# Patient Record
Sex: Female | Born: 1950
Health system: Southern US, Community
[De-identification: ages and names within clinical notes are randomized; demographics above are authoritative.]

## PROBLEM LIST (undated history)

## (undated) DIAGNOSIS — I2510 Atherosclerotic heart disease of native coronary artery without angina pectoris: Secondary | ICD-10-CM

## (undated) DIAGNOSIS — I700 Atherosclerosis of aorta: Secondary | ICD-10-CM

## (undated) DIAGNOSIS — K449 Diaphragmatic hernia without obstruction or gangrene: Secondary | ICD-10-CM

## (undated) DIAGNOSIS — Z923 Personal history of irradiation: Secondary | ICD-10-CM

## (undated) DIAGNOSIS — C801 Malignant (primary) neoplasm, unspecified: Secondary | ICD-10-CM

## (undated) DIAGNOSIS — I340 Nonrheumatic mitral (valve) insufficiency: Secondary | ICD-10-CM

## (undated) DIAGNOSIS — K219 Gastro-esophageal reflux disease without esophagitis: Secondary | ICD-10-CM

## (undated) DIAGNOSIS — R0609 Other forms of dyspnea: Secondary | ICD-10-CM

## (undated) DIAGNOSIS — C50919 Malignant neoplasm of unspecified site of unspecified female breast: Secondary | ICD-10-CM

## (undated) DIAGNOSIS — I959 Hypotension, unspecified: Secondary | ICD-10-CM

## (undated) DIAGNOSIS — Z853 Personal history of malignant neoplasm of breast: Secondary | ICD-10-CM

## (undated) DIAGNOSIS — Z8719 Personal history of other diseases of the digestive system: Secondary | ICD-10-CM

## (undated) DIAGNOSIS — E785 Hyperlipidemia, unspecified: Secondary | ICD-10-CM

## (undated) DIAGNOSIS — F410 Panic disorder [episodic paroxysmal anxiety] without agoraphobia: Secondary | ICD-10-CM

## (undated) DIAGNOSIS — E7800 Pure hypercholesterolemia, unspecified: Secondary | ICD-10-CM

## (undated) DIAGNOSIS — M8580 Other specified disorders of bone density and structure, unspecified site: Secondary | ICD-10-CM

## (undated) DIAGNOSIS — J40 Bronchitis, not specified as acute or chronic: Secondary | ICD-10-CM

## (undated) HISTORY — DX: Gastro-esophageal reflux disease without esophagitis: K21.9

## (undated) HISTORY — DX: Nonrheumatic mitral (valve) insufficiency: I34.0

## (undated) HISTORY — DX: Personal history of malignant neoplasm of breast: Z85.3

## (undated) HISTORY — DX: Atherosclerotic heart disease of native coronary artery without angina pectoris: I25.10

## (undated) HISTORY — DX: Other forms of dyspnea: R06.09

## (undated) HISTORY — DX: Malignant (primary) neoplasm, unspecified: C80.1

## (undated) HISTORY — DX: Hyperlipidemia, unspecified: E78.5

---

## 1995-01-26 DIAGNOSIS — C50919 Malignant neoplasm of unspecified site of unspecified female breast: Secondary | ICD-10-CM

## 1995-01-26 DIAGNOSIS — C801 Malignant (primary) neoplasm, unspecified: Secondary | ICD-10-CM

## 1995-01-26 HISTORY — PX: BREAST EXCISIONAL BIOPSY: SUR124

## 1995-01-26 HISTORY — DX: Malignant neoplasm of unspecified site of unspecified female breast: C50.919

## 1995-01-26 HISTORY — PX: BREAST SURGERY: SHX581

## 1995-01-26 HISTORY — DX: Malignant (primary) neoplasm, unspecified: C80.1

## 2003-11-04 ENCOUNTER — Ambulatory Visit: Payer: Self-pay | Admitting: Oncology

## 2003-11-26 ENCOUNTER — Ambulatory Visit: Payer: Self-pay | Admitting: Oncology

## 2004-02-12 ENCOUNTER — Ambulatory Visit: Payer: Self-pay | Admitting: General Surgery

## 2004-03-09 ENCOUNTER — Ambulatory Visit: Payer: Self-pay | Admitting: General Surgery

## 2004-03-09 LAB — HM COLONOSCOPY

## 2004-10-29 ENCOUNTER — Ambulatory Visit: Payer: Self-pay | Admitting: Oncology

## 2004-11-25 ENCOUNTER — Ambulatory Visit: Payer: Self-pay | Admitting: Oncology

## 2005-01-27 ENCOUNTER — Ambulatory Visit: Payer: Self-pay | Admitting: General Surgery

## 2005-02-18 ENCOUNTER — Other Ambulatory Visit: Payer: Self-pay

## 2005-02-18 ENCOUNTER — Emergency Department: Payer: Self-pay | Admitting: General Practice

## 2005-10-22 ENCOUNTER — Ambulatory Visit: Payer: Self-pay | Admitting: Oncology

## 2005-10-25 ENCOUNTER — Ambulatory Visit: Payer: Self-pay | Admitting: Oncology

## 2006-01-25 HISTORY — PX: COLONOSCOPY: SHX174

## 2006-01-26 ENCOUNTER — Ambulatory Visit: Payer: Self-pay | Admitting: General Surgery

## 2007-02-03 ENCOUNTER — Ambulatory Visit: Payer: Self-pay | Admitting: General Surgery

## 2007-06-09 ENCOUNTER — Inpatient Hospital Stay: Payer: Self-pay | Admitting: Unknown Physician Specialty

## 2007-06-11 ENCOUNTER — Other Ambulatory Visit: Payer: Self-pay

## 2008-02-05 ENCOUNTER — Ambulatory Visit: Payer: Self-pay | Admitting: General Surgery

## 2008-02-08 ENCOUNTER — Ambulatory Visit: Payer: Self-pay | Admitting: General Surgery

## 2009-02-05 ENCOUNTER — Ambulatory Visit: Payer: Self-pay | Admitting: General Surgery

## 2010-02-09 ENCOUNTER — Ambulatory Visit: Payer: Self-pay | Admitting: General Surgery

## 2010-03-06 ENCOUNTER — Ambulatory Visit: Payer: Self-pay

## 2010-08-05 ENCOUNTER — Ambulatory Visit: Payer: Self-pay

## 2011-02-11 ENCOUNTER — Ambulatory Visit: Payer: Self-pay | Admitting: General Surgery

## 2012-02-14 ENCOUNTER — Ambulatory Visit: Payer: Self-pay | Admitting: General Surgery

## 2012-02-28 ENCOUNTER — Emergency Department: Payer: Self-pay | Admitting: Emergency Medicine

## 2012-02-28 LAB — COMPREHENSIVE METABOLIC PANEL
Alkaline Phosphatase: 95 U/L (ref 50–136)
Anion Gap: 11 (ref 7–16)
Creatinine: 0.75 mg/dL (ref 0.60–1.30)
EGFR (African American): >60
Osmolality: 272 (ref 275–301)
Potassium: 4.1 mmol/L (ref 3.5–5.1)
Sodium: 136 mmol/L (ref 136–145)

## 2012-02-28 LAB — TSH: Thyroid Stimulating Horm: 1.63 u[IU]/mL

## 2012-02-28 LAB — URINALYSIS, COMPLETE
Bilirubin,UR: NEGATIVE
Blood: NEGATIVE
Nitrite: NEGATIVE
Ph: 6 (ref 4.5–8.0)
RBC,UR: 1 /HPF (ref 0–5)
Specific Gravity: 1.005 (ref 1.003–1.030)
Squamous Epithelial: 2
WBC UR: 1 /HPF (ref 0–5)

## 2012-02-28 LAB — CBC
HCT: 43.4 % (ref 35.0–47.0)
MCHC: 33.5 g/dL (ref 32.0–36.0)
RDW: 13.1 % (ref 11.5–14.5)

## 2012-02-28 LAB — DRUG SCREEN, URINE
Amphetamines, Ur Screen: NEGATIVE (ref ?–1000)
Cannabinoid 50 Ng, Ur ~~LOC~~: NEGATIVE (ref ?–50)
Cocaine Metabolite,Ur ~~LOC~~: NEGATIVE (ref ?–300)
MDMA (Ecstasy)Ur Screen: NEGATIVE (ref ?–500)
Opiate, Ur Screen: NEGATIVE (ref ?–300)
Tricyclic, Ur Screen: NEGATIVE (ref ?–1000)

## 2012-02-28 LAB — ETHANOL
Ethanol %: <0.003 % (ref 0.000–0.080)
Ethanol: <3 mg/dL

## 2012-03-02 ENCOUNTER — Inpatient Hospital Stay: Payer: Self-pay | Admitting: Psychiatry

## 2012-03-02 LAB — COMPREHENSIVE METABOLIC PANEL
Alkaline Phosphatase: 104 U/L (ref 50–136)
Anion Gap: 7 (ref 7–16)
BUN: 6 mg/dL — ABNORMAL LOW (ref 7–18)
Bilirubin,Total: 0.8 mg/dL (ref 0.2–1.0)
Calcium, Total: 9.7 mg/dL (ref 8.5–10.1)
Chloride: 103 mmol/L (ref 98–107)
Co2: 27 mmol/L (ref 21–32)
EGFR (African American): >60
EGFR (Non-African Amer.): >60
Osmolality: 272 (ref 275–301)
SGOT(AST): 27 U/L (ref 15–37)
Sodium: 137 mmol/L (ref 136–145)
Total Protein: 9 g/dL — ABNORMAL HIGH (ref 6.4–8.2)

## 2012-03-02 LAB — DRUG SCREEN, URINE
Amphetamines, Ur Screen: NEGATIVE
Barbiturates, Ur Screen: NEGATIVE
Benzodiazepine, Ur Scrn: NEGATIVE
Cannabinoid 50 Ng, Ur ~~LOC~~: NEGATIVE
Cocaine Metabolite,Ur ~~LOC~~: NEGATIVE
MDMA (Ecstasy)Ur Screen: NEGATIVE
Methadone, Ur Screen: NEGATIVE
Opiate, Ur Screen: NEGATIVE
Phencyclidine (PCP) Ur S: NEGATIVE
Tricyclic, Ur Screen: NEGATIVE

## 2012-03-02 LAB — CBC
HCT: 46.7 %
HGB: 15.9 g/dL
MCH: 30.5 pg
MCHC: 34 g/dL
MCV: 90 fL
Platelet: 414 x10 3/mm 3
RBC: 5.21 X10 6/mm 3 — ABNORMAL HIGH
RDW: 13.2 %
WBC: 9.1 x10 3/mm 3

## 2012-03-02 LAB — URINALYSIS, COMPLETE
Bilirubin,UR: NEGATIVE
Blood: NEGATIVE
Glucose,UR: NEGATIVE mg/dL
Ketone: NEGATIVE
Leukocyte Esterase: NEGATIVE
Nitrite: NEGATIVE
Ph: 6
Protein: NEGATIVE
RBC,UR: NONE SEEN /HPF
Specific Gravity: 1.001
Squamous Epithelial: NONE SEEN
WBC UR: NONE SEEN /HPF

## 2012-03-02 LAB — ACETAMINOPHEN LEVEL: Acetaminophen: <2 ug/mL

## 2012-03-02 LAB — ETHANOL
Ethanol %: <0.003 % (ref 0.000–0.080)
Ethanol: <3 mg/dL

## 2012-03-02 LAB — TSH: Thyroid Stimulating Horm: 1.9 u[IU]/mL

## 2012-05-23 LAB — HM PAP SMEAR

## 2012-07-26 ENCOUNTER — Encounter: Payer: Self-pay | Admitting: *Deleted

## 2013-02-27 ENCOUNTER — Ambulatory Visit: Payer: Self-pay | Admitting: General Surgery

## 2013-02-28 ENCOUNTER — Encounter: Payer: Self-pay | Admitting: General Surgery

## 2013-03-12 ENCOUNTER — Encounter: Payer: Self-pay | Admitting: General Surgery

## 2013-03-12 ENCOUNTER — Ambulatory Visit (INDEPENDENT_AMBULATORY_CARE_PROVIDER_SITE_OTHER): Payer: Federal, State, Local not specified - PPO | Admitting: General Surgery

## 2013-03-12 VITALS — BP 118/76 | HR 76 | Resp 12 | Ht 66.0 in | Wt 194.0 lb

## 2013-03-12 DIAGNOSIS — Z853 Personal history of malignant neoplasm of breast: Secondary | ICD-10-CM | POA: Insufficient documentation

## 2013-03-12 NOTE — Patient Instructions (Signed)
Patient to return in 1 year with a Bilateral screening mammogram. Patient to continue self breast checks. Patient to call with any new questions or concerns.

## 2013-03-12 NOTE — Progress Notes (Signed)
Patient ID: Chelsey [name], female   DOB: 05-09-1950, 63 y.o.   MRN: [id number]  Chief Complaint  Patient presents with  . Follow-up    1 year follow up bilateral diagnostic mammogram. History of breast cancer    HPI Chelsey [name] is a 63 y.o. female who presents for a breast cancer follow up. The most recent mammogram was done on 02/27/13. She does do regular breast checks and gets regular mammograms. No new complaints at this time.   HPI  Past Medical History  Diagnosis Date  . GERD (gastroesophageal reflux disease)   . Personal history of malignant neoplasm of breast   . Hyperlipidemia   . Cancer 1997    right lumpectomy,L/Ad/R     Past Surgical History  Procedure Laterality Date  . Colonoscopy  2008  . Breast surgery Right 1997    lumpectomy    History reviewed. No pertinent family history.  Social History History  Substance Use Topics  . Smoking status: Former Smoker -- 5 years  . Smokeless tobacco: Never Used  . Alcohol Use: No    No Known Allergies  Current Outpatient Prescriptions  Medication Sig Dispense Refill  . alendronate (FOSAMAX) 70 MG tablet Take 1 tablet by mouth once a week.      Marland Kitchen aspirin 81 MG chewable tablet Chew 81 mg by mouth daily.      . Calcium Carbonate-Vitamin D (CALCIUM + D PO) Take 1 tablet by mouth daily.      . mirtazapine (REMERON) 30 MG tablet Take 1 tablet by mouth daily.      . Multiple Vitamin (MULTIVITAMIN) tablet Take 1 tablet by mouth daily.      Marland Kitchen omeprazole (PRILOSEC) 20 MG capsule Take 1 capsule by mouth daily.      . simvastatin (ZOCOR) 20 MG tablet Take 1 tablet by mouth daily.       No current facility-administered medications for this visit.    Review of Systems Review of Systems  Constitutional: Negative.   Respiratory: Negative.   Cardiovascular: Negative.     Blood pressure 118/76, pulse 76, resp. rate 12, height 5\' 6"  (1.676 m), weight 194 lb (87.998 kg).  Physical Exam Physical Exam  Constitutional:  She is oriented to person, place, and time. She appears well-developed and well-nourished.  Eyes: Conjunctivae are normal. No scleral icterus.  Neck: Neck supple. No thyromegaly present.  Cardiovascular: Normal rate, regular rhythm, normal heart sounds and normal pulses.   No murmur heard. Pulses:      Dorsalis pedis pulses are 2+ on the right side, and 2+ on the left side.       Posterior tibial pulses are 2+ on the left side.  No edema present. No stasis changes  Pulmonary/Chest: Effort normal and breath sounds normal. Right breast exhibits no inverted nipple, no mass, no nipple discharge, no skin change and no tenderness. Left breast exhibits no inverted nipple, no mass, no nipple discharge, no skin change and no tenderness.  Well healed right breast lumpectomy site.   Abdominal: Soft. Normal appearance and bowel sounds are normal. There is no hepatosplenomegaly. There is no tenderness. No hernia.  Lymphadenopathy:    She has no cervical adenopathy.    She has no axillary adenopathy.  Neurological: She is alert and oriented to person, place, and time. She has normal strength. No sensory deficit.  Skin: Skin is warm and dry.    Data Reviewed Mammogram-stable.  Assessment    History of right breast  cancer-1997, s/p lumpectomy, ax dissection and radiation. Stable exam.     Plan    1 yr follow up.        Chelsey [name] 03/12/2013, 9:52 AM

## 2013-03-15 ENCOUNTER — Encounter: Payer: Self-pay | Admitting: General Surgery

## 2013-05-31 ENCOUNTER — Emergency Department: Payer: Self-pay | Admitting: Emergency Medicine

## 2013-05-31 LAB — DRUG SCREEN, URINE

## 2013-05-31 LAB — COMPREHENSIVE METABOLIC PANEL
ALBUMIN: 3.5 g/dL (ref 3.4–5.0)
Alkaline Phosphatase: 89 U/L
Anion Gap: 6 — ABNORMAL LOW (ref 7–16)
BILIRUBIN TOTAL: 0.6 mg/dL (ref 0.2–1.0)
BUN: 15 mg/dL (ref 7–18)
CALCIUM: 8.9 mg/dL (ref 8.5–10.1)
CREATININE: 0.77 mg/dL (ref 0.60–1.30)
Chloride: 108 mmol/L — ABNORMAL HIGH (ref 98–107)
Co2: 24 mmol/L (ref 21–32)
GLUCOSE: 115 mg/dL — AB (ref 65–99)
OSMOLALITY: 277 (ref 275–301)
Potassium: 3.6 mmol/L (ref 3.5–5.1)
SGOT(AST): 23 U/L (ref 15–37)
SGPT (ALT): 18 U/L (ref 12–78)
Sodium: 138 mmol/L (ref 136–145)
TOTAL PROTEIN: 7.4 g/dL (ref 6.4–8.2)

## 2013-05-31 LAB — ACETAMINOPHEN LEVEL: Acetaminophen: <2 ug/mL

## 2013-05-31 LAB — SALICYLATE LEVEL: Salicylates, Serum: 4 mg/dL — ABNORMAL HIGH

## 2013-05-31 LAB — TSH: Thyroid Stimulating Horm: 1.43 u[IU]/mL

## 2013-05-31 LAB — CBC
HCT: 38.7 % (ref 35.0–47.0)
HGB: 12.4 g/dL (ref 12.0–16.0)
MCH: 27.6 pg (ref 26.0–34.0)
MCHC: 32.1 g/dL (ref 32.0–36.0)
MCV: 86 fL (ref 80–100)
Platelet: 288 10*3/uL (ref 150–440)
RBC: 4.51 10*6/uL (ref 3.80–5.20)
RDW: 15.8 % — ABNORMAL HIGH (ref 11.5–14.5)
WBC: 10.4 10*3/uL (ref 3.6–11.0)

## 2013-05-31 LAB — ETHANOL
Ethanol %: <0.003 % (ref 0.000–0.080)
Ethanol: <3 mg/dL

## 2013-11-26 ENCOUNTER — Encounter: Payer: Self-pay | Admitting: General Surgery

## 2014-02-28 ENCOUNTER — Encounter: Payer: Self-pay | Admitting: General Surgery

## 2014-02-28 ENCOUNTER — Ambulatory Visit: Payer: Self-pay | Admitting: General Surgery

## 2014-03-12 ENCOUNTER — Ambulatory Visit (INDEPENDENT_AMBULATORY_CARE_PROVIDER_SITE_OTHER): Payer: Federal, State, Local not specified - PPO | Admitting: General Surgery

## 2014-03-12 ENCOUNTER — Encounter: Payer: Self-pay | Admitting: General Surgery

## 2014-03-12 VITALS — BP 124/70 | HR 74 | Resp 12 | Ht 66.0 in | Wt 196.0 lb

## 2014-03-12 DIAGNOSIS — Z853 Personal history of malignant neoplasm of breast: Secondary | ICD-10-CM

## 2014-03-12 NOTE — Patient Instructions (Signed)
Patient will be asked to return to the office in one year with a bilateral screening mammogram. 

## 2014-03-12 NOTE — Progress Notes (Signed)
Patient ID: Chelsey [name], female   DOB: 09-19-1950, 64 y.o.   MRN: [id number]  Chief Complaint  Patient presents with  . Other    Mammmogram    HPI Chelsey [name] is a 63 y.o. female who presents for a breast cancer follow up. The most recent mammogram was done on 03/05/14. Marland Kitchen  Patient does perform regular self breast checks and gets regular mammograms done.  Few days ago she cut her left thumb at work. Wanted to heve me check it. No other problems in last yr  HPI  Past Medical History  Diagnosis Date  . GERD (gastroesophageal reflux disease)   . Personal history of malignant neoplasm of breast   . Hyperlipidemia   . Cancer 1997    right lumpectomy,L/Ad/R     Past Surgical History  Procedure Laterality Date  . Colonoscopy  2008  . Breast surgery Right 1997    lumpectomy    History reviewed. No pertinent family history.  Social History History  Substance Use Topics  . Smoking status: Former Smoker -- 5 years  . Smokeless tobacco: Never Used  . Alcohol Use: No    No Known Allergies  Current Outpatient Prescriptions  Medication Sig Dispense Refill  . alendronate (FOSAMAX) 70 MG tablet Take 1 tablet by mouth once a week.    Marland Kitchen aspirin 81 MG chewable tablet Chew 81 mg by mouth daily.    . Calcium Carbonate-Vitamin D (CALCIUM + D PO) Take 1 tablet by mouth daily.    . mirtazapine (REMERON) 30 MG tablet Take 1 tablet by mouth daily.    . Multiple Vitamin (MULTIVITAMIN) tablet Take 1 tablet by mouth daily.    Marland Kitchen omeprazole (PRILOSEC) 20 MG capsule Take 1 capsule by mouth daily.    . simvastatin (ZOCOR) 20 MG tablet Take 1 tablet by mouth daily.    . traZODone (DESYREL) 100 MG tablet Take 100 mg by mouth at bedtime.     No current facility-administered medications for this visit.    Review of Systems Review of Systems  Constitutional: Negative.   Respiratory: Negative.   Cardiovascular: Negative.     Blood pressure 124/70, pulse 74, resp. rate 12, height 5\' 6"   (1.676 m), weight 196 lb (88.905 kg).  Physical Exam Physical Exam  Constitutional: She is oriented to person, place, and time. She appears well-developed and well-nourished.  Eyes: Conjunctivae are normal. No scleral icterus.  Neck: Neck supple.  Cardiovascular: Normal rate, regular rhythm and normal heart sounds.   Pulmonary/Chest: Effort normal and breath sounds normal. Right breast exhibits no inverted nipple, no mass, no nipple discharge, no skin change and no tenderness. Left breast exhibits no inverted nipple, no mass, no nipple discharge, no skin change and no tenderness.  Right lumpectomy site well healed incision   Abdominal: Soft. Bowel sounds are normal. There is no tenderness.  Lymphadenopathy:    She has no cervical adenopathy.    She has no axillary adenopathy.  Neurological: She is alert and oriented to person, place, and time.  Skin: Skin is warm and dry.  Left thumb with <1cm curved clean cut- no sign of infection.   Data Reviewed Mammogram reviewed-stable  Assessment    Stable exam, History of right breast cancer-1997, s/p lumpectomy, ax dissection and radiation. Left thumb minor laceration-should heal ok. Pt advised.    Plan    Patient will be asked to return to the office in one year with a bilateral screening mammogram.  SANKAR,SEEPLAPUTHUR G 03/12/2014, 10:09 AM

## 2014-05-17 NOTE — Consult Note (Signed)
PATIENT NAME:  Chelsey [name], Chelsey [name] MR#:  [id number] DATE OF BIRTH:  [birth year]  DATE OF CONSULTATION:  03/02/2012  REQUESTING PHYSICIAN:  Lenise Arena CONSULTING PHYSICIAN:  Cordelia Pen. Gretel Acre, MD  REASON FOR CONSULTATION: Depression with suicidal ideation.  HISTORY OF PRESENT ILLNESS: The patient is a 64 year old widowed white female who presented to the Emergency Room as she was feeling depressed and reported that she was having suicidal thoughts and stated that she lined up her pills  to take but then she came here instead.  She stated that she has a strong family history of depression and her brother killed himself on her sister's birthday years ago. The patient reported that she has been having severe depressive symptoms for the past few days. She reported that she was unable to sleep for the past 1-1/2 weeks. Reported that her depressive symptoms are getting worse. She was feeling hopeless, helpless, with worsening of the anxiety. She was unable to eat. She also started having physical symptoms and was unable to concentrate. She went to see her primary care physician and she was started on the combination of Paxil 10 mg and was given lorazepam to help her sleep. However, there was no improvement and she went back to her primary care physician who started her on the Ambien. The Ambien caused worsening of the symptoms. The patient came to the ER on Monday and was advised to go back and continue taking her medications. The patient reported that she feels like crying but was unable to cry. Her depressive symptoms got worse, with worsening of depression with increase in the suicidal ideations. She reported that she started having panic attacks and she came to the hospital as she felt that there was no way out. She wanted to end it all. She wanted to cry. The patient reported that she was seeking voluntary administration for mood stabilization and for safety.   PAST PSYCHIATRIC HISTORY: The patient stated  that she has history of previous hospitalization in 2009. At that time she was also was very depressed with feelings of hopelessness and helplessness. She was started on the combination of Remeron and Seroquel. She reported that she felt better on the combination of the medications, but after her discharge, she started seeing a female psychiatrist in [place] who gradually tapered her out of the medication. She reported that she has never tried to hurt herself.   MEDICAL HISTORY: The patient has history of hypercholesterolemia, as well as GERD.  PAST FAMILY HISTORY: The patient has history of suicide in her brother who killed himself on her sister's birthday.  Her aunt also killed herself. Her other brother has also attempted suicide.   SOCIAL HISTORY: The patient has a college degree and is currently widowed. She reported that her husband died due to carcinoma. She has 2 sons. Reported that her son is currently unemployed and she has to support him which is causing additional stress. The patient reported that her mother passed away recently. The patient stopped smoking but recently picked up a week ago. She denied using alcohol or other drugs.  CURRENT MEDICATIONS: Prilosec 20 mg daily, Boniva on a weekly basis.  PHYSICAL EXAMINATION: VITAL SIGNS:  Temperature 97.9, pulse 115, respirations 18, blood pressure 141/80.   Glucose 106, BUN 6, creatinine 0.77, sodium 137, potassium 4.2, chloride 103, bicarbonate 227, anion gap 7, osmolality 272, blood alcohol level less than 3. Protein 9.0, albumin 4.5, bilirubin is 0.8, alkaline phosphatase 104, AST 27, ALT 31. Thyroid stimulating hormone  1.9. Urine drug screen was negative. WBC 9.1, RBC 5.21, hemoglobin 15.9, hematocrit 46.7, platelet count 414, MCV 90, MCHC 34, RDW 13.2.  REVIEW OF SYSTEMS:  CONSTITUTIONAL: No fever or chills. No weight changes.  EYES:  No double or blurred vision.  RESPIRATORY:  No shortness of breath or cough.  CARDIOVASCULAR: No  chest pain, orthopnea. GASTROINTESTINAL  No abdominal pain, nausea, vomiting or diarrhea.  GENITOURINARY:  No incontinence or frequency.  ENDOCRINE: No heat or cold intolerance.  LYMPHATIC:  No anemia or easy bruising.  INTEGUMENTARY:  No acne or rash.  MUSCULOSKELETAL:  No pain or muscle pain.  NEUROLOGIC:  No tingling or weakness.   MSE: A moderately built female who appeared her stated age. She appeared somewhat anxious. Her speech was low in tone and volume. Mood was depressed. Affect was congruent. Thought process was logical and goal-directed. Thought content was nondelusional. She demonstrated fair insight and judgment. She has suicidal ideations with no specific plans  DIAGNOSTIC IMPRESSION: AXIS I:  Major depressive disorder, recurrent severe without psychotic features.   AXIS II:  None.   AXIS III:  Gastroesophageal reflux disease and hypercholesterolemia.   TREATMENT PLAN: 1.  The patient currently came to the hospital on voluntary admission to the behavioral health unit.  2.  She will be started on Remeron 15 mg p.o. at bedtime.  She agreed with the treatment plan. She will be continued on her other medications. Treatment team to follow.   Thank you for allowing me to participate in the care of this patient.      ____________________________ Cordelia Pen. Gretel Acre, MD usf:ce D: 03/02/2012 14:43:36 ET T: 03/02/2012 17:14:33 ET JOB#: [id number]  cc: Cordelia Pen. Gretel Acre, MD, <Dictator> Jeronimo Norma MD ELECTRONICALLY SIGNED 03/07/2012 9:39

## 2014-05-17 NOTE — H&P (Signed)
PATIENT NAME:  Chelsey [name], Chelsey [name] MR#:  [id number] DATE OF BIRTH:  August 06, 1950  DATE OF ADMISSION:  03/02/2012  DATE OF ASSESSMENT: 03/03/2012   REFERRING PHYSICIAN: Algis Liming. Jimmye Norman, MD  ACCEPTING PHYSICIAN: Cordelia Pen. Gretel Acre, MD  ATTENDING PHYSICIAN: Jolanta B. Pucilowska, MD  IDENTIFYING DATA: The patient is a 64 year old female with a history of depression.   CHIEF COMPLAINT: "I've been very depressed."   HISTORY OF PRESENT ILLNESS: The patient has a long history of depression. She was hospitalized at [facility] in 2009 for depression. For a long while, she continued on medications prescribed by her primary provider. Lately it was Paxil. Very gradually, the Paxil was discontinued. The patient did not have symptoms of Paxil withdrawal. However, with time she became increasingly depressed, and in the past few days she has been insomniac, depressed with passing thoughts of suicide without any intention or a plan. She has lost her appetite. She has not been able to concentrate and do her regular job of delivering mail. Last week, she went back to her primary provider who restarted the patient on Paxil 10 mg. The patient did start taking it. She denies psychotic symptoms. She denies symptoms suggestive of bipolar mania. There is no alcohol, illicit drug or prescription pill abuse. She endorsed many symptoms of anxiety along with symptoms of depression. She was prescribed at some point Ativan 0.5 mg at night for sleep.   PAST PSYCHIATRIC HISTORY: As above, she was hospitalized at [facility] in 2009. She was treated with a combination of Remeron and Celexa. She remembers good response to Paxil. Her chart discloses that she at some point was treated with Seroquel, and Seroquel in combination with the Remeron was working well. The patient is unable to identify stressors except for the fact that her son lost his job and now she is trying to help him pay his bills.    FAMILY PSYCHIATRIC HISTORY: There is a long history of depression in the family with several suicides. The patient's brother committed suicide on her sister's birthday. There is also an aunt who killed herself. Her other brother attempted suicide.   PAST MEDICAL HISTORY: Dyslipidemia, GERD.   ALLERGIES: No known drug allergies.   MEDICATIONS ON ADMISSION: Prilosec 20 mg daily, simvastatin 20 mg at night, Paxil 10 mg daily, multivitamin daily, aspirin 81 mg daily, Boniva on a weekly basis.   SOCIAL HISTORY: The patient is widowed. 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 She likes her job. She lives alone. She is trying to help her son. She has 2 children. The other son is doing okay. She used to smoke and last week started smoking again to her surprise.   REVIEW OF SYSTEMS:  CONSTITUTIONAL: No fevers or chills. No weight changes.  EYES: No double or blurred vision.  ENT: No hearing loss.  RESPIRATORY: No shortness of breath or cough.  CARDIOVASCULAR: No chest pain or orthopnea.  GASTROINTESTINAL: No abdominal pain, nausea, vomiting or diarrhea.  GENITOURINARY: No incontinence or frequency.  ENDOCRINE: No heat or cold intolerance.  LYMPHATIC: No anemia or easy bruising.  INTEGUMENTARY: No acne or rash.  MUSCULOSKELETAL: No muscle or joint pain.  NEUROLOGIC: No tingling or weakness.  PSYCHIATRIC: See history of present illness for details.   PHYSICAL EXAMINATION:  VITAL SIGNS: Blood pressure 119/83, pulse 95, respirations 20, temperature 98.1.  GENERAL: This is a well developed elderly female in no acute distress.  HEENT: The pupils are equal, round and reactive to  light. Sclerae anicteric.  NECK: Supple. No thyromegaly.  LUNGS: Clear to auscultation. No dullness to percussion.  HEART: Regular rhythm and rate. No murmurs, rubs or gallops.  ABDOMEN: Soft, nontender, nondistended. Positive bowel sounds.  MUSCULOSKELETAL: Normal muscle strength in all extremities.  SKIN: No rashes or  bruises.  LYMPHATIC: No cervical adenopathy.  NEUROLOGIC: Cranial nerves II through XII are intact.   LABORATORY DATA: Chemistries are within normal limits except for blood glucose of 114. Blood alcohol level is zero. LFTs within normal limits. TSH 1.9. Urine tox screen negative for substances. CBC within normal limits. Urinalysis is not suggestive of urinary tract infection. Serum acetaminophen and salicylates are low.   MENTAL STATUS EXAMINATION ON ADMISSION: The patient is alert and oriented to person, place, time and situation. She is pleasant, polite and cooperative. She is well groomed and casually dressed. She maintains good eye contact. Her speech is soft. Mood is depressed with anxious affect. Thought processing is logical. Thought content: She denies suicidal or homicidal ideation but admits to passing thoughts of suicide in the past few days. She has never attempted suicide and does not want to do it but is in the hospital for help. There are no delusions or paranoia. There are no auditory or visual hallucinations. Her cognition is grossly intact. She registers 3 out of 3 and recalls 3 out of 3 objects after 5 minutes. She can have spell "world" forward and backward. She knows the current president. Her insight and judgment are fair.   SUICIDE RISK ASSESSMENT ON ADMISSION: This is a patient with a history of depression who became depressed and suicidal in the context of antidepressant discontinuation and social stressors.   DIAGNOSES:  AXIS I: Major depressive disorder, recurrent, severe. Anxiety disorder, not otherwise specified.  AXIS II: Deferred.  AXIS III: Dyslipidemia, gastroesophageal reflux disease, osteopenia.  AXIS IV: Mental illness, recent medication changes, financial problems.  AXIS V: Global Assessment of Functioning on admission: 25.   PLAN: The patient was admitted to [facility] for safety, stabilization and medication  management. She was initially placed on suicide precautions and was closely monitored for any unsafe behaviors. She underwent full psychiatric and risk assessment. She received pharmacotherapy, individual and group psychotherapy, substance abuse counseling, and support from therapeutic milieu.  1.  Suicidal ideation: The patient is able to contract for safety.  2.  Mood: The patient believes that she did the best on the Remeron at one point and Paxil another. Last time when she was hospitalized here, she was discharged on a combination of Remeron and SSRI. Dr. Gretel Acre restarted Remeron at 15 mg already. We will increase her Paxil from 10 to 30 mg daily.  2.  Severe insomnia: The patient receive a dose of temazepam night and she slept some. In the past few days, she was prescribed Ambien for sleep and she believes that it made things worse. 3.  Medical: We will continue medications for GERD and her dyslipidemia along with her vitamins.  4.  Disposition: She will be discharged to home. She sees a Teacher, music in [place] and would like to see someone locally. We will try to help her with this.   ____________________________ Herma Ard B. Bary Leriche, MD jbp:jm D: 03/03/2012 14:53:24 ET T: 03/03/2012 15:36:46 ET JOB#: [id number]  cc: Jolanta B. Bary Leriche, MD, <Dictator> Clovis Fredrickson MD ELECTRONICALLY SIGNED 03/23/2012 6:41

## 2014-05-17 NOTE — Consult Note (Signed)
Brief Consult Note: Diagnosis: MDD.   Patient was seen by consultant.   Consult note dictated.   Recommend further assessment or treatment.   Orders entered.   Comments: Pt is going to be admitted to [facility].  she is Voluntary and seeking help for worsening depression, SI with no plans.  Will start on Remeron 15mg  po qhs.  Treatment team to follow.  Electronic Signatures: Jeronimo Norma (MD)  (Signed 06-Feb-14 14:01)  Authored: Brief Consult Note   Last Updated: 06-Feb-14 14:01 by Jeronimo Norma (MD)

## 2014-05-18 NOTE — Consult Note (Signed)
PATIENT NAME:  Chelsey [name], Chelsey [name] MR#:  [id number] DATE OF BIRTH:  1950/12/10  DATE OF CONSULTATION:  05/31/2013  REFERRING PHYSICIAN:  Graciella Freer, MD CONSULTING PHYSICIAN:  Cordelia Pen. Gretel Acre, MD  REASON FOR CONSULTATION:  Depression, suicidal ideations.   HISTORY OF PRESENT ILLNESS: The patient is a 64 year old female who presented to the ER and was placed on involuntary commitment, as she was having depressive feelings. She reported that she was unable to sleep for the past 3 days. She reported that she was having panic, anxiety, and she was hospitalized last year. Reported that she has been following with [facility], after she was discharged from the hospital and was taking her medications as prescribed. However, [facility] stopped taking her insurance since December. She was stable on the combination of Remeron, trazodone and hydroxyzine on a p.r.n. basis. Initially, she was taking  hydroxyzine twice daily, but gradually she has decreased it only to a p.r.n. basis. However, since [facility] stopped taking her insurance, she went to her primary care physician at Dr. Rance Muir office, and they switched her medication to Celexa. She reported that it was a horrible experience and she was unable to sleep. Since February, she has never taken any of her psychotropic medications and is unable to sleep. She came in voluntarily to the ER, as she was feeling very depressed, hopeless and was feeling like she might start having thoughts to hurt herself. However, she is not having any of these symptoms at this time. She wanted help for her depression. She reported that if she will be started back on her medications, she will feel better. She currently denied having any suicidal or homicidal ideations or plans.   PAST PSYCHIATRIC HISTORY: The patient has long history of depression, and she was last hospitalized in 2014 and 2009. At that time, she was very depressed with feelings of hopelessness and helplessness. She did well on  the combination of Remeron, trazodone and hydroxyzine. She has also tried Seroquel in the past. She followed with a female psychiatrist in [place] for many years. She does not have any history of suicide in the past.   PAST MEDICAL HISTORY:  Hypercholesterolemia and GERD.   FAMILY HISTORY:  The patient has history of suicide in her brother who killed himself. Her aunt has also kill herself. Her older brother attempted suicide.   SOCIAL HISTORY:  The patient has a college degree and is currently widowed. She currently works as a Dispensing optician. She reported that she works around 9 to 9-1/2 hours. She does not use any drugs or alcohol at this time.   CURRENT MEDICATIONS:  Remeron 30 mg at bedtime, trazodone 100 mg at bedtime and hydroxyzine 25 mg b.i.d. p.r.n.; however, she has not taken these medications for at least 3 months.   PHYSICAL EXAMINATION: VITAL SIGNS: Temperature 98.6, pulse 86, respirations 20, blood pressure 145/79.   LABORATORY DATA: Glucose 115, BUN 15, creatinine 0.77, sodium 138, potassium 3.6, chloride 108, bicarbonate 24, anion gap 6, osmolality 277. Blood alcohol level less than 3. Protein 7.4, albumin 3.5, bilirubin 0.6, alkaline phosphatase 89, AST 23, ALT 18. TSH 1.43. UDS is negative. WBC 10.4, RBC 4.51, hemoglobin 12.4, MCV 86, RDW 15.8.   REVIEW OF SYSTEMS:  Negative, except as noted above.   MENTAL STATUS EXAMINATION: The patient is a moderately-built female who appeared her stated age. She was calm and cooperative during the interview. She maintained good eye contact. Her speech was normal in tone and volume. Thought process was  logical, goal directed. Thought content was nondelusional. No loose associations are noted. Her insight and judgment were fair. Recent and remote memory were intact. Attention span and concentration were normal. Language and fund of knowledge appears appropriate. Mood was depressed and affect was congruent. She denied having any suicidal or homicidal  ideation or plans.   DIAGNOSTIC IMPRESSION: AXIS I: 1.  Major depressive disorder, recurrent, moderate.  2.  Anxiety disorder, not otherwise specified.  AXIS II:  None.  AXIS III:  As noted above.   TREATMENT PLAN: 1.  The patient will be given a prescription of her current medication as follows: Remeron 30 mg p.o. at bedtime, trazodone 100 mg p.o. at bedtime and hydroxyzine 25 mg p.o. b.i.d. p.r.n. #60.  2.  She will be released from the involuntary commitment, as she does not have any thoughts to hurt herself and does not meet the criteria.  3.  She will follow up with the [facility] and will call to make a followup appointment. I discussed with the patient at length and advised her to come back for a followup appointment if she notices worsening of her symptoms, and she demonstrated understanding.   Thank you for allowing me to participate in the care of this patient.   ____________________________ Cordelia Pen. Gretel Acre, MD usf:dmm D: 05/31/2013 10:33:06 ET T: 05/31/2013 11:10:19 ET JOB#: [id number]  cc: Cordelia Pen. Gretel Acre, MD, <Dictator> Jeronimo Norma MD ELECTRONICALLY SIGNED 06/07/2013 10:57

## 2014-06-13 DIAGNOSIS — Z8639 Personal history of other endocrine, nutritional and metabolic disease: Secondary | ICD-10-CM | POA: Insufficient documentation

## 2014-06-13 DIAGNOSIS — K219 Gastro-esophageal reflux disease without esophagitis: Secondary | ICD-10-CM | POA: Insufficient documentation

## 2014-06-13 DIAGNOSIS — F331 Major depressive disorder, recurrent, moderate: Secondary | ICD-10-CM | POA: Insufficient documentation

## 2014-08-13 ENCOUNTER — Ambulatory Visit: Payer: Self-pay | Admitting: Psychiatry

## 2014-08-26 ENCOUNTER — Ambulatory Visit (INDEPENDENT_AMBULATORY_CARE_PROVIDER_SITE_OTHER): Payer: Self-pay | Admitting: Psychiatry

## 2014-08-26 ENCOUNTER — Encounter: Payer: Self-pay | Admitting: Psychiatry

## 2014-08-26 VITALS — BP 122/86 | HR 86 | Temp 98.2°F | Ht 66.0 in | Wt 186.2 lb

## 2014-08-26 DIAGNOSIS — F330 Major depressive disorder, recurrent, mild: Secondary | ICD-10-CM

## 2014-08-26 MED ORDER — MIRTAZAPINE 30 MG PO TABS: 30.0000 mg | ORAL_TABLET | Freq: Every day | ORAL | Status: DC

## 2014-08-26 MED ORDER — TRAZODONE HCL 100 MG PO TABS: 100.0000 mg | ORAL_TABLET | Freq: Every day | ORAL | Status: DC

## 2014-08-26 MED ORDER — HYDROXYZINE HCL 25 MG PO TABS: 25.0000 mg | ORAL_TABLET | Freq: Three times a day (TID) | ORAL | Status: DC

## 2014-08-26 NOTE — Progress Notes (Signed)
BH MD/PA/NP OP Progress Note  08/26/2014 1:13 PM Chelsey [name]  MRN:  [id number]  Subjective:  Patient is a 64 year old female who presented for the follow-up appointment. She reported that she is becoming more anxious as her 28 years old son has moved in to her home and she is applying for bankruptcy due to his issues. She reported that he has history of alcohol use in the past and has some legal charges pending due to the same. She was trying to pay off his balances and it led to financial difficulties. 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 She has been taking more of the Atarax to control her anxiety. She is also sleeping well with the help of trazodone. She reported that she has lost weight as she does not eat well when she becomes anxious. Patient currently denied having any suicidal homicidal ideations or plans. She reported that her other son is very stable and is working. She is trying to improve her condition by applying for bankruptcy and to help her son.   Chief Complaint:  Chief Complaint    Follow-up; Depression     Visit Diagnosis:     ICD-9-CM ICD-10-CM   1. MDD (major depressive disorder), recurrent episode, mild 296.31 F33.0     Past Medical History:  Past Medical History  Diagnosis Date  . GERD (gastroesophageal reflux disease)   . Personal history of malignant neoplasm of breast   . Hyperlipidemia   . Cancer 1997    right lumpectomy,L/Ad/R     Past Surgical History  Procedure Laterality Date  . Colonoscopy  2008  . Breast surgery Right 1997    lumpectomy   Family History: No family history on file. Social History:  History   Social History  . Marital Status: Widowed    Spouse Name: N/A  . Number of Children: N/A  . Years of Education: N/A   Social History Main Topics  . Smoking status: Former Smoker -- 5 years  . Smokeless tobacco: Never Used  . Alcohol Use: No  . Drug Use: No  .  Sexual Activity: Not on file   Other Topics Concern  . Not on file   Social History Narrative   Additional History:  Currently her son is living with her. 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  Assessment:   Musculoskeletal: Strength & Muscle Tone: within normal limits Gait & Station: normal Patient leans: N/A  Psychiatric Specialty Exam: HPI  ROS  There were no vitals taken for this visit.There is no weight on file to calculate BMI.  General Appearance: Casual  Eye Contact:  Good  Speech:  Clear and Coherent  Volume:  Normal  Mood:  Anxious and Depressed  Affect:  Congruent  Thought Process:  Coherent  Orientation:  Full (Time, Place, and Person)  Thought Content:  WDL  Suicidal Thoughts:  No  Homicidal Thoughts:  No  Memory:  Immediate;   Fair  Judgement:  Fair  Insight:  Fair  Psychomotor Activity:  Decreased  Concentration:  Fair  Recall:  [employer] of Knowledge: Fair  Language: Fair  Akathisia:  No  Handed:  Right  AIMS (if indicated):    Assets:  Communication Skills Desire for Improvement Social Support  ADL's:  Intact  Cognition: WNL  Sleep: 6- 7 with trazodone    Is the patient at risk to self?  No. Has the patient been a risk to self in the past 6 months?  No. Has the patient been a risk to self within the distant past?  No. Is the patient a risk to others?  No. Has the patient been a risk to others in the past 6 months?  No. Has the patient been a risk to others within the distant past?  No.  Current Medications: Current Outpatient Prescriptions  Medication Sig Dispense Refill  . alendronate (FOSAMAX) 70 MG tablet Take 1 tablet by mouth once a week.    Marland Kitchen aspirin 81 MG chewable tablet Chew 81 mg by mouth daily.    . Calcium Carbonate-Vitamin D (CALCIUM + D PO) Take 1 tablet by mouth daily.    . hydrOXYzine (ATARAX/VISTARIL) 25 MG tablet Take 1 tablet by mouth at bedtime.    . mirtazapine (REMERON) 30 MG tablet Take 1  tablet by mouth daily.    . Multiple Vitamin (MULTIVITAMIN) tablet Take 1 tablet by mouth daily.    Marland Kitchen omeprazole (PRILOSEC) 20 MG capsule Take 1 capsule by mouth daily.    . simvastatin (ZOCOR) 20 MG tablet Take 1 tablet by mouth daily.    . traZODone (DESYREL) 100 MG tablet Take 100 mg by mouth at bedtime.     No current facility-administered medications for this visit.    Medical Decision Making:  Review of Psycho-Social Stressors (1) and Review and summation of old records (2)  Treatment Plan Summary:Medication management  She will continue on Atarax 25 mg by mouth 3 times a day when necessary. She will also continue on Remeron 30 mg by mouth daily at bedtime and trazodone 100 mg by mouth daily at bedtime for insomnia.   More than 50% of the time spent in psychoeducation, counseling and coordination of care.    This note was generated in part or whole with voice recognition software. Voice regonition is usually quite accurate but there are transcription errors that can and very often do occur. I apologize for any typographical errors that were not detected and corrected.    Rainey Pines 08/26/2014, 1:13 PM

## 2014-11-26 ENCOUNTER — Encounter: Payer: Self-pay | Admitting: Psychiatry

## 2014-11-26 ENCOUNTER — Ambulatory Visit (INDEPENDENT_AMBULATORY_CARE_PROVIDER_SITE_OTHER): Payer: Federal, State, Local not specified - PPO | Admitting: Psychiatry

## 2014-11-26 VITALS — BP 122/86 | HR 91 | Temp 97.4°F | Ht 66.0 in | Wt 194.0 lb

## 2014-11-26 DIAGNOSIS — F330 Major depressive disorder, recurrent, mild: Secondary | ICD-10-CM | POA: Diagnosis not present

## 2014-11-26 MED ORDER — TRAZODONE HCL 100 MG PO TABS: 100.0000 mg | ORAL_TABLET | Freq: Every day | ORAL | Status: DC

## 2014-11-26 MED ORDER — MIRTAZAPINE 30 MG PO TABS: 30.0000 mg | ORAL_TABLET | Freq: Every day | ORAL | Status: DC

## 2014-11-26 MED ORDER — HYDROXYZINE HCL 25 MG PO TABS: 25.0000 mg | ORAL_TABLET | Freq: Every day | ORAL | Status: DC

## 2014-11-26 NOTE — Progress Notes (Signed)
BH MD/PA/NP OP Progress Note  11/26/2014 10:33 AM Chelsey [name]  MRN:  [id number]  Subjective:  Patient is a 64 year old female who presented for the follow-up appointment. She reported that she ran out of her mirtazapine couple of days ago and was unable to get the refill. She reported that she started taking trazodone as she was not able to sleep. Patient stated that she is able to get her bankruptcy under control and is now doing the payments on a regular basis. Patient reported that her medications have been helping her. Patient reported that she is feeling calm and helping her son moved out of the house. She currently denied having any depressive symptoms and stated that her medications are helping with depression and anxiety and paranoia. She currently denied having any suicidal homicidal ideations or plans. She appeared calm during the interview.  Chief Complaint:  Chief Complaint    Follow-up; Medication Refill     Visit Diagnosis:     ICD-9-CM ICD-10-CM   1. Mild episode of recurrent major depressive disorder ([place]) 296.31 F33.0     Past Medical History:  Past Medical History  Diagnosis Date  . GERD (gastroesophageal reflux disease)   . Personal history of malignant neoplasm of breast   . Hyperlipidemia   . Cancer [facility]) 1997    right lumpectomy,L/Ad/R     Past Surgical History  Procedure Laterality Date  . Colonoscopy  2008  . Breast surgery Right 1997    lumpectomy   Family History:  Family History  Problem Relation Age of Onset  . Anxiety disorder Brother   . Obesity Brother   . COPD Brother   . Alcohol abuse Son   . Kidney disease Mother   . Heart attack Father   . Hypertension Father   . Alcohol abuse Father   . Depression Brother   . Anxiety disorder Brother    Social History:  Social History   Social History  . Marital Status: Widowed    Spouse Name: N/A  . Number of Children: N/A  . Years of Education: N/A   Social History Main Topics  .  Smoking status: Current Some Day Smoker -- 1.00 packs/day for 5 years    Types: Cigarettes    Start date: 11/25/2009  . Smokeless tobacco: Never Used  . Alcohol Use: 0.6 oz/week    1 Cans of beer, 0 Glasses of wine, 0 Shots of liquor per week     Comment: beer once a month  . Drug Use: No  . Sexual Activity: No   Other Topics Concern  . None   Social History Narrative   Additional History:  Currently her son is living with her and patient is trying to help him move out into an apartment. 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  Assessment:   Musculoskeletal: Strength & Muscle Tone: within normal limits Gait & Station: normal Patient leans: N/A  Psychiatric Specialty Exam: HPI   ROS   Blood pressure 122/86, pulse 91, temperature 97.4 F (36.3 C), temperature source Tympanic, height 5\' 6"  (1.676 m), weight 194 lb (87.998 kg), SpO2 97 %.Body mass index is 31.33 kg/(m^2).  General Appearance: Casual  Eye Contact:  Good  Speech:  Clear and Coherent  Volume:  Normal  Mood:  Euthymic  Affect:  Congruent  Thought Process:  Coherent  Orientation:  Full (Time, Place, and Person)  Thought Content:  WDL  Suicidal Thoughts:  No  Homicidal  Thoughts:  No  Memory:  Immediate;   Fair  Judgement:  Fair  Insight:  Fair  Psychomotor Activity:  Decreased  Concentration:  Fair  Recall:  [employer] of Knowledge: Fair  Language: Fair  Akathisia:  No  Handed:  Right  AIMS (if indicated):    Assets:  Communication Skills Desire for Improvement Social Support  ADL's:  Intact  Cognition: WNL  Sleep: 6- 7 with trazodone    Is the patient at risk to self?  No. Has the patient been a risk to self in the past 6 months?  No. Has the patient been a risk to self within the distant past?  No. Is the patient a risk to others?  No. Has the patient been a risk to others in the past 6 months?  No. Has the patient been a risk to others within the distant past?   No.  Current Medications: Current Outpatient Prescriptions  Medication Sig Dispense Refill  . alendronate (FOSAMAX) 70 MG tablet Take 1 tablet by mouth once a week.    Marland Kitchen aspirin 81 MG chewable tablet Chew 81 mg by mouth daily.    . Calcium Carbonate-Vitamin D (CALCIUM + D PO) Take 1 tablet by mouth daily.    . hydrOXYzine (ATARAX/VISTARIL) 25 MG tablet Take 1 tablet (25 mg total) by mouth 3 (three) times daily. 90 tablet 2  . mirtazapine (REMERON) 30 MG tablet Take 1 tablet (30 mg total) by mouth daily. 30 tablet 2  . Multiple Vitamin (MULTIVITAMIN) tablet Take 1 tablet by mouth daily.    Marland Kitchen omeprazole (PRILOSEC) 20 MG capsule Take 1 capsule by mouth daily.    . simvastatin (ZOCOR) 20 MG tablet Take 1 tablet by mouth daily.    . traZODone (DESYREL) 100 MG tablet Take 1 tablet (100 mg total) by mouth at bedtime. 30 tablet 2   No current facility-administered medications for this visit.    Medical Decision Making:  Review of Psycho-Social Stressors (1) and Review and summation of old records (2)  Treatment Plan Summary:Medication management   Depression  She will also continue on Remeron 30 mg by mouth daily at bedtime and trazodone 100 mg by mouth daily at bedtime for insomnia. She also takes Atarax 25 mg daily at bedtime when necessary   More than 50% of the time spent in psychoeducation, counseling and coordination of care.    Time spent with the patient 25 minutes   This note was generated in part or whole with voice recognition software. Voice regonition is usually quite accurate but there are transcription errors that can and very often do occur. I apologize for any typographical errors that were not detected and corrected.    Rainey Pines 11/26/2014, 10:33 AM

## 2015-01-09 ENCOUNTER — Other Ambulatory Visit: Payer: Self-pay | Admitting: *Deleted

## 2015-01-09 DIAGNOSIS — Z1231 Encounter for screening mammogram for malignant neoplasm of breast: Secondary | ICD-10-CM

## 2015-02-26 ENCOUNTER — Other Ambulatory Visit: Payer: Self-pay

## 2015-02-26 MED ORDER — MIRTAZAPINE 30 MG PO TABS: 30.0000 mg | ORAL_TABLET | Freq: Every day | ORAL | Status: DC

## 2015-02-26 NOTE — Telephone Encounter (Signed)
pt will not have enought to do until appt pt last seen on  11-26-14 next appt  03-10-15.  dr. Gretel Acre patient she is on vacaction this week can you approved for 13 tablets

## 2015-02-26 NOTE — Telephone Encounter (Signed)
Covering psychiatrist reviewed most recent note and patient has been prescribe Remeron. It does appear she would be due for this medication. I will order a enough, 17 tablets to bridge patient until her next follow-up appointment. AW

## 2015-02-27 ENCOUNTER — Ambulatory Visit: Payer: Federal, State, Local not specified - PPO | Admitting: Psychiatry

## 2015-03-03 ENCOUNTER — Ambulatory Visit
Admission: RE | Admit: 2015-03-03 | Discharge: 2015-03-03 | Disposition: A | Discharge status: Home / Self Care | Payer: Federal, State, Local not specified - PPO | Source: Ambulatory Visit | Attending: General Surgery | Admitting: General Surgery

## 2015-03-03 DIAGNOSIS — Z853 Personal history of malignant neoplasm of breast: Secondary | ICD-10-CM | POA: Insufficient documentation

## 2015-03-03 DIAGNOSIS — Z1231 Encounter for screening mammogram for malignant neoplasm of breast: Secondary | ICD-10-CM | POA: Insufficient documentation

## 2015-03-03 HISTORY — DX: Malignant neoplasm of unspecified site of unspecified female breast: C50.919

## 2015-03-10 ENCOUNTER — Ambulatory Visit (INDEPENDENT_AMBULATORY_CARE_PROVIDER_SITE_OTHER): Payer: Federal, State, Local not specified - PPO | Admitting: General Surgery

## 2015-03-10 ENCOUNTER — Ambulatory Visit: Payer: Federal, State, Local not specified - PPO | Admitting: Psychiatry

## 2015-03-10 ENCOUNTER — Encounter: Payer: Self-pay | Admitting: General Surgery

## 2015-03-10 VITALS — BP 130/70 | HR 72 | Resp 12 | Ht 66.0 in | Wt 184.0 lb

## 2015-03-10 DIAGNOSIS — Z853 Personal history of malignant neoplasm of breast: Secondary | ICD-10-CM | POA: Diagnosis not present

## 2015-03-10 NOTE — Patient Instructions (Signed)
Patient will be asked to return to the office in one year with a bilateral screening mammogram. 

## 2015-03-10 NOTE — Progress Notes (Signed)
Patient ID: Chelsey [name], female   DOB: 1950/12/07, 65 y.o.   MRN: [id number]  Chief Complaint  Patient presents with  . Follow-up    mammogram    HPI Chelsey [name] is a 65 y.o. female who presents for a breast cancer follow up. The most recent mammogram was done on .  Patient does perform regular self breast checks and gets regular mammograms done.   I have reviewed the history of present illness with the patient.  HPI  Past Medical History  Diagnosis Date  . GERD (gastroesophageal reflux disease)   . Personal history of malignant neoplasm of breast   . Hyperlipidemia   . Cancer [facility]) 1997    right lumpectomy,L/Ad/R   . Breast cancer [facility]) 1997    right breast, radiation    Past Surgical History  Procedure Laterality Date  . Colonoscopy  2008  . Breast surgery Right 1997    lumpectomy  . Breast excisional biopsy Right 1997    pos    Family History  Problem Relation Age of Onset  . Anxiety disorder Brother   . Obesity Brother   . COPD Brother   . Alcohol abuse Son   . Kidney disease Mother   . Heart attack Father   . Hypertension Father   . Alcohol abuse Father   . Depression Brother   . Anxiety disorder Brother   . Breast cancer Maternal Grandmother     Social History Social History  Substance Use Topics  . Smoking status: Current Some Day Smoker -- 1.00 packs/day for 5 years    Types: Cigarettes    Start date: 11/25/2009  . Smokeless tobacco: Never Used  . Alcohol Use: 0.6 oz/week    1 Cans of beer, 0 Glasses of wine, 0 Shots of liquor per week     Comment: beer once a month    No Known Allergies  Current Outpatient Prescriptions  Medication Sig Dispense Refill  . alendronate (FOSAMAX) 70 MG tablet Take 1 tablet by mouth once a week.    Marland Kitchen aspirin 81 MG chewable tablet Chew 81 mg by mouth daily.    . Calcium Carbonate-Vitamin D (CALCIUM + D PO) Take 1 tablet by mouth daily.    . hydrOXYzine (ATARAX/VISTARIL) 25 MG tablet Take 1 tablet (25 mg  total) by mouth at bedtime. 30 tablet 2  . mirtazapine (REMERON) 30 MG tablet Take 1 tablet (30 mg total) by mouth daily. 17 tablet 0  . Multiple Vitamin (MULTIVITAMIN) tablet Take 1 tablet by mouth daily.    Marland Kitchen omeprazole (PRILOSEC) 20 MG capsule Take 1 capsule by mouth daily.    . simvastatin (ZOCOR) 20 MG tablet Take 1 tablet by mouth daily.     No current facility-administered medications for this visit.    Review of Systems Review of Systems  Constitutional: Negative.   Respiratory: Negative.   Cardiovascular: Negative.     Blood pressure 130/70, pulse 72, resp. rate 12, height 5\' 6"  (1.676 m), weight 184 lb (83.462 kg).  Physical Exam Physical Exam  Constitutional: She is oriented to person, place, and time. She appears well-developed and well-nourished.  Eyes: Conjunctivae are normal. No scleral icterus.  Neck: Neck supple.  Cardiovascular: Normal rate and normal heart sounds.   Pulmonary/Chest: Effort normal and breath sounds normal. Right breast exhibits no inverted nipple, no mass, no nipple discharge, no skin change and no tenderness. Left breast exhibits no inverted nipple, no mass, no nipple discharge, no skin change  and no tenderness.  Abdominal: Bowel sounds are normal. There is no hepatomegaly. There is no tenderness.  Lymphadenopathy:    She has no cervical adenopathy.    She has no axillary adenopathy.  Neurological: She is alert and oriented to person, place, and time.  Skin: Skin is warm and dry.    Data Reviewed Mammogram reviewed  Assessment    Stable exam, History of right breast cancer-1997, s/p lumpectomy, ax dissection and radiation    Plan    Patient will be asked to return to the office in one year with a bilateral screening mammogram.     PCP:  [facility] A This information has been scribed by Gaspar Cola CMA.   Yuto Cajuste G 03/11/2015, 1:36 PM

## 2015-03-11 ENCOUNTER — Ambulatory Visit (INDEPENDENT_AMBULATORY_CARE_PROVIDER_SITE_OTHER): Payer: Federal, State, Local not specified - PPO | Admitting: Psychiatry

## 2015-03-11 ENCOUNTER — Encounter: Payer: Self-pay | Admitting: Psychiatry

## 2015-03-11 ENCOUNTER — Encounter: Payer: Self-pay | Admitting: General Surgery

## 2015-03-11 VITALS — BP 118/68 | HR 99 | Temp 98.2°F | Ht 66.0 in | Wt 184.8 lb

## 2015-03-11 DIAGNOSIS — F330 Major depressive disorder, recurrent, mild: Secondary | ICD-10-CM | POA: Diagnosis not present

## 2015-03-11 MED ORDER — HYDROXYZINE HCL 25 MG PO TABS: 25.0000 mg | ORAL_TABLET | Freq: Every day | ORAL | Status: DC

## 2015-03-11 MED ORDER — MIRTAZAPINE 30 MG PO TABS: 30.0000 mg | ORAL_TABLET | Freq: Every day | ORAL | Status: DC

## 2015-03-11 NOTE — Progress Notes (Signed)
BH MD/PA/NP OP Progress Note  03/11/2015 1:47 PM Chelsey [name]  MRN:  [id number]  Subjective:  Patient is a 65 year old female who presented for the follow-up appointment. She she reported that she has been doing well on the medication. Patient currently takes the mirtazapine  on a regular basis. Patient stated that she has decrease the use of trazodone and has not taken it since Thanksgiving. She also takes hydroxyzine on a when necessary basis. She stated that her work is still very busy and she has been delivering mail on a regular basis. She reported that she has been compliant with her medication and has not noticed any worsening of her symptoms.  Patient reported that she is feeling calm and has applied for bankruptcy  and is paying  on a monthly basis.She currently denied having any depressive symptoms and stated that her medications are helping with depression and anxiety and paranoia. She currently denied having any suicidal homicidal ideations or plans. She appeared calm during the interview.  Chief Complaint:  Chief Complaint    Follow-up; Medication Refill     Visit Diagnosis:     ICD-9-CM ICD-10-CM   1. Mild episode of recurrent major depressive disorder ([place]) 296.31 F33.0     Past Medical History:  Past Medical History  Diagnosis Date  . GERD (gastroesophageal reflux disease)   . Personal history of malignant neoplasm of breast   . Hyperlipidemia   . Cancer [facility]) 1997    right lumpectomy,L/Ad/R   . Breast cancer [facility]) 1997    right breast, radiation    Past Surgical History  Procedure Laterality Date  . Colonoscopy  2008  . Breast surgery Right 1997    lumpectomy  . Breast excisional biopsy Right 1997    pos   Family History:  Family History  Problem Relation Age of Onset  . Anxiety disorder Brother   . Obesity Brother   . COPD Brother   . Alcohol abuse Son   . Kidney disease Mother   . Heart attack Father   . Hypertension Father   . Alcohol abuse Father    . Depression Brother   . Anxiety disorder Brother   . Breast cancer Maternal Grandmother    Social History:  Social History   Social History  . Marital Status: Widowed    Spouse Name: N/A  . Number of Children: N/A  . Years of Education: N/A   Social History Main Topics  . Smoking status: Current Some Day Smoker -- 1.00 packs/day for 5 years    Types: Cigarettes    Start date: 11/25/2009  . Smokeless tobacco: Never Used  . Alcohol Use: 0.6 oz/week    1 Cans of beer, 0 Glasses of wine, 0 Shots of liquor per week     Comment: beer once a month  . Drug Use: No  . Sexual Activity: No   Other Topics Concern  . None   Social History Narrative   Additional History:  Currently her son is living with her and patient is trying to help him move out into an apartment. 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  Assessment:   Musculoskeletal: Strength & Muscle Tone: within normal limits Gait & Station: normal Patient leans: N/A  Psychiatric Specialty Exam: HPI   ROS   Blood pressure 118/68, pulse 99, temperature 98.2 F (36.8 C), temperature source Tympanic, height 5\' 6"  (1.676 m), weight 184 lb 12.8 oz (83.825 kg), SpO2 95 %.  Body mass index is 29.84 kg/(m^2).  General Appearance: Casual  Eye Contact:  Good  Speech:  Clear and Coherent  Volume:  Normal  Mood:  Euthymic  Affect:  Congruent  Thought Process:  Coherent  Orientation:  Full (Time, Place, and Person)  Thought Content:  WDL  Suicidal Thoughts:  No  Homicidal Thoughts:  No  Memory:  Immediate;   Fair  Judgement:  Fair  Insight:  Fair  Psychomotor Activity:  Decreased  Concentration:  Fair  Recall:  [employer] of Knowledge: Fair  Language: Fair  Akathisia:  No  Handed:  Right  AIMS (if indicated):    Assets:  Communication Skills Desire for Improvement Social Support  ADL's:  Intact  Cognition: WNL  Sleep: 6- 7 with trazodone    Is the patient at risk to self?  No. Has the  patient been a risk to self in the past 6 months?  No. Has the patient been a risk to self within the distant past?  No. Is the patient a risk to others?  No. Has the patient been a risk to others in the past 6 months?  No. Has the patient been a risk to others within the distant past?  No.  Current Medications: Current Outpatient Prescriptions  Medication Sig Dispense Refill  . alendronate (FOSAMAX) 70 MG tablet Take 1 tablet by mouth once a week.    Marland Kitchen aspirin 81 MG chewable tablet Chew 81 mg by mouth daily.    . Calcium Carbonate-Vitamin D (CALCIUM + D PO) Take 1 tablet by mouth daily.    . hydrOXYzine (ATARAX/VISTARIL) 25 MG tablet Take 1 tablet (25 mg total) by mouth at bedtime. 30 tablet 2  . mirtazapine (REMERON) 30 MG tablet Take 1 tablet (30 mg total) by mouth daily. 17 tablet 0  . Multiple Vitamin (MULTIVITAMIN) tablet Take 1 tablet by mouth daily.    Marland Kitchen omeprazole (PRILOSEC) 20 MG capsule Take 1 capsule by mouth daily.    . simvastatin (ZOCOR) 20 MG tablet Take 1 tablet by mouth daily.     No current facility-administered medications for this visit.    Medical Decision Making:  Review of Psycho-Social Stressors (1) and Review and summation of old records (2)  Treatment Plan Summary:Medication management   Depression  She will also continue on Remeron 30 mg by mouth daily at bedtime . Medication refill for the 90 day supply  She also takes Atarax 25 mg daily at bedtime when necessary Medication refill for the 90 day supply   More than 50% of the time spent in psychoeducation, counseling and coordination of care.    Time spent with the patient 25 minutes   This note was generated in part or whole with voice recognition software. Voice regonition is usually quite accurate but there are transcription errors that can and very often do occur. I apologize for any typographical errors that were not detected and corrected.    Rainey Pines, MD    03/11/2015, 1:47 PM

## 2015-04-07 ENCOUNTER — Encounter: Payer: Self-pay | Admitting: Family Medicine

## 2015-04-07 ENCOUNTER — Ambulatory Visit (INDEPENDENT_AMBULATORY_CARE_PROVIDER_SITE_OTHER): Payer: Federal, State, Local not specified - PPO | Admitting: Family Medicine

## 2015-04-07 ENCOUNTER — Other Ambulatory Visit: Payer: Self-pay | Admitting: Family Medicine

## 2015-04-07 VITALS — BP 103/70 | HR 88 | Temp 98.3°F | Ht 63.6 in | Wt 181.0 lb

## 2015-04-07 DIAGNOSIS — R52 Pain, unspecified: Secondary | ICD-10-CM

## 2015-04-07 DIAGNOSIS — J101 Influenza due to other identified influenza virus with other respiratory manifestations: Secondary | ICD-10-CM | POA: Diagnosis not present

## 2015-04-07 LAB — INFLUENZA A AND B
INFLUENZA A AG, EIA: POSITIVE — AB
Influenza B Ag, EIA: NEGATIVE

## 2015-04-07 MED ORDER — OSELTAMIVIR PHOSPHATE 75 MG PO CAPS: 75.0000 mg | ORAL_CAPSULE | Freq: Two times a day (BID) | ORAL | Status: DC

## 2015-04-07 NOTE — Progress Notes (Signed)
BP 103/70 mmHg  Pulse 88  Temp(Src) 98.3 F (36.8 C)  Ht 5' 3.6" (1.615 m)  Wt 181 lb (82.101 kg)  BMI 31.48 kg/m2  SpO2 97%   Subjective:    Patient ID: Chelsey [name], female    DOB: December 11, 1950, 65 y.o.   MRN: [id number]  HPI: Chelsey [name] is a 66 y.o. female  Chief Complaint  Patient presents with  . URI    X 2 days   UPPER RESPIRATORY TRACT INFECTION Duration: 2 days Worst symptom: cough and congestion Fever: no Cough: yes Shortness of breath: no Wheezing: no Chest pain: no Chest tightness: no Chest congestion: yes Nasal congestion: yes Runny nose: yes Post nasal drip: yes Sneezing: yes Sore throat: no Swollen glands: no Sinus pressure: no Headache: yes Face pain: no Toothache: no Ear pain: no  Ear pressure: no  Eyes red/itching:no Eye drainage/crusting: no  Vomiting: no Rash: no Fatigue: yes Sick contacts: yes Strep contacts: no  Context: stable Recurrent sinusitis: no Relief with OTC cold/cough medications: no  Treatments attempted: dayquil   Relevant past medical, surgical, family and social history reviewed and updated as indicated. Interim medical history since our last visit reviewed. Allergies and medications reviewed and updated.  Review of Systems  Constitutional: Negative.   HENT: Positive for congestion, ear pain, postnasal drip, rhinorrhea, sinus pressure, sneezing and sore throat. Negative for dental problem, drooling, ear discharge, facial swelling, hearing loss, mouth sores, nosebleeds, tinnitus, trouble swallowing and voice change.   Respiratory: Positive for cough, chest tightness, shortness of breath and wheezing. Negative for apnea, choking and stridor.   Cardiovascular: Negative.  Negative for chest pain, palpitations and leg swelling.  Psychiatric/Behavioral: Negative.     Per HPI unless specifically indicated above     Objective:    BP 103/70 mmHg  Pulse 88  Temp(Src) 98.3 F (36.8 C)  Ht 5' 3.6" (1.615 m)  Wt  181 lb (82.101 kg)  BMI 31.48 kg/m2  SpO2 97%  Wt Readings from Last 3 Encounters:  04/07/15 181 lb (82.101 kg)  03/11/15 184 lb 12.8 oz (83.825 kg)  03/10/15 184 lb (83.462 kg)    Physical Exam  Constitutional: She is oriented to person, place, and time. She appears well-developed and well-nourished. No distress.  HENT:  Head: Normocephalic and atraumatic.  Right Ear: Hearing and external ear normal.  Left Ear: Hearing and external ear normal.  Nose: Nose normal.  Mouth/Throat: Oropharynx is clear and moist. No oropharyngeal exudate.  Eyes: Conjunctivae, EOM and lids are normal. Pupils are equal, round, and reactive to light. Right eye exhibits no discharge. Left eye exhibits no discharge. No scleral icterus.  Neck: Normal range of motion. Neck supple. No JVD present. No tracheal deviation present. No thyromegaly present.  Cardiovascular: Normal rate, regular rhythm, normal heart sounds and intact distal pulses.  Exam reveals no gallop and no friction rub.   No murmur heard. Pulmonary/Chest: Effort normal and breath sounds normal. No stridor. No respiratory distress. She has no wheezes. She has no rales. She exhibits no tenderness.  Musculoskeletal: Normal range of motion.  Lymphadenopathy:    She has cervical adenopathy.  Neurological: She is alert and oriented to person, place, and time.  Skin: Skin is warm, dry and intact. No rash noted. She is not diaphoretic. No erythema. No pallor.  Psychiatric: She has a normal mood and affect. Her speech is normal and behavior is normal. Judgment and thought content normal. Cognition and memory are normal.  Nursing  note and vitals reviewed.   Results for orders placed or performed in visit on 05/31/13  Acetaminophen level  Result Value Ref Range   Acetaminophen < 2 mcg/mL  Comprehensive metabolic panel  Result Value Ref Range   Glucose 115 (H) 65-99 mg/dL   BUN 15 7-18 mg/dL   Creatinine 0.77 0.60-1.30 mg/dL   Sodium 138 136-145 mmol/L    Potassium 3.6 3.5-5.1 mmol/L   Chloride 108 (H) 98-107 mmol/L   Co2 24 21-32 mmol/L   Calcium, Total 8.9 8.5-10.1 mg/dL   SGOT(AST) 23 15-37 Unit/L   SGPT (ALT) 18 12-78 U/L   Alkaline Phosphatase 89 Unit/L   Albumin 3.5 3.4-5.0 g/dL   Total Protein 7.4 6.4-8.2 g/dL   Bilirubin,Total 0.6 0.2-1.0 mg/dL   Osmolality 277 275-301   Anion Gap 6 (L) 7-16   EGFR (African American) >60    EGFR (Non-African Amer.) >60   Ethanol  Result Value Ref Range   Ethanol < 3 mg/dL   Ethanol % < 0.003 0.000-0.080 %  CBC  Result Value Ref Range   WBC 10.4 3.6-11.0 x10 3/mm 3   RBC 4.51 3.80-5.20 X10 6/mm 3   HGB 12.4 12.0-16.0 g/dL   HCT 38.7 35.0-47.0 %   MCV 86 80-100 fL   MCH 27.6 26.0-34.0 pg   MCHC 32.1 32.0-36.0 g/dL   RDW 15.8 (H) 11.5-14.5 %   Platelet 288 875-797 K82 3/mm 3  Salicylate level  Result Value Ref Range   Salicylates, Serum 4.0 (H) mg/dL  TSH  Result Value Ref Range   Thyroid Stimulating Horm 1.43 uIU/mL  Drug Screen, Urine  Result Value Ref Range   Tricyclic, Ur Screen NEGATIVE Cutoff-1000 ng/mL   Amphetamines, Ur Screen NEGATIVE Cutoff-1000 ng/mL   MDMA (Ecstasy)Ur Screen NEGATIVE Cutoff-500 ng/mL   Cocaine Metabolite,Ur [place] NEGATIVE Cutoff-300 ng/mL   Opiate, Ur Screen NEGATIVE Cutoff-300 ng/mL   Phencyclidine (PCP) Ur S NEGATIVE Cutoff-25 ng/mL   Cannabinoid 50 Ng, Ur [place] NEGATIVE Cutoff-50 ng/mL   Barbiturates, Ur Screen NEGATIVE Cutoff-200 ng/mL   Methadone, Ur Screen NEGATIVE Cutoff-300 ng/mL   Benzodiazepine, Ur Scrn NEGATIVE Cutoff-200 ng/mL      Assessment & Plan:   Problem List Items Addressed This Visit    None    Visit Diagnoses    Influenza A    -  Primary    Will treat with tamiflu. Out of work for a week. Call if not getting better or getting worse.     Relevant Medications    oseltamivir (TAMIFLU) 75 MG capsule    Body aches        + for flu        Follow up plan: Return if symptoms worsen or fail to improve.

## 2015-04-30 ENCOUNTER — Other Ambulatory Visit: Payer: Self-pay

## 2015-04-30 MED ORDER — SIMVASTATIN 20 MG PO TABS: 20.0000 mg | ORAL_TABLET | Freq: Every day | ORAL | Status: DC

## 2015-04-30 NOTE — Telephone Encounter (Signed)
Patient has physical scheduled 06/05/15.

## 2015-06-05 ENCOUNTER — Ambulatory Visit: Payer: Federal, State, Local not specified - PPO | Admitting: Psychiatry

## 2015-06-05 ENCOUNTER — Encounter: Payer: Federal, State, Local not specified - PPO | Admitting: Family Medicine

## 2015-06-11 ENCOUNTER — Encounter: Payer: Self-pay | Admitting: Psychiatry

## 2015-06-11 ENCOUNTER — Ambulatory Visit (INDEPENDENT_AMBULATORY_CARE_PROVIDER_SITE_OTHER): Payer: Federal, State, Local not specified - PPO | Admitting: Psychiatry

## 2015-06-11 VITALS — BP 120/78 | HR 93 | Temp 98.1°F | Ht 63.0 in | Wt 182.6 lb

## 2015-06-11 DIAGNOSIS — F330 Major depressive disorder, recurrent, mild: Secondary | ICD-10-CM

## 2015-06-11 MED ORDER — MIRTAZAPINE 30 MG PO TABS: 30.0000 mg | ORAL_TABLET | Freq: Every day | ORAL | Status: DC

## 2015-06-11 MED ORDER — HYDROXYZINE HCL 25 MG PO TABS: 25.0000 mg | ORAL_TABLET | Freq: Every day | ORAL | Status: DC

## 2015-06-11 NOTE — Progress Notes (Signed)
BH MD/PA/NP OP Progress Note  06/11/2015 10:13 AM Chelsey [name]  MRN:  [id number]  Subjective:  Patient is a 65 year old female who presented for the follow-up appointment. She she reported that she has been doing well on the medication. Patient dated that she has been feeling sick due to upper respiratory infection for the past couple of days. She has taken a day off from work. She reported that she also is waiting for the summer at her house. Patient reported that she is compliant with mirtazapine and it has been helping her. She is taking Vistaril on a when necessary basis. Patient appeared calm and collective during the interview. She reported that she does not have any acute issues at this time. She is still working as a Development worker, community [place] 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 She denied having any worsening of her symptoms.  Patient denied having any suicidal homicidal ideations or plans.   Chief Complaint:  Chief Complaint    Follow-up; Medication Refill     Visit Diagnosis:     ICD-9-CM ICD-10-CM   1. Mild episode of recurrent major depressive disorder ([place]) 296.31 F33.0     Past Medical History:  Past Medical History  Diagnosis Date  . GERD (gastroesophageal reflux disease)   . Personal history of malignant neoplasm of breast   . Hyperlipidemia   . Cancer [facility]) 1997    right lumpectomy,L/Ad/R   . Breast cancer [facility]) 1997    right breast, radiation    Past Surgical History  Procedure Laterality Date  . Colonoscopy  2008  . Breast surgery Right 1997    lumpectomy  . Breast excisional biopsy Right 1997    pos   Family History:  Family History  Problem Relation Age of Onset  . Anxiety disorder Brother   . Obesity Brother   . COPD Brother   . Alcohol abuse Son   . Kidney disease Mother   . Heart attack Father   . Hypertension Father   . Alcohol abuse Father   . Depression Brother   . Anxiety disorder Brother   . Breast cancer Maternal Grandmother    Social  History:  Social History   Social History  . Marital Status: Widowed    Spouse Name: N/A  . Number of Children: N/A  . Years of Education: N/A   Social History Main Topics  . Smoking status: Current Some Day Smoker -- 1.00 packs/day for 5 years    Types: Cigarettes    Start date: 11/25/2009  . Smokeless tobacco: Never Used  . Alcohol Use: 0.6 oz/week    1 Cans of beer, 0 Glasses of wine, 0 Shots of liquor per week     Comment: beer once a month  . Drug Use: No  . Sexual Activity: No   Other Topics Concern  . None   Social History Narrative   Additional History:  Currently her son is living with her and patient is trying to help him move out into an apartment. 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  Assessment:   Musculoskeletal: Strength & Muscle Tone: within normal limits Gait & Station: normal Patient leans: N/A  Psychiatric Specialty Exam: HPI  ROS  Blood pressure 120/78, pulse 93, temperature 98.1 F (36.7 C), temperature source Tympanic, height 5\' 3"  (1.6 m), weight 182 lb 9.6 oz (82.827 kg), SpO2 96 %.Body mass index is 32.35 kg/(m^2).  General Appearance: Casual  Eye Contact:  Good  Speech:  Clear and Coherent  Volume:  Normal  Mood:  Euthymic  Affect:  Congruent  Thought Process:  Coherent  Orientation:  Full (Time, Place, and Person)  Thought Content:  WDL  Suicidal Thoughts:  No  Homicidal Thoughts:  No  Memory:  Immediate;   Fair  Judgement:  Fair  Insight:  Fair  Psychomotor Activity:  Decreased  Concentration:  Fair  Recall:  [employer] of Knowledge: Fair  Language: Fair  Akathisia:  No  Handed:  Right  AIMS (if indicated):    Assets:  Communication Skills Desire for Improvement Social Support  ADL's:  Intact  Cognition: WNL  Sleep: 6- 7 with trazodone    Is the patient at risk to self?  No. Has the patient been a risk to self in the past 6 months?  No. Has the patient been a risk to self within the distant past?   No. Is the patient a risk to others?  No. Has the patient been a risk to others in the past 6 months?  No. Has the patient been a risk to others within the distant past?  No.  Current Medications: Current Outpatient Prescriptions  Medication Sig Dispense Refill  . alendronate (FOSAMAX) 70 MG tablet Take 1 tablet by mouth once a week.    Marland Kitchen aspirin 81 MG chewable tablet Chew 81 mg by mouth daily.    . Calcium Carbonate-Vitamin D (CALCIUM + D PO) Take 1 tablet by mouth daily.    . hydrOXYzine (ATARAX/VISTARIL) 25 MG tablet Take 1 tablet (25 mg total) by mouth at bedtime. 90 tablet 2  . mirtazapine (REMERON) 30 MG tablet Take 1 tablet (30 mg total) by mouth at bedtime. 90 tablet 3  . Multiple Vitamin (MULTIVITAMIN) tablet Take 1 tablet by mouth daily.    Marland Kitchen omeprazole (PRILOSEC) 20 MG capsule Take 1 capsule by mouth daily.    . simvastatin (ZOCOR) 20 MG tablet Take 1 tablet (20 mg total) by mouth daily. 30 tablet 1  . oseltamivir (TAMIFLU) 75 MG capsule Take 1 capsule (75 mg total) by mouth 2 (two) times daily. (Patient not taking: Reported on 06/11/2015) 10 capsule 0   No current facility-administered medications for this visit.    Medical Decision Making:  Review of Psycho-Social Stressors (1) and Review and summation of old records (2)  Treatment Plan Summary:Medication management   Depression  She will also continue on Remeron 30 mg by mouth daily at bedtime . Medication refill for the 90 day supply  She also takes Atarax 25 mg daily at bedtime when necessary Medication refill for the 90 day supply   More than 50% of the time spent in psychoeducation, counseling and coordination of care.    Time spent with the patient 25 minutes   This note was generated in part or whole with voice recognition software. Voice regonition is usually quite accurate but there are transcription errors that can and very often do occur. I apologize for any typographical errors that were not detected and  corrected.    Rainey Pines, MD    06/11/2015, 10:13 AM

## 2015-07-04 ENCOUNTER — Encounter: Payer: Self-pay | Admitting: Unknown Physician Specialty

## 2015-07-04 ENCOUNTER — Ambulatory Visit (INDEPENDENT_AMBULATORY_CARE_PROVIDER_SITE_OTHER): Payer: Federal, State, Local not specified - PPO | Admitting: Unknown Physician Specialty

## 2015-07-04 VITALS — BP 87/59 | HR 89 | Temp 98.0°F | Ht 64.8 in | Wt 184.2 lb

## 2015-07-04 DIAGNOSIS — Z0000 Encounter for general adult medical examination without abnormal findings: Secondary | ICD-10-CM | POA: Diagnosis not present

## 2015-07-04 MED ORDER — ALENDRONATE SODIUM 70 MG PO TABS: 70.0000 mg | ORAL_TABLET | ORAL | Status: DC

## 2015-07-04 MED ORDER — OMEPRAZOLE 20 MG PO CPDR: 20.0000 mg | DELAYED_RELEASE_CAPSULE | Freq: Every day | ORAL | Status: DC

## 2015-07-04 MED ORDER — SIMVASTATIN 20 MG PO TABS: 20.0000 mg | ORAL_TABLET | Freq: Every day | ORAL | Status: DC

## 2015-07-04 NOTE — Progress Notes (Signed)
BP 87/59 mmHg  Pulse 89  Temp(Src) 98 F (36.7 C)  Ht 5' 4.8" (1.646 m)  Wt 184 lb 3.2 oz (83.553 kg)  BMI 30.84 kg/m2  SpO2 98%  LMP  (LMP Unknown)   Subjective:    Patient ID: Chelsey [name], female    DOB: 1950/03/11, 65 y.o.   MRN: [id number]  HPI: Chelsey [name] is a 65 y.o. female  Chief Complaint  Patient presents with  . Annual Exam  . Labs Only    Hep C and HIV ordered entered   Social History   Social History  . Marital Status: Widowed    Spouse Name: N/A  . Number of Children: N/A  . Years of Education: N/A   Occupational History  . Not on file.   Social History Main Topics  . Smoking status: Former Smoker -- 1.00 packs/day for 5 years    Types: Cigarettes    Start date: 11/25/2009    Quit date: 06/09/2015  . Smokeless tobacco: Never Used  . Alcohol Use: 0.6 oz/week    0 Glasses of wine, 1 Cans of beer, 0 Shots of liquor per week     Comment: beer once a month  . Drug Use: No  . Sexual Activity: No   Other Topics Concern  . Not on file   Social History Narrative   Past Medical History  Diagnosis Date  . GERD (gastroesophageal reflux disease)   . Personal history of malignant neoplasm of breast   . Hyperlipidemia   . Cancer [facility]) 1997    right lumpectomy,L/Ad/R   . Breast cancer [facility]) 1997    right breast, radiation   Past Surgical History  Procedure Laterality Date  . Colonoscopy  2008  . Breast surgery Right 1997    lumpectomy  . Breast excisional biopsy Right 1997    pos     Relevant past medical, surgical, family and social history reviewed and updated as indicated. Interim medical history since our last visit reviewed. Allergies and medications reviewed and updated.  Review of Systems  Constitutional: Negative.   HENT: Negative.   Eyes: Negative.   Respiratory: Negative.   Cardiovascular: Negative.   Gastrointestinal: Negative.   Endocrine: Negative.   Genitourinary: Negative.   Musculoskeletal: Negative.   Skin:  Negative.   Allergic/Immunologic: Negative.   Neurological: Negative.   Hematological: Negative.   Psychiatric/Behavioral: Negative.     Per HPI unless specifically indicated above     Objective:    BP 87/59 mmHg  Pulse 89  Temp(Src) 98 F (36.7 C)  Ht 5' 4.8" (1.646 m)  Wt 184 lb 3.2 oz (83.553 kg)  BMI 30.84 kg/m2  SpO2 98%  LMP  (LMP Unknown)  Wt Readings from Last 3 Encounters:  07/04/15 184 lb 3.2 oz (83.553 kg)  06/11/15 182 lb 9.6 oz (82.827 kg)  04/07/15 181 lb (82.101 kg)    Physical Exam  Constitutional: She is oriented to person, place, and time. She appears well-developed and well-nourished.  HENT:  Head: Normocephalic and atraumatic.  Eyes: Pupils are equal, round, and reactive to light. Right eye exhibits no discharge. Left eye exhibits no discharge. No scleral icterus.  Neck: Normal range of motion. Neck supple. Carotid bruit is not present. No thyromegaly present.  Cardiovascular: Normal rate, regular rhythm and normal heart sounds.  Exam reveals no gallop and no friction rub.   No murmur heard. Pulmonary/Chest: Effort normal and breath sounds normal. No respiratory distress. She has no  wheezes. She has no rales.  Abdominal: Soft. Bowel sounds are normal. There is no tenderness. There is no rebound.  Genitourinary: Vagina normal and uterus normal. No breast swelling, tenderness or discharge. Cervix exhibits no motion tenderness, no discharge and no friability. Right adnexum displays no mass, no tenderness and no fullness. Left adnexum displays no mass, no tenderness and no fullness.  Musculoskeletal: Normal range of motion.  Lymphadenopathy:    She has no cervical adenopathy.  Neurological: She is alert and oriented to person, place, and time.  Skin: Skin is warm, dry and intact. No rash noted.  Psychiatric: She has a normal mood and affect. Her speech is normal and behavior is normal. Judgment and thought content normal. Cognition and memory are normal.     Results for orders placed or performed in visit on 07/04/15  HM COLONOSCOPY  Result Value Ref Range   HM Colonoscopy See Report See Report, Patient Reported Normal      Assessment & Plan:   Problem List Items Addressed This Visit    None    Visit Diagnoses    Health care maintenance    -  Primary    Relevant Orders    Hepatitis C antibody    HIV antibody    CBC with Differential/Platelet    Comprehensive metabolic panel    IGP, Aptima HPV, rfx 16/18,45    Lipid Panel w/o Chol/HDL Ratio    TSH    Ambulatory referral to Gastroenterology        Follow up plan: Return in about 6 months (around 01/03/2016).

## 2015-07-04 NOTE — Addendum Note (Signed)
Addended by: Kathrine Haddock on: 07/04/2015 02:35 PM   Modules accepted: Orders, SmartSet

## 2015-07-05 LAB — CBC WITH DIFFERENTIAL/PLATELET
BASOS ABS: 0 10*3/uL (ref 0.0–0.2)
Basos: 1 %
EOS (ABSOLUTE): 0.2 10*3/uL (ref 0.0–0.4)
Eos: 4 %
Hematocrit: 43 % (ref 34.0–46.6)
Hemoglobin: 14.2 g/dL (ref 11.1–15.9)
IMMATURE GRANS (ABS): 0 10*3/uL (ref 0.0–0.1)
IMMATURE GRANULOCYTES: 0 %
LYMPHS: 37 %
Lymphocytes Absolute: 1.8 10*3/uL (ref 0.7–3.1)
MCH: 30.7 pg (ref 26.6–33.0)
MCHC: 33 g/dL (ref 31.5–35.7)
MCV: 93 fL (ref 79–97)
MONOS ABS: 0.5 10*3/uL (ref 0.1–0.9)
Monocytes: 10 %
Neutrophils Absolute: 2.3 10*3/uL (ref 1.4–7.0)
Neutrophils: 48 %
PLATELETS: 286 10*3/uL (ref 150–379)
RBC: 4.62 x10E6/uL (ref 3.77–5.28)
RDW: 13.9 % (ref 12.3–15.4)
WBC: 4.9 10*3/uL (ref 3.4–10.8)

## 2015-07-05 LAB — HEPATITIS C ANTIBODY

## 2015-07-05 LAB — COMPREHENSIVE METABOLIC PANEL
A/G RATIO: 1.5 (ref 1.2–2.2)
ALT: 18 IU/L (ref 0–32)
AST: 20 IU/L (ref 0–40)
Albumin: 4 g/dL (ref 3.6–4.8)
Alkaline Phosphatase: 91 IU/L (ref 39–117)
BILIRUBIN TOTAL: 0.4 mg/dL (ref 0.0–1.2)
BUN/Creatinine Ratio: 25 (ref 12–28)
BUN: 19 mg/dL (ref 8–27)
CALCIUM: 9.5 mg/dL (ref 8.7–10.3)
CO2: 23 mmol/L (ref 18–29)
Chloride: 103 mmol/L (ref 96–106)
Creatinine, Ser: 0.76 mg/dL (ref 0.57–1.00)
GFR, EST AFRICAN AMERICAN: 97 mL/min/{1.73_m2} (ref >59)
GFR, EST NON AFRICAN AMERICAN: 84 mL/min/{1.73_m2} (ref >59)
GLUCOSE: 100 mg/dL — AB (ref 65–99)
Globulin, Total: 2.6 g/dL (ref 1.5–4.5)
POTASSIUM: 4.8 mmol/L (ref 3.5–5.2)
SODIUM: 143 mmol/L (ref 134–144)
TOTAL PROTEIN: 6.6 g/dL (ref 6.0–8.5)

## 2015-07-05 LAB — HIV ANTIBODY (ROUTINE TESTING W REFLEX): HIV SCREEN 4TH GENERATION: NONREACTIVE

## 2015-07-05 LAB — TSH: TSH: 4.2 u[IU]/mL (ref 0.450–4.500)

## 2015-07-05 LAB — LIPID PANEL W/O CHOL/HDL RATIO
Cholesterol, Total: 247 mg/dL — ABNORMAL HIGH (ref 100–199)
HDL: 60 mg/dL (ref >39)
LDL CALC: 149 mg/dL — AB (ref 0–99)
Triglycerides: 191 mg/dL — ABNORMAL HIGH (ref 0–149)
VLDL Cholesterol Cal: 38 mg/dL (ref 5–40)

## 2015-07-08 LAB — IGP, APTIMA HPV, RFX 16/18,45
HPV APTIMA: NEGATIVE
PAP SMEAR COMMENT: 0

## 2015-07-10 ENCOUNTER — Other Ambulatory Visit: Payer: Self-pay

## 2015-07-10 ENCOUNTER — Telehealth: Payer: Self-pay

## 2015-07-10 NOTE — Telephone Encounter (Signed)
Gastroenterology Pre-Procedure Review  Request Date: 07/21/15 Requesting Physician: Kathrine Haddock, NP  PATIENT REVIEW QUESTIONS: The patient responded to the following health history questions as indicated:    1. Are you having any GI issues? no 2. Do you have a personal history of Polyps? no 3. Do you have a family history of Colon Cancer or Polyps? no 4. Diabetes Mellitus? no 5. Joint replacements in the past 12 months?no 6. Major health problems in the past 3 months?no 7. Any artificial heart valves, MVP, or defibrillator?no    MEDICATIONS & ALLERGIES:    Patient reports the following regarding taking any anticoagulation/antiplatelet therapy:   Plavix, Coumadin, Eliquis, Xarelto, Lovenox, Pradaxa, Brilinta, or Effient? no Aspirin? yes (ASA 81mg )  Patient confirms/reports the following medications:  Current Outpatient Prescriptions  Medication Sig Dispense Refill  . alendronate (FOSAMAX) 70 MG tablet Take 1 tablet (70 mg total) by mouth once a week. 12 tablet 3  . aspirin 81 MG chewable tablet Chew 81 mg by mouth daily.    . Calcium Carbonate-Vitamin D (CALCIUM + D PO) Take 1 tablet by mouth daily.    . hydrOXYzine (ATARAX/VISTARIL) 25 MG tablet Take 1 tablet (25 mg total) by mouth at bedtime. 90 tablet 2  . mirtazapine (REMERON) 30 MG tablet Take 1 tablet (30 mg total) by mouth at bedtime. 90 tablet 3  . Multiple Vitamin (MULTIVITAMIN) tablet Take 1 tablet by mouth daily.    Marland Kitchen omeprazole (PRILOSEC) 20 MG capsule Take 1 capsule (20 mg total) by mouth daily. 90 capsule 1  . simvastatin (ZOCOR) 20 MG tablet Take 1 tablet (20 mg total) by mouth daily. 90 tablet 1   No current facility-administered medications for this visit.    Patient confirms/reports the following allergies:  No Known Allergies  No orders of the defined types were placed in this encounter.    AUTHORIZATION INFORMATION Primary Insurance: 1D#: Group #:  Secondary Insurance: 1D#: Group #:  SCHEDULE  INFORMATION: Date: 07/21/15 Time: Location: [place]

## 2015-07-17 NOTE — Discharge Instructions (Signed)

## 2015-07-21 ENCOUNTER — Ambulatory Visit: Payer: Federal, State, Local not specified - PPO | Admitting: Anesthesiology

## 2015-07-21 ENCOUNTER — Encounter
Admission: RE | Disposition: A | Discharge status: Home / Self Care | Payer: Self-pay | Source: Ambulatory Visit | Attending: Gastroenterology

## 2015-07-21 ENCOUNTER — Ambulatory Visit
Admission: RE | Admit: 2015-07-21 | Discharge: 2015-07-21 | Disposition: A | Discharge status: Home / Self Care | Payer: Federal, State, Local not specified - PPO | Source: Ambulatory Visit | Attending: Gastroenterology | Admitting: Gastroenterology

## 2015-07-21 DIAGNOSIS — Z1211 Encounter for screening for malignant neoplasm of colon: Secondary | ICD-10-CM | POA: Diagnosis not present

## 2015-07-21 DIAGNOSIS — Z87891 Personal history of nicotine dependence: Secondary | ICD-10-CM | POA: Insufficient documentation

## 2015-07-21 DIAGNOSIS — K219 Gastro-esophageal reflux disease without esophagitis: Secondary | ICD-10-CM | POA: Diagnosis not present

## 2015-07-21 DIAGNOSIS — K641 Second degree hemorrhoids: Secondary | ICD-10-CM | POA: Diagnosis not present

## 2015-07-21 DIAGNOSIS — D124 Benign neoplasm of descending colon: Secondary | ICD-10-CM | POA: Diagnosis not present

## 2015-07-21 DIAGNOSIS — D125 Benign neoplasm of sigmoid colon: Secondary | ICD-10-CM | POA: Insufficient documentation

## 2015-07-21 DIAGNOSIS — E785 Hyperlipidemia, unspecified: Secondary | ICD-10-CM | POA: Diagnosis not present

## 2015-07-21 DIAGNOSIS — E7800 Pure hypercholesterolemia, unspecified: Secondary | ICD-10-CM | POA: Diagnosis not present

## 2015-07-21 DIAGNOSIS — Z923 Personal history of irradiation: Secondary | ICD-10-CM | POA: Insufficient documentation

## 2015-07-21 DIAGNOSIS — Z853 Personal history of malignant neoplasm of breast: Secondary | ICD-10-CM | POA: Insufficient documentation

## 2015-07-21 DIAGNOSIS — Z836 Family history of other diseases of the respiratory system: Secondary | ICD-10-CM | POA: Diagnosis not present

## 2015-07-21 DIAGNOSIS — Z9889 Other specified postprocedural states: Secondary | ICD-10-CM | POA: Diagnosis not present

## 2015-07-21 DIAGNOSIS — Z841 Family history of disorders of kidney and ureter: Secondary | ICD-10-CM | POA: Insufficient documentation

## 2015-07-21 DIAGNOSIS — Z818 Family history of other mental and behavioral disorders: Secondary | ICD-10-CM | POA: Insufficient documentation

## 2015-07-21 DIAGNOSIS — Z8249 Family history of ischemic heart disease and other diseases of the circulatory system: Secondary | ICD-10-CM | POA: Insufficient documentation

## 2015-07-21 DIAGNOSIS — Z811 Family history of alcohol abuse and dependence: Secondary | ICD-10-CM | POA: Diagnosis not present

## 2015-07-21 DIAGNOSIS — Z79899 Other long term (current) drug therapy: Secondary | ICD-10-CM | POA: Insufficient documentation

## 2015-07-21 DIAGNOSIS — K635 Polyp of colon: Secondary | ICD-10-CM | POA: Diagnosis not present

## 2015-07-21 DIAGNOSIS — Z803 Family history of malignant neoplasm of breast: Secondary | ICD-10-CM | POA: Insufficient documentation

## 2015-07-21 DIAGNOSIS — Z7982 Long term (current) use of aspirin: Secondary | ICD-10-CM | POA: Diagnosis not present

## 2015-07-21 HISTORY — DX: Bronchitis, not specified as acute or chronic: J40

## 2015-07-21 HISTORY — PX: COLONOSCOPY WITH PROPOFOL: SHX5780

## 2015-07-21 HISTORY — DX: Pure hypercholesterolemia, unspecified: E78.00

## 2015-07-21 SURGERY — COLONOSCOPY WITH PROPOFOL
Anesthesia: Monitor Anesthesia Care

## 2015-07-21 MED ORDER — STERILE WATER FOR IRRIGATION IR SOLN
Status: DC | PRN
  Administered 2015-07-21: 08:00:00

## 2015-07-21 MED ORDER — PROPOFOL 10 MG/ML IV BOLUS
INTRAVENOUS | Status: DC | PRN
  Administered 2015-07-21: 50 mg via INTRAVENOUS
  Administered 2015-07-21: 20 mg via INTRAVENOUS
  Administered 2015-07-21: 100 mg via INTRAVENOUS
  Administered 2015-07-21: 30 mg via INTRAVENOUS
  Administered 2015-07-21: 50 mg via INTRAVENOUS

## 2015-07-21 MED ORDER — OXYCODONE HCL 5 MG/5ML PO SOLN: 5.0000 mg | Freq: Once | ORAL | Status: DC | PRN

## 2015-07-21 MED ORDER — LACTATED RINGERS IV SOLN
INTRAVENOUS | Status: DC
  Administered 2015-07-21: 07:00:00 via INTRAVENOUS

## 2015-07-21 MED ORDER — LIDOCAINE HCL (CARDIAC) 20 MG/ML IV SOLN
INTRAVENOUS | Status: DC | PRN
  Administered 2015-07-21: 50 mg via INTRAVENOUS

## 2015-07-21 MED ORDER — OXYCODONE HCL 5 MG PO TABS: 5.0000 mg | ORAL_TABLET | Freq: Once | ORAL | Status: DC | PRN

## 2015-07-21 SURGICAL SUPPLY — 23 items
CANISTER SUCT 1200ML W/VALVE (MISCELLANEOUS) ×2 IMPLANT
CLIP HMST 235XBRD CATH ROT (MISCELLANEOUS) IMPLANT
CLIP RESOLUTION 360 11X235 (MISCELLANEOUS)
FCP ESCP3.2XJMB 240X2.8X (MISCELLANEOUS)
FORCEPS BIOP RAD 4 LRG CAP 4 (CUTTING FORCEPS) IMPLANT
FORCEPS BIOP RJ4 240 W/NDL (MISCELLANEOUS)
FORCEPS ESCP3.2XJMB 240X2.8X (MISCELLANEOUS) IMPLANT
GOWN CVR UNV OPN BCK APRN NK (MISCELLANEOUS) ×2 IMPLANT
GOWN ISOL THUMB LOOP REG UNIV (MISCELLANEOUS) ×2
INJECTOR VARIJECT VIN23 (MISCELLANEOUS) IMPLANT
KIT DEFENDO VALVE AND CONN (KITS) IMPLANT
KIT ENDO PROCEDURE OLY (KITS) ×2 IMPLANT
MARKER SPOT ENDO TATTOO 5ML (MISCELLANEOUS) IMPLANT
PAD GROUND ADULT SPLIT (MISCELLANEOUS) IMPLANT
PROBE APC STR FIRE (PROBE) IMPLANT
RETRIEVER NET ROTH 2.5X230 LF (MISCELLANEOUS) ×2 IMPLANT
SNARE SHORT THROW 13M SML OVAL (MISCELLANEOUS) IMPLANT
SNARE SHORT THROW 30M LRG OVAL (MISCELLANEOUS) ×2 IMPLANT
SNARE SNG USE RND 15MM (INSTRUMENTS) IMPLANT
SPOT EX ENDOSCOPIC TATTOO (MISCELLANEOUS)
TRAP ETRAP POLY (MISCELLANEOUS) ×2 IMPLANT
VARIJECT INJECTOR VIN23 (MISCELLANEOUS)
WATER STERILE IRR 250ML POUR (IV SOLUTION) ×2 IMPLANT

## 2015-07-21 NOTE — Anesthesia Postprocedure Evaluation (Signed)
Anesthesia Post Note  Patient: Chelsey [name]  Procedure(s) Performed: Procedure(s) (LRB): COLONOSCOPY WITH PROPOFOL (N/A)  Patient location during evaluation: PACU Anesthesia Type: MAC Level of consciousness: awake and alert Pain management: pain level controlled Vital Signs Assessment: post-procedure vital signs reviewed and stable Respiratory status: spontaneous breathing, nonlabored ventilation, respiratory function stable and patient connected to nasal cannula oxygen Cardiovascular status: stable and blood pressure returned to baseline Anesthetic complications: no    Kadance Mccuistion

## 2015-07-21 NOTE — Transfer of Care (Signed)
Immediate Anesthesia Transfer of Care Note  Patient: Chelsey [name]  Procedure(s) Performed: Procedure(s): COLONOSCOPY WITH PROPOFOL (N/A)  Patient Location: PACU  Anesthesia Type: MAC  Level of Consciousness: awake, alert  and patient cooperative  Airway and Oxygen Therapy: Patient Spontanous Breathing and Patient connected to supplemental oxygen  Post-op Assessment: Post-op Vital signs reviewed, Patient's Cardiovascular Status Stable, Respiratory Function Stable, Patent Airway and No signs of Nausea or vomiting  Post-op Vital Signs: Reviewed and stable  Complications: No apparent anesthesia complications

## 2015-07-21 NOTE — Anesthesia Preprocedure Evaluation (Addendum)
Anesthesia Evaluation  Patient identified by MRN, date of birth, ID band  Reviewed: NPO status   History of Anesthesia Complications Negative for: history of anesthetic complications  Airway Mallampati: II  TM Distance: >3 FB Neck ROM: full    Dental  (+) Chipped,    Pulmonary neg pulmonary ROS, former smoker,    Pulmonary exam normal        Cardiovascular Exercise Tolerance: Good negative cardio ROS Normal cardiovascular exam     Neuro/Psych PSYCHIATRIC DISORDERS Anxiety Depression negative neurological ROS     GI/Hepatic Neg liver ROS, GERD  Controlled,  Endo/Other  negative endocrine ROS  Renal/GU negative Renal ROS  negative genitourinary   Musculoskeletal   Abdominal   Peds  Hematology R breast cancer;    Anesthesia Other Findings   Reproductive/Obstetrics                            Anesthesia Physical Anesthesia Plan  ASA: II  Anesthesia Plan: MAC   Post-op Pain Management:    Induction:   Airway Management Planned:   Additional Equipment:   Intra-op Plan:   Post-operative Plan:   Informed Consent: I have reviewed the patients History and Physical, chart, labs and discussed the procedure including the risks, benefits and alternatives for the proposed anesthesia with the patient or authorized representative who has indicated his/her understanding and acceptance.     Plan Discussed with: CRNA  Anesthesia Plan Comments:         Anesthesia Quick Evaluation

## 2015-07-21 NOTE — Op Note (Signed)
[facility] Gastroenterology Patient Name: Chelsey [name] Procedure Date: 07/21/2015 7:32 AM MRN: [id number] Account #: [id number] Date of Birth: 06-Dec-1950 Admit Type: Outpatient Age: 65 Room: [facility] OR ROOM 01 Gender: Female Note Status: Finalized Procedure:            Colonoscopy Indications:          Screening for colorectal malignant neoplasm Providers:            Lucilla Lame, MD Referring MD:         Kathrine Haddock, PA (Referring MD) Medicines:            Propofol per Anesthesia Complications:        No immediate complications. Procedure:            Pre-Anesthesia Assessment:                       - Prior to the procedure, a History and Physical was                        performed, and patient medications and allergies were                        reviewed. The patient's tolerance of previous                        anesthesia was also reviewed. The risks and benefits of                        the procedure and the sedation options and risks were                        discussed with the patient. All questions were                        answered, and informed consent was obtained. Prior                        Anticoagulants: The patient has taken no previous                        anticoagulant or antiplatelet agents. ASA Grade                        Assessment: II - A patient with mild systemic disease.                        After reviewing the risks and benefits, the patient was                        deemed in satisfactory condition to undergo the                        procedure.                       After obtaining informed consent, the colonoscope was                        passed under direct vision. Throughout the procedure,  the patient's blood pressure, pulse, and oxygen                        saturations were monitored continuously. The Olympus                        CF-HQ190L Colonoscope (S#. [id number]) was introduced                 through the anus and advanced to the the cecum,                        identified by appendiceal orifice and ileocecal valve.                        The colonoscopy was performed without difficulty. The                        patient tolerated the procedure well. The quality of                        the bowel preparation was excellent. Findings:      The perianal and digital rectal examinations were normal.      A 7 mm polyp was found in the descending colon. The polyp was sessile.       The polyp was removed with a cold snare. Resection and retrieval were       complete.      A 5 mm polyp was found in the sigmoid colon. The polyp was sessile. The       polyp was removed with a cold snare. Resection and retrieval were       complete.      Non-bleeding internal hemorrhoids were found during retroflexion. The       hemorrhoids were Grade II (internal hemorrhoids that prolapse but reduce       spontaneously). Impression:           - One 7 mm polyp in the descending colon, removed with                        a cold snare. Resected and retrieved.                       - One 5 mm polyp in the sigmoid colon, removed with a                        cold snare. Resected and retrieved.                       - Non-bleeding internal hemorrhoids. Recommendation:       - Await pathology results.                       - Repeat colonoscopy in 5 years if polyp adenoma and 10                        years if hyperplastic Procedure Code(s):    --- Professional ---                       [id number], Colonoscopy, flexible; with removal of tumor(s),  polyp(s), or other lesion(s) by snare technique Diagnosis Code(s):    --- Professional ---                       Z12.11, Encounter for screening for malignant neoplasm                        of colon                       D12.4, Benign neoplasm of descending colon                       D12.5, Benign neoplasm of sigmoid colon CPT  copyright 2016 American Medical Association. All rights reserved. The codes documented in this report are preliminary and upon coder review may  be revised to meet current compliance requirements. Lucilla Lame, MD 07/21/2015 8:15:10 AM This report has been signed electronically. Number of Addenda: 0 Note Initiated On: 07/21/2015 7:32 AM Scope Withdrawal Time: 0 hours 8 minutes 11 seconds  Total Procedure Duration: 0 hours 11 minutes 16 seconds       [facility]

## 2015-07-21 NOTE — H&P (Signed)
Lucilla Lame, MD [facility] [address], [place], [place] [address] Phone: [phone] Fax : [phone]  Primary Care Physician:  Kathrine Haddock, NP Primary Gastroenterologist:  Dr. Allen Norris  Pre-Procedure History & Physical: HPI:  Chelsey [name] is a 65 y.o. female is here for a screening colonoscopy.   Past Medical History  Diagnosis Date  . GERD (gastroesophageal reflux disease)   . Personal history of malignant neoplasm of breast   . Hyperlipidemia   . Cancer [facility]) 1997    right lumpectomy,L/Ad/R   . Breast cancer ([place]) 1997    right breast, radiation  . Hypercholesteremia   . Bronchitis     recent/ 06/09/15 [facility] x[facility]/resolved    Past Surgical History  Procedure Laterality Date  . Colonoscopy  2008  . Breast surgery Right 1997    lumpectomy  . Breast excisional biopsy Right 1997    pos    Prior to Admission medications   Medication Sig Start Date End Date Taking? Authorizing Provider  alendronate (FOSAMAX) 70 MG tablet Take 1 tablet (70 mg total) by mouth once a week. 07/04/15  Yes Kathrine Haddock, NP  aspirin 81 MG chewable tablet Chew 81 mg by mouth daily. am   Yes Historical Provider, MD  Calcium Carbonate-Vitamin D (CALCIUM + D PO) Take 1 tablet by mouth daily. am   Yes Historical Provider, MD  hydrOXYzine (ATARAX/VISTARIL) 25 MG tablet Take 1 tablet (25 mg total) by mouth at bedtime. Patient taking differently: Take 25 mg by mouth as needed.  06/11/15  Yes Rainey Pines, MD  mirtazapine (REMERON) 30 MG tablet Take 1 tablet (30 mg total) by mouth at bedtime. 06/11/15  Yes Rainey Pines, MD  Multiple Vitamin (MULTIVITAMIN) tablet Take 1 tablet by mouth daily. am   Yes Historical Provider, MD  omeprazole (PRILOSEC) 20 MG capsule Take 1 capsule (20 mg total) by mouth daily. Patient taking differently: Take 20 mg by mouth daily. am 07/04/15  Yes Kathrine Haddock, NP  simvastatin (ZOCOR) 20 MG tablet Take 1 tablet (20 mg total) by mouth daily. Patient  taking differently: Take 20 mg by mouth daily. bedtime 07/04/15  Yes Kathrine Haddock, NP    Allergies as of 07/10/2015  . (No Known Allergies)    Family History  Problem Relation Age of Onset  . Anxiety disorder Brother   . Obesity Brother   . COPD Brother   . Kidney disease Mother   . Heart attack Father   . Hypertension Father   . Alcohol abuse Father   . Depression Brother   . Anxiety disorder Brother   . Breast cancer Maternal Grandmother     Social History   Social History  . Marital Status: Widowed    Spouse Name: N/A  . Number of Children: N/A  . Years of Education: N/A   Occupational History  . Not on file.   Social History Main Topics  . Smoking status: Former Smoker -- 1.00 packs/day for 5 years    Types: Cigarettes    Start date: 11/25/2009    Quit date: 06/09/2015  . Smokeless tobacco: Never Used  . Alcohol Use: 0.0 oz/week    0 Glasses of wine, 0 Shots of liquor per week     Comment: rarely  . Drug Use: No  . Sexual Activity: No   Other Topics Concern  . Not on file   Social History Narrative    Review of Systems: See HPI, otherwise negative ROS  Physical  Exam: BP 110/83 mmHg  Pulse 88  Temp(Src) 97.7 F (36.5 C) (Temporal)  Resp 16  Ht 5\' 4"  (1.626 m)  Wt 177 lb (80.287 kg)  BMI 30.37 kg/m2  SpO2 98%  LMP  (LMP Unknown) General:   Alert,  pleasant and cooperative in NAD Head:  Normocephalic and atraumatic. Neck:  Supple; no masses or thyromegaly. Lungs:  Clear throughout to auscultation.    Heart:  Regular rate and rhythm. Abdomen:  Soft, nontender and nondistended. Normal bowel sounds, without guarding, and without rebound.   Neurologic:  Alert and  oriented x4;  grossly normal neurologically.  Impression/Plan: Chelsey [name] is now here to undergo a screening colonoscopy.  Risks, benefits, and alternatives regarding colonoscopy have been reviewed with the patient.  Questions have been answered.  All parties agreeable.

## 2015-07-21 NOTE — Anesthesia Procedure Notes (Signed)
Procedure Name: MAC Date/Time: 07/21/2015 7:53 AM Performed by: Cameron Ali Pre-anesthesia Checklist: Patient identified, Emergency Drugs available, Suction available, Timeout performed and Patient being monitored Patient Re-evaluated:Patient Re-evaluated prior to inductionOxygen Delivery Method: Nasal cannula Placement Confirmation: positive ETCO2

## 2015-07-22 ENCOUNTER — Encounter: Payer: Self-pay | Admitting: Gastroenterology

## 2015-07-29 ENCOUNTER — Encounter: Payer: Self-pay | Admitting: Gastroenterology

## 2015-09-11 ENCOUNTER — Ambulatory Visit (INDEPENDENT_AMBULATORY_CARE_PROVIDER_SITE_OTHER): Payer: Federal, State, Local not specified - PPO | Admitting: Psychiatry

## 2015-09-11 ENCOUNTER — Encounter: Payer: Self-pay | Admitting: Psychiatry

## 2015-09-11 VITALS — BP 116/80 | HR 85 | Temp 98.0°F | Ht 64.0 in | Wt 184.2 lb

## 2015-09-11 DIAGNOSIS — F330 Major depressive disorder, recurrent, mild: Secondary | ICD-10-CM | POA: Diagnosis not present

## 2015-09-11 MED ORDER — HYDROXYZINE HCL 25 MG PO TABS: 25.0000 mg | ORAL_TABLET | Freq: Every day | ORAL | 2 refills | Status: DC

## 2015-09-11 MED ORDER — MIRTAZAPINE 30 MG PO TABS: 30.0000 mg | ORAL_TABLET | Freq: Every day | ORAL | 3 refills | Status: DC

## 2015-09-11 NOTE — Progress Notes (Signed)
BH MD/PA/NP OP Progress Note  09/11/2015 10:26 AM Chelsey [name]  MRN:  [id number]  Subjective:  Patient is a 65 year old female who presented for the follow-up appointment. She  reported that both her car broke down last week and she has to take out money from her 401K to get them fixed as she has currently applied for bankruptcy. She reported that one of her car has been fixed  and she is able to go back to work. She works as a Dispensing optician. Patient reported that she has been compliant with her medications. She has also lost some weight since her last appointment. Patient reported that she enjoyed time with her grandchildren yesterday. Her son went to the  show with her wife and she was taking care of them. She reported that she has been doing well and is sleeping good at night. She has already stopped taking the trazodone at night.   She appeared calm and collected during the interview. She does not have any acute issues at this time. She currently denied having any more swings anger anxiety or paranoia.    Patient denied having any suicidal homicidal ideations or plans.   Chief Complaint:  Chief Complaint    Follow-up; Medication Refill     Visit Diagnosis:     ICD-9-CM ICD-10-CM   1. Mild episode of recurrent major depressive disorder ([place]) 296.31 F33.0     Past Medical History:  Past Medical History:  Diagnosis Date  . Breast cancer ([place]) 1997   right breast, radiation  . Bronchitis    recent/ 06/09/15 had [facility]/resolved  . Cancer [facility]) 1997   right lumpectomy,L/Ad/R   . GERD (gastroesophageal reflux disease)   . Hypercholesteremia   . Hyperlipidemia   . Personal history of malignant neoplasm of breast     Past Surgical History:  Procedure Laterality Date  . BREAST EXCISIONAL BIOPSY Right 1997   pos  . BREAST SURGERY Right 1997   lumpectomy  . COLONOSCOPY  2008  . COLONOSCOPY WITH PROPOFOL N/A 07/21/2015   Procedure: COLONOSCOPY WITH  PROPOFOL;  Surgeon: Lucilla Lame, MD;  Location: [facility];  Service: Endoscopy;  Laterality: N/A;   Family History:  Family History  Problem Relation Age of Onset  . Anxiety disorder Brother   . Obesity Brother   . COPD Brother   . Kidney disease Mother   . Heart attack Father   . Hypertension Father   . Alcohol abuse Father   . Depression Brother   . Anxiety disorder Brother   . Breast cancer Maternal Grandmother    Social History:  Social History   Social History  . Marital status: Widowed    Spouse name: N/A  . Number of children: N/A  . Years of education: N/A   Social History Main Topics  . Smoking status: Current Some Day Smoker    Packs/day: 1.00    Years: 5.00    Types: Cigarettes    Start date: 11/25/2009  . Smokeless tobacco: Never Used  . Alcohol use 0.0 oz/week     Comment: rarely  . Drug use: No  . Sexual activity: No   Other Topics Concern  . None   Social History Narrative  . None   Additional History:  Currently her son is living with her and patient is trying to help him move out into an apartment. 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  Assessment:  Musculoskeletal: Strength & Muscle Tone: within normal limits Gait & Station: normal Patient leans: N/A  Psychiatric Specialty Exam: HPI  ROS  Blood pressure 116/80, pulse 85, temperature 98 F (36.7 C), temperature source Oral, height 5\' 4"  (1.626 m), weight 184 lb 3.2 oz (83.6 kg).Body mass index is 31.62 kg/m.  General Appearance: Casual  Eye Contact:  Good  Speech:  Clear and Coherent  Volume:  Normal  Mood:  Euthymic  Affect:  Congruent  Thought Process:  Coherent  Orientation:  Full (Time, Place, and Person)  Thought Content:  WDL  Suicidal Thoughts:  No  Homicidal Thoughts:  No  Memory:  Immediate;   Fair  Judgement:  Fair  Insight:  Fair  Psychomotor Activity:  Decreased  Concentration:  Fair  Recall:  [employer] of Knowledge: Fair   Language: Fair  Akathisia:  No  Handed:  Right  AIMS (if indicated):    Assets:  Communication Skills Desire for Improvement Social Support  ADL's:  Intact  Cognition: WNL  Sleep: 6- 7 with trazodone    Is the patient at risk to self?  No. Has the patient been a risk to self in the past 6 months?  No. Has the patient been a risk to self within the distant past?  No. Is the patient a risk to others?  No. Has the patient been a risk to others in the past 6 months?  No. Has the patient been a risk to others within the distant past?  No.  Current Medications: Current Outpatient Prescriptions  Medication Sig Dispense Refill  . alendronate (FOSAMAX) 70 MG tablet Take 1 tablet (70 mg total) by mouth once a week. 12 tablet 3  . aspirin 81 MG chewable tablet Chew 81 mg by mouth daily. am    . Calcium Carbonate-Vitamin D (CALCIUM + D PO) Take 1 tablet by mouth daily. am    . hydrOXYzine (ATARAX/VISTARIL) 25 MG tablet Take 1 tablet (25 mg total) by mouth at bedtime. (Patient taking differently: Take 25 mg by mouth as needed. ) 90 tablet 2  . mirtazapine (REMERON) 30 MG tablet Take 1 tablet (30 mg total) by mouth at bedtime. 90 tablet 3  . Multiple Vitamin (MULTIVITAMIN) tablet Take 1 tablet by mouth daily. am    . omeprazole (PRILOSEC) 20 MG capsule Take 1 capsule (20 mg total) by mouth daily. (Patient taking differently: Take 20 mg by mouth daily. am) 90 capsule 1  . simvastatin (ZOCOR) 20 MG tablet Take 1 tablet (20 mg total) by mouth daily. (Patient taking differently: Take 20 mg by mouth daily. bedtime) 90 tablet 1   No current facility-administered medications for this visit.     Medical Decision Making:  Review of Psycho-Social Stressors (1) and Review and summation of old records (2)  Treatment Plan Summary:Medication management   Depression  She will also continue on Remeron 30 mg by mouth daily at bedtime . Medication refill for the 90 day supply  She also takes Atarax 25 mg  daily at bedtime when necessary Medication refill for the 90 day supply   More than 50% of the time spent in psychoeducation, counseling and coordination of care.       This note was generated in part or whole with voice recognition software. Voice regonition is usually quite accurate but there are transcription errors that can and very often do occur. I apologize for any typographical errors that were not detected and corrected.    Rainey Pines,  MD    09/11/2015, 10:26 AM

## 2015-12-08 ENCOUNTER — Ambulatory Visit (INDEPENDENT_AMBULATORY_CARE_PROVIDER_SITE_OTHER): Payer: Federal, State, Local not specified - PPO | Admitting: Psychiatry

## 2015-12-08 ENCOUNTER — Encounter: Payer: Self-pay | Admitting: Psychiatry

## 2015-12-08 VITALS — BP 112/72 | HR 80 | Ht 65.0 in | Wt 179.4 lb

## 2015-12-08 DIAGNOSIS — F330 Major depressive disorder, recurrent, mild: Secondary | ICD-10-CM | POA: Diagnosis not present

## 2015-12-08 MED ORDER — HYDROXYZINE HCL 25 MG PO TABS: 25.0000 mg | ORAL_TABLET | Freq: Every day | ORAL | 2 refills | Status: DC

## 2015-12-08 MED ORDER — MIRTAZAPINE 30 MG PO TABS: 30.0000 mg | ORAL_TABLET | Freq: Every day | ORAL | 3 refills | Status: DC

## 2015-12-08 NOTE — Progress Notes (Signed)
BH MD/PA/NP OP Progress Note  12/08/2015 9:01 AM Chelsey [name]  MRN:  [id number]  Subjective:  Patient is a 65 year old female who presented for the follow-up appointment. She  Reported he has been feeling anxious. She was talking about her son in detail who has history of alcohol use. He took out her car and was involved in a car accident. Patient reported that she was also charged with abating the incident although she was not involved. Patient reported that she has to go for the court date. Patient reported that she thinks that her son might have to go for the jail time as this is his third DWI. Patient feels calm and alert. She reported that she has started using the hydroxyzine. However she is compliant with her medications no acute issues noted. She appeared calm and alert. She denied having any perceptual disturbances. She is looking forward for the holidays. She still works in the [employer]   She reported that she has been doing well and is sleeping good at night.    Patient denied having any suicidal homicidal ideations or plans.   Chief Complaint:  Chief Complaint    Follow-up     Visit Diagnosis:     ICD-9-CM ICD-10-CM   1. Mild episode of recurrent major depressive disorder ([place]) 296.31 F33.0     Past Medical History:  Past Medical History:  Diagnosis Date  . Breast cancer ([place]) 1997   right breast, radiation  . Bronchitis    recent/ 06/09/15 [facility] x[facility]/resolved  . Cancer [facility]) 1997   right lumpectomy,L/Ad/R   . GERD (gastroesophageal reflux disease)   . Hypercholesteremia   . Hyperlipidemia   . Personal history of malignant neoplasm of breast     Past Surgical History:  Procedure Laterality Date  . BREAST EXCISIONAL BIOPSY Right 1997   pos  . BREAST SURGERY Right 1997   lumpectomy  . COLONOSCOPY  2008  . COLONOSCOPY WITH PROPOFOL N/A 07/21/2015   Procedure: COLONOSCOPY WITH PROPOFOL;  Surgeon: Lucilla Lame, MD;  Location: [place];  Service: Endoscopy;  Laterality: N/A;   Family History:  Family History  Problem Relation Age of Onset  . Anxiety disorder Brother   . Obesity Brother   . COPD Brother   . Kidney disease Mother   . Heart attack Father   . Hypertension Father   . Alcohol abuse Father   . Depression Brother   . Anxiety disorder Brother   . Breast cancer Maternal Grandmother    Social History:  Social History   Social History  . Marital status: Widowed    Spouse name: N/A  . Number of children: N/A  . Years of education: N/A   Social History Main Topics  . Smoking status: Current Some Day Smoker    Packs/day: 0.25    Years: 5.00    Types: Cigarettes    Start date: 11/25/2009  . Smokeless tobacco: Never Used     Comment: Has tried patches and may go back on these again.  Says they work.   . Alcohol use No     Comment: rarely  . Drug use: No  . Sexual activity: No   Other Topics Concern  . None   Social History Narrative  . None   Additional History:  Currently her son is living with her and patient is trying to help him move out into an apartment. Patient  works in [employer] and is doing  well on her job.  Assessment:   Musculoskeletal: Strength & Muscle Tone: within normal limits Gait & Station: normal Patient leans: N/A  Psychiatric Specialty Exam: Medication Refill     ROS  Blood pressure 112/72, pulse 80, height 5\' 5"  (1.651 m), weight 179 lb 6.4 oz (81.4 kg).Body mass index is 29.85 kg/m.  General Appearance: Casual  Eye Contact:  Good  Speech:  Clear and Coherent  Volume:  Normal  Mood:  Euthymic  Affect:  Congruent  Thought Process:  Coherent  Orientation:  Full (Time, Place, and Person)  Thought Content:  WDL  Suicidal Thoughts:  No  Homicidal Thoughts:  No  Memory:  Immediate;   Fair  Judgement:  Fair  Insight:  Fair  Psychomotor Activity:  Decreased  Concentration:  Fair  Recall:  [employer] of Knowledge: Fair  Language: Fair   Akathisia:  No  Handed:  Right  AIMS (if indicated):    Assets:  Communication Skills Desire for Improvement Social Support  ADL's:  Intact  Cognition: WNL  Sleep: 6- 7 with trazodone    Is the patient at risk to self?  No. Has the patient been a risk to self in the past 6 months?  No. Has the patient been a risk to self within the distant past?  No. Is the patient a risk to others?  No. Has the patient been a risk to others in the past 6 months?  No. Has the patient been a risk to others within the distant past?  No.  Current Medications: Current Outpatient Prescriptions  Medication Sig Dispense Refill  . alendronate (FOSAMAX) 70 MG tablet Take 1 tablet (70 mg total) by mouth once a week. 12 tablet 3  . aspirin 81 MG chewable tablet Chew 81 mg by mouth daily. am    . Calcium Carbonate-Vitamin D (CALCIUM + D PO) Take 1 tablet by mouth daily. am    . hydrOXYzine (ATARAX/VISTARIL) 25 MG tablet Take 1 tablet (25 mg total) by mouth at bedtime. 90 tablet 2  . mirtazapine (REMERON) 30 MG tablet Take 1 tablet (30 mg total) by mouth at bedtime. 90 tablet 3  . Multiple Vitamin (MULTIVITAMIN) tablet Take 1 tablet by mouth daily. am    . omeprazole (PRILOSEC) 20 MG capsule Take 1 capsule (20 mg total) by mouth daily. (Patient taking differently: Take 20 mg by mouth daily. am) 90 capsule 1  . simvastatin (ZOCOR) 20 MG tablet Take 1 tablet (20 mg total) by mouth daily. (Patient taking differently: Take 20 mg by mouth daily. bedtime) 90 tablet 1   No current facility-administered medications for this visit.     Medical Decision Making:  Review of Psycho-Social Stressors (1) and Review and summation of old records (2)  Treatment Plan Summary:Medication management   Depression  She will also continue on Remeron 30 mg by mouth daily at bedtime . Medication refill for the 90 day supply  She also takes Atarax 25 mg daily at bedtime when necessary Medication refill for the 90 day supply    More than 50% of the time spent in psychoeducation, counseling and coordination of care.   Follow-up in 6 months or earlier depending on her symptoms  This note was generated in part or whole with voice recognition software. Voice regonition is usually quite accurate but there are transcription errors that can and very often do occur. I apologize for any typographical errors that were not detected and corrected.    Rainey Pines,  MD    12/08/2015, 9:01 AM

## 2016-01-05 ENCOUNTER — Other Ambulatory Visit: Payer: Self-pay

## 2016-01-05 ENCOUNTER — Ambulatory Visit (INDEPENDENT_AMBULATORY_CARE_PROVIDER_SITE_OTHER): Payer: Federal, State, Local not specified - PPO | Admitting: Unknown Physician Specialty

## 2016-01-05 ENCOUNTER — Encounter: Payer: Self-pay | Admitting: Unknown Physician Specialty

## 2016-01-05 VITALS — BP 130/83 | HR 81 | Temp 97.8°F | Ht 65.0 in | Wt 179.4 lb

## 2016-01-05 DIAGNOSIS — Z8639 Personal history of other endocrine, nutritional and metabolic disease: Secondary | ICD-10-CM | POA: Diagnosis not present

## 2016-01-05 DIAGNOSIS — Z1231 Encounter for screening mammogram for malignant neoplasm of breast: Secondary | ICD-10-CM

## 2016-01-05 DIAGNOSIS — Z23 Encounter for immunization: Secondary | ICD-10-CM

## 2016-01-05 LAB — LIPID PANEL PICCOLO, WAIVED
CHOL/HDL RATIO PICCOLO,WAIVE: 3 mg/dL
CHOLESTEROL PICCOLO, WAIVED: 177 mg/dL (ref <200)
HDL Chol Piccolo, Waived: 60 mg/dL (ref >59)
LDL Chol Calc Piccolo Waived: 102 mg/dL — ABNORMAL HIGH (ref <100)
TRIGLYCERIDES PICCOLO,WAIVED: 73 mg/dL (ref <150)
VLDL Chol Calc Piccolo,Waive: 15 mg/dL (ref <30)

## 2016-01-05 MED ORDER — SIMVASTATIN 20 MG PO TABS
20.0000 mg | ORAL_TABLET | Freq: Every day | ORAL | 1 refills | Status: DC
Start: 2016-01-05 — End: 2016-07-05

## 2016-01-05 NOTE — Progress Notes (Signed)
BP 130/83 (BP Location: Left Arm, Patient Position: Sitting, Cuff Size: Large)   Pulse 81   Temp 97.8 F (36.6 C)   Ht 5\' 5"  (1.651 m)   Wt 179 lb 6.4 oz (81.4 kg)   LMP  (LMP Unknown)   SpO2 99%   BMI 29.85 kg/m    Subjective:    Patient ID: Chelsey [name], female    DOB: [birth year], 65 y.o.   MRN: [id number]  HPI: Chelsey [name] is a 65 y.o. female  Chief Complaint  Patient presents with  . Gastroesophageal Reflux  . Hyperlipidemia    Elevated Cholesterol Using medications without problems No Muscle aches  Diet: Losing weight Exercise: No other than work   GERD Acid reflux controlled    Relevant past medical, surgical, family and social history reviewed and updated as indicated. Interim medical history since our last visit reviewed. Allergies and medications reviewed and updated.  Review of Systems  Per HPI unless specifically indicated above     Objective:    BP 130/83 (BP Location: Left Arm, Patient Position: Sitting, Cuff Size: Large)   Pulse 81   Temp 97.8 F (36.6 C)   Ht 5\' 5"  (1.651 m)   Wt 179 lb 6.4 oz (81.4 kg)   LMP  (LMP Unknown)   SpO2 99%   BMI 29.85 kg/m   Wt Readings from Last 3 Encounters:  01/05/16 179 lb 6.4 oz (81.4 kg)  07/21/15 177 lb (80.3 kg)  07/04/15 184 lb 3.2 oz (83.6 kg)    Physical Exam  Constitutional: She is oriented to person, place, and time. She appears well-developed and well-nourished. No distress.  HENT:  Head: Normocephalic and atraumatic.  Eyes: Conjunctivae and lids are normal. Right eye exhibits no discharge. Left eye exhibits no discharge. No scleral icterus.  Neck: Normal range of motion. Neck supple. No JVD present. Carotid bruit is not present.  Cardiovascular: Normal rate, regular rhythm and normal heart sounds.   Pulmonary/Chest: Effort normal and breath sounds normal.  Abdominal: Normal appearance. There is no splenomegaly or hepatomegaly.  Musculoskeletal: Normal range of motion.    Neurological: She is alert and oriented to person, place, and time.  Skin: Skin is warm, dry and intact. No rash noted. No pallor.  Psychiatric: She has a normal mood and affect. Her behavior is normal. Judgment and thought content normal.    Results for orders placed or performed in visit on 07/04/15  Hepatitis C antibody  Result Value Ref Range   Hep C Virus Ab <0.1 0.0 - 0.9 s/co ratio  HIV antibody  Result Value Ref Range   HIV Screen 4th Generation wRfx Non Reactive Non Reactive  CBC with Differential/Platelet  Result Value Ref Range   WBC 4.9 3.4 - 10.8 x10E3/uL   RBC 4.62 3.77 - 5.28 x10E6/uL   Hemoglobin 14.2 11.1 - 15.9 g/dL   Hematocrit 43.0 34.0 - 46.6 %   MCV 93 79 - 97 fL   MCH 30.7 26.6 - 33.0 pg   MCHC 33.0 31.5 - 35.7 g/dL   RDW 13.9 12.3 - 15.4 %   Platelets 286 150 - 379 x10E3/uL   Neutrophils 48 %   Lymphs 37 %   Monocytes 10 %   Eos 4 %   Basos 1 %   Neutrophils Absolute 2.3 1.4 - 7.0 x10E3/uL   Lymphocytes Absolute 1.8 0.7 - 3.1 x10E3/uL   Monocytes Absolute 0.5 0.1 - 0.9 x10E3/uL   EOS (ABSOLUTE) 0.2 0.0 -  0.4 x10E3/uL   Basophils Absolute 0.0 0.0 - 0.2 x10E3/uL   Immature Granulocytes 0 %   Immature Grans (Abs) 0.0 0.0 - 0.1 x10E3/uL  Comprehensive metabolic panel  Result Value Ref Range   Glucose 100 (H) 65 - 99 mg/dL   BUN 19 8 - 27 mg/dL   Creatinine, Ser 0.76 0.57 - 1.00 mg/dL   GFR calc non Af Amer 84 >59 mL/min/1.73   GFR calc Af Amer 97 >59 mL/min/1.73   BUN/Creatinine Ratio 25 12 - 28   Sodium 143 134 - 144 mmol/L   Potassium 4.8 3.5 - 5.2 mmol/L   Chloride 103 96 - 106 mmol/L   CO2 23 18 - 29 mmol/L   Calcium 9.5 8.7 - 10.3 mg/dL   Total Protein 6.6 6.0 - 8.5 g/dL   Albumin 4.0 3.6 - 4.8 g/dL   Globulin, Total 2.6 1.5 - 4.5 g/dL   Albumin/Globulin Ratio 1.5 1.2 - 2.2   Bilirubin Total 0.4 0.0 - 1.2 mg/dL   Alkaline Phosphatase 91 39 - 117 IU/L   AST 20 0 - 40 IU/L   ALT 18 0 - 32 IU/L  Lipid Panel w/o Chol/HDL Ratio  Result  Value Ref Range   Cholesterol, Total 247 (H) 100 - 199 mg/dL   Triglycerides 191 (H) 0 - 149 mg/dL   HDL 60 >39 mg/dL   VLDL Cholesterol Cal 38 5 - 40 mg/dL   LDL Calculated 149 (H) 0 - 99 mg/dL  TSH  Result Value Ref Range   TSH 4.200 0.450 - 4.500 uIU/mL  IGP, Aptima HPV, rfx 16/18,45  Result Value Ref Range   DIAGNOSIS: Comment    Specimen adequacy: Comment    CLINICIAN PROVIDED ICD10: Comment    Performed by: Comment    PAP SMEAR COMMENT .    Note: Comment    Test Methodology CANCELED    HPV Aptima Negative Negative      Assessment & Plan:   Problem List Items Addressed This Visit      Unprioritized   H/O hypercholesterolemia   Relevant Orders   Lipid Panel Piccolo, Waived   Comprehensive metabolic panel    Other Visit Diagnoses    Need for influenza vaccination    -  Primary   Relevant Orders   Flu Vaccine QUAD 36+ mos IM (Completed)       Follow up plan: Return in about 6 months (around 07/05/2016).

## 2016-01-05 NOTE — Patient Instructions (Signed)

## 2016-01-06 ENCOUNTER — Encounter: Payer: Self-pay | Admitting: Unknown Physician Specialty

## 2016-01-06 LAB — COMPREHENSIVE METABOLIC PANEL
ALK PHOS: 76 IU/L (ref 39–117)
ALT: 13 IU/L (ref 0–32)
AST: 17 IU/L (ref 0–40)
Albumin/Globulin Ratio: 1.7 (ref 1.2–2.2)
Albumin: 3.8 g/dL (ref 3.6–4.8)
BILIRUBIN TOTAL: 0.4 mg/dL (ref 0.0–1.2)
BUN/Creatinine Ratio: 17 (ref 12–28)
BUN: 11 mg/dL (ref 8–27)
CHLORIDE: 106 mmol/L (ref 96–106)
CO2: 24 mmol/L (ref 18–29)
Calcium: 9.1 mg/dL (ref 8.7–10.3)
Creatinine, Ser: 0.66 mg/dL (ref 0.57–1.00)
GFR calc non Af Amer: 94 mL/min/{1.73_m2} (ref >59)
GFR, EST AFRICAN AMERICAN: 108 mL/min/{1.73_m2} (ref >59)
GLUCOSE: 102 mg/dL — AB (ref 65–99)
Globulin, Total: 2.3 g/dL (ref 1.5–4.5)
Potassium: 5.1 mmol/L (ref 3.5–5.2)
Sodium: 143 mmol/L (ref 134–144)
TOTAL PROTEIN: 6.1 g/dL (ref 6.0–8.5)

## 2016-01-14 ENCOUNTER — Encounter: Payer: Self-pay | Admitting: Unknown Physician Specialty

## 2016-01-14 ENCOUNTER — Ambulatory Visit (INDEPENDENT_AMBULATORY_CARE_PROVIDER_SITE_OTHER): Payer: Federal, State, Local not specified - PPO | Admitting: Unknown Physician Specialty

## 2016-01-14 VITALS — BP 121/84 | HR 84 | Temp 98.6°F | Wt 176.0 lb

## 2016-01-14 DIAGNOSIS — R05 Cough: Secondary | ICD-10-CM | POA: Diagnosis not present

## 2016-01-14 DIAGNOSIS — J40 Bronchitis, not specified as acute or chronic: Secondary | ICD-10-CM

## 2016-01-14 DIAGNOSIS — R059 Cough, unspecified: Secondary | ICD-10-CM

## 2016-01-14 LAB — VERITOR FLU A/B WAIVED
Influenza A: NEGATIVE
Influenza B: NEGATIVE

## 2016-01-14 MED ORDER — DOXYCYCLINE HYCLATE 100 MG PO TABS: 100.0000 mg | ORAL_TABLET | Freq: Two times a day (BID) | ORAL | 0 refills | Status: DC

## 2016-01-14 MED ORDER — PREDNISONE 20 MG PO TABS: 40.0000 mg | ORAL_TABLET | Freq: Every day | ORAL | 0 refills | Status: DC

## 2016-01-14 NOTE — Progress Notes (Signed)
BP 121/84 (BP Location: Left Arm, Patient Position: Sitting, Cuff Size: Large)   Pulse 84   Temp 98.6 F (37 C)   Wt 176 lb (79.8 kg)   LMP  (LMP Unknown)   SpO2 97%   BMI 29.29 kg/m    Subjective:    Patient ID: Chelsey [name], female    DOB: 06-01-1950, 65 y.o.   MRN: [id number]  HPI: Chelsey [name] is a 65 y.o. female  Chief Complaint  Patient presents with  . URI    pt states she has been having body aches, headache, chills, cough and congestion. States symptoms started Monday.    URI   This is a new problem. Episode onset: 2 days. The problem has been gradually worsening. Associated symptoms include congestion, coughing, headaches, joint pain and sneezing. She has tried decongestant for the symptoms. The treatment provided no relief.     Relevant past medical, surgical, family and social history reviewed and updated as indicated. Interim medical history since our last visit reviewed. Allergies and medications reviewed and updated.  Review of Systems  HENT: Positive for congestion and sneezing.   Respiratory: Positive for cough.   Musculoskeletal: Positive for joint pain.  Neurological: Positive for headaches.    Per HPI unless specifically indicated above     Objective:    BP 121/84 (BP Location: Left Arm, Patient Position: Sitting, Cuff Size: Large)   Pulse 84   Temp 98.6 F (37 C)   Wt 176 lb (79.8 kg)   LMP  (LMP Unknown)   SpO2 97%   BMI 29.29 kg/m   Wt Readings from Last 3 Encounters:  01/14/16 176 lb (79.8 kg)  01/05/16 179 lb 6.4 oz (81.4 kg)  07/21/15 177 lb (80.3 kg)    Physical Exam  Constitutional: She is oriented to person, place, and time. She appears well-developed and well-nourished. No distress.  HENT:  Head: Normocephalic and atraumatic.  Right Ear: Tympanic membrane and ear canal normal.  Left Ear: Tympanic membrane and ear canal normal.  Nose: Rhinorrhea present. Right sinus exhibits no maxillary sinus tenderness and no  frontal sinus tenderness. Left sinus exhibits no maxillary sinus tenderness and no frontal sinus tenderness.  Mouth/Throat: Mucous membranes are normal. Posterior oropharyngeal erythema present.  Eyes: Conjunctivae and lids are normal. Right eye exhibits no discharge. Left eye exhibits no discharge. No scleral icterus.  Cardiovascular: Normal rate and regular rhythm.   Pulmonary/Chest: Effort normal. No respiratory distress. She has decreased breath sounds. She has wheezes.  Abdominal: Normal appearance. There is no splenomegaly or hepatomegaly.  Musculoskeletal: Normal range of motion.  Neurological: She is alert and oriented to person, place, and time.  Skin: Skin is intact. No rash noted. No pallor.  Psychiatric: She has a normal mood and affect. Her behavior is normal. Judgment and thought content normal.    Results for orders placed or performed in visit on 01/05/16  Lipid Panel Piccolo, [employer]  Result Value Ref Range   Cholesterol Piccolo, Waived 177 <200 mg/dL   HDL Chol Piccolo, Waived 60 >59 mg/dL   Triglycerides Piccolo,Waived 73 <150 mg/dL   Chol/HDL Ratio Piccolo,Waive 3.0 mg/dL   LDL Chol Calc Piccolo Waived 102 (H) <100 mg/dL   VLDL Chol Calc Piccolo,Waive 15 <30 mg/dL  Comprehensive metabolic panel  Result Value Ref Range   Glucose 102 (H) 65 - 99 mg/dL   BUN 11 8 - 27 mg/dL   Creatinine, Ser 0.66 0.57 - 1.00 mg/dL   GFR  calc non Af Amer 94 >59 mL/min/1.73   GFR calc Af Amer 108 >59 mL/min/1.73   BUN/Creatinine Ratio 17 12 - 28   Sodium 143 134 - 144 mmol/L   Potassium 5.1 3.5 - 5.2 mmol/L   Chloride 106 96 - 106 mmol/L   CO2 24 18 - 29 mmol/L   Calcium 9.1 8.7 - 10.3 mg/dL   Total Protein 6.1 6.0 - 8.5 g/dL   Albumin 3.8 3.6 - 4.8 g/dL   Globulin, Total 2.3 1.5 - 4.5 g/dL   Albumin/Globulin Ratio 1.7 1.2 - 2.2   Bilirubin Total 0.4 0.0 - 1.2 mg/dL   Alkaline Phosphatase 76 39 - 117 IU/L   AST 17 0 - 40 IU/L   ALT 13 0 - 32 IU/L      Assessment & Plan:    Problem List Items Addressed This Visit    None    Visit Diagnoses    Cough    -  Primary   Relevant Orders   Veritor Flu A/B Waived   Bronchitis           Follow up plan: Return if symptoms worsen or fail to improve.

## 2016-03-05 ENCOUNTER — Ambulatory Visit
Admission: RE | Admit: 2016-03-05 | Discharge: 2016-03-05 | Disposition: A | Discharge status: Home / Self Care | Payer: Federal, State, Local not specified - PPO | Source: Ambulatory Visit | Attending: General Surgery | Admitting: General Surgery

## 2016-03-05 DIAGNOSIS — Z1231 Encounter for screening mammogram for malignant neoplasm of breast: Secondary | ICD-10-CM

## 2016-03-09 ENCOUNTER — Encounter: Payer: Self-pay | Admitting: *Deleted

## 2016-03-11 ENCOUNTER — Ambulatory Visit (INDEPENDENT_AMBULATORY_CARE_PROVIDER_SITE_OTHER): Payer: Federal, State, Local not specified - PPO | Admitting: General Surgery

## 2016-03-11 ENCOUNTER — Encounter: Payer: Self-pay | Admitting: General Surgery

## 2016-03-11 VITALS — BP 110/74 | HR 78 | Resp 14 | Ht 66.0 in | Wt 176.0 lb

## 2016-03-11 DIAGNOSIS — Z853 Personal history of malignant neoplasm of breast: Secondary | ICD-10-CM | POA: Diagnosis not present

## 2016-03-11 NOTE — Patient Instructions (Addendum)
The patient is aware to call back for any questions or concerns. Patient will continue her annual bilateral screening mammograms with her PCP

## 2016-03-11 NOTE — Progress Notes (Signed)
Patient ID: Chelsey [name], female   DOB: 04-15-1950, 66 y.o.   MRN: [id number]  Chief Complaint  Patient presents with  . Follow-up    HPI Chelsey [name] is a 66 y.o. female who presents for her breast cancer follow up and a breast evaluation. The most recent mammogram was done on 03/05/2016 . No new breast issues. Patient does perform regular self breast checks and gets regular mammograms done.   I have reviewed the history of present illness with the patient.  HPI  Past Medical History:  Diagnosis Date  . Breast cancer ([place]) 1997   right breast, radiation  . Bronchitis    recent/ 06/09/15 had chest x[facility]/ [facility]/resolved  . Cancer [facility]) 1997   right lumpectomy,L/Ad/R   . GERD (gastroesophageal reflux disease)   . Hypercholesteremia   . Hyperlipidemia   . Personal history of malignant neoplasm of breast     Past Surgical History:  Procedure Laterality Date  . BREAST EXCISIONAL BIOPSY Right 1997   pos  . BREAST SURGERY Right 1997   lumpectomy  . COLONOSCOPY  2008  . COLONOSCOPY WITH PROPOFOL N/A 07/21/2015   Procedure: COLONOSCOPY WITH PROPOFOL;  Surgeon: Lucilla Lame, MD;  Location: [place];  Service: Endoscopy;  Laterality: N/A;    Family History  Problem Relation Age of Onset  . Anxiety disorder Brother   . Obesity Brother   . COPD Brother   . Kidney disease Mother   . Heart attack Father   . Hypertension Father   . Alcohol abuse Father   . Depression Brother   . Anxiety disorder Brother   . Breast cancer Maternal Grandmother     Social History Social History  Substance Use Topics  . Smoking status: Former Smoker    Packs/day: 0.25    Years: 5.00    Types: Cigarettes    Start date: 11/25/2009    Quit date: 03/09/2016  . Smokeless tobacco: Never Used     Comment: Has tried patches and may go back on these again.  Says they work.   . Alcohol use No     Comment: rarely    No Known Allergies  Current Outpatient Prescriptions   Medication Sig Dispense Refill  . alendronate (FOSAMAX) 70 MG tablet Take 1 tablet (70 mg total) by mouth once a week. 12 tablet 3  . aspirin 81 MG chewable tablet Chew 81 mg by mouth daily. am    . Calcium Carbonate-Vitamin D (CALCIUM + D PO) Take 1 tablet by mouth daily. am    . hydrOXYzine (ATARAX/VISTARIL) 25 MG tablet Take 1 tablet (25 mg total) by mouth at bedtime. (Patient taking differently: Take 25 mg by mouth every 6 (six) hours as needed. ) 90 tablet 2  . mirtazapine (REMERON) 30 MG tablet Take 1 tablet (30 mg total) by mouth at bedtime. 90 tablet 3  . Multiple Vitamin (MULTIVITAMIN) tablet Take 1 tablet by mouth daily. am    . omeprazole (PRILOSEC) 20 MG capsule Take 1 capsule (20 mg total) by mouth daily. (Patient taking differently: Take 20 mg by mouth daily. am) 90 capsule 1  . simvastatin (ZOCOR) 20 MG tablet Take 1 tablet (20 mg total) by mouth daily. bedtime 90 tablet 1   No current facility-administered medications for this visit.     Review of Systems Review of Systems  Constitutional: Negative.   Respiratory: Negative.   Cardiovascular: Negative.     Blood pressure 110/74, pulse 78, resp. rate  14, height 5\' 6"  (1.676 m), weight 176 lb (79.8 kg).  Physical Exam Physical Exam  Constitutional: She is oriented to person, place, and time. She appears well-developed and well-nourished.  HENT:  Mouth/Throat: Oropharynx is clear and moist.  Eyes: Conjunctivae are normal. No scleral icterus.  Neck: Neck supple.  Cardiovascular: Normal rate, regular rhythm and normal heart sounds.   Pulmonary/Chest: Effort normal and breath sounds normal. Right breast exhibits no inverted nipple, no mass, no nipple discharge, no skin change and no tenderness. Left breast exhibits no inverted nipple, no mass, no nipple discharge, no skin change and no tenderness.  Abdominal: Soft. Bowel sounds are normal. There is no tenderness.  Lymphadenopathy:    She has no cervical adenopathy.    She  has no axillary adenopathy.  Neurological: She is alert and oriented to person, place, and time.  Skin: Skin is warm and dry.  Psychiatric: Her behavior is normal.    Data Reviewed Mammogram reviewed and stable.  Assessment    Stable exam, History of right breast cancer-1997, s/p right lumpectomy, axillary dissection and radiation.    Plan    Patient will continue her annual bilateral screening mammograms with her PCP.        This information has been scribed by Karie Fetch RN, BSN,BC.    SANKAR,SEEPLAPUTHUR G 03/15/2016, 8:50 AM

## 2016-03-30 ENCOUNTER — Other Ambulatory Visit: Payer: Self-pay

## 2016-03-30 MED ORDER — OMEPRAZOLE 20 MG PO CPDR: 20.0000 mg | DELAYED_RELEASE_CAPSULE | Freq: Every day | ORAL | 1 refills | Status: DC

## 2016-05-07 ENCOUNTER — Encounter: Payer: Self-pay | Admitting: Unknown Physician Specialty

## 2016-05-07 ENCOUNTER — Ambulatory Visit (INDEPENDENT_AMBULATORY_CARE_PROVIDER_SITE_OTHER): Payer: Federal, State, Local not specified - PPO | Admitting: Unknown Physician Specialty

## 2016-05-07 VITALS — BP 124/84 | HR 79 | Temp 98.1°F | Wt 174.4 lb

## 2016-05-07 DIAGNOSIS — R109 Unspecified abdominal pain: Secondary | ICD-10-CM

## 2016-05-07 LAB — UA/M W/RFLX CULTURE, ROUTINE
Bilirubin, UA: NEGATIVE
Glucose, UA: NEGATIVE
Ketones, UA: NEGATIVE
LEUKOCYTES UA: NEGATIVE
Nitrite, UA: NEGATIVE
PH UA: 6 (ref 5.0–7.5)
Specific Gravity, UA: >1.03 — ABNORMAL HIGH (ref 1.005–1.030)
Urobilinogen, Ur: 1 mg/dL (ref 0.2–1.0)

## 2016-05-07 LAB — MICROSCOPIC EXAMINATION

## 2016-05-07 MED ORDER — OMEPRAZOLE 20 MG PO CPDR: 20.0000 mg | DELAYED_RELEASE_CAPSULE | Freq: Every day | ORAL | 1 refills | Status: DC

## 2016-05-07 NOTE — Progress Notes (Signed)
BP 124/84 (BP Location: Left Arm, Patient Position: Sitting, Cuff Size: Normal)   Pulse 79   Temp 98.1 F (36.7 C)   Wt 174 lb 6.4 oz (79.1 kg)   LMP  (LMP Unknown)   SpO2 98%   BMI 28.15 kg/m    Subjective:    Patient ID: Chelsey [name], female    DOB: 18-May-1950, 66 y.o.   MRN: [id number]  HPI: Chelsey [name] is a 66 y.o. female  Chief Complaint  Patient presents with  . Abdominal Pain    pt states she has been feeling full and bloated for the past 2 to 3 weeks. States she can drink a can drink and feel full.    Abdominal pain States 6 month history of occasional pain but mostly uncomfortable.  States she hurts every night for about 2 hours across her abdoman. States last night it was worse.  In general, during the day she is bloated with early satiety.  No fever.  Harder but daily stools.  No nausea or vomiting.  Recent colonooscopy in the past year.  Already taking Omeprazole.  No problems urinating.    Past Medical History:  Diagnosis Date  . Breast cancer ([place]) 1997   right breast, radiation  . Bronchitis    recent/ 06/09/15 [facility] x[facility]/resolved  . Cancer [facility]) 1997   right lumpectomy,L/Ad/R   . GERD (gastroesophageal reflux disease)   . Hypercholesteremia   . Hyperlipidemia   . Personal history of malignant neoplasm of breast    Past Surgical History:  Procedure Laterality Date  . BREAST EXCISIONAL BIOPSY Right 1997   pos  . BREAST SURGERY Right 1997   lumpectomy  . COLONOSCOPY  2008  . COLONOSCOPY WITH PROPOFOL N/A 07/21/2015   Procedure: COLONOSCOPY WITH PROPOFOL;  Surgeon: Lucilla Lame, MD;  Location: [facility];  Service: Endoscopy;  Laterality: N/A;    Relevant past medical, surgical, family and social history reviewed and updated as indicated. Interim medical history since our last visit reviewed. Allergies and medications reviewed and updated.  Review of Systems  Constitutional: Negative.   HENT: Negative.   Eyes:  Negative.   Respiratory: Negative.   Cardiovascular: Negative.   Genitourinary: Negative.   Musculoskeletal: Negative.   Allergic/Immunologic: Negative.   Neurological: Negative.   Hematological: Negative.   Psychiatric/Behavioral: Negative.     Per HPI unless specifically indicated above     Objective:    BP 124/84 (BP Location: Left Arm, Patient Position: Sitting, Cuff Size: Normal)   Pulse 79   Temp 98.1 F (36.7 C)   Wt 174 lb 6.4 oz (79.1 kg)   LMP  (LMP Unknown)   SpO2 98%   BMI 28.15 kg/m   Wt Readings from Last 3 Encounters:  05/07/16 174 lb 6.4 oz (79.1 kg)  03/11/16 176 lb (79.8 kg)  01/14/16 176 lb (79.8 kg)    Physical Exam  Constitutional: She is oriented to person, place, and time. She appears well-developed and well-nourished. No distress.  HENT:  Head: Normocephalic and atraumatic.  Eyes: Conjunctivae and lids are normal. Right eye exhibits no discharge. Left eye exhibits no discharge. No scleral icterus.  Neck: Normal range of motion. Neck supple. No JVD present. Carotid bruit is not present.  Cardiovascular: Normal rate, regular rhythm and normal heart sounds.   Pulmonary/Chest: Effort normal and breath sounds normal.  Abdominal: Soft. Normal appearance and bowel sounds are normal. There is no splenomegaly or hepatomegaly. There is  no tenderness. There is no rigidity, no rebound, no guarding, no tenderness at McBurney's point and negative Murphy's sign.  Musculoskeletal: Normal range of motion.  Neurological: She is alert and oriented to person, place, and time.  Skin: Skin is warm, dry and intact. No rash noted. No pallor.  Psychiatric: She has a normal mood and affect. Her behavior is normal. Judgment and thought content normal.    Results for orders placed or performed in visit on 01/14/16  Veritor Flu A/B Waived  Result Value Ref Range   Influenza A Negative Negative   Influenza B Negative Negative      Assessment & Plan:   Problem List  Items Addressed This Visit    None    Visit Diagnoses    Abdominal pain, unspecified abdominal location    -  Primary   New and worsenting problem.  Check Amylase, Lipase, CBC.  Order abdominal US.  Increase Omeprazole to BID.  discussed when to go to the ER for evaluation   Relevant Medications   omeprazole (PRILOSEC) 20 MG capsule   Other Relevant Orders   UA/M w/rflx Culture, Routine   CBC with Differential/Platelet   Amylase   Lipase   US Abdomen Complete       Follow up plan: Return for witll call with results.

## 2016-05-08 LAB — CBC WITH DIFFERENTIAL/PLATELET
BASOS: 1 %
Basophils Absolute: 0 10*3/uL (ref 0.0–0.2)
EOS (ABSOLUTE): 0.2 10*3/uL (ref 0.0–0.4)
Eos: 3 %
Hematocrit: 39.6 % (ref 34.0–46.6)
Hemoglobin: 13.4 g/dL (ref 11.1–15.9)
IMMATURE GRANS (ABS): 0 10*3/uL (ref 0.0–0.1)
Immature Granulocytes: 0 %
LYMPHS: 34 %
Lymphocytes Absolute: 2.1 10*3/uL (ref 0.7–3.1)
MCH: 30.3 pg (ref 26.6–33.0)
MCHC: 33.8 g/dL (ref 31.5–35.7)
MCV: 90 fL (ref 79–97)
Monocytes Absolute: 0.5 10*3/uL (ref 0.1–0.9)
Monocytes: 8 %
NEUTROS ABS: 3.4 10*3/uL (ref 1.4–7.0)
Neutrophils: 54 %
PLATELETS: 326 10*3/uL (ref 150–379)
RBC: 4.42 x10E6/uL (ref 3.77–5.28)
RDW: 13.2 % (ref 12.3–15.4)
WBC: 6.2 10*3/uL (ref 3.4–10.8)

## 2016-05-08 LAB — LIPASE: Lipase: 34 U/L (ref 14–72)

## 2016-05-08 LAB — AMYLASE: Amylase: 38 U/L (ref 31–124)

## 2016-05-10 ENCOUNTER — Encounter: Payer: Self-pay | Admitting: Unknown Physician Specialty

## 2016-05-17 ENCOUNTER — Ambulatory Visit
Admission: RE | Admit: 2016-05-17 | Discharge: 2016-05-17 | Disposition: A | Discharge status: Home / Self Care | Payer: Federal, State, Local not specified - PPO | Source: Ambulatory Visit | Attending: Unknown Physician Specialty | Admitting: Unknown Physician Specialty

## 2016-05-17 ENCOUNTER — Telehealth: Payer: Self-pay | Admitting: Unknown Physician Specialty

## 2016-05-17 DIAGNOSIS — R109 Unspecified abdominal pain: Secondary | ICD-10-CM | POA: Diagnosis not present

## 2016-05-17 DIAGNOSIS — K829 Disease of gallbladder, unspecified: Secondary | ICD-10-CM | POA: Insufficient documentation

## 2016-05-17 NOTE — Telephone Encounter (Signed)
Called to discuss [place].  Left message

## 2016-05-17 NOTE — Telephone Encounter (Signed)
Patient came by the office to see who had called her. I explained Malachy Mood had and I would have her call back if possible this afternoon.   Thanks

## 2016-05-18 ENCOUNTER — Telehealth: Payer: Self-pay | Admitting: Unknown Physician Specialty

## 2016-05-18 NOTE — Telephone Encounter (Signed)
Stomach not bothering since taking extra meds.  Discussed [place] with an MRI recommended. Will not schedule at this time and consider at physical or if bothering her again.

## 2016-05-20 ENCOUNTER — Telehealth: Payer: Self-pay | Admitting: Unknown Physician Specialty

## 2016-05-20 DIAGNOSIS — R1011 Right upper quadrant pain: Secondary | ICD-10-CM

## 2016-05-20 DIAGNOSIS — R935 Abnormal findings on diagnostic imaging of other abdominal regions, including retroperitoneum: Secondary | ICD-10-CM

## 2016-05-20 NOTE — Telephone Encounter (Signed)
Patient came in to see if Chelsey [name] would order the MRI for her.  She is continuing to have the stomach issues.   [phone] she can be reached afternoon tomorrow.  Thanks

## 2016-05-21 DIAGNOSIS — R935 Abnormal findings on diagnostic imaging of other abdominal regions, including retroperitoneum: Secondary | ICD-10-CM | POA: Insufficient documentation

## 2016-05-21 DIAGNOSIS — R109 Unspecified abdominal pain: Secondary | ICD-10-CM | POA: Insufficient documentation

## 2016-05-21 NOTE — Telephone Encounter (Signed)
Routing to provider  

## 2016-05-21 NOTE — Telephone Encounter (Signed)
Called and let patient know that order for MRI has been placed.

## 2016-05-21 NOTE — Telephone Encounter (Signed)
done

## 2016-05-31 ENCOUNTER — Ambulatory Visit (INDEPENDENT_AMBULATORY_CARE_PROVIDER_SITE_OTHER): Payer: Federal, State, Local not specified - PPO | Admitting: Psychiatry

## 2016-05-31 ENCOUNTER — Encounter: Payer: Self-pay | Admitting: Psychiatry

## 2016-05-31 VITALS — BP 104/71 | HR 90 | Temp 97.8°F | Wt 177.0 lb

## 2016-05-31 DIAGNOSIS — F330 Major depressive disorder, recurrent, mild: Secondary | ICD-10-CM | POA: Diagnosis not present

## 2016-05-31 MED ORDER — MIRTAZAPINE 30 MG PO TABS: 30.0000 mg | ORAL_TABLET | Freq: Every day | ORAL | 3 refills | Status: DC

## 2016-05-31 MED ORDER — HYDROXYZINE HCL 25 MG PO TABS: 25.0000 mg | ORAL_TABLET | Freq: Every day | ORAL | 2 refills | Status: DC

## 2016-05-31 NOTE — Progress Notes (Signed)
BH MD/PA/NP OP Progress Note  05/31/2016 9:10 AM ELLOISE [name]  MRN:  [id number]  Subjective:  Patient is a 66 year old female who presented for the follow-up appointment. She  did that she has been doing well on her medications. She is currently working 50 hours. She reported that she has to take her son to appointment this morning. She appeared calm and alert during the interview. She currently denied having any suicidal homicidal ideations or plans. She reported that she has been sleeping well. She has been taking hydroxyzine on a when necessary basis. Does not have any side effects of the medications. We discussed about her medications in detail and she agreed with the plan. She follows with her appointment on a regular basis.     She reported that she has been doing well and is sleeping good at night.    Patient denied having any suicidal homicidal ideations or plans.   Chief Complaint:  Chief Complaint    Follow-up; Medication Refill     Visit Diagnosis:     ICD-9-CM ICD-10-CM   1. Mild episode of recurrent major depressive disorder ([place]) 296.31 F33.0     Past Medical History:  Past Medical History:  Diagnosis Date  . Breast cancer ([place]) 1997   right breast, radiation  . Bronchitis    recent/ 06/09/15 [facility]/resolved  . Cancer [facility]) 1997   right lumpectomy,L/Ad/R   . GERD (gastroesophageal reflux disease)   . Hypercholesteremia   . Hyperlipidemia   . Personal history of malignant neoplasm of breast     Past Surgical History:  Procedure Laterality Date  . BREAST EXCISIONAL BIOPSY Right 1997   pos  . BREAST SURGERY Right 1997   lumpectomy  . COLONOSCOPY  2008  . COLONOSCOPY WITH PROPOFOL N/A 07/21/2015   Procedure: COLONOSCOPY WITH PROPOFOL;  Surgeon: Lucilla Lame, MD;  Location: [facility];  Service: Endoscopy;  Laterality: N/A;   Family History:  Family History  Problem Relation Age of Onset  . Anxiety disorder Brother   .  Obesity Brother   . COPD Brother   . Kidney disease Mother   . Heart attack Father   . Hypertension Father   . Alcohol abuse Father   . Depression Brother   . Anxiety disorder Brother   . Breast cancer Maternal Grandmother    Social History:  Social History   Social History  . Marital status: Widowed    Spouse name: N/A  . Number of children: N/A  . Years of education: N/A   Social History Main Topics  . Smoking status: Former Smoker    Packs/day: 0.25    Years: 5.00    Types: Cigarettes    Start date: 11/25/2009    Quit date: 03/09/2016  . Smokeless tobacco: Never Used     Comment: Has tried patches and may go back on these again.  Says they work.   . Alcohol use No     Comment: rarely  . Drug use: No  . Sexual activity: No   Other Topics Concern  . None   Social History Narrative  . None   Additional History:  Currently her son is living with her and patient is trying to help him move out into an apartment. 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  Assessment:   Musculoskeletal: Strength & Muscle Tone: within normal limits Gait & Station: normal Patient leans: N/A  Psychiatric Specialty Exam:  Medication Refill     ROS  Blood pressure 104/71, pulse 90, temperature 97.8 F (36.6 C), temperature source Oral, weight 177 lb (80.3 kg).Body mass index is 28.57 kg/m.  General Appearance: Casual  Eye Contact:  Good  Speech:  Clear and Coherent  Volume:  Normal  Mood:  Euthymic  Affect:  Congruent  Thought Process:  Coherent  Orientation:  Full (Time, Place, and Person)  Thought Content:  WDL  Suicidal Thoughts:  No  Homicidal Thoughts:  No  Memory:  Immediate;   Fair  Judgement:  Fair  Insight:  Fair  Psychomotor Activity:  Decreased  Concentration:  Fair  Recall:  [employer] of Knowledge: Fair  Language: Fair  Akathisia:  No  Handed:  Right  AIMS (if indicated):    Assets:  Communication Skills Desire for Improvement Social  Support  ADL's:  Intact  Cognition: WNL  Sleep: 6- 7 with trazodone    Is the patient at risk to self?  No. Has the patient been a risk to self in the past 6 months?  No. Has the patient been a risk to self within the distant past?  No. Is the patient a risk to others?  No. Has the patient been a risk to others in the past 6 months?  No. Has the patient been a risk to others within the distant past?  No.  Current Medications: Current Outpatient Prescriptions  Medication Sig Dispense Refill  . alendronate (FOSAMAX) 70 MG tablet Take 1 tablet (70 mg total) by mouth once a week. 12 tablet 3  . aspirin 81 MG chewable tablet Chew 81 mg by mouth daily. am    . Calcium Carbonate-Vitamin D (CALCIUM + D PO) Take 1 tablet by mouth daily. am    . hydrOXYzine (ATARAX/VISTARIL) 25 MG tablet Take 1 tablet (25 mg total) by mouth at bedtime. 90 tablet 2  . mirtazapine (REMERON) 30 MG tablet Take 1 tablet (30 mg total) by mouth at bedtime. 90 tablet 3  . Multiple Vitamin (MULTIVITAMIN) tablet Take 1 tablet by mouth daily. am    . omeprazole (PRILOSEC) 20 MG capsule Take 1 capsule (20 mg total) by mouth daily. 180 capsule 1  . simvastatin (ZOCOR) 20 MG tablet Take 1 tablet (20 mg total) by mouth daily. bedtime 90 tablet 1   No current facility-administered medications for this visit.     Medical Decision Making:  Review of Psycho-Social Stressors (1) and Review and summation of old records (2)  Treatment Plan Summary:Medication management   Depression  She will also continue on Remeron 30 mg by mouth daily at bedtime . Medication refill for the 90 day supply  She also takes Atarax 25 mg daily at bedtime when necessary Medication refill for the 90 day supply   More than 50% of the time spent in psychoeducation, counseling and coordination of care.   Follow-up in 3 months or earlier depending on her symptoms  This note was generated in part or whole with voice recognition software. Voice  regonition is usually quite accurate but there are transcription errors that can and very often do occur. I apologize for any typographical errors that were not detected and corrected.    Rainey Pines, MD    05/31/2016, 9:10 AM

## 2016-06-02 ENCOUNTER — Other Ambulatory Visit: Payer: Self-pay | Admitting: Unknown Physician Specialty

## 2016-06-02 DIAGNOSIS — R1011 Right upper quadrant pain: Secondary | ICD-10-CM

## 2016-06-04 ENCOUNTER — Ambulatory Visit
Admission: RE | Admit: 2016-06-04 | Discharge: 2016-06-04 | Disposition: A | Discharge status: Home / Self Care | Payer: Federal, State, Local not specified - PPO | Source: Ambulatory Visit | Attending: Unknown Physician Specialty | Admitting: Unknown Physician Specialty

## 2016-06-04 ENCOUNTER — Other Ambulatory Visit: Payer: Self-pay | Admitting: Unknown Physician Specialty

## 2016-06-04 DIAGNOSIS — R1011 Right upper quadrant pain: Secondary | ICD-10-CM

## 2016-06-04 DIAGNOSIS — R935 Abnormal findings on diagnostic imaging of other abdominal regions, including retroperitoneum: Secondary | ICD-10-CM | POA: Diagnosis present

## 2016-06-04 LAB — POCT I-STAT CREATININE: CREATININE: 0.7 mg/dL (ref 0.44–1.00)

## 2016-06-04 MED ORDER — GADOBENATE DIMEGLUMINE 529 MG/ML IV SOLN
16.0000 mL | Freq: Once | INTRAVENOUS | Status: AC | PRN
  Administered 2016-06-04: 20 mL via INTRAVENOUS

## 2016-06-04 NOTE — Progress Notes (Signed)
Patient notified

## 2016-06-09 ENCOUNTER — Encounter: Payer: Self-pay | Admitting: General Surgery

## 2016-06-09 ENCOUNTER — Ambulatory Visit (INDEPENDENT_AMBULATORY_CARE_PROVIDER_SITE_OTHER): Payer: Federal, State, Local not specified - PPO | Admitting: General Surgery

## 2016-06-09 VITALS — BP 132/66 | HR 74 | Resp 12 | Ht 66.0 in | Wt 176.0 lb

## 2016-06-09 DIAGNOSIS — R1013 Epigastric pain: Secondary | ICD-10-CM | POA: Diagnosis not present

## 2016-06-09 NOTE — Progress Notes (Signed)
Patient ID: Chelsey [name], female   DOB: Apr 04, 1950, 66 y.o.   MRN: [id number]  Chief Complaint  Patient presents with  . Abdominal Pain    HPI Chelsey [name] is a 66 y.o. female is here today for an evaluation for abdominal pain. Patient states the pain started several months ago. The pain is more noticeable at night. The pain is located more middle portion of stomach. She states she feels bloated after she eats. Moving bowels regularly. She is more nauseous during the day. She describes the pain at times a 10/10. No foods trigger the pain.  She had an Ultrasound and MRI completed on 06/04/16.  HPI  Past Medical History:  Diagnosis Date  . Breast cancer ([place]) 1997   right breast, radiation  . Bronchitis    recent/ 06/09/15 [facility]/resolved  . Cancer [facility]) 1997   right lumpectomy,L/Ad/R   . GERD (gastroesophageal reflux disease)   . Hypercholesteremia   . Hyperlipidemia   . Personal history of malignant neoplasm of breast     Past Surgical History:  Procedure Laterality Date  . BREAST EXCISIONAL BIOPSY Right 1997   pos  . BREAST SURGERY Right 1997   lumpectomy  . COLONOSCOPY  2008  . COLONOSCOPY WITH PROPOFOL N/A 07/21/2015   Procedure: COLONOSCOPY WITH PROPOFOL;  Surgeon: Lucilla Lame, MD;  Location: [facility];  Service: Endoscopy;  Laterality: N/A;    Family History  Problem Relation Age of Onset  . Anxiety disorder Brother   . Obesity Brother   . COPD Brother   . Kidney disease Mother   . Heart attack Father   . Hypertension Father   . Alcohol abuse Father   . Depression Brother   . Anxiety disorder Brother   . Breast cancer Maternal Grandmother     Social History Social History  Substance Use Topics  . Smoking status: Former Smoker    Packs/day: 0.25    Years: 5.00    Types: Cigarettes    Start date: 11/25/2009    Quit date: 03/09/2016  . Smokeless tobacco: Never Used     Comment: Has tried patches and may go back on  these again.  Says they work.   . Alcohol use No     Comment: rarely    No Known Allergies  Current Outpatient Prescriptions  Medication Sig Dispense Refill  . alendronate (FOSAMAX) 70 MG tablet Take 1 tablet (70 mg total) by mouth once a week. 12 tablet 3  . aspirin 81 MG chewable tablet Chew 81 mg by mouth daily. am    . Calcium Carbonate-Vitamin D (CALCIUM + D PO) Take 1 tablet by mouth daily. am    . hydrOXYzine (ATARAX/VISTARIL) 25 MG tablet Take 1 tablet (25 mg total) by mouth at bedtime. 90 tablet 2  . mirtazapine (REMERON) 30 MG tablet Take 1 tablet (30 mg total) by mouth at bedtime. 90 tablet 3  . Multiple Vitamin (MULTIVITAMIN) tablet Take 1 tablet by mouth daily. am    . omeprazole (PRILOSEC) 20 MG capsule Take 1 capsule (20 mg total) by mouth daily. 180 capsule 1  . simvastatin (ZOCOR) 20 MG tablet Take 1 tablet (20 mg total) by mouth daily. bedtime 90 tablet 1   No current facility-administered medications for this visit.     Review of Systems Review of Systems  Constitutional: Negative.   Respiratory: Negative.   Cardiovascular: Negative.   Gastrointestinal: Positive for abdominal pain and nausea.  Blood pressure 132/66, pulse 74, resp. rate 12, height 5\' 6"  (1.676 m), weight 176 lb (79.8 kg).  Physical Exam Physical Exam  Constitutional: She is oriented to person, place, and time. She appears well-developed and well-nourished.  HENT:  Mouth/Throat: Oropharynx is clear and moist.  Eyes: Conjunctivae are normal. No scleral icterus.  Neck: Neck supple.  Cardiovascular: Normal rate, regular rhythm and normal heart sounds.   Pulmonary/Chest: Effort normal and breath sounds normal.  Abdominal: Soft. Bowel sounds are normal. There is no tenderness.  Lymphadenopathy:    She has no cervical adenopathy.  Neurological: She is alert and oriented to person, place, and time.  Skin: Skin is warm and dry.  Psychiatric: Her behavior is normal.    Data  Reviewed Progress notes, labs and imaging studies  Assessment    Epigastric pain Dyspepsia    Mild gallbladder wall thickening noted,no stones. Although her pain could be from chronic cholecystitis, need to rule out a gastric source Plan    Discussed upper endoscopy, risk and benefits in detail with the patient. Possible biopsy prn: Information regarding the procedure, including its potential risks and complications was verbally given to the patient. Educational information regarding upper endoscopy was given to the patient. Written instructions for how to complete the prep was provided.      HPI, Physical Exam, Assessment and Plan have been scribed under the direction and in the presence of Mckinley Jewel, MD  Karie Fetch, RN  I have completed the exam and reviewed the above documentation for accuracy and completeness.  I agree with the above.  Haematologist has been used and any errors in dictation or transcription are unintentional.  Chelsey [name] G. Jamal Collin, M.D., F.A.C.S.   Junie Panning G 06/09/2016, 7:30 PM  Patient has been scheduled for an upper endoscopy on 06-16-16 at [facility]. Upper endoscopy instructions have been reviewed with the patient. This patient is aware to call the office if they have further questions.   Dominga Ferry, CMA

## 2016-06-09 NOTE — Patient Instructions (Signed)
Upper Endoscopy Upper endoscopy is a procedure to look inside the upper GI (gastrointestinal) tract. The upper GI tract is made up of:  The tube that carries food and liquid from your throat to your stomach (esophagus).  The stomach.  The first part of your small intestine (duodenum).  This procedure is also called esophagogastroduodenoscopy (EGD) or gastroscopy. In this procedure, your health care provider passes a thin, flexible tube (endoscope) through your mouth and down your esophagus into your stomach. A small camera is attached to the end of the tube. Images from the camera appear on a monitor in the exam room. During this procedure, your health care provider may also remove a small piece of tissue to be sent to a lab and examined under a microscope (biopsy). Your health care provider may do an upper endoscopy to diagnose cancers of the upper GI tract. You may also have this procedure to find the cause of other conditions, such as:  Stomach pain.  Heartburn.  Pain or problems when swallowing.  Nausea and vomiting.  Stomach bleeding.  Stomach ulcers.  Tell a health care provider about:  Any allergies you have.  All medicines you are taking, including vitamins, herbs, eye drops, creams, and over-the-counter medicines.  Any problems you or family members have had with anesthetic medicines.  Any blood disorders you have.  Any surgeries you have had.  Any medical conditions you have.  Whether you are pregnant or may be pregnant. What are the risks? Generally, this is a safe procedure. However, problems may occur, including:  Infection.  Bleeding.  Allergic reactions to medicines.  A tear or hole (perforation) in the esophagus, stomach, or duodenum.  What happens before the procedure?  Follow instructions from your health care provider about eating or drinking restrictions.  Ask your health care provider about changing or stopping your regular medicines. This is  especially important if you are taking diabetes medicines or blood thinners.  Plan to have someone take you home after the procedure.  If you go home right after the procedure, plan to have someone with you for 24 hours. What happens during the procedure?  An IV tube will be inserted into one of your veins.  Your throat may be sprayed with medicine that numbs the area (local anesthetic).  You may be given a medicine to help you relax (sedative).  You will lie on your left side.  Your health care provider will pass the endoscope through your mouth and down your esophagus.  Your provider will use the scope to check the inside of your esophagus, stomach, and duodenum. Biopsies may be taken. The procedure may vary among health care providers and hospitals. What happens after the procedure?  Do not drive for 24 hours if you received a sedative.  Your blood pressure, heart rate, breathing rate, and blood oxygen level will be monitored often until the medicines you were given have worn off.  When your throat is no longer numb, you may be given some fluids to drink.  It is your responsibility to get the results of your procedure. Ask your health care provider or the department performing the procedure when your results will be ready. This information is not intended to replace advice given to you by your health care provider. Make sure you discuss any questions you have with your health care provider. Document Released: 01/09/2000 Document Revised: 06/24/2015 Document Reviewed: 10/24/2014 Elsevier Interactive Patient Education  2017 [employer] Inc[employer]  

## 2016-06-16 ENCOUNTER — Encounter
Admission: RE | Disposition: A | Discharge status: Home / Self Care | Payer: Self-pay | Source: Ambulatory Visit | Attending: General Surgery

## 2016-06-16 ENCOUNTER — Ambulatory Visit
Admission: RE | Admit: 2016-06-16 | Discharge: 2016-06-16 | Disposition: A | Discharge status: Home / Self Care | Payer: Federal, State, Local not specified - PPO | Source: Ambulatory Visit | Attending: General Surgery | Admitting: General Surgery

## 2016-06-16 ENCOUNTER — Ambulatory Visit: Payer: Federal, State, Local not specified - PPO | Admitting: Certified Registered"

## 2016-06-16 ENCOUNTER — Encounter: Payer: Self-pay | Admitting: *Deleted

## 2016-06-16 DIAGNOSIS — Z853 Personal history of malignant neoplasm of breast: Secondary | ICD-10-CM | POA: Insufficient documentation

## 2016-06-16 DIAGNOSIS — E7800 Pure hypercholesterolemia, unspecified: Secondary | ICD-10-CM | POA: Diagnosis not present

## 2016-06-16 DIAGNOSIS — R1011 Right upper quadrant pain: Secondary | ICD-10-CM | POA: Diagnosis not present

## 2016-06-16 DIAGNOSIS — Z7982 Long term (current) use of aspirin: Secondary | ICD-10-CM | POA: Insufficient documentation

## 2016-06-16 DIAGNOSIS — R1013 Epigastric pain: Secondary | ICD-10-CM | POA: Diagnosis not present

## 2016-06-16 DIAGNOSIS — K219 Gastro-esophageal reflux disease without esophagitis: Secondary | ICD-10-CM | POA: Diagnosis not present

## 2016-06-16 DIAGNOSIS — K317 Polyp of stomach and duodenum: Secondary | ICD-10-CM | POA: Insufficient documentation

## 2016-06-16 DIAGNOSIS — E785 Hyperlipidemia, unspecified: Secondary | ICD-10-CM | POA: Insufficient documentation

## 2016-06-16 DIAGNOSIS — K2970 Gastritis, unspecified, without bleeding: Secondary | ICD-10-CM | POA: Diagnosis not present

## 2016-06-16 DIAGNOSIS — I10 Essential (primary) hypertension: Secondary | ICD-10-CM | POA: Diagnosis not present

## 2016-06-16 DIAGNOSIS — Z79899 Other long term (current) drug therapy: Secondary | ICD-10-CM | POA: Diagnosis not present

## 2016-06-16 DIAGNOSIS — Z87891 Personal history of nicotine dependence: Secondary | ICD-10-CM | POA: Diagnosis not present

## 2016-06-16 HISTORY — PX: ESOPHAGOGASTRODUODENOSCOPY (EGD) WITH PROPOFOL: SHX5813

## 2016-06-16 SURGERY — ESOPHAGOGASTRODUODENOSCOPY (EGD) WITH PROPOFOL
Anesthesia: General

## 2016-06-16 MED ORDER — LIDOCAINE HCL 2 % IJ SOLN
INTRAMUSCULAR | Status: AC
  Filled 2016-06-16: qty 10

## 2016-06-16 MED ORDER — PROPOFOL 500 MG/50ML IV EMUL
INTRAVENOUS | Status: DC | PRN
  Administered 2016-06-16: 150 ug/kg/min via INTRAVENOUS

## 2016-06-16 MED ORDER — GLYCOPYRROLATE 0.2 MG/ML IJ SOLN
INTRAMUSCULAR | Status: DC | PRN
  Administered 2016-06-16: 0.2 mg via INTRAVENOUS

## 2016-06-16 MED ORDER — PROPOFOL 10 MG/ML IV BOLUS
INTRAVENOUS | Status: DC | PRN
  Administered 2016-06-16: 70 mg via INTRAVENOUS

## 2016-06-16 MED ORDER — LIDOCAINE HCL (CARDIAC) 20 MG/ML IV SOLN
INTRAVENOUS | Status: DC | PRN
  Administered 2016-06-16: 80 mg via INTRAVENOUS

## 2016-06-16 MED ORDER — SODIUM CHLORIDE 0.9 % IV SOLN
INTRAVENOUS | Status: DC
  Administered 2016-06-16: 1000 mL via INTRAVENOUS

## 2016-06-16 MED ORDER — GLYCOPYRROLATE 0.2 MG/ML IJ SOLN
INTRAMUSCULAR | Status: AC
  Filled 2016-06-16: qty 1

## 2016-06-16 NOTE — Interval H&P Note (Signed)
History and Physical Interval Note:  06/16/2016 10:01 AM  Chelsey [name]  has presented today for surgery, with the diagnosis of EPIGASTRIC PAIN DYSPEPSIA  The various methods of treatment have been discussed with the patient and family. After consideration of risks, benefits and other options for treatment, the patient has consented to  Procedure(s): ESOPHAGOGASTRODUODENOSCOPY (EGD) WITH PROPOFOL (N/A) as a surgical intervention .  The patient's history has been reviewed, patient examined, no change in status, stable for surgery.  I have reviewed the patient's chart and labs.  Questions were answered to the patient's satisfaction.     SANKAR,SEEPLAPUTHUR G

## 2016-06-16 NOTE — Anesthesia Procedure Notes (Signed)
Performed by: Ambri Miltner Pre-anesthesia Checklist: Patient identified, Emergency Drugs available, Suction available, Patient being monitored and Timeout performed Patient Re-evaluated:Patient Re-evaluated prior to inductionOxygen Delivery Method: Nasal cannula Preoxygenation: Pre-oxygenation with 100% oxygen Intubation Type: IV induction       

## 2016-06-16 NOTE — Anesthesia Post-op Follow-up Note (Cosign Needed)
Anesthesia QCDR form completed.        

## 2016-06-16 NOTE — Op Note (Signed)
[facility] Gastroenterology Patient Name: Chelsey [name] Procedure Date: 06/16/2016 10:00 AM MRN: [id number] Account #: [id number] Date of Birth: 04/11/1950 Admit Type: Outpatient Age: 66 Room: [facility] ENDO ROOM 1 Gender: Female Note Status: Finalized Procedure:            Upper GI endoscopy Indications:          Epigastric abdominal pain, Abdominal pain in the right                        upper quadrant, Dyspepsia Providers:            Kathrina Crosley G. Jamal Collin, MD Referring MD:         Kathrine Haddock (Referring MD) Medicines:            Total IV Anesthesia (TIVA) Complications:        No immediate complications. Procedure:            Pre-Anesthesia Assessment:                       - General anesthesia under the supervision of an                        anesthesiologist was determined to be medically                        necessary for this procedure based on review of the                        patient's medical history, medications, and prior                        anesthesia history.                       After obtaining informed consent, the endoscope was                        passed under direct vision. Throughout the procedure,                        the patient's blood pressure, pulse, and oxygen                        saturations were monitored continuously. The Endoscope                        was introduced through the mouth, and advanced to the                        duodenal bulb. The upper GI endoscopy was accomplished                        without difficulty. The patient tolerated the procedure                        well. Findings:      The esophagus was normal.      The examined duodenum was normal.      A few 2 mm sessile polyps were found in the cardia. The polyp was       removed with a cold biopsy forceps. Resection  and retrieval were       complete.      Segmental mild inflammation characterized by erythema was found in the       prepyloric  region of the stomach. Biopsies were taken with a cold       forceps for histology.      The exam was otherwise without abnormality. Impression:           - Normal esophagus.                       - Normal examined duodenum.                       - A few gastric polyps. Resected and retrieved.                       - Gastritis. Biopsied.                       - The examination was otherwise normal. Recommendation:       - Return to my office. Procedure Code(s):    --- Professional ---                       [phone], Esophagogastroduodenoscopy, flexible, transoral;                        with biopsy, single or multiple Diagnosis Code(s):    --- Professional ---                       K31.7, Polyp of stomach and duodenum                       K29.70, Gastritis, unspecified, without bleeding                       R10.13, Epigastric pain                       R10.11, Right upper quadrant pain CPT copyright 2016 American Medical Association. All rights reserved. The codes documented in this report are preliminary and upon coder review may  be revised to meet current compliance requirements. Christene Lye, MD 06/16/2016 10:34:24 AM This report has been signed electronically. Number of Addenda: 0 Note Initiated On: 06/16/2016 10:00 AM      [facility]

## 2016-06-16 NOTE — H&P (View-Only) (Signed)
Patient ID: Chelsey [name], female   DOB: 04/26/1950, 66 y.o.   MRN: [id number]  Chief Complaint  Patient presents with  . Abdominal Pain    HPI Chelsey [name] is a 66 y.o. female is here today for an evaluation for abdominal pain. Patient states the pain started several months ago. The pain is more noticeable at night. The pain is located more middle portion of stomach. She states she feels bloated after she eats. Moving bowels regularly. She is more nauseous during the day. She describes the pain at times a 10/10. No foods trigger the pain.  She had an Ultrasound and MRI completed on 06/04/16.  HPI  Past Medical History:  Diagnosis Date  . Breast cancer ([place]) 1997   right breast, radiation  . Bronchitis    recent/ 06/09/15 [facility]/resolved  . Cancer [facility]) 1997   right lumpectomy,L/Ad/R   . GERD (gastroesophageal reflux disease)   . Hypercholesteremia   . Hyperlipidemia   . Personal history of malignant neoplasm of breast     Past Surgical History:  Procedure Laterality Date  . BREAST EXCISIONAL BIOPSY Right 1997   pos  . BREAST SURGERY Right 1997   lumpectomy  . COLONOSCOPY  2008  . COLONOSCOPY WITH PROPOFOL N/A 07/21/2015   Procedure: COLONOSCOPY WITH PROPOFOL;  Surgeon: Lucilla Lame, MD;  Location: [place];  Service: Endoscopy;  Laterality: N/A;    Family History  Problem Relation Age of Onset  . Anxiety disorder Brother   . Obesity Brother   . COPD Brother   . Kidney disease Mother   . Heart attack Father   . Hypertension Father   . Alcohol abuse Father   . Depression Brother   . Anxiety disorder Brother   . Breast cancer Maternal Grandmother     Social History Social History  Substance Use Topics  . Smoking status: Former Smoker    Packs/day: 0.25    Years: 5.00    Types: Cigarettes    Start date: 11/25/2009    Quit date: 03/09/2016  . Smokeless tobacco: Never Used     Comment: Has tried patches and may go back on  these again.  Says they work.   . Alcohol use No     Comment: rarely    No Known Allergies  Current Outpatient Prescriptions  Medication Sig Dispense Refill  . alendronate (FOSAMAX) 70 MG tablet Take 1 tablet (70 mg total) by mouth once a week. 12 tablet 3  . aspirin 81 MG chewable tablet Chew 81 mg by mouth daily. am    . Calcium Carbonate-Vitamin D (CALCIUM + D PO) Take 1 tablet by mouth daily. am    . hydrOXYzine (ATARAX/VISTARIL) 25 MG tablet Take 1 tablet (25 mg total) by mouth at bedtime. 90 tablet 2  . mirtazapine (REMERON) 30 MG tablet Take 1 tablet (30 mg total) by mouth at bedtime. 90 tablet 3  . Multiple Vitamin (MULTIVITAMIN) tablet Take 1 tablet by mouth daily. am    . omeprazole (PRILOSEC) 20 MG capsule Take 1 capsule (20 mg total) by mouth daily. 180 capsule 1  . simvastatin (ZOCOR) 20 MG tablet Take 1 tablet (20 mg total) by mouth daily. bedtime 90 tablet 1   No current facility-administered medications for this visit.     Review of Systems Review of Systems  Constitutional: Negative.   Respiratory: Negative.   Cardiovascular: Negative.   Gastrointestinal: Positive for abdominal pain and nausea.  Blood pressure 132/66, pulse 74, resp. rate 12, height 5\' 6"  (1.676 m), weight 176 lb (79.8 kg).  Physical Exam Physical Exam  Constitutional: She is oriented to person, place, and time. She appears well-developed and well-nourished.  HENT:  Mouth/Throat: Oropharynx is clear and moist.  Eyes: Conjunctivae are normal. No scleral icterus.  Neck: Neck supple.  Cardiovascular: Normal rate, regular rhythm and normal heart sounds.   Pulmonary/Chest: Effort normal and breath sounds normal.  Abdominal: Soft. Bowel sounds are normal. There is no tenderness.  Lymphadenopathy:    She has no cervical adenopathy.  Neurological: She is alert and oriented to person, place, and time.  Skin: Skin is warm and dry.  Psychiatric: Her behavior is normal.    Data  Reviewed Progress notes, labs and imaging studies  Assessment    Epigastric pain Dyspepsia    Mild gallbladder wall thickening noted,no stones. Although her pain could be from chronic cholecystitis, need to rule out a gastric source Plan    Discussed upper endoscopy, risk and benefits in detail with the patient. Possible biopsy prn: Information regarding the procedure, including its potential risks and complications was verbally given to the patient. Educational information regarding upper endoscopy was given to the patient. Written instructions for how to complete the prep was provided.      HPI, Physical Exam, Assessment and Plan have been scribed under the direction and in the presence of Mckinley Jewel, MD  Karie Fetch, RN  I have completed the exam and reviewed the above documentation for accuracy and completeness.  I agree with the above.  Haematologist has been used and any errors in dictation or transcription are unintentional.  Seeplaputhur G. Jamal Collin, M.D., F.A.C.S.   Junie Panning G 06/09/2016, 7:30 PM  Patient has been scheduled for an upper endoscopy on 06-16-16 at [facility]. Upper endoscopy instructions have been reviewed with the patient. This patient is aware to call the office if they have further questions.   Dominga Ferry, CMA

## 2016-06-16 NOTE — Anesthesia Preprocedure Evaluation (Signed)
Anesthesia Evaluation  Patient identified by MRN, date of birth, ID band  Reviewed: NPO status   History of Anesthesia Complications Negative for: history of anesthetic complications  Airway Mallampati: III  TM Distance: >3 FB Neck ROM: full    Dental  (+) Chipped, Caps,    Pulmonary neg shortness of breath, neg sleep apnea, neg COPD, neg recent URI, Current Smoker,           Cardiovascular Exercise Tolerance: Good negative cardio ROS  (-) dysrhythmias      Neuro/Psych PSYCHIATRIC DISORDERS negative neurological ROS     GI/Hepatic Neg liver ROS, GERD  Controlled,  Endo/Other  negative endocrine ROS  Renal/GU negative Renal ROS  negative genitourinary   Musculoskeletal   Abdominal   Peds  Hematology R breast cancer;    Anesthesia Other Findings Past Medical History: 1997: Breast cancer ([place])     Comment: right breast, radiation No date: Bronchitis     Comment: recent/ 06/09/15 had [facility] x[facility]/ [facility]               care/resolved 1997: Cancer [facility])     Comment: right lumpectomy,L/Ad/R  No date: GERD (gastroesophageal reflux disease) No date: Hypercholesteremia No date: Hyperlipidemia No date: Hypertension No date: Personal history of malignant neoplasm of brea*   Reproductive/Obstetrics                             Anesthesia Physical  Anesthesia Plan  ASA: II  Anesthesia Plan: General   Post-op Pain Management:    Induction:   Airway Management Planned:   Additional Equipment:   Intra-op Plan:   Post-operative Plan:   Informed Consent: I have reviewed the patients History and Physical, chart, labs and discussed the procedure including the risks, benefits and alternatives for the proposed anesthesia with the patient or authorized representative who has indicated his/her understanding and acceptance.     Plan Discussed with: CRNA  Anesthesia Plan Comments:          Anesthesia Quick Evaluation

## 2016-06-16 NOTE — Transfer of Care (Signed)
Immediate Anesthesia Transfer of Care Note  Patient: Chelsey [name]  Procedure(s) Performed: Procedure(s): ESOPHAGOGASTRODUODENOSCOPY (EGD) WITH PROPOFOL (N/A)  Patient Location: PACU  Anesthesia Type:General  Level of Consciousness: sedated and responds to stimulation  Airway & Oxygen Therapy: Patient Spontanous Breathing and Patient connected to nasal cannula oxygen  Post-op Assessment: Report given to RN and Post -op Vital signs reviewed and stable  Post vital signs: Reviewed and stable  Last Vitals:  Vitals:   06/16/16 1034 06/16/16 1037  BP: 113/83 113/83  Pulse: 86 83  Resp: 12 16  Temp: (!) 36 C     Last Pain:  Vitals:   06/16/16 1034  TempSrc: Tympanic         Complications: No apparent anesthesia complications

## 2016-06-17 ENCOUNTER — Encounter: Payer: Self-pay | Admitting: General Surgery

## 2016-06-17 LAB — SURGICAL PATHOLOGY

## 2016-06-17 NOTE — Anesthesia Postprocedure Evaluation (Signed)
Anesthesia Post Note  Patient: Chelsey [name]  Procedure(s) Performed: Procedure(s) (LRB): ESOPHAGOGASTRODUODENOSCOPY (EGD) WITH PROPOFOL (N/A)  Patient location during evaluation: Endoscopy Anesthesia Type: General Level of consciousness: awake and alert Pain management: pain level controlled Vital Signs Assessment: post-procedure vital signs reviewed and stable Respiratory status: spontaneous breathing, nonlabored ventilation, respiratory function stable and patient connected to nasal cannula oxygen Cardiovascular status: blood pressure returned to baseline and stable Postop Assessment: no signs of nausea or vomiting Anesthetic complications: no     Last Vitals:  Vitals:   06/16/16 1054 06/16/16 1104  BP: 110/77 105/82  Pulse: 78 66  Resp: 16 16  Temp:      Last Pain:  Vitals:   06/16/16 1034  TempSrc: Tympanic                 Martha Clan

## 2016-06-22 ENCOUNTER — Ambulatory Visit (INDEPENDENT_AMBULATORY_CARE_PROVIDER_SITE_OTHER): Payer: Federal, State, Local not specified - PPO | Admitting: General Surgery

## 2016-06-22 ENCOUNTER — Encounter: Payer: Self-pay | Admitting: General Surgery

## 2016-06-22 VITALS — BP 126/72 | HR 76 | Resp 12 | Ht 66.0 in | Wt 175.0 lb

## 2016-06-22 DIAGNOSIS — R1011 Right upper quadrant pain: Secondary | ICD-10-CM

## 2016-06-22 DIAGNOSIS — R1013 Epigastric pain: Secondary | ICD-10-CM | POA: Diagnosis not present

## 2016-06-22 NOTE — Patient Instructions (Addendum)
  Patient to be scheduled for HIDA scan.  The patient is scheduled for a HIDA scan with EF at [facility] on 07/09/16 at 10:00 am. She will arrive by 9:45 am and have nothing to eat or drink after midnight. The patient is aware of date, time, and instructions.

## 2016-06-22 NOTE — Progress Notes (Signed)
Patient ID: Chelsey [name], female   DOB: 03/10/1950, 66 y.o.   MRN: [id number]  Chief Complaint  Patient presents with  . Follow-up    HPI Chelsey [name] is a 66 y.o. female here today for her follow up endoscopy done on 06/16/2016.  HPI  Past Medical History:  Diagnosis Date  . Breast cancer ([place]) 1997   right breast, radiation  . Bronchitis    recent/ 06/09/15 had [facility]/ [facility]/resolved  . Cancer [facility]) 1997   right lumpectomy,L/Ad/R   . GERD (gastroesophageal reflux disease)   . Hypercholesteremia   . Hyperlipidemia   . Hypertension   . Personal history of malignant neoplasm of breast     Past Surgical History:  Procedure Laterality Date  . BREAST EXCISIONAL BIOPSY Right 1997   pos  . BREAST SURGERY Right 1997   lumpectomy  . COLONOSCOPY  2008  . COLONOSCOPY WITH PROPOFOL N/A 07/21/2015   Procedure: COLONOSCOPY WITH PROPOFOL;  Surgeon: Lucilla Lame, MD;  Location: [place];  Service: Endoscopy;  Laterality: N/A;  . ESOPHAGOGASTRODUODENOSCOPY (EGD) WITH PROPOFOL N/A 06/16/2016   Procedure: ESOPHAGOGASTRODUODENOSCOPY (EGD) WITH PROPOFOL;  Surgeon: Christene Lye, MD;  Location: [facility] ENDOSCOPY;  Service: Endoscopy;  Laterality: N/A;    Family History  Problem Relation Age of Onset  . Anxiety disorder Brother   . Obesity Brother   . COPD Brother   . Kidney disease Mother   . Heart attack Father   . Hypertension Father   . Alcohol abuse Father   . Depression Brother   . Anxiety disorder Brother   . Breast cancer Maternal Grandmother     Social History Social History  Substance Use Topics  . Smoking status: Light Tobacco Smoker    Packs/day: 0.25    Years: 5.00    Types: Cigarettes    Start date: 11/25/2009    Last attempt to quit: 03/09/2016  . Smokeless tobacco: Never Used     Comment: Has tried patches and may go back on these again.  Says they work.   . Alcohol use No     Comment: rarely    No Known  Allergies  Current Outpatient Prescriptions  Medication Sig Dispense Refill  . alendronate (FOSAMAX) 70 MG tablet Take 1 tablet (70 mg total) by mouth once a week. 12 tablet 3  . aspirin 81 MG chewable tablet Chew 81 mg by mouth daily. am    . Calcium Carbonate-Vitamin D (CALCIUM + D PO) Take 1 tablet by mouth daily. am    . hydrOXYzine (ATARAX/VISTARIL) 25 MG tablet Take 1 tablet (25 mg total) by mouth at bedtime. 90 tablet 2  . mirtazapine (REMERON) 30 MG tablet Take 1 tablet (30 mg total) by mouth at bedtime. 90 tablet 3  . Multiple Vitamin (MULTIVITAMIN) tablet Take 1 tablet by mouth daily. am    . omeprazole (PRILOSEC) 20 MG capsule Take 1 capsule (20 mg total) by mouth daily. 180 capsule 1  . simvastatin (ZOCOR) 20 MG tablet Take 1 tablet (20 mg total) by mouth daily. bedtime 90 tablet 1   No current facility-administered medications for this visit.     Review of Systems Review of Systems  Constitutional: Negative.   Respiratory: Negative.   Cardiovascular: Negative.     Blood pressure 126/72, pulse 76, resp. rate 12, height 5\' 6"  (1.676 m), weight 175 lb (79.4 kg).  Physical Exam Physical Exam  Constitutional: She is oriented to person, place, and time. She  appears well-developed and well-nourished.  Neurological: She is alert and oriented to person, place, and time.  Skin: Skin is warm and dry.    Data Reviewed Upper endoscopy revealed mild gastritis-no H pylori  Assessment    Epigastric pain, post prandial fullness and flatulence. Upper endoscopy findings were minimal and felt to be responsible for her symptoms.  GB US showed thickening of wall but no stones.     Plan    Patient to be scheduled for HIDA scan. If abnormal will consider cholecystectomy HPI, Physical Exam, Assessment and Plan have been scribed under the direction and in the presence of Mckinley Jewel, MD  Chelsey [name], CMA   I have completed the exam and reviewed the above documentation for  accuracy and completeness.  I agree with the above.  Haematologist has been used and any errors in dictation or transcription are unintentional.  Gevon Markus G. Jamal Collin, M.D., F.A.C.S.  The patient is scheduled for a HIDA scan with EF at [facility] on 07/09/16 at 10:00 am. She will arrive by 9:45 am and have nothing to eat or drink after midnight. The patient is aware of date, time, and instructions.  Documented by Lesly Rubenstein LPN    Chelsey [name] 06/22/2016, 1:38 PM

## 2016-07-05 ENCOUNTER — Encounter: Payer: Self-pay | Admitting: Unknown Physician Specialty

## 2016-07-05 ENCOUNTER — Ambulatory Visit (INDEPENDENT_AMBULATORY_CARE_PROVIDER_SITE_OTHER): Payer: Federal, State, Local not specified - PPO | Admitting: Unknown Physician Specialty

## 2016-07-05 VITALS — BP 120/81 | HR 71 | Temp 98.3°F | Ht 64.3 in | Wt 178.3 lb

## 2016-07-05 DIAGNOSIS — M8580 Other specified disorders of bone density and structure, unspecified site: Secondary | ICD-10-CM

## 2016-07-05 DIAGNOSIS — K219 Gastro-esophageal reflux disease without esophagitis: Secondary | ICD-10-CM | POA: Diagnosis not present

## 2016-07-05 DIAGNOSIS — Z23 Encounter for immunization: Secondary | ICD-10-CM

## 2016-07-05 DIAGNOSIS — Z0000 Encounter for general adult medical examination without abnormal findings: Secondary | ICD-10-CM | POA: Diagnosis not present

## 2016-07-05 DIAGNOSIS — R109 Unspecified abdominal pain: Secondary | ICD-10-CM | POA: Diagnosis not present

## 2016-07-05 MED ORDER — SIMVASTATIN 20 MG PO TABS: 20.0000 mg | ORAL_TABLET | Freq: Every day | ORAL | 1 refills | Status: DC

## 2016-07-05 MED ORDER — ALENDRONATE SODIUM 70 MG PO TABS
70.0000 mg | ORAL_TABLET | ORAL | 3 refills | Status: DC
Start: 2016-07-05 — End: 2017-06-07

## 2016-07-05 MED ORDER — OMEPRAZOLE 20 MG PO CPDR: 20.0000 mg | DELAYED_RELEASE_CAPSULE | Freq: Two times a day (BID) | ORAL | 3 refills | Status: DC

## 2016-07-05 NOTE — Assessment & Plan Note (Signed)
W/u with Dr. Leanor Kail

## 2016-07-05 NOTE — Patient Instructions (Addendum)
Pneumococcal Conjugate Vaccine (PCV13) What You Need to Know 1. Why get vaccinated? Vaccination can protect both children and adults from pneumococcal disease. Pneumococcal disease is caused by bacteria that can spread from person to person through close contact. It can cause ear infections, and it can also lead to more serious infections of the:  Lungs (pneumonia),  Blood (bacteremia), and  Covering of the brain and spinal cord (meningitis).  Pneumococcal pneumonia is most common among adults. Pneumococcal meningitis can cause deafness and brain damage, and it kills about 1 child in 10 who get it. Anyone can get pneumococcal disease, but children under 2 years of age and adults 65 years and older, people with certain medical conditions, and cigarette smokers are at the highest risk. Before there was a vaccine, the [place] saw:  more than 700 cases of meningitis,  about 13,000 blood infections,  about 5 million ear infections, and  about 200 deaths  in children under 5 each year from pneumococcal disease. Since vaccine became available, severe pneumococcal disease in these children has fallen by 88%. About 18,000 older adults die of pneumococcal disease each year in the [place]. Treatment of pneumococcal infections with penicillin and other drugs is not as effective as it used to be, because some strains of the disease have become resistant to these drugs. This makes prevention of the disease, through vaccination, even more important. 2. PCV13 vaccine Pneumococcal conjugate vaccine (called PCV13) protects against 13 types of pneumococcal bacteria. PCV13 is routinely given to children at 2, 4, 6, and 12-15 months of age. It is also recommended for children and adults 2 to 64 years of age with certain health conditions, and for all adults 65 years of age and older. Your doctor can give you details. 3. Some people should not get this vaccine Anyone who has ever had a  life-threatening allergic reaction to a dose of this vaccine, to an earlier pneumococcal vaccine called PCV7, or to any vaccine containing diphtheria toxoid (for example, DTaP), should not get PCV13. Anyone with a severe allergy to any component of PCV13 should not get the vaccine. Tell your doctor if the person being vaccinated has any severe allergies. If the person scheduled for vaccination is not feeling well, your healthcare provider might decide to reschedule the shot on another day. 4. Risks of a vaccine reaction With any medicine, including vaccines, there is a chance of reactions. These are usually mild and go away on their own, but serious reactions are also possible. Problems reported following PCV13 varied by age and dose in the series. The most common problems reported among children were:  About half became drowsy after the shot, had a temporary loss of appetite, or had redness or tenderness where the shot was given.  About 1 out of 3 had swelling where the shot was given.  About 1 out of 3 had a mild fever, and about 1 in 20 had a fever over 102.2F.  Up to about 8 out of 10 became fussy or irritable.  Adults have reported pain, redness, and swelling where the shot was given; also mild fever, fatigue, headache, chills, or muscle pain. Young children who get PCV13 along with inactivated flu vaccine at the same time may be at increased risk for seizures caused by fever. Ask your doctor for more information. Problems that could happen after any vaccine:  People sometimes faint after a medical procedure, including vaccination. Sitting or lying down for about 15 minutes can help prevent   fainting, and injuries caused by a fall. Tell your doctor if you feel dizzy, or have vision changes or ringing in the ears.  Some older children and adults get severe pain in the shoulder and have difficulty moving the arm where a shot was given. This happens very rarely.  Any medication can cause a  severe allergic reaction. Such reactions from a vaccine are very rare, estimated at about 1 in a million doses, and would happen within a few minutes to a few hours after the vaccination. As with any medicine, there is a very small chance of a vaccine causing a serious injury or death. The safety of vaccines is always being monitored. For more information, visit: http://www.aguilar.org/ 5. What if there is a serious reaction? What should I look for? Look for anything that concerns you, such as signs of a severe allergic reaction, very high fever, or unusual behavior. Signs of a severe allergic reaction can include hives, swelling of the face and throat, difficulty breathing, a fast heartbeat, dizziness, and weakness-usually within a few minutes to a few hours after the vaccination. What should I do?  If you think it is a severe allergic reaction or other emergency that can't wait, call 9-1-1 or get the person to the nearest hospital. Otherwise, call your doctor.  Reactions should be reported to the Vaccine Adverse Event Reporting System (VAERS). Your doctor should file this report, or you can do it yourself through the VAERS web site at www.vaers.SamedayNews.es, or by calling 930-524-7509. ? VAERS does not give medical advice. 6. The National Vaccine Injury Compensation Program The [employer] Vaccine Injury Compensation Program (VICP) is a federal program that was created to compensate people who may have been injured by certain vaccines. Persons who believe they may have been injured by a vaccine can learn about the program and about filing a claim by calling (709)295-8101 or visiting the [place] website at GoldCloset.com.ee. There is a time limit to file a claim for compensation. 7. How can I learn more?  Ask your healthcare provider. He or she can give you the vaccine package insert or suggest other sources of information.  Call your local or state health department.  Contact the  Centers for Disease Control and Prevention (CDC): ? Call (660)713-4347 (1-800-CDC-INFO) or ? Visit CDC's website at http://hunter.com/ Vaccine Information Statement, PCV13 Vaccine (11/29/2013) This information is not intended to replace advice given to you by your health care provider. Make sure you discuss any questions you have with your health care provider. --------------------------------------------------------------------- Please do call to schedule your bone density; the number to schedule one at either [facility] or [facility] is [phone]

## 2016-07-05 NOTE — Assessment & Plan Note (Signed)
Continue with BID Omeprazole until cause of gastric symptoms are determined

## 2016-07-05 NOTE — Progress Notes (Signed)
BP 120/81   Pulse 71   Temp 98.3 F (36.8 C)   Ht 5' 4.3" (1.633 m)   Wt 178 lb 4.8 oz (80.9 kg)   LMP  (LMP Unknown)   SpO2 98%   BMI 30.32 kg/m    Subjective:    Patient ID: Chelsey [name], female    DOB: Dec 09, 1950, 66 y.o.   MRN: [id number]  HPI: Chelsey [name] is a 66 y.o. female  Chief Complaint  Patient presents with  . Annual Exam   Hyperlipidemia Using medications without problems: No Muscle aches  Diet compliance: eating small amounts due to gastric symptoms.  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  Having a w/u with Dr. Jamal Collin.    Depression Seeing psychiatry and doing well   Relevant past medical, surgical, family and social history reviewed and updated as indicated. Interim medical history since our last visit reviewed. Allergies and medications reviewed and updated.  Review of Systems  Per HPI unless specifically indicated above     Objective:    BP 120/81   Pulse 71   Temp 98.3 F (36.8 C)   Ht 5' 4.3" (1.633 m)   Wt 178 lb 4.8 oz (80.9 kg)   LMP  (LMP Unknown)   SpO2 98%   BMI 30.32 kg/m   Wt Readings from Last 3 Encounters:  07/05/16 178 lb 4.8 oz (80.9 kg)  06/22/16 175 lb (79.4 kg)  06/09/16 176 lb (79.8 kg)    Physical Exam  Constitutional: She is oriented to person, place, and time. She appears well-developed and well-nourished. No distress.  HENT:  Head: Normocephalic and atraumatic.  Eyes: Conjunctivae and lids are normal. Right eye exhibits no discharge. Left eye exhibits no discharge. No scleral icterus.  Neck: Normal range of motion. Neck supple. No JVD present. Carotid bruit is not present.  Cardiovascular: Normal rate, regular rhythm and normal heart sounds.   Pulmonary/Chest: Effort normal and breath sounds normal.  Abdominal: Normal appearance. There is no splenomegaly or hepatomegaly.  Musculoskeletal: Normal range of motion.  Neurological: She is  alert and oriented to person, place, and time.  Skin: Skin is warm, dry and intact. No rash noted. No pallor.  Psychiatric: She has a normal mood and affect. Her behavior is normal. Judgment and thought content normal.      Assessment & Plan:   Problem List Items Addressed This Visit      Unprioritized   Abdominal pain    W/u with Dr. Jamal Collin ongoin      Relevant Medications   omeprazole (PRILOSEC) 20 MG capsule   Gastroesophageal reflux disease without esophagitis    Continue with BID Omeprazole until cause of gastric symptoms are determined      Relevant Medications   omeprazole (PRILOSEC) 20 MG capsule    Other Visit Diagnoses    Need for pneumococcal vaccination    -  Primary   Relevant Orders   Pneumococcal conjugate vaccine 13-valent IM (Completed)   Annual physical exam       Relevant Orders   Lipid Panel w/o Chol/HDL Ratio   Comprehensive metabolic panel   CBC with Differential/Platelet   TSH   VITAMIN D 25 Hydroxy (Vit-D Deficiency, Fractures)   Osteopenia, unspecified location       Relevant Orders   DG Bone Density       Follow up plan: Return in about 6 months (around 01/04/2017).

## 2016-07-06 ENCOUNTER — Encounter: Payer: Self-pay | Admitting: Unknown Physician Specialty

## 2016-07-06 LAB — CBC WITH DIFFERENTIAL/PLATELET
BASOS ABS: 0 10*3/uL (ref 0.0–0.2)
BASOS: 1 %
EOS (ABSOLUTE): 0.1 10*3/uL (ref 0.0–0.4)
Eos: 2 %
HEMOGLOBIN: 13.5 g/dL (ref 11.1–15.9)
Hematocrit: 40.6 % (ref 34.0–46.6)
IMMATURE GRANS (ABS): 0 10*3/uL (ref 0.0–0.1)
IMMATURE GRANULOCYTES: 0 %
LYMPHS: 27 %
Lymphocytes Absolute: 1.3 10*3/uL (ref 0.7–3.1)
MCH: 30.4 pg (ref 26.6–33.0)
MCHC: 33.3 g/dL (ref 31.5–35.7)
MCV: 91 fL (ref 79–97)
MONOCYTES: 8 %
Monocytes Absolute: 0.4 10*3/uL (ref 0.1–0.9)
NEUTROS ABS: 3.1 10*3/uL (ref 1.4–7.0)
Neutrophils: 62 %
Platelets: 246 10*3/uL (ref 150–379)
RBC: 4.44 x10E6/uL (ref 3.77–5.28)
RDW: 14.2 % (ref 12.3–15.4)
WBC: 5 10*3/uL (ref 3.4–10.8)

## 2016-07-06 LAB — TSH: TSH: 2.45 u[IU]/mL (ref 0.450–4.500)

## 2016-07-06 LAB — COMPREHENSIVE METABOLIC PANEL
ALT: 14 IU/L (ref 0–32)
AST: 17 IU/L (ref 0–40)
Albumin/Globulin Ratio: 1.7 (ref 1.2–2.2)
Albumin: 4 g/dL (ref 3.6–4.8)
Alkaline Phosphatase: 81 IU/L (ref 39–117)
BUN/Creatinine Ratio: 15 (ref 12–28)
BUN: 10 mg/dL (ref 8–27)
Bilirubin Total: 0.6 mg/dL (ref 0.0–1.2)
CALCIUM: 9 mg/dL (ref 8.7–10.3)
CHLORIDE: 104 mmol/L (ref 96–106)
CO2: 26 mmol/L (ref 20–29)
Creatinine, Ser: 0.65 mg/dL (ref 0.57–1.00)
GFR, EST AFRICAN AMERICAN: 108 mL/min/{1.73_m2} (ref >59)
GFR, EST NON AFRICAN AMERICAN: 93 mL/min/{1.73_m2} (ref >59)
GLUCOSE: 95 mg/dL (ref 65–99)
Globulin, Total: 2.3 g/dL (ref 1.5–4.5)
Potassium: 4.4 mmol/L (ref 3.5–5.2)
Sodium: 140 mmol/L (ref 134–144)
TOTAL PROTEIN: 6.3 g/dL (ref 6.0–8.5)

## 2016-07-06 LAB — LIPID PANEL W/O CHOL/HDL RATIO
Cholesterol, Total: 165 mg/dL (ref 100–199)
HDL: 58 mg/dL (ref >39)
LDL CALC: 89 mg/dL (ref 0–99)
Triglycerides: 88 mg/dL (ref 0–149)
VLDL Cholesterol Cal: 18 mg/dL (ref 5–40)

## 2016-07-06 LAB — VITAMIN D 25 HYDROXY (VIT D DEFICIENCY, FRACTURES): Vit D, 25-Hydroxy: 39.9 ng/mL (ref 30.0–100.0)

## 2016-07-08 ENCOUNTER — Other Ambulatory Visit: Payer: Federal, State, Local not specified - PPO

## 2016-07-09 ENCOUNTER — Ambulatory Visit
Admission: RE | Admit: 2016-07-09 | Discharge: 2016-07-09 | Disposition: A | Discharge status: Home / Self Care | Payer: Federal, State, Local not specified - PPO | Source: Ambulatory Visit | Attending: General Surgery | Admitting: General Surgery

## 2016-07-09 DIAGNOSIS — R1013 Epigastric pain: Secondary | ICD-10-CM

## 2016-07-09 DIAGNOSIS — R1011 Right upper quadrant pain: Secondary | ICD-10-CM | POA: Diagnosis present

## 2016-07-09 MED ORDER — TECHNETIUM TC 99M MEBROFENIN IV KIT
5.0000 | PACK | Freq: Once | INTRAVENOUS | Status: AC | PRN
  Administered 2016-07-09: 5.1 via INTRAVENOUS

## 2016-07-12 ENCOUNTER — Telehealth: Payer: Self-pay | Admitting: *Deleted

## 2016-07-12 NOTE — Telephone Encounter (Signed)
Patient notified.   She wishes to call the office back to schedule an appointment. Patient needs to see if she can get off work.

## 2016-07-12 NOTE — Telephone Encounter (Signed)
Patient called back and appointment scheduled for 07-14-16 at 3:45 pm with Dr. Jamal Collin.

## 2016-07-12 NOTE — Telephone Encounter (Signed)
-----   Message from Christene Lye, MD sent at 07/12/2016  8:47 AM EDT ----- Report reviewed. Need to see pt

## 2016-07-13 ENCOUNTER — Ambulatory Visit
Admission: RE | Admit: 2016-07-13 | Discharge: 2016-07-13 | Disposition: A | Discharge status: Home / Self Care | Payer: Federal, State, Local not specified - PPO | Source: Ambulatory Visit | Attending: Unknown Physician Specialty | Admitting: Unknown Physician Specialty

## 2016-07-13 ENCOUNTER — Encounter: Payer: Self-pay | Admitting: Unknown Physician Specialty

## 2016-07-13 DIAGNOSIS — M8580 Other specified disorders of bone density and structure, unspecified site: Secondary | ICD-10-CM | POA: Diagnosis present

## 2016-07-13 DIAGNOSIS — Z13820 Encounter for screening for osteoporosis: Secondary | ICD-10-CM | POA: Diagnosis not present

## 2016-07-14 ENCOUNTER — Encounter: Payer: Self-pay | Admitting: *Deleted

## 2016-07-14 ENCOUNTER — Ambulatory Visit (INDEPENDENT_AMBULATORY_CARE_PROVIDER_SITE_OTHER): Payer: Federal, State, Local not specified - PPO | Admitting: General Surgery

## 2016-07-14 ENCOUNTER — Encounter: Payer: Self-pay | Admitting: General Surgery

## 2016-07-14 VITALS — BP 122/76 | HR 83 | Resp 12 | Ht 65.0 in | Wt 177.0 lb

## 2016-07-14 DIAGNOSIS — K811 Chronic cholecystitis: Secondary | ICD-10-CM | POA: Diagnosis not present

## 2016-07-14 DIAGNOSIS — R1013 Epigastric pain: Secondary | ICD-10-CM

## 2016-07-14 NOTE — Progress Notes (Signed)
Patient ID: Chelsey [name], female   DOB: 02-13-1950, 66 y.o.   MRN: [id number]  Chief Complaint  Patient presents with  . Follow-up    CT scan    HPI Chelsey [name] is a 66 y.o. female.  Here today to discuss HIDA scan results. HIDA scan done 07-09-16. She denied having any pain or discomfort during the scan. She still feels fulness from upper abdomen into her chest shortly after eating.     HPI  Past Medical History:  Diagnosis Date  . Breast cancer ([place]) 1997   right breast, radiation  . Bronchitis    recent/ 06/09/15 [facility]/resolved  . Cancer [facility]) 1997   right lumpectomy,L/Ad/R   . GERD (gastroesophageal reflux disease)   . Hypercholesteremia   . Hyperlipidemia   . Hypertension   . Personal history of malignant neoplasm of breast     Past Surgical History:  Procedure Laterality Date  . BREAST EXCISIONAL BIOPSY Right 1997   pos  . BREAST SURGERY Right 1997   lumpectomy  . COLONOSCOPY  2008  . COLONOSCOPY WITH PROPOFOL N/A 07/21/2015   Procedure: COLONOSCOPY WITH PROPOFOL;  Surgeon: Lucilla Lame, MD;  Location: [place];  Service: Endoscopy;  Laterality: N/A;  . ESOPHAGOGASTRODUODENOSCOPY (EGD) WITH PROPOFOL N/A 06/16/2016   Procedure: ESOPHAGOGASTRODUODENOSCOPY (EGD) WITH PROPOFOL;  Surgeon: Christene Lye, MD;  Location: [facility];  Service: Endoscopy;  Laterality: N/A;    Family History  Problem Relation Age of Onset  . Anxiety disorder Brother   . Obesity Brother   . COPD Brother   . Kidney disease Mother   . Heart attack Father   . Hypertension Father   . Alcohol abuse Father   . Depression Brother   . Anxiety disorder Brother   . Breast cancer Maternal Grandmother     Social History Social History  Substance Use Topics  . Smoking status: Light Tobacco Smoker    Packs/day: 0.25    Years: 5.00    Types: Cigarettes    Start date: 11/25/2009    Last attempt to quit: 03/09/2016  . Smokeless tobacco:  Never Used     Comment: Has tried patches and may go back on these again.  Says they work.   . Alcohol use No     Comment: rarely    No Known Allergies  Current Outpatient Prescriptions  Medication Sig Dispense Refill  . alendronate (FOSAMAX) 70 MG tablet Take 1 tablet (70 mg total) by mouth once a week. 12 tablet 3  . aspirin 81 MG chewable tablet Chew 81 mg by mouth daily. am    . Calcium Carbonate-Vitamin D (CALCIUM + D PO) Take 1 tablet by mouth daily. am    . hydrOXYzine (ATARAX/VISTARIL) 25 MG tablet Take 1 tablet (25 mg total) by mouth at bedtime. 90 tablet 2  . mirtazapine (REMERON) 30 MG tablet Take 1 tablet (30 mg total) by mouth at bedtime. 90 tablet 3  . Multiple Vitamin (MULTIVITAMIN) tablet Take 1 tablet by mouth daily. am    . omeprazole (PRILOSEC) 20 MG capsule Take 1 capsule (20 mg total) by mouth 2 (two) times daily before a meal. 90 capsule 3  . simvastatin (ZOCOR) 20 MG tablet Take 1 tablet (20 mg total) by mouth daily. bedtime 90 tablet 1   No current facility-administered medications for this visit.     Review of Systems Review of Systems  Constitutional: Negative.   Respiratory: Negative.   Cardiovascular:  Negative.     Blood pressure 122/76, pulse 83, resp. rate 12, height 5\' 5"  (1.651 m), weight 177 lb (80.3 kg).  Physical Exam Physical Exam  Constitutional: She is oriented to person, place, and time. She appears well-developed and well-nourished.  Eyes: Conjunctivae are normal. No scleral icterus.  Neck: Neck supple.  Cardiovascular: Normal rate, regular rhythm and normal heart sounds.   Pulmonary/Chest: Effort normal and breath sounds normal.  Abdominal: Soft. Bowel sounds are normal. There is no hepatomegaly.  Lymphadenopathy:    She has no cervical adenopathy.  Neurological: She is alert and oriented to person, place, and time.  Skin: Skin is warm and dry.  Psychiatric: She has a normal mood and affect. Her behavior is normal.    Data  Reviewed Prior notes and HIDA scan reviewed. HIDA scan results shows EF of 8%-extremely low. [place] had shown gallbaldder wall thickening Assessment    Epigastric pain, post prandial fullness- pt is following up, HIDA scan shows very low ejection fraction. This coupled with gallbaldder wall thickening suggests an abnormal gallbladder and is likely the cause of her abdominal symptom. Upper endoscopy was essentially normal.    Plan   Reasonable to consider cholecystectomy. Reasons, risks and benefits explained  And pt is agreeable. Laparoscopic Cholecystectomy with Intraoperative Cholangiogram. The procedure, including it's potential risks and complications (including but not limited to infection, bleeding, injury to intra-abdominal organs or bile ducts, bile leak, poor cosmetic result, sepsis and death) were discussed with the patient in detail. Non-operative options, including their inherent risks (acute calculous cholecystitis with possible choledocholithiasis or gallstone pancreatitis, with the risk of ascending cholangitis, sepsis, and death) were discussed as well. The patient expressed and understanding of what we discussed and wishes to proceed with laparoscopic cholecystectomy. The patient further understands that if it is technically not possible, or it is unsafe to proceed laparoscopically, that I will convert to an open cholecystectomy.     HPI, Physical Exam, Assessment and Plan have been scribed under the direction and in the presence of Mckinley Jewel, MD  Karie Fetch, RN I have completed the exam and reviewed the above documentation for accuracy and completeness.  I agree with the above.  Haematologist has been used and any errors in dictation or transcription are unintentional.  Yuvin Bussiere G. Jamal Collin, M.D., F.A.C.S.   Junie Panning G 07/14/2016, 4:55 PM  Patient's surgery has been scheduled for 08-06-16 at [facility]. It is okay for patient to continue an 81 mg aspirin once  daily.   Dominga Ferry, CMA

## 2016-07-14 NOTE — Patient Instructions (Signed)

## 2016-07-19 ENCOUNTER — Other Ambulatory Visit: Payer: Self-pay | Admitting: General Surgery

## 2016-07-19 DIAGNOSIS — K811 Chronic cholecystitis: Secondary | ICD-10-CM

## 2016-07-27 ENCOUNTER — Encounter
Admission: RE | Admit: 2016-07-27 | Discharge: 2016-07-27 | Disposition: A | Discharge status: Home / Self Care | Payer: Federal, State, Local not specified - PPO | Source: Ambulatory Visit | Attending: General Surgery | Admitting: General Surgery

## 2016-07-27 ENCOUNTER — Encounter: Payer: Self-pay | Admitting: *Deleted

## 2016-07-27 NOTE — Patient Instructions (Signed)
  Your procedure is scheduled on: 08/06/16 Report to Day Surgery. [facility]OND FLOOR To find out your arrival time please call [phone] between 1PM - 3PM on 08/05/16  Remember: Instructions that are not followed completely may result in serious medical risk, up to and including death, or upon the discretion of your surgeon and anesthesiologist your surgery may need to be rescheduled.    __X__ 1. Do not eat food or drink liquids after midnight. No gum chewing or hard candies.     ____ 2. No Alcohol for 24 hours before or after surgery.   __X__ 3. Do Not Smoke For 24 Hours Prior to Your Surgery.   ____ 4. Bring all medications with you on the day of surgery if instructed.    __X__ 5. Notify your doctor if there is any change in your medical condition     (cold, fever, infections).       Do not wear jewelry, make-up, hairpins, clips or nail polish.  Do not wear lotions, powders, or perfumes. You may wear deodorant.  Do not shave 48 hours prior to surgery. Men may shave face and neck.  Do not bring valuables to the hospital.    [facility] is not responsible for any belongings or valuables.               Contacts, dentures or bridgework may not be worn into surgery.  Leave your suitcase in the car. After surgery it may be brought to your room.  For patients admitted to the hospital, discharge time is determined by your                treatment team.   Patients discharged the day of surgery will not be allowed to drive home.     __X__ Take these medicines the morning of surgery with A SIP OF WATER:    1. OMEPRAZOLE  2.   3.   4.  5.  6.  ____ Fleet Enema (as directed)   ____ Use CHG Soap as directed  ____ Use inhalers on the day of surgery  ____ Stop metformin 2 days prior to surgery    ____ Take 1/2 of usual insulin dose the night before surgery and none on the morning of surgery.   ____ Stop Coumadin/Plavix/aspirin on ____ Stop Anti-inflammatories on     ____ Stop supplements until after surgery.    ____ Bring C-Pap to the hospital.   DO NOT WEAR NICOTINE PATCH AM OF SURGERY

## 2016-08-05 MED ORDER — CEFAZOLIN SODIUM-DEXTROSE 2-4 GM/100ML-% IV SOLN
2.0000 g | INTRAVENOUS | Status: AC
  Administered 2016-08-06: 2 g via INTRAVENOUS

## 2016-08-06 ENCOUNTER — Ambulatory Visit: Payer: Federal, State, Local not specified - PPO | Admitting: Anesthesiology

## 2016-08-06 ENCOUNTER — Encounter
Admission: RE | Disposition: A | Discharge status: Home / Self Care | Payer: Self-pay | Source: Ambulatory Visit | Attending: General Surgery

## 2016-08-06 ENCOUNTER — Ambulatory Visit: Payer: Federal, State, Local not specified - PPO

## 2016-08-06 ENCOUNTER — Ambulatory Visit
Admission: RE | Admit: 2016-08-06 | Discharge: 2016-08-06 | Disposition: A | Discharge status: Home / Self Care | Payer: Federal, State, Local not specified - PPO | Source: Ambulatory Visit | Attending: General Surgery | Admitting: General Surgery

## 2016-08-06 ENCOUNTER — Encounter: Payer: Self-pay | Admitting: *Deleted

## 2016-08-06 DIAGNOSIS — Z419 Encounter for procedure for purposes other than remedying health state, unspecified: Secondary | ICD-10-CM

## 2016-08-06 DIAGNOSIS — I10 Essential (primary) hypertension: Secondary | ICD-10-CM | POA: Diagnosis not present

## 2016-08-06 DIAGNOSIS — E782 Mixed hyperlipidemia: Secondary | ICD-10-CM | POA: Diagnosis not present

## 2016-08-06 DIAGNOSIS — Z803 Family history of malignant neoplasm of breast: Secondary | ICD-10-CM | POA: Insufficient documentation

## 2016-08-06 DIAGNOSIS — Z811 Family history of alcohol abuse and dependence: Secondary | ICD-10-CM | POA: Insufficient documentation

## 2016-08-06 DIAGNOSIS — K811 Chronic cholecystitis: Secondary | ICD-10-CM

## 2016-08-06 DIAGNOSIS — F17210 Nicotine dependence, cigarettes, uncomplicated: Secondary | ICD-10-CM | POA: Diagnosis not present

## 2016-08-06 DIAGNOSIS — Z818 Family history of other mental and behavioral disorders: Secondary | ICD-10-CM | POA: Diagnosis not present

## 2016-08-06 DIAGNOSIS — Z825 Family history of asthma and other chronic lower respiratory diseases: Secondary | ICD-10-CM | POA: Diagnosis not present

## 2016-08-06 DIAGNOSIS — Z79899 Other long term (current) drug therapy: Secondary | ICD-10-CM | POA: Insufficient documentation

## 2016-08-06 DIAGNOSIS — K219 Gastro-esophageal reflux disease without esophagitis: Secondary | ICD-10-CM | POA: Insufficient documentation

## 2016-08-06 DIAGNOSIS — Z7982 Long term (current) use of aspirin: Secondary | ICD-10-CM | POA: Insufficient documentation

## 2016-08-06 DIAGNOSIS — Z853 Personal history of malignant neoplasm of breast: Secondary | ICD-10-CM | POA: Diagnosis not present

## 2016-08-06 HISTORY — PX: CHOLECYSTECTOMY: SHX55

## 2016-08-06 SURGERY — LAPAROSCOPIC CHOLECYSTECTOMY WITH INTRAOPERATIVE CHOLANGIOGRAM
Anesthesia: General | Wound class: Clean Contaminated

## 2016-08-06 MED ORDER — SODIUM CHLORIDE 0.9 % IJ SOLN
INTRAMUSCULAR | Status: AC
  Filled 2016-08-06: qty 50

## 2016-08-06 MED ORDER — PHENYLEPHRINE HCL 10 MG/ML IJ SOLN
INTRAMUSCULAR | Status: DC | PRN
  Administered 2016-08-06: 100 ug via INTRAVENOUS

## 2016-08-06 MED ORDER — DEXAMETHASONE SODIUM PHOSPHATE 10 MG/ML IJ SOLN
INTRAMUSCULAR | Status: DC | PRN
  Administered 2016-08-06: 10 mg via INTRAVENOUS

## 2016-08-06 MED ORDER — ONDANSETRON HCL 4 MG/2ML IJ SOLN
INTRAMUSCULAR | Status: DC | PRN
  Administered 2016-08-06: 4 mg via INTRAVENOUS

## 2016-08-06 MED ORDER — FENTANYL CITRATE (PF) 100 MCG/2ML IJ SOLN
INTRAMUSCULAR | Status: DC | PRN
  Administered 2016-08-06: 100 ug via INTRAVENOUS
  Administered 2016-08-06: 50 ug via INTRAVENOUS

## 2016-08-06 MED ORDER — KETOROLAC TROMETHAMINE 30 MG/ML IJ SOLN
INTRAMUSCULAR | Status: DC | PRN
  Administered 2016-08-06: 30 mg via INTRAVENOUS

## 2016-08-06 MED ORDER — ONDANSETRON HCL 4 MG/2ML IJ SOLN
INTRAMUSCULAR | Status: AC
  Filled 2016-08-06: qty 2

## 2016-08-06 MED ORDER — SUGAMMADEX SODIUM 200 MG/2ML IV SOLN
INTRAVENOUS | Status: AC
  Filled 2016-08-06: qty 2

## 2016-08-06 MED ORDER — ROCURONIUM BROMIDE 100 MG/10ML IV SOLN
INTRAVENOUS | Status: DC | PRN
  Administered 2016-08-06: 30 mg via INTRAVENOUS
  Administered 2016-08-06: 20 mg via INTRAVENOUS

## 2016-08-06 MED ORDER — FENTANYL CITRATE (PF) 100 MCG/2ML IJ SOLN: 25.0000 ug | INTRAMUSCULAR | Status: DC | PRN

## 2016-08-06 MED ORDER — ACETAMINOPHEN 10 MG/ML IV SOLN
INTRAVENOUS | Status: AC
  Filled 2016-08-06: qty 100

## 2016-08-06 MED ORDER — DEXAMETHASONE SODIUM PHOSPHATE 10 MG/ML IJ SOLN
INTRAMUSCULAR | Status: AC
  Filled 2016-08-06: qty 1

## 2016-08-06 MED ORDER — CEFAZOLIN SODIUM-DEXTROSE 2-4 GM/100ML-% IV SOLN
INTRAVENOUS | Status: AC
  Filled 2016-08-06: qty 100

## 2016-08-06 MED ORDER — MIDAZOLAM HCL 2 MG/2ML IJ SOLN
INTRAMUSCULAR | Status: DC | PRN
  Administered 2016-08-06: 2 mg via INTRAVENOUS

## 2016-08-06 MED ORDER — LACTATED RINGERS IV SOLN
INTRAVENOUS | Status: DC
  Administered 2016-08-06: 11:00:00 via INTRAVENOUS

## 2016-08-06 MED ORDER — MIDAZOLAM HCL 2 MG/2ML IJ SOLN
INTRAMUSCULAR | Status: AC
  Filled 2016-08-06: qty 2

## 2016-08-06 MED ORDER — ONDANSETRON HCL 4 MG/2ML IJ SOLN: 4.0000 mg | Freq: Once | INTRAMUSCULAR | Status: DC | PRN

## 2016-08-06 MED ORDER — LACTATED RINGERS IR SOLN
Status: DC | PRN
  Administered 2016-08-06: 200 mL

## 2016-08-06 MED ORDER — CHLORHEXIDINE GLUCONATE CLOTH 2 % EX PADS
6.0000 | MEDICATED_PAD | Freq: Once | CUTANEOUS | Status: AC
  Administered 2016-08-06: 6 via TOPICAL

## 2016-08-06 MED ORDER — OXYCODONE-ACETAMINOPHEN 5-325 MG PO TABS: 1.0000 | ORAL_TABLET | ORAL | 0 refills | Status: DC | PRN

## 2016-08-06 MED ORDER — PROPOFOL 10 MG/ML IV BOLUS
INTRAVENOUS | Status: DC | PRN
  Administered 2016-08-06: 130 mg via INTRAVENOUS

## 2016-08-06 MED ORDER — ACETAMINOPHEN 10 MG/ML IV SOLN
INTRAVENOUS | Status: DC | PRN
  Administered 2016-08-06: 1000 mg via INTRAVENOUS

## 2016-08-06 MED ORDER — KETOROLAC TROMETHAMINE 30 MG/ML IJ SOLN
INTRAMUSCULAR | Status: AC
  Filled 2016-08-06: qty 1

## 2016-08-06 MED ORDER — SUGAMMADEX SODIUM 200 MG/2ML IV SOLN
INTRAVENOUS | Status: DC | PRN
  Administered 2016-08-06: 160 mg via INTRAVENOUS

## 2016-08-06 MED ORDER — FENTANYL CITRATE (PF) 100 MCG/2ML IJ SOLN
INTRAMUSCULAR | Status: AC
  Filled 2016-08-06: qty 2

## 2016-08-06 MED ORDER — PROPOFOL 10 MG/ML IV BOLUS
INTRAVENOUS | Status: AC
  Filled 2016-08-06: qty 20

## 2016-08-06 MED ORDER — ROCURONIUM BROMIDE 50 MG/5ML IV SOLN
INTRAVENOUS | Status: AC
  Filled 2016-08-06: qty 1

## 2016-08-06 SURGICAL SUPPLY — 39 items
ANCHOR TIS RET SYS 235ML (MISCELLANEOUS) ×2 IMPLANT
APPLICATOR SURGIFLO ENDO (HEMOSTASIS) IMPLANT
APPLIER CLIP LOGIC TI 5 (MISCELLANEOUS) ×2 IMPLANT
BLADE SURG 11 STRL SS SAFETY (MISCELLANEOUS) ×2 IMPLANT
CANISTER SUCT 1200ML W/VALVE (MISCELLANEOUS) ×2 IMPLANT
CANNULA DILATOR 10 W/SLV (CANNULA) ×2 IMPLANT
CATH CHOLANG 76X19 KUMAR (CATHETERS) ×2 IMPLANT
CHLORAPREP W/TINT 26ML (MISCELLANEOUS) ×2 IMPLANT
DEFOGGER SCOPE WARMER CLEARIFY (MISCELLANEOUS) ×2 IMPLANT
DRAPE C-ARM XRAY 36X54 (DRAPES) ×2 IMPLANT
DRAPE INCISE IOBAN 66X45 STRL (DRAPES) ×2 IMPLANT
DRSG TEGADERM 2-3/8X2-3/4 SM (GAUZE/BANDAGES/DRESSINGS) ×8 IMPLANT
DRSG TELFA 4X3 1S NADH ST (GAUZE/BANDAGES/DRESSINGS) ×2 IMPLANT
ELECT REM PT RETURN 9FT ADLT (ELECTROSURGICAL) ×2
ELECTRODE REM PT RTRN 9FT ADLT (ELECTROSURGICAL) ×1 IMPLANT
GLOVE BIO SURGEON STRL SZ7 (GLOVE) ×14 IMPLANT
GOWN STRL REUS W/ TWL LRG LVL3 (GOWN DISPOSABLE) ×3 IMPLANT
GOWN STRL REUS W/TWL LRG LVL3 (GOWN DISPOSABLE) ×3
GRASPER SUT TROCAR 14GX15 (MISCELLANEOUS) ×2 IMPLANT
HEMOSTAT SURGICEL 2X3 (HEMOSTASIS) IMPLANT
IRRIGATION STRYKERFLOW (MISCELLANEOUS) ×1 IMPLANT
IRRIGATOR STRYKERFLOW (MISCELLANEOUS) ×2
IV LACTATED RINGERS 1000ML (IV SOLUTION) ×2 IMPLANT
KIT RM TURNOVER STRD PROC AR (KITS) ×2 IMPLANT
LABEL OR SOLS (LABEL) ×2 IMPLANT
NDL INSUFF ACCESS 14 VERSASTEP (NEEDLE) ×2 IMPLANT
PACK LAP CHOLECYSTECTOMY (MISCELLANEOUS) ×2 IMPLANT
SCISSORS METZENBAUM CVD 33 (INSTRUMENTS) ×2 IMPLANT
SLEEVE ENDOPATH XCEL 5M (ENDOMECHANICALS) ×4 IMPLANT
SPOGE SURGIFLO 8M (HEMOSTASIS)
SPONGE SURGIFLO 8M (HEMOSTASIS) IMPLANT
STRIP CLOSURE SKIN 1/2X4 (GAUZE/BANDAGES/DRESSINGS) ×2 IMPLANT
SUT VIC AB 0 CT2 27 (SUTURE) ×2 IMPLANT
SUT VIC AB 0 SH 27 (SUTURE) ×2 IMPLANT
SUT VIC AB 4-0 FS2 27 (SUTURE) ×4 IMPLANT
SUT VICRYL 0 AB UR-6 (SUTURE) ×2 IMPLANT
SWABSTK COMLB BENZOIN TINCTURE (MISCELLANEOUS) ×2 IMPLANT
TROCAR XCEL NON-BLD 5MMX100MML (ENDOMECHANICALS) ×2 IMPLANT
TUBING INSUFFLATOR HI FLOW (MISCELLANEOUS) ×2 IMPLANT

## 2016-08-06 NOTE — H&P (View-Only) (Signed)
Patient ID: Chelsey [name], female   DOB: December 26, 1950, 66 y.o.   MRN: [id number]  Chief Complaint  Patient presents with  . Follow-up    CT scan    HPI Chelsey [name] is a 66 y.o. female.  Here today to discuss HIDA scan results. HIDA scan done 07-09-16. She denied having any pain or discomfort during the scan. She still feels fulness from upper abdomen into her chest shortly after eating.     HPI  Past Medical History:  Diagnosis Date  . Breast cancer ([place]) 1997   right breast, radiation  . Bronchitis    recent/ 06/09/15 [facility]/resolved  . Cancer [facility]) 1997   right lumpectomy,L/Ad/R   . GERD (gastroesophageal reflux disease)   . Hypercholesteremia   . Hyperlipidemia   . Hypertension   . Personal history of malignant neoplasm of breast     Past Surgical History:  Procedure Laterality Date  . BREAST EXCISIONAL BIOPSY Right 1997   pos  . BREAST SURGERY Right 1997   lumpectomy  . COLONOSCOPY  2008  . COLONOSCOPY WITH PROPOFOL N/A 07/21/2015   Procedure: COLONOSCOPY WITH PROPOFOL;  Surgeon: Lucilla Lame, MD;  Location: [place];  Service: Endoscopy;  Laterality: N/A;  . ESOPHAGOGASTRODUODENOSCOPY (EGD) WITH PROPOFOL N/A 06/16/2016   Procedure: ESOPHAGOGASTRODUODENOSCOPY (EGD) WITH PROPOFOL;  Surgeon: Christene Lye, MD;  Location: [facility];  Service: Endoscopy;  Laterality: N/A;    Family History  Problem Relation Age of Onset  . Anxiety disorder Brother   . Obesity Brother   . COPD Brother   . Kidney disease Mother   . Heart attack Father   . Hypertension Father   . Alcohol abuse Father   . Depression Brother   . Anxiety disorder Brother   . Breast cancer Maternal Grandmother     Social History Social History  Substance Use Topics  . Smoking status: Light Tobacco Smoker    Packs/day: 0.25    Years: 5.00    Types: Cigarettes    Start date: 11/25/2009    Last attempt to quit: 03/09/2016  . Smokeless tobacco:  Never Used     Comment: Has tried patches and may go back on these again.  Says they work.   . Alcohol use No     Comment: rarely    No Known Allergies  Current Outpatient Prescriptions  Medication Sig Dispense Refill  . alendronate (FOSAMAX) 70 MG tablet Take 1 tablet (70 mg total) by mouth once a week. 12 tablet 3  . aspirin 81 MG chewable tablet Chew 81 mg by mouth daily. am    . Calcium Carbonate-Vitamin D (CALCIUM + D PO) Take 1 tablet by mouth daily. am    . hydrOXYzine (ATARAX/VISTARIL) 25 MG tablet Take 1 tablet (25 mg total) by mouth at bedtime. 90 tablet 2  . mirtazapine (REMERON) 30 MG tablet Take 1 tablet (30 mg total) by mouth at bedtime. 90 tablet 3  . Multiple Vitamin (MULTIVITAMIN) tablet Take 1 tablet by mouth daily. am    . omeprazole (PRILOSEC) 20 MG capsule Take 1 capsule (20 mg total) by mouth 2 (two) times daily before a meal. 90 capsule 3  . simvastatin (ZOCOR) 20 MG tablet Take 1 tablet (20 mg total) by mouth daily. bedtime 90 tablet 1   No current facility-administered medications for this visit.     Review of Systems Review of Systems  Constitutional: Negative.   Respiratory: Negative.   Cardiovascular:  Negative.     Blood pressure 122/76, pulse 83, resp. rate 12, height 5\' 5"  (1.651 m), weight 177 lb (80.3 kg).  Physical Exam Physical Exam  Constitutional: She is oriented to person, place, and time. She appears well-developed and well-nourished.  Eyes: Conjunctivae are normal. No scleral icterus.  Neck: Neck supple.  Cardiovascular: Normal rate, regular rhythm and normal heart sounds.   Pulmonary/Chest: Effort normal and breath sounds normal.  Abdominal: Soft. Bowel sounds are normal. There is no hepatomegaly.  Lymphadenopathy:    She has no cervical adenopathy.  Neurological: She is alert and oriented to person, place, and time.  Skin: Skin is warm and dry.  Psychiatric: She has a normal mood and affect. Her behavior is normal.    Data  Reviewed Prior notes and HIDA scan reviewed. HIDA scan results shows EF of 8%-extremely low. [place] had shown gallbaldder wall thickening Assessment    Epigastric pain, post prandial fullness- pt is following up, HIDA scan shows very low ejection fraction. This coupled with gallbaldder wall thickening suggests an abnormal gallbladder and is likely the cause of her abdominal symptom. Upper endoscopy was essentially normal.    Plan   Reasonable to consider cholecystectomy. Reasons, risks and benefits explained  And pt is agreeable. Laparoscopic Cholecystectomy with Intraoperative Cholangiogram. The procedure, including it's potential risks and complications (including but not limited to infection, bleeding, injury to intra-abdominal organs or bile ducts, bile leak, poor cosmetic result, sepsis and death) were discussed with the patient in detail. Non-operative options, including their inherent risks (acute calculous cholecystitis with possible choledocholithiasis or gallstone pancreatitis, with the risk of ascending cholangitis, sepsis, and death) were discussed as well. The patient expressed and understanding of what we discussed and wishes to proceed with laparoscopic cholecystectomy. The patient further understands that if it is technically not possible, or it is unsafe to proceed laparoscopically, that I will convert to an open cholecystectomy.     HPI, Physical Exam, Assessment and Plan have been scribed under the direction and in the presence of Mckinley Jewel, MD  Karie Fetch, RN I have completed the exam and reviewed the above documentation for accuracy and completeness.  I agree with the above.  Haematologist has been used and any errors in dictation or transcription are unintentional.  Nima Bamburg G. Jamal Collin, M.D., F.A.C.S.   Junie Panning G 07/14/2016, 4:55 PM  Patient's surgery has been scheduled for 08-06-16 at [facility]. It is okay for patient to continue an 81 mg aspirin once  daily.   Dominga Ferry, CMA

## 2016-08-06 NOTE — Op Note (Signed)
Preop diagnosis: Chronic cholecystitis  Post op diagnosis: Same  Operation: Laparoscopy cholecystectomy with intraoperative cholangiogram  Surgeon: Mckinley Jewel   Assistant:   Anesthesia: Gen.  Complications: None  EBL: Less than 20 mL  Drains: None  Description: Patient was put to sleep the supine position the operating table. Abdomen was prepped and draped as sterile field and timeout performed. Initial port incision was made at the umbilicus on the inferior lip and a Veress needle position in the peritoneal cavity verified of the hanging drop method. Pneumoperitoneum was obtained followed by placement of a 10 mm port. The camera was introduced with good visualization the peritoneal cavity.. Gallbladder was inspected again a subsequent epigastric and 2 lateral 5 mm ports were placed. It was noted the duodenum was tented up by somewhat the amount of dense adhesion to the midportion of the gallbladder and this was fairly a significant the looping adhesion. With careful exposure the adhesion was taken down and the duodenum freed away from the middle of the gallbladder on all the way down to area of the cystic duct. Following this the Hartman's pouch was lifted up and dissection carried out around the cystic duct and artery both of which were freed completely. The cystic artery was hemoclipped and cut. A cholangiogram was performed with the Kumar clamp and catheter. Cholangiogram was showing no evidence of obstruction to flow or any filling defects. The catheter was removed and the cystic duct was hemoclipped and cut. Gallbladder was dissected free from its bed using cautery for control of bleeding. The area was irrigated with some saline which was then suctioned out. Gallbladder was then placed in a retrieval bag and brought out through the umbilical port site. The fundus of the gallbladder was already markedly thickened. The bile was somewhat thick and darker color. No definite stones were  identified. Following this the right upper quadrant was inspected in the again irrigated and all fluid suctioned out. Pneumoperitoneum was released and the remaining ports removed. The umbilical port site fascia was closed with the figure-of-eight stitch of 0 Vicryl. All the skin incisions closed with subcuticular 4-0 Vicryl reinforced with Steri-Strips and tincture benzoin. Telfa and Tegaderm dressings were placed. Patient was subsequently returned recovery room stable condition

## 2016-08-06 NOTE — Interval H&P Note (Signed)
History and Physical Interval Note:  08/06/2016 12:02 PM  Chelsey [name]  has presented today for surgery, with the diagnosis of CHRONIC CHOLECYSTITIS  The various methods of treatment have been discussed with the patient and family. After consideration of risks, benefits and other options for treatment, the patient has consented to  Procedure(s): LAPAROSCOPIC CHOLECYSTECTOMY WITH INTRAOPERATIVE CHOLANGIOGRAM (N/A) as a surgical intervention .  The patient's history has been reviewed, patient examined, no change in status, stable for surgery.  I have reviewed the patient's chart and labs.  Questions were answered to the patient's satisfaction.     Evett Kassa G

## 2016-08-06 NOTE — Progress Notes (Signed)
Gauze dressing instead of dermabond x 4

## 2016-08-06 NOTE — Anesthesia Post-op Follow-up Note (Cosign Needed)
Anesthesia QCDR form completed.        

## 2016-08-06 NOTE — Anesthesia Preprocedure Evaluation (Signed)
Anesthesia Evaluation  Patient identified by MRN, date of birth, ID band Patient awake    Reviewed: Allergy & Precautions, NPO status , Patient's Chart, lab work & pertinent test results  History of Anesthesia Complications Negative for: history of anesthetic complications  Airway Mallampati: III       Dental   Pulmonary neg COPD, Current Smoker,           Cardiovascular (-) hypertension(-) CAD and (-) CHF (-) Valvular Problems/Murmurs     Neuro/Psych Anxiety Depression    GI/Hepatic Neg liver ROS, GERD  Medicated and Controlled,  Endo/Other  negative endocrine ROS  Renal/GU negative Renal ROS     Musculoskeletal   Abdominal   Peds  Hematology   Anesthesia Other Findings   Reproductive/Obstetrics                             Anesthesia Physical Anesthesia Plan  ASA: II  Anesthesia Plan: General   Post-op Pain Management:    Induction: Intravenous  PONV Risk Score and Plan: 3 and Ondansetron, Dexamethasone, Propofol and Midazolam  Airway Management Planned: Oral ETT  Additional Equipment:   Intra-op Plan:   Post-operative Plan:   Informed Consent: I have reviewed the patients History and Physical, chart, labs and discussed the procedure including the risks, benefits and alternatives for the proposed anesthesia with the patient or authorized representative who has indicated his/her understanding and acceptance.     Plan Discussed with:   Anesthesia Plan Comments:         Anesthesia Quick Evaluation

## 2016-08-06 NOTE — Transfer of Care (Signed)
Immediate Anesthesia Transfer of Care Note  Patient: Chelsey [name]  Procedure(s) Performed: Procedure(s): LAPAROSCOPIC CHOLECYSTECTOMY WITH INTRAOPERATIVE CHOLANGIOGRAM (N/A)  Patient Location: PACU  Anesthesia Type:General  Level of Consciousness: sedated and responds to stimulation  Airway & Oxygen Therapy: Patient Spontanous Breathing and Patient connected to face mask oxygen  Post-op Assessment: Report given to RN and Post -op Vital signs reviewed and stable  Post vital signs: Reviewed and stable  Last Vitals:  Vitals:   08/06/16 1404 08/06/16 1405  BP: 138/75 138/75  Pulse: 72 72  Resp: 16 17  Temp: (!) 36 C 36.8 C    Last Pain:  Vitals:   08/06/16 1405  TempSrc: Temporal  PainSc:          Complications: No apparent anesthesia complications

## 2016-08-06 NOTE — Anesthesia Postprocedure Evaluation (Signed)
Anesthesia Post Note  Patient: Chelsey [name]  Procedure(s) Performed: Procedure(s) (LRB): LAPAROSCOPIC CHOLECYSTECTOMY WITH INTRAOPERATIVE CHOLANGIOGRAM (N/A)  Patient location during evaluation: PACU Anesthesia Type: General Level of consciousness: awake and alert Pain management: pain level controlled Vital Signs Assessment: post-procedure vital signs reviewed and stable Respiratory status: spontaneous breathing and respiratory function stable Cardiovascular status: stable Anesthetic complications: no     Last Vitals:  Vitals:   08/06/16 1440 08/06/16 1444  BP: 118/88 128/75  Pulse: 71 71  Resp: 15 16  Temp: (!) 36.1 C (!) 35.7 C    Last Pain:  Vitals:   08/06/16 1444  TempSrc: Temporal  PainSc: 0-No pain                 KEPHART,WILLIAM K

## 2016-08-06 NOTE — Anesthesia Procedure Notes (Signed)
Procedure Name: Intubation Date/Time: 08/06/2016 12:32 PM Performed by: Jonna Clark Pre-anesthesia Checklist: Patient identified, Patient being monitored, Timeout performed, Emergency Drugs available and Suction available Patient Re-evaluated:Patient Re-evaluated prior to induction Oxygen Delivery Method: Circle system utilized Preoxygenation: Pre-oxygenation with 100% oxygen Induction Type: IV induction Ventilation: Mask ventilation without difficulty Laryngoscope Size: Miller and 2 Grade View: Grade I Tube type: Oral Tube size: 7.0 mm Number of attempts: 1 Placement Confirmation: ETT inserted through vocal cords under direct vision,  positive ETCO2 and breath sounds checked- equal and bilateral Secured at: 21 cm Tube secured with: Tape Dental Injury: Teeth and Oropharynx as per pre-operative assessment

## 2016-08-07 ENCOUNTER — Encounter: Payer: Self-pay | Admitting: General Surgery

## 2016-08-10 LAB — SURGICAL PATHOLOGY

## 2016-08-12 ENCOUNTER — Telehealth: Payer: Self-pay | Admitting: *Deleted

## 2016-08-12 NOTE — Telephone Encounter (Signed)
Would like work note, she has f/u appt on Monday return to work Tuesday. OK done.

## 2016-08-16 ENCOUNTER — Encounter: Payer: Self-pay | Admitting: General Surgery

## 2016-08-16 ENCOUNTER — Ambulatory Visit (INDEPENDENT_AMBULATORY_CARE_PROVIDER_SITE_OTHER): Payer: Federal, State, Local not specified - PPO | Admitting: General Surgery

## 2016-08-16 VITALS — BP 136/74 | HR 79 | Resp 14 | Ht 67.0 in | Wt 178.0 lb

## 2016-08-16 DIAGNOSIS — K811 Chronic cholecystitis: Secondary | ICD-10-CM

## 2016-08-16 NOTE — Progress Notes (Signed)
Patient ID: Chelsey [name], female   DOB: 1950-05-15, 66 y.o.   MRN: [id number]  Chief Complaint  Patient presents with  . Routine Post Op    gallbladder    HPI Chelsey [name] is a 66 y.o. female here today for her post op gallbladder removal done on 08/06/2016. Patient states she is doing well. Reports that her postprandial fullness has nearly resolved. Does report frequently passing gas. Has no other complaints.  HPI  Past Medical History:  Diagnosis Date  . Breast cancer ([place]) 1997   right breast, radiation  . Bronchitis    recent/ 06/09/15 [facility]/resolved  . Cancer [facility]) 1997   right lumpectomy,L/Ad/R   . GERD (gastroesophageal reflux disease)   . Hypercholesteremia   . Hyperlipidemia   . Personal history of malignant neoplasm of breast     Past Surgical History:  Procedure Laterality Date  . BREAST EXCISIONAL BIOPSY Right 1997   pos  . BREAST SURGERY Right 1997   lumpectomy  . CHOLECYSTECTOMY N/A 08/06/2016   Procedure: LAPAROSCOPIC CHOLECYSTECTOMY WITH INTRAOPERATIVE CHOLANGIOGRAM;  Surgeon: Christene Lye, MD;  Location: [facility];  Service: General;  Laterality: N/A;  . COLONOSCOPY  2008  . COLONOSCOPY WITH PROPOFOL N/A 07/21/2015   Procedure: COLONOSCOPY WITH PROPOFOL;  Surgeon: Lucilla Lame, MD;  Location: [place];  Service: Endoscopy;  Laterality: N/A;  . ESOPHAGOGASTRODUODENOSCOPY (EGD) WITH PROPOFOL N/A 06/16/2016   Procedure: ESOPHAGOGASTRODUODENOSCOPY (EGD) WITH PROPOFOL;  Surgeon: Christene Lye, MD;  Location: [facility];  Service: Endoscopy;  Laterality: N/A;    Family History  Problem Relation Age of Onset  . Anxiety disorder Brother   . Obesity Brother   . COPD Brother   . Kidney disease Mother   . Heart attack Father   . Hypertension Father   . Alcohol abuse Father   . Depression Brother   . Anxiety disorder Brother   . Breast cancer Maternal Grandmother     Social History Social  History  Substance Use Topics  . Smoking status: Light Tobacco Smoker    Packs/day: 0.25    Years: 5.00    Types: Cigarettes    Start date: 11/25/2009    Last attempt to quit: 03/09/2016  . Smokeless tobacco: Never Used     Comment: Has tried patches and may go back on these again.  Says they work.   . Alcohol use No     Comment: rarely    No Known Allergies  Current Outpatient Prescriptions  Medication Sig Dispense Refill  . alendronate (FOSAMAX) 70 MG tablet Take 1 tablet (70 mg total) by mouth once a week. 12 tablet 3  . aspirin 81 MG chewable tablet Chew 81 mg by mouth daily. am    . Calcium Carbonate-Vitamin D (CALCIUM + D PO) Take 1 tablet by mouth daily. am    . Cholecalciferol (VITAMIN D) 2000 units tablet Take 2,000 Units by mouth daily.    . hydrOXYzine (ATARAX/VISTARIL) 25 MG tablet Take 1 tablet (25 mg total) by mouth at bedtime. (Patient taking differently: Take 25 mg by mouth daily as needed for anxiety. ) 90 tablet 2  . mirtazapine (REMERON) 30 MG tablet Take 1 tablet (30 mg total) by mouth at bedtime. 90 tablet 3  . Multiple Vitamin (MULTIVITAMIN) tablet Take 1 tablet by mouth daily. am    . Nicotine (NICODERM CQ TD) Place onto the skin.    Marland Kitchen omeprazole (PRILOSEC) 20 MG capsule Take 1 capsule (  20 mg total) by mouth 2 (two) times daily before a meal. 90 capsule 3  . simvastatin (ZOCOR) 20 MG tablet Take 1 tablet (20 mg total) by mouth daily. bedtime 90 tablet 1   No current facility-administered medications for this visit.     Review of Systems Review of Systems  Constitutional: Negative.   Respiratory: Negative.   Cardiovascular: Negative.     Blood pressure 136/74, pulse 79, resp. rate 14, height 5\' 7"  (1.702 m), weight 178 lb (80.7 kg).  Physical Exam Physical Exam  Constitutional: She is oriented to person, place, and time. She appears well-developed and well-nourished.  Abdominal: Soft. Normal appearance and bowel sounds are normal. There is no  tenderness.  Port sites are clean and well-healed   Neurological: She is alert and oriented to person, place, and time.  Skin: Skin is warm and dry.    Data Reviewed Pathology report   Assessment  Chronic cholecystitis with cholecystectomy - confirmed by pathology report, no other pathological processes; stable exam    Plan   Pt may return to work tomorrow. Return as needed. The patient is aware to call back for any questions or concerns.  HPI, Physical Exam, Assessment and Plan have been scribed under the direction and in the presence of Mckinley Jewel, MD  Gaspar Cola, CMA     I have completed the exam and reviewed the above documentation for accuracy and completeness.  I agree with the above.  Haematologist has been used and any errors in dictation or transcription are unintentional.  Seeplaputhur G. Jamal Collin, M.D., F.A.C.S.  Seeplaputhur G. Jamal Collin, M.D., F.A.C.S.   Junie Panning G 08/16/2016, 10:22 AM

## 2016-08-16 NOTE — Patient Instructions (Signed)
Pt to return as needed. May return to work tomorrow.

## 2016-08-17 ENCOUNTER — Encounter: Payer: Self-pay | Admitting: Unknown Physician Specialty

## 2016-08-17 ENCOUNTER — Ambulatory Visit (INDEPENDENT_AMBULATORY_CARE_PROVIDER_SITE_OTHER): Payer: Federal, State, Local not specified - PPO | Admitting: Unknown Physician Specialty

## 2016-08-17 DIAGNOSIS — F410 Panic disorder [episodic paroxysmal anxiety] without agoraphobia: Secondary | ICD-10-CM

## 2016-08-17 MED ORDER — BUSPIRONE HCL 10 MG PO TABS: 10.0000 mg | ORAL_TABLET | Freq: Three times a day (TID) | ORAL | 0 refills | Status: DC

## 2016-08-17 MED ORDER — LORAZEPAM 0.5 MG PO TABS: 0.5000 mg | ORAL_TABLET | Freq: Two times a day (BID) | ORAL | 0 refills | Status: DC | PRN

## 2016-08-17 NOTE — Progress Notes (Signed)
BP 120/84   Pulse 87   Temp 98.3 F (36.8 C)   Wt 176 lb 12.8 oz (80.2 kg)   LMP  (LMP Unknown)   SpO2 98%   BMI 27.69 kg/m    Subjective:    Patient ID: Chelsey [name], female    DOB: 1950-08-22, 66 y.o.   MRN: [id number]  HPI: Chelsey [name] is a 66 y.o. female  Chief Complaint  Patient presents with  . Panic Attack    pt states she had a panic attack last night and today as well     Pt states she had an episode this morning including hyperventilation, hot and cold sweats, SOB, nausea, pounding heard, dizzyness, noises bothering her and tingling skin. She was out of work for gall bladder surgery and this was her first day back but she had to go home.  States Hydroxyzine didn't work well.  Pt does see a psychiatrist and has an appointment in August 6th.    States she didn't sleep that night and symptoms started previously  Depression screen [facility] 2/9 08/17/2016 07/05/2016  Decreased Interest 0 0  Down, Depressed, Hopeless 1 0  PHQ - 2 Score 1 0  Altered sleeping 1 0  Tired, decreased energy 1 0  Change in appetite 0 0  Feeling bad or failure about yourself  0 0  Trouble concentrating 1 0  Moving slowly or fidgety/restless 0 0  Suicidal thoughts 0 0  PHQ-9 Score 4 0      Relevant past medical, surgical, family and social history reviewed and updated as indicated. Interim medical history since our last visit reviewed. Allergies and medications reviewed and updated.  Review of Systems  Per HPI unless specifically indicated above     Objective:    BP 120/84   Pulse 87   Temp 98.3 F (36.8 C)   Wt 176 lb 12.8 oz (80.2 kg)   LMP  (LMP Unknown)   SpO2 98%   BMI 27.69 kg/m   Wt Readings from Last 3 Encounters:  08/17/16 176 lb 12.8 oz (80.2 kg)  08/16/16 178 lb (80.7 kg)  07/14/16 177 lb (80.3 kg)    Physical Exam  Constitutional: She is oriented to person, place, and time. She appears well-developed and well-nourished. No distress.  HENT:  Head:  Normocephalic and atraumatic.  Eyes: Conjunctivae and lids are normal. Right eye exhibits no discharge. Left eye exhibits no discharge. No scleral icterus.  Cardiovascular: Normal rate.   Pulmonary/Chest: Effort normal.  Abdominal: Normal appearance. There is no splenomegaly or hepatomegaly.  Musculoskeletal: Normal range of motion.  Neurological: She is alert and oriented to person, place, and time.  Skin: Skin is intact. No rash noted. No pallor.  Psychiatric: She has a normal mood and affect. Her behavior is normal. Judgment and thought content normal.     Assessment & Plan:   Problem List Items Addressed This Visit      Unprioritized   Panic attack    Rx for Buspar 10 mg TID prn.  It sounds like she was starting to panic the night before.  Will rx a short-term rx for Ativan QHS prn.  It will be up to the psychiast if that should be continued.  She will see her psychiatrist within 10 days for further advice on management.        Relevant Medications   busPIRone (BUSPAR) 10 MG tablet   LORazepam (ATIVAN) 0.5 MG tablet  Follow up plan: Return for f/u with psychiatrist.

## 2016-08-17 NOTE — Assessment & Plan Note (Addendum)
Rx for Buspar 10 mg TID prn.  It sounds like she was starting to panic the night before.  Will rx a short-term rx for Ativan QHS prn.  It will be up to the psychiast if that should be continued.  She will see her psychiatrist within 10 days for further advice on management.

## 2016-08-18 ENCOUNTER — Telehealth: Payer: Self-pay | Admitting: Unknown Physician Specialty

## 2016-08-18 NOTE — Telephone Encounter (Signed)
Called and let patient know what Malachy Mood said about medications.

## 2016-08-18 NOTE — Telephone Encounter (Signed)
Ask her to let me know how she is doing the beginning of next week and I will refill if working.

## 2016-08-18 NOTE — Telephone Encounter (Signed)
Patient has a question regarding the 2 medications she was given yesterday by Malachy Mood. She said she will not have enough of the meds to last until her visit with her psychiatrist and is not sure what she is supposed to do.  Please advise.   Thanks  [phone]  [name]

## 2016-08-18 NOTE — Telephone Encounter (Signed)
Routing to provider. Patient was given 10 days worth of buspirone and lorazepam but does not see psychiatry until 08/30/16. Please advise.

## 2016-08-19 ENCOUNTER — Emergency Department
Admission: EM | Admit: 2016-08-19 | Discharge: 2016-08-19 | Disposition: A | Discharge status: Home / Self Care | Payer: Federal, State, Local not specified - PPO | Attending: Student in an Organized Health Care Education/Training Program | Admitting: Student in an Organized Health Care Education/Training Program

## 2016-08-19 ENCOUNTER — Encounter: Payer: Self-pay | Admitting: Emergency Medicine

## 2016-08-19 ENCOUNTER — Emergency Department: Payer: Federal, State, Local not specified - PPO

## 2016-08-19 DIAGNOSIS — Z79899 Other long term (current) drug therapy: Secondary | ICD-10-CM | POA: Diagnosis not present

## 2016-08-19 DIAGNOSIS — F17210 Nicotine dependence, cigarettes, uncomplicated: Secondary | ICD-10-CM | POA: Insufficient documentation

## 2016-08-19 DIAGNOSIS — F419 Anxiety disorder, unspecified: Secondary | ICD-10-CM

## 2016-08-19 DIAGNOSIS — Z853 Personal history of malignant neoplasm of breast: Secondary | ICD-10-CM | POA: Diagnosis not present

## 2016-08-19 DIAGNOSIS — Z7982 Long term (current) use of aspirin: Secondary | ICD-10-CM | POA: Diagnosis not present

## 2016-08-19 DIAGNOSIS — R0789 Other chest pain: Secondary | ICD-10-CM | POA: Diagnosis not present

## 2016-08-19 DIAGNOSIS — F410 Panic disorder [episodic paroxysmal anxiety] without agoraphobia: Secondary | ICD-10-CM | POA: Insufficient documentation

## 2016-08-19 HISTORY — DX: Panic disorder (episodic paroxysmal anxiety): F41.0

## 2016-08-19 LAB — CBC WITH DIFFERENTIAL/PLATELET
BASOS PCT: 1 %
Basophils Absolute: 0.1 10*3/uL (ref 0–0.1)
EOS ABS: 0.4 10*3/uL (ref 0–0.7)
EOS PCT: 4 %
HEMATOCRIT: 42.5 % (ref 35.0–47.0)
HEMOGLOBIN: 14.6 g/dL (ref 12.0–16.0)
Lymphocytes Relative: 21 %
Lymphs Abs: 2.5 10*3/uL (ref 1.0–3.6)
MCH: 31.1 pg (ref 26.0–34.0)
MCHC: 34.3 g/dL (ref 32.0–36.0)
MCV: 90.9 fL (ref 80.0–100.0)
MONO ABS: 0.7 10*3/uL (ref 0.2–0.9)
MONOS PCT: 6 %
NEUTROS ABS: 8.4 10*3/uL — AB (ref 1.4–6.5)
Neutrophils Relative %: 68 %
Platelets: 317 10*3/uL (ref 150–440)
RBC: 4.67 MIL/uL (ref 3.80–5.20)
RDW: 13.6 % (ref 11.5–14.5)
WBC: 12.2 10*3/uL — ABNORMAL HIGH (ref 3.6–11.0)

## 2016-08-19 LAB — BASIC METABOLIC PANEL
Anion gap: 11 (ref 5–15)
BUN: 13 mg/dL (ref 6–20)
CHLORIDE: 105 mmol/L (ref 101–111)
CO2: 24 mmol/L (ref 22–32)
CREATININE: 0.59 mg/dL (ref 0.44–1.00)
Calcium: 9.8 mg/dL (ref 8.9–10.3)
GFR calc non Af Amer: >60 mL/min (ref >60)
Glucose, Bld: 102 mg/dL — ABNORMAL HIGH (ref 65–99)
Potassium: 3.9 mmol/L (ref 3.5–5.1)
Sodium: 140 mmol/L (ref 135–145)

## 2016-08-19 LAB — TROPONIN I: Troponin I: <0.03 ng/mL (ref <0.03)

## 2016-08-19 MED ORDER — SODIUM CHLORIDE 0.9 % IV BOLUS (SEPSIS)
500.0000 mL | Freq: Once | INTRAVENOUS | Status: AC
  Administered 2016-08-19: 500 mL via INTRAVENOUS

## 2016-08-19 MED ORDER — IOPAMIDOL (ISOVUE-370) INJECTION 76%
75.0000 mL | Freq: Once | INTRAVENOUS | Status: AC | PRN
  Administered 2016-08-19: 75 mL via INTRAVENOUS
  Filled 2016-08-19: qty 75

## 2016-08-19 NOTE — ED Triage Notes (Signed)
Pt reports new history of panic attacks that began Tuesday. Pt reports today was the first day back to work after gallbladder surgery on 08/06/16 and she had a panic attack at work. Pt saw PCP Tuesday and was prescribed lorazepam 2 times a day and buspar 3 times a day. Pt had a panic attack at 0200 today. Pt also takes mirtazapine and hydroxyzine at night that her psychiatrist prescribed. Pt reports the attacks as feeling an overwhelming sense of dread, hyperventilating, shortness of breath, and tingling sensations. Pt calm in triage. No obvious distress noted. Pt reports she just wants to make sure she is taking the medications correctly so that she can get some relief.

## 2016-08-19 NOTE — ED Provider Notes (Signed)
[facility] Emergency Department Provider Note    First MD Initiated Contact with Patient 08/19/16 1939     (approximate)  I have reviewed the triage vital signs and the nursing notes.   HISTORY  Chief Complaint Panic Attack    HPI Chelsey [name] is a 66 y.o. female history of breast cancer as well as panic attack presents with intermittent chest discomfort associated with shortness of breath diaphoresis flushed feeling an overwhelming sense of anxiety. Patient states is been ongoing off and on for the past several days. She is recently postop from cholecystectomy. Denies any lower extremity swelling. Is not on any sort of blood thinners. Does have some discomfort when she takes a deep breath.  Denies any previous history of heart attack.  The patient's primary concern today was wanting to have her medications evaluated. Patient was recently started on BuSpar as well as lorazepam but is not having any improvement in her symptoms and wanted her medications evaluated.  Denies any SI or HI.   Past Medical History:  Diagnosis Date  . Breast cancer ([place]) 1997   right breast, radiation  . Bronchitis    recent/ 06/09/15 had chest x[facility]/resolved  . Cancer [facility]) 1997   right lumpectomy,L/Ad/R   . GERD (gastroesophageal reflux disease)   . Hypercholesteremia   . Hyperlipidemia   . Panic attack   . Personal history of malignant neoplasm of breast    Family History  Problem Relation Age of Onset  . Anxiety disorder Brother   . Obesity Brother   . COPD Brother   . Kidney disease Mother   . Heart attack Father   . Hypertension Father   . Alcohol abuse Father   . Depression Brother   . Anxiety disorder Brother   . Breast cancer Maternal Grandmother    Past Surgical History:  Procedure Laterality Date  . BREAST EXCISIONAL BIOPSY Right 1997   pos  . BREAST SURGERY Right 1997   lumpectomy  . CHOLECYSTECTOMY N/A 08/06/2016   Procedure:  LAPAROSCOPIC CHOLECYSTECTOMY WITH INTRAOPERATIVE CHOLANGIOGRAM;  Surgeon: Christene Lye, MD;  Location: [facility];  Service: General;  Laterality: N/A;  . COLONOSCOPY  2008  . COLONOSCOPY WITH PROPOFOL N/A 07/21/2015   Procedure: COLONOSCOPY WITH PROPOFOL;  Surgeon: Lucilla Lame, MD;  Location: [place];  Service: Endoscopy;  Laterality: N/A;  . ESOPHAGOGASTRODUODENOSCOPY (EGD) WITH PROPOFOL N/A 06/16/2016   Procedure: ESOPHAGOGASTRODUODENOSCOPY (EGD) WITH PROPOFOL;  Surgeon: Christene Lye, MD;  Location: [facility];  Service: Endoscopy;  Laterality: N/A;   Patient Active Problem List   Diagnosis Date Noted  . Panic attack 08/17/2016  . Abdominal pain 05/21/2016  . Special screening for malignant neoplasms, colon   . Benign neoplasm of descending colon   . Benign neoplasm of sigmoid colon   . Gastroesophageal reflux disease without esophagitis 06/13/2014  . H/O hypercholesterolemia 06/13/2014  . Depression, major, recurrent, moderate ([place]) 06/13/2014  . History of breast cancer 03/12/2013      Prior to Admission medications   Medication Sig Start Date End Date Taking? Authorizing Provider  alendronate (FOSAMAX) 70 MG tablet Take 1 tablet (70 mg total) by mouth once a week. 07/05/16   Kathrine Haddock, NP  aspirin 81 MG chewable tablet Chew 81 mg by mouth daily. am    [provider]  busPIRone (BUSPAR) 10 MG tablet Take 1 tablet (10 mg total) by mouth 3 (three) times daily. 08/17/16   Kathrine Haddock, NP  Calcium Carbonate-Vitamin D (CALCIUM + D PO) Take 1 tablet by mouth daily. am    [provider]  Cholecalciferol (VITAMIN D) 2000 units tablet Take 2,000 Units by mouth daily.    [provider]  hydrOXYzine (ATARAX/VISTARIL) 25 MG tablet Take 1 tablet (25 mg total) by mouth at bedtime. Patient taking differently: Take 25 mg by mouth daily as needed for anxiety.  05/31/16   Rainey Pines, MD  LORazepam (ATIVAN) 0.5 MG tablet Take 1  tablet (0.5 mg total) by mouth 2 (two) times daily as needed for anxiety. 08/17/16   Kathrine Haddock, NP  mirtazapine (REMERON) 30 MG tablet Take 1 tablet (30 mg total) by mouth at bedtime. 05/31/16   Rainey Pines, MD  Multiple Vitamin (MULTIVITAMIN) tablet Take 1 tablet by mouth daily. am    [provider]  Nicotine (NICODERM CQ TD) Place onto the skin.    [provider]  omeprazole (PRILOSEC) 20 MG capsule Take 1 capsule (20 mg total) by mouth 2 (two) times daily before a meal. 07/05/16   Kathrine Haddock, NP  simvastatin (ZOCOR) 20 MG tablet Take 1 tablet (20 mg total) by mouth daily. bedtime 07/05/16   Kathrine Haddock, NP    Allergies Patient has no known allergies.    Social History Social History  Substance Use Topics  . Smoking status: Light Tobacco Smoker    Packs/day: 0.25    Years: 5.00    Types: Cigarettes    Start date: 11/25/2009    Last attempt to quit: 03/09/2016  . Smokeless tobacco: Never Used     Comment: Has tried patches and may go back on these again.  Says they work.   . Alcohol use No     Comment: rarely    Review of Systems Patient denies headaches, rhinorrhea, blurry vision, numbness, shortness of breath, chest pain, edema, cough, abdominal pain, nausea, vomiting, diarrhea, dysuria, fevers, rashes or hallucinations unless otherwise stated above in HPI. ____________________________________________   PHYSICAL EXAM:  VITAL SIGNS: Vitals:   08/19/16 1820  BP: 124/89  Pulse: 97  Resp: 18  Temp: 98.1 F (36.7 C)    Constitutional: Alert and oriented. Well appearing and in no acute distress. Eyes: Conjunctivae are normal.  Head: Atraumatic. Nose: No congestion/rhinnorhea. Mouth/Throat: Mucous membranes are moist.   Neck: Painless ROM.  Cardiovascular:   Good peripheral circulation. Respiratory: Normal respiratory effort.  No retractions.  Gastrointestinal: Soft and nontender.  Musculoskeletal: No lower extremity tenderness .  No joint  effusions. Neurologic:  Normal speech and language. No gross focal neurologic deficits are appreciated.  Skin:  Skin is warm, dry and intact. No rash noted. Psychiatric: Mood is anxious and affect is withdrawn. Speech and behavior are normal.  ____________________________________________   LABS (all labs ordered are listed, but only abnormal results are displayed)  Results for orders placed or performed during the hospital encounter of 08/19/16 (from the past 24 hour(s))  CBC with Differential/Platelet     Status: Abnormal   Collection Time: 08/19/16  8:12 PM  Result Value Ref Range   WBC 12.2 (H) 3.6 - 11.0 K/uL   RBC 4.67 3.80 - 5.20 MIL/uL   Hemoglobin 14.6 12.0 - 16.0 g/dL   HCT 42.5 35.0 - 47.0 %   MCV 90.9 80.0 - 100.0 fL   MCH 31.1 26.0 - 34.0 pg   MCHC 34.3 32.0 - 36.0 g/dL   RDW 13.6 11.5 - 14.5 %   Platelets 317 150 - 440 K/uL  Neutrophils Relative % 68 %   Neutro Abs 8.4 (H) 1.4 - 6.5 K/uL   Lymphocytes Relative 21 %   Lymphs Abs 2.5 1.0 - 3.6 K/uL   Monocytes Relative 6 %   Monocytes Absolute 0.7 0.2 - 0.9 K/uL   Eosinophils Relative 4 %   Eosinophils Absolute 0.4 0 - 0.7 K/uL   Basophils Relative 1 %   Basophils Absolute 0.1 0 - 0.1 K/uL  Basic metabolic panel     Status: Abnormal   Collection Time: 08/19/16  8:12 PM  Result Value Ref Range   Sodium 140 135 - 145 mmol/L   Potassium 3.9 3.5 - 5.1 mmol/L   Chloride 105 101 - 111 mmol/L   CO2 24 22 - 32 mmol/L   Glucose, Bld 102 (H) 65 - 99 mg/dL   BUN 13 6 - 20 mg/dL   Creatinine, Ser 0.59 0.44 - 1.00 mg/dL   Calcium 9.8 8.9 - 10.3 mg/dL   GFR calc non Af Amer >60 >60 mL/min   GFR calc Af Amer >60 >60 mL/min   Anion gap 11 5 - 15  Troponin I     Status: None   Collection Time: 08/19/16  8:12 PM  Result Value Ref Range   Troponin I <0.03 <0.03 ng/mL   ____________________________________________  EKG My review and personal interpretation at Time: 20:14   Indication: chest discomfort  Rate: 80   Rhythm: sinus Axis: normal sinus Other: normal intervals, no stemi or st depressions ____________________________________________  RADIOLOGY  I personally reviewed all radiographic images ordered to evaluate for the above acute complaints and reviewed radiology reports and findings.  These findings were personally discussed with the patient.  Please see medical record for radiology report.  ____________________________________________   PROCEDURES  Procedure(s) performed:  Procedures    Critical Care performed: no ____________________________________________   INITIAL IMPRESSION / ASSESSMENT AND PLAN / ED COURSE  Pertinent labs & imaging results that were available during my care of the patient were reviewed by me and considered in my medical decision making (see chart for details).  DDX: anxiety, pe, acs, dysrhythmia, electrolye abn  LACRESHIA BONDARENKO is a 66 y.o. who presents to the ED with above symptoms. Patient arrives afebrile hemodynamic stable. Patient's description of symptoms does seem clinically consistent with intermittent panic attacks over the patient is high risk and recently postop from surgery. Given her pain with shortness of breath to feel patient will require a metabolic workup as well as EKG and CT angiogram to evaluate for pulmonary embolism.    ----------------------------------------- 9:53 PM on 08/19/2016 -----------------------------------------  CT imaging without any evidence of pulmonary embolism. Mild leukocytosis likely appropriate in the postoperative setting. Her surgical sites do appear clean dry and intact. Her abdominal exam is soft and benign. Is not clinically consistent with ACS and she has a negative troponin is been having these symptoms for several days now. Do suspect that this is worsening anxiety. Have encouraged patient to make changes to her lorazepam and instead of taking it once in the morning and once at night, she should take it  every 6-8 hours only as needed. Patient has follow-up with psychiatry. She denies any SI or HI I do feel that she is stable for discharge home with the patient's son at bedside.  Have discussed with the patient and available family all diagnostics and treatments performed thus far and all questions were answered to the best of my ability. The patient demonstrates understanding and agreement with  plan.   ____________________________________________   FINAL CLINICAL IMPRESSION(S) / ED DIAGNOSES  Final diagnoses:  Anxiety  Chest discomfort      NEW MEDICATIONS STARTED DURING THIS VISIT:  New Prescriptions   No medications on file     Note:  This document was prepared using Dragon voice recognition software and may include unintentional dictation errors.     Merlyn Lot, MD 08/19/16 2155

## 2016-08-23 ENCOUNTER — Other Ambulatory Visit: Payer: Self-pay | Admitting: Unknown Physician Specialty

## 2016-08-23 MED ORDER — LORAZEPAM 0.5 MG PO TABS: 0.5000 mg | ORAL_TABLET | Freq: Two times a day (BID) | ORAL | 0 refills | Status: DC | PRN

## 2016-08-23 MED ORDER — BUSPIRONE HCL 10 MG PO TABS: 10.0000 mg | ORAL_TABLET | Freq: Three times a day (TID) | ORAL | 0 refills | Status: DC

## 2016-08-23 NOTE — Telephone Encounter (Signed)
Called and let patient know that medication were sent in to the pharmacy, lorazepam faxed in.

## 2016-08-23 NOTE — Telephone Encounter (Signed)
Routing to provider  

## 2016-08-23 NOTE — Telephone Encounter (Signed)
Pt would like a refill for buspar and lorazepam sent to [facility].

## 2016-08-30 ENCOUNTER — Ambulatory Visit (INDEPENDENT_AMBULATORY_CARE_PROVIDER_SITE_OTHER): Payer: Federal, State, Local not specified - PPO | Admitting: Psychiatry

## 2016-08-30 ENCOUNTER — Encounter: Payer: Self-pay | Admitting: Psychiatry

## 2016-08-30 VITALS — BP 95/63 | HR 91 | Temp 97.9°F | Wt 178.0 lb

## 2016-08-30 DIAGNOSIS — F331 Major depressive disorder, recurrent, moderate: Secondary | ICD-10-CM

## 2016-08-30 MED ORDER — MIRTAZAPINE 30 MG PO TABS: 30.0000 mg | ORAL_TABLET | Freq: Every day | ORAL | 3 refills | Status: DC

## 2016-08-30 MED ORDER — BUSPIRONE HCL 10 MG PO TABS: 10.0000 mg | ORAL_TABLET | Freq: Three times a day (TID) | ORAL | 1 refills | Status: DC

## 2016-08-30 MED ORDER — HYDROXYZINE HCL 25 MG PO TABS: 25.0000 mg | ORAL_TABLET | Freq: Every day | ORAL | 1 refills | Status: DC | PRN

## 2016-08-30 NOTE — Progress Notes (Signed)
BH MD/PA/NP OP Progress Note  08/30/2016 9:20 AM Chelsey [name]  MRN:  [id number]  Subjective:  Patient is a 66 year old female who presented for the follow-up appointment. She reportedthat she had her cholecystectomy done in the middle of July. She reported that she was having pain and now she is feeling better. Patient reported that she was started on lorazepam and BuSpar by her primary care physician after her surgery. She reported that she did not feel well on the lorazepam. She brought the bottles of the medication. Patient reported that she is still taking the BuSpar and discussed her medications. We discussed about stopping her lorazepam. Patient stated that she will continue on the BuSpar  is taking the hydroxyzine on a when necessary basis. She reported that she is doing well on her current medications. She denied having any suicidal ideations or plans. She has lost some weight since her surgery. She is compliant with her medications.She follows with her appointment on a regular basis.     She reported that she has been doing well and is sleeping good at night.    Patient denied having any suicidal homicidal ideations or plans.   Chief Complaint:  Chief Complaint    Follow-up; Medication Refill     Visit Diagnosis:     ICD-10-CM   1. MDD (major depressive disorder), recurrent episode, moderate ([place]) F33.1     Past Medical History:  Past Medical History:  Diagnosis Date  . Breast cancer ([place]) 1997   right breast, radiation  . Bronchitis    recent/ 06/09/15 [facility] x[facility]/resolved  . Cancer [facility]) 1997   right lumpectomy,L/Ad/R   . GERD (gastroesophageal reflux disease)   . Hypercholesteremia   . Hyperlipidemia   . Panic attack   . Personal history of malignant neoplasm of breast     Past Surgical History:  Procedure Laterality Date  . BREAST EXCISIONAL BIOPSY Right 1997   pos  . BREAST SURGERY Right 1997   lumpectomy  . CHOLECYSTECTOMY N/A  08/06/2016   Procedure: LAPAROSCOPIC CHOLECYSTECTOMY WITH INTRAOPERATIVE CHOLANGIOGRAM;  Surgeon: Christene Lye, MD;  Location: [facility];  Service: General;  Laterality: N/A;  . COLONOSCOPY  2008  . COLONOSCOPY WITH PROPOFOL N/A 07/21/2015   Procedure: COLONOSCOPY WITH PROPOFOL;  Surgeon: Lucilla Lame, MD;  Location: [place];  Service: Endoscopy;  Laterality: N/A;  . ESOPHAGOGASTRODUODENOSCOPY (EGD) WITH PROPOFOL N/A 06/16/2016   Procedure: ESOPHAGOGASTRODUODENOSCOPY (EGD) WITH PROPOFOL;  Surgeon: Christene Lye, MD;  Location: [facility];  Service: Endoscopy;  Laterality: N/A;   Family History:  Family History  Problem Relation Age of Onset  . Anxiety disorder Brother   . Obesity Brother   . COPD Brother   . Kidney disease Mother   . Heart attack Father   . Hypertension Father   . Alcohol abuse Father   . Depression Brother   . Anxiety disorder Brother   . Breast cancer Maternal Grandmother    Social History:  Social History   Social History  . Marital status: Widowed    Spouse name: N/A  . Number of children: N/A  . Years of education: N/A   Social History Main Topics  . Smoking status: Light Tobacco Smoker    Packs/day: 0.25    Years: 5.00    Types: Cigarettes    Start date: 11/25/2009    Last attempt to quit: 03/09/2016  . Smokeless tobacco: Never Used     Comment: Has tried patches and  may go back on these again.  Says they work.   . Alcohol use No     Comment: rarely  . Drug use: No  . Sexual activity: No   Other Topics Concern  . None   Social History Narrative  . None   Additional History:  Currently her son is living with her and patient is trying to help him move out into an apartment. 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  Assessment:   Musculoskeletal: Strength & Muscle Tone: within normal limits Gait & Station: normal Patient leans: N/A  Psychiatric Specialty Exam: Medication Refill     ROS   Blood pressure 95/63, pulse 91, temperature 97.9 F (36.6 C), temperature source Oral, weight 178 lb (80.7 kg).Body mass index is 27.88 kg/m.  General Appearance: Casual  Eye Contact:  Good  Speech:  Clear and Coherent  Volume:  Normal  Mood:  Euthymic  Affect:  Congruent  Thought Process:  Coherent  Orientation:  Full (Time, Place, and Person)  Thought Content:  WDL  Suicidal Thoughts:  No  Homicidal Thoughts:  No  Memory:  Immediate;   Fair  Judgement:  Fair  Insight:  Fair  Psychomotor Activity:  Decreased  Concentration:  Fair  Recall:  [employer] of Knowledge: Fair  Language: Fair  Akathisia:  No  Handed:  Right  AIMS (if indicated):    Assets:  Communication Skills Desire for Improvement Social Support  ADL's:  Intact  Cognition: WNL  Sleep: 6- 7 with trazodone    Is the patient at risk to self?  No. Has the patient been a risk to self in the past 6 months?  No. Has the patient been a risk to self within the distant past?  No. Is the patient a risk to others?  No. Has the patient been a risk to others in the past 6 months?  No. Has the patient been a risk to others within the distant past?  No.  Current Medications: Current Outpatient Prescriptions  Medication Sig Dispense Refill  . alendronate (FOSAMAX) 70 MG tablet Take 1 tablet (70 mg total) by mouth once a week. 12 tablet 3  . aspirin 81 MG chewable tablet Chew 81 mg by mouth daily. am    . busPIRone (BUSPAR) 10 MG tablet Take 1 tablet (10 mg total) by mouth 3 (three) times daily. 270 tablet 1  . Calcium Carbonate-Vitamin D (CALCIUM + D PO) Take 1 tablet by mouth daily. am    . Cholecalciferol (VITAMIN D) 2000 units tablet Take 2,000 Units by mouth daily.    . hydrOXYzine (ATARAX/VISTARIL) 25 MG tablet Take 1 tablet (25 mg total) by mouth daily as needed for anxiety. 90 tablet 1  . mirtazapine (REMERON) 30 MG tablet Take 1 tablet (30 mg total) by mouth at bedtime. 90 tablet 3  . Multiple Vitamin  (MULTIVITAMIN) tablet Take 1 tablet by mouth daily. am    . Nicotine (NICODERM CQ TD) Place onto the skin.    Marland Kitchen omeprazole (PRILOSEC) 20 MG capsule Take 1 capsule (20 mg total) by mouth 2 (two) times daily before a meal. 90 capsule 3  . simvastatin (ZOCOR) 20 MG tablet Take 1 tablet (20 mg total) by mouth daily. bedtime 90 tablet 1   No current facility-administered medications for this visit.     Medical Decision Making:  Review of Psycho-Social Stressors (1) and Review and summation of old records (2)  Treatment Plan  Summary:Medication management   Depression  She will also continue on Remeron 30 mg by mouth daily at bedtime . BuSpar 10 mg by mouth 3 times a day Hydroxyzine 25 mg by mouth daily at bedtime when necessary Medication refill for the 90 day supply     More than 50% of the time spent in psychoeducation, counseling and coordination of care.   Follow-up in 3 months or earlier depending on her symptoms  This note was generated in part or whole with voice recognition software. Voice regonition is usually quite accurate but there are transcription errors that can and very often do occur. I apologize for any typographical errors that were not detected and corrected.    Rainey Pines, MD    08/30/2016, 9:20 AM

## 2016-11-13 ENCOUNTER — Emergency Department
Admission: EM | Admit: 2016-11-13 | Discharge: 2016-11-13 | Disposition: A | Discharge status: Home / Self Care | Attending: Emergency Medicine | Admitting: Emergency Medicine

## 2016-11-13 ENCOUNTER — Emergency Department

## 2016-11-13 DIAGNOSIS — Y929 Unspecified place or not applicable: Secondary | ICD-10-CM | POA: Diagnosis not present

## 2016-11-13 DIAGNOSIS — S4992XA Unspecified injury of left shoulder and upper arm, initial encounter: Secondary | ICD-10-CM | POA: Diagnosis present

## 2016-11-13 DIAGNOSIS — Z9049 Acquired absence of other specified parts of digestive tract: Secondary | ICD-10-CM | POA: Insufficient documentation

## 2016-11-13 DIAGNOSIS — F329 Major depressive disorder, single episode, unspecified: Secondary | ICD-10-CM | POA: Insufficient documentation

## 2016-11-13 DIAGNOSIS — F17210 Nicotine dependence, cigarettes, uncomplicated: Secondary | ICD-10-CM | POA: Insufficient documentation

## 2016-11-13 DIAGNOSIS — Y9389 Activity, other specified: Secondary | ICD-10-CM | POA: Diagnosis not present

## 2016-11-13 DIAGNOSIS — W01198A Fall on same level from slipping, tripping and stumbling with subsequent striking against other object, initial encounter: Secondary | ICD-10-CM | POA: Insufficient documentation

## 2016-11-13 DIAGNOSIS — Z853 Personal history of malignant neoplasm of breast: Secondary | ICD-10-CM | POA: Insufficient documentation

## 2016-11-13 DIAGNOSIS — Z79899 Other long term (current) drug therapy: Secondary | ICD-10-CM | POA: Insufficient documentation

## 2016-11-13 DIAGNOSIS — S42355A Nondisplaced comminuted fracture of shaft of humerus, left arm, initial encounter for closed fracture: Secondary | ICD-10-CM | POA: Insufficient documentation

## 2016-11-13 DIAGNOSIS — Y990 Civilian activity done for income or pay: Secondary | ICD-10-CM | POA: Diagnosis not present

## 2016-11-13 DIAGNOSIS — Z7982 Long term (current) use of aspirin: Secondary | ICD-10-CM | POA: Insufficient documentation

## 2016-11-13 MED ORDER — OXYCODONE-ACETAMINOPHEN 5-325 MG PO TABS: 1.0000 | ORAL_TABLET | Freq: Four times a day (QID) | ORAL | 0 refills | Status: DC | PRN

## 2016-11-13 MED ORDER — OXYCODONE-ACETAMINOPHEN 5-325 MG PO TABS
1.0000 | ORAL_TABLET | Freq: Once | ORAL | Status: AC
  Administered 2016-11-13: 1 via ORAL
  Filled 2016-11-13: qty 1

## 2016-11-13 NOTE — ED Notes (Signed)
Chelsey [name] was contacted by this Probation officer for [employer] claim. Ms. [name], who is a Librarian, academic, states patient would need a blood alcohol level drawn and a urine drug screen.

## 2016-11-13 NOTE — Discharge Instructions (Signed)
Percocet 1 tablet every 8 hours as needed for pain.  Ice to shoulder to reduce swelling and help with pain control. Wear shoulder immobilizer for support. Call Dr. Rudene Christians office on Monday morning for an appointment time to be seen in the office. No working until seen by orthopedics and disposition made.

## 2016-11-13 NOTE — ED Notes (Signed)
See triage note.  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  Pt reports that a cat ran in between her legs and caused her to lose her balance and fall on her L shoulder.  Pt reports L shoulder pain at this time.

## 2016-11-13 NOTE — ED Notes (Signed)
Sister in room with patient

## 2016-11-13 NOTE — ED Triage Notes (Signed)
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 C/o left shoulder pain.

## 2016-11-13 NOTE — ED Notes (Signed)
This RN spoke with Llana Aliment, pt's supervisor, [phone] to clarify regarding blood alcohol test. This RN explained that per Theadora Rama, RN, blood alcohol levels were not performed by this hospital for workers comp. Deidre stated that if patient was filing workers comp then "do what ever you usually do", this RN explained to [employer] that since she was the patient's supervisor she was required to authorize UDS or breathalyzer or both. Per Llana Aliment, pt's supervisor, only perform UDS at this time.

## 2016-11-13 NOTE — ED Provider Notes (Signed)
[facility] Emergency Department Provider Note  ___________________________________________   First MD Initiated Contact with Patient 11/13/16 1424     (approximate)  I have reviewed the triage vital signs and the nursing notes.   HISTORY  Chief Complaint Shoulder Pain   HPI Chelsey [name] is a 66 y.o. female is here complaining of left shoulder pain. 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 She states that she fell sideways with most of the impact on her left side. She denies any head injury or loss of consciousness. She has been unable to support her use her arm since the fall. She denies any other injuries.  Workmen's Comp. Information was filled out.  She rates her pain as 7 out of 10.   Past Medical History:  Diagnosis Date  . Breast cancer ([place]) 1997   right breast, radiation  . Bronchitis    recent/ 06/09/15 [facility]/resolved  . Cancer [facility]) 1997   right lumpectomy,L/Ad/R   . GERD (gastroesophageal reflux disease)   . Hypercholesteremia   . Hyperlipidemia   . Panic attack   . Personal history of malignant neoplasm of breast     Patient Active Problem List   Diagnosis Date Noted  . Panic attack 08/17/2016  . Abdominal pain 05/21/2016  . Special screening for malignant neoplasms, colon   . Benign neoplasm of descending colon   . Benign neoplasm of sigmoid colon   . Gastroesophageal reflux disease without esophagitis 06/13/2014  . H/O hypercholesterolemia 06/13/2014  . Depression, major, recurrent, moderate ([place]) 06/13/2014  . History of breast cancer 03/12/2013    Past Surgical History:  Procedure Laterality Date  . BREAST EXCISIONAL BIOPSY Right 1997   pos  . BREAST SURGERY Right 1997   lumpectomy  . CHOLECYSTECTOMY N/A 08/06/2016   Procedure: LAPAROSCOPIC CHOLECYSTECTOMY WITH INTRAOPERATIVE CHOLANGIOGRAM;  Surgeon: Christene Lye, MD;  Location: [facility];  Service: General;  Laterality: N/A;  . COLONOSCOPY  2008  . COLONOSCOPY WITH PROPOFOL N/A 07/21/2015   Procedure: COLONOSCOPY WITH PROPOFOL;  Surgeon: Lucilla Lame, MD;  Location: [place];  Service: Endoscopy;  Laterality: N/A;  . ESOPHAGOGASTRODUODENOSCOPY (EGD) WITH PROPOFOL N/A 06/16/2016   Procedure: ESOPHAGOGASTRODUODENOSCOPY (EGD) WITH PROPOFOL;  Surgeon: Christene Lye, MD;  Location: [facility];  Service: Endoscopy;  Laterality: N/A;    Prior to Admission medications   Medication Sig Start Date End Date Taking? Authorizing Provider  alendronate (FOSAMAX) 70 MG tablet Take 1 tablet (70 mg total) by mouth once a week. 07/05/16   Kathrine Haddock, NP  aspirin 81 MG chewable tablet Chew 81 mg by mouth daily. am    [provider]  busPIRone (BUSPAR) 10 MG tablet Take 1 tablet (10 mg total) by mouth 3 (three) times daily. 08/30/16   Rainey Pines, MD  Calcium Carbonate-Vitamin D (CALCIUM + D PO) Take 1 tablet by mouth daily. am    [provider]  Cholecalciferol (VITAMIN D) 2000 units tablet Take 2,000 Units by mouth daily.    [provider]  hydrOXYzine (ATARAX/VISTARIL) 25 MG tablet Take 1 tablet (25 mg total) by mouth daily as needed for anxiety. 08/30/16   Rainey Pines, MD  mirtazapine (REMERON) 30 MG tablet Take 1 tablet (30 mg total) by mouth at bedtime. 08/30/16   Rainey Pines, MD  Multiple Vitamin (MULTIVITAMIN) tablet Take 1 tablet by mouth daily. am  [provider]  Nicotine (NICODERM CQ TD) Place onto the skin.    [provider]  omeprazole (PRILOSEC) 20 MG capsule Take 1 capsule (20 mg total) by mouth 2 (two) times daily before a meal. 07/05/16   Kathrine Haddock, NP  oxyCODONE-acetaminophen (PERCOCET) 5-325 MG tablet Take 1 tablet by mouth every 6 (six) hours as needed for severe pain. 11/13/16   Johnn Hai, PA-C  simvastatin (ZOCOR) 20 MG tablet Take 1 tablet (20 mg total)  by mouth daily. bedtime 07/05/16   Kathrine Haddock, NP    Allergies Patient has no known allergies.  Family History  Problem Relation Age of Onset  . Anxiety disorder Brother   . Obesity Brother   . COPD Brother   . Kidney disease Mother   . Heart attack Father   . Hypertension Father   . Alcohol abuse Father   . Depression Brother   . Anxiety disorder Brother   . Breast cancer Maternal Grandmother     Social History Social History  Substance Use Topics  . Smoking status: Light Tobacco Smoker    Packs/day: 0.25    Years: 5.00    Types: Cigarettes    Start date: 11/25/2009    Last attempt to quit: 03/09/2016  . Smokeless tobacco: Never Used     Comment: Has tried patches and may go back on these again.  Says they work.   . Alcohol use No     Comment: rarely    Review of Systems Constitutional: No fever/chills Eyes: No visual changes. ENT: no trauma Cardiovascular: Denies chest pain. Respiratory: Denies shortness of breath. Gastrointestinal: No abdominal pain.  No nausea, no vomiting.   Musculoskeletal: positive left shoulder pain. Skin: Negative for rash. Neurological: Negative for headaches, focal weakness or numbness. ____________________________________________   PHYSICAL EXAM:  VITAL SIGNS: ED Triage Vitals  Enc Vitals Group     BP 11/13/16 1406 (!) 132/91     Pulse Rate 11/13/16 1406 87     Resp 11/13/16 1406 16     Temp 11/13/16 1406 97.6 F (36.4 C)     Temp Source 11/13/16 1406 Oral     SpO2 11/13/16 1406 98 %     Weight 11/13/16 1405 180 lb (81.6 kg)     Height 11/13/16 1405 5\' 6"  (1.676 m)     Head Circumference --      Peak Flow --      Pain Score --      Pain Loc --      Pain Edu? --      Excl. in [place]? --    Constitutional: Alert and oriented. Well appearing and in no acute distress. Eyes: Conjunctivae are normal.  Head: Atraumatic. Neck: No stridor.  No cervical tenderness on palpation posteriorly. Cardiovascular: Normal rate, regular  rhythm. Grossly normal heart sounds.  Good peripheral circulation. Respiratory: Normal respiratory effort.  No retractions. Lungs CTAB. Gastrointestinal: Soft and nontender. No distention. Musculoskeletal: is moderate tenderness on palpation of the left shoulder. Soft  tissue swelling present. Range of motion was deferred secondary to her pain with any motion. No ecchymosis abrasions or erythema is present.  nontender forearm to palpation. Pulses positive. Motor sensory function distal to the injury is within normal limits. Neurologic:  Normal speech and language. No gross focal neurologic deficits are appreciated. Normal gait. Skin:  Skin is warm, dry and intact.  Psychiatric: Mood and affect are normal. Speech and behavior are normal.  ____________________________________________   LABS (all  labs ordered are listed, but only abnormal results are displayed)  Labs Reviewed - No data to display  RADIOLOGY  Ct Shoulder Left Wo Contrast  Result Date: 11/13/2016 CLINICAL DATA:  Status post fall.  Left shoulder pain. EXAM: CT OF THE UPPER LEFT EXTREMITY WITHOUT CONTRAST TECHNIQUE: Multidetector CT imaging of the upper left extremity was performed according to the standard protocol. COMPARISON:  None. FINDINGS: Bones/Joint/Cartilage Comminuted fracture of the surgical neck of the left proximal humerus with 2 cm of antro lateral displacement. 18 mm fracture fragment is medially displaced by 15 mm. Fracture extends to the greater tuberosity without significant displacement. 3 mm loose body along the superior glenoid. No aggressive lytic or sclerotic osseous lesion. No other fracture or dislocation. Mild arthropathy of the acromioclavicular joint. Type II acromion. Ligaments Ligaments are suboptimally evaluated by CT. Muscles and Tendons Muscles are normal.  No muscle atrophy. Soft tissue No fluid collection or hematoma. No soft tissue mass. Visualized left lung is clear. Large hiatal hernia. IMPRESSION:  1. Comminuted fracture of the surgical neck of the left proximal humerus with 2 cm of antro lateral displacement. 18 mm fracture fragment is medially displaced by 15 mm. Fracture extends to the greater tuberosity without significant displacement. Electronically Signed   By: Kathreen Devoid   On: 11/13/2016 16:15   Dg Shoulder Left  Result Date: 11/13/2016 CLINICAL DATA:  Fall today.  Left shoulder pain.  Initial encounter. EXAM: LEFT SHOULDER - 2+ VIEW COMPARISON:  None. FINDINGS: Highly comminuted fracture of the left humeral neck is seen. No evidence of dislocation. No other fractures or bone lesions identified. IMPRESSION: Highly comminuted left humeral neck fracture. Electronically Signed   By: Earle Gell M.D.   On: 11/13/2016 15:28    ____________________________________________   PROCEDURES  Procedure(s) performed: None  Procedures  Critical Care performed: No  ____________________________________________   INITIAL IMPRESSION / ASSESSMENT AND PLAN / ED COURSE Patient is given Percocet prior to her x-rays and was tolerated well.  X-ray results was discussed with patient. Dr. Daine Gip who is taking call for Dr. Rudene Christians was called and case was discussed. He suggested getting a CT scan prior to patient discharge. Patient is agreeable with this. Patient was placed in a shoulder immobilizer with a prescription for Percocet. She was given contact information for Dr. Rudene Christians who is the orthopedist on call. Workmen's Comp. Papers were filled out with patient not returning to work until seen and released by the orthopedist.   ___________________________________________   FINAL CLINICAL IMPRESSION(S) / ED DIAGNOSES  Final diagnoses:  Closed nondisplaced comminuted fracture of shaft of left humerus, initial encounter      NEW MEDICATIONS STARTED DURING THIS VISIT:  Discharge Medication List as of 11/13/2016  4:30 PM    START taking these medications   Details  oxyCODONE-acetaminophen  (PERCOCET) 5-325 MG tablet Take 1 tablet by mouth every 6 (six) hours as needed for severe pain., Starting Sat 11/13/2016, Print         Note:  This document was prepared using Dragon voice recognition software and may include unintentional dictation errors.    Johnn Hai, PA-C 11/13/16 Yevette Edwards    Lisa Roca, MD 11/14/16 [phone]

## 2016-11-22 ENCOUNTER — Encounter
Admission: RE | Admit: 2016-11-22 | Discharge: 2016-11-22 | Disposition: A | Discharge status: Home / Self Care | Payer: Federal, State, Local not specified - PPO | Source: Ambulatory Visit | Attending: Orthopedic Surgery | Admitting: Orthopedic Surgery

## 2016-11-22 LAB — TYPE AND SCREEN
ABO/RH(D): B POS
Antibody Screen: NEGATIVE

## 2016-11-22 LAB — URINALYSIS, ROUTINE W REFLEX MICROSCOPIC
BILIRUBIN URINE: NEGATIVE
GLUCOSE, UA: NEGATIVE mg/dL
Hgb urine dipstick: NEGATIVE
KETONES UR: NEGATIVE mg/dL
Leukocytes, UA: NEGATIVE
Nitrite: NEGATIVE
PH: 5 (ref 5.0–8.0)
Protein, ur: NEGATIVE mg/dL
Specific Gravity, Urine: 1.021 (ref 1.005–1.030)

## 2016-11-22 LAB — CBC
HCT: 37.5 % (ref 35.0–47.0)
Hemoglobin: 12.7 g/dL (ref 12.0–16.0)
MCH: 31.8 pg (ref 26.0–34.0)
MCHC: 33.8 g/dL (ref 32.0–36.0)
MCV: 94 fL (ref 80.0–100.0)
PLATELETS: 350 10*3/uL (ref 150–440)
RBC: 3.99 MIL/uL (ref 3.80–5.20)
RDW: 13.2 % (ref 11.5–14.5)
WBC: 8 10*3/uL (ref 3.6–11.0)

## 2016-11-22 LAB — PROTIME-INR
INR: 0.94
PROTHROMBIN TIME: 12.5 s (ref 11.4–15.2)

## 2016-11-22 LAB — BASIC METABOLIC PANEL
Anion gap: 10 (ref 5–15)
BUN: 15 mg/dL (ref 6–20)
CO2: 27 mmol/L (ref 22–32)
CREATININE: 0.73 mg/dL (ref 0.44–1.00)
Calcium: 9.3 mg/dL (ref 8.9–10.3)
Chloride: 100 mmol/L — ABNORMAL LOW (ref 101–111)
Glucose, Bld: 93 mg/dL (ref 65–99)
Potassium: 3.7 mmol/L (ref 3.5–5.1)
SODIUM: 137 mmol/L (ref 135–145)

## 2016-11-22 LAB — SURGICAL PCR SCREEN
MRSA, PCR: NEGATIVE
STAPHYLOCOCCUS AUREUS: NEGATIVE

## 2016-11-22 NOTE — Patient Instructions (Signed)
Your procedure is scheduled YB:[id number] 30, 2018 Report to Same Day Surgery on the 2nd floor in the [facility]. To find out your arrival time, please call [phone] between 1PM - 3PM on: November 22, 2016  REMEMBER: Instructions that are not followed completely may result in serious medical risk up to and including death; or upon the discretion of your surgeon and anesthesiologist your surgery may need to be rescheduled.  Do not eat food or drink liquids after midnight. No gum chewing or hard candies.  You may however, drink CLEAR liquids up to 2 hours before you are scheduled to arrive at the hospital for your procedure.  Do not drink clear liquids within 2 hours of your scheduled arrival to the hospital as this may lead to your procedure being delayed or rescheduled.  Clear liquids include: - water  - apple juice without pulp - clear gatorade - black coffee or tea (NO milk, creamers, sugars) DO NOT drink anything not on this list.   No Alcohol for 24 hours before or after surgery.  No Smoking for 24 hours prior to surgery.  Notify your doctor if there is any change in your medical condition (cold, fever, infection).  Do not wear jewelry, make-up, hairpins, clips or nail polish.  Do not wear lotions, powders, or perfumes.   Do not shave 48 hours prior to surgery. Men may shave face and neck.  Contacts and dentures may not be worn into surgery.  Do not bring valuables to the hospital. [facility] is not responsible for any belongings or valuables.   TAKE THESE MEDICATIONS THE MORNING OF SURGERY WITH A SIP OF WATER:  OMEPRAZOLE - TAKE A DOSE THE NIGHT BEFORE SURGERY AND THE MORNING OF SURGERY BUSPIRONE     Use CHG Soap or wipes as directed on instruction sheet.  Follow recommendations from Cardiologist, Pulmonologist or PCP regarding stopping Aspirin, Coumadin, Plavix, Eliquis, Pradaxa, or Pletal.  Stop Anti-inflammatories such as Advil, Aleve, Ibuprofen, Motrin,  Naproxen, Naprosyn, Goodie powder, or aspirin products. (May take Tylenol or Acetaminophen and Celebrex if needed.)  Stop supplements until after surgery. (May continue Vitamin D, Vitamin B, and multivitamin.)  If you are being admitted to the hospital overnight, leave your suitcase in the car. After surgery it may be brought to your room.  If you are being discharged the day of surgery, you will not be allowed to drive home. You will need someone to drive you home and stay with you that night.   If you are taking public transportation, you will need to have a responsible adult to with you.  Please call the number above if you have any questions about these instructions.

## 2016-11-22 NOTE — Pre-Procedure Instructions (Addendum)
EKG My review and personal interpretation at Time: 20:14   Indication: chest discomfort  Rate: 80  Rhythm: sinus Axis: normal sinus Other: normal intervals, no stemi or st depressions ____________________________________________ EKG FROM ED VISIT 08-20-2016

## 2016-11-23 ENCOUNTER — Inpatient Hospital Stay
Admission: RE | Admit: 2016-11-23 | Discharge: 2016-11-25 | DRG: 493 | Disposition: A | Discharge status: Home / Self Care | Payer: Federal, State, Local not specified - PPO | Source: Ambulatory Visit | Attending: Orthopedic Surgery | Admitting: Orthopedic Surgery

## 2016-11-23 ENCOUNTER — Inpatient Hospital Stay: Payer: Federal, State, Local not specified - PPO | Admitting: Anesthesiology

## 2016-11-23 ENCOUNTER — Encounter
Admission: RE | Disposition: A | Discharge status: Home / Self Care | Payer: Self-pay | Source: Ambulatory Visit | Attending: Orthopedic Surgery

## 2016-11-23 ENCOUNTER — Inpatient Hospital Stay: Payer: Federal, State, Local not specified - PPO

## 2016-11-23 ENCOUNTER — Encounter: Payer: Self-pay | Admitting: Anesthesiology

## 2016-11-23 DIAGNOSIS — S42209A Unspecified fracture of upper end of unspecified humerus, initial encounter for closed fracture: Secondary | ICD-10-CM | POA: Diagnosis present

## 2016-11-23 DIAGNOSIS — Y9301 Activity, walking, marching and hiking: Secondary | ICD-10-CM | POA: Diagnosis present

## 2016-11-23 DIAGNOSIS — Y92018 Other place in single-family (private) house as the place of occurrence of the external cause: Secondary | ICD-10-CM

## 2016-11-23 DIAGNOSIS — S42232A 3-part fracture of surgical neck of left humerus, initial encounter for closed fracture: Principal | ICD-10-CM | POA: Diagnosis present

## 2016-11-23 DIAGNOSIS — Z811 Family history of alcohol abuse and dependence: Secondary | ICD-10-CM

## 2016-11-23 DIAGNOSIS — Z9889 Other specified postprocedural states: Secondary | ICD-10-CM

## 2016-11-23 DIAGNOSIS — Z841 Family history of disorders of kidney and ureter: Secondary | ICD-10-CM

## 2016-11-23 DIAGNOSIS — Z9049 Acquired absence of other specified parts of digestive tract: Secondary | ICD-10-CM

## 2016-11-23 DIAGNOSIS — D62 Acute posthemorrhagic anemia: Secondary | ICD-10-CM | POA: Diagnosis not present

## 2016-11-23 DIAGNOSIS — Z419 Encounter for procedure for purposes other than remedying health state, unspecified: Secondary | ICD-10-CM

## 2016-11-23 DIAGNOSIS — Z7982 Long term (current) use of aspirin: Secondary | ICD-10-CM

## 2016-11-23 DIAGNOSIS — Z853 Personal history of malignant neoplasm of breast: Secondary | ICD-10-CM

## 2016-11-23 DIAGNOSIS — Z7983 Long term (current) use of bisphosphonates: Secondary | ICD-10-CM

## 2016-11-23 DIAGNOSIS — Y990 Civilian activity done for income or pay: Secondary | ICD-10-CM

## 2016-11-23 DIAGNOSIS — Z803 Family history of malignant neoplasm of breast: Secondary | ICD-10-CM

## 2016-11-23 DIAGNOSIS — M810 Age-related osteoporosis without current pathological fracture: Secondary | ICD-10-CM | POA: Diagnosis present

## 2016-11-23 DIAGNOSIS — Z79891 Long term (current) use of opiate analgesic: Secondary | ICD-10-CM

## 2016-11-23 DIAGNOSIS — E785 Hyperlipidemia, unspecified: Secondary | ICD-10-CM | POA: Diagnosis present

## 2016-11-23 DIAGNOSIS — K219 Gastro-esophageal reflux disease without esophagitis: Secondary | ICD-10-CM | POA: Diagnosis present

## 2016-11-23 DIAGNOSIS — W010XXA Fall on same level from slipping, tripping and stumbling without subsequent striking against object, initial encounter: Secondary | ICD-10-CM | POA: Diagnosis present

## 2016-11-23 DIAGNOSIS — E7800 Pure hypercholesterolemia, unspecified: Secondary | ICD-10-CM | POA: Diagnosis present

## 2016-11-23 DIAGNOSIS — Z8781 Personal history of (healed) traumatic fracture: Secondary | ICD-10-CM

## 2016-11-23 HISTORY — PX: ORIF HUMERUS FRACTURE: SHX2126

## 2016-11-23 HISTORY — PX: BICEPT TENODESIS: SHX5116

## 2016-11-23 LAB — URINE CULTURE: CULTURE: NO GROWTH

## 2016-11-23 LAB — ABO/RH: ABO/RH(D): B POS

## 2016-11-23 SURGERY — OPEN REDUCTION INTERNAL FIXATION (ORIF) PROXIMAL HUMERUS FRACTURE
Anesthesia: Regional | Site: Arm Upper | Laterality: Left | Wound class: Clean

## 2016-11-23 MED ORDER — DEXAMETHASONE SODIUM PHOSPHATE 10 MG/ML IJ SOLN
INTRAMUSCULAR | Status: DC | PRN
  Administered 2016-11-23: 10 mg via INTRAVENOUS

## 2016-11-23 MED ORDER — PROPOFOL 10 MG/ML IV BOLUS
INTRAVENOUS | Status: DC | PRN
  Administered 2016-11-23: 30 mg via INTRAVENOUS
  Administered 2016-11-23: 170 mg via INTRAVENOUS

## 2016-11-23 MED ORDER — DEXAMETHASONE SODIUM PHOSPHATE 10 MG/ML IJ SOLN
INTRAMUSCULAR | Status: AC
  Filled 2016-11-23: qty 1

## 2016-11-23 MED ORDER — METOCLOPRAMIDE HCL 10 MG PO TABS: 5.0000 mg | ORAL_TABLET | Freq: Three times a day (TID) | ORAL | Status: DC | PRN

## 2016-11-23 MED ORDER — HYDROXYZINE HCL 25 MG PO TABS
25.0000 mg | ORAL_TABLET | Freq: Every day | ORAL | Status: DC | PRN
  Filled 2016-11-23: qty 1

## 2016-11-23 MED ORDER — ONDANSETRON HCL 4 MG/2ML IJ SOLN: 4.0000 mg | Freq: Four times a day (QID) | INTRAMUSCULAR | Status: DC | PRN

## 2016-11-23 MED ORDER — ROCURONIUM BROMIDE 100 MG/10ML IV SOLN
INTRAVENOUS | Status: DC | PRN
  Administered 2016-11-23 (×2): 20 mg via INTRAVENOUS
  Administered 2016-11-23 (×2): 10 mg via INTRAVENOUS
  Administered 2016-11-23 (×3): 20 mg via INTRAVENOUS
  Administered 2016-11-23: 10 mg via INTRAVENOUS

## 2016-11-23 MED ORDER — MIDAZOLAM HCL 2 MG/2ML IJ SOLN
1.0000 mg | Freq: Once | INTRAMUSCULAR | Status: AC
  Administered 2016-11-23: 1 mg via INTRAVENOUS

## 2016-11-23 MED ORDER — CEFAZOLIN SODIUM-DEXTROSE 2-4 GM/100ML-% IV SOLN: 2.0000 g | Freq: Four times a day (QID) | INTRAVENOUS | Status: DC

## 2016-11-23 MED ORDER — ACETAMINOPHEN 325 MG PO TABS: 650.0000 mg | ORAL_TABLET | ORAL | Status: DC | PRN

## 2016-11-23 MED ORDER — ONDANSETRON HCL 4 MG/2ML IJ SOLN
INTRAMUSCULAR | Status: AC
  Filled 2016-11-23: qty 2

## 2016-11-23 MED ORDER — ASPIRIN EC 325 MG PO TBEC
325.0000 mg | DELAYED_RELEASE_TABLET | Freq: Every day | ORAL | Status: DC
  Administered 2016-11-24: 325 mg via ORAL
  Filled 2016-11-23: qty 1

## 2016-11-23 MED ORDER — SODIUM CHLORIDE 0.9 % IV SOLN
INTRAVENOUS | Status: DC | PRN
  Administered 2016-11-23: 25 ug/min via INTRAVENOUS

## 2016-11-23 MED ORDER — LIDOCAINE HCL (CARDIAC) 20 MG/ML IV SOLN
INTRAVENOUS | Status: DC | PRN
  Administered 2016-11-23: 60 mg via INTRAVENOUS

## 2016-11-23 MED ORDER — ONDANSETRON HCL 4 MG PO TABS: 4.0000 mg | ORAL_TABLET | Freq: Four times a day (QID) | ORAL | Status: DC | PRN

## 2016-11-23 MED ORDER — FENTANYL CITRATE (PF) 100 MCG/2ML IJ SOLN
INTRAMUSCULAR | Status: AC
  Filled 2016-11-23: qty 2

## 2016-11-23 MED ORDER — METOCLOPRAMIDE HCL 5 MG/ML IJ SOLN: 5.0000 mg | Freq: Three times a day (TID) | INTRAMUSCULAR | Status: DC | PRN

## 2016-11-23 MED ORDER — ACETAMINOPHEN 500 MG PO TABS
1000.0000 mg | ORAL_TABLET | Freq: Three times a day (TID) | ORAL | Status: AC
  Administered 2016-11-23 – 2016-11-24 (×3): 1000 mg via ORAL
  Filled 2016-11-23 (×3): qty 2

## 2016-11-23 MED ORDER — ADULT MULTIVITAMIN W/MINERALS CH
1.0000 | ORAL_TABLET | Freq: Every day | ORAL | Status: DC
  Administered 2016-11-24: 1 via ORAL
  Filled 2016-11-23: qty 1

## 2016-11-23 MED ORDER — MIDAZOLAM HCL 2 MG/2ML IJ SOLN
INTRAMUSCULAR | Status: AC
  Filled 2016-11-23: qty 2

## 2016-11-23 MED ORDER — LIDOCAINE HCL (PF) 1 % IJ SOLN
INTRAMUSCULAR | Status: AC
  Filled 2016-11-23: qty 5

## 2016-11-23 MED ORDER — PROPOFOL 10 MG/ML IV BOLUS
INTRAVENOUS | Status: AC
  Filled 2016-11-23: qty 20

## 2016-11-23 MED ORDER — MIDAZOLAM HCL 2 MG/2ML IJ SOLN
INTRAMUSCULAR | Status: AC
  Administered 2016-11-23: 1 mg via INTRAVENOUS
  Filled 2016-11-23: qty 2

## 2016-11-23 MED ORDER — PANTOPRAZOLE SODIUM 40 MG PO TBEC
40.0000 mg | DELAYED_RELEASE_TABLET | Freq: Every day | ORAL | Status: DC
  Administered 2016-11-23 – 2016-11-24 (×2): 40 mg via ORAL
  Filled 2016-11-23 (×2): qty 1

## 2016-11-23 MED ORDER — MENTHOL 3 MG MT LOZG
1.0000 | LOZENGE | OROMUCOSAL | Status: DC | PRN
  Filled 2016-11-23: qty 9

## 2016-11-23 MED ORDER — LIDOCAINE-EPINEPHRINE (PF) 1 %-1:200000 IJ SOLN
INTRAMUSCULAR | Status: AC
  Filled 2016-11-23: qty 30

## 2016-11-23 MED ORDER — ALUM & MAG HYDROXIDE-SIMETH 200-200-20 MG/5ML PO SUSP: 30.0000 mL | ORAL | Status: DC | PRN

## 2016-11-23 MED ORDER — FENTANYL CITRATE (PF) 100 MCG/2ML IJ SOLN
50.0000 ug | Freq: Once | INTRAMUSCULAR | Status: AC
  Administered 2016-11-23: 50 ug via INTRAVENOUS

## 2016-11-23 MED ORDER — NEOMYCIN-POLYMYXIN B GU 40-200000 IR SOLN
Status: DC | PRN
  Administered 2016-11-23: 16 mL

## 2016-11-23 MED ORDER — MORPHINE SULFATE (PF) 2 MG/ML IV SOLN
2.0000 mg | INTRAVENOUS | Status: DC | PRN
  Administered 2016-11-24: 2 mg via INTRAVENOUS
  Filled 2016-11-23: qty 1

## 2016-11-23 MED ORDER — CALCIUM CITRATE-VITAMIN D 315-200 MG-UNIT PO TABS: 1.0000 | ORAL_TABLET | Freq: Every day | ORAL | Status: DC

## 2016-11-23 MED ORDER — PHENOL 1.4 % MT LIQD
1.0000 | OROMUCOSAL | Status: DC | PRN
  Filled 2016-11-23: qty 177

## 2016-11-23 MED ORDER — SODIUM CHLORIDE 0.9 % IV SOLN
INTRAVENOUS | Status: DC
  Administered 2016-11-23: 22:00:00 via INTRAVENOUS

## 2016-11-23 MED ORDER — ROPIVACAINE HCL 5 MG/ML IJ SOLN
INTRAMUSCULAR | Status: AC
  Filled 2016-11-23: qty 30

## 2016-11-23 MED ORDER — SUGAMMADEX SODIUM 200 MG/2ML IV SOLN
INTRAVENOUS | Status: DC | PRN
Start: 2016-11-23 — End: 2016-11-23
  Administered 2016-11-23: 180 mg via INTRAVENOUS

## 2016-11-23 MED ORDER — SENNOSIDES-DOCUSATE SODIUM 8.6-50 MG PO TABS: 1.0000 | ORAL_TABLET | Freq: Every evening | ORAL | Status: DC | PRN

## 2016-11-23 MED ORDER — FENTANYL CITRATE (PF) 100 MCG/2ML IJ SOLN
25.0000 ug | INTRAMUSCULAR | Status: DC | PRN
  Administered 2016-11-23 (×2): 50 ug via INTRAVENOUS

## 2016-11-23 MED ORDER — ONDANSETRON HCL 4 MG/2ML IJ SOLN
INTRAMUSCULAR | Status: DC | PRN
  Administered 2016-11-23: 4 mg via INTRAVENOUS

## 2016-11-23 MED ORDER — CEFAZOLIN SODIUM-DEXTROSE 2-4 GM/100ML-% IV SOLN
INTRAVENOUS | Status: AC
  Filled 2016-11-23: qty 100

## 2016-11-23 MED ORDER — CEFAZOLIN SODIUM-DEXTROSE 2-3 GM-%(50ML) IV SOLR
INTRAVENOUS | Status: DC | PRN
  Administered 2016-11-23 (×2): 2 g via INTRAVENOUS

## 2016-11-23 MED ORDER — ROPIVACAINE HCL 5 MG/ML IJ SOLN
INTRAMUSCULAR | Status: DC | PRN
  Administered 2016-11-23: 10 mL via PERINEURAL
  Administered 2016-11-23: 20 mL via PERINEURAL

## 2016-11-23 MED ORDER — MIRTAZAPINE 15 MG PO TABS
30.0000 mg | ORAL_TABLET | Freq: Every day | ORAL | Status: DC
Start: 2016-11-23 — End: 2016-11-25
  Administered 2016-11-23: 30 mg via ORAL
  Filled 2016-11-23: qty 2

## 2016-11-23 MED ORDER — LACTATED RINGERS IV SOLN
INTRAVENOUS | Status: DC
  Administered 2016-11-23 (×4): via INTRAVENOUS

## 2016-11-23 MED ORDER — OXYCODONE HCL 5 MG PO TABS
5.0000 mg | ORAL_TABLET | ORAL | Status: DC | PRN
  Administered 2016-11-23: 5 mg via ORAL
  Administered 2016-11-24 (×2): 10 mg via ORAL
  Filled 2016-11-23: qty 2
  Filled 2016-11-23: qty 1
  Filled 2016-11-23: qty 2

## 2016-11-23 MED ORDER — FENTANYL CITRATE (PF) 100 MCG/2ML IJ SOLN
INTRAMUSCULAR | Status: AC
  Administered 2016-11-23: 50 ug via INTRAVENOUS
  Filled 2016-11-23: qty 2

## 2016-11-23 MED ORDER — LIDOCAINE HCL (PF) 1 % IJ SOLN
INTRAMUSCULAR | Status: DC | PRN
  Administered 2016-11-23: 1 mL via INTRADERMAL

## 2016-11-23 MED ORDER — ACETAMINOPHEN 650 MG RE SUPP: 650.0000 mg | RECTAL | Status: DC | PRN

## 2016-11-23 MED ORDER — CALCIUM CARBONATE-VITAMIN D 500-200 MG-UNIT PO TABS
1.0000 | ORAL_TABLET | Freq: Every day | ORAL | Status: DC
  Administered 2016-11-24: 09:00:00 1 via ORAL
  Filled 2016-11-23: qty 1

## 2016-11-23 MED ORDER — PHENYLEPHRINE HCL 10 MG/ML IJ SOLN
INTRAMUSCULAR | Status: DC | PRN
  Administered 2016-11-23 (×6): 100 ug via INTRAVENOUS
  Administered 2016-11-23: 200 ug via INTRAVENOUS
  Administered 2016-11-23: 100 ug via INTRAVENOUS
  Administered 2016-11-23: 200 ug via INTRAVENOUS
  Administered 2016-11-23 (×3): 100 ug via INTRAVENOUS
  Administered 2016-11-23 (×2): 200 ug via INTRAVENOUS

## 2016-11-23 MED ORDER — NEOMYCIN-POLYMYXIN B GU 40-200000 IR SOLN
Status: AC
  Filled 2016-11-23: qty 20

## 2016-11-23 MED ORDER — DEXTROSE 5 % IV SOLN
2.0000 g | Freq: Four times a day (QID) | INTRAVENOUS | Status: AC
  Administered 2016-11-23 – 2016-11-24 (×3): 2 g via INTRAVENOUS
  Filled 2016-11-23 (×3): qty 2000

## 2016-11-23 MED ORDER — BUPIVACAINE-EPINEPHRINE (PF) 0.5% -1:200000 IJ SOLN
INTRAMUSCULAR | Status: AC
  Filled 2016-11-23: qty 30

## 2016-11-23 MED ORDER — FENTANYL CITRATE (PF) 100 MCG/2ML IJ SOLN
INTRAMUSCULAR | Status: DC | PRN
  Administered 2016-11-23: 50 ug via INTRAVENOUS
  Administered 2016-11-23 (×2): 25 ug via INTRAVENOUS
  Administered 2016-11-23 (×2): 50 ug via INTRAVENOUS

## 2016-11-23 MED ORDER — OXYCODONE HCL 5 MG/5ML PO SOLN: 5.0000 mg | Freq: Once | ORAL | Status: DC | PRN

## 2016-11-23 MED ORDER — BUPIVACAINE-EPINEPHRINE (PF) 0.25% -1:200000 IJ SOLN
INTRAMUSCULAR | Status: AC
  Filled 2016-11-23: qty 30

## 2016-11-23 MED ORDER — DOCUSATE SODIUM 100 MG PO CAPS
100.0000 mg | ORAL_CAPSULE | Freq: Two times a day (BID) | ORAL | Status: DC
  Administered 2016-11-23 – 2016-11-24 (×2): 100 mg via ORAL
  Filled 2016-11-23 (×2): qty 1

## 2016-11-23 MED ORDER — BUSPIRONE HCL 10 MG PO TABS
10.0000 mg | ORAL_TABLET | Freq: Two times a day (BID) | ORAL | Status: DC
  Administered 2016-11-23 – 2016-11-24 (×2): 10 mg via ORAL
  Filled 2016-11-23 (×2): qty 1

## 2016-11-23 MED ORDER — BISACODYL 10 MG RE SUPP: 10.0000 mg | Freq: Every day | RECTAL | Status: DC | PRN

## 2016-11-23 MED ORDER — MIDAZOLAM HCL 5 MG/5ML IJ SOLN
INTRAMUSCULAR | Status: DC | PRN
  Administered 2016-11-23 (×2): 1 mg via INTRAVENOUS

## 2016-11-23 MED ORDER — SUCCINYLCHOLINE CHLORIDE 20 MG/ML IJ SOLN
INTRAMUSCULAR | Status: AC
  Filled 2016-11-23: qty 1

## 2016-11-23 MED ORDER — OXYCODONE HCL 5 MG PO TABS: 5.0000 mg | ORAL_TABLET | Freq: Once | ORAL | Status: DC | PRN

## 2016-11-23 MED ORDER — SEVOFLURANE IN SOLN
RESPIRATORY_TRACT | Status: AC
  Filled 2016-11-23: qty 250

## 2016-11-23 MED ORDER — SIMVASTATIN 20 MG PO TABS
20.0000 mg | ORAL_TABLET | Freq: Every day | ORAL | Status: DC
  Administered 2016-11-23: 20 mg via ORAL
  Filled 2016-11-23: qty 1

## 2016-11-23 MED ORDER — METHOCARBAMOL 500 MG PO TABS
500.0000 mg | ORAL_TABLET | Freq: Four times a day (QID) | ORAL | Status: DC | PRN
  Administered 2016-11-23 – 2016-11-24 (×2): 500 mg via ORAL
  Filled 2016-11-23 (×2): qty 1

## 2016-11-23 MED ORDER — DIPHENHYDRAMINE HCL 12.5 MG/5ML PO ELIX: 12.5000 mg | ORAL_SOLUTION | ORAL | Status: DC | PRN

## 2016-11-23 MED ORDER — METHOCARBAMOL 1000 MG/10ML IJ SOLN: 500.0000 mg | Freq: Four times a day (QID) | INTRAMUSCULAR | Status: DC | PRN

## 2016-11-23 MED ORDER — ROCURONIUM BROMIDE 50 MG/5ML IV SOLN
INTRAVENOUS | Status: AC
  Filled 2016-11-23: qty 1

## 2016-11-23 MED ORDER — SUCCINYLCHOLINE CHLORIDE 20 MG/ML IJ SOLN
INTRAMUSCULAR | Status: DC | PRN
  Administered 2016-11-23: 100 mg via INTRAVENOUS

## 2016-11-23 SURGICAL SUPPLY — 106 items
BANDAGE ACE 4X5 VEL STRL LF (GAUZE/BANDAGES/DRESSINGS) ×6 IMPLANT
BIT DRILL 2.5 (BIT) ×1
BIT DRILL 3.1 (BIT) ×3 IMPLANT
BIT DRILL SHRT 216X2.5XNS LF (BIT) ×2 IMPLANT
BIT DRL SHRT 216X2.5XNS LF (BIT) ×2
BLADE SAGITTAL WIDE XTHICK NO (BLADE) ×3 IMPLANT
BNDG COHESIVE 4X5 TAN STRL (GAUZE/BANDAGES/DRESSINGS) ×3 IMPLANT
BOWL CEMENT MIX W/ADAPTER (MISCELLANEOUS) IMPLANT
CANISTER SUCT 1200ML W/VALVE (MISCELLANEOUS) ×3 IMPLANT
CANISTER SUCT 3000ML PPV (MISCELLANEOUS) IMPLANT
CHLORAPREP W/TINT 26ML (MISCELLANEOUS) ×6 IMPLANT
COOLER POLAR GLACIER W/PUMP (MISCELLANEOUS) ×3 IMPLANT
COUNTER NEEDLE 20/40 LG (NEEDLE) ×3 IMPLANT
DRAPE C-ARM XRAY 36X54 (DRAPES) ×3 IMPLANT
DRAPE IMP U-DRAPE 54X76 (DRAPES) ×6 IMPLANT
DRAPE INCISE IOBAN 66X45 STRL (DRAPES) ×6 IMPLANT
DRAPE SHEET LG 3/4 BI-LAMINATE (DRAPES) ×6 IMPLANT
DRAPE TABLE BACK 80X90 (DRAPES) ×3 IMPLANT
DRAPE U-SHAPE 47X51 STRL (DRAPES) ×6 IMPLANT
DRSG OPSITE POSTOP 4X10 (GAUZE/BANDAGES/DRESSINGS) IMPLANT
DRSG OPSITE POSTOP 4X8 (GAUZE/BANDAGES/DRESSINGS) IMPLANT
ELECT CAUTERY BLADE 6.4 (BLADE) ×3 IMPLANT
ELECT REM PT RETURN 9FT ADLT (ELECTROSURGICAL) ×3
ELECTRODE REM PT RTRN 9FT ADLT (ELECTROSURGICAL) ×2 IMPLANT
EVACUATOR 1/8 PVC DRAIN (DRAIN) ×3 IMPLANT
GAUZE PACK 2X3YD (MISCELLANEOUS) ×3 IMPLANT
GAUZE PETRO XEROFOAM 1X8 (MISCELLANEOUS) ×3 IMPLANT
GAUZE SPONGE 4X4 12PLY STRL (GAUZE/BANDAGES/DRESSINGS) ×3 IMPLANT
GLOVE BIO SURGEON STRL SZ7.5 (GLOVE) ×18 IMPLANT
GLOVE BIO SURGEON STRL SZ8 (GLOVE) ×6 IMPLANT
GLOVE BIOGEL PI IND STRL 7.5 (GLOVE) ×8 IMPLANT
GLOVE BIOGEL PI IND STRL 8 (GLOVE) ×2 IMPLANT
GLOVE BIOGEL PI INDICATOR 7.5 (GLOVE) ×4
GLOVE BIOGEL PI INDICATOR 8 (GLOVE) ×1
GLOVE INDICATOR 8.0 STRL GRN (GLOVE) ×3 IMPLANT
GLOVE SURG ORTHO 8.0 STRL STRW (GLOVE) ×9 IMPLANT
GOWN STRL REUS W/ TWL LRG LVL3 (GOWN DISPOSABLE) ×8 IMPLANT
GOWN STRL REUS W/ TWL XL LVL3 (GOWN DISPOSABLE) ×2 IMPLANT
GOWN STRL REUS W/TWL LRG LVL3 (GOWN DISPOSABLE) ×4
GOWN STRL REUS W/TWL XL LVL3 (GOWN DISPOSABLE) ×1
HANDLE YANKAUER SUCT BULB TIP (MISCELLANEOUS) ×3 IMPLANT
HOOD PEEL AWAY FLYTE STAYCOOL (MISCELLANEOUS) IMPLANT
HYDROSET GAMMA ×3 IMPLANT
IV NS 100ML SINGLE PACK (IV SOLUTION) ×3 IMPLANT
K-WIRE SMOOTH 2.0X150 (WIRE) ×12
KIT RM TURNOVER STRD PROC AR (KITS) ×3 IMPLANT
KIT STABILIZATION SHOULDER (MISCELLANEOUS) ×3 IMPLANT
KWIRE SMOOTH 2.0X150 (WIRE) ×8 IMPLANT
MARKER SKIN DUAL TIP RULER LAB (MISCELLANEOUS) ×3 IMPLANT
MASK FACE SPIDER DISP (MASK) ×3 IMPLANT
NDL FRENCH SPRG EYE 1/2 CRC (NEEDLE) ×3 IMPLANT
NEEDLE 18GX1X1/2 (RX/OR ONLY) (NEEDLE) ×3 IMPLANT
NEEDLE HYPO 25X1 1.5 SAFETY (NEEDLE) ×3 IMPLANT
NEEDLE MAYO 6 CRC TAPER PT (NEEDLE) ×12 IMPLANT
NEEDLE REVERSE CUT 1/2 CRC (NEEDLE) ×6 IMPLANT
NEEDLE SPNL 20GX3.5 QUINCKE YW (NEEDLE) ×3 IMPLANT
NS IRRIG 1000ML POUR BTL (IV SOLUTION) ×3 IMPLANT
NS IRRIG 500ML POUR BTL (IV SOLUTION) ×3 IMPLANT
PACK ARTHROSCOPY SHOULDER (MISCELLANEOUS) ×3 IMPLANT
PAD CAST CTTN 4X4 STRL (SOFTGOODS) ×4 IMPLANT
PAD WRAPON POLAR SHDR UNIV (MISCELLANEOUS) ×2 IMPLANT
PADDING CAST COTTON 4X4 STRL (SOFTGOODS) ×2
PLATE PROX HUMERUS 4H (Plate) ×3 IMPLANT
PULSAVAC PLUS IRRIG FAN TIP (DISPOSABLE)
PUTTY DBX 5CC (Putty) ×3 IMPLANT
SCREW BONE CORT 3.5X24MM (Screw) ×9 IMPLANT
SCREW BONE LOCK 4.0X20MM (Screw) ×3 IMPLANT
SCREW BONE LOCK 4.0X22MM (Screw) ×3 IMPLANT
SCREW BONE LOCK 4.0X24MM (Screw) ×3 IMPLANT
SCREW BONE LOCK 4.0X26MM (Screw) ×3 IMPLANT
SCREW BONE LOCKING 4.0X42MM (Screw) ×3 IMPLANT
SCREW CANC 4.0X30 (Screw) ×6 IMPLANT
SCREW CANCELLOUS 4.0MMX48MM (Screw) ×3 IMPLANT
SCREW CORT 28X3.5XST LCK NS (Screw) ×2 IMPLANT
SCREW CORTICAL 3.5MMX26MM (Screw) ×3 IMPLANT
SCREW CORTICAL 3.5MMX42MM (Screw) ×3 IMPLANT
SCREW CORTICAL 3.5X28MM (Screw) ×1 IMPLANT
SCREW LOCK 34X4XSTNS TI (Screw) ×2 IMPLANT
SCREW LOCKING 4.0MMX40MM (Screw) ×3 IMPLANT
SCREW LOCKING 4.0X34MM (Screw) ×1 IMPLANT
SCREW LOCKING 4.0X44MM (Screw) ×3 IMPLANT
SCREW LOCKING 4.0X46MM (Screw) ×3 IMPLANT
SLING ULTRA II LG (MISCELLANEOUS) ×3 IMPLANT
SLING ULTRA II M (MISCELLANEOUS) IMPLANT
SOL .9 NS 3000ML IRR  AL (IV SOLUTION) ×1
SOL .9 NS 3000ML IRR UROMATIC (IV SOLUTION) ×2 IMPLANT
SPONGE LAP 18X18 5 PK (GAUZE/BANDAGES/DRESSINGS) ×9 IMPLANT
STAPLER SKIN PROX 35W (STAPLE) ×3 IMPLANT
STOCKINETTE IMPERVIOUS 9X36 MD (GAUZE/BANDAGES/DRESSINGS) ×3 IMPLANT
STRAP SAFETY BODY (MISCELLANEOUS) ×3 IMPLANT
SUT BONE WAX W31G (SUTURE) ×3 IMPLANT
SUT ETHIBOND #5 BRAIDED 30INL (SUTURE) ×6 IMPLANT
SUT ETHIBOND 0 MO6 C/R (SUTURE) IMPLANT
SUT FIBERWIRE #2 38 BLUE 1/2 (SUTURE) ×30
SUT PROLENE 4 0 PS 2 18 (SUTURE) IMPLANT
SUT TICRON 2-0 30IN 311381 (SUTURE) ×12 IMPLANT
SUT VIC AB 0 CT1 36 (SUTURE) ×3 IMPLANT
SUT VIC AB 2-0 CT1 27 (SUTURE) ×3
SUT VIC AB 2-0 CT1 TAPERPNT 27 (SUTURE) ×6 IMPLANT
SUT VIC AB 2-0 CT2 27 (SUTURE) ×3 IMPLANT
SUTURE FIBERWR #2 38 BLUE 1/2 (SUTURE) ×20 IMPLANT
SYR 30ML LL (SYRINGE) ×3 IMPLANT
SYRINGE 10CC LL (SYRINGE) ×3 IMPLANT
SYRINGE IRR TOOMEY STRL 70CC (SYRINGE) ×3 IMPLANT
TIP FAN IRRIG PULSAVAC PLUS (DISPOSABLE) IMPLANT
WRAPON POLAR PAD SHDR UNIV (MISCELLANEOUS) ×3

## 2016-11-23 NOTE — Anesthesia Preprocedure Evaluation (Signed)
Anesthesia Evaluation  Patient identified by MRN, date of birth, ID band Patient awake    Reviewed: Allergy & Precautions, H&P , NPO status , Patient's Chart, lab work & pertinent test results  History of Anesthesia Complications Negative for: history of anesthetic complications  Airway Mallampati: III  TM Distance: <3 FB Neck ROM: limited    Dental  (+) Chipped, Poor Dentition, Missing, Caps   Pulmonary neg shortness of breath, Current Smoker,           Cardiovascular Exercise Tolerance: Good (-) angina(-) Past MI and (-) DOE negative cardio ROS       Neuro/Psych PSYCHIATRIC DISORDERS negative neurological ROS     GI/Hepatic Neg liver ROS, GERD  Medicated and Controlled,  Endo/Other  negative endocrine ROS  Renal/GU      Musculoskeletal   Abdominal   Peds  Hematology negative hematology ROS (+)   Anesthesia Other Findings Past Medical History: 1997: Breast cancer ([place])     Comment:  right breast, radiation No date: Bronchitis     Comment:  recent/ 06/09/15 [facility] x[facility]/ [facility]               care/resolved 1997: Cancer [facility])     Comment:  right lumpectomy,L/Ad/R  No date: GERD (gastroesophageal reflux disease) No date: Hypercholesteremia No date: Hyperlipidemia No date: Panic attack No date: Personal history of malignant neoplasm of breast  Past Surgical History: 1997: BREAST EXCISIONAL BIOPSY; Right     Comment:  pos 1997: BREAST SURGERY; Right     Comment:  lumpectomy 08/06/2016: CHOLECYSTECTOMY; N/A     Comment:  Procedure: LAPAROSCOPIC CHOLECYSTECTOMY WITH               INTRAOPERATIVE CHOLANGIOGRAM;  Surgeon: Christene Lye, MD;  Location: [facility] ORS;  Service:               General;  Laterality: N/A; 2008: COLONOSCOPY 07/21/2015: COLONOSCOPY WITH PROPOFOL; N/A     Comment:  Procedure: COLONOSCOPY WITH PROPOFOL;  Surgeon: Lucilla Lame, MD;  Location:  [place];  Service:               Endoscopy;  Laterality: N/A; 06/16/2016: ESOPHAGOGASTRODUODENOSCOPY (EGD) WITH PROPOFOL; N/A     Comment:  Procedure: ESOPHAGOGASTRODUODENOSCOPY (EGD) WITH               PROPOFOL;  Surgeon: Christene Lye, MD;                Location: [facility] END[facility];  Service: Endoscopy;                Laterality: N/A;  BMI    Body Mass Index:  29.95 kg/m      Reproductive/Obstetrics negative OB ROS                             Anesthesia Physical Anesthesia Plan  ASA: III  Anesthesia Plan: General ETT   Post-op Pain Management: GA combined w/ Regional for post-op pain   Induction: Intravenous  PONV Risk Score and Plan: 3 and Ondansetron, Dexamethasone, Midazolam and Treatment may vary due to age or medical condition  Airway Management Planned: Oral ETT  Additional Equipment:   Intra-op Plan:   Post-operative Plan: Extubation in OR  Informed Consent: I have  reviewed the patients History and Physical, chart, labs and discussed the procedure including the risks, benefits and alternatives for the proposed anesthesia with the patient or authorized representative who has indicated his/her understanding and acceptance.   Dental Advisory Given  Plan Discussed with: Anesthesiologist, CRNA and Surgeon  Anesthesia Plan Comments: (Patient consented for risks of anesthesia including but not limited to:  - adverse reactions to medications - damage to teeth, lips or other oral mucosa - sore throat or hoarseness - Damage to heart, brain, lungs or loss of life  Patient voiced understanding.)        Anesthesia Quick Evaluation

## 2016-11-23 NOTE — Transfer of Care (Signed)
Immediate Anesthesia Transfer of Care Note  Patient: Chelsey [name]  Procedure(s) Performed: OPEN REDUCTION INTERNAL FIXATION (ORIF) PROXIMAL HUMERUS FRACTURE (Left Arm Upper) BICEPS TENODESIS (Left Arm Upper)  Patient Location: PACU  Anesthesia Type:GA combined with regional for post-op pain  Level of Consciousness: awake  Airway & Oxygen Therapy: Patient Spontanous Breathing and Patient connected to face mask oxygen  Post-op Assessment: Report given to RN and Post -op Vital signs reviewed and stable  Post vital signs: Reviewed  Last Vitals:  Vitals:   11/23/16 1225 11/23/16 2021  BP: 109/72 137/69  Pulse: 67 83  Resp: 12 12  Temp:  (!) 36.3 C  SpO2: 99% 100%    Last Pain:  Vitals:   11/23/16 1215  TempSrc:   PainSc: 4          Complications: No apparent anesthesia complications

## 2016-11-23 NOTE — Anesthesia Procedure Notes (Signed)
Anesthesia Regional Block: Interscalene brachial plexus block   Pre-Anesthetic Checklist: ,, timeout performed, Correct Patient, Correct Site, Correct Laterality, Correct Procedure, Correct Position, site marked, Risks and benefits discussed,  Surgical consent,  Pre-op evaluation,  At surgeon's request and post-op pain management  Laterality: Upper and Left  Prep: chloraprep       Needles:  Injection technique: Single-shot  Needle Type: Stimiplex     Needle Length: 5cm  Needle Gauge: 22     Additional Needles:   Narrative:  Start time: 11/23/2016 12:00 PM End time: 11/23/2016 12:04 PM Injection made incrementally with aspirations every 5 mL.  Performed by: Personally  Anesthesiologist: Katy Fitch K  Additional Notes: Functioning IV was confirmed and monitors were applied.  A 75mm 22ga Stimuplex needle was used. Sterile prep,hand hygiene and sterile gloves were used.  Minimal sedation used for procedure.  No paresthesia endorsed by patient during the procedure.  Negative aspiration and negative test dose prior to incremental administration of local anesthetic. The patient tolerated the procedure well with no immediate complications.

## 2016-11-23 NOTE — Anesthesia Post-op Follow-up Note (Signed)
Anesthesia QCDR form completed.        

## 2016-11-23 NOTE — Anesthesia Procedure Notes (Signed)
Procedure Name: Intubation Date/Time: 11/23/2016 12:42 PM Performed by: Dionne Bucy Pre-anesthesia Checklist: Patient identified, Patient being monitored, Timeout performed, Emergency Drugs available and Suction available Patient Re-evaluated:Patient Re-evaluated prior to induction Oxygen Delivery Method: Circle system utilized Preoxygenation: Pre-oxygenation with 100% oxygen Induction Type: IV induction Ventilation: Mask ventilation without difficulty Laryngoscope Size: Mac and 3 Grade View: Grade II Tube type: Oral Tube size: 7.0 mm Number of attempts: 1 Airway Equipment and Method: Stylet Placement Confirmation: ETT inserted through vocal cords under direct vision,  positive ETCO2 and breath sounds checked- equal and bilateral Secured at: 21 cm Tube secured with: Tape Dental Injury: Teeth and Oropharynx as per pre-operative assessment

## 2016-11-23 NOTE — OR Nursing (Signed)
Patient is in a immobilizer for [place]

## 2016-11-24 ENCOUNTER — Encounter: Payer: Self-pay | Admitting: Orthopedic Surgery

## 2016-11-24 DIAGNOSIS — W010XXA Fall on same level from slipping, tripping and stumbling without subsequent striking against object, initial encounter: Secondary | ICD-10-CM | POA: Diagnosis present

## 2016-11-24 DIAGNOSIS — S42232A 3-part fracture of surgical neck of left humerus, initial encounter for closed fracture: Secondary | ICD-10-CM | POA: Diagnosis present

## 2016-11-24 DIAGNOSIS — Z9049 Acquired absence of other specified parts of digestive tract: Secondary | ICD-10-CM | POA: Diagnosis not present

## 2016-11-24 DIAGNOSIS — M810 Age-related osteoporosis without current pathological fracture: Secondary | ICD-10-CM | POA: Diagnosis present

## 2016-11-24 DIAGNOSIS — Z811 Family history of alcohol abuse and dependence: Secondary | ICD-10-CM | POA: Diagnosis not present

## 2016-11-24 DIAGNOSIS — E7800 Pure hypercholesterolemia, unspecified: Secondary | ICD-10-CM | POA: Diagnosis present

## 2016-11-24 DIAGNOSIS — Z7982 Long term (current) use of aspirin: Secondary | ICD-10-CM | POA: Diagnosis not present

## 2016-11-24 DIAGNOSIS — Z79891 Long term (current) use of opiate analgesic: Secondary | ICD-10-CM | POA: Diagnosis not present

## 2016-11-24 DIAGNOSIS — Z853 Personal history of malignant neoplasm of breast: Secondary | ICD-10-CM | POA: Diagnosis not present

## 2016-11-24 DIAGNOSIS — S42212A Unspecified displaced fracture of surgical neck of left humerus, initial encounter for closed fracture: Secondary | ICD-10-CM | POA: Diagnosis present

## 2016-11-24 DIAGNOSIS — Y92018 Other place in single-family (private) house as the place of occurrence of the external cause: Secondary | ICD-10-CM | POA: Diagnosis not present

## 2016-11-24 DIAGNOSIS — K219 Gastro-esophageal reflux disease without esophagitis: Secondary | ICD-10-CM | POA: Diagnosis present

## 2016-11-24 DIAGNOSIS — Z841 Family history of disorders of kidney and ureter: Secondary | ICD-10-CM | POA: Diagnosis not present

## 2016-11-24 DIAGNOSIS — Y9301 Activity, walking, marching and hiking: Secondary | ICD-10-CM | POA: Diagnosis present

## 2016-11-24 DIAGNOSIS — Z7983 Long term (current) use of bisphosphonates: Secondary | ICD-10-CM | POA: Diagnosis not present

## 2016-11-24 DIAGNOSIS — D62 Acute posthemorrhagic anemia: Secondary | ICD-10-CM | POA: Diagnosis not present

## 2016-11-24 DIAGNOSIS — Y990 Civilian activity done for income or pay: Secondary | ICD-10-CM | POA: Diagnosis not present

## 2016-11-24 DIAGNOSIS — Z803 Family history of malignant neoplasm of breast: Secondary | ICD-10-CM | POA: Diagnosis not present

## 2016-11-24 DIAGNOSIS — E785 Hyperlipidemia, unspecified: Secondary | ICD-10-CM | POA: Diagnosis present

## 2016-11-24 LAB — CBC
HEMATOCRIT: 29.9 % — AB (ref 35.0–47.0)
Hemoglobin: 10.2 g/dL — ABNORMAL LOW (ref 12.0–16.0)
MCH: 31.9 pg (ref 26.0–34.0)
MCHC: 34.1 g/dL (ref 32.0–36.0)
MCV: 93.4 fL (ref 80.0–100.0)
Platelets: 350 10*3/uL (ref 150–440)
RBC: 3.2 MIL/uL — ABNORMAL LOW (ref 3.80–5.20)
RDW: 13 % (ref 11.5–14.5)
WBC: 8.9 10*3/uL (ref 3.6–11.0)

## 2016-11-24 LAB — BASIC METABOLIC PANEL
Anion gap: 5 (ref 5–15)
BUN: 12 mg/dL (ref 6–20)
CHLORIDE: 102 mmol/L (ref 101–111)
CO2: 27 mmol/L (ref 22–32)
CREATININE: 0.75 mg/dL (ref 0.44–1.00)
Calcium: 8.1 mg/dL — ABNORMAL LOW (ref 8.9–10.3)
GFR calc non Af Amer: >60 mL/min (ref >60)
Glucose, Bld: 124 mg/dL — ABNORMAL HIGH (ref 65–99)
POTASSIUM: 4.2 mmol/L (ref 3.5–5.1)
Sodium: 134 mmol/L — ABNORMAL LOW (ref 135–145)

## 2016-11-24 MED ORDER — ASPIRIN 325 MG PO TBEC: 325.0000 mg | DELAYED_RELEASE_TABLET | Freq: Every day | ORAL | 0 refills | Status: DC

## 2016-11-24 MED ORDER — OXYCODONE HCL 5 MG PO TABS: 5.0000 mg | ORAL_TABLET | ORAL | 0 refills | Status: DC | PRN

## 2016-11-24 NOTE — Discharge Summary (Signed)
Physician Discharge Summary  Patient ID: Chelsey [name] MRN: [id number] DOB/AGE: 07-28-1950 67 y.o.  Admit date: 11/23/2016 Discharge date: 11/24/2016  Admission Diagnoses:  Proximal humerus fracture [S42.209A]   Discharge Diagnoses: Patient Active Problem List   Diagnosis Date Noted  . S/P ORIF (open reduction internal fixation) fracture 11/23/2016  . Proximal humerus fracture 11/23/2016  . Panic attack 08/17/2016  . Abdominal pain 05/21/2016  . Special screening for malignant neoplasms, colon   . Benign neoplasm of descending colon   . Benign neoplasm of sigmoid colon   . Gastroesophageal reflux disease without esophagitis 06/13/2014  . H/O hypercholesterolemia 06/13/2014  . Depression, major, recurrent, moderate ([place]) 06/13/2014  . History of breast cancer 03/12/2013    Past Medical History:  Diagnosis Date  . Breast cancer ([place]) 1997   right breast, radiation  . Bronchitis    recent/ 06/09/15 had chest xray/ [facility]/resolved  . Cancer [facility]) 1997   right lumpectomy,L/Ad/R   . GERD (gastroesophageal reflux disease)   . Hypercholesteremia   . Hyperlipidemia   . Panic attack   . Personal history of malignant neoplasm of breast      Transfusion: none   Consultants (if any):   Discharged Condition: Improved  Hospital Course: Chelsey [name] is an 66 y.o. female who was admitted 11/23/2016 with a diagnosis of left proximal humerus fracture and went to the operating room on 11/23/2016 and underwent the above named procedures.    Surgeries: Procedure(s): OPEN REDUCTION INTERNAL FIXATION (ORIF) PROXIMAL HUMERUS FRACTURE BICEPS TENODESIS on 11/23/2016 Patient tolerated the surgery well. Taken to PACU where she was stabilized and then transferred to the orthopedic floor.  Started on aspirin 325 mg daily.  On postop day 1 physical therapy was started.  Patient tolerated PT well.  Pain was well controlled.  Vital signs and labs are stable.  Patient was stable  and ready for discharge to home on postop day 1.  The patient will follow up with Dr. Posey Pronto 2 weeks postop and start outpatient physical therapy around 2 weeks postop.    She was given perioperative antibiotics:  Anti-infectives    Start     Dose/Rate Route Frequency Ordered Stop   11/23/16 2200  ceFAZolin (ANCEF) IVPB 2g/100 mL premix  Status:  Discontinued     2 g 200 mL/hr over 30 Minutes Intravenous Every 6 hours 11/23/16 2150 11/23/16 2158   11/23/16 2200  ceFAZolin (ANCEF) 2 g in dextrose 5 % 100 mL IVPB     2 g 200 mL/hr over 30 Minutes Intravenous Every 6 hours 11/23/16 2159 11/24/16 0907   11/23/16 1040  ceFAZolin (ANCEF) 2-4 GM/100ML-% IVPB    Comments:  Otho Darner   : cabinet override      11/23/16 1040 11/23/16 2259    .  She was given sequential compression devices, early ambulation, and aspirin for DVT prophylaxis.  She benefited maximally from the hospital stay and there were no complications.    Recent vital signs:  Vitals:   11/24/16 0009 11/24/16 0820  BP: 128/72 (!) 104/57  Pulse: 94 85  Resp: 18 18  Temp: 97.9 F (36.6 C) 98.6 F (37 C)  SpO2: 97% 100%    Recent laboratory studies:  Lab Results  Component Value Date   HGB 10.2 (L) 11/24/2016   HGB 12.7 11/22/2016   HGB 14.6 08/19/2016   Lab Results  Component Value Date   WBC 8.9 11/24/2016   PLT 350 11/24/2016   Lab Results  Component Value Date   INR 0.94 11/22/2016   Lab Results  Component Value Date   NA 134 (L) 11/24/2016   K 4.2 11/24/2016   CL 102 11/24/2016   CO2 27 11/24/2016   BUN 12 11/24/2016   CREATININE 0.75 11/24/2016   GLUCOSE 124 (H) 11/24/2016    Discharge Medications:   Allergies as of 11/24/2016   No Known Allergies     Medication List    STOP taking these medications   aspirin 81 MG chewable tablet Replaced by:  aspirin 325 MG EC tablet   oxyCODONE-acetaminophen 5-325 MG tablet Commonly known as:  PERCOCET     TAKE these medications    alendronate 70 MG tablet Commonly known as:  FOSAMAX Take 1 tablet (70 mg total) by mouth once a week.   aspirin 325 MG EC tablet Take 1 tablet (325 mg total) by mouth daily. Replaces:  aspirin 81 MG chewable tablet   busPIRone 10 MG tablet Commonly known as:  BUSPAR Take 1 tablet (10 mg total) by mouth 3 (three) times daily. What changed:  when to take this   CALCIUM + D PO Take 1 tablet by mouth daily. am   hydrOXYzine 25 MG tablet Commonly known as:  ATARAX/VISTARIL Take 1 tablet (25 mg total) by mouth daily as needed for anxiety.   mirtazapine 30 MG tablet Commonly known as:  REMERON Take 1 tablet (30 mg total) by mouth at bedtime.   multivitamin tablet Take 1 tablet by mouth daily. am   omeprazole 20 MG capsule Commonly known as:  PRILOSEC Take 1 capsule (20 mg total) by mouth 2 (two) times daily before a meal. What changed:  when to take this   oxyCODONE 5 MG immediate release tablet Commonly known as:  Oxy IR/ROXICODONE Take 1-2 tablets (5-10 mg total) by mouth every 4 (four) hours as needed for moderate pain.   simvastatin 20 MG tablet Commonly known as:  ZOCOR Take 1 tablet (20 mg total) by mouth daily. bedtime What changed:  when to take this  additional instructions   Vitamin D 2000 units tablet Take 2,000 Units by mouth daily.       Diagnostic Studies: Ct Shoulder Left Wo Contrast  Result Date: 11/13/2016 CLINICAL DATA:  Status post fall.  Left shoulder pain. EXAM: CT OF THE UPPER LEFT EXTREMITY WITHOUT CONTRAST TECHNIQUE: Multidetector CT imaging of the upper left extremity was performed according to the standard protocol. COMPARISON:  None. FINDINGS: Bones/Joint/Cartilage Comminuted fracture of the surgical neck of the left proximal humerus with 2 cm of antro lateral displacement. 18 mm fracture fragment is medially displaced by 15 mm. Fracture extends to the greater tuberosity without significant displacement. 3 mm loose body along the superior  glenoid. No aggressive lytic or sclerotic osseous lesion. No other fracture or dislocation. Mild arthropathy of the acromioclavicular joint. Type II acromion. Ligaments Ligaments are suboptimally evaluated by CT. Muscles and Tendons Muscles are normal.  No muscle atrophy. Soft tissue No fluid collection or hematoma. No soft tissue mass. Visualized left lung is clear. Large hiatal hernia. IMPRESSION: 1. Comminuted fracture of the surgical neck of the left proximal humerus with 2 cm of antro lateral displacement. 18 mm fracture fragment is medially displaced by 15 mm. Fracture extends to the greater tuberosity without significant displacement. Electronically Signed   By: Kathreen Devoid   On: 11/13/2016 16:15   Dg Shoulder Left  Result Date: 11/13/2016 CLINICAL DATA:  Fall today.  Left shoulder pain.  Initial  encounter. EXAM: LEFT SHOULDER - 2+ VIEW COMPARISON:  None. FINDINGS: Highly comminuted fracture of the left humeral neck is seen. No evidence of dislocation. No other fractures or bone lesions identified. IMPRESSION: Highly comminuted left humeral neck fracture. Electronically Signed   By: Earle Gell M.D.   On: 11/13/2016 15:28   Dg Humerus Left  Result Date: 11/23/2016 CLINICAL DATA:  ORIF of left humerus fracture. EXAM: LEFT HUMERUS - 2+ VIEW; DG C-ARM 61-120 MIN; DG C-ARM GT 120 MIN COMPARISON:  CT 11/13/2016 FINDINGS: 5 minutes 30 seconds of fluoroscopic time was utilized during ORIF of comminuted left surgical neck fracture. Fixation plate and screws are noted along the lateral aspect of the surgical neck of the humerus and proximal humeral diaphysis. No immediate complications. IMPRESSION: ORIF of proximal left humerus fracture. Electronically Signed   By: Ashley Royalty M.D.   On: 11/23/2016 20:25   Dg C-arm 61-120 Min  Result Date: 11/23/2016 CLINICAL DATA:  ORIF of left humerus fracture. EXAM: LEFT HUMERUS - 2+ VIEW; DG C-ARM 61-120 MIN; DG C-ARM GT 120 MIN COMPARISON:  CT 11/13/2016 FINDINGS:  5 minutes 30 seconds of fluoroscopic time was utilized during ORIF of comminuted left surgical neck fracture. Fixation plate and screws are noted along the lateral aspect of the surgical neck of the humerus and proximal humeral diaphysis. No immediate complications. IMPRESSION: ORIF of proximal left humerus fracture. Electronically Signed   By: Ashley Royalty M.D.   On: 11/23/2016 20:25   Dg C-arm 61-120 Min  Result Date: 11/23/2016 CLINICAL DATA:  ORIF of left humerus fracture. EXAM: LEFT HUMERUS - 2+ VIEW; DG C-ARM 61-120 MIN; DG C-ARM GT 120 MIN COMPARISON:  CT 11/13/2016 FINDINGS: 5 minutes 30 seconds of fluoroscopic time was utilized during ORIF of comminuted left surgical neck fracture. Fixation plate and screws are noted along the lateral aspect of the surgical neck of the humerus and proximal humeral diaphysis. No immediate complications. IMPRESSION: ORIF of proximal left humerus fracture. Electronically Signed   By: Ashley Royalty M.D.   On: 11/23/2016 20:25   Dg C-arm Gt 78 Min  Result Date: 11/23/2016 CLINICAL DATA:  ORIF of left humerus fracture. EXAM: LEFT HUMERUS - 2+ VIEW; DG C-ARM 61-120 MIN; DG C-ARM GT 120 MIN COMPARISON:  CT 11/13/2016 FINDINGS: 5 minutes 30 seconds of fluoroscopic time was utilized during ORIF of comminuted left surgical neck fracture. Fixation plate and screws are noted along the lateral aspect of the surgical neck of the humerus and proximal humeral diaphysis. No immediate complications. IMPRESSION: ORIF of proximal left humerus fracture. Electronically Signed   By: Ashley Royalty M.D.   On: 11/23/2016 20:25    Disposition: 01-Home or Self Care    Follow-up Information    Leim Fabry, MD Follow up in 2 week(s).   Specialty:  Orthopedic Surgery Why:  for wound check Contact information: [address] [place] [address] [phone]            Signed: Dorise Hiss [facility] 11/24/2016, 11:54 AM

## 2016-11-24 NOTE — Evaluation (Signed)
Physical Therapy Evaluation Patient Details Name: Chelsey [name] MRN: [id number] DOB: [birth year] Today's Date: 11/24/2016   History of Present Illness  Pt admitted for proximal humerus fracture secondary to fall and is now s/p ORIF on L shoulder.  Clinical Impression  Pt is a pleasant 66 year old female who was admitted for L shoulder ORIF. Pt performs bed mobility with mod I, transfers with supervision, and ambulation with cga and no AD. Pt able to safely navigate stairs correctly and will be able to enter/exit home. Pt demonstrates deficits with strength/pain/mobility. Would benefit from skilled PT to address above deficits and promote optimal return to PLOF. Recommend transition to [place] upon discharge from acute hospitalization.       Follow Up Recommendations Home health PT    Equipment Recommendations  None recommended by PT    Recommendations for Other Services       Precautions / Restrictions Precautions Precautions: Shoulder Type of Shoulder Precautions: needs sling on at all times Shoulder Interventions: Shoulder abduction pillow;At all times;Shoulder sling/immobilizer Precaution Booklet Issued: Yes (comment) Restrictions Weight Bearing Restrictions: Yes LUE Weight Bearing: Non weight bearing      Mobility  Bed Mobility Overal bed mobility: Modified Independent             General bed mobility comments: needs bed rails for safe technique and transfers out on R side of bed. Once seated, able to sit with upright posture.  Transfers Overall transfer level: Needs assistance Equipment used: None Transfers: Sit to/from Stand Sit to Stand: Supervision         General transfer comment: safe technique performed with upright posture. Sling adjusted prior to standing  Ambulation/Gait Ambulation/Gait assistance: Min guard Ambulation Distance (Feet): 300 Feet Assistive device: None Gait Pattern/deviations: Step-through pattern     General Gait Details:  safe technique performed with no AD. Pt fatigues with increased distance. REciprocal gait pattern noted  Stairs Stairs: Yes Stairs assistance: Min guard Stair Management: No rails Number of Stairs: 2 General stair comments: safe technique performed with cues to do one step at a time  Wheelchair Mobility    Modified Rankin (Stroke Patients Only)       Balance Overall balance assessment: Modified Independent                                           Pertinent Vitals/Pain Pain Assessment: Faces Faces Pain Scale: Hurts even more Pain Location: L shoulder Pain Descriptors / Indicators: Discomfort;Dull;Operative site guarding Pain Intervention(s): Limited activity within patient's tolerance;Patient requesting pain meds-RN notified;RN gave pain meds during session;Ice applied    Home Living Family/patient expects to be discharged to:: Private residence Living Arrangements: Children (son) Available Help at Discharge: Family;Available PRN/intermittently Type of Home: House Home Access: Stairs to enter Entrance Stairs-Rails: None Entrance Stairs-Number of Steps: 2 Home Layout: One level Home Equipment: None      Prior Function Level of Independence: Independent         Comments: 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 L UE  ther-ex performed including grip squeezes, wrist flexion/extension, and elbow flexion/extension. Pt wished to hold off on pendulums at this time secondary to pain. 5-10 reps performed with safe technique   Assessment/Plan    PT Assessment Patient needs continued PT services  PT Problem List Decreased strength;Pain       PT Treatment Interventions DME instruction;Gait training;Therapeutic exercise    PT Goals (Current goals can be found in the Care Plan section)  Acute Rehab PT Goals Patient Stated Goal: to go home PT Goal Formulation: With patient Time For Goal Achievement: 12/08/16 Potential to Achieve Goals: Good    Frequency BID   Barriers to discharge        Co-evaluation               AM-PAC PT "6 Clicks" Daily Activity  Outcome Measure Difficulty turning over in bed (including adjusting bedclothes, sheets and blankets)?: None Difficulty moving from lying on back to sitting on the side of the bed? : None Difficulty sitting down on and standing up from a chair with arms (e.g., wheelchair, bedside commode, etc,.)?: None Help needed moving to and from a bed to chair (including a wheelchair)?: None Help needed walking in hospital room?: None Help needed climbing 3-5 steps with a railing? : A Little 6 Click Score: 23    End of Session Equipment Utilized During Treatment: Gait belt Activity Tolerance: Patient tolerated treatment well Patient left: in chair;with family/visitor present Nurse Communication: Mobility status PT Visit Diagnosis: Muscle weakness (generalized) (M62.81);Pain Pain - Right/Left: Left Pain - part of body: Shoulder    Time: 1017-1050 PT Time Calculation (min) (ACUTE ONLY): 33 min   Charges:   PT Evaluation $PT Eval Low Complexity: 1 Low PT Treatments $Therapeutic Exercise: 8-22 mins   PT G Codes:   PT G-Codes **NOT FOR INPATIENT CLASS** Functional Assessment Tool Used: AM-PAC 6 Clicks Basic Mobility Functional Limitation: Mobility:  Walking and moving around Mobility: Walking and Moving Around Current Status ([id number]): At least 1 percent but less than 20 percent impaired, limited or restricted Mobility: Walking and Moving Around Goal Status [phone]): 0 percent impaired, limited or restricted    Greggory Stallion, PT, DPT [phone]   Marshay Slates 11/24/2016, 12:57 PM

## 2016-11-24 NOTE — Anesthesia Postprocedure Evaluation (Signed)
Anesthesia Post Note  Patient: Chelsey [name]  Procedure(s) Performed: OPEN REDUCTION INTERNAL FIXATION (ORIF) PROXIMAL HUMERUS FRACTURE (Left Arm Upper) BICEPS TENODESIS (Left Arm Upper)  Patient location during evaluation: PACU Anesthesia Type: Regional Level of consciousness: awake and alert and oriented Pain management: pain level controlled Vital Signs Assessment: post-procedure vital signs reviewed and stable Respiratory status: spontaneous breathing, nonlabored ventilation and respiratory function stable Cardiovascular status: blood pressure returned to baseline and stable Postop Assessment: no signs of nausea or vomiting Anesthetic complications: no     Last Vitals:  Vitals:   11/23/16 2309 11/24/16 0009  BP: 127/66 128/72  Pulse: 91 94  Resp: 17 18  Temp: 36.8 C 36.6 C  SpO2: 97% 97%    Last Pain:  Vitals:   11/24/16 0009  TempSrc: Oral  PainSc:                  Willian Donson

## 2016-11-24 NOTE — Care Management (Addendum)
PT recommending home with home health. Spoke with Rachelle Hora, PA regarding recommendation. He states Dr. Posey Pronto does not want patient to have PT/OT at this time. Their office will schedule as outpatient when appropriate.

## 2016-11-24 NOTE — Discharge Planning (Signed)
Patient IV removed.  RN assessment and VS revealed stability for DC to home.  Discharge papers given, explained and educated.  Informed of suggested FU appts and appts made.  Scripts signed and given. Pain meds given just prior to DC.  Once ready, will be wheeled to front and family transporting home via car.

## 2016-11-24 NOTE — Discharge Instructions (Signed)
Chelsey Mulligan, MD  [facility]  Phone: [phone]  Fax: [phone]   Discharge Instructions after proximal humerus open reduction and internal fixation   1. Activity/Sling: You are to be non-weight bearing on operative extremity. A sling/shoulder immobilizer has been provided for you. Only remove the sling to perform elbow, wrist, and hand RoM exercises and hygiene/dressing. Active reaching and lifting are not permitted. You will be given further instructions on sling use at your first physical therapy visit and postoperative visit with Dr. Posey Pronto.   2. Dressings: Keep dressing clean and dry. Change with dry gauze and tape/bandaid as needed.  3. Driving:  Plan on not driving for six weeks. Please note that you are advised NOT to drive while taking narcotic pain medications as you may be impaired and unsafe to drive.  4. Medications:  - You have been provided a prescription for narcotic pain medicine (usually oxycodone). After surgery, take 1-2 narcotic tablets every 4 hours if needed for severe pain. Please start this as soon as you begin to start having pain (if you received a nerve block, start taking as soon as this wears off).  - A prescription for anti-nausea medication will be provided in case the narcotic medicine causes nausea - take 1 tablet every 6 hours only if nauseated.  - Take enteric coated aspirin 325 mg once daily for 6 weeks to prevent blood clots. Do not take aspirin if you have an aspirin sensitivity/allergy or asthma or are on an anticoagulant (blood thinner) already. If so, then your home anticoagulant will be resume and managed - do not take aspirin. -Take tylenol 1000mg  (2 Extra strength or 3 regular strength tablets) every 8 hours for pain. This will reduce the amount of narcotic medication needed. May stop tylenol when you are having minimal pain. - Take a stool softener (Colace, Dulcolax or Senakot) if you are using narcotic pain medications to  help with constipation that is associated with narcotic use. - DO NOT take ANY nonsteroidal anti-inflammatory pain medications: Advil, Motrin, Ibuprofen, Aleve, Naproxen, or Naprosyn.  If you are taking prescription medication for anxiety, depression, insomnia, muscle spasm, chronic pain, or for attention deficit disorder you are advised that you are at a higher risk of adverse effects with use of narcotics post-op, including narcotic addiction/dependence, depressed breathing, death. If you use non-prescribed substances: alcohol, marijuana, cocaine, heroin, methamphetamines, etc., you are at a higher risk of adverse effects with use of narcotics post-op, including narcotic addiction/dependence, depressed breathing, death. You are advised that taking > 50 morphine milligram equivalents (MME) of narcotic pain medication per day results in twice the risk of overdose or death. For your prescription provided: oxycodone 5 mg - taking more than 6 tablets per day after the first few days of surgery.  5. Physical Therapy: 1-2 times per week for ~12 weeks.  Physical therapy will start after 2-week postop visit.  You have been provided an order for physical therapy. The therapist will provide home exercises. Please contact our offices if this appointment has not been scheduled.   6. Work: May do light duty/desk job in approximately 2 weeks when off of narcotics, pain is well-controlled, and swelling has decreased if able to function with one arm in sling. Full work may take 6 weeks if light motions and function of both arms is required. Lifting jobs may require 12 weeks.  7. Post-Op Appointments: Your first post-op appointment will be with Dr. Posey Pronto in approximately 2 weeks time.   If you  find that they have not been scheduled please call the Orthopaedic Appointment front desk at [phone].                               Chelsey Mulligan, MD [facility] Phone:  [phone] Fax: [phone]   REVERSE SHOULDER ARTHROPLASTY REHAB GUIDELINES   These guidelines should be tailored to individual patients based on their rehab goals, age, precautions, quality of repair, etc.  Progression should be based on patient progress and approval by the referring physician.  PHASE 1 - Day 1 through Week 2  GENERAL GUIDELINES AND PRECAUTIONS  Sling wear 24/7 except during grooming and home exercises (3 to 5 times daily)  Avoid shoulder extension such that the arm is posterior the frontal plane.  When patients recline, a pillow should be placed behind the upper arm and sling should be on.  They should be advised to always be able to see the elbow  Avoid combined IR/ADD/EXT, such as hand behind back to prevent dislocation  Avoid combined IR and ADD such as reaching across the chest to prevent dislocation  No AROM  No submersion in pool/water for 4 weeks  No weight bearing through operative arm (as in transfers, walker use, etc)  GOALS  Maintain integrity of joint replacement; protect soft tissue healing  Increase PROM for elevation to 120 and ER to 30 (will remain the goal for first 6 weeks)  Optimize distal UE circulation and muscle activity (elbow, wrist and hand)  Instruct in use of sling for proper fit, polar care device for ice application after HEP, signs/symptoms of infection  EXERCISES  Active elbow, wrist and hand  Passive forward elevation in scapular plane to 90-120 max motion; ER in scapular plane to 30  Active scapular retraction with arms resting in neutral position  CRITERIA TO PROGRESS TO PHASE 2  Low pain (less than 3/10) with shoulder PROM  Healing of incision without signs of infection  Clearance by MD to advance after 2 week MD check up  PHASE 2 - 2 weeks - 6 weeks  GENERAL GUIDELINES AND PRECAUTIONS  Sling may be removed while at home; worn in community without abduction pillow  May use arm for light activities of  daily living (such as feeding, brushing teeth, dressing) with elbow near  the side of the body  and arm in front of the body- no active lifting of the arm  May submerge in water (tub, pool, [place], etc) after 4 weeks  Continue to avoid WBing through the operative arm  Continue to avoid combined IR/EXT/ADD (hand behind the back) and IR/ADD  (reaching across chest) for dislocation precautions  GOALS   Achieve passive elevation to 120 and ER to 30   Low (less than 3/10) to no pain   Ability to fire all heads of the deltoid  EXERCISES  May discontinue grip, and active elbow and wrist exercises since using the arm in ADLs  with sling removed around the home  Continue passive elevation to 120 and ER to 30, both in scapular plane with arm supported on table top  Add submaximal isometrics, pain free effort, for all functional heads of deltoid (anterior, posterior, middle)  Ensure that with posterior deltoid isometric the shoulder does not move into extension and the arm remains anterior the frontal plane  At 4 weeks:  begin to place arm in balanced position of 90 deg elevation in supine; when patient  able to hold this position with ease, may begin reverse pendulums clockwise and counterclockwise  CRITERIA TO PROGRESS TO PHASE 3  Passive forward elevation in scapular plane to 120; passive ER in scapular plane to 30  Ability to fire isometrically all heads of the deltoid muscle without pain  Ability to place and hold the arm in balanced position (90 deg elevation in supine)  PHASE 3 - 6 weeks to 3 months  GENERAL GUIDELINES AND PRECAUTIONS  Discontinue use of sling  Avoid forcing end range motion in any direction to prevent dislocation   May advance use of the arm actively in ADLs without being restricted to arm by the side of the body, however, avoid heavy lifting and sports (forever!)  May initiate functional IR behind the back gently  NO UPPER BODY ERGOMETER    GOALS  Optimize PROM for elevation and ER in scapular plane with realistic expectation that max  mobility for elevation is usually around 145-160 passively; ER 40 to 50 passively; functional IR to L1  Recover AROM to approach as close to PROM available as possible; may expect 135-150 deg active elevation; 30 deg active ER; active functional IR to L1  Establish dynamic stability of the shoulder with deltoid and periscapular muscle gradual strengthening  EXERCISES  Forward elevation in scapular plane active progression: supine to incline, to vertical; short to long lever arm  Balanced position long lever arm AROM  Active ER/IR with arm at side  Scapular retraction with light band resistance  Functional IR with hand slide up back - very gentle and gradual  NO UPPER BODY ERGOMETER     CRITERIA TO PROGRESS TO PHASE 4   AROM equals/approaches PROM with good mechanics for elevation    No pain   Higher level demand on shoulder than ADL functions   PHASE 4 12 months and beyond  GENERAL GUIDELINES AND PRECAUTIONS  No heavy lifting and no overhead sports  No heavy pushing activity  Gradually increase strength of deltoid and scapular stabilizers; also the rotator cuff if present with weights not to exceed 5 lbs  NO UPPER BODY ERGOMETER   GOALS   Optimize functional use of the operative UE to meet the desired demands   Gradual increase in deltoid, scapular muscle, and rotator cuff strength   Pain free functional activities   EXERCISES  Add light hand weights for deltoid up to and not to exceed 3 lbs for anterior and posterior with long arm lift against gravity; elbow bent to 90 deg for abduction in scapular plane  Theraband progression for extension to hip with scapular depression/retraction  Theraband progression for serratus anterior punches in supine; avoid wall, incline or prone pressups for serratus anterior  End range stretching gently without forceful  overpressure in all planes (elevation in scapular plane, ER in scapular plane, functional IR) with stretching done for life as part of a daily routine  NO UPPER BODY ERGOMETER     CRITERIA FOR DISCHARGE FROM SKILLED PHYSICAL THERAPY   Pain free AROM for shoulder elevation (expect around 135-150)   Functional strength for all ADLs, work tasks, and hobbies approved by Psychologist, sport and exercise   Independence with home maintenance program   NOTES: 1. With proper exercise, motion, strength, and function continue to improve even after one year. 2. The complication rate after surgery is 5 - 8%. Complications include infection, fracture, heterotopic bone formation, nerve injury, instability, rotator cuff tear, and tuberosity nonunion. Please look for clinical signs, unusual symptoms, or lack  of progress with therapy and report those to Dr. Posey Pronto. Prefer more communication than less.  3. The therapy plan above only serves as a guide. Please be aware of specific individualized patient instructions as written on the prescription or through discussions with the surgeon. 4. Please call Dr. Posey Pronto if you have any specific questions or concerns [phone]

## 2016-11-24 NOTE — Evaluation (Signed)
Occupational Therapy Evaluation Patient Details Name: Chelsey [name] MRN: [id number] DOB: 03-03-1950 Today's Date: 11/24/2016    History of Present Illness Pt admitted for proximal humerus fracture secondary to fall and is now s/p ORIF on L shoulder.   Clinical Impression   Patient was evaluated this date by OT, she lives at home with her adult son (works during the day), driving, indep with ADL and mobility, working full time as a Health and safety inspector. Patient has family to assist at home PRN (son and sister able to help PRN; both work during the day). Patient has orders for LUE (dominant) immobilized and will be NWBing per MD. Patient presents with pain, decreased strength/ROM, decreased ability to perform self care tasks. Pt/sister educated in elbow/wrist/hand ROM exercises, shoulder sling wear schedule/positioning, polar care mgt, compression stocking mgt, AE/DME for ADL tasks, and compensatory strategies to perform ADL tasks while maintaining LUE NWBing via verbal instruction and visual demonstration. Pt/sister verbalized understanding of all education/training provided. Do not anticipate any skilled OT needs upon return home from hospital. Recommend pt follow up with therapy as arranged by the surgeon. Will sign off.    Follow Up Recommendations  DC plan and follow up therapy as arranged by surgeon    Equipment Recommendations  3 in 1 bedside commode    Recommendations for Other Services       Precautions / Restrictions Precautions Precautions: Shoulder Type of Shoulder Precautions: needs sling on at all times Shoulder Interventions: Shoulder abduction pillow;At all times;Shoulder sling/immobilizer Precaution Booklet Issued: No Restrictions Weight Bearing Restrictions: Yes LUE Weight Bearing: Non weight bearing      Mobility Bed Mobility             General bed mobility comments: deferred, pt up in recliner for session  Transfers Overall transfer level: Needs  assistance Equipment used: None Transfers: Sit to/from Stand Sit to Stand: Supervision         General transfer comment: safe technique performed with upright posture. Sling adjusted prior to standing    Balance Overall balance assessment: Modified Independent                                         ADL either performed or assessed with clinical judgement   ADL Overall ADL's : Needs assistance/impaired                                       General ADL Comments: Pt requires supervision to min assist for basic ADL tasks; pt/sister educated in AE/DME for bathing/dressing/toileting in order to maximize safety and functional independence. Pt/sister educated in compression stocking mgt, polar care mgt, and shoulder sling/immobilizer wear/fit/positioning. Pt/sister verbalized understanding.      Vision Baseline Vision/History: Wears glasses Wears Glasses: Reading only Patient Visual Report: No change from baseline Vision Assessment?: No apparent visual deficits     Perception     Praxis      Pertinent Vitals/Pain Pain Assessment: 0-10 Pain Score: 7  Faces Pain Scale: Hurts even more Pain Location: L shoulder Pain Descriptors / Indicators: Discomfort;Dull;Operative site guarding Pain Intervention(s): Limited activity within patient's tolerance;Monitored during session;Ice applied;Repositioned     Hand Dominance Left   Extremity/Trunk Assessment Upper Extremity Assessment Upper Extremity Assessment: LUE deficits/detail;RUE deficits/detail RUE Deficits / Details: WFL LUE Deficits / Details:  hand/wrist/elbow 5/5  LUE: Unable to fully assess due to immobilization   Lower Extremity Assessment Lower Extremity Assessment: Defer to PT evaluation;Overall [facility] for tasks assessed   Cervical / Trunk Assessment Cervical / Trunk Assessment: Normal   Communication Communication Communication: No difficulties   Cognition Arousal/Alertness:  Awake/alert Behavior During Therapy: WFL for tasks assessed/performed Overall Cognitive Status: Within Functional Limits for tasks assessed                                     General Comments       Exercises Exercises: Other exercises Other Exercises Other Exercises: Pt/sister educated in elbow/wrist/hand ROM exercises and compensatory strategies to perform ADL tasks while maintaining LUE NWBing via verbal instruction and visual demonstration. Pt/sister verbalized understanding.   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, newspaper Problem List:        OT Treatment/Interventions:      OT Goals(Current goals can be found in the care plan section) Acute Rehab OT Goals Patient Stated Goal: to go home OT Goal Formulation: All assessment and education complete, DC therapy  OT Frequency:     Barriers to D/C:            Co-evaluation              AM-PAC PT "6 Clicks" Daily Activity     Outcome Measure Help from another person eating meals?: None Help from another person taking care of personal grooming?: None Help from another person toileting, which includes using toliet, bedpan, or urinal?: None Help from another person bathing (including washing, rinsing, drying)?: A Little Help from another person to put on and taking off regular upper body  clothing?: A Little Help from another person to put on and taking off regular lower body clothing?: A Little 6 Click Score: 21   End of Session Nurse Communication: Other (comment) (notified RN of completion of OT evaluation)  Activity Tolerance: Patient tolerated treatment well Patient left: in chair;with call bell/phone within reach;with chair alarm set;with family/visitor present;with SCD's reapplied;Other (comment) (polar care, sling in place)  OT Visit Diagnosis: Other abnormalities of gait and mobility (R26.89)                Time: 6962-9528 OT Time Calculation (min): 22 min Charges:  OT General Charges $OT Visit: 1 Visit OT Evaluation $OT Eval Low Complexity: 1 Low OT Treatments $Self Care/Home Management : 8-22 mins G-Codes: OT G-codes **NOT FOR INPATIENT CLASS** Functional Assessment Tool Used: AM-PAC 6 Clicks Daily Activity;Clinical judgement Functional Limitation: Self care Self Care Current Status (U1324): At least 1 percent but less than 20 percent impaired, limited or restricted Self Care Goal Status (M0102): At least 1 percent but less than 20 percent impaired, limited or restricted Self  Care Discharge Status [phone]): At least 1 percent but less than 20 percent impaired, limited or restricted   Jeni Salles, MPH, MS, OTR/L ascom [phone] 11/24/16, 1:34 PM

## 2016-11-24 NOTE — Progress Notes (Addendum)
   Subjective: 1 Day Post-Op Procedure(s) (LRB): OPEN REDUCTION INTERNAL FIXATION (ORIF) PROXIMAL HUMERUS FRACTURE (Left) BICEPS TENODESIS (Left) Patient reports pain as mild.   Patient is well, and has had no acute complaints or problems Denies any CP, SOB, ABD pain. We will continue therapy today.    Objective: Vital signs in last 24 hours: Temp:  [97.4 F (36.3 C)-98.6 F (37 C)] 98.6 F (37 C) (10/31 0820) Pulse Rate:  [67-116] 85 (10/31 0820) Resp:  [11-22] 18 (10/31 0820) BP: (100-137)/(57-74) 104/57 (10/31 0820) SpO2:  [94 %-100 %] 100 % (10/31 0820) Weight:  [81.6 kg (180 lb)] 81.6 kg (180 lb) (10/30 1049)  Intake/Output from previous day: 10/30 0701 - 10/31 0700 In: 4230 [I.V.:4030; IV Piggyback:200] Out: 2725 [Urine:2135; Drains:40; Blood:550] Intake/Output this shift: No intake/output data recorded.   Recent Labs  11/22/16 0830 11/24/16 0537  HGB 12.7 10.2*    Recent Labs  11/22/16 0830 11/24/16 0537  WBC 8.0 8.9  RBC 3.99 3.20*  HCT 37.5 29.9*  PLT 350 350    Recent Labs  11/22/16 0830 11/24/16 0537  NA 137 134*  K 3.7 4.2  CL 100* 102  CO2 27 27  BUN 15 12  CREATININE 0.73 0.75  GLUCOSE 93 124*  CALCIUM 9.3 8.1*    Recent Labs  11/22/16 0830  INR 0.94    EXAM General - Patient is Alert, Appropriate and Oriented Extremity - Neurovascular intact Sensation intact distally Intact pulses distally Dorsiflexion/Plantar flexion intact No cellulitis present Compartment soft  Neurovascular intact left upper extremity Dressing - dressing C/D/I Hemovac removed Motor Function - intact, moving foot and toes well on exam.   Past Medical History:  Diagnosis Date  . Breast cancer ([place]) 1997   right breast, radiation  . Bronchitis    recent/ 06/09/15 had chest xray/ [facility]/resolved  . Cancer [facility]) 1997   right lumpectomy,L/Ad/R   . GERD (gastroesophageal reflux disease)   . Hypercholesteremia   . Hyperlipidemia   . Panic  attack   . Personal history of malignant neoplasm of breast     Assessment/Plan:   1 Day Post-Op Procedure(s) (LRB): OPEN REDUCTION INTERNAL FIXATION (ORIF) PROXIMAL HUMERUS FRACTURE (Left) BICEPS TENODESIS (Left) Active Problems:   S/P ORIF (open reduction internal fixation) fracture   Proximal humerus fracture  Estimated body mass index is 29.95 kg/m as calculated from the following:   Height as of this encounter: 5\' 5"  (1.651 m).   Weight as of this encounter: 81.6 kg (180 lb). Advance diet Up with therapy, nonweightbearing left upper extremity.  Pendulum exercises only.  No passive range of motion. Pain well controlled Vital signs within normal limits. Acute post op blood loss anemia -hemoglobin 10.2.  Patient asymptomatic. Discharge home pending good progress of PT today.  DVT Prophylaxis - Aspirin Nonweightbearing left upper extremity.   Ronney Asters, PA-C [place] 11/24/2016, 8:28 AM

## 2016-11-25 ENCOUNTER — Encounter: Payer: Self-pay | Admitting: Orthopedic Surgery

## 2016-11-25 ENCOUNTER — Telehealth: Payer: Self-pay | Admitting: Psychiatry

## 2016-11-25 NOTE — Op Note (Signed)
Operative Note    SURGERY DATE:11/23/16   PRE-OP DIAGNOSIS:  1. Left proximal humerus fracture   POST-OP DIAGNOSIS:  1. Left proximal humerus fracture 2. Left Biceps tendinopathy   PROCEDURES:  1. ORIF L proximal humerus 2. 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 This is a worker's compensation injury. After discussion of risks, benefits, and alternatives to surgery, the patient elected to proceed with ORIF L proximal humerus and possible biceps tenodesis. It was medically necessary to perform this surgery within 2 weeks of injury to prevent bony healing in a malreduced position.    OPERATIVE FINDINGS: 3-part varus proximal humerus fracture with significant medial bone loss as well as severely osteoporotic bone; biceps tendinopathy   OPERATIVE REPORT:   I identified @NAME @ in the pre-operative holding area. Informed consent was obtained and the surgical site was marked. I reviewed the risks and benefits of the proposed surgical intervention and the patient (and/or patient's guardian) wished to proceed. An interscalene block was administered by the Anesthesia team. The patient was transferred to the operative suite and general anesthesia was administered. The patient was placed in the beach chair position with the head of the bed elevated approximately 45 degrees. All down side pressure points were appropriately padded. Appropriate IV antibiotics were administered within 30 minutes before incision. The extremity was then prepped and draped in standard fashion. A time out was performed confirming the correct extremity,  correct patient, and correct procedure.   We used the standard deltopectoral incision from the coracoid to ~12cm distal. We found the cephalic vein and took it laterally. We opened the deltopectoral interval widely and placed retractors under the CA ligament in the subacromial space and under the deltoid tendon at its insertion.We released the underlying hemorrhagic bursa between these retractors, taking care not to damage the circumflex branch of the axillary nerve.   We opened the clavipectoral fascia lateral to the conjoint tendon. We gently palpated the axillary nerve and verified its position and continuity on both sides of the humerus with a Tug test. Note, this test was repeated multiple times during the procedure for nerve localization and confirmed to be intact at the end of the case. We cut the falciform ligament at approximately 1 cm of the upper portion of the pectoralis major insertion. Next we unroofed the bicipital groove. The biceps tendon was inflamed and tendinopathic. We proceeded with a soft tissue biceps tenodesis given the pathology of the tendon.  After opening the biceps tendon sheath all the way to the supraglenoid tubercle, we performed a biceps tenodesis with two #2 Fiberwire sutures to the upper border of the pectoralis major. The proximal portion of the tendon was excised. The floor of the biciptal groove was cleaned and this was used as a landmark for appropriate plate position and reduction.    Sutures with #5 Ethibond and #2 Fiberwire were placed in the subscapularis, supraspinatus, and infraspinatus tendons. Two K-wires were placed into the humeral head fragment to use as joysticks. A cobb elevator was placed on the medial aspect of the inferior humeral head. A combination of elevating with the Cobb, using  the rotator cuff sutures, and using the joysticks reduced the head out of varus. The humeral shaft was also pushed posteriorly to reduce it into an appropriate position.  There was also a large medial spike of bone that could not be reduced so it was removed. There was a defect in the region of the medial calcar. Fluoroscopy was used to confirm appropriate reduction in orthogonal planes. K-wires were used to hold the reduction.   A Stryker proximal humeral locking plate was selected and position was confirmed radiographically. Proximal locking screws were placed. The position of the calcar locking screws did not achieve any purchase due to bone loss. Therefore, cancelleous screws were placed in a different direction through the calcar locking holes. Next, the plate was fixed the the humeral shaft using a locking screw first so as to not lateralize the humeral shaft. Two additional cortical screws were placed in the humeral shaft.   We used 5cc of Hydroset (calcium phosphate) and then an additional 5cc of MTF DBX putty in the area of the medial defect for additional bony support. At this point, we noted that there was still significant movement of the humeral head independently and the screws did not have good purchase into the proximal humerus fragment. We then tied the subscapularis sutures into the plate as well as the supraspinatus sutures into the plate. I could not tie the infraspinatus sutures into the plate so another subscapularis suture was placed and these were tied to each other. With this reinforcement, the humeral head had much better stability. Final fluoroscopic images showed acceptable reduction and hardware position without penetration of screws into the glenohumeral joint. On range of motion, the humeral head and shaft moved as a unit without excess impingement. The wound was irrigated. The rotator interval was closed with #2 Fiberwire.   A Hemovac drain was placed.  We closed the deltopectoral interval deep to the cephalic vein with a running, #2 Ticron suture. The skin was closed with 2-0 Vicryl and staples. Xeroform and Honeycomb dressing was applied. A  PolarCare unit and sling were placed. Patient was extubated, transferred to a stretcher bed and to the post anesthesia care unit in stable condition.   POSTOPERATIVE PLAN: The patient will be admitted with plan for discharge home on POD#1. Operative arm to remain in sling at all times except RoM exercises and hygiene. No RoM exercises for 2 weeks. Outpatient PT to start at that time. ASA 325mg  x 6 weeks for DVT ppx. Patient to return to clinic in ~2 weeks for post-operative appointment.

## 2016-11-27 NOTE — H&P (Signed)
Paper H&P to be scanned into permanent record. H&P reviewed. No significant changes noted.  

## 2016-11-29 ENCOUNTER — Encounter: Payer: Self-pay | Admitting: Psychiatry

## 2016-11-29 ENCOUNTER — Other Ambulatory Visit: Payer: Self-pay

## 2016-11-29 ENCOUNTER — Ambulatory Visit (INDEPENDENT_AMBULATORY_CARE_PROVIDER_SITE_OTHER): Payer: Federal, State, Local not specified - PPO | Admitting: Psychiatry

## 2016-11-29 VITALS — BP 101/66 | HR 99 | Temp 98.7°F

## 2016-11-29 DIAGNOSIS — F331 Major depressive disorder, recurrent, moderate: Secondary | ICD-10-CM | POA: Diagnosis not present

## 2016-11-29 MED ORDER — BUSPIRONE HCL 10 MG PO TABS: 10.0000 mg | ORAL_TABLET | Freq: Two times a day (BID) | ORAL | 1 refills | Status: DC

## 2016-11-29 MED ORDER — MIRTAZAPINE 30 MG PO TABS: 30.0000 mg | ORAL_TABLET | Freq: Every day | ORAL | 3 refills | Status: DC

## 2016-11-29 MED ORDER — HYDROXYZINE HCL 25 MG PO TABS: 25.0000 mg | ORAL_TABLET | Freq: Every day | ORAL | 1 refills | Status: DC | PRN

## 2016-11-29 NOTE — Progress Notes (Signed)
BH MD/PA/NP OP Progress Note  11/29/2016 10:01 AM Chelsey [name]  MRN:  [id number]  Subjective:  Patient is a 66 year old female who presented for the follow-up appointment. She noted that she fell on October 15 while working. She fractured her shoulder and came in after she had the surgery. She is currently living with her son. Her sister took her to the appointment. Patient reported that she is in pain and has been living with her family who are helping her. Patient reported that she is currently off for the next 4 weeks. She reported that she has been compliant with her medications. She is taking Vistaril to help with her anxiety. She currently denied having any suicidal ideations or plans. She reported that she is planning to stay at home. She denied having any use of pain medications at this time. She appeared calm and alert during the interview. She was in the shoulder splint at this time.  Chief Complaint:  Chief Complaint    Follow-up; Medication Refill     Visit Diagnosis:     ICD-10-CM   1. MDD (major depressive disorder), recurrent episode, moderate ([place]) F33.1     Past Medical History:  Past Medical History:  Diagnosis Date  . Breast cancer ([place]) 1997   right breast, radiation  . Bronchitis    recent/ 06/09/15 had chest xray/ [facility]/resolved  . Cancer [facility]) 1997   right lumpectomy,L/Ad/R   . GERD (gastroesophageal reflux disease)   . Hypercholesteremia   . Hyperlipidemia   . Panic attack   . Personal history of malignant neoplasm of breast     Past Surgical History:  Procedure Laterality Date  . BREAST EXCISIONAL BIOPSY Right 1997   pos  . BREAST SURGERY Right 1997   lumpectomy  . COLONOSCOPY  2008   Family History:  Family History  Problem Relation Age of Onset  . Anxiety disorder Brother   . Obesity Brother   . COPD Brother   . Kidney disease Mother   . Heart attack Father   . Hypertension Father   . Alcohol abuse Father   . Depression  Brother   . Anxiety disorder Brother   . Breast cancer Maternal Grandmother    Social History:  Social History   Socioeconomic History  . Marital status: Widowed    Spouse name: None  . Number of children: None  . Years of education: None  . Highest education level: None  Social Needs  . Financial resource strain: None  . Food insecurity - worry: None  . Food insecurity - inability: None  . Transportation needs - medical: None  . Transportation needs - non-medical: None  Occupational History  . None  Tobacco Use  . Smoking status: Current Every Day Smoker    Packs/day: 0.25    Years: 5.00    Pack years: 1.25    Types: Cigarettes    Start date: 11/25/2009    Last attempt to quit: 03/09/2016    Years since quitting: 0.7  . Smokeless tobacco: Never Used  . Tobacco comment: Has tried patches and may go back on these again.  Says they work.   Substance and Sexual Activity  . Alcohol use: No    Alcohol/week: 0.0 oz    Comment: rarely  . Drug use: No  . Sexual activity: No  Other Topics Concern  . None  Social History Narrative  . None   Additional History:  Currently her son is living with her and  patient is trying to help him move out into an apartment. 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  Assessment:   Musculoskeletal: Strength & Muscle Tone: within normal limits Gait & Station: normal Patient leans: N/A  Psychiatric Specialty Exam: Medication Refill     Review of Systems  Psychiatric/Behavioral: Positive for depression. The patient is nervous/anxious.     Blood pressure 101/66, pulse 99, temperature 98.7 F (37.1 C), temperature source Oral.There is no height or weight on file to calculate BMI.  General Appearance: Casual  Eye Contact:  Good  Speech:  Clear and Coherent  Volume:  Normal  Mood:  Euthymic  Affect:  Congruent  Thought Process:  Coherent  Orientation:  Full (Time, Place, and Person)  Thought Content:  WDL  Suicidal  Thoughts:  No  Homicidal Thoughts:  No  Memory:  Immediate;   Fair  Judgement:  Fair  Insight:  Fair  Psychomotor Activity:  Decreased  Concentration:  Fair  Recall:  [employer] of Knowledge: Fair  Language: Fair  Akathisia:  No  Handed:  Right  AIMS (if indicated):    Assets:  Communication Skills Desire for Improvement Social Support  ADL's:  Intact  Cognition: WNL  Sleep: 6- 7 with trazodone    Is the patient at risk to self?  No. Has the patient been a risk to self in the past 6 months?  No. Has the patient been a risk to self within the distant past?  No. Is the patient a risk to others?  No. Has the patient been a risk to others in the past 6 months?  No. Has the patient been a risk to others within the distant past?  No.  Current Medications: Current Outpatient Medications  Medication Sig Dispense Refill  . alendronate (FOSAMAX) 70 MG tablet Take 1 tablet (70 mg total) by mouth once a week. 12 tablet 3  . aspirin EC 325 MG EC tablet Take 1 tablet (325 mg total) by mouth daily. 30 tablet 0  . busPIRone (BUSPAR) 10 MG tablet Take 1 tablet (10 mg total) 2 (two) times daily by mouth. 180 tablet 1  . Calcium Carbonate-Vitamin D (CALCIUM + D PO) Take 1 tablet by mouth daily. am    . Cholecalciferol (VITAMIN D) 2000 units tablet Take 2,000 Units by mouth daily.    . hydrOXYzine (ATARAX/VISTARIL) 25 MG tablet Take 1 tablet (25 mg total) daily as needed by mouth for anxiety. 90 tablet 1  . mirtazapine (REMERON) 30 MG tablet Take 1 tablet (30 mg total) at bedtime by mouth. 90 tablet 3  . Multiple Vitamin (MULTIVITAMIN) tablet Take 1 tablet by mouth daily. am    . omeprazole (PRILOSEC) 20 MG capsule Take 1 capsule (20 mg total) by mouth 2 (two) times daily before a meal. (Patient taking differently: Take 20 mg by mouth daily. ) 90 capsule 3  . oxyCODONE (OXY IR/ROXICODONE) 5 MG immediate release tablet Take 1-2 tablets (5-10 mg total) by mouth every 4 (four) hours as needed for  moderate pain. 30 tablet 0  . simvastatin (ZOCOR) 20 MG tablet Take 1 tablet (20 mg total) by mouth daily. bedtime (Patient taking differently: Take 20 mg by mouth at bedtime. bedtime) 90 tablet 1   No current facility-administered medications for this visit.     Medical Decision Making:  Review of Psycho-Social Stressors (1) and Review and summation of old records (2)  Treatment Plan Summary:Medication management  Continue medications as follows  Depression  She will also continue on Remeron 30 mg by mouth daily at bedtime . BuSpar 10 mg by mouth 3 times a day Hydroxyzine 25 mg by mouth daily at bedtime when necessary Medication refill for the 90 day supply     More than 50% of the time spent in psychoeducation, counseling and coordination of care.   Follow-up in 3 months or earlier depending on her symptoms  This note was generated in part or whole with voice recognition software. Voice regonition is usually quite accurate but there are transcription errors that can and very often do occur. I apologize for any typographical errors that were not detected and corrected.    Rainey Pines, MD    11/29/2016, 10:01 AM

## 2016-12-08 ENCOUNTER — Other Ambulatory Visit: Payer: Self-pay | Admitting: Orthopedic Surgery

## 2016-12-08 ENCOUNTER — Ambulatory Visit
Admission: RE | Admit: 2016-12-08 | Discharge: 2016-12-08 | Disposition: A | Discharge status: Home / Self Care | Payer: Federal, State, Local not specified - PPO | Source: Ambulatory Visit | Attending: Orthopedic Surgery | Admitting: Orthopedic Surgery

## 2016-12-08 DIAGNOSIS — M25512 Pain in left shoulder: Secondary | ICD-10-CM | POA: Diagnosis present

## 2016-12-08 DIAGNOSIS — G8929 Other chronic pain: Secondary | ICD-10-CM

## 2016-12-08 DIAGNOSIS — X58XXXD Exposure to other specified factors, subsequent encounter: Secondary | ICD-10-CM | POA: Insufficient documentation

## 2016-12-08 DIAGNOSIS — S42202K Unspecified fracture of upper end of left humerus, subsequent encounter for fracture with nonunion: Secondary | ICD-10-CM | POA: Diagnosis not present

## 2016-12-08 DIAGNOSIS — R937 Abnormal findings on diagnostic imaging of other parts of musculoskeletal system: Secondary | ICD-10-CM | POA: Insufficient documentation

## 2016-12-08 DIAGNOSIS — M25412 Effusion, left shoulder: Secondary | ICD-10-CM | POA: Insufficient documentation

## 2016-12-09 ENCOUNTER — Other Ambulatory Visit: Payer: Self-pay | Admitting: Orthopedic Surgery

## 2016-12-09 DIAGNOSIS — M25512 Pain in left shoulder: Secondary | ICD-10-CM

## 2016-12-20 ENCOUNTER — Encounter
Admission: RE | Admit: 2016-12-20 | Discharge: 2016-12-20 | Disposition: A | Discharge status: Home / Self Care | Payer: Federal, State, Local not specified - PPO | Source: Ambulatory Visit | Attending: Orthopedic Surgery | Admitting: Orthopedic Surgery

## 2016-12-20 ENCOUNTER — Other Ambulatory Visit: Payer: Self-pay

## 2016-12-20 DIAGNOSIS — S42202A Unspecified fracture of upper end of left humerus, initial encounter for closed fracture: Secondary | ICD-10-CM | POA: Diagnosis not present

## 2016-12-20 DIAGNOSIS — Z01818 Encounter for other preprocedural examination: Secondary | ICD-10-CM | POA: Insufficient documentation

## 2016-12-20 DIAGNOSIS — X58XXXA Exposure to other specified factors, initial encounter: Secondary | ICD-10-CM | POA: Diagnosis not present

## 2016-12-20 LAB — BASIC METABOLIC PANEL
Anion gap: 10 (ref 5–15)
BUN: 21 mg/dL — AB (ref 6–20)
CHLORIDE: 104 mmol/L (ref 101–111)
CO2: 24 mmol/L (ref 22–32)
CREATININE: 0.7 mg/dL (ref 0.44–1.00)
Calcium: 9.4 mg/dL (ref 8.9–10.3)
GFR calc non Af Amer: >60 mL/min (ref >60)
Glucose, Bld: 113 mg/dL — ABNORMAL HIGH (ref 65–99)
POTASSIUM: 4.1 mmol/L (ref 3.5–5.1)
SODIUM: 138 mmol/L (ref 135–145)

## 2016-12-20 LAB — CBC
HEMATOCRIT: 37.6 % (ref 35.0–47.0)
HEMOGLOBIN: 12.5 g/dL (ref 12.0–16.0)
MCH: 30.2 pg (ref 26.0–34.0)
MCHC: 33.2 g/dL (ref 32.0–36.0)
MCV: 90.9 fL (ref 80.0–100.0)
Platelets: 303 10*3/uL (ref 150–440)
RBC: 4.14 MIL/uL (ref 3.80–5.20)
RDW: 13.6 % (ref 11.5–14.5)
WBC: 7.7 10*3/uL (ref 3.6–11.0)

## 2016-12-20 LAB — URINALYSIS, ROUTINE W REFLEX MICROSCOPIC
Bilirubin Urine: NEGATIVE
Glucose, UA: NEGATIVE mg/dL
Hgb urine dipstick: NEGATIVE
KETONES UR: NEGATIVE mg/dL
LEUKOCYTES UA: NEGATIVE
NITRITE: NEGATIVE
PROTEIN: NEGATIVE mg/dL
Specific Gravity, Urine: 1.019 (ref 1.005–1.030)
pH: 5 (ref 5.0–8.0)

## 2016-12-20 LAB — PROTIME-INR
INR: 0.91
PROTHROMBIN TIME: 12.2 s (ref 11.4–15.2)

## 2016-12-20 LAB — TYPE AND SCREEN
ABO/RH(D): B POS
ANTIBODY SCREEN: NEGATIVE

## 2016-12-20 LAB — SURGICAL PCR SCREEN

## 2016-12-20 NOTE — Patient Instructions (Signed)
Your procedure is scheduled on: Monday 12/27/16 Report to [facility]. 2ND FLOOR MEDICAL MALL ENTRANCE. To find out your arrival time please call [phone] between 1PM - 3PM on Friday 12/24/16.  Remember: Instructions that are not followed completely may result in serious medical risk, up to and including death, or upon the discretion of your surgeon and anesthesiologist your surgery may need to be rescheduled.    __X__ 1. Do not eat anything after midnight the night before your    procedure.  No gum chewing or hard candies.  You may drink clear   liquids up to 2 hours before you are scheduled to arrive at the   hospital for your procedure. Do not drink clear liquids within 2   hours of scheduled arrival to the hospital as this may lead to your   procedure being delayed or rescheduled.       Clear liquids include:   Water or Apple juice without pulp   Clear carbohydrate beverage such as Clearfast or Gatorade   Black coffee or Clear Tea (no milk, no creamer, do not add anything   to the coffee or tea)    Diabetics should only drink water   __X__ 2. No Alcohol for 24 hours before or after surgery.   ____ 3. Bring all medications with you on the day of surgery if instructed.    __X__ 4. Notify your doctor if there is any change in your medical condition     (cold, fever, infections).             __X___5. No smoking within 24 hours of your surgery.     Do not wear jewelry, make-up, hairpins, clips or nail polish.  Do not wear lotions, powders, or perfumes.   Do not shave 48 hours prior to surgery. Men may shave face and neck.  Do not bring valuables to the hospital.    [facility] is not responsible for any belongings or valuables.               Contacts, dentures or bridgework may not be worn into surgery.  Leave your suitcase in the car. After surgery it may be brought to your room.  For patients admitted to the hospital, discharge time is determined by your                treatment  team.   Patients discharged the day of surgery will not be allowed to drive home.   Please read over the following fact sheets that you were given:   MRSA Information   __X__ Take these medicines the morning of surgery with A SIP OF WATER:    1. BUSPIRONE  2. HYDROXYZINE IF NEEDED  3. OMEPRAZOLE AT BEDTIME AND MORNING OF SURGERY  4. OXYCODONE IF NEEDED  5.  6.  ____ Fleet Enema (as directed)   __X__ Use CHG Soap/SAGE wipes as directed  ____ Use inhalers on the day of surgery  ____ Stop metformin 2 days prior to surgery    ____ Take 1/2 of usual insulin dose the night before surgery and none on the morning of surgery.   __X__ Stop Coumadin/Plavix/aspirin on CALL DR PATEL REGARDING ASPIRIN  __X__ Stop Anti-inflammatories such as Advil, Aleve, Ibuprofen, Motrin, Naproxen, Naprosyn, Goodies,powder, or aspirin products.  OK to take Tylenol.   __X__ Stop supplements, Vitamin E, Fish Oil until after surgery.    ____ Bring C-Pap to the hospital.

## 2016-12-21 LAB — URINE CULTURE: CULTURE: NO GROWTH

## 2016-12-25 ENCOUNTER — Encounter: Payer: Self-pay | Admitting: Anesthesiology

## 2016-12-26 MED ORDER — TRANEXAMIC ACID 1000 MG/10ML IV SOLN
1000.0000 mg | INTRAVENOUS | Status: AC
  Administered 2016-12-27: 1000 mg via INTRAVENOUS
  Filled 2016-12-26: qty 10

## 2016-12-26 MED ORDER — CEFAZOLIN SODIUM-DEXTROSE 2-4 GM/100ML-% IV SOLN
2.0000 g | Freq: Once | INTRAVENOUS | Status: AC
  Administered 2016-12-27 (×2): 2 g via INTRAVENOUS

## 2016-12-27 ENCOUNTER — Encounter
Admission: RE | Disposition: A | Discharge status: Home / Self Care | Payer: Self-pay | Source: Ambulatory Visit | Attending: Orthopedic Surgery

## 2016-12-27 ENCOUNTER — Inpatient Hospital Stay
Admission: RE | Admit: 2016-12-27 | Discharge: 2016-12-29 | DRG: 483 | Disposition: A | Discharge status: Home / Self Care | Source: Ambulatory Visit | Attending: Orthopedic Surgery | Admitting: Orthopedic Surgery

## 2016-12-27 ENCOUNTER — Inpatient Hospital Stay: Admitting: Anesthesiology

## 2016-12-27 ENCOUNTER — Inpatient Hospital Stay

## 2016-12-27 ENCOUNTER — Encounter: Payer: Self-pay | Admitting: *Deleted

## 2016-12-27 ENCOUNTER — Other Ambulatory Visit: Payer: Self-pay

## 2016-12-27 DIAGNOSIS — M25512 Pain in left shoulder: Secondary | ICD-10-CM | POA: Diagnosis present

## 2016-12-27 DIAGNOSIS — T84191A Other mechanical complication of internal fixation device of left humerus, initial encounter: Principal | ICD-10-CM | POA: Diagnosis present

## 2016-12-27 DIAGNOSIS — S42292G Other displaced fracture of upper end of left humerus, subsequent encounter for fracture with delayed healing: Secondary | ICD-10-CM | POA: Diagnosis not present

## 2016-12-27 DIAGNOSIS — K219 Gastro-esophageal reflux disease without esophagitis: Secondary | ICD-10-CM | POA: Diagnosis present

## 2016-12-27 DIAGNOSIS — F410 Panic disorder [episodic paroxysmal anxiety] without agoraphobia: Secondary | ICD-10-CM | POA: Diagnosis present

## 2016-12-27 DIAGNOSIS — Z853 Personal history of malignant neoplasm of breast: Secondary | ICD-10-CM

## 2016-12-27 DIAGNOSIS — G8929 Other chronic pain: Secondary | ICD-10-CM | POA: Diagnosis present

## 2016-12-27 DIAGNOSIS — E7800 Pure hypercholesterolemia, unspecified: Secondary | ICD-10-CM | POA: Diagnosis present

## 2016-12-27 DIAGNOSIS — Z419 Encounter for procedure for purposes other than remedying health state, unspecified: Secondary | ICD-10-CM

## 2016-12-27 DIAGNOSIS — Y831 Surgical operation with implant of artificial internal device as the cause of abnormal reaction of the patient, or of later complication, without mention of misadventure at the time of the procedure: Secondary | ICD-10-CM | POA: Diagnosis present

## 2016-12-27 DIAGNOSIS — E785 Hyperlipidemia, unspecified: Secondary | ICD-10-CM | POA: Diagnosis present

## 2016-12-27 DIAGNOSIS — Z96619 Presence of unspecified artificial shoulder joint: Secondary | ICD-10-CM

## 2016-12-27 DIAGNOSIS — L8990 Pressure ulcer of unspecified site, unspecified stage: Secondary | ICD-10-CM

## 2016-12-27 HISTORY — PX: REVERSE SHOULDER ARTHROPLASTY: SHX5054

## 2016-12-27 HISTORY — PX: HARDWARE REMOVAL: SHX979

## 2016-12-27 LAB — SURGICAL PCR SCREEN
MRSA, PCR: NEGATIVE
STAPHYLOCOCCUS AUREUS: NEGATIVE

## 2016-12-27 SURGERY — ARTHROPLASTY, SHOULDER, TOTAL, REVERSE
Anesthesia: Regional | Laterality: Left

## 2016-12-27 MED ORDER — DOCUSATE SODIUM 100 MG PO CAPS
100.0000 mg | ORAL_CAPSULE | Freq: Two times a day (BID) | ORAL | Status: DC
  Administered 2016-12-27 – 2016-12-28 (×3): 100 mg via ORAL
  Filled 2016-12-27 (×3): qty 1

## 2016-12-27 MED ORDER — TRANEXAMIC ACID 1000 MG/10ML IV SOLN
INTRAVENOUS | Status: AC
  Filled 2016-12-27: qty 10

## 2016-12-27 MED ORDER — DIPHENHYDRAMINE-APAP (SLEEP) 25-500 MG PO TABS: 1.0000 | ORAL_TABLET | Freq: Every evening | ORAL | Status: DC | PRN

## 2016-12-27 MED ORDER — METHOCARBAMOL 500 MG PO TABS
500.0000 mg | ORAL_TABLET | Freq: Four times a day (QID) | ORAL | Status: DC | PRN
  Administered 2016-12-28: 500 mg via ORAL
  Filled 2016-12-27: qty 1

## 2016-12-27 MED ORDER — FENTANYL CITRATE (PF) 100 MCG/2ML IJ SOLN
50.0000 ug | Freq: Once | INTRAMUSCULAR | Status: AC
  Administered 2016-12-27: 50 ug via INTRAVENOUS

## 2016-12-27 MED ORDER — LACTATED RINGERS IV SOLN
INTRAVENOUS | Status: DC
  Administered 2016-12-27 (×2): via INTRAVENOUS

## 2016-12-27 MED ORDER — METOCLOPRAMIDE HCL 5 MG/ML IJ SOLN: 5.0000 mg | Freq: Three times a day (TID) | INTRAMUSCULAR | Status: DC | PRN

## 2016-12-27 MED ORDER — DEXTROSE 5 % IV SOLN
2.0000 g | Freq: Four times a day (QID) | INTRAVENOUS | Status: AC
  Administered 2016-12-27 – 2016-12-28 (×3): 2 g via INTRAVENOUS
  Filled 2016-12-27 (×4): qty 2000

## 2016-12-27 MED ORDER — ACETAMINOPHEN 325 MG PO TABS
650.0000 mg | ORAL_TABLET | ORAL | Status: DC | PRN
  Administered 2016-12-29: 650 mg via ORAL
  Filled 2016-12-27: qty 2

## 2016-12-27 MED ORDER — DEXTROSE 5 % IV SOLN
500.0000 mg | Freq: Four times a day (QID) | INTRAVENOUS | Status: DC | PRN
  Filled 2016-12-27: qty 5

## 2016-12-27 MED ORDER — SIMVASTATIN 20 MG PO TABS
20.0000 mg | ORAL_TABLET | Freq: Every day | ORAL | Status: DC
  Administered 2016-12-27 – 2016-12-28 (×2): 20 mg via ORAL
  Filled 2016-12-27 (×2): qty 1

## 2016-12-27 MED ORDER — GLYCOPYRROLATE 0.2 MG/ML IJ SOLN
INTRAMUSCULAR | Status: AC
  Filled 2016-12-27: qty 1

## 2016-12-27 MED ORDER — HYDROMORPHONE HCL 1 MG/ML IJ SOLN
0.5000 mg | INTRAMUSCULAR | Status: DC | PRN
  Administered 2016-12-28: 0.5 mg via INTRAVENOUS
  Filled 2016-12-27: qty 1

## 2016-12-27 MED ORDER — HYDROXYZINE HCL 25 MG PO TABS
25.0000 mg | ORAL_TABLET | Freq: Every day | ORAL | Status: DC | PRN
  Filled 2016-12-27: qty 1

## 2016-12-27 MED ORDER — ACETAMINOPHEN 10 MG/ML IV SOLN
INTRAVENOUS | Status: AC
  Filled 2016-12-27: qty 100

## 2016-12-27 MED ORDER — DIPHENHYDRAMINE HCL 25 MG PO CAPS: 25.0000 mg | ORAL_CAPSULE | Freq: Every evening | ORAL | Status: DC | PRN

## 2016-12-27 MED ORDER — ACETAMINOPHEN 500 MG PO TABS
1000.0000 mg | ORAL_TABLET | Freq: Three times a day (TID) | ORAL | Status: AC
  Administered 2016-12-27 – 2016-12-28 (×3): 1000 mg via ORAL
  Filled 2016-12-27 (×3): qty 2

## 2016-12-27 MED ORDER — BUPIVACAINE-EPINEPHRINE (PF) 0.25% -1:200000 IJ SOLN
INTRAMUSCULAR | Status: DC | PRN
  Administered 2016-12-27: 30 mL

## 2016-12-27 MED ORDER — SEVOFLURANE IN SOLN
RESPIRATORY_TRACT | Status: AC
  Filled 2016-12-27: qty 250

## 2016-12-27 MED ORDER — ACETAMINOPHEN 650 MG RE SUPP: 650.0000 mg | RECTAL | Status: DC | PRN

## 2016-12-27 MED ORDER — CEFAZOLIN SODIUM 1 G IJ SOLR
INTRAMUSCULAR | Status: AC
  Filled 2016-12-27: qty 20

## 2016-12-27 MED ORDER — EPHEDRINE SULFATE 50 MG/ML IJ SOLN
INTRAMUSCULAR | Status: AC
  Filled 2016-12-27: qty 1

## 2016-12-27 MED ORDER — BUPIVACAINE LIPOSOME 1.3 % IJ SUSP
INTRAMUSCULAR | Status: DC | PRN
  Administered 2016-12-27: 20 mL

## 2016-12-27 MED ORDER — CALCIUM CITRATE-VITAMIN D 315-200 MG-UNIT PO TABS: 2.0000 | ORAL_TABLET | Freq: Every day | ORAL | Status: DC

## 2016-12-27 MED ORDER — ALUM & MAG HYDROXIDE-SIMETH 200-200-20 MG/5ML PO SUSP: 30.0000 mL | ORAL | Status: DC | PRN

## 2016-12-27 MED ORDER — CEFAZOLIN SODIUM-DEXTROSE 2-4 GM/100ML-% IV SOLN
INTRAVENOUS | Status: AC
  Filled 2016-12-27: qty 100

## 2016-12-27 MED ORDER — NEOMYCIN-POLYMYXIN B GU 40-200000 IR SOLN
Status: DC | PRN
  Administered 2016-12-27: 16 mL

## 2016-12-27 MED ORDER — SODIUM CHLORIDE 0.9 % IV SOLN
INTRAVENOUS | Status: DC
  Administered 2016-12-27 – 2016-12-28 (×2): via INTRAVENOUS

## 2016-12-27 MED ORDER — ROPIVACAINE HCL 5 MG/ML IJ SOLN
INTRAMUSCULAR | Status: DC | PRN
  Administered 2016-12-27: 20 mL via EPIDURAL

## 2016-12-27 MED ORDER — PROPOFOL 10 MG/ML IV BOLUS
INTRAVENOUS | Status: AC
  Filled 2016-12-27: qty 40

## 2016-12-27 MED ORDER — OXYCODONE HCL 5 MG PO TABS
10.0000 mg | ORAL_TABLET | ORAL | Status: DC | PRN
  Administered 2016-12-27 – 2016-12-29 (×8): 10 mg via ORAL
  Filled 2016-12-27 (×8): qty 2

## 2016-12-27 MED ORDER — PANTOPRAZOLE SODIUM 40 MG PO TBEC
40.0000 mg | DELAYED_RELEASE_TABLET | Freq: Every day | ORAL | Status: DC
  Administered 2016-12-28: 40 mg via ORAL
  Filled 2016-12-27: qty 1

## 2016-12-27 MED ORDER — DEXAMETHASONE SODIUM PHOSPHATE 10 MG/ML IJ SOLN
INTRAMUSCULAR | Status: AC
  Filled 2016-12-27: qty 1

## 2016-12-27 MED ORDER — ASPIRIN EC 325 MG PO TBEC
325.0000 mg | DELAYED_RELEASE_TABLET | Freq: Every day | ORAL | Status: DC
  Administered 2016-12-28: 325 mg via ORAL
  Filled 2016-12-27: qty 1

## 2016-12-27 MED ORDER — MENTHOL 3 MG MT LOZG
1.0000 | LOZENGE | OROMUCOSAL | Status: DC | PRN
Start: 2016-12-27 — End: 2016-12-29
  Filled 2016-12-27: qty 9

## 2016-12-27 MED ORDER — OXYCODONE HCL 5 MG PO TABS: 5.0000 mg | ORAL_TABLET | ORAL | Status: DC | PRN

## 2016-12-27 MED ORDER — FENTANYL CITRATE (PF) 100 MCG/2ML IJ SOLN
INTRAMUSCULAR | Status: AC
  Administered 2016-12-27: 50 ug via INTRAVENOUS
  Filled 2016-12-27: qty 2

## 2016-12-27 MED ORDER — MIDAZOLAM HCL 2 MG/2ML IJ SOLN
INTRAMUSCULAR | Status: AC
  Administered 2016-12-27: 1 mg via INTRAVENOUS
  Filled 2016-12-27: qty 2

## 2016-12-27 MED ORDER — BISACODYL 10 MG RE SUPP: 10.0000 mg | Freq: Every day | RECTAL | Status: DC | PRN

## 2016-12-27 MED ORDER — ROPIVACAINE HCL 5 MG/ML IJ SOLN
INTRAMUSCULAR | Status: AC
  Filled 2016-12-27: qty 30

## 2016-12-27 MED ORDER — FENTANYL CITRATE (PF) 100 MCG/2ML IJ SOLN: 25.0000 ug | INTRAMUSCULAR | Status: DC | PRN

## 2016-12-27 MED ORDER — PHENYLEPHRINE HCL 10 MG/ML IJ SOLN
INTRAMUSCULAR | Status: AC
  Filled 2016-12-27: qty 1

## 2016-12-27 MED ORDER — DIPHENHYDRAMINE HCL 12.5 MG/5ML PO ELIX: 12.5000 mg | ORAL_SOLUTION | ORAL | Status: DC | PRN

## 2016-12-27 MED ORDER — ONDANSETRON HCL 4 MG/2ML IJ SOLN: 4.0000 mg | Freq: Four times a day (QID) | INTRAMUSCULAR | Status: DC | PRN

## 2016-12-27 MED ORDER — ONDANSETRON HCL 4 MG PO TABS: 4.0000 mg | ORAL_TABLET | Freq: Four times a day (QID) | ORAL | Status: DC | PRN

## 2016-12-27 MED ORDER — ACETAMINOPHEN 500 MG PO TABS
500.0000 mg | ORAL_TABLET | Freq: Every evening | ORAL | Status: DC | PRN
  Filled 2016-12-27: qty 1

## 2016-12-27 MED ORDER — MIDAZOLAM HCL 2 MG/2ML IJ SOLN
1.0000 mg | Freq: Once | INTRAMUSCULAR | Status: AC
  Administered 2016-12-27: 1 mg via INTRAVENOUS

## 2016-12-27 MED ORDER — PROPOFOL 10 MG/ML IV BOLUS
INTRAVENOUS | Status: DC | PRN
  Administered 2016-12-27: 25 mg via INTRAVENOUS
  Administered 2016-12-27: 140 mg via INTRAVENOUS

## 2016-12-27 MED ORDER — FENTANYL CITRATE (PF) 250 MCG/5ML IJ SOLN
INTRAMUSCULAR | Status: AC
  Filled 2016-12-27: qty 5

## 2016-12-27 MED ORDER — LIDOCAINE HCL (PF) 2 % IJ SOLN
INTRAMUSCULAR | Status: AC
  Filled 2016-12-27: qty 10

## 2016-12-27 MED ORDER — VANCOMYCIN HCL 1000 MG IV SOLR
INTRAVENOUS | Status: AC
  Filled 2016-12-27: qty 1000

## 2016-12-27 MED ORDER — BUPIVACAINE-EPINEPHRINE (PF) 0.25% -1:200000 IJ SOLN
INTRAMUSCULAR | Status: AC
  Filled 2016-12-27: qty 30

## 2016-12-27 MED ORDER — CEFAZOLIN SODIUM-DEXTROSE 2-4 GM/100ML-% IV SOLN
2.0000 g | Freq: Four times a day (QID) | INTRAVENOUS | Status: DC
  Filled 2016-12-27 (×3): qty 100

## 2016-12-27 MED ORDER — VANCOMYCIN HCL 1000 MG IV SOLR
INTRAVENOUS | Status: DC | PRN
  Administered 2016-12-27: 1000 mg via TOPICAL

## 2016-12-27 MED ORDER — TRANEXAMIC ACID 1000 MG/10ML IV SOLN
1000.0000 mg | Freq: Once | INTRAVENOUS | Status: AC
  Administered 2016-12-27: 1000 mg via INTRAVENOUS
  Filled 2016-12-27: qty 10

## 2016-12-27 MED ORDER — PHENYLEPHRINE HCL 10 MG/ML IJ SOLN
INTRAMUSCULAR | Status: DC | PRN
  Administered 2016-12-27: 100 ug via INTRAVENOUS
  Administered 2016-12-27: 200 ug via INTRAVENOUS
  Administered 2016-12-27 (×4): 100 ug via INTRAVENOUS

## 2016-12-27 MED ORDER — ALBUMIN HUMAN 5 % IV SOLN
INTRAVENOUS | Status: DC | PRN
  Administered 2016-12-27: 13:00:00 via INTRAVENOUS

## 2016-12-27 MED ORDER — SUCCINYLCHOLINE CHLORIDE 20 MG/ML IJ SOLN
INTRAMUSCULAR | Status: AC
  Filled 2016-12-27: qty 1

## 2016-12-27 MED ORDER — VITAMIN D 1000 UNITS PO TABS
2000.0000 [IU] | ORAL_TABLET | Freq: Every day | ORAL | Status: DC
Start: 2016-12-27 — End: 2016-12-29
  Administered 2016-12-28: 2000 [IU] via ORAL
  Filled 2016-12-27: qty 2

## 2016-12-27 MED ORDER — BUSPIRONE HCL 10 MG PO TABS
10.0000 mg | ORAL_TABLET | Freq: Two times a day (BID) | ORAL | Status: DC
  Administered 2016-12-27 – 2016-12-28 (×3): 10 mg via ORAL
  Filled 2016-12-27 (×3): qty 1

## 2016-12-27 MED ORDER — SUCCINYLCHOLINE CHLORIDE 20 MG/ML IJ SOLN
INTRAMUSCULAR | Status: DC | PRN
  Administered 2016-12-27: 80 mg via INTRAVENOUS

## 2016-12-27 MED ORDER — BUPIVACAINE LIPOSOME 1.3 % IJ SUSP
INTRAMUSCULAR | Status: AC
  Filled 2016-12-27: qty 20

## 2016-12-27 MED ORDER — CALCIUM CITRATE-VITAMIN D 500-500 MG-UNIT PO CHEW
1.0000 | CHEWABLE_TABLET | Freq: Every day | ORAL | Status: DC
  Administered 2016-12-28: 11:00:00 1 via ORAL
  Filled 2016-12-27 (×2): qty 1

## 2016-12-27 MED ORDER — LIDOCAINE HCL (CARDIAC) 20 MG/ML IV SOLN
INTRAVENOUS | Status: DC | PRN
  Administered 2016-12-27: 100 mg via INTRAVENOUS

## 2016-12-27 MED ORDER — NEOMYCIN-POLYMYXIN B GU 40-200000 IR SOLN
Status: AC
  Filled 2016-12-27: qty 20

## 2016-12-27 MED ORDER — ACETAMINOPHEN 10 MG/ML IV SOLN
INTRAVENOUS | Status: DC | PRN
Start: 2016-12-27 — End: 2016-12-27
  Administered 2016-12-27: 1000 mg via INTRAVENOUS

## 2016-12-27 MED ORDER — ADULT MULTIVITAMIN W/MINERALS CH
1.0000 | ORAL_TABLET | Freq: Every day | ORAL | Status: DC
Start: 2016-12-27 — End: 2016-12-29
  Administered 2016-12-28: 1 via ORAL
  Filled 2016-12-27: qty 1

## 2016-12-27 MED ORDER — FENTANYL CITRATE (PF) 100 MCG/2ML IJ SOLN
INTRAMUSCULAR | Status: DC | PRN
  Administered 2016-12-27: 50 ug via INTRAVENOUS
  Administered 2016-12-27: 25 ug via INTRAVENOUS
  Administered 2016-12-27: 50 ug via INTRAVENOUS
  Administered 2016-12-27 (×3): 25 ug via INTRAVENOUS
  Administered 2016-12-27: 50 ug via INTRAVENOUS

## 2016-12-27 MED ORDER — METOCLOPRAMIDE HCL 10 MG PO TABS: 5.0000 mg | ORAL_TABLET | Freq: Three times a day (TID) | ORAL | Status: DC | PRN

## 2016-12-27 MED ORDER — PHENOL 1.4 % MT LIQD
1.0000 | OROMUCOSAL | Status: DC | PRN
  Filled 2016-12-27: qty 177

## 2016-12-27 MED ORDER — SODIUM CHLORIDE 0.9 % IV SOLN
INTRAVENOUS | Status: DC | PRN
  Administered 2016-12-27: 30 ug/min via INTRAVENOUS

## 2016-12-27 MED ORDER — GLYCOPYRROLATE 0.2 MG/ML IJ SOLN
INTRAMUSCULAR | Status: DC | PRN
  Administered 2016-12-27 (×2): 0.1 mg via INTRAVENOUS

## 2016-12-27 MED ORDER — SENNOSIDES-DOCUSATE SODIUM 8.6-50 MG PO TABS: 1.0000 | ORAL_TABLET | Freq: Every evening | ORAL | Status: DC | PRN

## 2016-12-27 MED ORDER — EPHEDRINE SULFATE 50 MG/ML IJ SOLN
INTRAMUSCULAR | Status: DC | PRN
  Administered 2016-12-27 (×4): 5 mg via INTRAVENOUS

## 2016-12-27 MED ORDER — ONDANSETRON HCL 4 MG/2ML IJ SOLN
INTRAMUSCULAR | Status: AC
  Filled 2016-12-27: qty 2

## 2016-12-27 MED ORDER — ONDANSETRON HCL 4 MG/2ML IJ SOLN
4.0000 mg | Freq: Once | INTRAMUSCULAR | Status: AC | PRN
  Administered 2016-12-27: 4 mg via INTRAVENOUS

## 2016-12-27 MED ORDER — DEXAMETHASONE SODIUM PHOSPHATE 10 MG/ML IJ SOLN
INTRAMUSCULAR | Status: DC | PRN
  Administered 2016-12-27: 5 mg via INTRAVENOUS

## 2016-12-27 MED ORDER — CEFAZOLIN SODIUM-DEXTROSE 2-4 GM/100ML-% IV SOLN
2.0000 g | Freq: Four times a day (QID) | INTRAVENOUS | Status: DC
  Filled 2016-12-27: qty 100

## 2016-12-27 MED ORDER — MIRTAZAPINE 15 MG PO TABS
30.0000 mg | ORAL_TABLET | Freq: Every day | ORAL | Status: DC
  Administered 2016-12-27 – 2016-12-28 (×2): 30 mg via ORAL
  Filled 2016-12-27 (×2): qty 2

## 2016-12-27 MED ORDER — ALBUMIN HUMAN 5 % IV SOLN
INTRAVENOUS | Status: AC
  Filled 2016-12-27: qty 250

## 2016-12-27 SURGICAL SUPPLY — 89 items
BASEPLATE P2 COATD GLND 6.5X30 (Shoulder) ×1 IMPLANT
BIT DRILL GUIDE PATIENT MATCH (MISCELLANEOUS) ×1 IMPLANT
BLADE SAGITTAL WIDE XTHICK NO (BLADE) ×2 IMPLANT
CANISTER SUCT 1200ML W/VALVE (MISCELLANEOUS) ×2 IMPLANT
CANISTER SUCT 3000ML PPV (MISCELLANEOUS) ×4 IMPLANT
CEMENT BONE GENTAMICIN (Cement) ×4 IMPLANT
CEMENT BONE GENTAMICIN PWDR (Cement) ×2 IMPLANT
CHLORAPREP W/TINT 26ML (MISCELLANEOUS) ×2 IMPLANT
CNTNR SPEC 2.5X3XGRAD LEK (MISCELLANEOUS) ×1
CONT SPEC 4OZ STER OR WHT (MISCELLANEOUS) ×1
CONTAINER SPEC 2.5X3XGRAD LEK (MISCELLANEOUS) ×1 IMPLANT
COOLER POLAR GLACIER W/PUMP (MISCELLANEOUS) ×2 IMPLANT
COUNTER NEEDLE 20/40 LG (NEEDLE) ×2 IMPLANT
DRAPE C-ARM XRAY 36X54 (DRAPES) ×2 IMPLANT
DRAPE IMP U-DRAPE 54X76 (DRAPES) ×4 IMPLANT
DRAPE INCISE IOBAN 66X45 STRL (DRAPES) ×4 IMPLANT
DRAPE SHEET LG 3/4 BI-LAMINATE (DRAPES) ×6 IMPLANT
DRAPE TABLE BACK 80X90 (DRAPES) ×2 IMPLANT
DRAPE U-SHAPE 47X51 STRL (DRAPES) ×2 IMPLANT
DRILL GUIDE PATIENT MATCH (MISCELLANEOUS) ×2
DRSG OPSITE POSTOP 4X10 (GAUZE/BANDAGES/DRESSINGS) ×2 IMPLANT
DRSG OPSITE POSTOP 4X8 (GAUZE/BANDAGES/DRESSINGS) ×2 IMPLANT
DRSG TEGADERM 2-3/8X2-3/4 SM (GAUZE/BANDAGES/DRESSINGS) ×2 IMPLANT
ELECT BLADE 6.5 EXT (BLADE) ×2 IMPLANT
ELECT CAUTERY BLADE 6.4 (BLADE) ×2 IMPLANT
ELECT REM PT RETURN 9FT ADLT (ELECTROSURGICAL) ×2
ELECTRODE REM PT RTRN 9FT ADLT (ELECTROSURGICAL) ×1 IMPLANT
EVACUATOR 1/8 PVC DRAIN (DRAIN) ×2 IMPLANT
GAUZE PETRO XEROFOAM 1X8 (MISCELLANEOUS) ×2 IMPLANT
GAUZE SPONGE NON-WVN 2X2 STRL (MISCELLANEOUS) ×1 IMPLANT
GAUZE XEROFORM 4X4 STRL (GAUZE/BANDAGES/DRESSINGS) ×2 IMPLANT
GLOVE BIO SURGEON STRL SZ7.5 (GLOVE) ×2 IMPLANT
GLOVE BIOGEL PI IND STRL 8 (GLOVE) ×1 IMPLANT
GLOVE BIOGEL PI INDICATOR 8 (GLOVE) ×1
GLOVE SURG ORTHO 8.0 STRL STRW (GLOVE) ×4 IMPLANT
GOWN STRL REUS W/ TWL LRG LVL3 (GOWN DISPOSABLE) ×2 IMPLANT
GOWN STRL REUS W/ TWL XL LVL3 (GOWN DISPOSABLE) ×1 IMPLANT
GOWN STRL REUS W/TWL LRG LVL3 (GOWN DISPOSABLE) ×2
GOWN STRL REUS W/TWL XL LVL3 (GOWN DISPOSABLE) ×1
HEAD GLENOID W/SCREW 32MM (Shoulder) ×2 IMPLANT
HOOD PEEL AWAY FLYTE STAYCOOL (MISCELLANEOUS) ×10 IMPLANT
INSERT SMALL SOCKET 36MM NEU (Insert) ×2 IMPLANT
KIT STABILIZATION SHOULDER (MISCELLANEOUS) ×2 IMPLANT
MASK FACE SPIDER DISP (MASK) ×2 IMPLANT
MAT BLUE FLOOR 46X72 FLO (MISCELLANEOUS) ×2 IMPLANT
NDL SAFETY ECLIPSE 18X1.5 (NEEDLE) ×3 IMPLANT
NEEDLE HYPO 18GX1.5 SHARP (NEEDLE) ×3
NEEDLE REVERSE CUT 1/2 CRC (NEEDLE) ×2 IMPLANT
NEEDLE SPNL 20GX3.5 QUINCKE YW (NEEDLE) ×2 IMPLANT
NS IRRIG 1000ML POUR BTL (IV SOLUTION) ×2 IMPLANT
P2 COATDE GLNOID BSEPLT 6.5X30 (Shoulder) ×2 IMPLANT
PACK ARTHROSCOPY SHOULDER (MISCELLANEOUS) ×2 IMPLANT
PAD WRAPON POLAR SHDR UNIV (MISCELLANEOUS) ×1 IMPLANT
PLUG CEMENT CLEARCUT 8MM (Cement) ×2 IMPLANT
PULSAVAC PLUS IRRIG FAN TIP (DISPOSABLE) ×2
SCREW BONE LOCKING RSP 5.0X14 (Screw) ×2 IMPLANT
SCREW BONE LOCKING RSP 5.0X30 (Screw) ×2 IMPLANT
SCREW BONE RSP LOCK 5X14 (Screw) ×1 IMPLANT
SCREW BONE RSP LOCK 5X26 (Screw) ×1 IMPLANT
SCREW BONE RSP LOCK 5X30 (Screw) ×1 IMPLANT
SCREW BONE RSP LOCKING 5.0X26 (Screw) ×2 IMPLANT
SLING ULTRA II LG (MISCELLANEOUS) ×2 IMPLANT
SLING ULTRA II M (MISCELLANEOUS) ×2 IMPLANT
SPACER SMALL 8MM W 8MM SCREW (Spacer) ×2 IMPLANT
SPONGE LAP 18X18 5 PK (GAUZE/BANDAGES/DRESSINGS) ×8 IMPLANT
SPONGE VERSALON 2X2 STRL (MISCELLANEOUS) ×1
STAPLER SKIN PROX 35W (STAPLE) ×2 IMPLANT
STEM HUMERAL REV SHL 6X108 SM (Stem) ×2 IMPLANT
STRAP SAFETY BODY (MISCELLANEOUS) ×4 IMPLANT
STRIP CLOSURE SKIN 1/2X4 (GAUZE/BANDAGES/DRESSINGS) ×4 IMPLANT
SUT ETHIBOND 5-0 MS/4 CCS GRN (SUTURE) ×4
SUT ETHIBOND NAB CT1 #1 30IN (SUTURE) ×6 IMPLANT
SUT FIBERWIRE #2 38 BLUE 1/2 (SUTURE) ×14
SUT MNCRL AB 4-0 PS2 18 (SUTURE) IMPLANT
SUT TICRON 2-0 30IN 311381 (SUTURE) ×6 IMPLANT
SUT VIC AB 0 CT1 36 (SUTURE) ×2 IMPLANT
SUT VIC AB 2-0 CT2 27 (SUTURE) ×4 IMPLANT
SUTURE ETHBND 5-0 MS/4 CCS GRN (SUTURE) ×2 IMPLANT
SUTURE FIBERWR #2 38 BLUE 1/2 (SUTURE) ×7 IMPLANT
SUTURE TAPE 1.3 40 TPR END (SUTURE) ×4 IMPLANT
SUTURETAPE 1.3 40 TPR END (SUTURE) ×8
SYR 20CC LL (SYRINGE) ×2 IMPLANT
SYR 30ML LL (SYRINGE) ×4 IMPLANT
SYR 5ML LL (SYRINGE) ×2 IMPLANT
TIP FAN IRRIG PULSAVAC PLUS (DISPOSABLE) ×1 IMPLANT
TRAY FOLEY CATH SILVER 16FR LF (SET/KITS/TRAYS/PACK) ×2 IMPLANT
WIRE CERLCAGE 1.25 280 (WIRE) ×4 IMPLANT
WRAPON POLAR PAD SHDR UNIV (MISCELLANEOUS) ×2
cerclage wire (Wire) ×4 IMPLANT

## 2016-12-27 NOTE — Anesthesia Procedure Notes (Signed)
Anesthesia Regional Block: Interscalene brachial plexus block   Pre-Anesthetic Checklist: ,, timeout performed, Correct Patient, Correct Site, Correct Laterality, Correct Procedure, Correct Position, site marked, Risks and benefits discussed,  Surgical consent,  Pre-op evaluation,  At surgeon's request and post-op pain management   Prep: Betadine       Needles:  Injection technique: Single-shot  Needle Type: Echogenic Stimulator Needle     Needle Length: 5cm  Needle Gauge: 21     Additional Needles:   Procedures:, nerve stimulator,,,,,,,   Nerve Stimulator or Paresthesia:  Response: biceps flexion, 0.8 mA,   Additional Responses:   Narrative:  Injection made incrementally with aspirations every 5 mL.  Performed by: Personally  Anesthesiologist: Gunnar Bulla, MD  Additional Notes: Functioning IV was confirmed and monitors were applied.  A 5mm 22ga Arrow echogenic stimulator needle was used. Sterile prep and drape,hand hygiene and sterile gloves were used.  Negative aspiration and negative test dose prior to incremental administration of local anesthetic. The patient tolerated the procedure well.  Ultrasound guidance: relevent anatomy identified, needle position confirmed, local anesthetic spread visualized around nerve(s), vascular puncture avoided.  Image printed for medical record. At [place].

## 2016-12-27 NOTE — Anesthesia Post-op Follow-up Note (Signed)
Anesthesia QCDR form completed.        

## 2016-12-27 NOTE — Anesthesia Procedure Notes (Signed)
Procedure Name: Intubation Date/Time: 12/27/2016 8:05 AM Performed by: Zetta Bills, CRNA Pre-anesthesia Checklist: Patient identified, Patient being monitored, Timeout performed, Emergency Drugs available and Suction available Patient Re-evaluated:Patient Re-evaluated prior to induction Oxygen Delivery Method: Circle system utilized Preoxygenation: Pre-oxygenation with 100% oxygen Induction Type: IV induction Ventilation: Mask ventilation without difficulty Laryngoscope Size: Mac and 3 Grade View: Grade I Tube type: Oral Tube size: 7.0 mm Number of attempts: 1 Airway Equipment and Method: Stylet Placement Confirmation: ETT inserted through vocal cords under direct vision,  positive ETCO2 and breath sounds checked- equal and bilateral Secured at: 21 cm Tube secured with: Tape Dental Injury: Teeth and Oropharynx as per pre-operative assessment

## 2016-12-27 NOTE — Op Note (Signed)
Operative Note   SURGERY DATE: 12/27/2016  PRE-OP DIAGNOSIS:  1. Left proximal humerus hardware failure 2. Left proximal humerus fracture  POST-OP DIAGNOSIS: 1. Left proximal humerus hardware failure 2. Left proximal humerus fracture  PROCEDURES:  1. Left reverse total shoulder arthroplasty 2. Removal of hardware from Left proximal humerus  SURGEON: Cato Mulligan, MD  ASSISTANTS: Reche Dixon, PA  ANESTHESIA: Gen + interscalene block  ESTIMATED BLOOD LOSS: 250cc  TOTAL IV FLUIDS: see anesthesia record  IMPLANTS: [facility] Surgical: RSP Glenoid Head w/Retaining screw 36 Neutral; Monoblock Reverse Shoulder Baseplate with 3.2RJ central screw; 3 locking screws into baseplate (18AC posterior, 31m superior, 238minferior); Small Shell Humeral Stem 6 x10884mNeutral Small Socket Insert; 8mm28macer with screw Bio-absorbable cement restrictor (8mm)55mINDICATION(S):  Chelsey [name] 66 y.64 female with chronic shoulder pain.  She underwent ORIF L proximal humerus on 11/23/16. At her first post-operative visit, she was found to have collapse of her humeral head back into varus on radiographs. After discussion of risks, benefits, and alternatives to surgery, the patient elected to proceed with surgery in the form of L reverse shoulder arthroplasty and removal of proximal humerus hardware.   OPERATIVE FINDINGS: Varus collapse of humeral head with failure of hardware  OPERATIVE REPORT:  I identified Chelsey [name] the pre-operative holding area. Informed consent was obtained and the surgical site was marked. I reviewed the risks and benefits of the proposed surgical intervention and the patient (and/or patient's guardian) wished to proceed. An interscalene block was administered by the Anesthesia team. The patient was transferred to the operative suite and general anesthesia was administered. The patient was placed in the beach chair position with the head of the bed elevated  approximately 45 degrees. All down side pressure points were appropriately padded. Pre-op exam under anesthesia confirmed some stiffness and crepitus. Appropriate IV antibioticswere administered within 30 minutes before incision. Tranexamic acid was also administered after verifying that the patient had no contraindications. The extremity was then prepped and draped in standard fashion. A time out was performed confirming the correct extremity, correct patient, and correct procedure.   We used the previous deltopectoral incision from the coracoid to ~12cm distal. . We opened the deltopectoral interval and significant clear blood-tinged fluid was released. Culture samples of this were taken as a precaution. The proximal humerus was exposed bluntly and the humeral head was found to be collapsed into varus with loose locking screws in the humeral head. All hardware was removed from the shaft and humeral head including the screws and plate. Early callus was excised and screw holes were curetted. Vancomycin powder was placed.  The prior soft tissue biceps tenodesis was intact. The lesser tuberosity was attached the humeral head and an osteotomy of the lesser tuberosity was performed. An additional osteotomy of the superior greater tuberosity was performed given that there was still rotator cuff attached to this part of humeral head. The humeral head was then excised with a Cobb and corkscrew.   Two #1 Ethibond sutures were placed around the lesser tuberosity. Two #5 Ethibond sutures were placed around the osteotomized greater tuberosity fragment. An additional SutureTape and #2 Fiberwire were placed around the greater tuberosity to serve as cerclage and tuberosity to implant fin sutures. There was a larger piece of inferior and posterior greater tuberosity present as well. Sutures were placed around this fragment in a similar fashion.   The anterior capsule was dissected free from the subscapularis. The anterior  capsule  was then excised, exposing the anterior glenoid. We then grasped the labrum and removed it circumferentially. During the glenoid exposure, the axillary nerve was protected the entire time.  We circumferentially released the capsule off of the glenoid and placed appropriate retractors to gain adequate visualization.  A patient-specific guide was used to drill the central guidepin. A tap was placed, matching the trajectory of the guidepin. An appropriately sized reamer was used to ream the glenoid. The monoblock baseplate was inserted and excellent fixation was achieved such that the entire scapula rotated with further attempted seating of the baseplate. The 3 peripheral screws were drilled, measured, and placed. The anterior screw-hole had very little bony purchase so no screw was placed. A 36N glenosphere trial was then placed.   We then turned our attention to the humerus. We sized for a small-shell prosthesis. We sequentially used larger diameter canal finders until we met some resistance at a size 8. We broached to a size 8 at ~25 degrees of retroversion. We placed +4 poly trial. The humerus was trialed and noted to have satisfactory stability, motion, and deltoid tension with adequate tuberosity reduction. We drilled two holes into the shaft on either side of the bicipital groove ~2cm inferior to the humeral fracture line and passed  two SutureTapes through for the vertical tuberosity repair sutures. An 75m cement restrictor was placed at the appropriate depth.   Next, both cerclage SutureTapes from the greater tuberosity fragments were passed medially through the humeral stem. The superior greater tuberosity fragment was passed through the superior fin. The inferior greater tuberosity fragment was passed through the inferior fin. The wound and humeral canal were thoroughly irrigated with pulse lavage. The canal was dried. Pressurized cement was inserted into the humeral canal. The proximal 2cm of  cement were removed. Morselized bone graft was then placed where the cement was removed. Fingers were placed around the shaft to prevent excessive cement extravasation from the cortical screws from the removed plate. The stem was then inserted into the appropriate position based on the trial.  Excess cement was removed. The stem was held in place until the cement hardened. During seating of the implant, a small vertical fracture line was noted on the lateral aspect of the shaft from the proximal fracture site extending distally through the 1st cortical screw hole and into the 2nd cortical screw hole. There was no further fracture line distally or in any other plane. This was also confirmed fluoroscopically. Therefore, two metal cerclage wires were placed around the proximal shaft to prevent any further propogation.  We trialed both with a +4 poly and an 896mspacer. There was improved stability, appropriate tension and RoM with an 23m723mpacer and 0 poly. The trial glenosphere was replaced with the actual 36N implant and screw was placed to secure it in place. Two additional FiberWire sutures were placed through the implant shell. The 23mm59macer and 0 poly were placed. Shoulder was reduced.   Horizontal #5 Ethibond sutures were passed through the subscapularis at the bone-tendon junction. Both cerclage SutureTapes were also passed just medially to the #5 Ethibond sutures. One of each of the vertical SutureTapse from the diaphysis was passed through each greater tuberosity fragment. The reduced position of the tuberosities was confirmed fluoroscopically. The two FiberWires in the shell were passed through the anterior supraspinatus and tied after holding the tuberosity in a reduced position. Next the tuberosity to fin sutures were tied, followed by horizontal and cerclage sutures. Lastly, the vertical sutures  were tied.   Final confirmation of motion, tension, and stability were satisfactory. The shoulder and  tuberosity fragments moved as one unit. Fluoroscopy confirmed appropriate hardware and bony position. The wound was thoroughly irrigated. Vancomycin powder was placed. A combination of Sensorcaine and Exparil was injected into the deltopectoral interval and subcutaneous tissue. A Hemovac drain was placed.   We closed the deltopectoral interval with a running, 0-Vicryl suture. The skin was closed with 2-0 Vicryl and staples. Xeroform and Honeycomb dressing were applied. A PolarCare unit and sling were placed. Patient was extubated, transferred to a stretcher bed and to the post anesthesia care unit in stable condition.   Of note, services of a PA were essential to performing the surgery. PA was able to assist in patient positioning, exposure, retraction, and suturing the wound.    POSTOPERATIVE PLAN: The patient will be admitted with plan for discharge home on POD#1. Operative arm to remain in sling and abduction pillow with arm at neutral at all times except RoM exercises and hygiene. Can perform pendulums, elbow/wrist/hand RoM exercises. Passive RoM allowed to 90 FF and 30 ER. ASA 368m x 6 weeks for DVT ppx. Plan for outpatient PT starting on POD #3-4. Patient to return to clinic in ~2 weeks for post-operative appointment.

## 2016-12-27 NOTE — Transfer of Care (Signed)
Immediate Anesthesia Transfer of Care Note  Patient: Chelsey [name]  Procedure(s) Performed: REVERSE SHOULDER ARTHROPLASTY (Left ) HARDWARE REMOVAL LEFT  PROXIMAL HUMEROUS (Left )  Patient Location: PACU  Anesthesia Type:GA combined with regional for post-op pain  Level of Consciousness: awake  Airway & Oxygen Therapy: Patient Spontanous Breathing and Patient connected to face mask oxygen  Post-op Assessment: Report given to RN and Post -op Vital signs reviewed and stable  Post vital signs: Reviewed  Last Vitals:  Vitals:   12/27/16 0738 12/27/16 1544  BP: 101/72 130/66  Pulse: 69   Resp: (!) 7 12  Temp:    SpO2: 100% 99%    Last Pain:  Vitals:   12/27/16 0718  TempSrc:   PainSc: Asleep         Complications: No apparent anesthesia complications

## 2016-12-27 NOTE — Anesthesia Preprocedure Evaluation (Signed)
Anesthesia Evaluation  Patient identified by MRN, date of birth, ID band Patient awake    Reviewed: Allergy & Precautions, NPO status , Patient's Chart, lab work & pertinent test results, reviewed documented beta blocker date and time   Airway Mallampati: III  TM Distance: >3 FB     Dental  (+) Chipped   Pulmonary Current Smoker,           Cardiovascular      Neuro/Psych PSYCHIATRIC DISORDERS Anxiety Depression    GI/Hepatic GERD  Controlled,  Endo/Other    Renal/GU      Musculoskeletal   Abdominal   Peds  Hematology   Anesthesia Other Findings   Reproductive/Obstetrics                             Anesthesia Physical Anesthesia Plan  ASA: III  Anesthesia Plan: General   Post-op Pain Management:  Regional for Post-op pain   Induction: Intravenous  PONV Risk Score and Plan:   Airway Management Planned: Oral ETT  Additional Equipment:   Intra-op Plan:   Post-operative Plan:   Informed Consent: I have reviewed the patients History and Physical, chart, labs and discussed the procedure including the risks, benefits and alternatives for the proposed anesthesia with the patient or authorized representative who has indicated his/her understanding and acceptance.     Plan Discussed with: CRNA  Anesthesia Plan Comments:         Anesthesia Quick Evaluation

## 2016-12-27 NOTE — H&P (Signed)
Paper H&P to be scanned into permanent record. H&P reviewed. No significant changes noted.  

## 2016-12-27 NOTE — Anesthesia Postprocedure Evaluation (Signed)
Anesthesia Post Note  Patient: Chelsey [name]  Procedure(s) Performed: REVERSE SHOULDER ARTHROPLASTY (Left ) HARDWARE REMOVAL LEFT  PROXIMAL HUMEROUS (Left )  Patient location during evaluation: PACU Anesthesia Type: Regional Level of consciousness: awake and alert Pain management: pain level controlled Vital Signs Assessment: post-procedure vital signs reviewed and stable Respiratory status: spontaneous breathing, nonlabored ventilation, respiratory function stable and patient connected to nasal cannula oxygen Cardiovascular status: blood pressure returned to baseline and stable Postop Assessment: no apparent nausea or vomiting Anesthetic complications: no     Last Vitals:  Vitals:   12/27/16 1833 12/27/16 1936  BP: (!) 92/46 (!) 97/50  Pulse: 98 94  Resp:  16  Temp: 36.7 C 36.7 C  SpO2: 92% 97%    Last Pain:  Vitals:   12/27/16 1936  TempSrc: Oral  PainSc:                  [place] Osterhout S

## 2016-12-28 LAB — BASIC METABOLIC PANEL
ANION GAP: 6 (ref 5–15)
BUN: 11 mg/dL (ref 6–20)
CALCIUM: 8.3 mg/dL — AB (ref 8.9–10.3)
CO2: 24 mmol/L (ref 22–32)
Chloride: 105 mmol/L (ref 101–111)
Creatinine, Ser: 0.58 mg/dL (ref 0.44–1.00)
Glucose, Bld: 108 mg/dL — ABNORMAL HIGH (ref 65–99)
POTASSIUM: 3.6 mmol/L (ref 3.5–5.1)
SODIUM: 135 mmol/L (ref 135–145)

## 2016-12-28 LAB — CBC
HCT: 29.1 % — ABNORMAL LOW (ref 35.0–47.0)
Hemoglobin: 9.8 g/dL — ABNORMAL LOW (ref 12.0–16.0)
MCH: 29.9 pg (ref 26.0–34.0)
MCHC: 33.7 g/dL (ref 32.0–36.0)
MCV: 88.7 fL (ref 80.0–100.0)
PLATELETS: 273 10*3/uL (ref 150–440)
RBC: 3.28 MIL/uL — ABNORMAL LOW (ref 3.80–5.20)
RDW: 13.6 % (ref 11.5–14.5)
WBC: 8.5 10*3/uL (ref 3.6–11.0)

## 2016-12-28 MED ORDER — OXYCODONE HCL 5 MG PO TABS: 5.0000 mg | ORAL_TABLET | ORAL | 0 refills | Status: DC | PRN

## 2016-12-28 MED ORDER — OXYCODONE HCL 5 MG PO TABS
5.0000 mg | ORAL_TABLET | ORAL | Status: DC | PRN
Start: 2016-12-28 — End: 2016-12-29

## 2016-12-28 MED ORDER — ONDANSETRON HCL 4 MG PO TABS: 4.0000 mg | ORAL_TABLET | Freq: Four times a day (QID) | ORAL | 0 refills | Status: DC | PRN

## 2016-12-28 NOTE — Progress Notes (Signed)
Pt is alert and oriented. Pt able to move fingers without difficulty. Medicated for pain through the night. Surgical dressing dry and intact. Minimal drainage from hemovac. Pt foley patent and draining urine.

## 2016-12-28 NOTE — Evaluation (Signed)
Occupational Therapy Evaluation Patient Details Name: Chelsey [name] MRN: [id number] DOB: January 20, 1951 Today's Date: 12/28/2016    History of Present Illness 66 y.o.female with chronic shoulder pain. She underwent ORIF L proximal humerus on 11/23/16 after a fall. At her first post-operative visit, she was found to have collapse of her humeral head back into varus on radiographs. Pt is now POD#1 for reverse L shoulder arthroplasty with L proximal humerous hardware removal.    Clinical Impression   Patient was evaluated this date by OT. Patient was independent prior to recent L shoulder ORIF including driving and was working as a Development worker, community carrier. Patient has family to assist at home (son works during the day) and sister who lives nearby who has been assisting pt PRN for past 4 weeks since previous shoulder surgery in October. Patient has orders for LUE immobilized and will be NWBing per MD. Patient presents with significant pain, decreased strength/ROM, and decreased ability to perform self care tasks. Pt/sister educated in 1 arm techniques for ADL tasks, LUE precautions and active/passive ROM exercises, polar care mgt, sling mgt, and falls prevention strategies. Handout provided and reviewed. Pt/sister verbalized understanding of all education/training provided. Pt was pain limited for mobility tasks this morning and RN provided pain medications during session. Pending mobility assessment with PT, patient safe to discharge home with follow up therapy as arranged by the surgeon. Will sign off. Please re-consult if additional needs arise.     Follow Up Recommendations  DC plan and follow up therapy as arranged by surgeon    Equipment Recommendations  None recommended by OT    Recommendations for Other Services       Precautions / Restrictions Precautions Precautions: Fall;Shoulder Shoulder Interventions: Shoulder sling/immobilizer;At all times;Off for dressing/bathing/exercises Precaution  Booklet Issued: Yes (comment) Restrictions Weight Bearing Restrictions: Yes LUE Weight Bearing: Non weight bearing      Mobility Bed Mobility Overal bed mobility: Modified Independent             General bed mobility comments: increased time/effort due to pain, no LOB  Transfers Overall transfer level: Needs assistance Equipment used: None Transfers: Sit to/from Stand Sit to Stand: Min guard         General transfer comment: min guard due to increased L shoulder pain and pt noting slightly lightheaded upon initial attempt. Pt/sister educated in minimizing risk of OH, since pt verbalized that she sometimes gets lightheaded upon standing at home.    Balance Overall balance assessment: Needs assistance Sitting-balance support: Single extremity supported;Feet supported Sitting balance-Leahy Scale: Good     Standing balance support: Single extremity supported Standing balance-Leahy Scale: Fair Standing balance comment: fair-                           ADL either performed or assessed with clinical judgement   ADL Overall ADL's : Needs assistance/impaired         Upper Body Bathing: Sitting;Minimal assistance   Lower Body Bathing: Sit to/from stand;Minimal assistance   Upper Body Dressing : Sitting;Minimal assistance;Moderate assistance   Lower Body Dressing: Sit to/from stand;Minimal assistance   Toilet Transfer: Min guard;Ambulation;Regular Toilet             General ADL Comments: pt's sister able to provide needed level of assist, pt/sister educated in shoulder precautions and 1 handed techniques for bathing, grooming, dressing tasks     Vision Baseline Vision/History: Wears glasses Wears Glasses: Reading only Patient Visual Report: No  change from baseline Vision Assessment?: No apparent visual deficits     Perception     Praxis      Pertinent Vitals/Pain Pain Assessment: 0-10 Pain Score: 9  Pain Location: L shoulder Pain  Descriptors / Indicators: Aching;Grimacing;Guarding Pain Intervention(s): Limited activity within patient's tolerance;Monitored during session;RN gave pain meds during session;Ice applied     Hand Dominance Left   Extremity/Trunk Assessment Upper Extremity Assessment Upper Extremity Assessment: LUE deficits/detail(RUE WFL) LUE Deficits / Details: hand/wrist ROM WFL, grip WFL, full PROM of elbow LUE: Unable to fully assess due to immobilization;Unable to fully assess due to pain   Lower Extremity Assessment Lower Extremity Assessment: Defer to PT evaluation;Overall [facility] for tasks assessed   Cervical / Trunk Assessment Cervical / Trunk Assessment: Normal   Communication Communication Communication: No difficulties   Cognition Arousal/Alertness: Awake/alert Behavior During Therapy: WFL for tasks assessed/performed Overall Cognitive Status: Within Functional Limits for tasks assessed                                     General Comments       Exercises Other Exercises Other Exercises: pt/sister educated in PROM of elbow (NO AROM of elbow due to bicep tenodesis procedure, per Dr. Posey Pronto), AROM wrist/hand Other Exercises: pt/sister educated in polar care mgt, compression stocking mgt, and sling mgt   Shoulder Instructions Shoulder Instructions Donning/doffing shirt without moving shoulder: Caregiver independent with task Method for sponge bathing under operated UE: Independent Donning/doffing sling/immobilizer: Caregiver independent with task Correct positioning of sling/immobilizer: Caregiver independent with task ROM for elbow, wrist and digits of operated UE: Modified independent;Caregiver independent with task Sling wearing schedule (on at all times/off for ADL's): Independent Proper positioning of operated UE when showering: Independent Positioning of UE while sleeping: [place] expects to be discharged to:: Private  residence Living Arrangements: Children(son lives with her) Available Help at Discharge: Family;Available PRN/intermittently(son works, sister lives nearby and able to assist prn) Type of Home: House Home Access: Stairs to enter Technical brewer of Steps: 2 Entrance Stairs-Rails: None Home Layout: One level     Bathroom Shower/Tub: Walk-in Hydrologist: Standard     Home Equipment: Bedside commode;Shower seat          Prior Functioning/Environment Level of Independence: Independent        Comments: Prior to shoulder surgery in October, 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         OT Problem List:        OT Treatment/Interventions:      OT Goals(Current goals can be found in the care plan section) Acute Rehab OT Goals Patient Stated Goal: have less pain OT Goal Formulation: All assessment and education complete, DC therapy  OT Frequency:     Barriers to D/C:            Co-evaluation              AM-PAC PT "6 Clicks" Daily Activity     Outcome Measure Help from another person eating meals?: None Help from another person taking care of personal grooming?: None Help from another person toileting, which includes using toliet, bedpan, or urinal?: A Little Help from another person bathing (including washing, rinsing, drying)?: A Little Help from another person to put on and taking off regular upper body clothing?: A Little Help from another person  to put on and taking off regular lower body clothing?: A Little 6 Click Score: 20   End of Session Equipment Utilized During Treatment: Other (comment)(L shoulder sling/immobilizer, polar care)  Activity Tolerance: Patient limited by pain Patient left: in bed;with call bell/phone within reach;with bed alarm set;with family/visitor present;Other (comment)(polar care and sling in place)  OT Visit Diagnosis: Other abnormalities of gait and mobility (R26.89);Pain Pain -  Right/Left: Left Pain - part of body: Shoulder                Time: 7505-1833 OT Time Calculation (min): 33 min Charges:  OT General Charges $OT Visit: 1 Visit OT Evaluation $OT Eval Low Complexity: 1 Low OT Treatments $Self Care/Home Management : 8-22 mins $Therapeutic Exercise: 8-22 mins G-Codes: OT G-codes **NOT FOR INPATIENT CLASS** Functional Assessment Tool Used: AM-PAC 6 Clicks Daily Activity;Clinical judgement Functional Limitation: Self care Self Care Current Status (P8251): At least 1 percent but less than 20 percent impaired, limited or restricted Self Care Goal Status (G9842): At least 1 percent but less than 20 percent impaired, limited or restricted Self Care Discharge Status [phone]): At least 1 percent but less than 20 percent impaired, limited or restricted   Jeni Salles, MPH, MS, OTR/L ascom [phone] 12/28/16, 9:50 AM

## 2016-12-28 NOTE — Discharge Instructions (Signed)
Chelsey H. Patel, MD  [facility]  Phone: [phone]  Fax: [phone]   Discharge Instructions after Reverse Shoulder Replacement    1. Activity/Sling: You are to be non-weight bearing on operative extremity. A sling/shoulder immobilizer has been provided for you. Only remove the sling to perform elbow, wrist, and hand RoM exercises and hygiene/dressing. Active reaching and lifting are not permitted. You will be given further instructions on sling use at your first physical therapy visit and postoperative visit with Dr. Patel.   2. Dressings: Dressing may be removed at 1st physical therapy visit (~3-4 days after surgery). Afterwards, you may either leave open to air (if no drainage) or cover with dry, sterile dressing. If you have steri-strips on your wound, please do not remove them. They will fall off on their own. You may shower 5 days after surgery. Please pat incision dry. Do not rub or place any shear forces across incision. If there is drainage or any opening of incision after 5 days, please notify our offices immediately.    3. Driving:  Plan on not driving for six weeks. Please note that you are advised NOT to drive while taking narcotic pain medications as you may be impaired and unsafe to drive.   4. Medications:  - You have been provided a prescription for narcotic pain medicine (usually oxycodone). After surgery, take 1-2 narcotic tablets every 4 hours if needed for severe pain. Please start this as soon as you begin to start having pain (if you received a nerve block, start taking as soon as this wears off).  - A prescription for anti-nausea medication will be provided in case the narcotic medicine causes nausea - take 1 tablet every 6 hours only if nauseated.  - Take enteric coated aspirin 325 mg once daily for 6 weeks to prevent blood clots. Do not take aspirin if you have an aspirin sensitivity/allergy or asthma or are on an anticoagulant (blood thinner) already. If so, then  your home anticoagulant will be resume and managed - do not take aspirin. -Take tylenol 1000mg (2 Extra strength or 3 regular strength tablets) every 8 hours for pain. This will reduce the amount of narcotic medication needed. May stop tylenol when you are having minimal pain. - Take a stool softener (Colace, Dulcolax or Senakot) if you are using narcotic pain medications to help with constipation that is associated with narcotic use. - DO NOT take ANY nonsteroidal anti-inflammatory pain medications: Advil, Motrin, Ibuprofen, Aleve, Naproxen, or Naprosyn.   If you are taking prescription medication for anxiety, depression, insomnia, muscle spasm, chronic pain, or for attention deficit disorder you are advised that you are at a higher risk of adverse effects with use of narcotics post-op, including narcotic addiction/dependence, depressed breathing, death. If you use non-prescribed substances: alcohol, marijuana, cocaine, heroin, methamphetamines, etc., you are at a higher risk of adverse effects with use of narcotics post-op, including narcotic addiction/dependence, depressed breathing, death. You are advised that taking > 50 morphine milligram equivalents (MME) of narcotic pain medication per day results in twice the risk of overdose or death. For your prescription provided: oxycodone 5 mg - taking more than 6 tablets per day after the first few days of surgery.   5. Physical Therapy: 1-2 times per week for ~12 weeks. Therapy typically starts on post operative Day 3 or 4. You have been provided an order for physical therapy. The therapist will provide home exercises. Please contact our offices if this appointment has not been scheduled.      6. Work: May do light duty/desk job in approximately 2 weeks when off of narcotics, pain is well-controlled, and swelling has decreased if able to function with one arm in sling. Full work may take 6 weeks if light motions and function of both arms is required.  Lifting jobs may require 12 weeks.   7. Post-Op Appointments: Your first post-op appointment will be with Dr. Patel in approximately 2 weeks time.    If you find that they have not been scheduled please call the Orthopaedic Appointment front desk at [phone].                               Chelsey H. Patel, MD [facility] Phone: [phone] Fax: [phone]   REVERSE SHOULDER ARTHROPLASTY REHAB GUIDELINES   These guidelines should be tailored to individual patients based on their rehab goals, age, precautions, quality of repair, etc.  Progression should be based on patient progress and approval by the referring physician.  PHASE 1 - Day 1 through Week 2  GENERAL GUIDELINES AND PRECAUTIONS Sling wear 24/7 except during grooming and home exercises (3 to 5 times daily) Avoid shoulder extension such that the arm is posterior the frontal plane.  When patients recline, a pillow should be placed behind the upper arm and sling should be on.  They should be advised to always be able to see the elbow Avoid combined IR/ADD/EXT, such as hand behind back to prevent dislocation Avoid combined IR and ADD such as reaching across the chest to prevent dislocation No AROM No submersion in pool/water for 4 weeks No weight bearing through operative arm (as in transfers, walker use, etc.)  GOALS Maintain integrity of joint replacement; protect soft tissue healing Increase PROM for elevation to 120 and ER to 30 (will remain the goal for first 6 weeks) Optimize distal UE circulation and muscle activity (elbow, wrist and hand) Instruct in use of sling for proper fit, polar care device for ice application after HEP, signs/symptoms of infection  EXERCISES Active elbow, wrist and hand Passive forward elevation in scapular plane to 90-120 max motion; ER in scapular plane to 30 Active scapular retraction with arms resting in neutral position  CRITERIA TO PROGRESS TO  PHASE 2 Low pain (less than 3/10) with shoulder PROM Healing of incision without signs of infection Clearance by MD to advance after 2 week MD check up  PHASE 2 - 2 weeks - 6 weeks  GENERAL GUIDELINES AND PRECAUTIONS Sling may be removed while at home; worn in community without abduction pillow May use arm for light activities of daily living (such as feeding, brushing teeth, dressing.) with elbow near  the side of the body  and arm in front of the body- no active lifting of the arm May submerge in water (tub, pool, Jacuzzi, etc.) after 4 weeks Continue to avoid WBing through the operative arm Continue to avoid combined IR/EXT/ADD (hand behind the back) and IR/ADD  (reaching across chest) for dislocation precautions  GOALS  Achieve passive elevation to 120 and ER to 30  Low (less than 3/10) to no pain  Ability to fire all heads of the deltoid  EXERCISES May discontinue grip, and active elbow and wrist exercises since using the arm in ADL's  with sling removed around the home Continue passive elevation to 120 and ER to 30, both in scapular plane with arm supported on table top Add submaximal isometrics, pain free effort, for all   functional heads of deltoid (anterior, posterior, middle)  Ensure that with posterior deltoid isometric the shoulder does not move into extension and the arm remains anterior the frontal plane At 4 weeks:  begin to place arm in balanced position of 90 deg elevation in supine; when patient able to hold this position with ease, may begin reverse pendulums clockwise and counterclockwise  CRITERIA TO PROGRESS TO PHASE 3 Passive forward elevation in scapular plane to 120; passive ER in scapular plane to 30 Ability to fire isometrically all heads of the deltoid muscle without pain Ability to place and hold the arm in balanced position (90 deg elevation in supine)  PHASE 3 - 6 weeks to 3 months  GENERAL GUIDELINES AND PRECAUTIONS Discontinue use of sling Avoid  forcing end range motion in any direction to prevent dislocation  May advance use of the arm actively in ADL's without being restricted to arm by the side of the body, however, avoid heavy lifting and sports (forever!) May initiate functional IR behind the back gently NO UPPER BODY ERGOMETER   GOALS Optimize PROM for elevation and ER in scapular plane with realistic expectation that max  mobility for elevation is usually around 145-160 passively; ER 40 to 50 passively; functional IR to L1 Recover AROM to approach as close to PROM available as possible; may expect 135-150 deg active elevation; 30 deg active ER; active functional IR to L1 Establish dynamic stability of the shoulder with deltoid and periscapular muscle gradual strengthening  EXERCISES Forward elevation in scapular plane active progression: supine to incline, to vertical; short to long lever arm Balanced position long lever arm AROM Active ER/IR with arm at side Scapular retraction with light band resistance Functional IR with hand slide up back - very gentle and gradual NO UPPER BODY ERGOMETER     CRITERIA TO PROGRESS TO PHASE 4  AROM equals/approaches PROM with good mechanics for elevation   No pain  Higher level demand on shoulder than ADL functions   PHASE 4 12 months and beyond  GENERAL GUIDELINES AND PRECAUTIONS No heavy lifting and no overhead sports No heavy pushing activity Gradually increase strength of deltoid and scapular stabilizers; also the rotator cuff if present with weights not to exceed 5 lbs NO UPPER BODY ERGOMETER   GOALS  Optimize functional use of the operative UE to meet the desired demands  Gradual increase in deltoid, scapular muscle, and rotator cuff strength  Pain free functional activities   EXERCISES Add light hand weights for deltoid up to and not to exceed 3 lbs for anterior and posterior with long arm lift against gravity; elbow bent to 90 deg for abduction in scapular  plane Theraband progression for extension to hip with scapular depression/retraction Theraband progression for serratus anterior punches in supine; avoid wall, incline or prone pressups for serratus anterior End range stretching gently without forceful overpressure in all planes (elevation in scapular plane, ER in scapular plane, functional IR) with stretching done for life as part of a daily routine NO UPPER BODY ERGOMETER     CRITERIA FOR DISCHARGE FROM SKILLED PHYSICAL THERAPY  Pain free AROM for shoulder elevation (expect around 135-150)  Functional strength for all ADL's, work tasks, and hobbies approved by surgeon  Independence with home maintenance program   NOTES: 1. With proper exercise, motion, strength, and function continue to improve even after one year. 2. The complication rate after surgery is 5 - 8%. Complications include infection, fracture, heterotopic bone formation, nerve injury, instability, rotator cuff   tear, and tuberosity nonunion. Please look for clinical signs, unusual symptoms, or lack of progress with therapy and report those to Dr. Patel. Prefer more communication than less.  3. The therapy plan above only serves as a guide. Please be aware of specific individualized patient instructions as written on the prescription or through discussions with the surgeon. 4. Please call Dr. Patel if you have any specific questions or concerns [phone]    

## 2016-12-28 NOTE — Progress Notes (Signed)
Physical Therapy Treatment Patient Details Name: Chelsey [name] MRN: [id number] DOB: [birth year] Today's Date: 12/28/2016    History of Present Illness 66 y.o.female with chronic shoulder pain. She underwent ORIF L proximal humerus on 11/23/16 after a fall. At her first post-operative visit, she was found to have collapse of her humeral head back into varus on radiographs. Pt is now POD#1 for reverse L shoulder arthroplasty with L proximal humerous hardware removal.     PT Comments    Pt is making fair progress towards goals with increased ambulation distance this date, however severely limited by pain. Not appropriate for stair training this date. No dizziness noted this session, however BP taken and assessed. Will continue to progress.   Follow Up Recommendations  Home health PT     Equipment Recommendations  None recommended by PT    Recommendations for Other Services       Precautions / Restrictions Precautions Precautions: Fall;Shoulder Shoulder Interventions: Shoulder sling/immobilizer;At all times;Off for dressing/bathing/exercises Precaution Booklet Issued: Yes (comment) Restrictions Weight Bearing Restrictions: Yes LUE Weight Bearing: Non weight bearing    Mobility  Bed Mobility Overal bed mobility: Modified Independent             General bed mobility comments: able to use bed rail for safe technique. Once seated at EOB, no dizziness noted. Upright posture noted. At end of session, pt refused to sit in recliner. Able to perform sit->supine technique well  Transfers   Equipment used: None Transfers: Sit to/from Stand Sit to Stand: Supervision         General transfer comment: safe technique, with no dizziness noted. BP reassessed at 144/111 and then 106/46 respectively. RN notified.  Ambulation/Gait Ambulation/Gait assistance: Supervision Ambulation Distance (Feet): 200 Feet Assistive device: None Gait Pattern/deviations: Step-through pattern      General Gait Details: ambulated around RN station with safe technique, however reports ambulation severely increases pain. Guarding of L shoulder noted with R arm. Reciprocal gait pattern performed with no LOB.   Stairs            Wheelchair Mobility    Modified Rankin (Stroke Patients Only)       Balance                                            Cognition Arousal/Alertness: Awake/alert Behavior During Therapy: WFL for tasks assessed/performed Overall Cognitive Status: Within Functional Limits for tasks assessed                                        Exercises Other Exercises Other Exercises: ambulated to bathroom with cga. Able to perform self hygiene approprately. Needs cues to wait for therapist prior to standing up.    General Comments        Pertinent Vitals/Pain Pain Assessment: 0-10 Pain Score: 7  Pain Location: L shoulder Pain Descriptors / Indicators: Aching;Grimacing;Guarding Pain Intervention(s): Limited activity within patient's tolerance;Ice applied    Home Living                      Prior Function            PT Goals (current goals can now be found in the care plan section) Acute Rehab PT Goals Patient Stated Goal: have less  pain PT Goal Formulation: With patient Time For Goal Achievement: 01/11/17 Potential to Achieve Goals: Good Progress towards PT goals: Progressing toward goals    Frequency    BID      PT Plan Current plan remains appropriate    Co-evaluation              AM-PAC PT "6 Clicks" Daily Activity  Outcome Measure  Difficulty turning over in bed (including adjusting bedclothes, sheets and blankets)?: None Difficulty moving from lying on back to sitting on the side of the bed? : None Difficulty sitting down on and standing up from a chair with arms (e.g., wheelchair, bedside commode, etc,.)?: None Help needed moving to and from a bed to chair (including a  wheelchair)?: A Little Help needed walking in hospital room?: A Little Help needed climbing 3-5 steps with a railing? : A Little 6 Click Score: 21    End of Session Equipment Utilized During Treatment: Gait belt Activity Tolerance: Patient limited by pain Patient left: in bed;with bed alarm set Nurse Communication: Mobility status PT Visit Diagnosis: Unsteadiness on feet (R26.81);Muscle weakness (generalized) (M62.81);Difficulty in walking, not elsewhere classified (R26.2);Dizziness and giddiness (R42);Pain Pain - Right/Left: Left Pain - part of body: Shoulder     Time: 4481-8563 PT Time Calculation (min) (ACUTE ONLY): 29 min  Charges:  $Gait Training: 8-22 mins $Therapeutic Activity: 8-22 mins                    G Codes:  Functional Assessment Tool Used: AM-PAC 6 Clicks Basic Mobility Functional Limitation: Mobility: Walking and moving around Mobility: Walking and Moving Around Current Status (J4970): At least 20 percent but less than 40 percent impaired, limited or restricted Mobility: Walking and Moving Around Goal Status [id number]): At least 1 percent but less than 20 percent impaired, limited or restricted    Chelsey [name], PT, DPT [phone]    Chelsey [name] 12/28/2016, 4:42 PM

## 2016-12-28 NOTE — Discharge Summary (Signed)
Physician Discharge Summary  Subjective: 2 Days Post-Op Procedure(s) (LRB): REVERSE SHOULDER ARTHROPLASTY (Left) HARDWARE REMOVAL LEFT  PROXIMAL HUMEROUS (Left) Patient reports pain as moderate.   Patient seen in rounds with Dr. Posey Pronto. Patient is well, and has had no acute complaints or problems Patient is ready to go home.  Physician Discharge Summary  Patient ID: Chelsey [name] MRN: [id number] DOB/AGE: 31-Aug-1950 66 y.o.  Admit date: 12/27/2016 Discharge date: 12/29/2016  Admission Diagnoses:  Discharge Diagnoses:  Active Problems:   Status post shoulder replacement   Pressure injury of skin   Discharged Condition: fair  Hospital Course: The patient is postop day 2 from a left hardware removal with reverse shoulder replacement. The patient did well last night. She ambulated 200 feet with physical therapy on day 1. Her pain is becoming more controlled. Her drain was removed yesterday. The patient will work with physical therapy before discharge. Her shoulder is still in the immobilizer.  Treatments: surgery:  1. ORIF L proximal humerus 2. Left biceps tenodesis  SURGEON: Cato Mulligan, MD  ANESTHESIA: Gen + interscalene block  ESTIMATED BLOOD LOSS:500cc  TOTAL IV FLUIDS: see anesthesia record  IMPLANTS: Stryker proximal humerus locking plate with associated cortical, locking, and cancellous screws    Discharge Exam: Blood pressure (!) 118/58, pulse 92, temperature 99 F (37.2 C), temperature source Oral, resp. rate 16, height 5\' 5"  (1.651 m), weight 79.4 kg (175 lb), SpO2 98 %.   Disposition: 01-Home or Self Care   Allergies as of 12/29/2016   No Known Allergies     Medication List    TAKE these medications   acetaminophen 500 MG tablet Commonly known as:  TYLENOL Take 500 mg every 6 (six) hours as needed by mouth for moderate pain or headache.   alendronate 70 MG tablet Commonly known as:  FOSAMAX Take 1 tablet (70 mg total) by mouth once a  week. What changed:  when to take this   aspirin 325 MG EC tablet Take 1 tablet (325 mg total) by mouth daily.   busPIRone 10 MG tablet Commonly known as:  BUSPAR Take 1 tablet (10 mg total) 2 (two) times daily by mouth.   CALCIUM + D PO Take 1 tablet daily by mouth.   diphenhydramine-acetaminophen 25-500 MG Tabs tablet Commonly known as:  TYLENOL PM Take 1 tablet at bedtime as needed by mouth (for sleep).   hydrOXYzine 25 MG tablet Commonly known as:  ATARAX/VISTARIL Take 1 tablet (25 mg total) daily as needed by mouth for anxiety.   mirtazapine 30 MG tablet Commonly known as:  REMERON Take 1 tablet (30 mg total) at bedtime by mouth.   multivitamin tablet Take 1 tablet daily by mouth.   omeprazole 20 MG capsule Commonly known as:  PRILOSEC Take 1 capsule (20 mg total) by mouth 2 (two) times daily before a meal.   ondansetron 4 MG tablet Commonly known as:  ZOFRAN Take 1 tablet (4 mg total) by mouth every 6 (six) hours as needed for nausea.   oxyCODONE 5 MG immediate release tablet Commonly known as:  Oxy IR/ROXICODONE Take 1-2 tablets (5-10 mg total) by mouth every 4 (four) hours as needed for moderate pain.   simvastatin 20 MG tablet Commonly known as:  ZOCOR Take 1 tablet (20 mg total) by mouth daily. bedtime What changed:    when to take this  additional instructions   Vitamin D 2000 units tablet Take 2,000 Units by mouth daily.      Follow-up  Information    Leim Fabry, MD Follow up on 01/12/2017.   Specialty:  Orthopedic Surgery Why:  For staple removal at  [place] information: [address] [place] [id number] [phone]           Signed: Prescott Parma, Vanassa Penniman 12/29/2016, 7:17 AM   Objective: Vital signs in last 24 hours: Temp:  [98.2 F (36.8 C)-99 F (37.2 C)] 99 F (37.2 C) (12/05 0551) Pulse Rate:  [86-93] 92 (12/05 0551) Resp:  [16-20] 16 (12/04 2008) BP: (89-118)/(48-62) 118/58 (12/05 0551) SpO2:  [97  %-98 %] 98 % (12/05 0551)  Intake/Output from previous day:  Intake/Output Summary (Last 24 hours) at 12/29/2016 0717 Last data filed at 12/28/2016 1800 Gross per 24 hour  Intake 600 ml  Output -  Net 600 ml    Intake/Output this shift: No intake/output data recorded.  Labs: Recent Labs    12/28/16 0410  HGB 9.8*   Recent Labs    12/28/16 0410  WBC 8.5  RBC 3.28*  HCT 29.1*  PLT 273   Recent Labs    12/28/16 0410  NA 135  K 3.6  CL 105  CO2 24  BUN 11  CREATININE 0.58  GLUCOSE 108*  CALCIUM 8.3*   No results for input(s): LABPT, INR in the last 72 hours.  EXAM: General - Patient is Alert and Oriented Extremity - Sensation intact distally Compartment soft Incision - clean, dry, no drainage, with the honeycomb dressing intact. Motor Function -  moving fingers and wrist well. Axillary nerve intact with motor function and sensation.  Assessment/Plan: 2 Days Post-Op Procedure(s) (LRB): REVERSE SHOULDER ARTHROPLASTY (Left) HARDWARE REMOVAL LEFT  PROXIMAL HUMEROUS (Left) Procedure(s) (LRB): REVERSE SHOULDER ARTHROPLASTY (Left) HARDWARE REMOVAL LEFT  PROXIMAL HUMEROUS (Left) Past Medical History:  Diagnosis Date  . Breast cancer ([place]) 1997   right breast, radiation  . Bronchitis    recent/ 06/09/15 had chest xray/ [facility]/resolved  . Cancer [facility]) 1997   right lumpectomy,L/Ad/R   . GERD (gastroesophageal reflux disease)   . Hypercholesteremia   . Hyperlipidemia   . Panic attack   . Personal history of malignant neoplasm of breast    Active Problems:   Status post shoulder replacement   Pressure injury of skin  Estimated body mass index is 29.12 kg/m as calculated from the following:   Height as of this encounter: 5\' 5"  (1.651 m).   Weight as of this encounter: 79.4 kg (175 lb). Advance diet Up with therapy D/C IV fluids  Plan to discharge home today. Physical therapy first. Diet - Regular diet Follow up - in 2 weeks Activity - WBAT on  lower extremities and nonweightbearing on the left arm with her shoulder immobilizer. Disposition - Home Condition Upon Discharge - Stable DVT Prophylaxis - Aspirin  Reche Dixon, PA-C Orthopaedic Surgery 12/29/2016, 7:17 AM

## 2016-12-28 NOTE — Care Management (Signed)
Spoke with Dr. Posey Pronto. Discussed home health vs. Outpatient PT. He states he has spoken with the family and they are able to take patient to outpatient PT. He is good with patient going home with outpatient PT with family support.

## 2016-12-28 NOTE — Care Management (Signed)
Met with patient who states she has an outpatient therapy appointment this Friday. She will discharge on ASA. Patient denies any discharge concerns. No issues with transportation, paying for medications or assistance at home.

## 2016-12-28 NOTE — Progress Notes (Addendum)
  Subjective: 1 Day Post-Op Procedure(s) (LRB): REVERSE SHOULDER ARTHROPLASTY (Left) HARDWARE REMOVAL LEFT  PROXIMAL HUMEROUS (Left) Patient reports pain as moderate.   Patient seen in rounds with Dr. Posey Pronto. Patient is well, and has had no acute complaints or problems Plan is to go Home after hospital stay. Negative for chest pain and shortness of breath Fever: no Gastrointestinal: Negative for nausea and vomiting  Objective: Vital signs in last 24 hours: Temp:  [98 F (36.7 C)-98.9 F (37.2 C)] 98.6 F (37 C) (12/04 0503) Pulse Rate:  [67-106] 91 (12/04 0503) Resp:  [7-20] 16 (12/04 0503) BP: (92-130)/(46-79) 116/63 (12/04 0503) SpO2:  [92 %-100 %] 100 % (12/04 0503)  Intake/Output from previous day:  Intake/Output Summary (Last 24 hours) at 12/28/2016 0624 Last data filed at 12/28/2016 0007 Gross per 24 hour  Intake 2550 ml  Output 1800 ml  Net 750 ml    Intake/Output this shift: Total I/O In: -  Out: 1330 [Urine:1300; Drains:30]  Labs: Recent Labs    12/28/16 0410  HGB 9.8*   Recent Labs    12/28/16 0410  WBC 8.5  RBC 3.28*  HCT 29.1*  PLT 273   Recent Labs    12/28/16 0410  NA 135  K 3.6  CL 105  CO2 24  BUN 11  CREATININE 0.58  GLUCOSE 108*  CALCIUM 8.3*   No results for input(s): LABPT, INR in the last 72 hours.   EXAM General - Patient is Alert and Oriented Extremity - Neurovascular intact Compartment soft Dressing/Incision - clean, dry, no drainage, with the Hemovac removed Motor Function - intact, moving fingers and hand well on exam.   Past Medical History:  Diagnosis Date  . Breast cancer ([place]) 1997   right breast, radiation  . Bronchitis    recent/ 06/09/15 [facility] chest x[facility]/resolved  . Cancer [facility]) 1997   right lumpectomy,L/Ad/R   . GERD (gastroesophageal reflux disease)   . Hypercholesteremia   . Hyperlipidemia   . Panic attack   . Personal history of malignant neoplasm of breast      Assessment/Plan: 1 Day Post-Op Procedure(s) (LRB): REVERSE SHOULDER ARTHROPLASTY (Left) HARDWARE REMOVAL LEFT  PROXIMAL HUMEROUS (Left) Active Problems:   Status post shoulder replacement   Pressure injury of skin  Estimated body mass index is 29.12 kg/m as calculated from the following:   Height as of this encounter: 5\' 5"  (1.651 m).   Weight as of this encounter: 79.4 kg (175 lb). Advance diet Up with therapy D/C IV fluids  Plan to discharge home today  DVT Prophylaxis - Aspirin Shoulder immobilizer in place.  Reche Dixon, PA-C Orthopaedic Surgery 12/28/2016, 6:24 AM   ADDENDUM: Patient evaluated ~230pm today. Having some pain control issues, but responsive to oxycodone. Neurovascularly intact distally on operative extremity. Incision appears to be healing well under honeycomb dressing.   Plan: - Keep one additional night and plan to discharge tomorrow due to pain control - Keep sling and abduction pillow in place such that the arm is in neutral rotation. NWB LUE.  - PT/OT while inpatient. Will get out outpatient PT on discharge.  - ASA for DVT ppx - Oxycodone + scheduled tylenol for pain - Bowel regimen

## 2016-12-28 NOTE — Evaluation (Signed)
Physical Therapy Evaluation Patient Details Name: Chelsey [name] MRN: [id number] DOB: April 11, 1950 Today's Date: 12/28/2016   History of Present Illness  66 y.o.female with chronic shoulder pain. She underwent ORIF L proximal humerus on 11/23/16 after a fall. At her first post-operative visit, she was found to have collapse of her humeral head back into varus on radiographs. Pt is now POD#1 for reverse L shoulder arthroplasty with L proximal humerous hardware removal.   Clinical Impression  Pt is a pleasant 66 year old female who was admitted for reverse L shoulder arthroplasty with hardware removal. Pt performs bed mobility with mod I, transfers with supervision, and ambulation with supervision and no AD. Ambulation limited this date secondary to orthostatic hypotension with BP decreasing to 88/50 with standing attempts. RN aware.  Pt demonstrates deficits with pain/mobility. Would benefit from skilled PT to address above deficits and promote optimal return to PLOF. Recommend transition to [place] upon discharge from acute hospitalization.       Follow Up Recommendations Home health PT    Equipment Recommendations  None recommended by PT    Recommendations for Other Services       Precautions / Restrictions Precautions Precautions: Fall;Shoulder Shoulder Interventions: Shoulder sling/immobilizer;At all times;Off for dressing/bathing/exercises Precaution Booklet Issued: Yes (comment) Restrictions Weight Bearing Restrictions: Yes LUE Weight Bearing: Non weight bearing      Mobility  Bed Mobility Overal bed mobility: Modified Independent             General bed mobility comments: able to use bed rail for safe technique. Once seated at EOB, no dizziness noted. Upright posture noted  Transfers Overall transfer level: Needs assistance Equipment used: None Transfers: Sit to/from Stand Sit to Stand: Supervision         General transfer comment: Safe technique, however once  standing, pt felt dizzy. Encouraged to sit back down on bed. Notified RN, 2nd attempt, stood for orthostatics with low BP.   Ambulation/Gait Ambulation/Gait assistance: Supervision Ambulation Distance (Feet): 3 Feet Assistive device: None Gait Pattern/deviations: Step-to pattern     General Gait Details: able to side step back and forth at bed side prior to getting back in bed. Only limited secondary to dizziness. Safe technique noted, although unable to further progress.  Stairs            Wheelchair Mobility    Modified Rankin (Stroke Patients Only)       Balance Overall balance assessment: Needs assistance Sitting-balance support: Single extremity supported;Feet supported Sitting balance-Leahy Scale: Good     Standing balance support: No upper extremity supported Standing balance-Leahy Scale: Fair Standing balance comment: fair-                             Pertinent Vitals/Pain Pain Assessment: 0-10 Pain Score: 9  Pain Location: L shoulder Pain Descriptors / Indicators: Aching;Grimacing;Guarding Pain Intervention(s): Limited activity within patient's tolerance;Ice applied    Home Living Family/patient expects to be discharged to:: Private residence Living Arrangements: Children Available Help at Discharge: Family;Available PRN/intermittently Type of Home: House Home Access: Stairs to enter Entrance Stairs-Rails: None Entrance Stairs-Number of Steps: 2 Home Layout: One level Home Equipment: Bedside commode;Shower seat      Prior Function Level of Independence: Independent         Comments: Prior to shoulder surgery in October, 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      Hand Dominance   Dominant Hand: Left  Extremity/Trunk Assessment   Upper Extremity Assessment Upper Extremity Assessment: Defer to OT evaluation LUE Deficits / Details: hand/wrist ROM WFL, grip WFL, full PROM of elbow LUE: Unable to fully assess due  to immobilization;Unable to fully assess due to pain    Lower Extremity Assessment Lower Extremity Assessment: Overall WFL for tasks assessed    Cervical / Trunk Assessment Cervical / Trunk Assessment: Normal  Communication   Communication: No difficulties  Cognition Arousal/Alertness: Awake/alert Behavior During Therapy: WFL for tasks assessed/performed Overall Cognitive Status: Within Functional Limits for tasks assessed                                        General Comments      Exercises Other Exercises Other Exercises: educated on orthostatic hypotension during standing attempts. Assisted pt back into bed. Also discussed home safety as pt has anxiety of leaving hospital this date. Other Exercises: pt/sister educated in polar care mgt, compression stocking mgt, and sling mgt Donning/doffing shirt without moving shoulder: Caregiver independent with task Method for sponge bathing under operated UE: Independent Donning/doffing sling/immobilizer: Caregiver independent with task Correct positioning of sling/immobilizer: Caregiver independent with task ROM for elbow, wrist and digits of operated UE: Modified independent;Caregiver independent with task Sling wearing schedule (on at all times/off for ADL's): Independent Proper positioning of operated UE when showering: Independent Positioning of UE while sleeping: Independent   Assessment/Plan    PT Assessment Patient needs continued PT services  PT Problem List Decreased strength;Decreased activity tolerance;Decreased balance;Decreased mobility;Pain       PT Treatment Interventions Gait training;DME instruction;Stair training;Therapeutic exercise;Balance training    PT Goals (Current goals can be found in the Care Plan section)  Acute Rehab PT Goals Patient Stated Goal: have less pain PT Goal Formulation: With patient Time For Goal Achievement: 01/11/17 Potential to Achieve Goals: Good    Frequency  BID   Barriers to discharge        Co-evaluation               AM-PAC PT "6 Clicks" Daily Activity  Outcome Measure Difficulty turning over in bed (including adjusting bedclothes, sheets and blankets)?: None Difficulty moving from lying on back to sitting on the side of the bed? : None Difficulty sitting down on and standing up from a chair with arms (e.g., wheelchair, bedside commode, etc,.)?: None Help needed moving to and from a bed to chair (including a wheelchair)?: A Little Help needed walking in hospital room?: A Little Help needed climbing 3-5 steps with a railing? : A Little 6 Click Score: 21    End of Session Equipment Utilized During Treatment: Gait belt Activity Tolerance: Treatment limited secondary to medical complications (Comment) Patient left: in bed;with bed alarm set Nurse Communication: Mobility status PT Visit Diagnosis: Unsteadiness on feet (R26.81);Muscle weakness (generalized) (M62.81);Difficulty in walking, not elsewhere classified (R26.2);Dizziness and giddiness (R42);Pain Pain - Right/Left: Left Pain - part of body: Shoulder    Time: 0932-6712 PT Time Calculation (min) (ACUTE ONLY): 20 min   Charges:   PT Evaluation $PT Eval Low Complexity: 1 Low PT Treatments $Therapeutic Activity: 8-22 mins   PT G Codes:   PT G-Codes **NOT FOR INPATIENT CLASS** Functional Assessment Tool Used: AM-PAC 6 Clicks Basic Mobility Functional Limitation: Mobility: Walking and moving around Mobility: Walking and Moving Around Current Status (W5809): At least 20 percent but less than 40 percent impaired, limited  or restricted Mobility: Walking and Moving Around Goal Status [phone]): At least 1 percent but less than 20 percent impaired, limited or restricted    Greggory Stallion, PT, DPT [phone]   Chelsey [name] 12/28/2016, 10:22 AM

## 2016-12-29 ENCOUNTER — Encounter: Payer: Self-pay | Admitting: Orthopedic Surgery

## 2016-12-29 LAB — SURGICAL PATHOLOGY

## 2016-12-29 NOTE — Progress Notes (Signed)
Discharge note;  Discharge instructions and prescriptions given to pt. IV removed. No questions from patient. Will assist patient to get dressed and will be ready to discharge home with ride.

## 2016-12-29 NOTE — Progress Notes (Signed)
  Subjective: 2 Days Post-Op Procedure(s) (LRB): REVERSE SHOULDER ARTHROPLASTY (Left) HARDWARE REMOVAL LEFT  PROXIMAL HUMEROUS (Left) Patient reports pain as moderate.   Patient seen in rounds with Dr. Posey Pronto. Patient is well, and has had no acute complaints or problems Plan is to go Home after hospital stay. Negative for chest pain and shortness of breath Fever: no Gastrointestinal: Negative for nausea and vomiting  Objective: Vital signs in last 24 hours: Temp:  [98.2 F (36.8 C)-99 F (37.2 C)] 99 F (37.2 C) (12/05 0551) Pulse Rate:  [86-93] 92 (12/05 0551) Resp:  [16-20] 16 (12/04 2008) BP: (89-118)/(48-62) 118/58 (12/05 0551) SpO2:  [97 %-98 %] 98 % (12/05 0551)  Intake/Output from previous day:  Intake/Output Summary (Last 24 hours) at 12/29/2016 0713 Last data filed at 12/28/2016 1800 Gross per 24 hour  Intake 600 ml  Output -  Net 600 ml    Intake/Output this shift: No intake/output data recorded.  Labs: Recent Labs    12/28/16 0410  HGB 9.8*   Recent Labs    12/28/16 0410  WBC 8.5  RBC 3.28*  HCT 29.1*  PLT 273   Recent Labs    12/28/16 0410  NA 135  K 3.6  CL 105  CO2 24  BUN 11  CREATININE 0.58  GLUCOSE 108*  CALCIUM 8.3*   No results for input(s): LABPT, INR in the last 72 hours.   EXAM General - Patient is Alert and Oriented Extremity - Neurovascular intact Compartment soft Dressing/Incision - clean, dry, no drainage with the honeycomb dressing showing minimal discharge. Motor Function - intact, moving fingers and hand well on exam. The patient ambulated 200 feet with physical therapy.  Past Medical History:  Diagnosis Date  . Breast cancer ([place]) 1997   right breast, radiation  . Bronchitis    recent/ 06/09/15 had chest x[facility]/ [facility]/resolved  . Cancer [facility]) 1997   right lumpectomy,L/Ad/R   . GERD (gastroesophageal reflux disease)   . Hypercholesteremia   . Hyperlipidemia   . Panic attack   . Personal history of  malignant neoplasm of breast     Assessment/Plan: 2 Days Post-Op Procedure(s) (LRB): REVERSE SHOULDER ARTHROPLASTY (Left) HARDWARE REMOVAL LEFT  PROXIMAL HUMEROUS (Left) Active Problems:   Status post shoulder replacement   Pressure injury of skin  Estimated body mass index is 29.12 kg/m as calculated from the following:   Height as of this encounter: 5\' 5"  (1.651 m).   Weight as of this encounter: 79.4 kg (175 lb).  Physical therapy today. Plan to discharge home today  DVT Prophylaxis - Aspirin Shoulder immobilizer in place.  Reche Dixon, PA-C Orthopaedic Surgery 12/29/2016, 7:13 AM

## 2016-12-29 NOTE — Progress Notes (Signed)
Physical Therapy Treatment Patient Details Name: Chelsey [name] MRN: [id number] DOB: 24-May-1950 Today's Date: 12/29/2016    History of Present Illness 66 y.o.female with chronic shoulder pain. She underwent ORIF L proximal humerus on 11/23/16 after a fall. At her first post-operative visit, she was found to have collapse of her humeral head back into varus on radiographs. Pt is now POD#1 for reverse L shoulder arthroplasty with L proximal humerous hardware removal.     PT Comments    Pt is making good progress towards goals and is safe to dc this date. Good endurance with stair training and ambulation around RN station. Reviewed HEP with safe technique. All questions addressed. Assisted with sling management. Will sign off, RN aware.   Follow Up Recommendations  Outpatient PT     Equipment Recommendations  None recommended by PT    Recommendations for Other Services       Precautions / Restrictions Precautions Precautions: Fall;Shoulder Shoulder Interventions: Shoulder sling/immobilizer;At all times;Off for dressing/bathing/exercises Precaution Booklet Issued: Yes (comment) Restrictions Weight Bearing Restrictions: Yes LUE Weight Bearing: Non weight bearing    Mobility  Bed Mobility Overal bed mobility: Modified Independent             General bed mobility comments: Ease of transition noted this date compared to previous session. Upright posture noted.  Transfers Overall transfer level: Independent Equipment used: None Transfers: Sit to/from Stand Sit to Stand: Independent         General transfer comment: safe technique performed. Ease of transition noted  Ambulation/Gait Ambulation/Gait assistance: Supervision Ambulation Distance (Feet): 250 Feet Assistive device: None Gait Pattern/deviations: Step-through pattern     General Gait Details: ambulated with reciprocal gait pattern and improved technique this date. No dizziness noted and pt steady on  feet. Increased speed performed   Stairs Stairs: Yes   Stair Management: One rail Right;Alternating pattern Number of Stairs: 4 General stair comments: Pt performed stair training twice, first with rail and then without rail to simulate home environment. Safe technique performed with alt. pattern. Cues to slow down and be intentional and not rush.  Wheelchair Mobility    Modified Rankin (Stroke Patients Only)       Balance                                            Cognition Arousal/Alertness: Awake/alert Behavior During Therapy: WFL for tasks assessed/performed Overall Cognitive Status: Within Functional Limits for tasks assessed                                        Exercises Other Exercises Other Exercises: performed ther-ex on L hand, wrist, and elbow including grip squeezes, wrist circles, wrist flexion/extension, and PROM of elbow flexion/extension with pt's right hand assisting. All ther-ex performed 10 reps with cues and sequencing.    General Comments        Pertinent Vitals/Pain Pain Assessment: 0-10 Pain Score: 5  Pain Location: L shoulder Pain Descriptors / Indicators: Aching;Grimacing;Guarding Pain Intervention(s): Limited activity within patient's tolerance    Home Living                      Prior Function            PT Goals (current goals can  now be found in the care plan section) Acute Rehab PT Goals Patient Stated Goal: have less pain PT Goal Formulation: With patient Time For Goal Achievement: 01/11/17 Potential to Achieve Goals: Good Progress towards PT goals: Progressing toward goals    Frequency    BID      PT Plan Current plan remains appropriate    Co-evaluation              AM-PAC PT "6 Clicks" Daily Activity  Outcome Measure  Difficulty turning over in bed (including adjusting bedclothes, sheets and blankets)?: None Difficulty moving from lying on back to sitting on  the side of the bed? : None Difficulty sitting down on and standing up from a chair with arms (e.g., wheelchair, bedside commode, etc,.)?: None Help needed moving to and from a bed to chair (including a wheelchair)?: None Help needed walking in hospital room?: None Help needed climbing 3-5 steps with a railing? : A Little 6 Click Score: 23    End of Session Equipment Utilized During Treatment: Gait belt Activity Tolerance: Patient limited by pain Patient left: in bed;with bed alarm set Nurse Communication: Mobility status PT Visit Diagnosis: Unsteadiness on feet (R26.81);Muscle weakness (generalized) (M62.81);Difficulty in walking, not elsewhere classified (R26.2);Dizziness and giddiness (R42);Pain Pain - Right/Left: Left Pain - part of body: Shoulder     Time: 0263-7858 PT Time Calculation (min) (ACUTE ONLY): 23 min  Charges:  $Gait Training: 8-22 mins $Therapeutic Exercise: 8-22 mins                    G Codes:  Functional Assessment Tool Used: AM-PAC 6 Clicks Basic Mobility Functional Limitation: Mobility: Walking and moving around Mobility: Walking and Moving Around Current Status (I5027): At least 1 percent but less than 20 percent impaired, limited or restricted Mobility: Walking and Moving Around Goal Status [phone]): 0 percent impaired, limited or restricted    Greggory Stallion, PT, DPT [phone]    Alek Borges 12/29/2016, 11:26 AM

## 2017-01-01 LAB — AEROBIC/ANAEROBIC CULTURE W GRAM STAIN (SURGICAL/DEEP WOUND): Culture: NO GROWTH

## 2017-01-01 LAB — AEROBIC/ANAEROBIC CULTURE (SURGICAL/DEEP WOUND)

## 2017-01-04 ENCOUNTER — Ambulatory Visit: Payer: Federal, State, Local not specified - PPO | Admitting: Unknown Physician Specialty

## 2017-01-12 ENCOUNTER — Encounter: Payer: Self-pay | Admitting: Unknown Physician Specialty

## 2017-01-12 ENCOUNTER — Ambulatory Visit (INDEPENDENT_AMBULATORY_CARE_PROVIDER_SITE_OTHER): Payer: Federal, State, Local not specified - PPO | Admitting: Unknown Physician Specialty

## 2017-01-12 VITALS — BP 96/68 | HR 85 | Temp 97.9°F | Wt 172.2 lb

## 2017-01-12 DIAGNOSIS — F5101 Primary insomnia: Secondary | ICD-10-CM

## 2017-01-12 DIAGNOSIS — Z9889 Other specified postprocedural states: Secondary | ICD-10-CM

## 2017-01-12 DIAGNOSIS — D508 Other iron deficiency anemias: Secondary | ICD-10-CM

## 2017-01-12 DIAGNOSIS — Z8781 Personal history of (healed) traumatic fracture: Secondary | ICD-10-CM

## 2017-01-12 DIAGNOSIS — R21 Rash and other nonspecific skin eruption: Secondary | ICD-10-CM | POA: Diagnosis not present

## 2017-01-12 DIAGNOSIS — D649 Anemia, unspecified: Secondary | ICD-10-CM | POA: Insufficient documentation

## 2017-01-12 DIAGNOSIS — Z967 Presence of other bone and tendon implants: Secondary | ICD-10-CM | POA: Diagnosis not present

## 2017-01-12 DIAGNOSIS — G4700 Insomnia, unspecified: Secondary | ICD-10-CM | POA: Insufficient documentation

## 2017-01-12 DIAGNOSIS — Z8639 Personal history of other endocrine, nutritional and metabolic disease: Secondary | ICD-10-CM | POA: Diagnosis not present

## 2017-01-12 MED ORDER — SIMVASTATIN 20 MG PO TABS: 20.0000 mg | ORAL_TABLET | Freq: Every day | ORAL | 3 refills | Status: DC

## 2017-01-12 MED ORDER — CLOTRIMAZOLE-BETAMETHASONE 1-0.05 % EX CREA: 1.0000 "application " | TOPICAL_CREAM | Freq: Two times a day (BID) | CUTANEOUS | 0 refills | Status: DC

## 2017-01-12 MED ORDER — SUVOREXANT 10 MG PO TABS: 10.0000 mg | ORAL_TABLET | Freq: Every day | ORAL | 0 refills | Status: DC

## 2017-01-12 NOTE — Progress Notes (Signed)
BP 96/68   Pulse 85   Temp 97.9 F (36.6 C) (Oral)   Wt 172 lb 3.2 oz (78.1 kg)   LMP  (LMP Unknown)   SpO2 98%   BMI 28.66 kg/m    Subjective:    Patient ID: Chelsey [name], female    DOB: July 09, 1950, 66 y.o.   MRN: [id number]  HPI: Chelsey [name] is a 66 y.o. female  Chief Complaint  Patient presents with  . Hypertension  . 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  Had a shoulder replacement.  Noted anemia last blood draw   Insomnia Getting just a few hours of sleep/night.  Not sleeping during the day.  Not taking Oxycodone.    Rash Crack of buttock for about 1-2 weeks.  Pamelia Hoit it is painful.    Reflux Taking Omeprazole.  Doing well  Relevant past medical, surgical, family and social history reviewed and updated as indicated. Interim medical history since our last visit reviewed. Allergies and medications reviewed and updated.  Review of Systems  Per HPI unless specifically indicated above     Objective:    BP 96/68   Pulse 85   Temp 97.9 F (36.6 C) (Oral)   Wt 172 lb 3.2 oz (78.1 kg)   LMP  (LMP Unknown)   SpO2 98%   BMI 28.66 kg/m   Wt Readings from Last 3 Encounters:  01/12/17 172 lb 3.2 oz (78.1 kg)  12/27/16 175 lb (79.4 kg)  12/20/16 179 lb 4.8 oz (81.3 kg)    Physical Exam  Constitutional: She is oriented to person, place, and time. She appears well-developed and well-nourished. No distress.  HENT:  Head: Normocephalic and atraumatic.  Eyes: Conjunctivae and lids are normal. Right eye exhibits no discharge. Left eye exhibits no discharge. No scleral icterus.  Neck: Normal range of motion. Neck supple. No JVD present. Carotid bruit is not present.  Cardiovascular: Normal rate, regular  rhythm and normal heart sounds.  Pulmonary/Chest: Effort normal and breath sounds normal.  Abdominal: Normal appearance. There is no splenomegaly or hepatomegaly.  Musculoskeletal:  Left shoulder in sling  Neurological: She is alert and oriented to person, place, and time.  Skin: Skin is warm, dry and intact. No pallor.  Upper buttocks with dry scaley rash  Psychiatric: She has a normal mood and affect. Her behavior is normal. Judgment and thought content normal.       Assessment & Plan:   Problem List Items Addressed This Visit      Unprioritized   Anemia    Due to shoulder surgery.  Recheck today      Relevant Orders   CBC With Differential/Platelet   H/O hypercholesterolemia    Stable, continue present medications.        Insomnia    New problem.  Rx and card given for Belsomra.  Discussed that any further prescriptions for sleep should come from psychiatry      S/P ORIF (open reduction internal fixation) fracture    Doing well and not taking pain medications at this time       Other Visit Diagnoses    Rash    -  Primary   Upper buttocks.  Suspect yeast.  Rx for Lotrisone       Follow  up plan: Return in about 6 months (around 07/13/2017).

## 2017-01-12 NOTE — Assessment & Plan Note (Signed)
Due to shoulder surgery.  Recheck today

## 2017-01-12 NOTE — Assessment & Plan Note (Signed)
Doing well and not taking pain medications at this time

## 2017-01-12 NOTE — Assessment & Plan Note (Addendum)
New problem.  Rx and card given for Belsomra.  Discussed that any further prescriptions for sleep should come from psychiatry

## 2017-01-12 NOTE — Assessment & Plan Note (Signed)
Stable, continue present medications.   

## 2017-01-13 ENCOUNTER — Telehealth: Payer: Self-pay | Admitting: Unknown Physician Specialty

## 2017-01-13 LAB — CBC WITH DIFFERENTIAL/PLATELET
HEMOGLOBIN: 10.1 g/dL — AB (ref 11.1–15.9)
Hematocrit: 31.3 % — ABNORMAL LOW (ref 34.0–46.6)
LYMPHS ABS: 1.7 10*3/uL (ref 0.7–3.1)
Lymphs: 22 %
MCH: 29.2 pg (ref 26.6–33.0)
MCHC: 32.3 g/dL (ref 31.5–35.7)
MCV: 91 fL (ref 79–97)
MID (ABSOLUTE): 1 10*3/uL (ref 0.1–1.6)
MID: 13 %
NEUTROS ABS: 4.9 10*3/uL (ref 1.4–7.0)
NEUTROS PCT: 65 %
Platelets: 563 10*3/uL — ABNORMAL HIGH (ref 150–379)
RBC: 3.46 x10E6/uL — ABNORMAL LOW (ref 3.77–5.28)
RDW: 13.2 % (ref 12.3–15.4)
WBC: 7.6 10*3/uL (ref 3.4–10.8)

## 2017-01-13 NOTE — Telephone Encounter (Signed)
Chelsey [name] called in advising that the pt only received 20 tablets of the Belsomra RX.   The pharmacy had a broken box so was not able to fill the Rx of the 30 tablets that was ordered by Dr. Julian Hy.   The pt was ok with only getting 20 tablets since she did not have a co-pay. The pharmacy just wanted a note put in her chart that she is not requesting early when it comes time to refill the Rx since she only got 20 tabs instead of 30.  Did not want it to appear she was requesting early. I routed a note to Dr. Otelia Limes nurse pool to let them know.

## 2017-01-13 NOTE — Telephone Encounter (Signed)
Routing to provider, FYI.  

## 2017-01-14 ENCOUNTER — Encounter: Payer: Self-pay | Admitting: Unknown Physician Specialty

## 2017-01-28 ENCOUNTER — Telehealth: Payer: Self-pay | Admitting: Unknown Physician Specialty

## 2017-01-28 MED ORDER — ZOLPIDEM TARTRATE 10 MG PO TABS: 10.0000 mg | ORAL_TABLET | Freq: Every evening | ORAL | 1 refills | Status: DC | PRN

## 2017-01-28 NOTE — Telephone Encounter (Signed)
Copied from [place] [phone]. Topic: Quick Communication - See Telephone Encounter >> Jan 28, 2017 11:38 AM Aurelio Brash B wrote: CRM for notification. See Telephone encounter for:  Pt was given Suvorexant (BELSOMRA) 10 MG TABS  to help her sleep, she states it is not helping,  she made an apt fore Monday and wants to know if there is anything else she can get prescribed   medicap pharm

## 2017-01-28 NOTE — Telephone Encounter (Signed)
Routing to provider  

## 2017-01-28 NOTE — Telephone Encounter (Signed)
Called and let patient know that the RX was faxed to the pharmacy for her.

## 2017-01-31 ENCOUNTER — Encounter: Payer: Self-pay | Admitting: Unknown Physician Specialty

## 2017-01-31 ENCOUNTER — Ambulatory Visit: Payer: Federal, State, Local not specified - PPO | Admitting: Unknown Physician Specialty

## 2017-01-31 VITALS — BP 101/68 | HR 92 | Temp 98.9°F | Wt 171.3 lb

## 2017-01-31 DIAGNOSIS — R05 Cough: Secondary | ICD-10-CM

## 2017-01-31 DIAGNOSIS — F5101 Primary insomnia: Secondary | ICD-10-CM | POA: Diagnosis not present

## 2017-01-31 DIAGNOSIS — R059 Cough, unspecified: Secondary | ICD-10-CM

## 2017-01-31 MED ORDER — AZITHROMYCIN 250 MG PO TABS: ORAL_TABLET | ORAL | 0 refills | Status: DC

## 2017-01-31 MED ORDER — MIRTAZAPINE 15 MG PO TBDP: 15.0000 mg | ORAL_TABLET | Freq: Every day | ORAL | 1 refills | Status: DC

## 2017-01-31 NOTE — Patient Instructions (Addendum)
Cognitive behavioral therapy:  [email or web address]  Stop Tylenol PM and Hydroxyzine

## 2017-01-31 NOTE — Progress Notes (Signed)
BP 101/68   Pulse 92   Temp 98.9 F (37.2 C) (Oral)   Wt 171 lb 4.8 oz (77.7 kg)   LMP  (LMP Unknown)   SpO2 98%   BMI 28.51 kg/m    Subjective:    Patient ID: Chelsey [name], female    DOB: 1950-05-06, 67 y.o.   MRN: [id number]  HPI: Chelsey [name] is a 67 y.o. female  Chief Complaint  Patient presents with  . Insomnia    pt states she is still having trouble sleeping, states belsomra and ambien do not help  . Cough    pt states she has had a cough and chest congestion for about a week and a half   Insomnia Pt states Ambien and Belsomra did not help.  Pt states she is not going to bed until 6A and sleeps for 3 hours.  Occasionally doses for a few hours.  Insomnia started after shoulder surgery.  Cough  This is a new problem. Episode onset: 1-2 weeks. The problem has been unchanged. The cough is productive of sputum. Associated symptoms include rhinorrhea. Pertinent negatives include no chest pain, chills, ear congestion, ear pain, fever, headaches, heartburn, hemoptysis, myalgias, nasal congestion, postnasal drip, rash, sore throat, shortness of breath, sweats, weight loss or wheezing. Exacerbated by: lying down. She has tried nothing for the symptoms. smoking    Relevant past medical, surgical, family and social history reviewed and updated as indicated. Interim medical history since our last visit reviewed. Allergies and medications reviewed and updated.  Review of Systems  Constitutional: Negative for chills, fever and weight loss.  HENT: Positive for rhinorrhea. Negative for ear pain, postnasal drip and sore throat.   Respiratory: Positive for cough. Negative for hemoptysis, shortness of breath and wheezing.   Cardiovascular: Negative for chest pain.  Gastrointestinal: Negative for heartburn.  Musculoskeletal: Negative for myalgias.  Skin: Negative for rash.  Neurological: Negative for headaches.    Per HPI unless specifically indicated above     Objective:      BP 101/68   Pulse 92   Temp 98.9 F (37.2 C) (Oral)   Wt 171 lb 4.8 oz (77.7 kg)   LMP  (LMP Unknown)   SpO2 98%   BMI 28.51 kg/m   Wt Readings from Last 3 Encounters:  01/31/17 171 lb 4.8 oz (77.7 kg)  01/12/17 172 lb 3.2 oz (78.1 kg)  12/27/16 175 lb (79.4 kg)    Physical Exam  Constitutional: She is oriented to person, place, and time. She appears well-developed and well-nourished. No distress.  HENT:  Head: Normocephalic and atraumatic.  Eyes: Conjunctivae and lids are normal. Right eye exhibits no discharge. Left eye exhibits no discharge. No scleral icterus.  Neck: Normal range of motion. Neck supple. No JVD present. Carotid bruit is not present.  Cardiovascular: Normal rate, regular rhythm and normal heart sounds.  Pulmonary/Chest: Effort normal. She has rhonchi in the left lower field.  Abdominal: Normal appearance. There is no splenomegaly or hepatomegaly.  Musculoskeletal: Normal range of motion.  Neurological: She is alert and oriented to person, place, and time.  Skin: Skin is warm, dry and intact. No rash noted. No pallor.  Psychiatric: She has a normal mood and affect. Her behavior is normal. Judgment and thought content normal.    Results for orders placed or performed in visit on 01/12/17  CBC With Differential/Platelet  Result Value Ref Range   WBC 7.6 3.4 - 10.8 x10E3/uL   RBC 3.46 (L)  3.77 - 5.28 x10E6/uL   Hemoglobin 10.1 (L) 11.1 - 15.9 g/dL   Hematocrit 31.3 (L) 34.0 - 46.6 %   MCV 91 79 - 97 fL   MCH 29.2 26.6 - 33.0 pg   MCHC 32.3 31.5 - 35.7 g/dL   RDW 13.2 12.3 - 15.4 %   Platelets 563 (H) 150 - 379 x10E3/uL   Neutrophils 65 Not Estab. %   Lymphs 22 Not Estab. %   MID 13 Not Estab. %   Neutrophils Absolute 4.9 1.4 - 7.0 x10E3/uL   Lymphocytes Absolute 1.7 0.7 - 3.1 x10E3/uL   MID (Absolute) 1.0 0.1 - 1.6 X10E3/uL      Assessment & Plan:   Problem List Items Addressed This Visit      Unprioritized   Insomnia    Severe. Stop  anti-cholinergics of Benadryl (Tylenol PM) and Hydoxyzine.  Cut Mirtazipine to 15 mg.  New rx written.  See psychiatry this month.  Start CBT for sleep.  Resources given       Other Visit Diagnoses    Cough    -  Primary   R/o atypical.  Rx for Zithromax       Follow up plan: Return if symptoms worsen or fail to improve.

## 2017-01-31 NOTE — Assessment & Plan Note (Signed)
Severe. Stop anti-cholinergics of Benadryl (Tylenol PM) and Hydoxyzine.  Cut Mirtazipine to 15 mg.  New rx written.  See psychiatry this month.  Start CBT for sleep.  Resources given

## 2017-02-02 ENCOUNTER — Telehealth: Payer: Self-pay | Admitting: *Deleted

## 2017-02-02 NOTE — Telephone Encounter (Signed)
Received referral for initial lung cancer screening scan. Contacted patient and reviewed chart which shows CT angio of chest in July of 2018. Due to this I will contact patient closer to a year from last chest imaging to consider lung screening scan at that time. Patient is in agreement to this plan.

## 2017-02-10 ENCOUNTER — Encounter: Payer: Self-pay | Admitting: Psychiatry

## 2017-02-10 ENCOUNTER — Other Ambulatory Visit: Payer: Self-pay

## 2017-02-10 ENCOUNTER — Ambulatory Visit (INDEPENDENT_AMBULATORY_CARE_PROVIDER_SITE_OTHER): Payer: Federal, State, Local not specified - PPO | Admitting: Psychiatry

## 2017-02-10 VITALS — BP 117/78 | HR 94 | Temp 98.1°F | Wt 169.8 lb

## 2017-02-10 DIAGNOSIS — F331 Major depressive disorder, recurrent, moderate: Secondary | ICD-10-CM

## 2017-02-10 MED ORDER — BUSPIRONE HCL 10 MG PO TABS: 10.0000 mg | ORAL_TABLET | Freq: Two times a day (BID) | ORAL | 1 refills | Status: DC

## 2017-02-10 MED ORDER — TRAZODONE HCL 50 MG PO TABS: 50.0000 mg | ORAL_TABLET | Freq: Every day | ORAL | 1 refills | Status: DC

## 2017-02-10 MED ORDER — MIRTAZAPINE 30 MG PO TABS: 30.0000 mg | ORAL_TABLET | Freq: Every day | ORAL | 1 refills | Status: DC

## 2017-02-10 NOTE — Progress Notes (Signed)
BH MD/PA/NP OP Progress Note  02/10/2017 12:39 PM Chelsey [name]  MRN:  [id number]  Subjective:  Patient is a 67 year old female who presented for the follow-up appointment. She reported that she has been going for physical therapy after she fell on October 15. She reported that she has limited mobility in her shoulder and it is causing her difficulty sleeping at night. She was sleeping in the recliner and now she is unable to sleep in the bed. She reported that she is currently on worker's comp. She appeared anxious during the interview. She reported that her primary care physician has tried her on several different medications to help her sleep including Belsorma and Ambien. They have also decrease the dose of her Remeron. She has become more depressed and the medications are not helping her. She was asking for different  medications to help her with sleep at this time. She stated that they have already stopped her Vistaril as it was not helping her. She appeared anxious and apprehensive as she wants her medications to be adjusted at this time. She denied having any suicidal homicidal ideations or plans. She denied having any perceptual disturbances. Discussed with her about her medications in detail.She denied having any use of pain medications at this time.   Chief Complaint:  Chief Complaint    Follow-up; Insomnia; Medication Refill     Visit Diagnosis:     ICD-10-CM   1. MDD (major depressive disorder), recurrent episode, moderate ([place]) F33.1     Past Medical History:  Past Medical History:  Diagnosis Date  . Breast cancer ([place]) 1997   right breast, radiation  . Bronchitis    recent/ 06/09/15 [facility]/resolved  . Cancer [facility]) 1997   right lumpectomy,L/Ad/R   . GERD (gastroesophageal reflux disease)   . Hypercholesteremia   . Hyperlipidemia   . Panic attack   . Personal history of malignant neoplasm of breast     Past Surgical History:  Procedure  Laterality Date  . BICEPT TENODESIS Left 11/23/2016   Procedure: BICEPS TENODESIS;  Surgeon: Leim Fabry, MD;  Location: [facility];  Service: Orthopedics;  Laterality: Left;  . BREAST EXCISIONAL BIOPSY Right 1997   pos  . BREAST SURGERY Right 1997   lumpectomy  . CHOLECYSTECTOMY N/A 08/06/2016   Procedure: LAPAROSCOPIC CHOLECYSTECTOMY WITH INTRAOPERATIVE CHOLANGIOGRAM;  Surgeon: Christene Lye, MD;  Location: [facility];  Service: General;  Laterality: N/A;  . COLONOSCOPY  2008  . COLONOSCOPY WITH PROPOFOL N/A 07/21/2015   Procedure: COLONOSCOPY WITH PROPOFOL;  Surgeon: Lucilla Lame, MD;  Location: [place];  Service: Endoscopy;  Laterality: N/A;  . ESOPHAGOGASTRODUODENOSCOPY (EGD) WITH PROPOFOL N/A 06/16/2016   Procedure: ESOPHAGOGASTRODUODENOSCOPY (EGD) WITH PROPOFOL;  Surgeon: Christene Lye, MD;  Location: [facility];  Service: Endoscopy;  Laterality: N/A;  . HARDWARE REMOVAL Left 12/27/2016   Procedure: HARDWARE REMOVAL LEFT  PROXIMAL HUMEROUS;  Surgeon: Leim Fabry, MD;  Location: [facility];  Service: Orthopedics;  Laterality: Left;  . ORIF HUMERUS FRACTURE Left 11/23/2016   Procedure: OPEN REDUCTION INTERNAL FIXATION (ORIF) PROXIMAL HUMERUS FRACTURE;  Surgeon: Leim Fabry, MD;  Location: [facility];  Service: Orthopedics;  Laterality: Left;  . REVERSE SHOULDER ARTHROPLASTY Left 12/27/2016   Procedure: REVERSE SHOULDER ARTHROPLASTY;  Surgeon: Leim Fabry, MD;  Location: [facility];  Service: Orthopedics;  Laterality: Left;   Family History:  Family History  Problem Relation Age of Onset  . Anxiety disorder Brother   . Obesity Brother   .  COPD Brother   . Kidney disease Mother   . Heart attack Father   . Hypertension Father   . Alcohol abuse Father   . Depression Brother   . Anxiety disorder Brother   . Breast cancer Maternal Grandmother    Social History:  Social History   Socioeconomic History  . Marital status: Widowed    Spouse name: None  .  Number of children: 2  . Years of education: None  . Highest education level: Bachelor's degree (e.g., BA, AB, BS)  Social Needs  . Financial resource strain: Hard  . Food insecurity - worry: Never true  . Food insecurity - inability: Never true  . Transportation needs - medical: No  . Transportation needs - non-medical: No  Occupational History  . None  Tobacco Use  . Smoking status: Current Every Day Smoker    Packs/day: 0.25    Years: 5.00    Pack years: 1.25    Types: Cigarettes    Start date: 11/25/2009    Last attempt to quit: 03/09/2016    Years since quitting: 0.9  . Smokeless tobacco: Never Used  . Tobacco comment: Has tried patches and may go back on these again.  Says they work.   Substance and Sexual Activity  . Alcohol use: No    Alcohol/week: 0.0 oz    Comment: rarely  . Drug use: No  . Sexual activity: No  Other Topics Concern  . None  Social History Narrative  . None   Additional History:  Currently her son is living with her and patient is trying to help him move out into an apartment. 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  Assessment:   Musculoskeletal: Strength & Muscle Tone: within normal limits Gait & Station: normal Patient leans: N/A  Psychiatric Specialty Exam: Medication Refill     Review of Systems  Psychiatric/Behavioral: Positive for depression. The patient is nervous/anxious.     Blood pressure 117/78, pulse 94, temperature 98.1 F (36.7 C), temperature source Oral, weight 169 lb 12.8 oz (77 kg).Body mass index is 28.26 kg/m.  General Appearance: Casual  Eye Contact:  Good  Speech:  Clear and Coherent  Volume:  Normal  Mood:  Euthymic  Affect:  Congruent  Thought Process:  Coherent  Orientation:  Full (Time, Place, and Person)  Thought Content:  WDL  Suicidal Thoughts:  No  Homicidal Thoughts:  No  Memory:  Immediate;   Fair  Judgement:  Fair  Insight:  Fair  Psychomotor Activity:  Decreased   Concentration:  Fair  Recall:  [employer] of Knowledge: Fair  Language: Fair  Akathisia:  No  Handed:  Right  AIMS (if indicated):    Assets:  Communication Skills Desire for Improvement Social Support  ADL's:  Intact  Cognition: WNL  Sleep: 6- 7 with trazodone    Is the patient at risk to self?  No. Has the patient been a risk to self in the past 6 months?  No. Has the patient been a risk to self within the distant past?  No. Is the patient a risk to others?  No. Has the patient been a risk to others in the past 6 months?  No. Has the patient been a risk to others within the distant past?  No.  Current Medications: Current Outpatient Medications  Medication Sig Dispense Refill  . acetaminophen (TYLENOL) 500 MG tablet Take 500 mg every 6 (six) hours as needed  by mouth for moderate pain or headache.    . alendronate (FOSAMAX) 70 MG tablet Take 1 tablet (70 mg total) by mouth once a week. (Patient taking differently: Take 70 mg every Sunday by mouth. ) 12 tablet 3  . aspirin EC 325 MG EC tablet Take 1 tablet (325 mg total) by mouth daily. 30 tablet 0  . busPIRone (BUSPAR) 10 MG tablet Take 1 tablet (10 mg total) by mouth 2 (two) times daily. 180 tablet 1  . Calcium Carbonate-Vitamin D (CALCIUM + D PO) Take 1 tablet daily by mouth.     . Cholecalciferol (VITAMIN D) 2000 units tablet Take 2,000 Units by mouth daily.    . Multiple Vitamin (MULTIVITAMIN) tablet Take 1 tablet daily by mouth.     Marland Kitchen omeprazole (PRILOSEC) 20 MG capsule Take 1 capsule (20 mg total) by mouth 2 (two) times daily before a meal. 90 capsule 3  . simvastatin (ZOCOR) 20 MG tablet Take 1 tablet (20 mg total) by mouth at bedtime. bedtime 90 tablet 3  . zolpidem (AMBIEN) 10 MG tablet Take 1 tablet (10 mg total) by mouth at bedtime as needed for sleep. 15 tablet 1  . mirtazapine (REMERON) 30 MG tablet Take 1 tablet (30 mg total) by mouth at bedtime. 90 tablet 1  . traZODone (DESYREL) 50 MG tablet Take 1 tablet (50 mg  total) by mouth at bedtime. 30 tablet 1   No current facility-administered medications for this visit.     Medical Decision Making:  Review of Psycho-Social Stressors (1) and Review and summation of old records (2)  Treatment Plan Summary:Medication management   Continue medications as follows  Depression  She will also continue on Remeron 30 mg by mouth daily at bedtime . BuSpar 10 mg by mouth 3 times a day I will start her on trazodone 50 mg at bedtime to help with insomnia. Advised her to take Ambien 5 mg by mouth daily at bedtime when necessary and she agreed with the plan     More than 50% of the time spent in psychoeducation, counseling and coordination of care.   Follow-up in 1 months or earlier depending on her symptoms  This note was generated in part or whole with voice recognition software. Voice regonition is usually quite accurate but there are transcription errors that can and very often do occur. I apologize for any typographical errors that were not detected and corrected.    Rainey Pines, MD    02/10/2017, 12:39 PM

## 2017-02-14 ENCOUNTER — Ambulatory Visit: Payer: No Typology Code available for payment source | Admitting: Psychiatry

## 2017-02-14 DIAGNOSIS — Z96612 Presence of left artificial shoulder joint: Secondary | ICD-10-CM | POA: Insufficient documentation

## 2017-02-17 ENCOUNTER — Encounter: Payer: Self-pay | Admitting: Emergency Medicine

## 2017-02-17 ENCOUNTER — Emergency Department
Admission: EM | Admit: 2017-02-17 | Discharge: 2017-02-18 | Disposition: A | Discharge status: Home / Self Care | Payer: Federal, State, Local not specified - PPO | Attending: Emergency Medicine | Admitting: Emergency Medicine

## 2017-02-17 DIAGNOSIS — F17210 Nicotine dependence, cigarettes, uncomplicated: Secondary | ICD-10-CM | POA: Diagnosis not present

## 2017-02-17 DIAGNOSIS — Z7982 Long term (current) use of aspirin: Secondary | ICD-10-CM | POA: Insufficient documentation

## 2017-02-17 DIAGNOSIS — R451 Restlessness and agitation: Secondary | ICD-10-CM | POA: Diagnosis present

## 2017-02-17 DIAGNOSIS — Z79899 Other long term (current) drug therapy: Secondary | ICD-10-CM | POA: Diagnosis not present

## 2017-02-17 DIAGNOSIS — R202 Paresthesia of skin: Secondary | ICD-10-CM | POA: Diagnosis not present

## 2017-02-17 DIAGNOSIS — Z853 Personal history of malignant neoplasm of breast: Secondary | ICD-10-CM | POA: Diagnosis not present

## 2017-02-17 DIAGNOSIS — F419 Anxiety disorder, unspecified: Secondary | ICD-10-CM | POA: Diagnosis not present

## 2017-02-17 LAB — BASIC METABOLIC PANEL
Anion gap: 6 (ref 5–15)
BUN: 10 mg/dL (ref 6–20)
CALCIUM: 9.2 mg/dL (ref 8.9–10.3)
CO2: 28 mmol/L (ref 22–32)
CREATININE: 0.7 mg/dL (ref 0.44–1.00)
Chloride: 106 mmol/L (ref 101–111)
GFR calc Af Amer: >60 mL/min (ref >60)
GLUCOSE: 116 mg/dL — AB (ref 65–99)
Potassium: 3.8 mmol/L (ref 3.5–5.1)
Sodium: 140 mmol/L (ref 135–145)

## 2017-02-17 LAB — CBC
HCT: 35.1 % (ref 35.0–47.0)
Hemoglobin: 11.4 g/dL — ABNORMAL LOW (ref 12.0–16.0)
MCH: 26.6 pg (ref 26.0–34.0)
MCHC: 32.5 g/dL (ref 32.0–36.0)
MCV: 82 fL (ref 80.0–100.0)
PLATELETS: 321 10*3/uL (ref 150–440)
RBC: 4.28 MIL/uL (ref 3.80–5.20)
RDW: 16.6 % — AB (ref 11.5–14.5)
WBC: 5.5 10*3/uL (ref 3.6–11.0)

## 2017-02-17 LAB — TSH: TSH: 2.217 u[IU]/mL (ref 0.350–4.500)

## 2017-02-17 MED ORDER — LORAZEPAM 1 MG PO TABS
1.0000 mg | ORAL_TABLET | Freq: Once | ORAL | Status: AC
  Administered 2017-02-17: 1 mg via ORAL
  Filled 2017-02-17: qty 1

## 2017-02-17 NOTE — ED Notes (Signed)
SOC camera set-up in pt's room at this time.

## 2017-02-17 NOTE — ED Provider Notes (Signed)
[facility] Emergency Department Provider Note  Time seen: 10:06 PM  I have reviewed the triage vital signs and the nursing notes.   HISTORY  Chief Complaint Panic Attack    HPI Chelsey [name] is a 67 y.o. female with a past medical history of gastric reflux, hyperlipidemia, panic attacks, presents to the emergency department for increased anxiety over the past 1 week.  According to the patient over the past 1 week she has been feeling very anxious with symptoms of tingling in her hands, feeling like she cannot get a full deep breath, states she has been very jittery and restless with prickly skin.  States a history of anxiety but over the past 1 week he has been very significant and is now interfering with her life.  Called her psychiatrist but she could not be seen until at least next week so she was referred to the emergency department.  She denies any SI or HI.  Patient is cooperative but she does appear moderately anxious throughout the examination.  Denies alcohol or drug use.  Denies any medical complaints.  Largely negative review of systems.   Past Medical History:  Diagnosis Date  . Breast cancer ([place]) 1997   right breast, radiation  . Bronchitis    recent/ 06/09/15 [facility]/resolved  . Cancer [facility]) 1997   right lumpectomy,L/Ad/R   . GERD (gastroesophageal reflux disease)   . Hypercholesteremia   . Hyperlipidemia   . Panic attack   . Personal history of malignant neoplasm of breast     Patient Active Problem List   Diagnosis Date Noted  . Insomnia 01/12/2017  . Anemia 01/12/2017  . Status post shoulder replacement 12/27/2016  . Pressure injury of skin 12/27/2016  . S/P ORIF (open reduction internal fixation) fracture 11/23/2016  . Proximal humerus fracture 11/23/2016  . Panic attack 08/17/2016  . Abdominal pain 05/21/2016  . Special screening for malignant neoplasms, colon   . Benign neoplasm of descending colon    . Benign neoplasm of sigmoid colon   . Gastroesophageal reflux disease without esophagitis 06/13/2014  . H/O hypercholesterolemia 06/13/2014  . Depression, major, recurrent, moderate ([place]) 06/13/2014  . History of breast cancer 03/12/2013    Past Surgical History:  Procedure Laterality Date  . BICEPT TENODESIS Left 11/23/2016   Procedure: BICEPS TENODESIS;  Surgeon: Leim Fabry, MD;  Location: [facility];  Service: Orthopedics;  Laterality: Left;  . BREAST EXCISIONAL BIOPSY Right 1997   pos  . BREAST SURGERY Right 1997   lumpectomy  . CHOLECYSTECTOMY N/A 08/06/2016   Procedure: LAPAROSCOPIC CHOLECYSTECTOMY WITH INTRAOPERATIVE CHOLANGIOGRAM;  Surgeon: Christene Lye, MD;  Location: [facility];  Service: General;  Laterality: N/A;  . COLONOSCOPY  2008  . COLONOSCOPY WITH PROPOFOL N/A 07/21/2015   Procedure: COLONOSCOPY WITH PROPOFOL;  Surgeon: Lucilla Lame, MD;  Location: [place];  Service: Endoscopy;  Laterality: N/A;  . ESOPHAGOGASTRODUODENOSCOPY (EGD) WITH PROPOFOL N/A 06/16/2016   Procedure: ESOPHAGOGASTRODUODENOSCOPY (EGD) WITH PROPOFOL;  Surgeon: Christene Lye, MD;  Location: [facility];  Service: Endoscopy;  Laterality: N/A;  . HARDWARE REMOVAL Left 12/27/2016   Procedure: HARDWARE REMOVAL LEFT  PROXIMAL HUMEROUS;  Surgeon: Leim Fabry, MD;  Location: [facility];  Service: Orthopedics;  Laterality: Left;  . ORIF HUMERUS FRACTURE Left 11/23/2016   Procedure: OPEN REDUCTION INTERNAL FIXATION (ORIF) PROXIMAL HUMERUS FRACTURE;  Surgeon: Leim Fabry, MD;  Location: [facility];  Service: Orthopedics;  Laterality: Left;  . REVERSE SHOULDER ARTHROPLASTY  Left 12/27/2016   Procedure: REVERSE SHOULDER ARTHROPLASTY;  Surgeon: Leim Fabry, MD;  Location: [facility];  Service: Orthopedics;  Laterality: Left;    Prior to Admission medications   Medication Sig Start Date End Date Taking? Authorizing Provider  acetaminophen (TYLENOL) 500 MG tablet Take 500 mg every 6 (six)  hours as needed by mouth for moderate pain or headache.    [provider]  alendronate (FOSAMAX) 70 MG tablet Take 1 tablet (70 mg total) by mouth once a week. Patient taking differently: Take 70 mg every Sunday by mouth.  07/05/16   Kathrine Haddock, NP  aspirin EC 325 MG EC tablet Take 1 tablet (325 mg total) by mouth daily. 11/24/16   Duanne Guess, PA-C  busPIRone (BUSPAR) 10 MG tablet Take 1 tablet (10 mg total) by mouth 2 (two) times daily. 02/10/17   Rainey Pines, MD  Calcium Carbonate-Vitamin D (CALCIUM + D PO) Take 1 tablet daily by mouth.     [provider]  Cholecalciferol (VITAMIN D) 2000 units tablet Take 2,000 Units by mouth daily.    [provider]  mirtazapine (REMERON) 30 MG tablet Take 1 tablet (30 mg total) by mouth at bedtime. 02/10/17   Rainey Pines, MD  Multiple Vitamin (MULTIVITAMIN) tablet Take 1 tablet daily by mouth.     [provider]  omeprazole (PRILOSEC) 20 MG capsule Take 1 capsule (20 mg total) by mouth 2 (two) times daily before a meal. 07/05/16   Kathrine Haddock, NP  simvastatin (ZOCOR) 20 MG tablet Take 1 tablet (20 mg total) by mouth at bedtime. bedtime 01/12/17   Kathrine Haddock, NP  traZODone (DESYREL) 50 MG tablet Take 1 tablet (50 mg total) by mouth at bedtime. 02/10/17   Rainey Pines, MD  zolpidem (AMBIEN) 10 MG tablet Take 1 tablet (10 mg total) by mouth at bedtime as needed for sleep. 01/28/17 02/27/17  Kathrine Haddock, NP    No Known Allergies  Family History  Problem Relation Age of Onset  . Anxiety disorder Brother   . Obesity Brother   . COPD Brother   . Kidney disease Mother   . Heart attack Father   . Hypertension Father   . Alcohol abuse Father   . Depression Brother   . Anxiety disorder Brother   . Breast cancer Maternal Grandmother     Social History Social History   Tobacco Use  . Smoking status: Current Every Day Smoker    Packs/day: 0.25    Years: 5.00    Pack years: 1.25    Types: Cigarettes     Start date: 11/25/2009    Last attempt to quit: 03/09/2016    Years since quitting: 0.9  . Smokeless tobacco: Never Used  . Tobacco comment: Has tried patches and may go back on these again.  Says they work.   Substance Use Topics  . Alcohol use: No    Alcohol/week: 0.0 oz    Comment: rarely  . Drug use: No    Review of Systems Constitutional: Negative for fever. Eyes: Negative for visual complaints ENT: Negative for recent illness/congestion Cardiovascular: Negative for chest pain. Respiratory: Feeling like she cannot get a full deep breath of air. Gastrointestinal: Negative for abdominal pain, vomiting Genitourinary: Negative for urinary compaints Musculoskeletal: Negative for musculoskeletal complaints Skin: It feels like her skin is "prickly." Neurological: Negative for headache All other ROS negative  ____________________________________________   PHYSICAL EXAM:  VITAL SIGNS: ED Triage Vitals  Enc Vitals Group  BP 02/17/17 1918 116/78     Pulse Rate 02/17/17 1918 77     Resp 02/17/17 1918 17     Temp 02/17/17 1918 98.2 F (36.8 C)     Temp Source 02/17/17 1918 Oral     SpO2 02/17/17 1918 98 %     Weight 02/17/17 1920 165 lb (74.8 kg)     Height 02/17/17 1920 5\' 5"  (1.651 m)     Head Circumference --      Peak Flow --      Pain Score --      Pain Loc --      Pain Edu? --      Excl. in [place]? --    Constitutional: Alert and oriented.  Moderately anxious appearing.  Restless. Eyes: Normal exam ENT   Head: Normocephalic and atraumatic   Mouth/Throat: Mucous membranes are moist. Cardiovascular: Normal rate, regular rhythm. No murmur Respiratory: Normal respiratory effort without tachypnea nor retractions. Breath sounds are clea Gastrointestinal: Soft and nontender. No distention. Musculoskeletal: Nontender with normal range of motion in all extremities. Neurologic:  Normal speech and language. No gross focal neurologic deficits Skin:  Skin is warm, dry  and intact.  Psychiatric: Moderately anxious appearing.  Denies SI or HI.  ____________________________________________   INITIAL IMPRESSION / ASSESSMENT AND PLAN / ED COURSE  Pertinent labs & imaging results that were available during my care of the patient were reviewed by me and considered in my medical decision making (see chart for details).  Patient presents to the emergency department for anxiety symptoms times 1 week.  Patient states for the past 1 week she has intermittently become short of breath feeling like she cannot get a deep breath of air, states she has been very restless, has tingling in her hands at times.  States a history of anxiety to which this feels identical, states it has been much worse over the past 1 week denies any new medications or new stressors.  Denies drug or alcohol use.  Cannot be seen by her psychiatrist for at least 1 week.  Here patient is mildly anxious appearing we will check labs including TSH.  We will dose 1 mg of oral Ativan.  Patient strongly wishes to speak to a psychiatrist.  I discussed with the patient that we do not have an in-house psychiatrist available until tomorrow, she would like to speak to the telemetry psychiatrist.  I have placed a telemetry psychiatry was consult for the patient.  She does not meet IVC criteria, no SI or HI, here voluntarily at this time.  Patient's workup including TSH is normal.  Telemetry psychiatry consultation pending.  ____________________________________________   FINAL CLINICAL IMPRESSION(S) / ED DIAGNOSES  Anxiety    Harvest Dark, MD 02/17/17 [id number]

## 2017-02-17 NOTE — ED Triage Notes (Signed)
Pt comes into the ED via POV c/o panic attacks that have been going on all week.  Patient states she called her psychiatrist who told her to come in and be evaluated.  Patient does take Ambien and trazodone but doesn't seem to be helping.  Patient states she feels anxious at this moment and she feels tired and restless.  Patient in NAD with even and unlabored respirations at this time. Patient denies any current SI or HI.

## 2017-02-17 NOTE — ED Notes (Signed)
SOC called for consult [id number]

## 2017-02-18 MED ORDER — TRAZODONE HCL 50 MG PO TABS: 50.0000 mg | ORAL_TABLET | Freq: Every day | ORAL | 1 refills | Status: DC

## 2017-02-18 MED ORDER — BUSPIRONE HCL 10 MG PO TABS: 10.0000 mg | ORAL_TABLET | Freq: Three times a day (TID) | ORAL | 0 refills | Status: DC

## 2017-02-18 MED ORDER — BUSPIRONE HCL 10 MG PO TABS: 10.0000 mg | ORAL_TABLET | Freq: Two times a day (BID) | ORAL | 1 refills | Status: DC

## 2017-02-18 MED ORDER — TRAZODONE HCL 100 MG PO TABS: 100.0000 mg | ORAL_TABLET | Freq: Every day | ORAL | 0 refills | Status: DC

## 2017-02-18 NOTE — Discharge Instructions (Signed)
You were seen by the tele-psychiatrist who recommends the following changes in your medications:  1.  Increase trazodone to 100 mg nightly if you have insomnia  2.  Do not take mirtazapine and Ambien 3.  Increase buspirone to 10 mg 3 times daily 4.  Continue hydroxyzine  Please follow-up with your psychiatrist next week as scheduled.  Return to the ER for worsening symptoms, feelings of hurting yourself or others, or other concerns.

## 2017-02-18 NOTE — ED Provider Notes (Signed)
-----------------------------------------   12:46 AM on 02/18/2017 -----------------------------------------  Patient was evaluated by [facility] psychiatrist Dr. Norma Fredrickson who deems patient psychiatrically stable for discharge home.  Medication recommendations include:  1. Increasing trazodone to 100 mg PO qhs PRN insomnia  2.  Hold mirtazapine and Ambien  3.  Increase buspirone to 10 mg PO tid  4.  Continue hydroxyzine 25 mg PO bid PRN  Patient has a follow-up appointment with her psychiatrist Dr. Gretel Acre next week.  A 7-day prescription was written for the increased doses of trazodone and buspirone.  She does not meet criteria for involuntary commitment and may be safely discharged home.  Strict return precautions given.  Patient verbalizes understanding and agrees with plan of care.   Paulette Blanch, MD 02/18/17 [phone]

## 2017-02-21 ENCOUNTER — Encounter: Payer: Self-pay | Admitting: Psychiatry

## 2017-02-21 ENCOUNTER — Ambulatory Visit: Payer: Federal, State, Local not specified - PPO | Admitting: Psychiatry

## 2017-02-21 ENCOUNTER — Other Ambulatory Visit: Payer: Self-pay

## 2017-02-21 VITALS — BP 117/75 | HR 97 | Temp 97.9°F | Wt 167.2 lb

## 2017-02-21 DIAGNOSIS — F331 Major depressive disorder, recurrent, moderate: Secondary | ICD-10-CM

## 2017-02-21 MED ORDER — PAROXETINE HCL 20 MG PO TABS: 20.0000 mg | ORAL_TABLET | Freq: Every day | ORAL | 1 refills | Status: DC

## 2017-02-21 MED ORDER — PAROXETINE HCL 10 MG PO TABS: 20.0000 mg | ORAL_TABLET | Freq: Every day | ORAL | 1 refills | Status: DC

## 2017-02-21 MED ORDER — TRAZODONE HCL 50 MG PO TABS: 50.0000 mg | ORAL_TABLET | Freq: Every day | ORAL | 1 refills | Status: DC

## 2017-02-21 MED ORDER — BUSPIRONE HCL 10 MG PO TABS: 10.0000 mg | ORAL_TABLET | Freq: Two times a day (BID) | ORAL | 1 refills | Status: DC

## 2017-02-21 NOTE — Progress Notes (Signed)
BH MD/PA/NP OP Progress Note  02/21/2017 3:54 PM Chelsey [name]  MRN:  [id number]  Subjective:  Patient is a 67 year old female who presented for the follow-up appointment. She reported that she went to the emergency room on Thursday as she was having increased anxiety and was unable to control herself. She was seen by [facility] psychiatry and advised her to stop taking the mirtazapine and increase the dose of trazodone 100 mg at bedtime. She was also given hydroxyzine. She reported that she is feeling very tired and has been feeling hopeless. She reported that she has relationship issues with her son who has been drinking extensively specially on the weekends. She is also concerned about her shoulder and physical therapy. She reported that she does not have any suicidal ideations or plans. She does not eat well and has been losing weight. We discussed about changing her medications and she agreed with the plan. She is willing to try Paxil at this time. She denied having any suicidal homicidal ideations or plans.     Chief Complaint:  Chief Complaint    Follow-up; Medication Refill     Visit Diagnosis:     ICD-10-CM   1. MDD (major depressive disorder), recurrent episode, moderate ([place]) F33.1     Past Medical History:  Past Medical History:  Diagnosis Date  . Breast cancer ([place]) 1997   right breast, radiation  . Bronchitis    recent/ 06/09/15 [facility]/resolved  . Cancer [facility]) 1997   right lumpectomy,L/Ad/R   . GERD (gastroesophageal reflux disease)   . Hypercholesteremia   . Hyperlipidemia   . Panic attack   . Personal history of malignant neoplasm of breast     Past Surgical History:  Procedure Laterality Date  . BICEPT TENODESIS Left 11/23/2016   Procedure: BICEPS TENODESIS;  Surgeon: Leim Fabry, MD;  Location: [facility];  Service: Orthopedics;  Laterality: Left;  . BREAST EXCISIONAL BIOPSY Right 1997   pos  . BREAST SURGERY Right 1997   lumpectomy  .  CHOLECYSTECTOMY N/A 08/06/2016   Procedure: LAPAROSCOPIC CHOLECYSTECTOMY WITH INTRAOPERATIVE CHOLANGIOGRAM;  Surgeon: Christene Lye, MD;  Location: [facility];  Service: General;  Laterality: N/A;  . COLONOSCOPY  2008  . COLONOSCOPY WITH PROPOFOL N/A 07/21/2015   Procedure: COLONOSCOPY WITH PROPOFOL;  Surgeon: Lucilla Lame, MD;  Location: [place];  Service: Endoscopy;  Laterality: N/A;  . ESOPHAGOGASTRODUODENOSCOPY (EGD) WITH PROPOFOL N/A 06/16/2016   Procedure: ESOPHAGOGASTRODUODENOSCOPY (EGD) WITH PROPOFOL;  Surgeon: Christene Lye, MD;  Location: [facility];  Service: Endoscopy;  Laterality: N/A;  . HARDWARE REMOVAL Left 12/27/2016   Procedure: HARDWARE REMOVAL LEFT  PROXIMAL HUMEROUS;  Surgeon: Leim Fabry, MD;  Location: [facility];  Service: Orthopedics;  Laterality: Left;  . ORIF HUMERUS FRACTURE Left 11/23/2016   Procedure: OPEN REDUCTION INTERNAL FIXATION (ORIF) PROXIMAL HUMERUS FRACTURE;  Surgeon: Leim Fabry, MD;  Location: [facility];  Service: Orthopedics;  Laterality: Left;  . REVERSE SHOULDER ARTHROPLASTY Left 12/27/2016   Procedure: REVERSE SHOULDER ARTHROPLASTY;  Surgeon: Leim Fabry, MD;  Location: [facility];  Service: Orthopedics;  Laterality: Left;   Family History:  Family History  Problem Relation Age of Onset  . Anxiety disorder Brother   . Obesity Brother   . COPD Brother   . Kidney disease Mother   . Heart attack Father   . Hypertension Father   . Alcohol abuse Father   . Depression Brother   . Anxiety disorder Brother   . Breast  cancer Maternal Grandmother    Social History:  Social History   Socioeconomic History  . Marital status: Widowed    Spouse name: None  . Number of children: 2  . Years of education: None  . Highest education level: Bachelor's degree (e.g., BA, AB, BS)  Social Needs  . Financial resource strain: Hard  . Food insecurity - worry: Never true  . Food insecurity - inability: Never true  . Transportation  needs - medical: No  . Transportation needs - non-medical: No  Occupational History  . None  Tobacco Use  . Smoking status: Current Every Day Smoker    Packs/day: 0.25    Years: 5.00    Pack years: 1.25    Types: Cigarettes    Start date: 11/25/2009    Last attempt to quit: 03/09/2016    Years since quitting: 0.9  . Smokeless tobacco: Never Used  . Tobacco comment: Has tried patches and may go back on these again.  Says they work.   Substance and Sexual Activity  . Alcohol use: No    Alcohol/week: 0.0 oz    Comment: rarely  . Drug use: No  . Sexual activity: No  Other Topics Concern  . None  Social History Narrative  . None   Additional History:  Currently her son is living with her and patient is trying to help him move out into an apartment. 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  Assessment:   Musculoskeletal: Strength & Muscle Tone: within normal limits Gait & Station: normal Patient leans: N/A  Psychiatric Specialty Exam: Medication Refill     Review of Systems  Psychiatric/Behavioral: Positive for depression. The patient is nervous/anxious.     Blood pressure 117/75, pulse 97, temperature 97.9 F (36.6 C), temperature source Oral, weight 167 lb 3.2 oz (75.8 kg).Body mass index is 27.82 kg/m.  General Appearance: Casual  Eye Contact:  Good  Speech:  Clear and Coherent  Volume:  Normal  Mood:  Anxious, Depressed and Dysphoric  Affect:  Congruent  Thought Process:  Coherent  Orientation:  Full (Time, Place, and Person)  Thought Content:  WDL  Suicidal Thoughts:  No  Homicidal Thoughts:  No  Memory:  Immediate;   Fair  Judgement:  Fair  Insight:  Fair  Psychomotor Activity:  Decreased  Concentration:  Fair  Recall:  [employer] of Knowledge: Fair  Language: Fair  Akathisia:  No  Handed:  Right  AIMS (if indicated):    Assets:  Communication Skills Desire for Improvement Social Support  ADL's:  Intact  Cognition: WNL  Sleep:  6- 7 with trazodone    Is the patient at risk to self?  No. Has the patient been a risk to self in the past 6 months?  No. Has the patient been a risk to self within the distant past?  No. Is the patient a risk to others?  No. Has the patient been a risk to others in the past 6 months?  No. Has the patient been a risk to others within the distant past?  No.  Current Medications: Current Outpatient Medications  Medication Sig Dispense Refill  . acetaminophen (TYLENOL) 500 MG tablet Take 500 mg every 6 (six) hours as needed by mouth for moderate pain or headache.    . alendronate (FOSAMAX) 70 MG tablet Take 1 tablet (70 mg total) by mouth once a week. (Patient taking differently: Take 70 mg every Sunday by mouth. )  12 tablet 3  . aspirin EC 325 MG EC tablet Take 1 tablet (325 mg total) by mouth daily. 30 tablet 0  . busPIRone (BUSPAR) 10 MG tablet Take 1 tablet (10 mg total) by mouth 2 (two) times daily. 60 tablet 1  . Calcium Carbonate-Vitamin D (CALCIUM + D PO) Take 1 tablet daily by mouth.     . Cholecalciferol (VITAMIN D) 2000 units tablet Take 2,000 Units by mouth daily.    . Multiple Vitamin (MULTIVITAMIN) tablet Take 1 tablet daily by mouth.     Marland Kitchen omeprazole (PRILOSEC) 20 MG capsule Take 1 capsule (20 mg total) by mouth 2 (two) times daily before a meal. 90 capsule 3  . simvastatin (ZOCOR) 20 MG tablet Take 1 tablet (20 mg total) by mouth at bedtime. bedtime 90 tablet 3  . traZODone (DESYREL) 50 MG tablet Take 1 tablet (50 mg total) by mouth at bedtime. 30 tablet 1  . PARoxetine (PAXIL) 20 MG tablet Take 1 tablet (20 mg total) by mouth daily. 30 tablet 1   No current facility-administered medications for this visit.     Medical Decision Making:  Review of Psycho-Social Stressors (1) and Review and summation of old records (2)  Treatment Plan Summary:Medication management   Continue medications as follows  Discontinue Remeron. will start her on Paxil 20 mg by mouth daily at  bedtime Continue trazodone 50 mg at bedtime. Continue BuSpar 10 mg by mouth twice a day Hydroxyzine when necessary at bedtime.    More than 50% of the time spent in psychoeducation, counseling and coordination of care.   Follow-up in 1 months or earlier depending on her symptoms  This note was generated in part or whole with voice recognition software. Voice regonition is usually quite accurate but there are transcription errors that can and very often do occur. I apologize for any typographical errors that were not detected and corrected.    Rainey Pines, MD    02/21/2017, 3:54 PM

## 2017-03-14 ENCOUNTER — Ambulatory Visit: Payer: No Typology Code available for payment source | Admitting: Psychiatry

## 2017-03-14 ENCOUNTER — Other Ambulatory Visit: Payer: Self-pay

## 2017-03-14 ENCOUNTER — Encounter: Payer: Self-pay | Admitting: Psychiatry

## 2017-03-14 VITALS — BP 106/72 | HR 103 | Temp 97.3°F | Wt 165.6 lb

## 2017-03-14 DIAGNOSIS — F331 Major depressive disorder, recurrent, moderate: Secondary | ICD-10-CM | POA: Diagnosis not present

## 2017-03-14 MED ORDER — TRAZODONE HCL 100 MG PO TABS: 100.0000 mg | ORAL_TABLET | Freq: Every day | ORAL | 2 refills | Status: DC

## 2017-03-14 MED ORDER — PAROXETINE HCL 20 MG PO TABS: 20.0000 mg | ORAL_TABLET | Freq: Every day | ORAL | 1 refills | Status: DC

## 2017-03-14 MED ORDER — BUSPIRONE HCL 10 MG PO TABS: 10.0000 mg | ORAL_TABLET | Freq: Two times a day (BID) | ORAL | 1 refills | Status: DC

## 2017-03-14 NOTE — Progress Notes (Signed)
BH MD/PA/NP OP Progress Note  03/14/2017 11:58 AM Chelsey [name]  MRN:  [id number]  Subjective:  Patient is a 67 year old female who presented for the follow-up appointment. She reported that she has been doing well and has started taking the Paxil. She reported that she feels more anxious and apprehensive due to  her sleeping. She has been sleeping with the help of trazodone but it takes her time to go to sleep. We discussed about adjusting her medications. She is receptive to the same. She is compliant with her medications. She reported that her son continues to drink on a regular basis and she was drunk over the weekend. She is concerned about his behavior but he is not interested in going to the rehabilitation program. She reported that she is also going to the rehabilitation for her shoulder surgery and has been doing some exercise at home.  She is compliant with her medications. She denied having any side effects. She denied having any suicidal homicidal ideations or plans.   .     Chief Complaint:  Chief Complaint    Follow-up; Medication Refill     Visit Diagnosis:     ICD-10-CM   1. MDD (major depressive disorder), recurrent episode, moderate ([place]) F33.1     Past Medical History:  Past Medical History:  Diagnosis Date  . Breast cancer ([place]) 1997   right breast, radiation  . Bronchitis    recent/ 06/09/15 [facility]/resolved  . Cancer [facility]) 1997   right lumpectomy,L/Ad/R   . GERD (gastroesophageal reflux disease)   . Hypercholesteremia   . Hyperlipidemia   . Panic attack   . Personal history of malignant neoplasm of breast     Past Surgical History:  Procedure Laterality Date  . BICEPT TENODESIS Left 11/23/2016   Procedure: BICEPS TENODESIS;  Surgeon: Leim Fabry, MD;  Location: [facility];  Service: Orthopedics;  Laterality: Left;  . BREAST EXCISIONAL BIOPSY Right 1997   pos  . BREAST SURGERY Right 1997   lumpectomy  . CHOLECYSTECTOMY N/A  08/06/2016   Procedure: LAPAROSCOPIC CHOLECYSTECTOMY WITH INTRAOPERATIVE CHOLANGIOGRAM;  Surgeon: Christene Lye, MD;  Location: [facility];  Service: General;  Laterality: N/A;  . COLONOSCOPY  2008  . COLONOSCOPY WITH PROPOFOL N/A 07/21/2015   Procedure: COLONOSCOPY WITH PROPOFOL;  Surgeon: Lucilla Lame, MD;  Location: [place];  Service: Endoscopy;  Laterality: N/A;  . ESOPHAGOGASTRODUODENOSCOPY (EGD) WITH PROPOFOL N/A 06/16/2016   Procedure: ESOPHAGOGASTRODUODENOSCOPY (EGD) WITH PROPOFOL;  Surgeon: Christene Lye, MD;  Location: [facility];  Service: Endoscopy;  Laterality: N/A;  . HARDWARE REMOVAL Left 12/27/2016   Procedure: HARDWARE REMOVAL LEFT  PROXIMAL HUMEROUS;  Surgeon: Leim Fabry, MD;  Location: [facility];  Service: Orthopedics;  Laterality: Left;  . ORIF HUMERUS FRACTURE Left 11/23/2016   Procedure: OPEN REDUCTION INTERNAL FIXATION (ORIF) PROXIMAL HUMERUS FRACTURE;  Surgeon: Leim Fabry, MD;  Location: [facility];  Service: Orthopedics;  Laterality: Left;  . REVERSE SHOULDER ARTHROPLASTY Left 12/27/2016   Procedure: REVERSE SHOULDER ARTHROPLASTY;  Surgeon: Leim Fabry, MD;  Location: [facility];  Service: Orthopedics;  Laterality: Left;   Family History:  Family History  Problem Relation Age of Onset  . Anxiety disorder Brother   . Obesity Brother   . COPD Brother   . Kidney disease Mother   . Heart attack Father   . Hypertension Father   . Alcohol abuse Father   . Depression Brother   . Anxiety disorder Brother   .  Breast cancer Maternal Grandmother    Social History:  Social History   Socioeconomic History  . Marital status: Widowed    Spouse name: None  . Number of children: 2  . Years of education: None  . Highest education level: Bachelor's degree (e.g., BA, AB, BS)  Social Needs  . Financial resource strain: Hard  . Food insecurity - worry: Never true  . Food insecurity - inability: Never true  . Transportation needs - medical: No  .  Transportation needs - non-medical: No  Occupational History  . None  Tobacco Use  . Smoking status: Current Every Day Smoker    Packs/day: 0.25    Years: 5.00    Pack years: 1.25    Types: Cigarettes    Start date: 11/25/2009    Last attempt to quit: 03/09/2016    Years since quitting: 1.0  . Smokeless tobacco: Never Used  . Tobacco comment: Has tried patches and may go back on these again.  Says they work.   Substance and Sexual Activity  . Alcohol use: No    Alcohol/week: 0.0 oz    Comment: rarely  . Drug use: No  . Sexual activity: No  Other Topics Concern  . None  Social History Narrative  . None   Additional History:  Currently her son is living with her and patient is trying to help him move out into an apartment. 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  Assessment:   Musculoskeletal: Strength & Muscle Tone: within normal limits Gait & Station: normal Patient leans: N/A  Psychiatric Specialty Exam: Medication Refill     Review of Systems  Psychiatric/Behavioral: Positive for depression. The patient is nervous/anxious.     Blood pressure 106/72, pulse (!) 103, temperature (!) 97.3 F (36.3 C), temperature source Oral, weight 165 lb 9.6 oz (75.1 kg).Body mass index is 27.56 kg/m.  General Appearance: Casual  Eye Contact:  Good  Speech:  Clear and Coherent  Volume:  Normal  Mood:  Anxious, Depressed and Dysphoric  Affect:  Congruent  Thought Process:  Coherent  Orientation:  Full (Time, Place, and Person)  Thought Content:  WDL  Suicidal Thoughts:  No  Homicidal Thoughts:  No  Memory:  Immediate;   Fair  Judgement:  Fair  Insight:  Fair  Psychomotor Activity:  Decreased  Concentration:  Fair  Recall:  [employer] of Knowledge: Fair  Language: Fair  Akathisia:  No  Handed:  Right  AIMS (if indicated):    Assets:  Communication Skills Desire for Improvement Social Support  ADL's:  Intact  Cognition: WNL  Sleep: 6- 7 with  trazodone    Is the patient at risk to self?  No. Has the patient been a risk to self in the past 6 months?  No. Has the patient been a risk to self within the distant past?  No. Is the patient a risk to others?  No. Has the patient been a risk to others in the past 6 months?  No. Has the patient been a risk to others within the distant past?  No.  Current Medications: Current Outpatient Medications  Medication Sig Dispense Refill  . acetaminophen (TYLENOL) 500 MG tablet Take 500 mg every 6 (six) hours as needed by mouth for moderate pain or headache.    . alendronate (FOSAMAX) 70 MG tablet Take 1 tablet (70 mg total) by mouth once a week. (Patient taking differently: Take 70 mg every  Sunday by mouth. ) 12 tablet 3  . aspirin EC 325 MG EC tablet Take 1 tablet (325 mg total) by mouth daily. 30 tablet 0  . busPIRone (BUSPAR) 10 MG tablet Take 1 tablet (10 mg total) by mouth 2 (two) times daily. 180 tablet 1  . Calcium Carbonate-Vitamin D (CALCIUM + D PO) Take 1 tablet daily by mouth.     . Cholecalciferol (VITAMIN D) 2000 units tablet Take 2,000 Units by mouth daily.    . Multiple Vitamin (MULTIVITAMIN) tablet Take 1 tablet daily by mouth.     Marland Kitchen omeprazole (PRILOSEC) 20 MG capsule Take 1 capsule (20 mg total) by mouth 2 (two) times daily before a meal. 90 capsule 3  . PARoxetine (PAXIL) 20 MG tablet Take 1 tablet (20 mg total) by mouth daily before supper. 30 tablet 1  . simvastatin (ZOCOR) 20 MG tablet Take 1 tablet (20 mg total) by mouth at bedtime. bedtime 90 tablet 3  . traZODone (DESYREL) 100 MG tablet Take 1 tablet (100 mg total) by mouth at bedtime. 90 tablet 2   No current facility-administered medications for this visit.     Medical Decision Making:  Review of Psycho-Social Stressors (1) and Review and summation of old records (2)  Treatment Plan Summary:Medication management   Continue medications as follows    Paxil 20 mg by mouth daily at 3pm  Continue trazodone 100 mg at  bedtime. Continue BuSpar 10 mg by mouth twice a day D/c Hydroxyzine     More than 50% of the time spent in psychoeducation, counseling and coordination of care.   Follow-up in 1 months or earlier depending on her symptoms  This note was generated in part or whole with voice recognition software. Voice regonition is usually quite accurate but there are transcription errors that can and very often do occur. I apologize for any typographical errors that were not detected and corrected.    Rainey Pines, MD    03/14/2017, 11:58 AM

## 2017-04-11 ENCOUNTER — Other Ambulatory Visit: Payer: Self-pay

## 2017-04-11 ENCOUNTER — Ambulatory Visit: Payer: Federal, State, Local not specified - PPO | Admitting: Psychiatry

## 2017-04-11 ENCOUNTER — Encounter: Payer: Self-pay | Admitting: Psychiatry

## 2017-04-11 VITALS — BP 92/62 | HR 101 | Temp 97.7°F | Wt 161.6 lb

## 2017-04-11 DIAGNOSIS — F331 Major depressive disorder, recurrent, moderate: Secondary | ICD-10-CM | POA: Diagnosis not present

## 2017-04-11 MED ORDER — BUSPIRONE HCL 10 MG PO TABS: 10.0000 mg | ORAL_TABLET | Freq: Two times a day (BID) | ORAL | 1 refills | Status: DC

## 2017-04-11 MED ORDER — TRAZODONE HCL 100 MG PO TABS: 100.0000 mg | ORAL_TABLET | Freq: Every day | ORAL | 2 refills | Status: DC

## 2017-04-11 MED ORDER — PAROXETINE HCL 20 MG PO TABS: 20.0000 mg | ORAL_TABLET | Freq: Every day | ORAL | 1 refills | Status: DC

## 2017-04-11 NOTE — Progress Notes (Signed)
BH MD/PA/NP OP Progress Note  04/11/2017 12:06 PM Chelsey [name]  MRN:  [id number]  Subjective:  Patient is a 67 year old female who presented for the follow-up appointment. She is concerned about her shoulder.  Patient reported that she recently fell in her house dropped some food on the floor.  She reported that she did not hurt her shoulder has been sleeping well.  She denied having any suicidal or homicidal ideation or plans.  We discussed at length about her shoulder and her anxiety.  Patient appeared more alert during the interview.    She sleeps well at night.  She has been compliant with her medications.  She is receptive to her medication changes.   She reported that her son continues to drink on a regular basis and  was drunk over the weekend. She is concerned about his behavior but he is not interested in going to the rehabilitation program. She reported that she is also going to the rehabilitation for her shoulder surgery and has been doing some exercise at home.     .     Chief Complaint:  Chief Complaint    Follow-up; Medication Refill     Visit Diagnosis:     ICD-10-CM   1. MDD (major depressive disorder), recurrent episode, moderate ([place]) F33.1     Past Medical History:  Past Medical History:  Diagnosis Date  . Breast cancer ([place]) 1997   right breast, radiation  . Bronchitis    recent/ 06/09/15 [facility]/resolved  . Cancer [facility]) 1997   right lumpectomy,L/Ad/R   . GERD (gastroesophageal reflux disease)   . Hypercholesteremia   . Hyperlipidemia   . Panic attack   . Personal history of malignant neoplasm of breast     Past Surgical History:  Procedure Laterality Date  . BICEPT TENODESIS Left 11/23/2016   Procedure: BICEPS TENODESIS;  Surgeon: Leim Fabry, MD;  Location: [facility];  Service: Orthopedics;  Laterality: Left;  . BREAST EXCISIONAL BIOPSY Right 1997   pos  . BREAST SURGERY Right 1997   lumpectomy  . CHOLECYSTECTOMY  N/A 08/06/2016   Procedure: LAPAROSCOPIC CHOLECYSTECTOMY WITH INTRAOPERATIVE CHOLANGIOGRAM;  Surgeon: Christene Lye, MD;  Location: [facility];  Service: General;  Laterality: N/A;  . COLONOSCOPY  2008  . COLONOSCOPY WITH PROPOFOL N/A 07/21/2015   Procedure: COLONOSCOPY WITH PROPOFOL;  Surgeon: Lucilla Lame, MD;  Location: [place];  Service: Endoscopy;  Laterality: N/A;  . ESOPHAGOGASTRODUODENOSCOPY (EGD) WITH PROPOFOL N/A 06/16/2016   Procedure: ESOPHAGOGASTRODUODENOSCOPY (EGD) WITH PROPOFOL;  Surgeon: Christene Lye, MD;  Location: [facility];  Service: Endoscopy;  Laterality: N/A;  . HARDWARE REMOVAL Left 12/27/2016   Procedure: HARDWARE REMOVAL LEFT  PROXIMAL HUMEROUS;  Surgeon: Leim Fabry, MD;  Location: [facility];  Service: Orthopedics;  Laterality: Left;  . ORIF HUMERUS FRACTURE Left 11/23/2016   Procedure: OPEN REDUCTION INTERNAL FIXATION (ORIF) PROXIMAL HUMERUS FRACTURE;  Surgeon: Leim Fabry, MD;  Location: [facility];  Service: Orthopedics;  Laterality: Left;  . REVERSE SHOULDER ARTHROPLASTY Left 12/27/2016   Procedure: REVERSE SHOULDER ARTHROPLASTY;  Surgeon: Leim Fabry, MD;  Location: [facility];  Service: Orthopedics;  Laterality: Left;   Family History:  Family History  Problem Relation Age of Onset  . Anxiety disorder Brother   . Obesity Brother   . COPD Brother   . Kidney disease Mother   . Heart attack Father   . Hypertension Father   . Alcohol abuse Father   . Depression Brother   .  Anxiety disorder Brother   . Breast cancer Maternal Grandmother    Social History:  Social History   Socioeconomic History  . Marital status: Widowed    Spouse name: None  . Number of children: 2  . Years of education: None  . Highest education level: Bachelor's degree (e.g., BA, AB, BS)  Social Needs  . Financial resource strain: Hard  . Food insecurity - worry: Never true  . Food insecurity - inability: Never true  . Transportation needs - medical: No   . Transportation needs - non-medical: No  Occupational History  . None  Tobacco Use  . Smoking status: Current Every Day Smoker    Packs/day: 0.25    Years: 5.00    Pack years: 1.25    Types: Cigarettes    Start date: 11/25/2009    Last attempt to quit: 03/09/2016    Years since quitting: 1.0  . Smokeless tobacco: Never Used  . Tobacco comment: Has tried patches and may go back on these again.  Says they work.   Substance and Sexual Activity  . Alcohol use: No    Alcohol/week: 0.0 oz    Comment: rarely  . Drug use: No  . Sexual activity: No  Other Topics Concern  . None  Social History Narrative  . None   Additional History:  Currently her son is living with her and patient is trying to help him move out into an apartment. 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  Assessment:   Musculoskeletal: Strength & Muscle Tone: within normal limits Gait & Station: normal Patient leans: N/A  Psychiatric Specialty Exam: Medication Refill     Review of Systems  Psychiatric/Behavioral: Positive for depression. The patient is nervous/anxious.     Blood pressure 92/62, pulse (!) 101, temperature 97.7 F (36.5 C), temperature source Oral, weight 161 lb 9.6 oz (73.3 kg).Body mass index is 26.89 kg/m.  General Appearance: Casual  Eye Contact:  Good  Speech:  Clear and Coherent  Volume:  Normal  Mood:  Anxious, Depressed and Dysphoric  Affect:  Congruent  Thought Process:  Coherent  Orientation:  Full (Time, Place, and Person)  Thought Content:  WDL  Suicidal Thoughts:  No  Homicidal Thoughts:  No  Memory:  Immediate;   Fair  Judgement:  Fair  Insight:  Fair  Psychomotor Activity:  Decreased  Concentration:  Fair  Recall:  [employer] of Knowledge: Fair  Language: Fair  Akathisia:  No  Handed:  Right  AIMS (if indicated):    Assets:  Communication Skills Desire for Improvement Social Support  ADL's:  Intact  Cognition: WNL  Sleep: 6- 7 with  trazodone    Is the patient at risk to self?  No. Has the patient been a risk to self in the past 6 months?  No. Has the patient been a risk to self within the distant past?  No. Is the patient a risk to others?  No. Has the patient been a risk to others in the past 6 months?  No. Has the patient been a risk to others within the distant past?  No.  Current Medications: Current Outpatient Medications  Medication Sig Dispense Refill  . acetaminophen (TYLENOL) 500 MG tablet Take 500 mg every 6 (six) hours as needed by mouth for moderate pain or headache.    . alendronate (FOSAMAX) 70 MG tablet Take 1 tablet (70 mg total) by mouth once a week. (Patient taking  differently: Take 70 mg every Sunday by mouth. ) 12 tablet 3  . aspirin EC 325 MG EC tablet Take 1 tablet (325 mg total) by mouth daily. 30 tablet 0  . busPIRone (BUSPAR) 10 MG tablet Take 1 tablet (10 mg total) by mouth 2 (two) times daily. 180 tablet 1  . Calcium Carbonate-Vitamin D (CALCIUM + D PO) Take 1 tablet daily by mouth.     . Cholecalciferol (VITAMIN D) 2000 units tablet Take 2,000 Units by mouth daily.    . Multiple Vitamin (MULTIVITAMIN) tablet Take 1 tablet daily by mouth.     Marland Kitchen omeprazole (PRILOSEC) 20 MG capsule Take 1 capsule (20 mg total) by mouth 2 (two) times daily before a meal. 90 capsule 3  . PARoxetine (PAXIL) 20 MG tablet Take 1 tablet (20 mg total) by mouth daily before supper. 30 tablet 1  . simvastatin (ZOCOR) 20 MG tablet Take 1 tablet (20 mg total) by mouth at bedtime. bedtime 90 tablet 3  . traZODone (DESYREL) 100 MG tablet Take 1 tablet (100 mg total) by mouth at bedtime. 90 tablet 2   No current facility-administered medications for this visit.     Medical Decision Making:  Review of Psycho-Social Stressors (1) and Review and summation of old records (2)  Treatment Plan Summary:Medication management   Continue medications as follows    Paxil 20 mg by mouth daily at 3pm  Continue trazodone 100 mg at  bedtime. Continue BuSpar 10 mg by mouth twice a day      More than 50% of the time spent in psychoeducation, counseling and coordination of care.   Follow-up in 1 months or earlier depending on her symptoms  This note was generated in part or whole with voice recognition software. Voice regonition is usually quite accurate but there are transcription errors that can and very often do occur. I apologize for any typographical errors that were not detected and corrected.    Rainey Pines, MD    04/11/2017, 12:06 PM

## 2017-05-09 ENCOUNTER — Encounter: Payer: Self-pay | Admitting: Unknown Physician Specialty

## 2017-05-09 ENCOUNTER — Ambulatory Visit: Payer: Federal, State, Local not specified - PPO | Admitting: Unknown Physician Specialty

## 2017-05-09 VITALS — BP 102/72 | HR 92 | Temp 98.4°F | Wt 156.3 lb

## 2017-05-09 DIAGNOSIS — L308 Other specified dermatitis: Secondary | ICD-10-CM

## 2017-05-09 DIAGNOSIS — L819 Disorder of pigmentation, unspecified: Secondary | ICD-10-CM | POA: Diagnosis not present

## 2017-05-09 MED ORDER — BETAMETHASONE DIPROPIONATE AUG 0.05 % EX CREA: TOPICAL_CREAM | Freq: Two times a day (BID) | CUTANEOUS | 0 refills | Status: DC

## 2017-05-09 NOTE — Progress Notes (Signed)
BP 102/72   Pulse 92   Temp 98.4 F (36.9 C) (Oral)   Wt 156 lb 4.8 oz (70.9 kg)   LMP  (LMP Unknown)   SpO2 97%   BMI 26.01 kg/m    Subjective:    Patient ID: Chelsey [name], female    DOB: 1950-08-01, 67 y.o.   MRN: [id number]  HPI: Chelsey [name] is a 67 y.o. female  Chief Complaint  Patient presents with  . Rash    pt states she has a rash all over her body that itches sometimes, states she has had it for a while and it has just gotten worse   Pt is here for a rash.  She is concerned that due to stress, she has eczema all over her body.  States she has areas on her hands where she has scratched and a couple of areas on her upper arms.  Today, she is primarily concerned with areas of hypopigmentation she has suddenly noticed.  Other than the areas on her hands, she does not itch.    She is s/p shoulder replacement from a work injury.  She still has limited ROM on her left arm and is having trouble getting the necessary therapy.  States she feels nervous all the time.  Getting therapy and sees psychiatry.  She is sleeping at night  Relevant past medical, surgical, family and social history reviewed and updated as indicated. Interim medical history since our last visit reviewed. Allergies and medications reviewed and updated.  Review of Systems  Per HPI unless specifically indicated above     Objective:    BP 102/72   Pulse 92   Temp 98.4 F (36.9 C) (Oral)   Wt 156 lb 4.8 oz (70.9 kg)   LMP  (LMP Unknown)   SpO2 97%   BMI 26.01 kg/m   Wt Readings from Last 3 Encounters:  05/09/17 156 lb 4.8 oz (70.9 kg)  02/17/17 165 lb (74.8 kg)  01/31/17 171 lb 4.8 oz (77.7 kg)    Physical Exam  Constitutional: She is oriented to person, place, and time. She appears well-developed and well-nourished. No distress.  HENT:  Head: Normocephalic and atraumatic.  Eyes: Conjunctivae and lids are normal. Right eye exhibits no discharge. Left eye exhibits no discharge. No  scleral icterus.  Neck: Normal range of motion. Neck supple. No JVD present. Carotid bruit is not present.  Cardiovascular: Normal rate, regular rhythm and normal heart sounds.  Pulmonary/Chest: Effort normal and breath sounds normal.  Abdominal: Normal appearance. There is no splenomegaly or hepatomegaly.  Musculoskeletal: Normal range of motion.  Neurological: She is alert and oriented to person, place, and time.  Skin: Skin is warm, dry and intact. No rash noted. No pallor.  Areas of Ecchymosis bilateral medial wrists and hands. Small area right upper shoulder  Psychiatric: She has a normal mood and affect. Her behavior is normal. Judgment and thought content normal.    Results for orders placed or performed during the hospital encounter of 02/17/17  CBC  Result Value Ref Range   WBC 5.5 3.6 - 11.0 K/uL   RBC 4.28 3.80 - 5.20 MIL/uL   Hemoglobin 11.4 (L) 12.0 - 16.0 g/dL   HCT 35.1 35.0 - 47.0 %   MCV 82.0 80.0 - 100.0 fL   MCH 26.6 26.0 - 34.0 pg   MCHC 32.5 32.0 - 36.0 g/dL   RDW 16.6 (H) 11.5 - 14.5 %   Platelets 321 150 - 440  K/uL  Basic metabolic panel  Result Value Ref Range   Sodium 140 135 - 145 mmol/L   Potassium 3.8 3.5 - 5.1 mmol/L   Chloride 106 101 - 111 mmol/L   CO2 28 22 - 32 mmol/L   Glucose, Bld 116 (H) 65 - 99 mg/dL   BUN 10 6 - 20 mg/dL   Creatinine, Ser 0.70 0.44 - 1.00 mg/dL   Calcium 9.2 8.9 - 10.3 mg/dL   GFR calc non Af Amer >60 >60 mL/min   GFR calc Af Amer >60 >60 mL/min   Anion gap 6 5 - 15  TSH  Result Value Ref Range   TSH 2.217 0.350 - 4.500 uIU/mL      Assessment & Plan:   Problem List Items Addressed This Visit    None    Visit Diagnoses    Other eczema    -  Primary   Probably related to stress.  Will rx Diprolene cream.     Hypopigmentation       Scattered areas of hyopigmentation that are not photosensitive.  Typical for age.  Reassurance given       Follow up plan: Return if symptoms worsen or fail to improve.

## 2017-05-18 DIAGNOSIS — E663 Overweight: Secondary | ICD-10-CM | POA: Insufficient documentation

## 2017-06-06 ENCOUNTER — Encounter: Payer: Self-pay | Admitting: Psychiatry

## 2017-06-06 ENCOUNTER — Other Ambulatory Visit: Payer: Self-pay

## 2017-06-06 ENCOUNTER — Ambulatory Visit: Payer: Federal, State, Local not specified - PPO | Admitting: Psychiatry

## 2017-06-06 VITALS — BP 94/69 | HR 96 | Temp 97.5°F | Wt 153.0 lb

## 2017-06-06 DIAGNOSIS — F331 Major depressive disorder, recurrent, moderate: Secondary | ICD-10-CM

## 2017-06-06 MED ORDER — TRAZODONE HCL 100 MG PO TABS: 100.0000 mg | ORAL_TABLET | Freq: Every day | ORAL | 2 refills | Status: DC

## 2017-06-06 MED ORDER — BUSPIRONE HCL 10 MG PO TABS: 10.0000 mg | ORAL_TABLET | Freq: Two times a day (BID) | ORAL | 1 refills | Status: DC

## 2017-06-06 MED ORDER — PAROXETINE HCL 20 MG PO TABS: 20.0000 mg | ORAL_TABLET | Freq: Every day | ORAL | 1 refills | Status: DC

## 2017-06-06 NOTE — Progress Notes (Signed)
BH MD/PA/NP OP Progress Note  06/06/2017 11:58 AM Chelsey [name]  MRN:  [id number]  Subjective:  Patient is a 67 year old female who presented for the follow-up appointment. She is concerned about her shoulder.  Patient reported that she recently seen by the surgeon and he has referred him to the pain clinic at [facility].  She reported that she continues to have severe pain and is unable to do any activities at home.  She has been living with her son who has been helping her.  Patient reported that she was following with the physical therapy but now it has been suspended awaiting her next appointment.  Patient reported that she feels that her shoulder is frozen and she cannot move it in certain directions.  She was also discussing about the bills she has received she went to the post office but they are not helping her.  She is not working at this time.  Patient reported that she has been compliant with her medications and does not have any side effects.  She appeared to have lost some weight as she reported that she does not eat well during the daytime.  She denied having any suicidal or homicidal ideations or plans.   Discussed with her medications and she agreed with the plan.        .     Chief Complaint:  Chief Complaint    Follow-up; Medication Refill     Visit Diagnosis:     ICD-10-CM   1. MDD (major depressive disorder), recurrent episode, moderate ([place]) F33.1     Past Medical History:  Past Medical History:  Diagnosis Date  . Breast cancer ([place]) 1997   right breast, radiation  . Bronchitis    recent/ 06/09/15 [facility]/resolved  . Cancer [facility]) 1997   right lumpectomy,L/Ad/R   . GERD (gastroesophageal reflux disease)   . Hypercholesteremia   . Hyperlipidemia   . Panic attack   . Personal history of malignant neoplasm of breast     Past Surgical History:  Procedure Laterality Date  . BICEPT TENODESIS Left 11/23/2016   Procedure: BICEPS TENODESIS;   Surgeon: Leim Fabry, MD;  Location: [facility];  Service: Orthopedics;  Laterality: Left;  . BREAST EXCISIONAL BIOPSY Right 1997   pos  . BREAST SURGERY Right 1997   lumpectomy  . CHOLECYSTECTOMY N/A 08/06/2016   Procedure: LAPAROSCOPIC CHOLECYSTECTOMY WITH INTRAOPERATIVE CHOLANGIOGRAM;  Surgeon: Christene Lye, MD;  Location: [facility];  Service: General;  Laterality: N/A;  . COLONOSCOPY  2008  . COLONOSCOPY WITH PROPOFOL N/A 07/21/2015   Procedure: COLONOSCOPY WITH PROPOFOL;  Surgeon: Lucilla Lame, MD;  Location: [place];  Service: Endoscopy;  Laterality: N/A;  . ESOPHAGOGASTRODUODENOSCOPY (EGD) WITH PROPOFOL N/A 06/16/2016   Procedure: ESOPHAGOGASTRODUODENOSCOPY (EGD) WITH PROPOFOL;  Surgeon: Christene Lye, MD;  Location: [facility];  Service: Endoscopy;  Laterality: N/A;  . HARDWARE REMOVAL Left 12/27/2016   Procedure: HARDWARE REMOVAL LEFT  PROXIMAL HUMEROUS;  Surgeon: Leim Fabry, MD;  Location: [facility];  Service: Orthopedics;  Laterality: Left;  . ORIF HUMERUS FRACTURE Left 11/23/2016   Procedure: OPEN REDUCTION INTERNAL FIXATION (ORIF) PROXIMAL HUMERUS FRACTURE;  Surgeon: Leim Fabry, MD;  Location: [facility];  Service: Orthopedics;  Laterality: Left;  . REVERSE SHOULDER ARTHROPLASTY Left 12/27/2016   Procedure: REVERSE SHOULDER ARTHROPLASTY;  Surgeon: Leim Fabry, MD;  Location: [facility];  Service: Orthopedics;  Laterality: Left;   Family History:  Family History  Problem Relation Age of Onset  .  Anxiety disorder Brother   . Obesity Brother   . COPD Brother   . Kidney disease Mother   . Heart attack Father   . Hypertension Father   . Alcohol abuse Father   . Depression Brother   . Anxiety disorder Brother   . Breast cancer Maternal Grandmother    Social History:  Social History   Socioeconomic History  . Marital status: Widowed    Spouse name: Not on file  . Number of children: 2  . Years of education: Not on file  . Highest education  level: Bachelor's degree (e.g., BA, AB, BS)  Occupational History  . Not on file  Social Needs  . Financial resource strain: Hard  . Food insecurity:    Worry: Never true    Inability: Never true  . Transportation needs:    Medical: No    Non-medical: No  Tobacco Use  . Smoking status: Current Every Day Smoker    Packs/day: 0.25    Years: 5.00    Pack years: 1.25    Types: Cigarettes    Start date: 11/25/2009    Last attempt to quit: 03/09/2016    Years since quitting: 1.2  . Smokeless tobacco: Never Used  . Tobacco comment: Has tried patches and may go back on these again.  Says they work.   Substance and Sexual Activity  . Alcohol use: No    Alcohol/week: 0.0 oz    Comment: rarely  . Drug use: No  . Sexual activity: Never  Lifestyle  . Physical activity:    Days per week: 0 days    Minutes per session: 0 min  . Stress: Very much  Relationships  . Social connections:    Talks on phone: Not on file    Gets together: Not on file    Attends religious service: Never    Active member of club or organization: No    Attends meetings of clubs or organizations: Never    Relationship status: Widowed  Other Topics Concern  . Not on file  Social History Narrative  . Not on file   Additional History:  Currently her son is living with her and patient is trying to help him move out into an apartment. 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  Assessment:   Musculoskeletal: Strength & Muscle Tone: within normal limits Gait & Station: normal Patient leans: N/A  Psychiatric Specialty Exam: Medication Refill     Review of Systems  Psychiatric/Behavioral: Positive for depression. The patient is nervous/anxious.     Blood pressure 94/69, pulse 96, temperature (!) 97.5 F (36.4 C), temperature source Oral, weight 153 lb (69.4 kg).Body mass index is 25.46 kg/m.  General Appearance: Casual  Eye Contact:  Good  Speech:  Clear and Coherent  Volume:  Normal   Mood:  Anxious, Depressed and Dysphoric  Affect:  Congruent  Thought Process:  Coherent  Orientation:  Full (Time, Place, and Person)  Thought Content:  WDL  Suicidal Thoughts:  No  Homicidal Thoughts:  No  Memory:  Immediate;   Fair  Judgement:  Fair  Insight:  Fair  Psychomotor Activity:  Decreased  Concentration:  Fair  Recall:  [employer] of Knowledge: Fair  Language: Fair  Akathisia:  No  Handed:  Right  AIMS (if indicated):    Assets:  Communication Skills Desire for Improvement Social Support  ADL's:  Intact  Cognition: WNL  Sleep: 6- 7 with trazodone  Is the patient at risk to self?  No. Has the patient been a risk to self in the past 6 months?  No. Has the patient been a risk to self within the distant past?  No. Is the patient a risk to others?  No. Has the patient been a risk to others in the past 6 months?  No. Has the patient been a risk to others within the distant past?  No.  Current Medications: Current Outpatient Medications  Medication Sig Dispense Refill  . acetaminophen (TYLENOL) 500 MG tablet Take 500 mg every 6 (six) hours as needed by mouth for moderate pain or headache.    . alendronate (FOSAMAX) 70 MG tablet Take 1 tablet (70 mg total) by mouth once a week. (Patient taking differently: Take 70 mg every Sunday by mouth. ) 12 tablet 3  . augmented betamethasone dipropionate (DIPROLENE AF) 0.05 % cream Apply topically 2 (two) times daily. 30 g 0  . busPIRone (BUSPAR) 10 MG tablet Take 1 tablet (10 mg total) by mouth 2 (two) times daily. 180 tablet 1  . Calcium Carbonate-Vitamin D (CALCIUM + D PO) Take 1 tablet daily by mouth.     . Cholecalciferol (VITAMIN D) 2000 units tablet Take 2,000 Units by mouth daily.    . Ferrous Gluconate (IRON 27 PO) Take 27 mg by mouth daily.    . Multiple Vitamin (MULTIVITAMIN) tablet Take 1 tablet daily by mouth.     Marland Kitchen omeprazole (PRILOSEC) 20 MG capsule Take 1 capsule (20 mg total) by mouth 2 (two) times daily before  a meal. 90 capsule 3  . PARoxetine (PAXIL) 20 MG tablet Take 1 tablet (20 mg total) by mouth daily before supper. 90 tablet 1  . simvastatin (ZOCOR) 20 MG tablet Take 1 tablet (20 mg total) by mouth at bedtime. bedtime 90 tablet 3  . traZODone (DESYREL) 100 MG tablet Take 1 tablet (100 mg total) by mouth at bedtime. 90 tablet 2   No current facility-administered medications for this visit.     Medical Decision Making:  Review of Psycho-Social Stressors (1) and Review and summation of old records (2)  Treatment Plan Summary:Medication management   Continue medications as follows    Paxil 20 mg by mouth daily at 3pm  Continue trazodone 100 mg at bedtime. Continue BuSpar 10 mg by mouth twice a day      More than 50% of the time spent in psychoeducation, counseling and coordination of care.   Follow-up in 1 months or earlier depending on her symptoms  This note was generated in part or whole with voice recognition software. Voice regonition is usually quite accurate but there are transcription errors that can and very often do occur. I apologize for any typographical errors that were not detected and corrected.    Rainey Pines, MD    06/06/2017, 11:58 AM

## 2017-06-07 ENCOUNTER — Other Ambulatory Visit: Payer: Self-pay | Admitting: Unknown Physician Specialty

## 2017-06-24 DIAGNOSIS — T8484XA Pain due to internal orthopedic prosthetic devices, implants and grafts, initial encounter: Secondary | ICD-10-CM | POA: Insufficient documentation

## 2017-07-27 ENCOUNTER — Encounter: Payer: Self-pay | Admitting: Unknown Physician Specialty

## 2017-07-27 ENCOUNTER — Other Ambulatory Visit: Payer: Self-pay

## 2017-07-27 ENCOUNTER — Ambulatory Visit (INDEPENDENT_AMBULATORY_CARE_PROVIDER_SITE_OTHER): Payer: Federal, State, Local not specified - PPO | Admitting: Unknown Physician Specialty

## 2017-07-27 VITALS — BP 94/64 | HR 83 | Temp 98.5°F | Ht 63.78 in | Wt 147.4 lb

## 2017-07-27 DIAGNOSIS — R109 Unspecified abdominal pain: Secondary | ICD-10-CM | POA: Diagnosis not present

## 2017-07-27 DIAGNOSIS — M810 Age-related osteoporosis without current pathological fracture: Secondary | ICD-10-CM

## 2017-07-27 DIAGNOSIS — Z720 Tobacco use: Secondary | ICD-10-CM | POA: Diagnosis not present

## 2017-07-27 DIAGNOSIS — Z9889 Other specified postprocedural states: Secondary | ICD-10-CM

## 2017-07-27 DIAGNOSIS — M8580 Other specified disorders of bone density and structure, unspecified site: Secondary | ICD-10-CM | POA: Insufficient documentation

## 2017-07-27 DIAGNOSIS — Z8639 Personal history of other endocrine, nutritional and metabolic disease: Secondary | ICD-10-CM

## 2017-07-27 DIAGNOSIS — F331 Major depressive disorder, recurrent, moderate: Secondary | ICD-10-CM

## 2017-07-27 DIAGNOSIS — Z967 Presence of other bone and tendon implants: Secondary | ICD-10-CM | POA: Diagnosis not present

## 2017-07-27 DIAGNOSIS — K219 Gastro-esophageal reflux disease without esophagitis: Secondary | ICD-10-CM

## 2017-07-27 DIAGNOSIS — Z23 Encounter for immunization: Secondary | ICD-10-CM

## 2017-07-27 DIAGNOSIS — Z8781 Personal history of (healed) traumatic fracture: Secondary | ICD-10-CM

## 2017-07-27 DIAGNOSIS — F17210 Nicotine dependence, cigarettes, uncomplicated: Secondary | ICD-10-CM | POA: Insufficient documentation

## 2017-07-27 DIAGNOSIS — Z0000 Encounter for general adult medical examination without abnormal findings: Secondary | ICD-10-CM

## 2017-07-27 DIAGNOSIS — Z0001 Encounter for general adult medical examination with abnormal findings: Secondary | ICD-10-CM | POA: Diagnosis not present

## 2017-07-27 MED ORDER — OMEPRAZOLE 20 MG PO CPDR: 20.0000 mg | DELAYED_RELEASE_CAPSULE | Freq: Two times a day (BID) | ORAL | 3 refills | Status: DC

## 2017-07-27 NOTE — Assessment & Plan Note (Signed)
Check lipids and adjust Simvastatin accordingly

## 2017-07-27 NOTE — Patient Instructions (Addendum)
Tdap Vaccine (Tetanus, Diphtheria and Pertussis): What You Need to Know 1. Why get vaccinated? Tetanus, diphtheria and pertussis are very serious diseases. Tdap vaccine can protect us from these diseases. And, Tdap vaccine given to pregnant women can protect newborn babies against pertussis. TETANUS (Lockjaw) is rare in the [place] today. It causes painful muscle tightening and stiffness, usually all over the body.  It can lead to tightening of muscles in the head and neck so you can't open your mouth, swallow, or sometimes even breathe. Tetanus kills about 1 out of 10 people who are infected even after receiving the best medical care.  DIPHTHERIA is also rare in the [place] today. It can cause a thick coating to form in the back of the throat.  It can lead to breathing problems, heart failure, paralysis, and death.  PERTUSSIS (Whooping Cough) causes severe coughing spells, which can cause difficulty breathing, vomiting and disturbed sleep.  It can also lead to weight loss, incontinence, and rib fractures. Up to 2 in 100 adolescents and 5 in 100 adults with pertussis are hospitalized or have complications, which could include pneumonia or death.  These diseases are caused by bacteria. Diphtheria and pertussis are spread from person to person through secretions from coughing or sneezing. Tetanus enters the body through cuts, scratches, or wounds. Before vaccines, as many as 200,000 cases of diphtheria, 200,000 cases of pertussis, and hundreds of cases of tetanus, were reported in the [place] each year. Since vaccination began, reports of cases for tetanus and diphtheria have dropped by about 99% and for pertussis by about 80%. 2. Tdap vaccine Tdap vaccine can protect adolescents and adults from tetanus, diphtheria, and pertussis. One dose of Tdap is routinely given at age 11 or 12. People who did not get Tdap at that age should get it as soon as possible. Tdap is especially  important for healthcare professionals and anyone having close contact with a baby younger than 12 months. Pregnant women should get a dose of Tdap during every pregnancy, to protect the newborn from pertussis. Infants are most at risk for severe, life-threatening complications from pertussis. Another vaccine, called Td, protects against tetanus and diphtheria, but not pertussis. A Td booster should be given every 10 years. Tdap may be given as one of these boosters if you have never gotten Tdap before. Tdap may also be given after a severe cut or burn to prevent tetanus infection. Your doctor or the person giving you the vaccine can give you more information. Tdap may safely be given at the same time as other vaccines. 3. Some people should not get this vaccine  A person who has ever had a life-threatening allergic reaction after a previous dose of any diphtheria, tetanus or pertussis containing vaccine, OR has a severe allergy to any part of this vaccine, should not get Tdap vaccine. Tell the person giving the vaccine about any severe allergies.  Anyone who had coma or long repeated seizures within 7 days after a childhood dose of DTP or DTaP, or a previous dose of Tdap, should not get Tdap, unless a cause other than the vaccine was found. They can still get Td.  Talk to your doctor if you: ? have seizures or another nervous system problem, ? had severe pain or swelling after any vaccine containing diphtheria, tetanus or pertussis, ? ever had a condition called Guillain-Barr Syndrome (GBS), ? aren't feeling well on the day the shot is scheduled. 4. Risks With any medicine, including   vaccines, there is a chance of side effects. These are usually mild and go away on their own. Serious reactions are also possible but are rare. Most people who get Tdap vaccine do not have any problems with it. Mild problems following Tdap: (Did not interfere with activities)  Pain where the shot was given (about  3 in 4 adolescents or 2 in 3 adults)  Redness or swelling where the shot was given (about 1 person in 5)  Mild fever of at least 100.4F (up to about 1 in 25 adolescents or 1 in 100 adults)  Headache (about 3 or 4 people in 10)  Tiredness (about 1 person in 3 or 4)  Nausea, vomiting, diarrhea, stomach ache (up to 1 in 4 adolescents or 1 in 10 adults)  Chills, sore joints (about 1 person in 10)  Body aches (about 1 person in 3 or 4)  Rash, swollen glands (uncommon)  Moderate problems following Tdap: (Interfered with activities, but did not require medical attention)  Pain where the shot was given (up to 1 in 5 or 6)  Redness or swelling where the shot was given (up to about 1 in 16 adolescents or 1 in 12 adults)  Fever over 102F (about 1 in 100 adolescents or 1 in 250 adults)  Headache (about 1 in 7 adolescents or 1 in 10 adults)  Nausea, vomiting, diarrhea, stomach ache (up to 1 or 3 people in 100)  Swelling of the entire arm where the shot was given (up to about 1 in 500).  Severe problems following Tdap: (Unable to perform usual activities; required medical attention)  Swelling, severe pain, bleeding and redness in the arm where the shot was given (rare).  Problems that could happen after any vaccine:  People sometimes faint after a medical procedure, including vaccination. Sitting or lying down for about 15 minutes can help prevent fainting, and injuries caused by a fall. Tell your doctor if you feel dizzy, or have vision changes or ringing in the ears.  Some people get severe pain in the shoulder and have difficulty moving the arm where a shot was given. This happens very rarely.  Any medication can cause a severe allergic reaction. Such reactions from a vaccine are very rare, estimated at fewer than 1 in a million doses, and would happen within a few minutes to a few hours after the vaccination. As with any medicine, there is a very remote chance of a vaccine  causing a serious injury or death. The safety of vaccines is always being monitored. For more information, visit: www.cdc.gov/vaccinesafety/ 5. What if there is a serious problem? What should I look for? Look for anything that concerns you, such as signs of a severe allergic reaction, very high fever, or unusual behavior. Signs of a severe allergic reaction can include hives, swelling of the face and throat, difficulty breathing, a fast heartbeat, dizziness, and weakness. These would usually start a few minutes to a few hours after the vaccination. What should I do?  If you think it is a severe allergic reaction or other emergency that can't wait, call 9-1-1 or get the person to the nearest hospital. Otherwise, call your doctor.  Afterward, the reaction should be reported to the Vaccine Adverse Event Reporting System (VAERS). Your doctor might file this report, or you can do it yourself through the VAERS web site at www.vaers.hhs.gov, or by calling 1-800-822-7967. ? VAERS does not give medical advice. 6. The National Vaccine Injury Compensation Program The National   Vaccine Injury Compensation Program (VICP) is a federal program that was created to compensate people who may have been injured by certain vaccines. Persons who believe they may have been injured by a vaccine can learn about the program and about filing a claim by calling 2101207997 or visiting the [facility] website at GoldCloset.com.ee. There is a time limit to file a claim for compensation. 7. How can I learn more?  Ask your doctor. He or she can give you the vaccine package insert or suggest other sources of information.  Call your local or state health department.  Contact the Centers for Disease Control and Prevention (CDC): ? Call 518-548-7398 (1-800-CDC-INFO) or ? Visit CDC's website at http://hunter.com/ CDC Tdap Vaccine VIS (03/20/13) This information is not intended to replace advice given to you by your  health care provider. Make sure you discuss any questions you have with your health care provider.  Please do call to schedule your bone density; the number to schedule one at either [facility] or [facility] is [phone]

## 2017-07-27 NOTE — Assessment & Plan Note (Signed)
Complications.  Reviewed notes.  Second opinion is ongoing.  No decisions made

## 2017-07-27 NOTE — Progress Notes (Signed)
BP 94/64   Pulse 83   Temp 98.5 F (36.9 C) (Oral)   Ht 5' 3.78" (1.62 m)   Wt 147 lb 6.4 oz (66.9 kg)   LMP  (LMP Unknown)   SpO2 95%   BMI 25.48 kg/m    Subjective:    Patient ID: Chelsey [name], female    DOB: 08-03-1950, 67 y.o.   MRN: [id number]  HPI: Chelsey [name] is a 67 y.o. female  Chief Complaint  Patient presents with  . Annual Exam   Functional Status Survey: Is the patient deaf or have difficulty hearing?: No Does the patient have difficulty seeing, even when wearing glasses/contacts?: No Does the patient have difficulty concentrating, remembering, or making decisions?: No Does the patient have difficulty walking or climbing stairs?: No Does the patient have difficulty dressing or bathing?: No Does the patient have difficulty doing errands alone such as visiting a doctor's office or shopping?: No  Left shoulder replacement Pt is having trouble with left shoulder pain following shoulder replacement after a humeral fracture.  Unable to work under current conditions.  Went to [facility] for a second opinion and for a culture and sensitivity.  She is pretty frustrated at this point.  Aspirate showed no infection.  Sounds like a CT is pending  Depression Meds through psychiatry.   Depression screen [facility] 2/9 07/27/2017 08/17/2016 07/05/2016  Decreased Interest 1 0 0  Down, Depressed, Hopeless 1 1 0  PHQ - 2 Score 2 1 0  Altered sleeping 0 1 0  Tired, decreased energy 1 1 0  Change in appetite 1 0 0  Feeling bad or failure about yourself  1 0 0  Trouble concentrating 0 1 0  Moving slowly or fidgety/restless 0 0 0  Suicidal thoughts 1 0 0  PHQ-9 Score 6 4 0   Hyperlipidemia Using medications without problems: No Muscle aches  Diet compliance:Exercise: Told to stop shoulder exercises.    Social History   Socioeconomic History  . Marital status: Widowed    Spouse name: Not on file  . Number of children: 2  . Years of education: Not on file  . Highest  education level: Bachelor's degree (e.g., BA, AB, BS)  Occupational History  . Not on file  Social Needs  . Financial resource strain: Hard  . Food insecurity:    Worry: Never true    Inability: Never true  . Transportation needs:    Medical: No    Non-medical: No  Tobacco Use  . Smoking status: Current Every Day Smoker    Packs/day: 0.25    Years: 5.00    Pack years: 1.25    Types: Cigarettes    Start date: 11/25/2009    Last attempt to quit: 03/09/2016    Years since quitting: 1.3  . Smokeless tobacco: Never Used  . Tobacco comment: Has tried patches and may go back on these again.  Says they work.   Substance and Sexual Activity  . Alcohol use: No    Alcohol/week: 0.0 oz    Comment: rarely  . Drug use: No  . Sexual activity: Never  Lifestyle  . Physical activity:    Days per week: 0 days    Minutes per session: 0 min  . Stress: Very much  Relationships  . Social connections:    Talks on phone: Not on file    Gets together: Not on file    Attends religious service: Never    Active member of  club or organization: No    Attends meetings of clubs or organizations: Never    Relationship status: Widowed  . Intimate partner violence:    Fear of current or ex partner: No    Emotionally abused: No    Physically abused: No    Forced sexual activity: No  Other Topics Concern  . Not on file  Social History Narrative  . Not on file   Family History  Problem Relation Age of Onset  . Anxiety disorder Brother   . Obesity Brother   . COPD Brother   . Kidney disease Mother   . Heart attack Father   . Hypertension Father   . Alcohol abuse Father   . Depression Brother   . Anxiety disorder Brother   . Breast cancer Maternal Grandmother    Past Medical History:  Diagnosis Date  . Breast cancer ([place]) 1997   right breast, radiation  . Bronchitis    recent/ 06/09/15 had chest xray/ [facility]/resolved  . Cancer [facility]) 1997   right lumpectomy,L/Ad/R   . GERD  (gastroesophageal reflux disease)   . Hypercholesteremia   . Hyperlipidemia   . Panic attack   . Personal history of malignant neoplasm of breast    Past Surgical History:  Procedure Laterality Date  . BICEPT TENODESIS Left 11/23/2016   Procedure: BICEPS TENODESIS;  Surgeon: Leim Fabry, MD;  Location: [facility];  Service: Orthopedics;  Laterality: Left;  . BREAST EXCISIONAL BIOPSY Right 1997   pos  . BREAST SURGERY Right 1997   lumpectomy  . CHOLECYSTECTOMY N/A 08/06/2016   Procedure: LAPAROSCOPIC CHOLECYSTECTOMY WITH INTRAOPERATIVE CHOLANGIOGRAM;  Surgeon: Christene Lye, MD;  Location: [facility];  Service: General;  Laterality: N/A;  . COLONOSCOPY  2008  . COLONOSCOPY WITH PROPOFOL N/A 07/21/2015   Procedure: COLONOSCOPY WITH PROPOFOL;  Surgeon: Lucilla Lame, MD;  Location: [facility];  Service: Endoscopy;  Laterality: N/A;  . ESOPHAGOGASTRODUODENOSCOPY (EGD) WITH PROPOFOL N/A 06/16/2016   Procedure: ESOPHAGOGASTRODUODENOSCOPY (EGD) WITH PROPOFOL;  Surgeon: Christene Lye, MD;  Location: [facility];  Service: Endoscopy;  Laterality: N/A;  . HARDWARE REMOVAL Left 12/27/2016   Procedure: HARDWARE REMOVAL LEFT  PROXIMAL HUMEROUS;  Surgeon: Leim Fabry, MD;  Location: [facility];  Service: Orthopedics;  Laterality: Left;  . ORIF HUMERUS FRACTURE Left 11/23/2016   Procedure: OPEN REDUCTION INTERNAL FIXATION (ORIF) PROXIMAL HUMERUS FRACTURE;  Surgeon: Leim Fabry, MD;  Location: [facility];  Service: Orthopedics;  Laterality: Left;  . REVERSE SHOULDER ARTHROPLASTY Left 12/27/2016   Procedure: REVERSE SHOULDER ARTHROPLASTY;  Surgeon: Leim Fabry, MD;  Location: [facility];  Service: Orthopedics;  Laterality: Left;    Relevant past medical, surgical, family and social history reviewed and updated as indicated. Interim medical history since our last visit reviewed. Allergies and medications reviewed and updated.  Review of Systems  HENT: Negative.   Gastrointestinal:  Negative for abdominal distention, abdominal pain, anal bleeding, blood in stool and constipation.       Burping and gas.  Increasing Omeprazole to BID  Genitourinary: Negative.   Psychiatric/Behavioral: Negative.     Per HPI unless specifically indicated above     Objective:    BP 94/64   Pulse 83   Temp 98.5 F (36.9 C) (Oral)   Ht 5' 3.78" (1.62 m)   Wt 147 lb 6.4 oz (66.9 kg)   LMP  (LMP Unknown)   SpO2 95%   BMI 25.48 kg/m   Wt Readings from Last 3 Encounters:  07/27/17  147 lb 6.4 oz (66.9 kg)  05/09/17 156 lb 4.8 oz (70.9 kg)  02/17/17 165 lb (74.8 kg)    Physical Exam  Constitutional: She is oriented to person, place, and time. She appears well-developed and well-nourished.  HENT:  Head: Normocephalic and atraumatic.  Eyes: Pupils are equal, round, and reactive to light. Right eye exhibits no discharge. Left eye exhibits no discharge. No scleral icterus.  Neck: Normal range of motion. Neck supple. Carotid bruit is not present. No thyromegaly present.  Cardiovascular: Normal rate, regular rhythm and normal heart sounds. Exam reveals no gallop and no friction rub.  No murmur heard. Pulmonary/Chest: Effort normal and breath sounds normal. No respiratory distress. She has no wheezes. She has no rales. No breast tenderness or discharge.  Abdominal: Soft. Bowel sounds are normal. There is no tenderness. There is no rebound.  Genitourinary: No breast tenderness or discharge.  Musculoskeletal: Normal range of motion.  Lymphadenopathy:    She has no cervical adenopathy.  Neurological: She is alert and oriented to person, place, and time.  Skin: Skin is warm, dry and intact. No rash noted.  Psychiatric: She has a normal mood and affect. Her speech is normal and behavior is normal. Judgment and thought content normal. Cognition and memory are normal.    Results for orders placed or performed during the hospital encounter of 02/17/17  CBC  Result Value Ref Range   WBC 5.5  3.6 - 11.0 K/uL   RBC 4.28 3.80 - 5.20 MIL/uL   Hemoglobin 11.4 (L) 12.0 - 16.0 g/dL   HCT 35.1 35.0 - 47.0 %   MCV 82.0 80.0 - 100.0 fL   MCH 26.6 26.0 - 34.0 pg   MCHC 32.5 32.0 - 36.0 g/dL   RDW 16.6 (H) 11.5 - 14.5 %   Platelets 321 150 - 440 K/uL  Basic metabolic panel  Result Value Ref Range   Sodium 140 135 - 145 mmol/L   Potassium 3.8 3.5 - 5.1 mmol/L   Chloride 106 101 - 111 mmol/L   CO2 28 22 - 32 mmol/L   Glucose, Bld 116 (H) 65 - 99 mg/dL   BUN 10 6 - 20 mg/dL   Creatinine, Ser 0.70 0.44 - 1.00 mg/dL   Calcium 9.2 8.9 - 10.3 mg/dL   GFR calc non Af Amer >60 >60 mL/min   GFR calc Af Amer >60 >60 mL/min   Anion gap 6 5 - 15  TSH  Result Value Ref Range   TSH 2.217 0.350 - 4.500 uIU/mL      Assessment & Plan:   Problem List Items Addressed This Visit      Unprioritized   Abdominal pain   Relevant Medications   omeprazole (PRILOSEC) 20 MG capsule   Depression, major, recurrent, moderate ([place])    Per psychiatry.  Doing well despite the circumstances      Gastroesophageal reflux disease without esophagitis    Stop Fosamax.  Increase Omeprazole to BID      Relevant Medications   omeprazole (PRILOSEC) 20 MG capsule   H/O hypercholesterolemia    Check lipids and adjust Simvastatin accordingly      Relevant Orders   Lipid Panel w/o Chol/HDL Ratio   Osteoporosis    On fosamax and not clear about extent of bone loss.  Will check bone density and stop Alendronate which she has been on for a while.  Will consider medications based on bone density      Relevant Orders   DG  Bone Density   S/P ORIF (open reduction internal fixation) fracture    Complications.  Reviewed notes.  Second opinion is ongoing.  No decisions made      Tobacco abuse    Smokes 2-3/day.  Does not qualify for low dose CT.  Encouraged absolute cessation.  Ok to use nicotine replacement.         Other Visit Diagnoses    Need for Tdap vaccination    -  Primary   Relevant Orders   Tdap  vaccine greater than or equal to 7yo IM (Completed)   Annual physical exam       Relevant Orders   TSH   CBC with Differential/Platelet   Comprehensive metabolic panel      HM: Tdap updated.   Bone density ordered   Follow up plan: Return in about 6 months (around 01/27/2018).

## 2017-07-27 NOTE — Assessment & Plan Note (Signed)
Per psychiatry.  Doing well despite the circumstances

## 2017-07-27 NOTE — Assessment & Plan Note (Signed)
Smokes 2-3/day.  Does not qualify for low dose CT.  Encouraged absolute cessation.  Ok to use nicotine replacement.

## 2017-07-27 NOTE — Assessment & Plan Note (Signed)
Stop Fosamax.  Increase Omeprazole to BID

## 2017-07-27 NOTE — Assessment & Plan Note (Signed)
On fosamax and not clear about extent of bone loss.  Will check bone density and stop Alendronate which she has been on for a while.  Will consider medications based on bone density

## 2017-07-28 LAB — COMPREHENSIVE METABOLIC PANEL
A/G RATIO: 2 (ref 1.2–2.2)
ALK PHOS: 65 IU/L (ref 39–117)
ALT: 9 IU/L (ref 0–32)
AST: 16 IU/L (ref 0–40)
Albumin: 4.2 g/dL (ref 3.6–4.8)
BUN/Creatinine Ratio: 18 (ref 12–28)
BUN: 13 mg/dL (ref 8–27)
Bilirubin Total: 0.9 mg/dL (ref 0.0–1.2)
CO2: 26 mmol/L (ref 20–29)
Calcium: 9.6 mg/dL (ref 8.7–10.3)
Chloride: 102 mmol/L (ref 96–106)
Creatinine, Ser: 0.74 mg/dL (ref 0.57–1.00)
GFR calc Af Amer: 98 mL/min/{1.73_m2} (ref >59)
GFR, EST NON AFRICAN AMERICAN: 85 mL/min/{1.73_m2} (ref >59)
GLOBULIN, TOTAL: 2.1 g/dL (ref 1.5–4.5)
Glucose: 94 mg/dL (ref 65–99)
POTASSIUM: 4.4 mmol/L (ref 3.5–5.2)
Sodium: 141 mmol/L (ref 134–144)
Total Protein: 6.3 g/dL (ref 6.0–8.5)

## 2017-07-28 LAB — LIPID PANEL W/O CHOL/HDL RATIO
CHOLESTEROL TOTAL: 162 mg/dL (ref 100–199)
HDL: 56 mg/dL (ref >39)
LDL Calculated: 87 mg/dL (ref 0–99)
TRIGLYCERIDES: 93 mg/dL (ref 0–149)
VLDL Cholesterol Cal: 19 mg/dL (ref 5–40)

## 2017-07-28 LAB — CBC WITH DIFFERENTIAL/PLATELET
BASOS: 1 %
Basophils Absolute: 0 10*3/uL (ref 0.0–0.2)
EOS (ABSOLUTE): 0.1 10*3/uL (ref 0.0–0.4)
EOS: 2 %
Hematocrit: 46.2 % (ref 34.0–46.6)
Hemoglobin: 15.3 g/dL (ref 11.1–15.9)
Immature Grans (Abs): 0 10*3/uL (ref 0.0–0.1)
Immature Granulocytes: 0 %
LYMPHS ABS: 1.2 10*3/uL (ref 0.7–3.1)
Lymphs: 20 %
MCH: 31.9 pg (ref 26.6–33.0)
MCHC: 33.1 g/dL (ref 31.5–35.7)
MCV: 97 fL (ref 79–97)
MONOS ABS: 0.5 10*3/uL (ref 0.1–0.9)
Monocytes: 8 %
Neutrophils Absolute: 4.1 10*3/uL (ref 1.4–7.0)
Neutrophils: 69 %
Platelets: 242 10*3/uL (ref 150–450)
RBC: 4.79 x10E6/uL (ref 3.77–5.28)
RDW: 12.4 % (ref 12.3–15.4)
WBC: 6 10*3/uL (ref 3.4–10.8)

## 2017-07-28 LAB — TSH: TSH: 2.53 u[IU]/mL (ref 0.450–4.500)

## 2017-07-29 ENCOUNTER — Encounter: Payer: Self-pay | Admitting: Unknown Physician Specialty

## 2017-08-01 ENCOUNTER — Other Ambulatory Visit: Payer: Self-pay

## 2017-08-01 ENCOUNTER — Encounter: Payer: Self-pay | Admitting: Psychiatry

## 2017-08-01 ENCOUNTER — Ambulatory Visit: Payer: Federal, State, Local not specified - PPO | Admitting: Psychiatry

## 2017-08-01 VITALS — BP 92/64 | HR 102 | Temp 97.5°F | Wt 147.2 lb

## 2017-08-01 DIAGNOSIS — F331 Major depressive disorder, recurrent, moderate: Secondary | ICD-10-CM | POA: Diagnosis not present

## 2017-08-01 MED ORDER — PAROXETINE HCL 20 MG PO TABS: 20.0000 mg | ORAL_TABLET | Freq: Every day | ORAL | 1 refills | Status: DC

## 2017-08-01 MED ORDER — BUSPIRONE HCL 10 MG PO TABS: 10.0000 mg | ORAL_TABLET | Freq: Two times a day (BID) | ORAL | 1 refills | Status: DC

## 2017-08-01 MED ORDER — TRAZODONE HCL 100 MG PO TABS: 100.0000 mg | ORAL_TABLET | Freq: Every day | ORAL | 2 refills | Status: DC

## 2017-08-01 NOTE — Progress Notes (Signed)
BH MD/PA/NP OP Progress Note  08/01/2017 12:41 PM MAIZEE REINHOLD  MRN:  [id number]  Subjective:  Patient is a 67 year old female who presented for the follow-up appointment. She remains focused on her left shoulder as she reported that she is not improving and she went to the spine and hand specialist and they have evaluated her and she will have a follow-up appointment in 3 weeks.  Patient reported that she has stopped physical therapy as it was not helpful.  Patient reported that she continues to feel that her hand is like noddle and does not have any strength.  She reported that she is a struggling due to decrease in strength in her left hand and wants treatment for the same.  She reported that she is compliant with her medications.  She has been sleeping well at night.  She denied having any suicidal homicidal ideations or plans.  She denied having any perceptual disturbances.   She is also concerned about her son who has been using cocaine and alcohol on a persistent basis especially over the weekends.  She reported that he has been able to maintain his job at this time.  She reported that he is not looking for any help and she cannot force him to go to the drug rehabilitation center.  She reported that she feels safe staying at home with him at this time.  She reported that she has been asking him to seek help but he has been in denial.     Patient is compliant with the medications and does not need any medication changes.  She denied having any suicidal homicidal ideations or plans.      Chief Complaint:  Chief Complaint    Follow-up; Medication Refill     Visit Diagnosis:     ICD-10-CM   1. MDD (major depressive disorder), recurrent episode, moderate ([place]) F33.1     Past Medical History:  Past Medical History:  Diagnosis Date  . Breast cancer ([place]) 1997   right breast, radiation  . Bronchitis    recent/ 06/09/15 [facility]/resolved  . Cancer [facility]) 1997    right lumpectomy,L/Ad/R   . GERD (gastroesophageal reflux disease)   . Hypercholesteremia   . Hyperlipidemia   . Panic attack   . Personal history of malignant neoplasm of breast     Past Surgical History:  Procedure Laterality Date  . BICEPT TENODESIS Left 11/23/2016   Procedure: BICEPS TENODESIS;  Surgeon: Leim Fabry, MD;  Location: [facility];  Service: Orthopedics;  Laterality: Left;  . BREAST EXCISIONAL BIOPSY Right 1997   pos  . BREAST SURGERY Right 1997   lumpectomy  . CHOLECYSTECTOMY N/A 08/06/2016   Procedure: LAPAROSCOPIC CHOLECYSTECTOMY WITH INTRAOPERATIVE CHOLANGIOGRAM;  Surgeon: Christene Lye, MD;  Location: [facility];  Service: General;  Laterality: N/A;  . COLONOSCOPY  2008  . COLONOSCOPY WITH PROPOFOL N/A 07/21/2015   Procedure: COLONOSCOPY WITH PROPOFOL;  Surgeon: Lucilla Lame, MD;  Location: [place];  Service: Endoscopy;  Laterality: N/A;  . ESOPHAGOGASTRODUODENOSCOPY (EGD) WITH PROPOFOL N/A 06/16/2016   Procedure: ESOPHAGOGASTRODUODENOSCOPY (EGD) WITH PROPOFOL;  Surgeon: Christene Lye, MD;  Location: [facility];  Service: Endoscopy;  Laterality: N/A;  . HARDWARE REMOVAL Left 12/27/2016   Procedure: HARDWARE REMOVAL LEFT  PROXIMAL HUMEROUS;  Surgeon: Leim Fabry, MD;  Location: [facility];  Service: Orthopedics;  Laterality: Left;  . ORIF HUMERUS FRACTURE Left 11/23/2016   Procedure: OPEN REDUCTION INTERNAL FIXATION (ORIF) PROXIMAL HUMERUS FRACTURE;  Surgeon: Leim Fabry, MD;  Location: [facility];  Service: Orthopedics;  Laterality: Left;  . REVERSE SHOULDER ARTHROPLASTY Left 12/27/2016   Procedure: REVERSE SHOULDER ARTHROPLASTY;  Surgeon: Leim Fabry, MD;  Location: [facility];  Service: Orthopedics;  Laterality: Left;   Family History:  Family History  Problem Relation Age of Onset  . Anxiety disorder Brother   . Obesity Brother   . COPD Brother   . Kidney disease Mother   . Heart attack Father   . Hypertension Father   . Alcohol  abuse Father   . Depression Brother   . Anxiety disorder Brother   . Breast cancer Maternal Grandmother    Social History:  Social History   Socioeconomic History  . Marital status: Widowed    Spouse name: Not on file  . Number of children: 2  . Years of education: Not on file  . Highest education level: Bachelor's degree (e.g., BA, AB, BS)  Occupational History  . Not on file  Social Needs  . Financial resource strain: Hard  . Food insecurity:    Worry: Never true    Inability: Never true  . Transportation needs:    Medical: No    Non-medical: No  Tobacco Use  . Smoking status: Current Every Day Smoker    Packs/day: 0.25    Years: 5.00    Pack years: 1.25    Types: Cigarettes    Start date: 11/25/2009    Last attempt to quit: 03/09/2016    Years since quitting: 1.3  . Smokeless tobacco: Never Used  . Tobacco comment: Has tried patches and may go back on these again.  Says they work.   Substance and Sexual Activity  . Alcohol use: No    Alcohol/week: 0.0 oz    Comment: rarely  . Drug use: No  . Sexual activity: Never  Lifestyle  . Physical activity:    Days per week: 0 days    Minutes per session: 0 min  . Stress: Very much  Relationships  . Social connections:    Talks on phone: Not on file    Gets together: Not on file    Attends religious service: Never    Active member of club or organization: No    Attends meetings of clubs or organizations: Never    Relationship status: Widowed  Other Topics Concern  . Not on file  Social History Narrative  . Not on file   Additional History:  Currently her son is living with her and patient is trying to help him move out into an apartment. 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  Assessment:   Musculoskeletal: Strength & Muscle Tone: within normal limits Gait & Station: normal Patient leans: N/A  Psychiatric Specialty Exam: Medication Refill     Review of Systems   Psychiatric/Behavioral: Positive for depression. The patient is nervous/anxious.     Blood pressure 92/64, pulse (!) 102, temperature (!) 97.5 F (36.4 C), temperature source Oral, weight 147 lb 3.2 oz (66.8 kg).Body mass index is 25.44 kg/m.  General Appearance: Casual  Eye Contact:  Good  Speech:  Clear and Coherent  Volume:  Normal  Mood:  Anxious, Depressed and Dysphoric  Affect:  Congruent  Thought Process:  Coherent  Orientation:  Full (Time, Place, and Person)  Thought Content:  WDL  Suicidal Thoughts:  No  Homicidal Thoughts:  No  Memory:  Immediate;   Fair  Judgement:  Fair  Insight:  Fair  Psychomotor Activity:  Decreased  Concentration:  Fair  Recall:  [employer] of Knowledge: Fair  Language: Fair  Akathisia:  No  Handed:  Right  AIMS (if indicated):    Assets:  Communication Skills Desire for Improvement Social Support  ADL's:  Intact  Cognition: WNL  Sleep: 6- 7 with trazodone    Is the patient at risk to self?  No. Has the patient been a risk to self in the past 6 months?  No. Has the patient been a risk to self within the distant past?  No. Is the patient a risk to others?  No. Has the patient been a risk to others in the past 6 months?  No. Has the patient been a risk to others within the distant past?  No.  Current Medications: Current Outpatient Medications  Medication Sig Dispense Refill  . acetaminophen (TYLENOL) 500 MG tablet Take 500 mg every 6 (six) hours as needed by mouth for moderate pain or headache.    . alendronate (FOSAMAX) 70 MG tablet TAKE ONE TABLET BY MOUTH EACH WEEK 12 tablet 3  . augmented betamethasone dipropionate (DIPROLENE AF) 0.05 % cream Apply topically 2 (two) times daily. 30 g 0  . busPIRone (BUSPAR) 10 MG tablet Take 1 tablet (10 mg total) by mouth 2 (two) times daily. 180 tablet 1  . Calcium Carbonate-Vitamin D (CALCIUM + D PO) Take 1 tablet daily by mouth.     . Cholecalciferol (VITAMIN D) 2000 units tablet Take 2,000  Units by mouth daily.    . Ferrous Gluconate (IRON 27 PO) Take 27 mg by mouth daily.    . Multiple Vitamin (MULTIVITAMIN) tablet Take 1 tablet daily by mouth.     Marland Kitchen omeprazole (PRILOSEC) 20 MG capsule Take 1 capsule (20 mg total) by mouth 2 (two) times daily before a meal. 180 capsule 3  . PARoxetine (PAXIL) 20 MG tablet Take 1 tablet (20 mg total) by mouth daily before supper. 90 tablet 1  . simvastatin (ZOCOR) 20 MG tablet Take 1 tablet (20 mg total) by mouth at bedtime. bedtime 90 tablet 3  . traMADol (ULTRAM) 50 MG tablet Take 1 - 2 tablets by mouth every 6 hours as needed for pain not to exceed 8 tablets a day    . traZODone (DESYREL) 100 MG tablet Take 1 tablet (100 mg total) by mouth at bedtime. 90 tablet 2   No current facility-administered medications for this visit.     Medical Decision Making:  Review of Psycho-Social Stressors (1) and Review and summation of old records (2)  Treatment Plan Summary:Medication management   Continue medications as follows    Paxil 20 mg by mouth daily at 3pm  Continue trazodone 100 mg at bedtime. Continue BuSpar 10 mg by mouth twice a day      More than 50% of the time spent in psychoeducation, counseling and coordination of care.   Follow-up in 1 months or earlier depending on her symptoms  This note was generated in part or whole with voice recognition software. Voice regonition is usually quite accurate but there are transcription errors that can and very often do occur. I apologize for any typographical errors that were not detected and corrected.    Rainey Pines, MD    08/01/2017, 12:41 PM

## 2017-08-09 ENCOUNTER — Ambulatory Visit
Admission: RE | Admit: 2017-08-09 | Discharge: 2017-08-09 | Disposition: A | Discharge status: Home / Self Care | Payer: Federal, State, Local not specified - PPO | Source: Ambulatory Visit | Attending: Unknown Physician Specialty | Admitting: Unknown Physician Specialty

## 2017-08-09 ENCOUNTER — Encounter: Payer: Self-pay | Admitting: Unknown Physician Specialty

## 2017-08-09 DIAGNOSIS — M810 Age-related osteoporosis without current pathological fracture: Secondary | ICD-10-CM | POA: Diagnosis present

## 2017-08-22 ENCOUNTER — Telehealth: Payer: Self-pay | Admitting: *Deleted

## 2017-08-22 NOTE — Telephone Encounter (Signed)
Contacted patient regarding lung screening referral. Reviewed smoking history. Patient reports smoking on and off and totals years smoking at 15 with the most packs per day at .5. Therefore, she is aware that she does not meet the eligibility requirements for lung screening.

## 2017-10-03 ENCOUNTER — Other Ambulatory Visit: Payer: Self-pay

## 2017-10-03 ENCOUNTER — Encounter: Payer: Self-pay | Admitting: Psychiatry

## 2017-10-03 ENCOUNTER — Ambulatory Visit (INDEPENDENT_AMBULATORY_CARE_PROVIDER_SITE_OTHER): Payer: Federal, State, Local not specified - PPO | Admitting: Psychiatry

## 2017-10-03 VITALS — BP 86/64 | HR 108 | Wt 143.0 lb

## 2017-10-03 DIAGNOSIS — F331 Major depressive disorder, recurrent, moderate: Secondary | ICD-10-CM

## 2017-10-03 MED ORDER — TRAZODONE HCL 100 MG PO TABS: 100.0000 mg | ORAL_TABLET | Freq: Every day | ORAL | 2 refills | Status: DC

## 2017-10-03 MED ORDER — PAROXETINE HCL 20 MG PO TABS: 20.0000 mg | ORAL_TABLET | Freq: Every day | ORAL | 1 refills | Status: DC

## 2017-10-03 MED ORDER — BUSPIRONE HCL 10 MG PO TABS: 10.0000 mg | ORAL_TABLET | Freq: Two times a day (BID) | ORAL | 1 refills | Status: DC
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
## 2017-10-17 ENCOUNTER — Emergency Department: Payer: Federal, State, Local not specified - PPO

## 2017-10-17 ENCOUNTER — Other Ambulatory Visit: Payer: Self-pay

## 2017-10-17 DIAGNOSIS — Z9049 Acquired absence of other specified parts of digestive tract: Secondary | ICD-10-CM | POA: Insufficient documentation

## 2017-10-17 DIAGNOSIS — Z853 Personal history of malignant neoplasm of breast: Secondary | ICD-10-CM | POA: Insufficient documentation

## 2017-10-17 DIAGNOSIS — K449 Diaphragmatic hernia without obstruction or gangrene: Secondary | ICD-10-CM | POA: Insufficient documentation

## 2017-10-17 DIAGNOSIS — F17210 Nicotine dependence, cigarettes, uncomplicated: Secondary | ICD-10-CM | POA: Insufficient documentation

## 2017-10-17 DIAGNOSIS — R1030 Lower abdominal pain, unspecified: Secondary | ICD-10-CM | POA: Diagnosis present

## 2017-10-17 DIAGNOSIS — Z79899 Other long term (current) drug therapy: Secondary | ICD-10-CM | POA: Diagnosis not present

## 2017-10-17 LAB — CBC
HEMATOCRIT: 43.9 % (ref 35.0–47.0)
Hemoglobin: 15.7 g/dL (ref 12.0–16.0)
MCH: 33.7 pg (ref 26.0–34.0)
MCHC: 35.7 g/dL (ref 32.0–36.0)
MCV: 94.4 fL (ref 80.0–100.0)
Platelets: 246 10*3/uL (ref 150–440)
RBC: 4.66 MIL/uL (ref 3.80–5.20)
RDW: 13 % (ref 11.5–14.5)
WBC: 8.2 10*3/uL (ref 3.6–11.0)

## 2017-10-17 LAB — COMPREHENSIVE METABOLIC PANEL
ALBUMIN: 4.1 g/dL (ref 3.5–5.0)
ALT: 14 U/L (ref 0–44)
ANION GAP: 7 (ref 5–15)
AST: 17 U/L (ref 15–41)
Alkaline Phosphatase: 64 U/L (ref 38–126)
BILIRUBIN TOTAL: 1.3 mg/dL — AB (ref 0.3–1.2)
BUN: 14 mg/dL (ref 8–23)
CO2: 26 mmol/L (ref 22–32)
Calcium: 9.4 mg/dL (ref 8.9–10.3)
Chloride: 103 mmol/L (ref 98–111)
Creatinine, Ser: 0.7 mg/dL (ref 0.44–1.00)
GFR calc Af Amer: >60 mL/min (ref >60)
GLUCOSE: 101 mg/dL — AB (ref 70–99)
POTASSIUM: 3.6 mmol/L (ref 3.5–5.1)
Sodium: 136 mmol/L (ref 135–145)
TOTAL PROTEIN: 7 g/dL (ref 6.5–8.1)

## 2017-10-17 LAB — LIPASE, BLOOD: LIPASE: 32 U/L (ref 11–51)

## 2017-10-17 LAB — URINALYSIS, COMPLETE (UACMP) WITH MICROSCOPIC
BILIRUBIN URINE: NEGATIVE
GLUCOSE, UA: NEGATIVE mg/dL
HGB URINE DIPSTICK: NEGATIVE
Ketones, ur: NEGATIVE mg/dL
LEUKOCYTES UA: NEGATIVE
NITRITE: NEGATIVE
PH: 6 (ref 5.0–8.0)
Protein, ur: NEGATIVE mg/dL
Specific Gravity, Urine: 1.013 (ref 1.005–1.030)

## 2017-10-17 NOTE — ED Triage Notes (Signed)
Pt in with co mid abd pain for 2-3 weeks, states no n.v.d. Pt has no hx of the same. States worse when she eats.

## 2017-10-18 ENCOUNTER — Emergency Department: Payer: Federal, State, Local not specified - PPO

## 2017-10-18 ENCOUNTER — Emergency Department
Admission: EM | Admit: 2017-10-18 | Discharge: 2017-10-18 | Disposition: A | Discharge status: Home / Self Care | Payer: Federal, State, Local not specified - PPO | Attending: Emergency Medicine | Admitting: Emergency Medicine

## 2017-10-18 ENCOUNTER — Encounter: Payer: Self-pay | Admitting: Radiology

## 2017-10-18 ENCOUNTER — Ambulatory Visit: Payer: Federal, State, Local not specified - PPO | Admitting: Family Medicine

## 2017-10-18 DIAGNOSIS — R52 Pain, unspecified: Secondary | ICD-10-CM

## 2017-10-18 DIAGNOSIS — K449 Diaphragmatic hernia without obstruction or gangrene: Secondary | ICD-10-CM

## 2017-10-18 DIAGNOSIS — R1084 Generalized abdominal pain: Secondary | ICD-10-CM

## 2017-10-18 MED ORDER — MORPHINE SULFATE (PF) 4 MG/ML IV SOLN
4.0000 mg | Freq: Once | INTRAVENOUS | Status: AC
  Administered 2017-10-18: 4 mg via INTRAVENOUS
  Filled 2017-10-18: qty 1

## 2017-10-18 MED ORDER — DICYCLOMINE HCL 10 MG PO CAPS: 10.0000 mg | ORAL_CAPSULE | Freq: Three times a day (TID) | ORAL | 0 refills | Status: DC | PRN

## 2017-10-18 MED ORDER — IOPAMIDOL (ISOVUE-M 300) INJECTION 61%: 15.0000 mL | Freq: Once | INTRAMUSCULAR | Status: DC | PRN

## 2017-10-18 MED ORDER — HYDROCODONE-ACETAMINOPHEN 5-325 MG PO TABS
2.0000 | ORAL_TABLET | Freq: Once | ORAL | Status: AC
  Administered 2017-10-18: 1 via ORAL
  Filled 2017-10-18: qty 2

## 2017-10-18 MED ORDER — DICYCLOMINE HCL 10 MG PO CAPS
10.0000 mg | ORAL_CAPSULE | Freq: Once | ORAL | Status: AC
  Administered 2017-10-18: 10 mg via ORAL
  Filled 2017-10-18: qty 1

## 2017-10-18 MED ORDER — IOPAMIDOL (ISOVUE-300) INJECTION 61%
100.0000 mL | Freq: Once | INTRAVENOUS | Status: AC | PRN
  Administered 2017-10-18: 100 mL via INTRAVENOUS

## 2017-10-18 MED ORDER — ONDANSETRON HCL 4 MG/2ML IJ SOLN
4.0000 mg | INTRAMUSCULAR | Status: AC
  Administered 2017-10-18: 4 mg via INTRAVENOUS
  Filled 2017-10-18: qty 2

## 2017-10-18 MED ORDER — IOPAMIDOL (ISOVUE-300) INJECTION 61%
30.0000 mL | Freq: Once | INTRAVENOUS | Status: AC | PRN
  Administered 2017-10-18: 30 mL via ORAL

## 2017-10-18 MED ORDER — IOHEXOL 300 MG/ML  SOLN: 30.0000 mL | Freq: Once | INTRAMUSCULAR | Status: DC | PRN

## 2017-10-18 MED ORDER — HYDROCODONE-ACETAMINOPHEN 5-325 MG PO TABS: 1.0000 | ORAL_TABLET | ORAL | 0 refills | Status: DC | PRN

## 2017-10-18 MED ORDER — DOCUSATE SODIUM 100 MG PO CAPS: ORAL_CAPSULE | ORAL | 0 refills | Status: DC

## 2017-10-18 MED ORDER — FOSFOMYCIN TROMETHAMINE 3 G PO PACK
3.0000 g | PACK | Freq: Once | ORAL | Status: AC
  Administered 2017-10-18: 3 g via ORAL
  Filled 2017-10-18: qty 3

## 2017-10-18 NOTE — ED Provider Notes (Signed)
[facility] Emergency Department Provider Note  ____________________________________________   First MD Initiated Contact with Patient 10/18/17 0012     (approximate)  I have reviewed the triage vital signs and the nursing notes.   HISTORY  Chief Complaint Abdominal Pain    HPI Chelsey [name] is a 67 y.o. female with medical history as listed below who presents for evaluation of 2 to 3 weeks of intermittent abdominal pain that is generally low in the abdomen and worse after she eats and is a dull, aching but severe pain.  However for the last 24 hours or so it has been constant and unrelated to eating.  Nothing in particular makes it better.  She states that it is severe.  She denies nausea and vomiting as well as diarrhea.  She has occasional constipation.  She has been burping a lot but also states that she has been passing gas.  She has had little to eat today because of the abdominal pain.  She denies fever/chills, chest pain, shortness of breath, cough, and dysuria.  She had a cholecystectomy about a year ago but denies any other abdominal surgeries.  She does have a history of GERD and takes daily omeprazole.   Past Medical History:  Diagnosis Date  . Breast cancer ([place]) 1997   right breast, radiation  . Bronchitis    recent/ 06/09/15 had chest xray/ [facility]/resolved  . Cancer [facility]) 1997   right lumpectomy,L/Ad/R   . GERD (gastroesophageal reflux disease)   . Hypercholesteremia   . Hyperlipidemia   . Panic attack   . Personal history of malignant neoplasm of breast     Patient Active Problem List   Diagnosis Date Noted  . Osteoporosis 07/27/2017  . Tobacco abuse 07/27/2017  . Painful orthopaedic hardware ([place]) 06/24/2017  . Overweight with body mass index (BMI) 25.0-29.9 05/18/2017  . 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 Insomnia 01/12/2017  . Anemia 01/12/2017  . Status post shoulder replacement  12/27/2016  . Pressure injury of skin 12/27/2016  . S/P ORIF (open reduction internal fixation) fracture 11/23/2016  . Proximal humerus fracture 11/23/2016  . Panic attack 08/17/2016  . Abdominal pain 05/21/2016  . Special screening for malignant neoplasms, colon   . Benign neoplasm of descending colon   . Benign neoplasm of sigmoid colon   . Gastroesophageal reflux disease without esophagitis 06/13/2014  . H/O hypercholesterolemia 06/13/2014  . Depression, major, recurrent, moderate ([place]) 06/13/2014  . History of breast cancer 03/12/2013    Past Surgical History:  Procedure Laterality Date  . BICEPT TENODESIS Left 11/23/2016   Procedure: BICEPS TENODESIS;  Surgeon: Leim Fabry, MD;  Location: [facility] ORS;  Service: Orthopedics;  Laterality: Left;  . BREAST EXCISIONAL BIOPSY Right 1997   pos  . BREAST SURGERY Right 1997   lumpectomy  . CHOLECYSTECTOMY N/A 08/06/2016   Procedure: LAPAROSCOPIC CHOLECYSTECTOMY WITH INTRAOPERATIVE CHOLANGIOGRAM;  Surgeon: Christene Lye, MD;  Location: [facility] OR[facility];  Service: General;  Laterality: N/A;  . COLONOSCOPY  2008  . COLONOSCOPY WITH PROPOFOL N/A 07/21/2015   Procedure: COLONOSCOPY WITH PROPOFOL;  Surgeon: Lucilla Lame, MD;  Location: [place];  Service: Endoscopy;  Laterality: N/A;  . ESOPHAGOGASTRODUODENOSCOPY (EGD) WITH PROPOFOL N/A 06/16/2016   Procedure: ESOPHAGOGASTRODUODENOSCOPY (EGD) WITH PROPOFOL;  Surgeon: Christene Lye, MD;  Location: [facility];  Service: Endoscopy;  Laterality: N/A;  . HARDWARE REMOVAL Left 12/27/2016   Procedure: HARDWARE REMOVAL LEFT  PROXIMAL HUMEROUS;  Surgeon: Posey Pronto,  Tarry Kos, MD;  Location: [facility];  Service: Orthopedics;  Laterality: Left;  . ORIF HUMERUS FRACTURE Left 11/23/2016   Procedure: OPEN REDUCTION INTERNAL FIXATION (ORIF) PROXIMAL HUMERUS FRACTURE;  Surgeon: Leim Fabry, MD;  Location: [facility];  Service: Orthopedics;  Laterality: Left;  . REVERSE SHOULDER ARTHROPLASTY Left  12/27/2016   Procedure: REVERSE SHOULDER ARTHROPLASTY;  Surgeon: Leim Fabry, MD;  Location: [facility];  Service: Orthopedics;  Laterality: Left;    Prior to Admission medications   Medication Sig Start Date End Date Taking? Authorizing Provider  acetaminophen (TYLENOL) 500 MG tablet Take 500 mg every 6 (six) hours as needed by mouth for moderate pain or headache.    [provider]  alendronate (FOSAMAX) 70 MG tablet TAKE ONE TABLET BY MOUTH EACH WEEK 06/07/17   Kathrine Haddock, NP  augmented betamethasone dipropionate (DIPROLENE AF) 0.05 % cream Apply topically 2 (two) times daily. 05/09/17   Kathrine Haddock, NP  busPIRone (BUSPAR) 10 MG tablet Take 1 tablet (10 mg total) by mouth 2 (two) times daily. 10/03/17   Rainey Pines, MD  Calcium Carbonate-Vitamin D (CALCIUM + D PO) Take 1 tablet daily by mouth.     [provider]  Cholecalciferol (VITAMIN D) 2000 units tablet Take 2,000 Units by mouth daily.    [provider]  dicyclomine (BENTYL) 10 MG capsule Take 1 capsule (10 mg total) by mouth 3 (three) times daily as needed for up to 14 days for spasms. or abdominal pain 10/18/17 11/01/17  Hinda Kehr, MD  docusate sodium (COLACE) 100 MG capsule Take 1 tablet once or twice daily as needed for constipation while taking narcotic pain medicine 10/18/17   Hinda Kehr, MD  Ferrous Gluconate (IRON 27 PO) Take 27 mg by mouth daily.    [provider]  gabapentin (NEURONTIN) 100 MG capsule Take by mouth. 08/16/17 08/16/18  [provider]  HYDROcodone-acetaminophen (NORCO/VICODIN) 5-325 MG tablet Take 1-2 tablets by mouth every 4 (four) hours as needed for moderate pain. 10/18/17   Hinda Kehr, MD  Multiple Vitamin (MULTIVITAMIN) tablet Take 1 tablet daily by mouth.     [provider]  omeprazole (PRILOSEC) 20 MG capsule Take 1 capsule (20 mg total) by mouth 2 (two) times daily before a meal. 07/27/17   Kathrine Haddock, NP  PARoxetine (PAXIL) 20 MG tablet  Take 1 tablet (20 mg total) by mouth daily before supper. 10/03/17   Rainey Pines, MD  simvastatin (ZOCOR) 20 MG tablet Take 1 tablet (20 mg total) by mouth at bedtime. bedtime 01/12/17   Kathrine Haddock, NP  traMADol (ULTRAM) 50 MG tablet Take 1 - 2 tablets by mouth every 6 hours as needed for pain not to exceed 8 tablets a day 07/26/17   [provider]  traZODone (DESYREL) 100 MG tablet Take 1 tablet (100 mg total) by mouth at bedtime. 10/03/17   Rainey Pines, MD    Allergies Patient has no known allergies.  Family History  Problem Relation Age of Onset  . Anxiety disorder Brother   . Obesity Brother   . COPD Brother   . Kidney disease Mother   . Heart attack Father   . Hypertension Father   . Alcohol abuse Father   . Depression Brother   . Anxiety disorder Brother   . Breast cancer Maternal Grandmother     Social History Social History   Tobacco Use  . Smoking status: Current Every Day Smoker    Packs/day: 0.25    Years: 5.00  Pack years: 1.25    Types: Cigarettes    Start date: 11/25/2009    Last attempt to quit: 03/09/2016    Years since quitting: 1.6  . Smokeless tobacco: Never Used  . Tobacco comment: Has tried patches and may go back on these again.  Says they work.   Substance Use Topics  . Alcohol use: No    Alcohol/week: 0.0 standard drinks    Comment: rarely  . Drug use: No    Review of Systems Constitutional: No fever/chills Eyes: No visual changes. ENT: No sore throat. Cardiovascular: Denies chest pain. Respiratory: Denies shortness of breath. Gastrointestinal: Abdominal pain as described above.  Denies nausea, vomiting, and diarrhea.  Occasional constipation. Genitourinary: Negative for dysuria. Musculoskeletal: Negative for neck pain.  Negative for back pain. Integumentary: Negative for rash. Neurological: Negative for headaches, focal weakness or numbness.   ____________________________________________   PHYSICAL EXAM:  VITAL  SIGNS: ED Triage Vitals  Enc Vitals Group     BP 10/17/17 1926 96/70     Pulse Rate 10/17/17 1925 (!) 102     Resp 10/17/17 1925 18     Temp 10/17/17 1925 98.2 F (36.8 C)     Temp Source 10/17/17 1925 Oral     SpO2 10/17/17 1925 97 %     Weight 10/17/17 1925 63.5 kg (140 lb)     Height 10/17/17 1925 1.651 m (5\' 5" )     Head Circumference --      Peak Flow --      Pain Score 10/17/17 1925 8     Pain Loc --      Pain Edu? --      Excl. in [place]? --     Constitutional: Alert and oriented.  Generally well-appearing but does appear anxious and somewhat uncomfortable. Eyes: Conjunctivae are normal.  Head: Atraumatic. Nose: No congestion/rhinnorhea. Mouth/Throat: Mucous membranes are moist. Neck: No stridor.  No meningeal signs.   Cardiovascular: Normal rate, regular rhythm. Good peripheral circulation. Grossly normal heart sounds. Respiratory: Normal respiratory effort.  No retractions. Lungs CTAB. Gastrointestinal: Soft and nondistended.  Minimal tenderness to palpation throughout the lower abdomen.  Negative Murphy sign, no tenderness at McBurney's point. Musculoskeletal: No lower extremity tenderness nor edema. No gross deformities of extremities. Neurologic:  Normal speech and language. No gross focal neurologic deficits are appreciated.  Skin:  Skin is warm, dry and intact. No rash noted. Psychiatric: Mood and affect are normal. Speech and behavior are normal.  ____________________________________________   LABS (all labs ordered are listed, but only abnormal results are displayed)  Labs Reviewed  COMPREHENSIVE METABOLIC PANEL - Abnormal; Notable for the following components:      Result Value   Glucose, Bld 101 (*)    Total Bilirubin 1.3 (*)    All other components within normal limits  URINALYSIS, COMPLETE (UACMP) WITH MICROSCOPIC - Abnormal; Notable for the following components:   Color, Urine YELLOW (*)    APPearance CLEAR (*)    Bacteria, UA RARE (*)    All other  components within normal limits  URINE CULTURE  CBC  LIPASE, BLOOD   ____________________________________________  EKG  No indication for EKG ____________________________________________  RADIOLOGY   ED MD interpretation: Large hiatal hernia, biliary ductal dilatation likely secondary to  Prior cholecystectomy  Official radiology report(s): Ct Abdomen Pelvis W Contrast  Result Date: 10/18/2017 CLINICAL DATA:  67 y/o  F; mid abdominal pain for 2-3 weeks. EXAM: CT ABDOMEN AND PELVIS WITH CONTRAST TECHNIQUE: Multidetector CT imaging  of the abdomen and pelvis was performed using the standard protocol following bolus administration of intravenous contrast. CONTRAST:  100 cc Isovue-300 COMPARISON:  06/04/2016 MRI of the abdomen and pelvis. 07/09/2016 hepatobiliary scintigraphy. FINDINGS: Lower chest: Large hiatal hernia containing the stomach. Hepatobiliary: No focal liver lesion identified. Cholecystectomy. Mild intra and extrahepatic biliary ductal dilatation the common bile duct measuring up to 10 mm. No obstructing stone or mass identified. Pancreas: Unremarkable. No pancreatic ductal dilatation or surrounding inflammatory changes. Spleen: Normal in size without focal abnormality. Adrenals/Urinary Tract: Adrenal glands are unremarkable. Kidneys are normal, without renal calculi, focal lesion, or hydronephrosis. Bladder is unremarkable. Stomach/Bowel: Stomach is within normal limits. Appendix appears normal. No evidence of bowel wall thickening, distention, or inflammatory changes. Vascular/Lymphatic: Aortic atherosclerosis. No enlarged abdominal or pelvic lymph nodes. Reproductive: Uterus and bilateral adnexa are unremarkable. Other: No abdominal wall hernia or abnormality. No abdominopelvic ascites. Musculoskeletal: L4-5 and L5-S1 grade 1 anterolisthesis with prominent facet arthropathy. No acute osseous abnormality identified. IMPRESSION: 1. Large hiatal hernia containing the stomach. 2. Mild  intra and extrahepatic biliary ductal dilatation, probably compensatory post cholecystectomy, correlation with liver function test recommended. No obstructing stone or mass identified. 3. Lumbar spondylosis with grade 1 L4-5 and L5-S1 anterolisthesis. 4.  Aortic Atherosclerosis (ICD10-I70.0). Electronically Signed   By: Kristine Garbe M.D.   On: 10/18/2017 02:55    ____________________________________________   PROCEDURES  Critical Care performed: No   Procedure(s) performed:   Procedures   ____________________________________________   INITIAL IMPRESSION / ASSESSMENT AND PLAN / ED COURSE  As part of my medical decision making, I reviewed the following data within the [place] notes reviewed and incorporated, Labs reviewed , Old chart reviewed and [place] Controlled Substance Database    Differential diagnosis includes, but is not limited to, acid reflux, ulcers, SBO/ileus, diverticulitis, appendicitis.  Vital signs are reassuring; even though the blood pressure is slightly on the low side she is also of relatively small stature and thin.  Conference of metabolic panel is within normal limits except for a very slightly elevated total bilirubin.  CBC is normal, lipase normal, urinalysis normal except for a few white blood cells for which I will add on a urine culture but she is not having any dysuria and I think this is not indicative of her current issue.  Given the combination of symptoms she is reporting I will obtain a CT scan with oral and IV contrast to rule out acute intra-abdominal infection in the setting of some chronic abdominal pain that became acutely worse over the last 24 hours.  She agrees with the plan.  I have also ordered morphine 4 mg IV and Zofran 4 mg IV.  Clinical Course as of Oct 18 705  Tue Oct 18, 2017  5597 No acute abnormalities identified on the CT scan of than a large hiatal hernia.  Ductal dilatation of the common bile duct  is likely secondary to her cholecystectomy and she has no corresponding LFT elevation.   Patient is ambulatory although she does still appear uncomfortable.  I explained the results and the need for follow-up with gastroenterology.  I am administering Bentyl 10 mg by mouth and Norco 2 tablets, prescribing Bentyl and Norco after checking the drug database and saying that she poses a low risk for abuse, and recommended close GI follow-up.  She understands and agrees with the plan.  CT ABDOMEN PELVIS W CONTRAST [CF]  0335  I gave my usual and customary return  precautions.   [CF]  [phone] Of note, I am also treating with a one-time dose of fosfomycin for a possible urinary tract infection.  Urine culture is pending as well.   [CF]    Clinical Course User Index [CF] Hinda Kehr, MD    ____________________________________________  FINAL CLINICAL IMPRESSION(S) / ED DIAGNOSES  Final diagnoses:  Pain  Generalized abdominal pain  Hiatal hernia     MEDICATIONS GIVEN DURING THIS VISIT:  Medications  morphine 4 MG/ML injection 4 mg (4 mg Intravenous Given 10/18/17 0050)  ondansetron (ZOFRAN) injection 4 mg (4 mg Intravenous Given 10/18/17 0050)  iopamidol (ISOVUE-300) 61 % injection 30 mL (30 mLs Oral Contrast Given 10/18/17 0151)  iopamidol (ISOVUE-300) 61 % injection 100 mL (100 mLs Intravenous Contrast Given 10/18/17 0201)  dicyclomine (BENTYL) capsule 10 mg (10 mg Oral Given 10/18/17 0353)  HYDROcodone-acetaminophen (NORCO/VICODIN) 5-325 MG per tablet 2 tablet (1 tablet Oral Given 10/18/17 0353)  fosfomycin (MONUROL) packet 3 g (3 g Oral Given 10/18/17 0353)     ED Discharge Orders         Ordered    dicyclomine (BENTYL) 10 MG capsule  3 times daily PRN     10/18/17 0337    HYDROcodone-acetaminophen (NORCO/VICODIN) 5-325 MG tablet  Every 4 hours PRN     10/18/17 0337    docusate sodium (COLACE) 100 MG capsule     10/18/17 4888           Note:  This document was prepared using Dragon  voice recognition software and may include unintentional dictation errors.    Hinda Kehr, MD 10/18/17 [phone]

## 2017-10-18 NOTE — ED Notes (Signed)
Pt to CT

## 2017-10-18 NOTE — ED Notes (Signed)
Pt to the ER for abd pain x 2 weeks. Pt denies nausea or vomiting. Pt has a hx of reflux and takes omeprazole. Pt was unable to get her primary docotr to see her. Pain has increased today.

## 2017-10-18 NOTE — Discharge Instructions (Signed)
You have been seen in the Emergency Department (ED) for abdominal pain.  Your evaluation did not identify a clear cause of your symptoms but was generally reassuring.  You have a large hiatal hernia which may be causing your pain.  We encourage you to follow up with a GI doctor such as Dr. Vicente Males at the next available opportunity.  Please follow up as instructed above regarding todays emergent visit and the symptoms that are bothering you.  Take Norco as prescribed for severe pain. Do not drink alcohol, drive or participate in any other potentially dangerous activities while taking this medication as it may make you sleepy. Do not take this medication with any other sedating medications, either prescription or over-the-counter. If you were prescribed Percocet or Vicodin, do not take these with acetaminophen (Tylenol) as it is already contained within these medications.   This medication is an opiate (or narcotic) pain medication and can be habit forming.  Use it as little as possible to achieve adequate pain control.  Do not use or use it with extreme caution if you have a history of opiate abuse or dependence.  If you are on a pain contract with your primary care doctor or a pain specialist, be sure to let them know you were prescribed this medication today from the [facility] Emergency Department.  This medication is intended for your use only - do not give any to anyone else and keep it in a secure place where nobody else, especially children, have access to it.  It will also cause or worsen constipation, so you may want to consider taking an over-the-counter stool softener while you are taking this medication.  Return to the ED if your abdominal pain worsens or fails to improve, you develop bloody vomiting, bloody diarrhea, you are unable to tolerate fluids due to vomiting, fever greater than 101, or other symptoms that concern you.

## 2017-10-18 NOTE — ED Notes (Signed)
Report received from [facility]. Patient care assumed. Patient/RN introduction complete. Pt still co mid abd pain rates it 9/10 at this time. Awaiting CT results and disposition, family at bedside.

## 2017-10-18 NOTE — ED Notes (Signed)
Patient discharged to home per MD order. Patient in stable condition, and deemed medically cleared by ED provider for discharge. Discharge instructions reviewed with patient/family using "Teach Back"; verbalized understanding of medication education and administration, and information about follow-up care. Denies further concerns. ° °

## 2017-10-19 ENCOUNTER — Ambulatory Visit: Payer: Federal, State, Local not specified - PPO | Admitting: Gastroenterology

## 2017-10-19 ENCOUNTER — Other Ambulatory Visit: Payer: Self-pay

## 2017-10-19 ENCOUNTER — Encounter: Payer: Self-pay | Admitting: Gastroenterology

## 2017-10-19 VITALS — BP 92/67 | HR 89 | Ht 63.0 in | Wt 138.2 lb

## 2017-10-19 DIAGNOSIS — K5900 Constipation, unspecified: Secondary | ICD-10-CM | POA: Diagnosis not present

## 2017-10-19 DIAGNOSIS — F17200 Nicotine dependence, unspecified, uncomplicated: Secondary | ICD-10-CM

## 2017-10-19 DIAGNOSIS — R1013 Epigastric pain: Secondary | ICD-10-CM

## 2017-10-19 DIAGNOSIS — R9389 Abnormal findings on diagnostic imaging of other specified body structures: Secondary | ICD-10-CM

## 2017-10-19 DIAGNOSIS — K831 Obstruction of bile duct: Secondary | ICD-10-CM

## 2017-10-19 NOTE — Progress Notes (Signed)
Jonathon Bellows MD, MRCP(U.K) [place]  [place], [place] [address]  Main: [phone]  Fax: [phone]   Gastroenterology Consultation  Referring Provider:     Guadalupe Maple, MD Primary Care Physician:  Guadalupe Maple, MD Primary Gastroenterologist:  Dr. Jonathon Bellows  Reason for Consultation:     Abdominal pain         HPI:   Chelsey [name] is a 67 y.o. y/o female referred for consultation & management  by Dr. Jeananne Rama, Jeannette How, MD.     She was seen in the ER yesterday for abdominal pain.   Abdominal pain: Onset: Says it has been ongoing for weeks and been getting worse, yesterday was milder. She says when ever she eats she gets the pain. Occurs almost immediately.  Site :lower abdomen , usually lasts for hours, burps all the tme  Radiation: localized  Severity : 10/10  Nature of pain: like a squeeze Aggravating factors: eating , worse when she is sitting and better when she stands ip Relieving factors :nothing - goes off on its own  Weight loss: yes - had some shoulder surgery and says since then lost 40 lbs  NSAID use: Ibuprofen - few times a week - few months for her shoulder  PPI use :Omeprazole BID- may have missed doses  Gall bladder surgery: yes  Frequency of bowel movements: sometimes every day - at times every few days, presently some every day . Change in bowel movements: no change  Relief with bowel movements: may be  Gas/Bloating/Abdominal distension: gas   No artificial sugars in her diet , she consumes 1 can of soda a day .   She had a CT scan of the abdomen and it demonstrated mild intra and extra hepatic biliary dilation. No stones seen. Large hiatal hernia. CBD 10 mm and s/p cholecystectomy. LFT were normal except an isolated elevated T bil of 1.3 .     Past Medical History:  Diagnosis Date  . Breast cancer ([place]) 1997   right breast, radiation  . Bronchitis    recent/ 06/09/15 [facility]/resolved  .  Cancer [facility]) 1997   right lumpectomy,L/Ad/R   . GERD (gastroesophageal reflux disease)   . Hypercholesteremia   . Hyperlipidemia   . Panic attack   . Personal history of malignant neoplasm of breast     Past Surgical History:  Procedure Laterality Date  . BICEPT TENODESIS Left 11/23/2016   Procedure: BICEPS TENODESIS;  Surgeon: Leim Fabry, MD;  Location: [facility];  Service: Orthopedics;  Laterality: Left;  . BREAST EXCISIONAL BIOPSY Right 1997   pos  . BREAST SURGERY Right 1997   lumpectomy  . CHOLECYSTECTOMY N/A 08/06/2016   Procedure: LAPAROSCOPIC CHOLECYSTECTOMY WITH INTRAOPERATIVE CHOLANGIOGRAM;  Surgeon: Christene Lye, MD;  Location: [facility];  Service: General;  Laterality: N/A;  . COLONOSCOPY  2008  . COLONOSCOPY WITH PROPOFOL N/A 07/21/2015   Procedure: COLONOSCOPY WITH PROPOFOL;  Surgeon: Lucilla Lame, MD;  Location: [place];  Service: Endoscopy;  Laterality: N/A;  . ESOPHAGOGASTRODUODENOSCOPY (EGD) WITH PROPOFOL N/A 06/16/2016   Procedure: ESOPHAGOGASTRODUODENOSCOPY (EGD) WITH PROPOFOL;  Surgeon: Christene Lye, MD;  Location: [facility];  Service: Endoscopy;  Laterality: N/A;  . HARDWARE REMOVAL Left 12/27/2016   Procedure: HARDWARE REMOVAL LEFT  PROXIMAL HUMEROUS;  Surgeon: Leim Fabry, MD;  Location: [facility];  Service: Orthopedics;  Laterality: Left;  . ORIF HUMERUS FRACTURE Left 11/23/2016   Procedure: OPEN REDUCTION  INTERNAL FIXATION (ORIF) PROXIMAL HUMERUS FRACTURE;  Surgeon: Leim Fabry, MD;  Location: [facility];  Service: Orthopedics;  Laterality: Left;  . REVERSE SHOULDER ARTHROPLASTY Left 12/27/2016   Procedure: REVERSE SHOULDER ARTHROPLASTY;  Surgeon: Leim Fabry, MD;  Location: [facility];  Service: Orthopedics;  Laterality: Left;    Prior to Admission medications   Medication Sig Start Date End Date Taking? Authorizing Provider  acetaminophen (TYLENOL) 500 MG tablet Take 500 mg every 6 (six) hours as needed by mouth for moderate  pain or headache.    [provider]  alendronate (FOSAMAX) 70 MG tablet TAKE ONE TABLET BY MOUTH EACH WEEK 06/07/17   Kathrine Haddock, NP  augmented betamethasone dipropionate (DIPROLENE AF) 0.05 % cream Apply topically 2 (two) times daily. 05/09/17   Kathrine Haddock, NP  busPIRone (BUSPAR) 10 MG tablet Take 1 tablet (10 mg total) by mouth 2 (two) times daily. 10/03/17   Rainey Pines, MD  Calcium Carbonate-Vitamin D (CALCIUM + D PO) Take 1 tablet daily by mouth.     [provider]  Cholecalciferol (VITAMIN D) 2000 units tablet Take 2,000 Units by mouth daily.    [provider]  dicyclomine (BENTYL) 10 MG capsule Take 1 capsule (10 mg total) by mouth 3 (three) times daily as needed for up to 14 days for spasms. or abdominal pain 10/18/17 11/01/17  Hinda Kehr, MD  docusate sodium (COLACE) 100 MG capsule Take 1 tablet once or twice daily as needed for constipation while taking narcotic pain medicine 10/18/17   Hinda Kehr, MD  Ferrous Gluconate (IRON 27 PO) Take 27 mg by mouth daily.    [provider]  gabapentin (NEURONTIN) 100 MG capsule Take by mouth. 08/16/17 08/16/18  [provider]  HYDROcodone-acetaminophen (NORCO/VICODIN) 5-325 MG tablet Take 1-2 tablets by mouth every 4 (four) hours as needed for moderate pain. 10/18/17   Hinda Kehr, MD  Multiple Vitamin (MULTIVITAMIN) tablet Take 1 tablet daily by mouth.     [provider]  omeprazole (PRILOSEC) 20 MG capsule Take 1 capsule (20 mg total) by mouth 2 (two) times daily before a meal. 07/27/17   Kathrine Haddock, NP  PARoxetine (PAXIL) 20 MG tablet Take 1 tablet (20 mg total) by mouth daily before supper. 10/03/17   Rainey Pines, MD  simvastatin (ZOCOR) 20 MG tablet Take 1 tablet (20 mg total) by mouth at bedtime. bedtime 01/12/17   Kathrine Haddock, NP  traMADol (ULTRAM) 50 MG tablet Take 1 - 2 tablets by mouth every 6 hours as needed for pain not to exceed 8 tablets a day 07/26/17   [provider]  traZODone (DESYREL) 100 MG tablet Take 1 tablet (100 mg total) by mouth at bedtime. 10/03/17   Rainey Pines, MD    Family History  Problem Relation Age of Onset  . Anxiety disorder Brother   . Obesity Brother   . COPD Brother   . Kidney disease Mother   . Heart attack Father   . Hypertension Father   . Alcohol abuse Father   . Depression Brother   . Anxiety disorder Brother   . Breast cancer Maternal Grandmother      Social History   Tobacco Use  . Smoking status: Current Every Day Smoker    Packs/day: 0.25    Years: 5.00    Pack years: 1.25    Types: Cigarettes    Start date: 11/25/2009    Last attempt to quit: 03/09/2016    Years since quitting: 1.6  .  Smokeless tobacco: Never Used  . Tobacco comment: Has tried patches and may go back on these again.  Says they work.   Substance Use Topics  . Alcohol use: No    Alcohol/week: 0.0 standard drinks    Comment: rarely  . Drug use: No    Allergies as of 10/19/2017  . (No Known Allergies)    Review of Systems:    All systems reviewed and negative except where noted in HPI.   Physical Exam:  LMP  (LMP Unknown)  No LMP recorded (lmp unknown). Patient is postmenopausal. Psych:  Alert and cooperative. Normal mood and affect. General:   Alert,  Well-developed, well-nourished, pleasant and cooperative in NAD Head:  Normocephalic and atraumatic. Eyes:  Sclera clear, no icterus.   Conjunctiva pink. Ears:  Normal auditory acuity. Nose:  No deformity, discharge, or lesions. Mouth:  No deformity or lesions,oropharynx pink & moist. Neck:  Supple; no masses or thyromegaly. Lungs:  Respirations even and unlabored.  Clear throughout to auscultation.   No wheezes, crackles, or rhonchi. No acute distress. Heart:  Regular rate and rhythm; no murmurs, clicks, rubs, or gallops. Abdomen:  Normal bowel sounds.  No bruits.  Soft, non-tender and non-distended without masses, hepatosplenomegaly or hernias noted.  No guarding  or rebound tenderness.    Msk:  Symmetrical without gross deformities. Good, equal movement & strength bilaterally. Pulses:  Normal pulses noted. Extremities:  No clubbing or edema.  No cyanosis. Neurologic:  Alert and oriented x3;  grossly normal neurologically. Skin:  Intact without significant lesions or rashes. No jaundice. Lymph Nodes:  No significant cervical adenopathy. Psych:  Alert and cooperative. Normal mood and affect.  Imaging Studies: Ct Abdomen Pelvis W Contrast  Result Date: 10/18/2017 CLINICAL DATA:  67 y/o  F; mid abdominal pain for 2-3 weeks. EXAM: CT ABDOMEN AND PELVIS WITH CONTRAST TECHNIQUE: Multidetector CT imaging of the abdomen and pelvis was performed using the standard protocol following bolus administration of intravenous contrast. CONTRAST:  100 cc Isovue-300 COMPARISON:  06/04/2016 MRI of the abdomen and pelvis. 07/09/2016 hepatobiliary scintigraphy. FINDINGS: Lower chest: Large hiatal hernia containing the stomach. Hepatobiliary: No focal liver lesion identified. Cholecystectomy. Mild intra and extrahepatic biliary ductal dilatation the common bile duct measuring up to 10 mm. No obstructing stone or mass identified. Pancreas: Unremarkable. No pancreatic ductal dilatation or surrounding inflammatory changes. Spleen: Normal in size without focal abnormality. Adrenals/Urinary Tract: Adrenal glands are unremarkable. Kidneys are normal, without renal calculi, focal lesion, or hydronephrosis. Bladder is unremarkable. Stomach/Bowel: Stomach is within normal limits. Appendix appears normal. No evidence of bowel wall thickening, distention, or inflammatory changes. Vascular/Lymphatic: Aortic atherosclerosis. No enlarged abdominal or pelvic lymph nodes. Reproductive: Uterus and bilateral adnexa are unremarkable. Other: No abdominal wall hernia or abnormality. No abdominopelvic ascites. Musculoskeletal: L4-5 and L5-S1 grade 1 anterolisthesis with prominent facet arthropathy. No acute  osseous abnormality identified. IMPRESSION: 1. Large hiatal hernia containing the stomach. 2. Mild intra and extrahepatic biliary ductal dilatation, probably compensatory post cholecystectomy, correlation with liver function test recommended. No obstructing stone or mass identified. 3. Lumbar spondylosis with grade 1 L4-5 and L5-S1 anterolisthesis. 4.  Aortic Atherosclerosis (ICD10-I70.0). Electronically Signed   By: Kristine Garbe M.D.   On: 10/18/2017 02:55    Assessment and Plan:   Chelsey [name] is a 67 y.o. y/o female has been referred for abdominal pain . She has a history of long term NSAID use, dilated CBD on CT scan and post prandial pain. Differentials are a gastric  ulcer from NSAID use +/- CBD obstruction +/- possible constipation   Plan  1. Stop ibuprofen - take omperazole daily 2. H pylori breath test  3. MRCP to evaluate CBD dilation  4. Suggest take Colace daily for possible constipation  5. IF no better at next visit and still losing weight will need endoscopy, imaging of her chest .  6. Stop smoking   Follow up in 4 weeks   Dr Jonathon Bellows MD,MRCP(U.K)

## 2017-10-21 LAB — URINE CULTURE
Culture: 10000 — AB
Special Requests: NORMAL

## 2017-10-21 LAB — H. PYLORI BREATH TEST: H PYLORI BREATH TEST: NEGATIVE

## 2017-10-23 ENCOUNTER — Encounter: Payer: Self-pay | Admitting: Gastroenterology

## 2017-10-28 ENCOUNTER — Ambulatory Visit: Payer: Federal, State, Local not specified - PPO

## 2017-10-28 MED ORDER — HYDROMORPHONE HCL 1 MG/ML IJ SOLN
0.50 | INTRAMUSCULAR | Status: DC
Start: ? — End: 2017-10-28

## 2017-10-28 MED ORDER — BUSPIRONE HCL 10 MG PO TABS
10.00 | ORAL_TABLET | ORAL | Status: DC
Start: 2017-10-28 — End: 2017-10-28

## 2017-10-28 MED ORDER — PAROXETINE HCL 20 MG PO TABS
20.00 | ORAL_TABLET | ORAL | Status: DC
Start: 2017-10-29 — End: 2017-10-28

## 2017-10-28 MED ORDER — SENNOSIDES-DOCUSATE SODIUM 8.6-50 MG PO TABS
1.00 | ORAL_TABLET | ORAL | Status: DC
Start: 2017-10-28 — End: 2017-10-28

## 2017-10-28 MED ORDER — BISACODYL 10 MG RE SUPP
10.00 | RECTAL | Status: DC
Start: ? — End: 2017-10-28

## 2017-10-28 MED ORDER — MELATONIN 3 MG PO TABS
3.00 | ORAL_TABLET | ORAL | Status: DC
Start: 2017-10-28 — End: 2017-10-28

## 2017-10-28 MED ORDER — GABAPENTIN 100 MG PO CAPS
100.00 | ORAL_CAPSULE | ORAL | Status: DC
Start: 2017-10-28 — End: 2017-10-28

## 2017-10-28 MED ORDER — SIMVASTATIN 20 MG PO TABS
20.00 | ORAL_TABLET | ORAL | Status: DC
Start: 2017-10-28 — End: 2017-10-28

## 2017-10-28 MED ORDER — POLYETHYLENE GLYCOL 3350 17 G PO PACK
1.00 | PACK | ORAL | Status: DC
Start: 2017-10-29 — End: 2017-10-28

## 2017-10-28 MED ORDER — ACETAMINOPHEN 325 MG PO TABS
325.00 | ORAL_TABLET | ORAL | Status: DC
Start: ? — End: 2017-10-28

## 2017-10-28 MED ORDER — ASPIRIN EC 81 MG PO TBEC
81.00 | DELAYED_RELEASE_TABLET | ORAL | Status: DC
Start: 2017-10-28 — End: 2017-10-28

## 2017-10-28 MED ORDER — OXYCODONE HCL 5 MG PO TABS
5.00 | ORAL_TABLET | ORAL | Status: DC
Start: ? — End: 2017-10-28

## 2017-10-28 MED ORDER — PANTOPRAZOLE SODIUM 40 MG PO TBEC
40.00 | DELAYED_RELEASE_TABLET | ORAL | Status: DC
Start: 2017-10-29 — End: 2017-10-28

## 2017-10-28 MED ORDER — MULTI-VITAMINS PO TABS
1.00 | ORAL_TABLET | ORAL | Status: DC
Start: 2017-10-29 — End: 2017-10-28

## 2017-10-28 MED ORDER — GENERIC EXTERNAL MEDICATION
Status: DC
Start: ? — End: 2017-10-28

## 2017-10-28 MED ORDER — TRAZODONE HCL 50 MG PO TABS
100.00 | ORAL_TABLET | ORAL | Status: DC
Start: 2017-10-28 — End: 2017-10-28

## 2017-10-28 MED ORDER — DIPHENHYDRAMINE HCL 25 MG PO CAPS
25.00 | ORAL_CAPSULE | ORAL | Status: DC
Start: ? — End: 2017-10-28

## 2017-10-28 MED ORDER — LIDOCAINE HCL 1 % IJ SOLN
0.50 | INTRAMUSCULAR | Status: DC
Start: ? — End: 2017-10-28

## 2017-11-01 ENCOUNTER — Ambulatory Visit: Payer: Federal, State, Local not specified - PPO

## 2017-11-29 ENCOUNTER — Ambulatory Visit: Payer: Self-pay | Admitting: Gastroenterology

## 2017-12-05 ENCOUNTER — Encounter: Payer: Self-pay | Admitting: Psychiatry

## 2017-12-05 ENCOUNTER — Ambulatory Visit: Payer: Federal, State, Local not specified - PPO | Admitting: Psychiatry

## 2017-12-05 ENCOUNTER — Other Ambulatory Visit: Payer: Self-pay

## 2017-12-05 VITALS — BP 97/69 | HR 96 | Temp 97.9°F | Wt 138.2 lb

## 2017-12-05 DIAGNOSIS — F331 Major depressive disorder, recurrent, moderate: Secondary | ICD-10-CM

## 2017-12-05 MED ORDER — TRAZODONE HCL 100 MG PO TABS: 100.0000 mg | ORAL_TABLET | Freq: Every day | ORAL | 2 refills | Status: DC

## 2017-12-05 MED ORDER — BUSPIRONE HCL 10 MG PO TABS: 10.0000 mg | ORAL_TABLET | Freq: Two times a day (BID) | ORAL | 1 refills | Status: DC

## 2017-12-05 MED ORDER — PAROXETINE HCL 20 MG PO TABS: 20.0000 mg | ORAL_TABLET | Freq: Every day | ORAL | 1 refills | Status: DC

## 2017-12-05 NOTE — Progress Notes (Signed)
[facility] MD/PA/NP OP Progress Note  12/05/2017 11:49 AM Chelsey [name]  MRN:  [id number]  Subjective:  Patient is a 67 year old female who presented for the follow-up appointment. She is currently 6 weeks  left shoulder post revision arthroplasty to reverse shoulder arthroplasty at the [facility].  Patient has lost significant amount of weight since her last visit.  She reported that she has an appointment tomorrow as they will reevaluate her shoulder.  She was wearing a sling and reported that her sister comes to help her mobilize her shoulder little bit at a time.  She does not have any physical therapy and does not take any pain medications on a regular basis.  Patient reported that her pain has improved.  She stays at home and is not allowed to do any physical activity with her shoulder.  Patient reported that her son also helps her but he continues to drink on the weekends.  She is feeling stable at this time.  She stated that she has been compliant with her medications.  She reported that she is having a difficult time sleeping at night as she is not tired and is just sitting at home and watching TV.  Patient reported that she is looking forward to improve especially with her shoulder and is positive about the same.  She denied having any suicidal homicidal ideations or plans.  She denied having any perceptual disturbances.           Chief Complaint:  Chief Complaint    Follow-up; Medication Refill     Visit Diagnosis:     ICD-10-CM   1. MDD (major depressive disorder), recurrent episode, moderate ([place]) F33.1     Past Medical History:  Past Medical History:  Diagnosis Date  . Breast cancer ([place]) 1997   right breast, radiation  . Bronchitis    recent/ 06/09/15 [facility]/resolved  . Cancer [facility]) 1997   right lumpectomy,L/Ad/R   . GERD (gastroesophageal reflux disease)   . Hypercholesteremia   . Hyperlipidemia   . Panic attack   . Personal history of  malignant neoplasm of breast     Past Surgical History:  Procedure Laterality Date  . BICEPT TENODESIS Left 11/23/2016   Procedure: BICEPS TENODESIS;  Surgeon: Leim Fabry, MD;  Location: [facility];  Service: Orthopedics;  Laterality: Left;  . BREAST EXCISIONAL BIOPSY Right 1997   pos  . BREAST SURGERY Right 1997   lumpectomy  . CHOLECYSTECTOMY N/A 08/06/2016   Procedure: LAPAROSCOPIC CHOLECYSTECTOMY WITH INTRAOPERATIVE CHOLANGIOGRAM;  Surgeon: Christene Lye, MD;  Location: [facility];  Service: General;  Laterality: N/A;  . COLONOSCOPY  2008  . COLONOSCOPY WITH PROPOFOL N/A 07/21/2015   Procedure: COLONOSCOPY WITH PROPOFOL;  Surgeon: Lucilla Lame, MD;  Location: [place];  Service: Endoscopy;  Laterality: N/A;  . ESOPHAGOGASTRODUODENOSCOPY (EGD) WITH PROPOFOL N/A 06/16/2016   Procedure: ESOPHAGOGASTRODUODENOSCOPY (EGD) WITH PROPOFOL;  Surgeon: Christene Lye, MD;  Location: [facility];  Service: Endoscopy;  Laterality: N/A;  . HARDWARE REMOVAL Left 12/27/2016   Procedure: HARDWARE REMOVAL LEFT  PROXIMAL HUMEROUS;  Surgeon: Leim Fabry, MD;  Location: [facility];  Service: Orthopedics;  Laterality: Left;  . ORIF HUMERUS FRACTURE Left 11/23/2016   Procedure: OPEN REDUCTION INTERNAL FIXATION (ORIF) PROXIMAL HUMERUS FRACTURE;  Surgeon: Leim Fabry, MD;  Location: [facility];  Service: Orthopedics;  Laterality: Left;  . REVERSE SHOULDER ARTHROPLASTY Left 12/27/2016   Procedure: REVERSE SHOULDER ARTHROPLASTY;  Surgeon: Leim Fabry, MD;  Location: [facility]  ORS;  Service: Orthopedics;  Laterality: Left;   Family History:  Family History  Problem Relation Age of Onset  . Anxiety disorder Brother   . Obesity Brother   . COPD Brother   . Kidney disease Mother   . Heart attack Father   . Hypertension Father   . Alcohol abuse Father   . Depression Brother   . Anxiety disorder Brother   . Breast cancer Maternal Grandmother    Social History:  Social History    Socioeconomic History  . Marital status: Widowed    Spouse name: Not on file  . Number of children: 2  . Years of education: Not on file  . Highest education level: Bachelor's degree (e.g., BA, AB, BS)  Occupational History  . Not on file  Social Needs  . Financial resource strain: Hard  . Food insecurity:    Worry: Never true    Inability: Never true  . Transportation needs:    Medical: No    Non-medical: No  Tobacco Use  . Smoking status: Current Every Day Smoker    Packs/day: 0.25    Years: 5.00    Pack years: 1.25    Types: Cigarettes    Start date: 11/25/2009    Last attempt to quit: 03/09/2016    Years since quitting: 1.7  . Smokeless tobacco: Never Used  . Tobacco comment: Has tried patches and may go back on these again.  Says they work.   Substance and Sexual Activity  . Alcohol use: No    Alcohol/week: 0.0 standard drinks    Comment: rarely  . Drug use: No  . Sexual activity: Never  Lifestyle  . Physical activity:    Days per week: 0 days    Minutes per session: 0 min  . Stress: Very much  Relationships  . Social connections:    Talks on phone: Not on file    Gets together: Not on file    Attends religious service: Never    Active member of club or organization: No    Attends meetings of clubs or organizations: Never    Relationship status: Widowed  Other Topics Concern  . Not on file  Social History Narrative  . Not on file   Additional History:  Currently her son is living with her and patient is trying to help him move out into an apartment. 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  Assessment:   Musculoskeletal: Strength & Muscle Tone: within normal limits Gait & Station: normal Patient leans: N/A  Psychiatric Specialty Exam: Medication Refill     Review of Systems  Psychiatric/Behavioral: Positive for depression. The patient is nervous/anxious.     Blood pressure 97/69, pulse 96, temperature 97.9 F (36.6 C),  temperature source Oral, weight 138 lb 3.2 oz (62.7 kg).Body mass index is 24.48 kg/m.  General Appearance: Casual  Eye Contact:  Good  Speech:  Clear and Coherent  Volume:  Normal  Mood:  Anxious, Depressed and Dysphoric  Affect:  Congruent  Thought Process:  Coherent  Orientation:  Full (Time, Place, and Person)  Thought Content:  WDL  Suicidal Thoughts:  No  Homicidal Thoughts:  No  Memory:  Immediate;   Fair  Judgement:  Fair  Insight:  Fair  Psychomotor Activity:  Decreased  Concentration:  Fair  Recall:  [employer] of Knowledge: Fair  Language: Fair  Akathisia:  No  Handed:  Right  AIMS (if indicated):  Assets:  Communication Skills Desire for Improvement Social Support  ADL's:  Intact  Cognition: WNL  Sleep: 6- 7 with trazodone    Is the patient at risk to self?  No. Has the patient been a risk to self in the past 6 months?  No. Has the patient been a risk to self within the distant past?  No. Is the patient a risk to others?  No. Has the patient been a risk to others in the past 6 months?  No. Has the patient been a risk to others within the distant past?  No.  Current Medications: Current Outpatient Medications  Medication Sig Dispense Refill  . acetaminophen (TYLENOL) 500 MG tablet Take 500 mg every 6 (six) hours as needed by mouth for moderate pain or headache.    . alendronate (FOSAMAX) 70 MG tablet TAKE ONE TABLET BY MOUTH EACH WEEK 12 tablet 3  . augmented betamethasone dipropionate (DIPROLENE AF) 0.05 % cream Apply topically 2 (two) times daily. 30 g 0  . busPIRone (BUSPAR) 10 MG tablet Take 1 tablet (10 mg total) by mouth 2 (two) times daily. 180 tablet 1  . Calcium Carbonate-Vitamin D (CALCIUM + D PO) Take 1 tablet daily by mouth.     . Cholecalciferol (VITAMIN D) 2000 units tablet Take 2,000 Units by mouth daily.    Marland Kitchen docusate sodium (COLACE) 100 MG capsule Take 1 tablet once or twice daily as needed for constipation while taking narcotic pain  medicine 30 capsule 0  . Ferrous Gluconate (IRON 27 PO) Take 27 mg by mouth daily.    Marland Kitchen gabapentin (NEURONTIN) 100 MG capsule Take by mouth.    Marland Kitchen HYDROcodone-acetaminophen (NORCO/VICODIN) 5-325 MG tablet Take 1-2 tablets by mouth every 4 (four) hours as needed for moderate pain. 15 tablet 0  . Multiple Vitamin (MULTIVITAMIN) tablet Take 1 tablet daily by mouth.     Marland Kitchen omeprazole (PRILOSEC) 20 MG capsule Take 1 capsule (20 mg total) by mouth 2 (two) times daily before a meal. 180 capsule 3  . PARoxetine (PAXIL) 20 MG tablet Take 1 tablet (20 mg total) by mouth daily before supper. 90 tablet 1  . simvastatin (ZOCOR) 20 MG tablet Take 1 tablet (20 mg total) by mouth at bedtime. bedtime 90 tablet 3  . traMADol (ULTRAM) 50 MG tablet Take 1 - 2 tablets by mouth every 6 hours as needed for pain not to exceed 8 tablets a day    . traZODone (DESYREL) 100 MG tablet Take 1 tablet (100 mg total) by mouth at bedtime. 90 tablet 2  . dicyclomine (BENTYL) 10 MG capsule Take 1 capsule (10 mg total) by mouth 3 (three) times daily as needed for up to 14 days for spasms. or abdominal pain 30 capsule 0   No current facility-administered medications for this visit.     Medical Decision Making:  Review of Psycho-Social Stressors (1) and Review and summation of old records (2)  Treatment Plan Summary:Medication management   Continue medications as follows    Paxil 20 mg by mouth daily at 3pm  Continue trazodone 100 mg at bedtime. Continue BuSpar 10 mg by mouth twice a day      More than 50% of the time spent in psychoeducation, counseling and coordination of care.   Follow-up in 2 months or earlier depending on her symptoms  This note was generated in part or whole with voice recognition software. Voice regonition is usually quite accurate but there are transcription errors that can and  very often do occur. I apologize for any typographical errors that were not detected and corrected.    Rainey Pines, MD     12/05/2017, 11:49 AM

## 2017-12-16 ENCOUNTER — Other Ambulatory Visit: Payer: Self-pay | Admitting: Unknown Physician Specialty

## 2017-12-29 ENCOUNTER — Ambulatory Visit: Payer: Federal, State, Local not specified - PPO | Admitting: Gastroenterology

## 2017-12-29 ENCOUNTER — Encounter: Payer: Self-pay | Admitting: Gastroenterology

## 2017-12-29 ENCOUNTER — Other Ambulatory Visit: Payer: Self-pay

## 2017-12-29 VITALS — BP 105/73 | HR 111 | Ht 63.0 in | Wt 134.8 lb

## 2017-12-29 DIAGNOSIS — R9389 Abnormal findings on diagnostic imaging of other specified body structures: Secondary | ICD-10-CM | POA: Diagnosis not present

## 2017-12-29 DIAGNOSIS — R140 Abdominal distension (gaseous): Secondary | ICD-10-CM

## 2017-12-29 DIAGNOSIS — K5900 Constipation, unspecified: Secondary | ICD-10-CM | POA: Diagnosis not present

## 2017-12-29 DIAGNOSIS — R1013 Epigastric pain: Secondary | ICD-10-CM

## 2017-12-29 NOTE — Progress Notes (Signed)
Jonathon Bellows MD, MRCP(U.K) [address]  [place]  [place],  [address]  Main: [phone]  Fax: [phone]   Primary Care Physician: Guadalupe Maple, MD  Primary Gastroenterologist:  Dr. Jonathon Bellows   Chief Complaint  Patient presents with  . Follow-up    Abdominal pain, constipation    HPI: Chelsey [name] is a 67 y.o. female  Summary of history :  She is here today to see me for a follow-up.  She was initially seen on 10/19/2017 when she was referred for abdominal pain.  The pain has been going on for weeks and had been gradually getting worse.  Occurs every time she ate.  Almost immediately.  Had a lot of burping.  Felt like a squeeze.  Better when she stood up.  No clear relieving factors.  Had been on ibuprofen few times a week for shoulder pain.  There was some concern for constipation.  Status post cholecystectomy.  She had a CT scan of the abdomen and it demonstrated mild intra and extra hepatic biliary dilation. No stones seen. Large hiatal hernia. CBD 10 mm and s/p cholecystectomy. LFT were normal except an isolated elevated T bil of 1.3 .  Interval history 10/19/2017 to 12/29/2017   Previously ordered MRCP was not performed. 10/19/2017: H. pylori breath test negative.  Hemoglobin and CMP normal in 10/14/2017  Stopped Alleve. Not on any NSAID's. All day since from the time she wakes up to the time she sleeps has a lot of gas and bloating, some pain , lof of burping. Has a bowel movement is very sticky - once a day , only way she can get it out is with manual dysimpaction.       Current Outpatient Medications  Medication Sig Dispense Refill  . acetaminophen (TYLENOL) 500 MG tablet Take 500 mg every 6 (six) hours as needed by mouth for moderate pain or headache.    . busPIRone (BUSPAR) 10 MG tablet Take 1 tablet (10 mg total) by mouth 2 (two) times daily. 180 tablet 1  . Calcium Carbonate-Vitamin D (CALCIUM + D PO) Take 1 tablet daily by mouth.     .  Cholecalciferol (VITAMIN D) 2000 units tablet Take 2,000 Units by mouth daily.    Marland Kitchen docusate sodium (COLACE) 100 MG capsule Take 1 tablet once or twice daily as needed for constipation while taking narcotic pain medicine 30 capsule 0  . Ferrous Gluconate (IRON 27 PO) Take 27 mg by mouth daily.    . Multiple Vitamin (MULTIVITAMIN) tablet Take 1 tablet daily by mouth.     Marland Kitchen omeprazole (PRILOSEC) 20 MG capsule Take 1 capsule (20 mg total) by mouth 2 (two) times daily before a meal. 180 capsule 3  . PARoxetine (PAXIL) 20 MG tablet Take 1 tablet (20 mg total) by mouth daily before supper. 90 tablet 1  . simvastatin (ZOCOR) 20 MG tablet TAKE ONE TABLET BY MOUTH AT BEDTIME 90 tablet 1  . traZODone (DESYREL) 100 MG tablet Take 1 tablet (100 mg total) by mouth at bedtime. 90 tablet 2  . alendronate (FOSAMAX) 70 MG tablet TAKE ONE TABLET BY MOUTH EACH WEEK (Patient not taking: Reported on 12/29/2017) 12 tablet 3  . augmented betamethasone dipropionate (DIPROLENE AF) 0.05 % cream Apply topically 2 (two) times daily. (Patient not taking: Reported on 12/29/2017) 30 g 0  . dicyclomine (BENTYL) 10 MG capsule Take 1 capsule (10 mg total) by mouth 3 (three) times daily as needed for up  to 14 days for spasms. or abdominal pain 30 capsule 0  . gabapentin (NEURONTIN) 100 MG capsule Take by mouth.    Marland Kitchen HYDROcodone-acetaminophen (NORCO/VICODIN) 5-325 MG tablet Take 1-2 tablets by mouth every 4 (four) hours as needed for moderate pain. (Patient not taking: Reported on 12/29/2017) 15 tablet 0  . traMADol (ULTRAM) 50 MG tablet Take 1 - 2 tablets by mouth every 6 hours as needed for pain not to exceed 8 tablets a day     No current facility-administered medications for this visit.     Allergies as of 12/29/2017  . (No Known Allergies)    ROS:  General: Negative for anorexia, weight loss, fever, chills, fatigue, weakness. ENT: Negative for hoarseness, difficulty swallowing , nasal congestion. CV: Negative for chest  pain, angina, palpitations, dyspnea on exertion, peripheral edema.  Respiratory: Negative for dyspnea at rest, dyspnea on exertion, cough, sputum, wheezing.  GI: See history of present illness. GU:  Negative for dysuria, hematuria, urinary incontinence, urinary frequency, nocturnal urination.  Endo: Negative for unusual weight change.    Physical Examination:   BP 105/73   Pulse (!) 111   Ht 5\' 3"  (1.6 m)   Wt 134 lb 12.8 oz (61.1 kg)   LMP  (LMP Unknown)   BMI 23.88 kg/m   General: Well-nourished, well-developed in no acute distress.  Eyes: No icterus. Conjunctivae pink. Mouth: Oropharyngeal mucosa moist and pink , no lesions erythema or exudate. Lungs: Clear to auscultation bilaterally. Non-labored. Heart: Regular rate and rhythm, no murmurs rubs or gallops.  Abdomen: Bowel sounds are normal, nontender, nondistended, no hepatosplenomegaly or masses, no abdominal bruits or hernia , no rebound or guarding.   Extremities: No lower extremity edema. No clubbing or deformities. Neuro: Alert and oriented x 3.  Grossly intact. Skin: Warm and dry, no jaundice.   Psych: Alert and cooperative, normal mood and affect.   Imaging Studies: No results found.  Assessment and Plan:   Chelsey [name] is a 67 y.o. y/o female for abdominal pain, gas and bloating  . She has a history of dilated CBD on CT scan and post prandial pain. Did not obtain MRCP after last visit. Appears her symptoms are from a combination of constipation, gas and bloating. Did explain she needs to stop smoking . Due to persistence of symptoms will evaluate further   Plan  1. Stop probiotics  2.  MRCP to evaluate CBD dilation  4. Trial of Linzess 142 mcg once daily- samples provided  5. EGD+colonoscopy  6. Trial of activated charcoal tablets.  7.  Stop smoking  8. If no better need to treat for SIBO  I have discussed alternative options, risks & benefits,  which include, but are not limited to, bleeding, infection,  perforation,respiratory complication & drug reaction.  The patient agrees with this plan & written consent will be obtained.       Dr Jonathon Bellows  MD,MRCP [facility]) Follow up in 6 weeks

## 2017-12-30 ENCOUNTER — Telehealth: Payer: Self-pay | Admitting: Gastroenterology

## 2017-12-30 NOTE — Telephone Encounter (Signed)
Spoke with pt regarding her question about how to take the [place]. I have reminded pt of the directions.

## 2017-12-30 NOTE — Telephone Encounter (Signed)
Pt left vm he saw Dr. Marlyn Corporal and was given [place] and needs direction on how to take it and how many please call pt

## 2018-01-02 ENCOUNTER — Telehealth: Payer: Self-pay

## 2018-01-05 ENCOUNTER — Ambulatory Visit: Payer: Federal, State, Local not specified - PPO | Admitting: Anesthesiology

## 2018-01-05 ENCOUNTER — Telehealth: Payer: Self-pay

## 2018-01-05 ENCOUNTER — Ambulatory Visit
Admission: RE | Admit: 2018-01-05 | Discharge: 2018-01-05 | Disposition: A | Discharge status: Home / Self Care | Payer: Federal, State, Local not specified - PPO | Attending: Gastroenterology | Admitting: Gastroenterology

## 2018-01-05 ENCOUNTER — Encounter: Payer: Self-pay | Admitting: *Deleted

## 2018-01-05 ENCOUNTER — Encounter
Admission: RE | Disposition: A | Discharge status: Home / Self Care | Payer: Self-pay | Source: Home / Self Care | Attending: Gastroenterology

## 2018-01-05 DIAGNOSIS — Z7983 Long term (current) use of bisphosphonates: Secondary | ICD-10-CM | POA: Diagnosis not present

## 2018-01-05 DIAGNOSIS — F329 Major depressive disorder, single episode, unspecified: Secondary | ICD-10-CM | POA: Diagnosis not present

## 2018-01-05 DIAGNOSIS — R1013 Epigastric pain: Secondary | ICD-10-CM

## 2018-01-05 DIAGNOSIS — K640 First degree hemorrhoids: Secondary | ICD-10-CM | POA: Insufficient documentation

## 2018-01-05 DIAGNOSIS — D122 Benign neoplasm of ascending colon: Secondary | ICD-10-CM | POA: Diagnosis not present

## 2018-01-05 DIAGNOSIS — F419 Anxiety disorder, unspecified: Secondary | ICD-10-CM | POA: Diagnosis not present

## 2018-01-05 DIAGNOSIS — Z923 Personal history of irradiation: Secondary | ICD-10-CM | POA: Diagnosis not present

## 2018-01-05 DIAGNOSIS — R109 Unspecified abdominal pain: Secondary | ICD-10-CM | POA: Diagnosis present

## 2018-01-05 DIAGNOSIS — K219 Gastro-esophageal reflux disease without esophagitis: Secondary | ICD-10-CM | POA: Insufficient documentation

## 2018-01-05 DIAGNOSIS — Z853 Personal history of malignant neoplasm of breast: Secondary | ICD-10-CM | POA: Diagnosis not present

## 2018-01-05 DIAGNOSIS — E785 Hyperlipidemia, unspecified: Secondary | ICD-10-CM | POA: Insufficient documentation

## 2018-01-05 DIAGNOSIS — F17210 Nicotine dependence, cigarettes, uncomplicated: Secondary | ICD-10-CM | POA: Diagnosis not present

## 2018-01-05 DIAGNOSIS — R140 Abdominal distension (gaseous): Secondary | ICD-10-CM

## 2018-01-05 DIAGNOSIS — E7800 Pure hypercholesterolemia, unspecified: Secondary | ICD-10-CM | POA: Insufficient documentation

## 2018-01-05 DIAGNOSIS — D649 Anemia, unspecified: Secondary | ICD-10-CM | POA: Insufficient documentation

## 2018-01-05 DIAGNOSIS — K635 Polyp of colon: Secondary | ICD-10-CM | POA: Diagnosis not present

## 2018-01-05 DIAGNOSIS — Z79899 Other long term (current) drug therapy: Secondary | ICD-10-CM | POA: Diagnosis not present

## 2018-01-05 DIAGNOSIS — K5900 Constipation, unspecified: Secondary | ICD-10-CM

## 2018-01-05 HISTORY — PX: ESOPHAGOGASTRODUODENOSCOPY (EGD) WITH PROPOFOL: SHX5813

## 2018-01-05 HISTORY — PX: COLONOSCOPY WITH PROPOFOL: SHX5780

## 2018-01-05 SURGERY — COLONOSCOPY WITH PROPOFOL
Anesthesia: General

## 2018-01-05 MED ORDER — FENTANYL CITRATE (PF) 100 MCG/2ML IJ SOLN
INTRAMUSCULAR | Status: AC
  Filled 2018-01-05: qty 2

## 2018-01-05 MED ORDER — MIDAZOLAM HCL 2 MG/2ML IJ SOLN
INTRAMUSCULAR | Status: AC
  Filled 2018-01-05: qty 2

## 2018-01-05 MED ORDER — FENTANYL CITRATE (PF) 100 MCG/2ML IJ SOLN
INTRAMUSCULAR | Status: DC | PRN
  Administered 2018-01-05 (×4): 25 ug via INTRAVENOUS

## 2018-01-05 MED ORDER — MIDAZOLAM HCL 2 MG/2ML IJ SOLN
INTRAMUSCULAR | Status: DC | PRN
  Administered 2018-01-05 (×2): 1 mg via INTRAVENOUS

## 2018-01-05 MED ORDER — PHENYLEPHRINE HCL 10 MG/ML IJ SOLN
INTRAMUSCULAR | Status: DC | PRN
  Administered 2018-01-05 (×8): 100 ug via INTRAVENOUS

## 2018-01-05 MED ORDER — PROPOFOL 500 MG/50ML IV EMUL
INTRAVENOUS | Status: DC | PRN
  Administered 2018-01-05: 85 ug/kg/min via INTRAVENOUS

## 2018-01-05 MED ORDER — PROPOFOL 500 MG/50ML IV EMUL
INTRAVENOUS | Status: AC
  Filled 2018-01-05: qty 50

## 2018-01-05 MED ORDER — PROPOFOL 10 MG/ML IV BOLUS
INTRAVENOUS | Status: DC | PRN
  Administered 2018-01-05: 60 mg via INTRAVENOUS

## 2018-01-05 MED ORDER — ONDANSETRON HCL 4 MG/2ML IJ SOLN
INTRAMUSCULAR | Status: DC | PRN
  Administered 2018-01-05: 4 mg via INTRAVENOUS

## 2018-01-05 MED ORDER — SODIUM CHLORIDE 0.9 % IV SOLN
INTRAVENOUS | Status: DC
  Administered 2018-01-05: 09:00:00 via INTRAVENOUS

## 2018-01-05 NOTE — Transfer of Care (Signed)
Immediate Anesthesia Transfer of Care Note  Patient: Chelsey [name]  Procedure(s) Performed: COLONOSCOPY WITH PROPOFOL (N/A ) ESOPHAGOGASTRODUODENOSCOPY (EGD) WITH PROPOFOL (N/A )  Patient Location: PACU  Anesthesia Type:General  Level of Consciousness: awake, alert  and oriented  Airway & Oxygen Therapy: Patient Spontanous Breathing and Patient connected to nasal cannula oxygen  Post-op Assessment: Report given to RN, Post -op Vital signs reviewed and stable and Patient moving all extremities  Post vital signs: Reviewed and stable  Last Vitals:  Vitals Value Taken Time  BP 90/66 01/05/2018 10:06 AM  Temp    Pulse 83 01/05/2018 10:06 AM  Resp 12 01/05/2018 10:06 AM  SpO2 99 % 01/05/2018 10:06 AM  Vitals shown include unvalidated device data.  Last Pain:  Vitals:   01/05/18 1006  TempSrc: (P) Tympanic         Complications: No apparent anesthesia complications

## 2018-01-05 NOTE — Telephone Encounter (Signed)
ERROR

## 2018-01-05 NOTE — Telephone Encounter (Signed)
Spoke with pt and informed her of MRI (MRCP) appointment date, time, location and prior prep.

## 2018-01-05 NOTE — Anesthesia Postprocedure Evaluation (Signed)
Anesthesia Post Note  Patient: Chelsey [name]  Procedure(s) Performed: COLONOSCOPY WITH PROPOFOL (N/A ) ESOPHAGOGASTRODUODENOSCOPY (EGD) WITH PROPOFOL (N/A )  Patient location during evaluation: Endoscopy Anesthesia Type: General Level of consciousness: awake and alert and oriented Pain management: pain level controlled Vital Signs Assessment: post-procedure vital signs reviewed and stable Respiratory status: spontaneous breathing, nonlabored ventilation and respiratory function stable Cardiovascular status: blood pressure returned to baseline and stable Postop Assessment: no signs of nausea or vomiting Anesthetic complications: no     Last Vitals:  Vitals:   01/05/18 1006 01/05/18 1016  BP: 90/66 103/71  Pulse: 75 79  Resp: 15 12  Temp: (!) 36.2 C   SpO2: 99% 100%    Last Pain:  Vitals:   01/05/18 1016  TempSrc:   PainSc: 0-No pain                 Piedad Standiford

## 2018-01-05 NOTE — H&P (Signed)
Jonathon Bellows, MD [address] [place], [place], [place], [address] [address], [place], [place], [place], [address] Phone: [phone]  Fax: [phone]  Primary Care Physician:  Guadalupe Maple, MD   Pre-Procedure History & Physical: HPI:  Chelsey [name] is a 67 y.o. female is here for an endoscopy and colonoscopy    Past Medical History:  Diagnosis Date  . Breast cancer ([place]) 1997   right breast, radiation  . Bronchitis    recent/ 06/09/15 [facility] x[facility]/resolved  . Cancer [facility]) 1997   right lumpectomy,L/Ad/R   . GERD (gastroesophageal reflux disease)   . Hypercholesteremia   . Hyperlipidemia   . Panic attack   . Personal history of malignant neoplasm of breast     Past Surgical History:  Procedure Laterality Date  . BICEPT TENODESIS Left 11/23/2016   Procedure: BICEPS TENODESIS;  Surgeon: Leim Fabry, MD;  Location: [facility];  Service: Orthopedics;  Laterality: Left;  . BREAST EXCISIONAL BIOPSY Right 1997   pos  . BREAST SURGERY Right 1997   lumpectomy  . CHOLECYSTECTOMY N/A 08/06/2016   Procedure: LAPAROSCOPIC CHOLECYSTECTOMY WITH INTRAOPERATIVE CHOLANGIOGRAM;  Surgeon: Christene Lye, MD;  Location: [facility];  Service: General;  Laterality: N/A;  . COLONOSCOPY  2008  . COLONOSCOPY WITH PROPOFOL N/A 07/21/2015   Procedure: COLONOSCOPY WITH PROPOFOL;  Surgeon: Lucilla Lame, MD;  Location: [facility];  Service: Endoscopy;  Laterality: N/A;  . ESOPHAGOGASTRODUODENOSCOPY (EGD) WITH PROPOFOL N/A 06/16/2016   Procedure: ESOPHAGOGASTRODUODENOSCOPY (EGD) WITH PROPOFOL;  Surgeon: Christene Lye, MD;  Location: [facility];  Service: Endoscopy;  Laterality: N/A;  . HARDWARE REMOVAL Left 12/27/2016   Procedure: HARDWARE REMOVAL LEFT  PROXIMAL HUMEROUS;  Surgeon: Leim Fabry, MD;  Location: [facility];  Service: Orthopedics;  Laterality: Left;  . ORIF HUMERUS FRACTURE Left 11/23/2016   Procedure: OPEN REDUCTION INTERNAL  FIXATION (ORIF) PROXIMAL HUMERUS FRACTURE;  Surgeon: Leim Fabry, MD;  Location: [facility];  Service: Orthopedics;  Laterality: Left;  . REVERSE SHOULDER ARTHROPLASTY Left 12/27/2016   Procedure: REVERSE SHOULDER ARTHROPLASTY;  Surgeon: Leim Fabry, MD;  Location: [facility];  Service: Orthopedics;  Laterality: Left;    Prior to Admission medications   Medication Sig Start Date End Date Taking? Authorizing Provider  Ferrous Gluconate (IRON 27 PO) Take 27 mg by mouth daily.   Yes [provider]  Multiple Vitamin (MULTIVITAMIN) tablet Take 1 tablet daily by mouth.    Yes [provider]  omeprazole (PRILOSEC) 20 MG capsule Take 1 capsule (20 mg total) by mouth 2 (two) times daily before a meal. 07/27/17  Yes Kathrine Haddock, NP  simvastatin (ZOCOR) 20 MG tablet TAKE ONE TABLET BY MOUTH AT BEDTIME 12/16/17  Yes Kathrine Haddock, NP  traZODone (DESYREL) 100 MG tablet Take 1 tablet (100 mg total) by mouth at bedtime. 12/05/17  Yes Rainey Pines, MD  acetaminophen (TYLENOL) 500 MG tablet Take 500 mg every 6 (six) hours as needed by mouth for moderate pain or headache.    [provider]  alendronate (FOSAMAX) 70 MG tablet TAKE ONE TABLET BY MOUTH EACH WEEK Patient not taking: Reported on 12/29/2017 06/07/17   Kathrine Haddock, NP  augmented betamethasone dipropionate (DIPROLENE AF) 0.05 % cream Apply topically 2 (two) times daily. Patient not taking: Reported on 12/29/2017 05/09/17   Kathrine Haddock, NP  busPIRone (BUSPAR) 10 MG tablet Take 1 tablet (10 mg total) by mouth 2 (two) times daily. 12/05/17   Rainey Pines,  MD  Calcium Carbonate-Vitamin D (CALCIUM + D PO) Take 1 tablet daily by mouth.     [provider]  Cholecalciferol (VITAMIN D) 2000 units tablet Take 2,000 Units by mouth daily.    [provider]  dicyclomine (BENTYL) 10 MG capsule Take 1 capsule (10 mg total) by mouth 3 (three) times daily as needed for up to 14 days for spasms. or abdominal pain 10/18/17  11/01/17  Hinda Kehr, MD  docusate sodium (COLACE) 100 MG capsule Take 1 tablet once or twice daily as needed for constipation while taking narcotic pain medicine 10/18/17   Hinda Kehr, MD  gabapentin (NEURONTIN) 100 MG capsule Take by mouth. 08/16/17 08/16/18  [provider]  HYDROcodone-acetaminophen (NORCO/VICODIN) 5-325 MG tablet Take 1-2 tablets by mouth every 4 (four) hours as needed for moderate pain. Patient not taking: Reported on 12/29/2017 10/18/17   Hinda Kehr, MD  PARoxetine (PAXIL) 20 MG tablet Take 1 tablet (20 mg total) by mouth daily before supper. 12/05/17   Rainey Pines, MD  traMADol (ULTRAM) 50 MG tablet Take 1 - 2 tablets by mouth every 6 hours as needed for pain not to exceed 8 tablets a day 07/26/17   [provider]    Allergies as of 12/29/2017  . (No Known Allergies)    Family History  Problem Relation Age of Onset  . Anxiety disorder Brother   . Obesity Brother   . COPD Brother   . Kidney disease Mother   . Heart attack Father   . Hypertension Father   . Alcohol abuse Father   . Depression Brother   . Anxiety disorder Brother   . Breast cancer Maternal Grandmother     Social History   Socioeconomic History  . Marital status: Widowed    Spouse name: Not on file  . Number of children: 2  . Years of education: Not on file  . Highest education level: Bachelor's degree (e.g., BA, AB, BS)  Occupational History  . Not on file  Social Needs  . Financial resource strain: Hard  . Food insecurity:    Worry: Never true    Inability: Never true  . Transportation needs:    Medical: No    Non-medical: No  Tobacco Use  . Smoking status: Current Every Day Smoker    Packs/day: 0.25    Years: 5.00    Pack years: 1.25    Types: Cigarettes    Start date: 11/25/2009    Last attempt to quit: 03/09/2016    Years since quitting: 1.8  . Smokeless tobacco: Never Used  . Tobacco comment: Has tried patches and may go back on these again.  Says  they work.   Substance and Sexual Activity  . Alcohol use: No    Alcohol/week: 0.0 standard drinks    Comment: rarely  . Drug use: No  . Sexual activity: Never  Lifestyle  . Physical activity:    Days per week: 0 days    Minutes per session: 0 min  . Stress: Very much  Relationships  . Social connections:    Talks on phone: Not on file    Gets together: Not on file    Attends religious service: Never    Active member of club or organization: No    Attends meetings of clubs or organizations: Never    Relationship status: Widowed  . Intimate partner violence:    Fear of current or ex partner: No    Emotionally abused: No  Physically abused: No    Forced sexual activity: No  Other Topics Concern  . Not on file  Social History Narrative  . Not on file    Review of Systems: See HPI, otherwise negative ROS  Physical Exam: BP 93/72   Pulse 80   Temp 97.8 F (36.6 C) (Tympanic)   Resp 16   Ht 5\' 3"  (1.6 m)   Wt 60.8 kg   LMP  (LMP Unknown)   SpO2 100%   BMI 23.74 kg/m  General:   Alert,  pleasant and cooperative in NAD Head:  Normocephalic and atraumatic. Neck:  Supple; no masses or thyromegaly. Lungs:  Clear throughout to auscultation, normal respiratory effort.    Heart:  +S1, +S2, Regular rate and rhythm, No edema. Abdomen:  Soft, nontender and nondistended. Normal bowel sounds, without guarding, and without rebound.   Neurologic:  Alert and  oriented x4;  grossly normal neurologically.  Impression/Plan: Chelsey [name] is here for an endoscopy and colonoscopy  to be performed for  evaluation of abdominal pain    Risks, benefits, limitations, and alternatives regarding endoscopy have been reviewed with the patient.  Questions have been answered.  All parties agreeable.   Jonathon Bellows, MD  01/05/2018, 9:21 AM

## 2018-01-05 NOTE — Anesthesia Post-op Follow-up Note (Signed)
Anesthesia QCDR form completed.        

## 2018-01-05 NOTE — Anesthesia Preprocedure Evaluation (Signed)
Anesthesia Evaluation  Patient identified by MRN, date of birth, ID band Patient awake    Reviewed: Allergy & Precautions, NPO status , Patient's Chart, lab work & pertinent test results  History of Anesthesia Complications Negative for: history of anesthetic complications  Airway Mallampati: II  TM Distance: >3 FB Neck ROM: Full    Dental  (+) Poor Dentition   Pulmonary neg sleep apnea, neg COPD, Current Smoker,    breath sounds clear to auscultation- rhonchi (-) wheezing      Cardiovascular Exercise Tolerance: Good (-) hypertension(-) CAD, (-) Past MI, (-) Cardiac Stents and (-) CABG  Rhythm:Regular Rate:Normal - Systolic murmurs and - Diastolic murmurs    Neuro/Psych neg Seizures PSYCHIATRIC DISORDERS Anxiety Depression negative neurological ROS     GI/Hepatic Neg liver ROS, GERD  ,  Endo/Other  negative endocrine ROSneg diabetes  Renal/GU negative Renal ROS     Musculoskeletal negative musculoskeletal ROS (+)   Abdominal (+) - obese,   Peds  Hematology  (+) anemia ,   Anesthesia Other Findings Past Medical History: 1997: Breast cancer ([place])     Comment:  right breast, radiation No date: Bronchitis     Comment:  recent/ 06/09/15 had [facility] x[facility]/resolved 1997: Cancer [facility])     Comment:  right lumpectomy,L/Ad/R  No date: GERD (gastroesophageal reflux disease) No date: Hypercholesteremia No date: Hyperlipidemia No date: Panic attack No date: Personal history of malignant neoplasm of breast   Reproductive/Obstetrics                             Anesthesia Physical Anesthesia Plan  ASA: II  Anesthesia Plan: General   Post-op Pain Management:    Induction: Intravenous  PONV Risk Score and Plan: 1 and Propofol infusion  Airway Management Planned: Natural Airway  Additional Equipment:   Intra-op Plan:   Post-operative Plan:   Informed  Consent: I have reviewed the patients History and Physical, chart, labs and discussed the procedure including the risks, benefits and alternatives for the proposed anesthesia with the patient or authorized representative who has indicated his/her understanding and acceptance.   Dental advisory given  Plan Discussed with: CRNA and Anesthesiologist  Anesthesia Plan Comments:         Anesthesia Quick Evaluation

## 2018-01-05 NOTE — Op Note (Signed)
[facility] Patient Name: Chelsey [name] Procedure Date: 01/05/2018 9:24 AM MRN: [id number] Account #: [id number] Date of Birth: 01-10-1951 Admit Type: Outpatient Age: 67 Room: [facility] ENDO ROOM 4 Gender: Female Note Status: Finalized Procedure:            Upper GI endoscopy Indications:          Abdominal pain Providers:            Jonathon Bellows MD, MD Referring MD:         Guadalupe Maple, MD (Referring MD) Medicines:            Monitored Anesthesia Care Complications:        No immediate complications. Procedure:            Pre-Anesthesia Assessment:                       - Prior to the procedure, a History and Physical was                        performed, and patient medications, allergies and                        sensitivities were reviewed. The patient's tolerance of                        previous anesthesia was reviewed.                       - The risks and benefits of the procedure and the                        sedation options and risks were discussed with the                        patient. All questions were answered and informed                        consent was obtained.                       - ASA Grade Assessment: III - A patient with severe                        systemic disease.                       After obtaining informed consent, the endoscope was                        passed under direct vision. Throughout the procedure,                        the patient's blood pressure, pulse, and oxygen                        saturations were monitored continuously. The Endoscope                        was introduced through the mouth, and advanced to the  third part of duodenum. The upper GI endoscopy was                        accomplished with ease. The patient tolerated the                        procedure well. Findings:      The esophagus was normal.      The stomach was normal.      The examined  duodenum was normal. Impression:           - Normal esophagus.                       - Normal stomach.                       - Normal examined duodenum.                       - No specimens collected. Recommendation:       - Perform a colonoscopy today. Procedure Code(s):    --- Professional ---                       [phone], Esophagogastroduodenoscopy, flexible, transoral;                        diagnostic, including collection of specimen(s) by                        brushing or washing, when performed (separate procedure) Diagnosis Code(s):    --- Professional ---                       R10.9, Unspecified abdominal pain CPT copyright 2018 American Medical Association. All rights reserved. The codes documented in this report are preliminary and upon coder review may  be revised to meet current compliance requirements. Jonathon Bellows, MD Jonathon Bellows MD, MD 01/05/2018 9:41:14 AM This report has been signed electronically. Number of Addenda: 0 Note Initiated On: 01/05/2018 9:24 AM      [facility]

## 2018-01-05 NOTE — Op Note (Signed)
[facility] Gastroenterology Patient Name: Chelsey [name] Procedure Date: 01/05/2018 9:21 AM MRN: [id number] Account #: [id number] Date of Birth: 01/15/1951 Admit Type: Outpatient Age: 67 Room: [facility] ENDO ROOM 4 Gender: Female Note Status: Finalized Procedure:            Colonoscopy Indications:          Abdominal pain Providers:            Jonathon Bellows MD, MD Referring MD:         Guadalupe Maple, MD (Referring MD) Medicines:            Monitored Anesthesia Care Complications:        No immediate complications. Procedure:            Pre-Anesthesia Assessment:                       - Prior to the procedure, a History and Physical was                        performed, and patient medications, allergies and                        sensitivities were reviewed. The patient's tolerance of                        previous anesthesia was reviewed.                       - The risks and benefits of the procedure and the                        sedation options and risks were discussed with the                        patient. All questions were answered and informed                        consent was obtained.                       - ASA Grade Assessment: III - A patient with severe                        systemic disease.                       After obtaining informed consent, the colonoscope was                        passed under direct vision. Throughout the procedure,                        the patient's blood pressure, pulse, and oxygen                        saturations were monitored continuously. The                        Colonoscope was introduced through the anus and                        advanced  to the the cecum, identified by the                        appendiceal orifice, IC valve and transillumination.                        The colonoscopy was performed with ease. The patient                        tolerated the procedure well. The quality of the bowel                     preparation was good. Findings:      The perianal and digital rectal examinations were normal.      Two sessile polyps were found in the ascending colon. The polyps were 3       to 4 mm in size. These polyps were removed with a cold biopsy forceps.       Resection and retrieval were complete.      Non-bleeding internal hemorrhoids were found during retroflexion. The       hemorrhoids were medium-sized and Grade I (internal hemorrhoids that do       not prolapse).      The exam was otherwise without abnormality on direct and retroflexion       views. Impression:           - Two 3 to 4 mm polyps in the ascending colon, removed                        with a cold biopsy forceps. Resected and retrieved.                       - Non-bleeding internal hemorrhoids.                       - The examination was otherwise normal on direct and                        retroflexion views. Recommendation:       - Discharge patient to home (with escort).                       - Resume previous diet.                       - Continue present medications.                       - Await pathology results.                       - Repeat colonoscopy for surveillance based on                        pathology results.                       - Return to GI office in 4 weeks. Procedure Code(s):    --- Professional ---                       [phone], Colonoscopy, flexible; with biopsy, single or  multiple Diagnosis Code(s):    --- Professional ---                       D12.2, Benign neoplasm of ascending colon                       R10.9, Unspecified abdominal pain                       K64.0, First degree hemorrhoids CPT copyright 2018 American Medical Association. All rights reserved. The codes documented in this report are preliminary and upon coder review may  be revised to meet current compliance requirements. Jonathon Bellows, MD Jonathon Bellows MD, MD 01/05/2018 10:03:04 AM This  report has been signed electronically. Number of Addenda: 0 Note Initiated On: 01/05/2018 9:21 AM Scope Withdrawal Time: 0 hours 13 minutes 47 seconds  Total Procedure Duration: 0 hours 17 minutes 36 seconds       [facility]

## 2018-01-06 ENCOUNTER — Telehealth: Payer: Self-pay | Admitting: Gastroenterology

## 2018-01-06 NOTE — Telephone Encounter (Signed)
Pt left vm she has a question about an rx she is supposed to be taken please call pt

## 2018-01-09 LAB — SURGICAL PATHOLOGY

## 2018-01-09 NOTE — Telephone Encounter (Addendum)
Spoke with pt she is concerned about the possible side effects of the Creon samples. Pt also states her bloating and excessive gas has not improved. Pt also states the Linzess 145 mcg is now causing diarrhea. I explained that I will consult with Dr. Vicente Males and then advise.

## 2018-01-10 NOTE — Telephone Encounter (Signed)
Pt is calling for Nurse regarding her rx and the side effects she only has 4 more pills if she is supposed to keep taking them

## 2018-01-10 NOTE — Telephone Encounter (Signed)
Spoke with Chelsey [name] and informed her of Dr. Georgeann Oppenheim directions to change Linzess to 26mcg. Also discussed Dr. Georgeann Oppenheim suggestions to complete the 1 week trial of Creon and to try activated charcoal tablets for the bloating. I explained that if there is still no improvement the next step is treatment with antibiotics. Chelsey [name] plans to stop by the office for more samples of Creon and Linzess 72 mcg.

## 2018-01-10 NOTE — Telephone Encounter (Signed)
Enquire if she has stopped smoking as that causes gas as well, drop linzess to 72 mcg, CREON is worth while trying for a week and if does not help can stop. If all fails will need to treat for SIBO with antibiotics. I would like to try other options before antibiotics . Also can try activated charcoal tablets

## 2018-01-15 ENCOUNTER — Encounter: Payer: Self-pay | Admitting: Gastroenterology

## 2018-01-16 ENCOUNTER — Telehealth: Payer: Self-pay | Admitting: Gastroenterology

## 2018-01-16 NOTE — Telephone Encounter (Signed)
Patient called in today stating the samples of Creon she is unable to tell if they are working,but needs something to get through Christmas. The side affects to the medication is the same as the side effects to the medication.She uses the [facility] in [place]. She is able to be reached at [phone].

## 2018-01-17 NOTE — Telephone Encounter (Signed)
1. Stop CREON since not helping 2. Has linzess helped with constipation - if not then increase dose to 290 mcg  3.IF constipation has resolved then may need a course of Xifaxan for small bowel bacterial overgrowth.   Let me know how constipation is doing and will decide on next step

## 2018-01-19 ENCOUNTER — Telehealth: Payer: Self-pay | Admitting: Gastroenterology

## 2018-01-19 NOTE — Telephone Encounter (Signed)
Pt left vm stating she is in a lot of pain and she would like a call please

## 2018-01-19 NOTE — Telephone Encounter (Signed)
Spoke with pt regarding her complaint of pain. Pt states she's not in severe pain she experiencing a constant ache in here abdomen, along with excessive gas and burping, bloating, unable to eat proper portions, and weight loss. Pt states the medication samples have not improved her symptoms. I explained that I will ask Dr. Vicente Males for his suggestions.

## 2018-01-19 NOTE — Telephone Encounter (Signed)
Other option is to try motegrity- if interested can get samples

## 2018-01-20 NOTE — Telephone Encounter (Signed)
Only option is to try one medication after the other till we find one she likes. Lets check on her in 10 days

## 2018-01-22 ENCOUNTER — Ambulatory Visit
Admission: RE | Admit: 2018-01-22 | Discharge: 2018-01-22 | Disposition: A | Discharge status: Home / Self Care | Payer: Federal, State, Local not specified - PPO | Source: Ambulatory Visit | Attending: Gastroenterology | Admitting: Gastroenterology

## 2018-01-22 ENCOUNTER — Other Ambulatory Visit: Payer: Self-pay | Admitting: Gastroenterology

## 2018-01-22 DIAGNOSIS — K831 Obstruction of bile duct: Secondary | ICD-10-CM

## 2018-01-22 DIAGNOSIS — R1013 Epigastric pain: Secondary | ICD-10-CM | POA: Insufficient documentation

## 2018-01-22 DIAGNOSIS — K449 Diaphragmatic hernia without obstruction or gangrene: Secondary | ICD-10-CM | POA: Diagnosis not present

## 2018-01-22 DIAGNOSIS — Z9049 Acquired absence of other specified parts of digestive tract: Secondary | ICD-10-CM | POA: Diagnosis not present

## 2018-01-22 MED ORDER — GADOBUTROL 1 MMOL/ML IV SOLN
7.0000 mL | Freq: Once | INTRAVENOUS | Status: AC | PRN
  Administered 2018-01-22: 7 mL via INTRAVENOUS

## 2018-01-24 ENCOUNTER — Encounter: Payer: Self-pay | Admitting: Gastroenterology

## 2018-01-26 NOTE — Telephone Encounter (Signed)
Pt left vm she needs a call back from Dr. Georgeann Oppenheim nurse as soon as possible

## 2018-01-28 ENCOUNTER — Emergency Department: Payer: Federal, State, Local not specified - PPO

## 2018-01-28 ENCOUNTER — Emergency Department
Admission: EM | Admit: 2018-01-28 | Discharge: 2018-01-28 | Disposition: A | Discharge status: Home / Self Care | Payer: Federal, State, Local not specified - PPO | Attending: Student in an Organized Health Care Education/Training Program | Admitting: Student in an Organized Health Care Education/Training Program

## 2018-01-28 ENCOUNTER — Other Ambulatory Visit: Payer: Self-pay

## 2018-01-28 DIAGNOSIS — Z853 Personal history of malignant neoplasm of breast: Secondary | ICD-10-CM | POA: Diagnosis not present

## 2018-01-28 DIAGNOSIS — F17210 Nicotine dependence, cigarettes, uncomplicated: Secondary | ICD-10-CM | POA: Insufficient documentation

## 2018-01-28 DIAGNOSIS — Z79899 Other long term (current) drug therapy: Secondary | ICD-10-CM | POA: Insufficient documentation

## 2018-01-28 DIAGNOSIS — R109 Unspecified abdominal pain: Secondary | ICD-10-CM | POA: Diagnosis not present

## 2018-01-28 DIAGNOSIS — R142 Eructation: Secondary | ICD-10-CM | POA: Diagnosis not present

## 2018-01-28 DIAGNOSIS — Z923 Personal history of irradiation: Secondary | ICD-10-CM | POA: Insufficient documentation

## 2018-01-28 DIAGNOSIS — R1013 Epigastric pain: Secondary | ICD-10-CM | POA: Diagnosis present

## 2018-01-28 LAB — CBC
HCT: 46.1 % — ABNORMAL HIGH (ref 36.0–46.0)
Hemoglobin: 15.4 g/dL — ABNORMAL HIGH (ref 12.0–15.0)
MCH: 31.2 pg (ref 26.0–34.0)
MCHC: 33.4 g/dL (ref 30.0–36.0)
MCV: 93.5 fL (ref 80.0–100.0)
Platelets: 289 10*3/uL (ref 150–400)
RBC: 4.93 MIL/uL (ref 3.87–5.11)
RDW: 11.7 % (ref 11.5–15.5)
WBC: 6.4 10*3/uL (ref 4.0–10.5)
nRBC: 0 % (ref 0.0–0.2)

## 2018-01-28 LAB — COMPREHENSIVE METABOLIC PANEL
ALT: 14 U/L (ref 0–44)
AST: 18 U/L (ref 15–41)
Albumin: 4 g/dL (ref 3.5–5.0)
Alkaline Phosphatase: 78 U/L (ref 38–126)
Anion gap: 9 (ref 5–15)
BUN: 9 mg/dL (ref 8–23)
CO2: 25 mmol/L (ref 22–32)
Calcium: 8.9 mg/dL (ref 8.9–10.3)
Chloride: 102 mmol/L (ref 98–111)
Creatinine, Ser: 0.71 mg/dL (ref 0.44–1.00)
GFR calc non Af Amer: >60 mL/min (ref >60)
Glucose, Bld: 102 mg/dL — ABNORMAL HIGH (ref 70–99)
POTASSIUM: 3.4 mmol/L — AB (ref 3.5–5.1)
Sodium: 136 mmol/L (ref 135–145)
Total Bilirubin: 0.8 mg/dL (ref 0.3–1.2)
Total Protein: 7 g/dL (ref 6.5–8.1)

## 2018-01-28 LAB — URINALYSIS, COMPLETE (UACMP) WITH MICROSCOPIC
Bilirubin Urine: NEGATIVE
Glucose, UA: NEGATIVE mg/dL
Hgb urine dipstick: NEGATIVE
Ketones, ur: NEGATIVE mg/dL
Leukocytes, UA: NEGATIVE
Nitrite: NEGATIVE
PH: 6 (ref 5.0–8.0)
Protein, ur: 100 mg/dL — AB
SPECIFIC GRAVITY, URINE: 1.02 (ref 1.005–1.030)

## 2018-01-28 LAB — LIPASE, BLOOD: Lipase: 36 U/L (ref 11–51)

## 2018-01-28 MED ORDER — HYDROCODONE-ACETAMINOPHEN 5-325 MG PO TABS
1.0000 | ORAL_TABLET | Freq: Once | ORAL | Status: AC
  Administered 2018-01-28: 1 via ORAL
  Filled 2018-01-28: qty 1

## 2018-01-28 MED ORDER — SUCRALFATE 1 G PO TABS
1.0000 g | ORAL_TABLET | Freq: Once | ORAL | Status: AC
  Administered 2018-01-28: 1 g via ORAL
  Filled 2018-01-28: qty 1

## 2018-01-28 MED ORDER — LACTULOSE 10 G PO PACK: 10.0000 g | PACK | Freq: Three times a day (TID) | ORAL | 0 refills | Status: DC

## 2018-01-28 MED ORDER — DICYCLOMINE HCL 10 MG PO CAPS
10.0000 mg | ORAL_CAPSULE | Freq: Once | ORAL | Status: AC
  Administered 2018-01-28: 10 mg via ORAL
  Filled 2018-01-28: qty 1

## 2018-01-28 MED ORDER — SIMETHICONE 80 MG PO CHEW: 80.0000 mg | CHEWABLE_TABLET | Freq: Four times a day (QID) | ORAL | 2 refills | Status: DC | PRN

## 2018-01-28 NOTE — ED Triage Notes (Signed)
Pt states pain across lower abd x weeks. States burping all the time. Pt states "I have symptoms like something is wrong with my liver" states nausea but no vomiting.   A&O, ambulatory. No distress noted.

## 2018-01-28 NOTE — ED Provider Notes (Signed)
[facility] Emergency Department Provider Note    First MD Initiated Contact with Patient 01/28/18 1817     (approximate)  I have reviewed the triage vital signs and the nursing notes.   HISTORY  Chief Complaint Abdominal Pain    HPI Chelsey [name] is a 68 y.o. female below listed past medical history presents the ER for several months of persistent epigastric discomfort and belching.  States is worse after eating.  Has been followed with GI.  Presented to the ER today because she "needs to know what is causing it." Denies any nausea or vomiting.  She still moving her bowels.  Denies any history of constipation.  No recent surgeries.  No chest pain or shortness of breath.  No fevers.  Recent endoscopy and colonoscopy as well as MRCP all reassuring.  Patient denies any dysuria or flank pain.   Past Medical History:  Diagnosis Date  . Breast cancer ([place]) 1997   right breast, radiation  . Bronchitis    recent/ 06/09/15 had chest xray/ [facility]/resolved  . Cancer [facility]) 1997   right lumpectomy,L/Ad/R   . GERD (gastroesophageal reflux disease)   . Hypercholesteremia   . Hyperlipidemia   . Panic attack   . Personal history of malignant neoplasm of breast    Family History  Problem Relation Age of Onset  . Anxiety disorder Brother   . Obesity Brother   . COPD Brother   . Kidney disease Mother   . Heart attack Father   . Hypertension Father   . Alcohol abuse Father   . Depression Brother   . Anxiety disorder Brother   . Breast cancer Maternal Grandmother    Past Surgical History:  Procedure Laterality Date  . BICEPT TENODESIS Left 11/23/2016   Procedure: BICEPS TENODESIS;  Surgeon: Leim Fabry, MD;  Location: [facility] ORS;  Service: Orthopedics;  Laterality: Left;  . BREAST EXCISIONAL BIOPSY Right 1997   pos  . BREAST SURGERY Right 1997   lumpectomy  . CHOLECYSTECTOMY N/A 08/06/2016   Procedure: LAPAROSCOPIC CHOLECYSTECTOMY WITH  INTRAOPERATIVE CHOLANGIOGRAM;  Surgeon: Christene Lye, MD;  Location: [facility] ORS;  Service: General;  Laterality: N/A;  . COLONOSCOPY  2008  . COLONOSCOPY WITH PROPOFOL N/A 07/21/2015   Procedure: COLONOSCOPY WITH PROPOFOL;  Surgeon: Lucilla Lame, MD;  Location: [facility];  Service: Endoscopy;  Laterality: N/A;  . COLONOSCOPY WITH PROPOFOL N/A 01/05/2018   Procedure: COLONOSCOPY WITH PROPOFOL;  Surgeon: Jonathon Bellows, MD;  Location: [facility] ENDOSCOPY;  Service: Gastroenterology;  Laterality: N/A;  . ESOPHAGOGASTRODUODENOSCOPY (EGD) WITH PROPOFOL N/A 06/16/2016   Procedure: ESOPHAGOGASTRODUODENOSCOPY (EGD) WITH PROPOFOL;  Surgeon: Christene Lye, MD;  Location: [facility] ENDOSCOPY;  Service: Endoscopy;  Laterality: N/A;  . ESOPHAGOGASTRODUODENOSCOPY (EGD) WITH PROPOFOL N/A 01/05/2018   Procedure: ESOPHAGOGASTRODUODENOSCOPY (EGD) WITH PROPOFOL;  Surgeon: Jonathon Bellows, MD;  Location: [facility] ENDOSCOPY;  Service: Gastroenterology;  Laterality: N/A;  . HARDWARE REMOVAL Left 12/27/2016   Procedure: HARDWARE REMOVAL LEFT  PROXIMAL HUMEROUS;  Surgeon: Leim Fabry, MD;  Location: [facility] ORS;  Service: Orthopedics;  Laterality: Left;  . ORIF HUMERUS FRACTURE Left 11/23/2016   Procedure: OPEN REDUCTION INTERNAL FIXATION (ORIF) PROXIMAL HUMERUS FRACTURE;  Surgeon: Leim Fabry, MD;  Location: [facility] ORS;  Service: Orthopedics;  Laterality: Left;  . REVERSE SHOULDER ARTHROPLASTY Left 12/27/2016   Procedure: REVERSE SHOULDER ARTHROPLASTY;  Surgeon: Leim Fabry, MD;  Location: [facility] ORS;  Service: Orthopedics;  Laterality: Left;   Patient Active Problem List   Diagnosis Date  Noted  . Benign neoplasm of ascending colon   . Osteoporosis 07/27/2017  . Tobacco abuse 07/27/2017  . Painful orthopaedic hardware ([place]) 06/24/2017  . Overweight with body mass index (BMI) 25.0-29.9 05/18/2017  . 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 Insomnia 01/12/2017  . Anemia 01/12/2017  . Status post  shoulder replacement 12/27/2016  . Pressure injury of skin 12/27/2016  . S/P ORIF (open reduction internal fixation) fracture 11/23/2016  . Proximal humerus fracture 11/23/2016  . Panic attack 08/17/2016  . Abdominal pain 05/21/2016  . Special screening for malignant neoplasms, colon   . Benign neoplasm of descending colon   . Benign neoplasm of sigmoid colon   . Gastroesophageal reflux disease without esophagitis 06/13/2014  . H/O hypercholesterolemia 06/13/2014  . Depression, major, recurrent, moderate ([place]) 06/13/2014  . History of breast cancer 03/12/2013      Prior to Admission medications   Medication Sig Start Date End Date Taking? Authorizing Provider  acetaminophen (TYLENOL) 500 MG tablet Take 500 mg every 6 (six) hours as needed by mouth for moderate pain or headache.    [provider]  alendronate (FOSAMAX) 70 MG tablet TAKE ONE TABLET BY MOUTH EACH WEEK Patient not taking: Reported on 12/29/2017 06/07/17   Kathrine Haddock, NP  augmented betamethasone dipropionate (DIPROLENE AF) 0.05 % cream Apply topically 2 (two) times daily. Patient not taking: Reported on 12/29/2017 05/09/17   Kathrine Haddock, NP  busPIRone (BUSPAR) 10 MG tablet Take 1 tablet (10 mg total) by mouth 2 (two) times daily. 12/05/17   Rainey Pines, MD  Calcium Carbonate-Vitamin D (CALCIUM + D PO) Take 1 tablet daily by mouth.     [provider]  Cholecalciferol (VITAMIN D) 2000 units tablet Take 2,000 Units by mouth daily.    [provider]  dicyclomine (BENTYL) 10 MG capsule Take 1 capsule (10 mg total) by mouth 3 (three) times daily as needed for up to 14 days for spasms. or abdominal pain 10/18/17 11/01/17  Hinda Kehr, MD  docusate sodium (COLACE) 100 MG capsule Take 1 tablet once or twice daily as needed for constipation while taking narcotic pain medicine 10/18/17   Hinda Kehr, MD  Ferrous Gluconate (IRON 27 PO) Take 27 mg by mouth daily.    [provider]  gabapentin  (NEURONTIN) 100 MG capsule Take by mouth. 08/16/17 08/16/18  [provider]  HYDROcodone-acetaminophen (NORCO/VICODIN) 5-325 MG tablet Take 1-2 tablets by mouth every 4 (four) hours as needed for moderate pain. Patient not taking: Reported on 12/29/2017 10/18/17   Hinda Kehr, MD  Multiple Vitamin (MULTIVITAMIN) tablet Take 1 tablet daily by mouth.     [provider]  omeprazole (PRILOSEC) 20 MG capsule Take 1 capsule (20 mg total) by mouth 2 (two) times daily before a meal. 07/27/17   Kathrine Haddock, NP  PARoxetine (PAXIL) 20 MG tablet Take 1 tablet (20 mg total) by mouth daily before supper. 12/05/17   Rainey Pines, MD  simvastatin (ZOCOR) 20 MG tablet TAKE ONE TABLET BY MOUTH AT BEDTIME 12/16/17   Kathrine Haddock, NP  traMADol (ULTRAM) 50 MG tablet Take 1 - 2 tablets by mouth every 6 hours as needed for pain not to exceed 8 tablets a day 07/26/17   [provider]  traZODone (DESYREL) 100 MG tablet Take 1 tablet (100 mg total) by mouth at bedtime. 12/05/17   Rainey Pines, MD    Allergies Patient has no known allergies.    Social History Social History  Tobacco Use  . Smoking status: Current Every Day Smoker    Packs/day: 0.25    Years: 5.00    Pack years: 1.25    Types: Cigarettes    Start date: 11/25/2009    Last attempt to quit: 03/09/2016    Years since quitting: 1.8  . Smokeless tobacco: Never Used  . Tobacco comment: Has tried patches and may go back on these again.  Says they work.   Substance Use Topics  . Alcohol use: No    Alcohol/week: 0.0 standard drinks    Comment: rarely  . Drug use: No    Review of Systems Patient denies headaches, rhinorrhea, blurry vision, numbness, shortness of breath, chest pain, edema, cough, abdominal pain, nausea, vomiting, diarrhea, dysuria, fevers, rashes or hallucinations unless otherwise stated above in HPI. ____________________________________________   PHYSICAL EXAM:  VITAL SIGNS: Vitals:   01/28/18 1511  01/28/18 1830  BP: 97/70 114/83  Pulse: (!) 101 70  Resp: 18   Temp: 97.8 F (36.6 C)   SpO2: 100% 100%    Constitutional: Alert and oriented. Anxious appearing, frequent belching Eyes: Conjunctivae are normal.  Head: Atraumatic. Nose: No congestion/rhinnorhea. Mouth/Throat: Mucous membranes are moist.   Neck: No stridor. Painless ROM.  Cardiovascular: Normal rate, regular rhythm. Grossly normal heart sounds.  Good peripheral circulation. Respiratory: Normal respiratory effort.  No retractions. Lungs CTAB. Gastrointestinal: Soft and nontender. No distention. No abdominal bruits. No CVA tenderness. Genitourinary:  Musculoskeletal: No lower extremity tenderness nor edema.  No joint effusions. Neurologic:  Normal speech and language. No gross focal neurologic deficits are appreciated. No facial droop Skin:  Skin is warm, dry and intact. No rash noted. Psychiatric: Mood and affect are anxious. Speech and behavior are normal.  ____________________________________________   LABS (all labs ordered are listed, but only abnormal results are displayed)  Results for orders placed or performed during the hospital encounter of 01/28/18 (from the past 24 hour(s))  Lipase, blood     Status: None   Collection Time: 01/28/18  3:14 PM  Result Value Ref Range   Lipase 36 11 - 51 U/L  Comprehensive metabolic panel     Status: Abnormal   Collection Time: 01/28/18  3:14 PM  Result Value Ref Range   Sodium 136 135 - 145 mmol/L   Potassium 3.4 (L) 3.5 - 5.1 mmol/L   Chloride 102 98 - 111 mmol/L   CO2 25 22 - 32 mmol/L   Glucose, Bld 102 (H) 70 - 99 mg/dL   BUN 9 8 - 23 mg/dL   Creatinine, Ser 0.71 0.44 - 1.00 mg/dL   Calcium 8.9 8.9 - 10.3 mg/dL   Total Protein 7.0 6.5 - 8.1 g/dL   Albumin 4.0 3.5 - 5.0 g/dL   AST 18 15 - 41 U/L   ALT 14 0 - 44 U/L   Alkaline Phosphatase 78 38 - 126 U/L   Total Bilirubin 0.8 0.3 - 1.2 mg/dL   GFR calc non Af Amer >60 >60 mL/min   GFR calc Af Amer >60 >60  mL/min   Anion gap 9 5 - 15  CBC     Status: Abnormal   Collection Time: 01/28/18  3:14 PM  Result Value Ref Range   WBC 6.4 4.0 - 10.5 K/uL   RBC 4.93 3.87 - 5.11 MIL/uL   Hemoglobin 15.4 (H) 12.0 - 15.0 g/dL   HCT 46.1 (H) 36.0 - 46.0 %   MCV 93.5 80.0 - 100.0 fL   MCH 31.2 26.0 -  34.0 pg   MCHC 33.4 30.0 - 36.0 g/dL   RDW 11.7 11.5 - 15.5 %   Platelets 289 150 - 400 K/uL   nRBC 0.0 0.0 - 0.2 %  Urinalysis, Complete w Microscopic     Status: Abnormal   Collection Time: 01/28/18  3:16 PM  Result Value Ref Range   Color, Urine YELLOW (A) YELLOW   APPearance HAZY (A) CLEAR   Specific Gravity, Urine 1.020 1.005 - 1.030   pH 6.0 5.0 - 8.0   Glucose, UA NEGATIVE NEGATIVE mg/dL   Hgb urine dipstick NEGATIVE NEGATIVE   Bilirubin Urine NEGATIVE NEGATIVE   Ketones, ur NEGATIVE NEGATIVE mg/dL   Protein, ur 100 (A) NEGATIVE mg/dL   Nitrite NEGATIVE NEGATIVE   Leukocytes, UA NEGATIVE NEGATIVE   RBC / HPF 0-5 0 - 5 RBC/hpf   WBC, UA 0-5 0 - 5 WBC/hpf   Bacteria, UA MANY (A) NONE SEEN   Squamous Epithelial / LPF 11-20 0 - 5   Mucus PRESENT    Budding Yeast PRESENT    Hyaline Casts, UA PRESENT    ____________________________________________ ____________________________________________  RADIOLOGY  I personally reviewed all radiographic images ordered to evaluate for the above acute complaints and reviewed radiology reports and findings.  These findings were personally discussed with the patient.  Please see medical record for radiology report.  ____________________________________________   PROCEDURES  Procedure(s) performed:  Procedures    Critical Care performed: no ____________________________________________   INITIAL IMPRESSION / ASSESSMENT AND PLAN / ED COURSE  Pertinent labs & imaging results that were available during my care of the patient were reviewed by me and considered in my medical decision making (see chart for details).   DDX: Enteritis, gastritis,  hiatal hernia, SBO, electrolyte abnormality  Chelsey [name] is a 68 y.o. who presents to the ED with symptoms as described above for the past several months.  Patient unable to provide any details as to what brought her to the ER today.  Her abdominal exam is soft benign.  Is tympanic to percussion.  Blood work is reassuring.  Will order x-ray to evaluate for obstructive pattern but have a low suspicion for SBO given her presentation moving her bowels and still passing gas.  Based on her reassuring exam duration of symptoms having had CT and MR imaging as well as endoscopy and colonoscopy since initiation of her symptoms, do not believe that emergent abdominal injury imaging with CT or MR clinically indicated at this time.  Clinical Course as of Jan 29 1927  Sat Jan 28, 2018  1924 Patient evidence of perforation or SBO on x-ray.  Patient states that she is moving her bowels.  No vomiting does have frequent belching.   [PR]  1925 Patient also concerned about her "sticky stools ".  No blood in her stools.  She is tolerating oral hydration and oral medications.  This point believe she stable and appropriate for additional outpatient management.  Discussed signs and symptoms for which she should return to the ER.   [PR]    Clinical Course User Index [PR] Merlyn Lot, MD     As part of my medical decision making, I reviewed the following data within the [place] notes reviewed and incorporated, Labs reviewed, notes from prior ED visits.   ____________________________________________   FINAL CLINICAL IMPRESSION(S) / ED DIAGNOSES  Final diagnoses:  Abdominal pain      NEW MEDICATIONS STARTED DURING THIS VISIT:  New Prescriptions  No medications on file     Note:  This document was prepared using Dragon voice recognition software and may include unintentional dictation errors.    Merlyn Lot, MD 01/28/18 1929

## 2018-01-28 NOTE — Discharge Instructions (Signed)

## 2018-01-29 ENCOUNTER — Encounter: Payer: Self-pay | Admitting: Gastroenterology

## 2018-01-30 LAB — URINE CULTURE

## 2018-01-30 NOTE — Telephone Encounter (Signed)
Spoke with pt and informed her of MRCP results and Dr. Georgeann Oppenheim suggestions that pt may benefit from seeing a surgeon for evaluation of the hernia. Pt wants to discuss the referral further with Dr. Vicente Males during her next office visit.

## 2018-01-30 NOTE — Telephone Encounter (Signed)
-----   Message from Jonathon Bellows, MD sent at 01/29/2018  5:27 PM EST ----- Tationa Stech inform MRCP normal bniliary tracts, shows large hiatal hernia. Likely some of her symptoms could be related to that . Enquire if on a PPI. IF still having symptoms despite that may benefit from discussing with a surgeon for correction of hernia as her symptoms are after meals. We can refer to [facility] or [facility].   C/c Guadalupe Maple, MD   Dr Jonathon Bellows MD,MRCP [facility]) Gastroenterology/Hepatology Pager: [phone]

## 2018-02-02 ENCOUNTER — Ambulatory Visit: Payer: Federal, State, Local not specified - PPO | Admitting: Gastroenterology

## 2018-02-02 ENCOUNTER — Encounter: Payer: Self-pay | Admitting: Gastroenterology

## 2018-02-02 VITALS — BP 90/65 | HR 105 | Ht 63.0 in | Wt 128.2 lb

## 2018-02-02 DIAGNOSIS — K5900 Constipation, unspecified: Secondary | ICD-10-CM | POA: Diagnosis not present

## 2018-02-02 DIAGNOSIS — K449 Diaphragmatic hernia without obstruction or gangrene: Secondary | ICD-10-CM

## 2018-02-02 DIAGNOSIS — K6389 Other specified diseases of intestine: Secondary | ICD-10-CM

## 2018-02-02 DIAGNOSIS — R140 Abdominal distension (gaseous): Secondary | ICD-10-CM | POA: Diagnosis not present

## 2018-02-02 NOTE — Progress Notes (Signed)
Jonathon Bellows MD, MRCP(U.K) [address]  [place], [place] [address]  Main: [phone]  Fax: [phone]   Primary Care Physician: Guadalupe Maple, MD  Primary Gastroenterologist:  Dr. Jonathon Bellows   No chief complaint on file.   HPI: Chelsey [name] is a 68 y.o. female    Summary of history :  She is here today to see me for a follow-up.  She was initially seen on 10/19/2017 when she was referred for abdominal pain.  The pain has been going on for weeks and had been gradually getting worse.  Occurs every time she ate.  Almost immediately.  Had a lot of burping.  Felt like a squeeze.  Better when she stood up.  No clear relieving factors.  Had been on ibuprofen few times a week for shoulder pain.  There was some concern for constipation.  Status post cholecystectomy.  She had a CT scan of the abdomen and it demonstrated mild intra and extra hepatic biliary dilation. No stones seen. Large hiatal hernia. CBD 10 mm and s/p cholecystectomy. LFT were normal except an isolated elevated T bil of 1.3 . 10/19/2017: H. pylori breath test negative.  Hemoglobin and CMP normal in 10/14/2017  Interval history 12/29/2017-02/02/2017   MRCP 01/22/18 : no biliary obstruction, large intrathoracic hiatal herna.  01/05/18 : EGD-normal, colonoscopy - 1 tubular adenoma excised.   Previously ordered MRCP was not performed.  Took linzess- caused diarrhea, changed to motergity and caused diarrhea again, stopped .  It did not help with bloating and gas.   Still has bloating and gas all over the abdomen .Almost stopped smoking.     Current Outpatient Medications  Medication Sig Dispense Refill  . acetaminophen (TYLENOL) 500 MG tablet Take 500 mg every 6 (six) hours as needed by mouth for moderate pain or headache.    . alendronate (FOSAMAX) 70 MG tablet TAKE ONE TABLET BY MOUTH EACH WEEK (Patient not taking: Reported on 12/29/2017) 12 tablet 3  . augmented betamethasone  dipropionate (DIPROLENE AF) 0.05 % cream Apply topically 2 (two) times daily. (Patient not taking: Reported on 12/29/2017) 30 g 0  . busPIRone (BUSPAR) 10 MG tablet Take 1 tablet (10 mg total) by mouth 2 (two) times daily. 180 tablet 1  . Calcium Carbonate-Vitamin D (CALCIUM + D PO) Take 1 tablet daily by mouth.     . Cholecalciferol (VITAMIN D) 2000 units tablet Take 2,000 Units by mouth daily.    Marland Kitchen dicyclomine (BENTYL) 10 MG capsule Take 1 capsule (10 mg total) by mouth 3 (three) times daily as needed for up to 14 days for spasms. or abdominal pain 30 capsule 0  . docusate sodium (COLACE) 100 MG capsule Take 1 tablet once or twice daily as needed for constipation while taking narcotic pain medicine 30 capsule 0  . Ferrous Gluconate (IRON 27 PO) Take 27 mg by mouth daily.    Marland Kitchen gabapentin (NEURONTIN) 100 MG capsule Take by mouth.    Marland Kitchen HYDROcodone-acetaminophen (NORCO/VICODIN) 5-325 MG tablet Take 1-2 tablets by mouth every 4 (four) hours as needed for moderate pain. (Patient not taking: Reported on 12/29/2017) 15 tablet 0  . lactulose (CEPHULAC) 10 g packet Take 1 packet (10 g total) by mouth 3 (three) times daily. 30 each 0  . Multiple Vitamin (MULTIVITAMIN) tablet Take 1 tablet daily by mouth.     Marland Kitchen omeprazole (PRILOSEC) 20 MG capsule Take 1 capsule (20 mg total) by mouth 2 (two) times daily  before a meal. 180 capsule 3  . PARoxetine (PAXIL) 20 MG tablet Take 1 tablet (20 mg total) by mouth daily before supper. 90 tablet 1  . simethicone (GAS-X) 80 MG chewable tablet Chew 1 tablet (80 mg total) by mouth 4 (four) times daily as needed for flatulence. 100 tablet 2  . simvastatin (ZOCOR) 20 MG tablet TAKE ONE TABLET BY MOUTH AT BEDTIME 90 tablet 1  . traMADol (ULTRAM) 50 MG tablet Take 1 - 2 tablets by mouth every 6 hours as needed for pain not to exceed 8 tablets a day    . traZODone (DESYREL) 100 MG tablet Take 1 tablet (100 mg total) by mouth at bedtime. 90 tablet 2   No current  facility-administered medications for this visit.     Allergies as of 02/02/2018  . (No Known Allergies)    ROS:  General: Negative for anorexia, weight loss, fever, chills, fatigue, weakness. ENT: Negative for hoarseness, difficulty swallowing , nasal congestion. CV: Negative for chest pain, angina, palpitations, dyspnea on exertion, peripheral edema.  Respiratory: Negative for dyspnea at rest, dyspnea on exertion, cough, sputum, wheezing.  GI: See history of present illness. GU:  Negative for dysuria, hematuria, urinary incontinence, urinary frequency, nocturnal urination.  Endo: Negative for unusual weight change.    Physical Examination:   LMP  (LMP Unknown)   General: Well-nourished, well-developed in no acute distress.  Eyes: No icterus. Conjunctivae pink. Mouth: Oropharyngeal mucosa moist and pink , no lesions erythema or exudate. Lungs: Clear to auscultation bilaterally. Non-labored. Heart: Regular rate and rhythm, no murmurs rubs or gallops.  Abdomen: Bowel sounds are normal, nontender, nondistended, no hepatosplenomegaly or masses, no abdominal bruits or hernia , no rebound or guarding.   Extremities: No lower extremity edema. No clubbing or deformities. Neuro: Alert and oriented x 3.  Grossly intact. Skin: Warm and dry, no jaundice.   Psych: Alert and cooperative, normal mood and affect.   Imaging Studies: Mr 3d Recon At Scanner  Result Date: 01/22/2018 CLINICAL DATA:  Epigastric abdominal pain. Unintentional weight loss of 60 pounds over the prior year. Nausea. Cholecystectomy 08/06/2016. Remote history of right breast cancer. EXAM: MRI ABDOMEN WITHOUT AND WITH CONTRAST (INCLUDING MRCP) TECHNIQUE: Multiplanar multisequence MR imaging of the abdomen was performed both before and after the administration of intravenous contrast. Heavily T2-weighted images of the biliary and pancreatic ducts were obtained, and three-dimensional MRCP images were rendered by post  processing. CONTRAST:  7 cc Gadavist IV. COMPARISON:  10/18/2017 CT abdomen/pelvis.  06/04/2016 MRI abdomen. FINDINGS: Lower chest: No acute abnormality at the lung bases. Hepatobiliary: Normal liver size and configuration. No hepatic steatosis. Two scattered subcentimeter simple liver cysts. No suspicious liver masses. Cholecystectomy. Mild diffuse central intrahepatic biliary ductal dilatation. Common bile duct diameter 8 mm. No filling defects within the bile ducts. No biliary strictures or masses. Pancreas: No pancreatic mass. Mild ectasia of ventral pancreatic duct within the pancreatic head (4 mm diameter) with normal caliber main pancreatic duct. No pancreas divisum. Spleen: Normal size. No mass. Adrenals/Urinary Tract: Normal adrenals. No hydronephrosis. Scattered subcentimeter simple renal cysts in both kidneys. No suspicious renal masses. Stomach/Bowel: Large hiatal hernia containing the entire nondistended stomach, unchanged. No acute gastric abnormality. Visualized small and large bowel is normal caliber, with no bowel wall thickening. Vascular/Lymphatic: Normal caliber abdominal aorta. Patent portal, splenic, hepatic and renal veins. No pathologically enlarged lymph nodes in the abdomen. Other: No abdominal ascites or focal fluid collection. Musculoskeletal: No aggressive appearing focal osseous lesions. IMPRESSION:  1. Bile ducts are within normal post cholecystectomy limits. CBD diameter 8 mm. No evidence of choledocholithiasis. 2. Large hiatal hernia.  Stomach is entirely intrathoracic. Electronically Signed   By: Ilona Sorrel M.D.   On: 01/22/2018 23:58   Dg Abd 2 Views  Result Date: 01/28/2018 CLINICAL DATA:  Lower abdominal pain.  Belching. EXAM: ABDOMEN - 2 VIEW COMPARISON:  Multiple exams, including 10/18/2016 CT FINDINGS: There is evidence of a moderate-sized hiatal hernia. I can not exclude retro diaphragmatic airspace opacity on the left. Gas is present distributed throughout the large and  small bowel without dilated bowel observed. No significant abnormal air-fluid levels. No findings of organomegaly. Right upper quadrant clips are likely from prior cholecystectomy. Patient is moderately rotated to the left on today's exam. IMPRESSION: 1. There is gas throughout the small and large bowel which does not appear dilated. 2. At least moderate-sized hiatal hernia. 3. Can not exclude a retro diaphragmatic airspace opacity on the left. Today's exam was not optimized to assess the lungs. Electronically Signed   By: Van Clines M.D.   On: 01/28/2018 19:10   Mr Abdomen Mrcp Moise Boring Contast  Result Date: 01/22/2018 CLINICAL DATA:  Epigastric abdominal pain. Unintentional weight loss of 60 pounds over the prior year. Nausea. Cholecystectomy 08/06/2016. Remote history of right breast cancer. EXAM: MRI ABDOMEN WITHOUT AND WITH CONTRAST (INCLUDING MRCP) TECHNIQUE: Multiplanar multisequence MR imaging of the abdomen was performed both before and after the administration of intravenous contrast. Heavily T2-weighted images of the biliary and pancreatic ducts were obtained, and three-dimensional MRCP images were rendered by post processing. CONTRAST:  7 cc Gadavist IV. COMPARISON:  10/18/2017 CT abdomen/pelvis.  06/04/2016 MRI abdomen. FINDINGS: Lower chest: No acute abnormality at the lung bases. Hepatobiliary: Normal liver size and configuration. No hepatic steatosis. Two scattered subcentimeter simple liver cysts. No suspicious liver masses. Cholecystectomy. Mild diffuse central intrahepatic biliary ductal dilatation. Common bile duct diameter 8 mm. No filling defects within the bile ducts. No biliary strictures or masses. Pancreas: No pancreatic mass. Mild ectasia of ventral pancreatic duct within the pancreatic head (4 mm diameter) with normal caliber main pancreatic duct. No pancreas divisum. Spleen: Normal size. No mass. Adrenals/Urinary Tract: Normal adrenals. No hydronephrosis. Scattered subcentimeter  simple renal cysts in both kidneys. No suspicious renal masses. Stomach/Bowel: Large hiatal hernia containing the entire nondistended stomach, unchanged. No acute gastric abnormality. Visualized small and large bowel is normal caliber, with no bowel wall thickening. Vascular/Lymphatic: Normal caliber abdominal aorta. Patent portal, splenic, hepatic and renal veins. No pathologically enlarged lymph nodes in the abdomen. Other: No abdominal ascites or focal fluid collection. Musculoskeletal: No aggressive appearing focal osseous lesions. IMPRESSION: 1. Bile ducts are within normal post cholecystectomy limits. CBD diameter 8 mm. No evidence of choledocholithiasis. 2. Large hiatal hernia.  Stomach is entirely intrathoracic. Electronically Signed   By: Ilona Sorrel M.D.   On: 01/22/2018 23:58    Assessment and Plan:   Chelsey [name] is a 68 y.o. y/o female here to follow up  for abdominal pain, gas and bloating  . t. Appears her symptoms are from a combination of very large hiatal hernia , constipation, gas and bloating.   Plan  1. Stop probiotics  2. Miralax BID 3. Trial of Xifaxan for gas and bloating -samples provided 4. Refer to Dr Dahlia Byes to discuss if fixing her large hernia seen on MRI would help with her symptoms.  5. IB guard for pain.  6. LOW FODMAP diet. Information provided  Dr Jonathon Bellows  MD,MRCP [facility]) Follow up in 2 weeks

## 2018-02-03 ENCOUNTER — Other Ambulatory Visit: Payer: Self-pay

## 2018-02-03 ENCOUNTER — Telehealth: Payer: Self-pay

## 2018-02-03 DIAGNOSIS — K449 Diaphragmatic hernia without obstruction or gangrene: Secondary | ICD-10-CM

## 2018-02-03 NOTE — Telephone Encounter (Signed)
Spoke with pt and informed her that we have referred her to [facility] for evaluation with Dr. Dahlia Byes. Pt is aware of her appointment date, time and location.

## 2018-02-08 ENCOUNTER — Telehealth: Payer: Self-pay | Admitting: Gastroenterology

## 2018-02-08 NOTE — Telephone Encounter (Signed)
Patient called & lvm on machine asking Dr Georgeann Oppenheim nurse to call her today.

## 2018-02-13 ENCOUNTER — Telehealth: Payer: Self-pay | Admitting: Gastroenterology

## 2018-02-13 NOTE — Telephone Encounter (Signed)
Patient requested that Lurline Hare return her call today or 02-14-2018.

## 2018-02-14 NOTE — Telephone Encounter (Signed)
1. How often is she having a bowel movement ? 2. How often did she take miralax?

## 2018-02-15 ENCOUNTER — Telehealth: Payer: Self-pay

## 2018-02-15 ENCOUNTER — Encounter: Payer: Self-pay | Admitting: Surgery

## 2018-02-15 ENCOUNTER — Other Ambulatory Visit: Payer: Self-pay

## 2018-02-15 ENCOUNTER — Ambulatory Visit: Payer: Federal, State, Local not specified - PPO | Admitting: Surgery

## 2018-02-15 VITALS — BP 92/67 | HR 88 | Temp 97.2°F | Ht 63.0 in | Wt 124.4 lb

## 2018-02-15 DIAGNOSIS — K449 Diaphragmatic hernia without obstruction or gangrene: Secondary | ICD-10-CM | POA: Diagnosis not present

## 2018-02-15 DIAGNOSIS — R1013 Epigastric pain: Secondary | ICD-10-CM

## 2018-02-15 NOTE — Patient Instructions (Addendum)
Patient will need to have a barium swallow and return to the office to see Dr.Pabon after the study is complete to discuss surgery. Patient will also need a EKG and Chest X-ray.   Call the office with any questions or concerns.   You are scheduled for a Barium Swallow at [facility] on 02/23/18 at 9:00 am -You will arrive there by 8:45 am and have nothing to eat or drink after midnight the night before. -you will report to the [place] entrance and go to the first desk on your right which is Registration.  You may get your Chest X-Ray done at anytime. You can go to [facility] and go to the [place] entrance and report to the first desk on the right which is Registration. You may also go to the [facility] to have this done. This is just a walk in procedure, no appointment is needed.   We will call you about scheduling your EKG  You will follow up here with Dr Dahlia Byes on 03/01/2018 at 10:00 am.

## 2018-02-15 NOTE — Telephone Encounter (Signed)
Message left for the patient to call back.   We have scheduled her for a EKG at [facility] on 02/23/18 at 9:45 am. She is already scheduled for a Barium swallow that morning and they will do the EKG after this study that day.

## 2018-02-15 NOTE — Consult Note (Signed)
Patient ID: Chelsey [name], female   DOB: [birth year], 68 y.o.   MRN: [id number]  HPI Chelsey [name] is a 68 y.o. female seen in consultation at the request of Dr. Vicente Males. She has been experiencing diverse GI issues for the last few months. She reports early satiety, decrease appetite. Some dysphagia and epigastric pain. Her pain is intermittent, dull, worsening after she eats a big meal. She does have eructation and some change in her stools. She reports oily stool. NO fevers, no chills, she does report weight loss. Sometimes she needs to drink water to be able to swallow some meals. No food impaction. She did have CT scan and MRI that I have personally reviewed showing a large HH w her entire stomach in the mediastinum, no evidence of strangulation. No evidence of choledocholithiasis. She smokes about 1-2 cig a day. She experiences some weakness especially when walking for long distances. Hx of Lap chole 2018. EGD pers reviewed 2018 at that time no hernia was evident. CBC and cmp are nml  HPI  Past Medical History:  Diagnosis Date  . Breast cancer ([place]) 1997   right breast, radiation  . Bronchitis    recent/ 06/09/15 had chest xray/ [facility]/resolved  . Cancer [facility]) 1997   right lumpectomy,L/Ad/R   . GERD (gastroesophageal reflux disease)   . Hypercholesteremia   . Hyperlipidemia   . Panic attack   . Personal history of malignant neoplasm of breast     Past Surgical History:  Procedure Laterality Date  . BICEPT TENODESIS Left 11/23/2016   Procedure: BICEPS TENODESIS;  Surgeon: Leim Fabry, MD;  Location: [facility] ORS;  Service: Orthopedics;  Laterality: Left;  . BREAST EXCISIONAL BIOPSY Right 1997   pos  . BREAST SURGERY Right 1997   lumpectomy  . CHOLECYSTECTOMY N/A 08/06/2016   Procedure: LAPAROSCOPIC CHOLECYSTECTOMY WITH INTRAOPERATIVE CHOLANGIOGRAM;  Surgeon: Christene Lye, MD;  Location: [facility] ORS;  Service: General;  Laterality: N/A;  . COLONOSCOPY  2008  .  COLONOSCOPY WITH PROPOFOL N/A 07/21/2015   Procedure: COLONOSCOPY WITH PROPOFOL;  Surgeon: Lucilla Lame, MD;  Location: [place];  Service: Endoscopy;  Laterality: N/A;  . COLONOSCOPY WITH PROPOFOL N/A 01/05/2018   Procedure: COLONOSCOPY WITH PROPOFOL;  Surgeon: Jonathon Bellows, MD;  Location: [facility] ENDOSCOPY;  Service: Gastroenterology;  Laterality: N/A;  . ESOPHAGOGASTRODUODENOSCOPY (EGD) WITH PROPOFOL N/A 06/16/2016   Procedure: ESOPHAGOGASTRODUODENOSCOPY (EGD) WITH PROPOFOL;  Surgeon: Christene Lye, MD;  Location: [facility] ENDOSCOPY;  Service: Endoscopy;  Laterality: N/A;  . ESOPHAGOGASTRODUODENOSCOPY (EGD) WITH PROPOFOL N/A 01/05/2018   Procedure: ESOPHAGOGASTRODUODENOSCOPY (EGD) WITH PROPOFOL;  Surgeon: Jonathon Bellows, MD;  Location: [facility] ENDOSCOPY;  Service: Gastroenterology;  Laterality: N/A;  . HARDWARE REMOVAL Left 12/27/2016   Procedure: HARDWARE REMOVAL LEFT  PROXIMAL HUMEROUS;  Surgeon: Leim Fabry, MD;  Location: [facility] ORS;  Service: Orthopedics;  Laterality: Left;  . ORIF HUMERUS FRACTURE Left 11/23/2016   Procedure: OPEN REDUCTION INTERNAL FIXATION (ORIF) PROXIMAL HUMERUS FRACTURE;  Surgeon: Leim Fabry, MD;  Location: [facility] ORS;  Service: Orthopedics;  Laterality: Left;  . REVERSE SHOULDER ARTHROPLASTY Left 12/27/2016   Procedure: REVERSE SHOULDER ARTHROPLASTY;  Surgeon: Leim Fabry, MD;  Location: [facility] ORS;  Service: Orthopedics;  Laterality: Left;    Family History  Problem Relation Age of Onset  . Anxiety disorder Brother   . Obesity Brother   . COPD Brother   . Kidney disease Mother   . Heart attack Father   . Hypertension Father   . Alcohol abuse Father   .  Depression Brother   . Anxiety disorder Brother   . Breast cancer Maternal Grandmother     Social History Social History   Tobacco Use  . Smoking status: Current Every Day Smoker    Packs/day: 0.25    Years: 5.00    Pack years: 1.25    Types: Cigarettes    Start date: 11/25/2009    Last attempt to  quit: 03/09/2016    Years since quitting: 1.9  . Smokeless tobacco: Never Used  . Tobacco comment: Has tried patches and may go back on these again.  Says they work.   Substance Use Topics  . Alcohol use: No    Alcohol/week: 0.0 standard drinks    Comment: rarely  . Drug use: No    No Known Allergies  Current Outpatient Medications  Medication Sig Dispense Refill  . busPIRone (BUSPAR) 10 MG tablet Take 1 tablet (10 mg total) by mouth 2 (two) times daily. 180 tablet 1  . Calcium Carbonate-Vitamin D (CALCIUM + D PO) Take 1 tablet daily by mouth.     . Cholecalciferol (VITAMIN D) 2000 units tablet Take 2,000 Units by mouth daily.    Marland Kitchen dicyclomine (BENTYL) 10 MG capsule Take 1 capsule (10 mg total) by mouth 3 (three) times daily as needed for up to 14 days for spasms. or abdominal pain 30 capsule 0  . Multiple Vitamin (MULTIVITAMIN) tablet Take 1 tablet daily by mouth.     Marland Kitchen omeprazole (PRILOSEC) 20 MG capsule Take 1 capsule (20 mg total) by mouth 2 (two) times daily before a meal. 180 capsule 3  . PARoxetine (PAXIL) 20 MG tablet Take 1 tablet (20 mg total) by mouth daily before supper. 90 tablet 1  . simvastatin (ZOCOR) 20 MG tablet TAKE ONE TABLET BY MOUTH AT BEDTIME 90 tablet 1  . traZODone (DESYREL) 100 MG tablet Take 1 tablet (100 mg total) by mouth at bedtime. 90 tablet 2   No current facility-administered medications for this visit.      Review of Systems Full ROS  was asked and was negative except for the information on the HPI  Physical Exam Blood pressure 92/67, pulse 88, temperature (!) 97.2 F (36.2 C), temperature source Temporal, height 5\' 3"  (1.6 m), weight 124 lb 6.4 oz (56.4 kg), SpO2 97 %. CONSTITUTIONAL: NAD EYES: Pupils are equal, round, and reactive to light, Sclera are non-icteric. EARS, NOSE, MOUTH AND THROAT: The oropharynx is clear. The oral mucosa is pink and moist. Hearing is intact to voice. LYMPH NODES:  Lymph nodes in the neck are normal. RESPIRATORY:   Lungs are clear. There is normal respiratory effort, with equal breath sounds bilaterally, and without pathologic use of accessory muscles. CARDIOVASCULAR: Heart is regular without murmurs, gallops, or rubs. GI: The abdomen is  soft, nontender, and nondistended. There are no palpable masses. There is no hepatosplenomegaly. There are normal bowel sounds in all quadrants. GU: Rectal deferred.   MUSCULOSKELETAL: Normal muscle strength and tone. No cyanosis or edema.   SKIN: Turgor is good and there are no pathologic skin lesions or ulcers. NEUROLOGIC: Motor and sensation is grossly normal. Cranial nerves are grossly intact. PSYCH:  Oriented to person, place and time. Affect is normal.  Data Reviewed  I have personally reviewed the patient's imaging, laboratory findings and medical records.    Assessment/Plan 68 yo female with significant dyspepsia, bloating and dysphagia very likely related to large Hiatal Hernia. D/W the pt in detail about her disease process. I do think  she will benefit from [facility] repair. She will need basic labs, CXR, ekg and barium swallow for operative planning. Id id talk to her about the surgery, and the post op course including diet modification. I will see  Time spent with the patient was 60 minutes, with more than 50% of the time spent in face-to-face education, counseling and care coordination.   A copy of this report was sent to the referring provider   Caroleen Hamman, MD FACS General Surgeon 02/15/2018, 12:19 PM

## 2018-02-15 NOTE — Telephone Encounter (Signed)
Patient has been notified

## 2018-02-17 ENCOUNTER — Other Ambulatory Visit: Payer: Self-pay

## 2018-02-17 NOTE — Telephone Encounter (Signed)
Can try trulance give samples

## 2018-02-22 NOTE — Telephone Encounter (Signed)
Spoke with pt and informed her of Dr. Georgeann Oppenheim recommendation for pt to try Trulance. Pt plans to pick up samples today.

## 2018-02-23 ENCOUNTER — Ambulatory Visit
Admission: RE | Admit: 2018-02-23 | Discharge: 2018-02-23 | Disposition: A | Discharge status: Home / Self Care | Payer: Federal, State, Local not specified - PPO | Source: Ambulatory Visit | Attending: Surgery | Admitting: Surgery

## 2018-02-23 ENCOUNTER — Telehealth: Payer: Self-pay | Admitting: Gastroenterology

## 2018-02-23 ENCOUNTER — Other Ambulatory Visit: Payer: Self-pay | Admitting: Surgery

## 2018-02-23 DIAGNOSIS — R1013 Epigastric pain: Secondary | ICD-10-CM | POA: Diagnosis not present

## 2018-02-23 DIAGNOSIS — K449 Diaphragmatic hernia without obstruction or gangrene: Secondary | ICD-10-CM | POA: Diagnosis present

## 2018-02-23 NOTE — Telephone Encounter (Signed)
Pt  Is calling for Dijah

## 2018-02-23 NOTE — Telephone Encounter (Signed)
PT left vm for Dr. Vicente Males nurse regarding the new rx please call pt

## 2018-02-24 ENCOUNTER — Telehealth: Payer: Self-pay

## 2018-02-24 NOTE — Telephone Encounter (Signed)
Patient notified of Barium swallow study per Dr.Pabon. Patient reminded of 03/01/2018 appointment @ 10 am with Dr.Pabon.

## 2018-02-27 ENCOUNTER — Telehealth: Payer: Self-pay | Admitting: *Deleted

## 2018-02-27 NOTE — Telephone Encounter (Signed)
Patient notified as instructed.   The patient will follow up on Wednesday, 03-01-18 as scheduled with Dr. Dahlia Byes.   She verbalizes understanding.

## 2018-02-27 NOTE — Telephone Encounter (Signed)
Spoke with pt she states the Trulance samples have not helped. Pt states her stool has still been a pale yellow color and very sticky. Pt requests Dr. Georgeann Oppenheim suggestions on what can be done to resolve this issue. I explained that I will relay this information to Dr. Vicente Males and then advise.

## 2018-02-27 NOTE — Telephone Encounter (Signed)
-----   Message from Jules Husbands, MD sent at 02/24/2018  7:52 AM EST ----- Please let her know that her cxr was nml. Barium swallow showed hernia. We will discuss further details on f/u appt ----- Message ----- From: Interface, Rad Results In Sent: 02/23/2018   2:33 PM EST To: Jules Husbands, MD

## 2018-03-01 ENCOUNTER — Ambulatory Visit: Payer: Federal, State, Local not specified - PPO | Admitting: Surgery

## 2018-03-01 ENCOUNTER — Encounter: Payer: Self-pay | Admitting: *Deleted

## 2018-03-01 ENCOUNTER — Other Ambulatory Visit: Payer: Self-pay

## 2018-03-01 ENCOUNTER — Encounter: Payer: Self-pay | Admitting: Surgery

## 2018-03-01 VITALS — BP 89/60 | HR 105 | Temp 97.6°F | Resp 16 | Ht 63.0 in | Wt 121.0 lb

## 2018-03-01 DIAGNOSIS — K449 Diaphragmatic hernia without obstruction or gangrene: Secondary | ICD-10-CM | POA: Diagnosis not present

## 2018-03-01 NOTE — Progress Notes (Signed)
Patient's surgery has been scheduled for 03-09-18 at [facility] with Dr. Dahlia Byes.  The patient is aware she will be contacted by the [facility] to complete a phone interview sometime in the near future.  The patient is aware to call the office should she have further questions.

## 2018-03-01 NOTE — Progress Notes (Signed)
Outpatient Surgical Follow Up  03/01/2018  Chelsey [name] is an 68 y.o. female.   Chief Complaint  Patient presents with  . Follow-up    f/u for hiatal hernia, had CXR, EKG, and Barium swallow studies completed    HPI: Mrs Womac following a giant hiatal hernia.  She did have a chest x-ray as well as a barium swallow that I have personally reviewed and discussed at length and in detail with our radiologist.  There is evidence of a large hiatal hernia and organoaxial malrotation.  There is tertiary contraction of the distal esophagus.  There is no evidence of birds beak or evidence of achalasia. She continues to have bloating, irritation early satiety and continues to have difficulty gaining weight. Able to perform more than 4 METS of activity without any shortness of breath or chest pain.  Recent chest x-ray does not show any new active cardiopulmonary disease.  Past Medical History:  Diagnosis Date  . Breast cancer ([place]) 1997   right breast, radiation  . Bronchitis    recent/ 06/09/15 [facility]/resolved  . Cancer [facility]) 1997   right lumpectomy,L/Ad/R   . GERD (gastroesophageal reflux disease)   . Hypercholesteremia   . Hyperlipidemia   . Panic attack   . Personal history of malignant neoplasm of breast     Past Surgical History:  Procedure Laterality Date  . BICEPT TENODESIS Left 11/23/2016   Procedure: BICEPS TENODESIS;  Surgeon: Leim Fabry, MD;  Location: [facility];  Service: Orthopedics;  Laterality: Left;  . BREAST EXCISIONAL BIOPSY Right 1997   pos  . BREAST SURGERY Right 1997   lumpectomy  . CHOLECYSTECTOMY N/A 08/06/2016   Procedure: LAPAROSCOPIC CHOLECYSTECTOMY WITH INTRAOPERATIVE CHOLANGIOGRAM;  Surgeon: Christene Lye, MD;  Location: [facility];  Service: General;  Laterality: N/A;  . COLONOSCOPY  2008  . COLONOSCOPY WITH PROPOFOL N/A 07/21/2015   Procedure: COLONOSCOPY WITH PROPOFOL;  Surgeon: Lucilla Lame, MD;  Location: [place];  Service: Endoscopy;  Laterality: N/A;  . COLONOSCOPY WITH PROPOFOL N/A 01/05/2018   Procedure: COLONOSCOPY WITH PROPOFOL;  Surgeon: Jonathon Bellows, MD;  Location: [facility];  Service: Gastroenterology;  Laterality: N/A;  . ESOPHAGOGASTRODUODENOSCOPY (EGD) WITH PROPOFOL N/A 06/16/2016   Procedure: ESOPHAGOGASTRODUODENOSCOPY (EGD) WITH PROPOFOL;  Surgeon: Christene Lye, MD;  Location: [facility];  Service: Endoscopy;  Laterality: N/A;  . ESOPHAGOGASTRODUODENOSCOPY (EGD) WITH PROPOFOL N/A 01/05/2018   Procedure: ESOPHAGOGASTRODUODENOSCOPY (EGD) WITH PROPOFOL;  Surgeon: Jonathon Bellows, MD;  Location: [facility] ENDOSCOPY;  Service: Gastroenterology;  Laterality: N/A;  . HARDWARE REMOVAL Left 12/27/2016   Procedure: HARDWARE REMOVAL LEFT  PROXIMAL HUMEROUS;  Surgeon: Leim Fabry, MD;  Location: [facility];  Service: Orthopedics;  Laterality: Left;  . ORIF HUMERUS FRACTURE Left 11/23/2016   Procedure: OPEN REDUCTION INTERNAL FIXATION (ORIF) PROXIMAL HUMERUS FRACTURE;  Surgeon: Leim Fabry, MD;  Location: [facility];  Service: Orthopedics;  Laterality: Left;  . REVERSE SHOULDER ARTHROPLASTY Left 12/27/2016   Procedure: REVERSE SHOULDER ARTHROPLASTY;  Surgeon: Leim Fabry, MD;  Location: [facility];  Service: Orthopedics;  Laterality: Left;    Family History  Problem Relation Age of Onset  . Anxiety disorder Brother   . Obesity Brother   . COPD Brother   . Kidney disease Mother   . Heart attack Father   . Hypertension Father   . Alcohol abuse Father   . Depression Brother   . Anxiety disorder Brother   . Breast cancer Maternal Grandmother     Social  History:  reports that she has been smoking cigarettes. She started smoking about 8 years ago. She has a 1.25 pack-year smoking history. She has never used smokeless tobacco. She reports that she does not drink alcohol or use drugs.  Allergies: No Known Allergies  Medications reviewed.    ROS Full ROS performed and is  otherwise negative other than what is stated in HPI   BP (!) 89/60   Pulse (!) 105   Temp 97.6 F (36.4 C) (Temporal)   Resp 16   Ht 5\' 3"  (1.6 m)   Wt 121 lb (54.9 kg)   LMP  (LMP Unknown)   SpO2 97%   BMI 21.43 kg/m   Physical Exam Vitals signs and nursing note reviewed.  Constitutional:      General: She is not in acute distress.    Appearance: Normal appearance.  Neck:     Musculoskeletal: Normal range of motion and neck supple.  Cardiovascular:     Rate and Rhythm: Normal rate and regular rhythm.  Pulmonary:     Effort: Pulmonary effort is normal.     Breath sounds: No stridor. No rhonchi.  Chest:     Chest wall: No tenderness.  Abdominal:     General: Abdomen is flat. There is no distension.     Palpations: There is no mass.     Tenderness: There is no abdominal tenderness. There is no guarding or rebound.     Hernia: No hernia is present.  Skin:    General: Skin is warm and dry.     Capillary Refill: Capillary refill takes less than 2 seconds.     Coloration: Skin is not jaundiced.  Neurological:     General: No focal deficit present.     Mental Status: She is alert and oriented to person, place, and time.  Psychiatric:        Mood and Affect: Mood normal.        Behavior: Behavior normal.        Thought Content: Thought content normal.        Judgment: Judgment normal.       Assessment/Plan: 68 year old with a giant hiatal hernia with organoaxial Rotation that is causing significant symptoms.  I had a discussion with the patient about the findings.  I do think that she will benefit from a repair of the hiatal hernia and fundoplication.  I have also discussed the case in detail with the radiologist and he does not think that this contractions are consistent with achalasia or any other significant esophageal motility disorder.  This is likely related to her hiatal hernia that is very large. I have discussed with the patient in detail about her disease  process.  I do recommend repair.  I have discussed specifically with the patient about the operative time.  Risk, benefits and possible complications including but not limited to: Bleeding, esophageal perforation, dysphagia, chronic pain and bowel injuries. She understands and she wishes to proceed.  We will plan for robotic hiatal hernia repair and fundoplication during the same time.   Greater than 50% of the 40 minutes  visit was spent in counseling/coordination of care   Caroleen Hamman, MD [facility]

## 2018-03-01 NOTE — H&P (View-Only) (Signed)
Outpatient Surgical Follow Up  03/01/2018  Chelsey [name] is an 68 y.o. female.   Chief Complaint  Patient presents with  . Follow-up    f/u for hiatal hernia, had CXR, EKG, and Barium swallow studies completed    HPI: Chelsey [name] following a giant hiatal hernia.  She did have a chest x-ray as well as a barium swallow that I have personally reviewed and discussed at length and in detail with our radiologist.  There is evidence of a large hiatal hernia and organoaxial malrotation.  There is tertiary contraction of the distal esophagus.  There is no evidence of birds beak or evidence of achalasia. She continues to have bloating, irritation early satiety and continues to have difficulty gaining weight. Able to perform more than 4 METS of activity without any shortness of breath or chest pain.  Recent chest x-ray does not show any new active cardiopulmonary disease.  Past Medical History:  Diagnosis Date  . Breast cancer ([place]) 1997   right breast, radiation  . Bronchitis    recent/ 06/09/15 had [facility]/resolved  . Cancer [facility]) 1997   right lumpectomy,L/Ad/R   . GERD (gastroesophageal reflux disease)   . Hypercholesteremia   . Hyperlipidemia   . Panic attack   . Personal history of malignant neoplasm of breast     Past Surgical History:  Procedure Laterality Date  . BICEPT TENODESIS Left 11/23/2016   Procedure: BICEPS TENODESIS;  Surgeon: Leim Fabry, MD;  Location: [facility];  Service: Orthopedics;  Laterality: Left;  . BREAST EXCISIONAL BIOPSY Right 1997   pos  . BREAST SURGERY Right 1997   lumpectomy  . CHOLECYSTECTOMY N/A 08/06/2016   Procedure: LAPAROSCOPIC CHOLECYSTECTOMY WITH INTRAOPERATIVE CHOLANGIOGRAM;  Surgeon: Christene Lye, MD;  Location: [facility];  Service: General;  Laterality: N/A;  . COLONOSCOPY  2008  . COLONOSCOPY WITH PROPOFOL N/A 07/21/2015   Procedure: COLONOSCOPY WITH PROPOFOL;  Surgeon: Lucilla Lame, MD;  Location: [place];  Service: Endoscopy;  Laterality: N/A;  . COLONOSCOPY WITH PROPOFOL N/A 01/05/2018   Procedure: COLONOSCOPY WITH PROPOFOL;  Surgeon: Jonathon Bellows, MD;  Location: [facility] ENDOSCOPY;  Service: Gastroenterology;  Laterality: N/A;  . ESOPHAGOGASTRODUODENOSCOPY (EGD) WITH PROPOFOL N/A 06/16/2016   Procedure: ESOPHAGOGASTRODUODENOSCOPY (EGD) WITH PROPOFOL;  Surgeon: Christene Lye, MD;  Location: [facility];  Service: Endoscopy;  Laterality: N/A;  . ESOPHAGOGASTRODUODENOSCOPY (EGD) WITH PROPOFOL N/A 01/05/2018   Procedure: ESOPHAGOGASTRODUODENOSCOPY (EGD) WITH PROPOFOL;  Surgeon: Jonathon Bellows, MD;  Location: [facility] ENDOSCOPY;  Service: Gastroenterology;  Laterality: N/A;  . HARDWARE REMOVAL Left 12/27/2016   Procedure: HARDWARE REMOVAL LEFT  PROXIMAL HUMEROUS;  Surgeon: Leim Fabry, MD;  Location: [facility];  Service: Orthopedics;  Laterality: Left;  . ORIF HUMERUS FRACTURE Left 11/23/2016   Procedure: OPEN REDUCTION INTERNAL FIXATION (ORIF) PROXIMAL HUMERUS FRACTURE;  Surgeon: Leim Fabry, MD;  Location: [facility];  Service: Orthopedics;  Laterality: Left;  . REVERSE SHOULDER ARTHROPLASTY Left 12/27/2016   Procedure: REVERSE SHOULDER ARTHROPLASTY;  Surgeon: Leim Fabry, MD;  Location: [facility];  Service: Orthopedics;  Laterality: Left;    Family History  Problem Relation Age of Onset  . Anxiety disorder Brother   . Obesity Brother   . COPD Brother   . Kidney disease Mother   . Heart attack Father   . Hypertension Father   . Alcohol abuse Father   . Depression Brother   . Anxiety disorder Brother   . Breast cancer Maternal Grandmother     Social  History:  reports that she has been smoking cigarettes. She started smoking about 8 years ago. She has a 1.25 pack-year smoking history. She has never used smokeless tobacco. She reports that she does not drink alcohol or use drugs.  Allergies: No Known Allergies  Medications reviewed.    ROS Full ROS performed and is  otherwise negative other than what is stated in HPI   BP (!) 89/60   Pulse (!) 105   Temp 97.6 F (36.4 C) (Temporal)   Resp 16   Ht 5\' 3"  (1.6 m)   Wt 121 lb (54.9 kg)   LMP  (LMP Unknown)   SpO2 97%   BMI 21.43 kg/m   Physical Exam Vitals signs and nursing note reviewed.  Constitutional:      General: She is not in acute distress.    Appearance: Normal appearance.  Neck:     Musculoskeletal: Normal range of motion and neck supple.  Cardiovascular:     Rate and Rhythm: Normal rate and regular rhythm.  Pulmonary:     Effort: Pulmonary effort is normal.     Breath sounds: No stridor. No rhonchi.  Chest:     Chest wall: No tenderness.  Abdominal:     General: Abdomen is flat. There is no distension.     Palpations: There is no mass.     Tenderness: There is no abdominal tenderness. There is no guarding or rebound.     Hernia: No hernia is present.  Skin:    General: Skin is warm and dry.     Capillary Refill: Capillary refill takes less than 2 seconds.     Coloration: Skin is not jaundiced.  Neurological:     General: No focal deficit present.     Mental Status: She is alert and oriented to person, place, and time.  Psychiatric:        Mood and Affect: Mood normal.        Behavior: Behavior normal.        Thought Content: Thought content normal.        Judgment: Judgment normal.       Assessment/Plan: 68 year old with a giant hiatal hernia with organoaxial Rotation that is causing significant symptoms.  I had a discussion with the patient about the findings.  I do think that she will benefit from a repair of the hiatal hernia and fundoplication.  I have also discussed the case in detail with the radiologist and he does not think that this contractions are consistent with achalasia or any other significant esophageal motility disorder.  This is likely related to her hiatal hernia that is very large. I have discussed with the patient in detail about her disease  process.  I do recommend repair.  I have discussed specifically with the patient about the operative time.  Risk, benefits and possible complications including but not limited to: Bleeding, esophageal perforation, dysphagia, chronic pain and bowel injuries. She understands and she wishes to proceed.  We will plan for robotic hiatal hernia repair and fundoplication during the same time.   Greater than 50% of the 40 minutes  visit was spent in counseling/coordination of care   Caroleen Hamman, MD [facility] [facility]

## 2018-03-01 NOTE — Patient Instructions (Addendum)
The patient is aware to call back for any questions or new concerns.  Discuss with Dr Vicente Males, will call patient back regarding next step to take.  Recommend using protein shakes 3-4 a day

## 2018-03-01 NOTE — Telephone Encounter (Signed)
Jadijah if Trulance not helped in 2 weeks then dry Linzess 290 or Amitiza 24 mcg BID- can add miralax as well. Advise to update after 10 days

## 2018-03-03 ENCOUNTER — Other Ambulatory Visit: Payer: Self-pay

## 2018-03-03 ENCOUNTER — Encounter
Admission: RE | Admit: 2018-03-03 | Discharge: 2018-03-03 | Disposition: A | Discharge status: Home / Self Care | Payer: Federal, State, Local not specified - PPO | Source: Ambulatory Visit | Attending: Surgery | Admitting: Surgery

## 2018-03-03 HISTORY — DX: Personal history of other diseases of the digestive system: Z87.19

## 2018-03-03 HISTORY — DX: Hypotension, unspecified: I95.9

## 2018-03-03 NOTE — Patient Instructions (Signed)
Your procedure is scheduled on: 03-09-18  Report to Same Day Surgery 2nd floor medical mall [facility] Entrance-take elevator on left to 2nd floor.  Check in with surgery information desk.) To find out your arrival time please call [phone] between 1PM - 3PM on 03-08-18  Remember: Instructions that are not followed completely may result in serious medical risk, up to and including death, or upon the discretion of your surgeon and anesthesiologist your surgery may need to be rescheduled.    _x___ 1. Do not eat food after midnight the night before your procedure. You may drink clear liquids up to 2 hours before you are scheduled to arrive at the hospital for your procedure.  Do not drink clear liquids within 2 hours of your scheduled arrival to the hospital.  Clear liquids include  --Water or Apple juice without pulp  --Clear carbohydrate beverage such as ClearFast or Gatorade  --Black Coffee or Clear Tea (No milk, no creamers, do not add anything to the coffee or Tea   ____Ensure clear carbohydrate drink on the way to the hospital for bariatric patients  ____Ensure clear carbohydrate drink 3 hours before surgery for Dr Dwyane Luo patients if physician instructed.   No gum chewing or hard candies.     __x__ 2. No Alcohol for 24 hours before or after surgery.   __x__3. No Smoking or e-cigarettes for 24 prior to surgery.  Do not use any chewable tobacco products for at least 6 hour prior to surgery   ____  4. Bring all medications with you on the day of surgery if instructed.    __x__ 5. Notify your doctor if there is any change in your medical condition     (cold, fever, infections).    x___6. On the morning of surgery brush your teeth with toothpaste and water.  You may rinse your mouth with mouth wash if you wish.  Do not swallow any toothpaste or mouthwash.   Do not wear jewelry, make-up, hairpins, clips or nail polish.  Do not wear lotions, powders, or perfumes. You may wear  deodorant.  Do not shave 48 hours prior to surgery. Men may shave face and neck.  Do not bring valuables to the hospital.    [facility] is not responsible for any belongings or valuables.               Contacts, dentures or bridgework may not be worn into surgery.  Leave your suitcase in the car. After surgery it may be brought to your room.  For patients admitted to the hospital, discharge time is determined by your treatment team.  _  Patients discharged the day of surgery will not be allowed to drive home.  You will need someone to drive you home and stay with you the night of your procedure.    Please read over the following fact sheets that you were given:   [facility] Preparing for Surgery   _x___ TAKE THE FOLLOWING MEDICATION THE MORNING OF SURGERY WITH A SMALL SIP OF WATER. These include:  1. BUSPAR  2. PRILOSEC  3.  4.  5.  6.  ____Fleets enema or Magnesium Citrate as directed.   ____ Use CHG Soap or sage wipes as directed on instruction sheet   ____ Use inhalers on the day of surgery and bring to hospital day of surgery  ____ Stop Metformin and Janumet 2 days prior to surgery.    ____ Take 1/2 of usual insulin dose the night  before surgery and none on the morning surgery.   ____ Follow recommendations from Cardiologist, Pulmonologist or PCP regarding stopping Aspirin, Coumadin, Plavix ,Eliquis, Effient, or Pradaxa, and Pletal.  X____Stop Anti-inflammatories such as Advil, Aleve, Ibuprofen, Motrin, Naproxen, Naprosyn, Goodies powders or aspirin products NOW- OK to take Tylenol    _x___ Stop supplements until after surgery.    ____ Bring C-Pap to the hospital.

## 2018-03-06 ENCOUNTER — Ambulatory Visit: Payer: Federal, State, Local not specified - PPO | Admitting: Psychiatry

## 2018-03-06 ENCOUNTER — Encounter: Payer: Self-pay | Admitting: Psychiatry

## 2018-03-06 ENCOUNTER — Other Ambulatory Visit: Payer: Self-pay

## 2018-03-06 VITALS — BP 65/47 | HR 101 | Temp 97.4°F | Wt 122.2 lb

## 2018-03-06 DIAGNOSIS — F331 Major depressive disorder, recurrent, moderate: Secondary | ICD-10-CM | POA: Diagnosis not present

## 2018-03-06 MED ORDER — TRAZODONE HCL 100 MG PO TABS: 100.0000 mg | ORAL_TABLET | Freq: Every day | ORAL | 2 refills | Status: DC

## 2018-03-06 MED ORDER — PAROXETINE HCL 20 MG PO TABS: 20.0000 mg | ORAL_TABLET | Freq: Every day | ORAL | 1 refills | Status: DC

## 2018-03-06 MED ORDER — BUSPIRONE HCL 10 MG PO TABS: 10.0000 mg | ORAL_TABLET | Freq: Two times a day (BID) | ORAL | 1 refills | Status: DC

## 2018-03-06 NOTE — Progress Notes (Signed)
BH MD/PA/NP OP Progress Note  03/06/2018 1:07 PM Chelsey [name]  MRN:  [id number]  Subjective:  Patient is a 68 year old female who presented for the follow-up appointment. She is very thin and disheveled during the interview.  She reported that she has been having hiatal hernia and not eating well.  She was noted to be burping constantly during the interview.  She reported that she is going for hiatal hernia surgery on Thursday.  Patient reported that she has been feeling better after her shoulder surgery and the pain is gone.  She is not doing any therapy for the same.  Patient was constantly burping throughout the interview.  She reported that she is not eating well and has been losing weight.  There were also some scratches on her right hand as she reported that she has been picking on herself.  She reported that she has some anxiety.  She denied falling at home.  We discussed about her appetite.  She reported that her son is helping her and she is 3 times per day.  However her blood pressure was low.  She reported that she will go home and eat as she did not eat her lunch.  She is able to contract for safety.     She denied having any suicidal homicidal ideations or plans.  She denied having any perceptual disturbances.  She is taking her medications and reported that they are helping her at this time.          Chief Complaint:   Visit Diagnosis:     ICD-10-CM   1. MDD (major depressive disorder), recurrent episode, moderate ([place]) F33.1     Past Medical History:  Past Medical History:  Diagnosis Date  . Breast cancer ([place]) 1997   right breast, radiation  . Bronchitis    recent/ 06/09/15 [facility] chest x[facility]/ [facility]/resolved  . Cancer [facility]) 1997   right lumpectomy,L/Ad/R   . GERD (gastroesophageal reflux disease)   . History of hiatal hernia   . Hypercholesteremia   . Hyperlipidemia   . Low BP    TYPICALLY RUNS 80'S/60'S  . Panic attack   . Personal history of  malignant neoplasm of breast     Past Surgical History:  Procedure Laterality Date  . BICEPT TENODESIS Left 11/23/2016   Procedure: BICEPS TENODESIS;  Surgeon: Leim Fabry, MD;  Location: [facility] ORS;  Service: Orthopedics;  Laterality: Left;  . BREAST EXCISIONAL BIOPSY Right 1997   pos  . BREAST SURGERY Right 1997   lumpectomy  . CHOLECYSTECTOMY N/A 08/06/2016   Procedure: LAPAROSCOPIC CHOLECYSTECTOMY WITH INTRAOPERATIVE CHOLANGIOGRAM;  Surgeon: Christene Lye, MD;  Location: [facility] ORS;  Service: General;  Laterality: N/A;  . COLONOSCOPY  2008  . COLONOSCOPY WITH PROPOFOL N/A 07/21/2015   Procedure: COLONOSCOPY WITH PROPOFOL;  Surgeon: Lucilla Lame, MD;  Location: [place];  Service: Endoscopy;  Laterality: N/A;  . COLONOSCOPY WITH PROPOFOL N/A 01/05/2018   Procedure: COLONOSCOPY WITH PROPOFOL;  Surgeon: Jonathon Bellows, MD;  Location: [facility] ENDOSCOPY;  Service: Gastroenterology;  Laterality: N/A;  . ESOPHAGOGASTRODUODENOSCOPY (EGD) WITH PROPOFOL N/A 06/16/2016   Procedure: ESOPHAGOGASTRODUODENOSCOPY (EGD) WITH PROPOFOL;  Surgeon: Christene Lye, MD;  Location: [facility] ENDOSCOPY;  Service: Endoscopy;  Laterality: N/A;  . ESOPHAGOGASTRODUODENOSCOPY (EGD) WITH PROPOFOL N/A 01/05/2018   Procedure: ESOPHAGOGASTRODUODENOSCOPY (EGD) WITH PROPOFOL;  Surgeon: Jonathon Bellows, MD;  Location: [facility] ENDOSCOPY;  Service: Gastroenterology;  Laterality: N/A;  . HARDWARE REMOVAL Left 12/27/2016   Procedure: HARDWARE REMOVAL LEFT  PROXIMAL HUMEROUS;  Surgeon: Leim Fabry, MD;  Location: [facility]S;  Service: Orthopedics;  Laterality: Left;  . ORIF HUMERUS FRACTURE Left 11/23/2016   Procedure: OPEN REDUCTION INTERNAL FIXATION (ORIF) PROXIMAL HUMERUS FRACTURE;  Surgeon: Leim Fabry, MD;  Location: [facility] ORS;  Service: Orthopedics;  Laterality: Left;  . REVERSE SHOULDER ARTHROPLASTY Left 12/27/2016   Procedure: REVERSE SHOULDER ARTHROPLASTY;  Surgeon: Leim Fabry, MD;  Location: [facility] ORS;  Service:  Orthopedics;  Laterality: Left;   Family History:  Family History  Problem Relation Age of Onset  . Anxiety disorder Brother   . Obesity Brother   . COPD Brother   . Kidney disease Mother   . Heart attack Father   . Hypertension Father   . Alcohol abuse Father   . Depression Brother   . Anxiety disorder Brother   . Breast cancer Maternal Grandmother    Social History:  Social History   Socioeconomic History  . Marital status: Widowed    Spouse name: Not on file  . Number of children: 2  . Years of education: Not on file  . Highest education level: Bachelor's degree (e.g., BA, AB, BS)  Occupational History  . Not on file  Social Needs  . Financial resource strain: Hard  . Food insecurity:    Worry: Never true    Inability: Never true  . Transportation needs:    Medical: No    Non-medical: No  Tobacco Use  . Smoking status: Current Every Day Smoker    Packs/day: 0.25    Years: 10.00    Pack years: 2.50    Types: Cigarettes    Start date: 11/25/2009  . Smokeless tobacco: Never Used  . Tobacco comment: 1 cigarette daily  Substance and Sexual Activity  . Alcohol use: No    Alcohol/week: 0.0 standard drinks  . Drug use: No  . Sexual activity: Never  Lifestyle  . Physical activity:    Days per week: 0 days    Minutes per session: 0 min  . Stress: Very much  Relationships  . Social connections:    Talks on phone: Not on file    Gets together: Not on file    Attends religious service: Never    Active member of club or organization: No    Attends meetings of clubs or organizations: Never    Relationship status: Widowed  Other Topics Concern  . Not on file  Social History Narrative  . Not on file   Additional History:  Currently her son is living with her and patient is trying to help him move out into an apartment. 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  Assessment:   Musculoskeletal: Strength & Muscle Tone: within normal limits Gait  & Station: normal Patient leans: N/A  Psychiatric Specialty Exam: Medication Refill     Review of Systems  Psychiatric/Behavioral: Positive for depression. The patient is nervous/anxious.     There were no vitals taken for this visit.There is no height or weight on file to calculate BMI.  General Appearance: Casual  Eye Contact:  Good  Speech:  Clear and Coherent  Volume:  Normal  Mood:  Anxious, Depressed and Dysphoric  Affect:  Congruent  Thought Process:  Coherent  Orientation:  Full (Time, Place, and Person)  Thought Content:  WDL  Suicidal Thoughts:  No  Homicidal Thoughts:  No  Memory:  Immediate;   Fair  Judgement:  Fair  Insight:  Fair  Psychomotor  Activity:  Decreased  Concentration:  Fair  Recall:  [employer] of Knowledge: Fair  Language: Fair  Akathisia:  No  Handed:  Right  AIMS (if indicated):    Assets:  Communication Skills Desire for Improvement Social Support  ADL's:  Intact  Cognition: WNL  Sleep: 6- 7 with trazodone    Is the patient at risk to self?  No. Has the patient been a risk to self in the past 6 months?  No. Has the patient been a risk to self within the distant past?  No. Is the patient a risk to others?  No. Has the patient been a risk to others in the past 6 months?  No. Has the patient been a risk to others within the distant past?  No.  Current Medications: Current Outpatient Medications  Medication Sig Dispense Refill  . busPIRone (BUSPAR) 10 MG tablet Take 1 tablet (10 mg total) by mouth 2 (two) times daily. 180 tablet 1  . Calcium Carbonate-Vitamin D (CALCIUM + D PO) Take 1 tablet daily by mouth.     . Cholecalciferol (VITAMIN D) 2000 units tablet Take 2,000 Units by mouth daily.    Marland Kitchen ibuprofen (ADVIL,MOTRIN) 200 MG tablet Take 200-400 mg by mouth daily as needed for headache or moderate pain.    . Multiple Vitamin (MULTIVITAMIN) tablet Take 1 tablet daily by mouth.     Marland Kitchen omeprazole (PRILOSEC) 20 MG capsule Take 1 capsule (20  mg total) by mouth 2 (two) times daily before a meal. 180 capsule 3  . PARoxetine (PAXIL) 20 MG tablet Take 1 tablet (20 mg total) by mouth daily before supper. 90 tablet 1  . Peppermint Oil (IBGARD PO) Take 2 tablets by mouth daily before supper.    . Simethicone (GAS-X PO) Take 1-2 tablets by mouth daily as needed (gas).    . simvastatin (ZOCOR) 20 MG tablet TAKE ONE TABLET BY MOUTH AT BEDTIME 90 tablet 1  . traZODone (DESYREL) 100 MG tablet Take 1 tablet (100 mg total) by mouth at bedtime. 90 tablet 2   No current facility-administered medications for this visit.     Medical Decision Making:  Review of Psycho-Social Stressors (1) and Review and summation of old records (2)  Treatment Plan Summary:Medication management   Continue medications as follows    Paxil 20 mg by mouth daily at 3pm  Continue trazodone 100 mg at bedtime. Continue BuSpar 10 mg by mouth twice a day      More than 50% of the time spent in psychoeducation, counseling and coordination of care.   Follow-up in 1 months or earlier depending on her symptoms  This note was generated in part or whole with voice recognition software. Voice regonition is usually quite accurate but there are transcription errors that can and very often do occur. I apologize for any typographical errors that were not detected and corrected.    Rainey Pines, MD    03/06/2018, 1:07 PM

## 2018-03-08 MED ORDER — CEFAZOLIN SODIUM-DEXTROSE 2-4 GM/100ML-% IV SOLN
2.0000 g | INTRAVENOUS | Status: AC
  Administered 2018-03-09: 2 g via INTRAVENOUS

## 2018-03-09 ENCOUNTER — Inpatient Hospital Stay: Payer: Medicare Other

## 2018-03-09 ENCOUNTER — Encounter
Admission: RE | Disposition: A | Discharge status: Home Health | Payer: Self-pay | Source: Ambulatory Visit | Attending: Surgery

## 2018-03-09 ENCOUNTER — Ambulatory Visit: Payer: Self-pay | Admitting: Gastroenterology

## 2018-03-09 ENCOUNTER — Other Ambulatory Visit: Payer: Self-pay

## 2018-03-09 ENCOUNTER — Inpatient Hospital Stay: Payer: Medicare Other | Admitting: Certified Registered"

## 2018-03-09 ENCOUNTER — Inpatient Hospital Stay
Admission: RE | Admit: 2018-03-09 | Discharge: 2018-03-20 | DRG: 327 | Disposition: A | Discharge status: Home Health | Payer: Medicare Other | Source: Ambulatory Visit | Attending: Surgery | Admitting: Surgery

## 2018-03-09 DIAGNOSIS — K219 Gastro-esophageal reflux disease without esophagitis: Secondary | ICD-10-CM | POA: Diagnosis present

## 2018-03-09 DIAGNOSIS — E785 Hyperlipidemia, unspecified: Secondary | ICD-10-CM | POA: Diagnosis present

## 2018-03-09 DIAGNOSIS — Z8719 Personal history of other diseases of the digestive system: Secondary | ICD-10-CM

## 2018-03-09 DIAGNOSIS — Z811 Family history of alcohol abuse and dependence: Secondary | ICD-10-CM | POA: Diagnosis not present

## 2018-03-09 DIAGNOSIS — Z9889 Other specified postprocedural states: Secondary | ICD-10-CM

## 2018-03-09 DIAGNOSIS — Z825 Family history of asthma and other chronic lower respiratory diseases: Secondary | ICD-10-CM | POA: Diagnosis not present

## 2018-03-09 DIAGNOSIS — Z923 Personal history of irradiation: Secondary | ICD-10-CM | POA: Diagnosis not present

## 2018-03-09 DIAGNOSIS — Z5331 Laparoscopic surgical procedure converted to open procedure: Secondary | ICD-10-CM | POA: Diagnosis not present

## 2018-03-09 DIAGNOSIS — K9171 Accidental puncture and laceration of a digestive system organ or structure during a digestive system procedure: Secondary | ICD-10-CM | POA: Diagnosis not present

## 2018-03-09 DIAGNOSIS — Z23 Encounter for immunization: Secondary | ICD-10-CM

## 2018-03-09 DIAGNOSIS — E7800 Pure hypercholesterolemia, unspecified: Secondary | ICD-10-CM | POA: Diagnosis present

## 2018-03-09 DIAGNOSIS — Z818 Family history of other mental and behavioral disorders: Secondary | ICD-10-CM | POA: Diagnosis not present

## 2018-03-09 DIAGNOSIS — Z803 Family history of malignant neoplasm of breast: Secondary | ICD-10-CM

## 2018-03-09 DIAGNOSIS — E43 Unspecified severe protein-calorie malnutrition: Secondary | ICD-10-CM

## 2018-03-09 DIAGNOSIS — S27819A Unspecified injury of esophagus (thoracic part), initial encounter: Secondary | ICD-10-CM

## 2018-03-09 DIAGNOSIS — Z853 Personal history of malignant neoplasm of breast: Secondary | ICD-10-CM

## 2018-03-09 DIAGNOSIS — Y658 Other specified misadventures during surgical and medical care: Secondary | ICD-10-CM | POA: Diagnosis not present

## 2018-03-09 DIAGNOSIS — Z841 Family history of disorders of kidney and ureter: Secondary | ICD-10-CM | POA: Diagnosis not present

## 2018-03-09 DIAGNOSIS — I959 Hypotension, unspecified: Secondary | ICD-10-CM | POA: Diagnosis not present

## 2018-03-09 DIAGNOSIS — Z8249 Family history of ischemic heart disease and other diseases of the circulatory system: Secondary | ICD-10-CM | POA: Diagnosis not present

## 2018-03-09 DIAGNOSIS — K449 Diaphragmatic hernia without obstruction or gangrene: Principal | ICD-10-CM

## 2018-03-09 DIAGNOSIS — F17210 Nicotine dependence, cigarettes, uncomplicated: Secondary | ICD-10-CM | POA: Diagnosis present

## 2018-03-09 DIAGNOSIS — T85598A Other mechanical complication of other gastrointestinal prosthetic devices, implants and grafts, initial encounter: Secondary | ICD-10-CM

## 2018-03-09 HISTORY — PX: ROBOTIC ASSISTED LAPAROSCOPIC REPAIR OF PARAESOPHAGEAL HERNIA: SHX6606

## 2018-03-09 HISTORY — PX: NISSEN FUNDOPLICATION: SHX2091

## 2018-03-09 HISTORY — PX: REPAIR OF ESOPHAGUS: SHX6062

## 2018-03-09 LAB — BASIC METABOLIC PANEL
Anion gap: 7 (ref 5–15)
BUN: 17 mg/dL (ref 8–23)
CO2: 27 mmol/L (ref 22–32)
Calcium: 9 mg/dL (ref 8.9–10.3)
Chloride: 104 mmol/L (ref 98–111)
Creatinine, Ser: 1 mg/dL (ref 0.44–1.00)
GFR calc Af Amer: >60 mL/min (ref >60)
GFR calc non Af Amer: 59 mL/min — ABNORMAL LOW (ref >60)
GLUCOSE: 157 mg/dL — AB (ref 70–99)
Potassium: 4.7 mmol/L (ref 3.5–5.1)
Sodium: 138 mmol/L (ref 135–145)

## 2018-03-09 LAB — CBC
HCT: 40.4 % (ref 36.0–46.0)
Hemoglobin: 13.1 g/dL (ref 12.0–15.0)
MCH: 31 pg (ref 26.0–34.0)
MCHC: 32.4 g/dL (ref 30.0–36.0)
MCV: 95.7 fL (ref 80.0–100.0)
Platelets: 204 10*3/uL (ref 150–400)
RBC: 4.22 MIL/uL (ref 3.87–5.11)
RDW: 12.4 % (ref 11.5–15.5)
WBC: 8.9 10*3/uL (ref 4.0–10.5)
nRBC: 0 % (ref 0.0–0.2)

## 2018-03-09 LAB — GLUCOSE, CAPILLARY: Glucose-Capillary: 206 mg/dL — ABNORMAL HIGH (ref 70–99)

## 2018-03-09 SURGERY — ROBOTIC ASSISTED LAPAROSCOPIC REPAIR OF PARAESOPHAGEAL HERNIA
Anesthesia: General

## 2018-03-09 MED ORDER — NOREPINEPHRINE BITARTRATE 1 MG/ML IV SOLN
INTRAVENOUS | Status: AC
  Filled 2018-03-09: qty 4

## 2018-03-09 MED ORDER — LIDOCAINE HCL (CARDIAC) PF 100 MG/5ML IV SOSY
PREFILLED_SYRINGE | INTRAVENOUS | Status: DC | PRN
  Administered 2018-03-09: 60 mg via INTRAVENOUS

## 2018-03-09 MED ORDER — FENTANYL CITRATE (PF) 100 MCG/2ML IJ SOLN: 25.0000 ug | INTRAMUSCULAR | Status: DC | PRN

## 2018-03-09 MED ORDER — ONDANSETRON HCL 4 MG/2ML IJ SOLN
INTRAMUSCULAR | Status: AC
  Filled 2018-03-09: qty 2

## 2018-03-09 MED ORDER — PNEUMOCOCCAL VAC POLYVALENT 25 MCG/0.5ML IJ INJ
0.5000 mL | INJECTION | INTRAMUSCULAR | Status: AC
  Administered 2018-03-10: 0.5 mL via INTRAMUSCULAR
  Filled 2018-03-09: qty 0.5

## 2018-03-09 MED ORDER — FENTANYL CITRATE (PF) 100 MCG/2ML IJ SOLN
INTRAMUSCULAR | Status: DC | PRN
  Administered 2018-03-09: 25 ug via INTRAVENOUS
  Administered 2018-03-09 (×2): 50 ug via INTRAVENOUS
  Administered 2018-03-09: 25 ug via INTRAVENOUS
  Administered 2018-03-09: 50 ug via INTRAVENOUS

## 2018-03-09 MED ORDER — ONDANSETRON HCL 4 MG/2ML IJ SOLN
INTRAMUSCULAR | Status: DC | PRN
  Administered 2018-03-09: 4 mg via INTRAVENOUS

## 2018-03-09 MED ORDER — ONDANSETRON 4 MG PO TBDP: 4.0000 mg | ORAL_TABLET | Freq: Four times a day (QID) | ORAL | Status: DC | PRN

## 2018-03-09 MED ORDER — CEFAZOLIN SODIUM-DEXTROSE 2-4 GM/100ML-% IV SOLN
INTRAVENOUS | Status: AC
  Filled 2018-03-09: qty 100

## 2018-03-09 MED ORDER — HYDROMORPHONE HCL 1 MG/ML IJ SOLN
INTRAMUSCULAR | Status: DC | PRN
  Administered 2018-03-09 (×2): .2 mg via INTRAVENOUS

## 2018-03-09 MED ORDER — LACTATED RINGERS IV SOLN
INTRAVENOUS | Status: DC
  Administered 2018-03-09 (×4): via INTRAVENOUS

## 2018-03-09 MED ORDER — GABAPENTIN 300 MG PO CAPS
300.0000 mg | ORAL_CAPSULE | ORAL | Status: AC
  Administered 2018-03-09: 300 mg via ORAL

## 2018-03-09 MED ORDER — PANTOPRAZOLE SODIUM 40 MG IV SOLR
40.0000 mg | Freq: Every day | INTRAVENOUS | Status: DC
  Administered 2018-03-09 – 2018-03-19 (×11): 40 mg via INTRAVENOUS
  Filled 2018-03-09 (×11): qty 40

## 2018-03-09 MED ORDER — CHLORHEXIDINE GLUCONATE CLOTH 2 % EX PADS: 6.0000 | MEDICATED_PAD | Freq: Once | CUTANEOUS | Status: DC

## 2018-03-09 MED ORDER — MIDAZOLAM HCL 2 MG/2ML IJ SOLN
INTRAMUSCULAR | Status: AC
  Filled 2018-03-09: qty 2

## 2018-03-09 MED ORDER — PROCHLORPERAZINE EDISYLATE 10 MG/2ML IJ SOLN
5.0000 mg | Freq: Four times a day (QID) | INTRAMUSCULAR | Status: DC | PRN
  Filled 2018-03-09: qty 2

## 2018-03-09 MED ORDER — DEXTROSE IN LACTATED RINGERS 5 % IV SOLN
INTRAVENOUS | Status: DC
  Administered 2018-03-09 – 2018-03-10 (×3): via INTRAVENOUS

## 2018-03-09 MED ORDER — EPHEDRINE SULFATE 50 MG/ML IJ SOLN
INTRAMUSCULAR | Status: DC | PRN
  Administered 2018-03-09 (×3): 10 mg via INTRAVENOUS

## 2018-03-09 MED ORDER — OXYCODONE HCL 5 MG/5ML PO SOLN
5.0000 mg | ORAL | Status: DC | PRN
  Administered 2018-03-09: 5 mg
  Administered 2018-03-10 (×2): 10 mg
  Administered 2018-03-12 – 2018-03-13 (×2): 5 mg
  Administered 2018-03-13 – 2018-03-14 (×4): 10 mg
  Administered 2018-03-15: 5 mg
  Administered 2018-03-15 – 2018-03-20 (×10): 10 mg
  Filled 2018-03-09 (×6): qty 10
  Filled 2018-03-09: qty 5
  Filled 2018-03-09 (×5): qty 10
  Filled 2018-03-09: qty 5
  Filled 2018-03-09 (×2): qty 10
  Filled 2018-03-09: qty 5
  Filled 2018-03-09 (×2): qty 10
  Filled 2018-03-09: qty 5
  Filled 2018-03-09: qty 10
  Filled 2018-03-09: qty 5
  Filled 2018-03-09 (×2): qty 10

## 2018-03-09 MED ORDER — BUPIVACAINE HCL (PF) 0.25 % IJ SOLN
INTRAMUSCULAR | Status: DC | PRN
  Administered 2018-03-09: 30 mL

## 2018-03-09 MED ORDER — ONDANSETRON HCL 4 MG/2ML IJ SOLN: 4.0000 mg | Freq: Once | INTRAMUSCULAR | Status: DC | PRN

## 2018-03-09 MED ORDER — ENOXAPARIN SODIUM 40 MG/0.4ML ~~LOC~~ SOLN
40.0000 mg | SUBCUTANEOUS | Status: DC
  Administered 2018-03-10 – 2018-03-20 (×11): 40 mg via SUBCUTANEOUS
  Filled 2018-03-09 (×11): qty 0.4

## 2018-03-09 MED ORDER — PROCHLORPERAZINE MALEATE 10 MG PO TABS
10.0000 mg | ORAL_TABLET | Freq: Four times a day (QID) | ORAL | Status: DC | PRN
  Filled 2018-03-09: qty 1

## 2018-03-09 MED ORDER — ALBUMIN HUMAN 5 % IV SOLN
INTRAVENOUS | Status: AC
  Filled 2018-03-09: qty 250

## 2018-03-09 MED ORDER — HYDRALAZINE HCL 20 MG/ML IJ SOLN: 10.0000 mg | INTRAMUSCULAR | Status: DC | PRN

## 2018-03-09 MED ORDER — ONDANSETRON HCL 4 MG/2ML IJ SOLN: 4.0000 mg | Freq: Four times a day (QID) | INTRAMUSCULAR | Status: DC | PRN

## 2018-03-09 MED ORDER — LACTATED RINGERS IV BOLUS
500.0000 mL | Freq: Once | INTRAVENOUS | Status: AC
  Administered 2018-03-09: 500 mL via INTRAVENOUS

## 2018-03-09 MED ORDER — EVICEL 5 ML EX KIT
PACK | CUTANEOUS | Status: AC
  Filled 2018-03-09: qty 1

## 2018-03-09 MED ORDER — ROCURONIUM BROMIDE 50 MG/5ML IV SOLN
INTRAVENOUS | Status: AC
  Filled 2018-03-09: qty 1

## 2018-03-09 MED ORDER — DEXAMETHASONE SODIUM PHOSPHATE 10 MG/ML IJ SOLN
INTRAMUSCULAR | Status: DC | PRN
  Administered 2018-03-09: 10 mg via INTRAVENOUS

## 2018-03-09 MED ORDER — PROPOFOL 10 MG/ML IV BOLUS
INTRAVENOUS | Status: AC
  Filled 2018-03-09: qty 20

## 2018-03-09 MED ORDER — SUGAMMADEX SODIUM 200 MG/2ML IV SOLN
INTRAVENOUS | Status: DC | PRN
  Administered 2018-03-09: 200 mg via INTRAVENOUS

## 2018-03-09 MED ORDER — PIPERACILLIN-TAZOBACTAM 3.375 G IVPB
INTRAVENOUS | Status: AC
  Filled 2018-03-09: qty 50

## 2018-03-09 MED ORDER — ROCURONIUM BROMIDE 100 MG/10ML IV SOLN
INTRAVENOUS | Status: DC | PRN
  Administered 2018-03-09: 40 mg via INTRAVENOUS
  Administered 2018-03-09: 10 mg via INTRAVENOUS
  Administered 2018-03-09: 20 mg via INTRAVENOUS
  Administered 2018-03-09 (×3): 10 mg via INTRAVENOUS
  Administered 2018-03-09: 20 mg via INTRAVENOUS
  Administered 2018-03-09: 10 mg via INTRAVENOUS

## 2018-03-09 MED ORDER — OXYCODONE HCL 5 MG PO TABS: 5.0000 mg | ORAL_TABLET | ORAL | Status: DC | PRN

## 2018-03-09 MED ORDER — ACETAMINOPHEN 500 MG PO TABS
1000.0000 mg | ORAL_TABLET | Freq: Four times a day (QID) | ORAL | Status: DC
  Administered 2018-03-09 – 2018-03-10 (×2): 1000 mg via ORAL
  Filled 2018-03-09 (×2): qty 2

## 2018-03-09 MED ORDER — ACETAMINOPHEN 10 MG/ML IV SOLN
INTRAVENOUS | Status: AC
  Filled 2018-03-09: qty 100

## 2018-03-09 MED ORDER — CELECOXIB 200 MG PO CAPS
200.0000 mg | ORAL_CAPSULE | ORAL | Status: AC
  Administered 2018-03-09: 200 mg via ORAL

## 2018-03-09 MED ORDER — METHYLENE BLUE 0.5 % INJ SOLN
INTRAVENOUS | Status: AC
  Filled 2018-03-09: qty 10

## 2018-03-09 MED ORDER — PIPERACILLIN-TAZOBACTAM 3.375 G IVPB
3.3750 g | Freq: Three times a day (TID) | INTRAVENOUS | Status: DC
  Administered 2018-03-09 – 2018-03-20 (×32): 3.375 g via INTRAVENOUS
  Filled 2018-03-09 (×32): qty 50

## 2018-03-09 MED ORDER — HEPARIN SODIUM (PORCINE) 5000 UNIT/ML IJ SOLN
INTRAMUSCULAR | Status: AC
  Filled 2018-03-09: qty 1

## 2018-03-09 MED ORDER — CELECOXIB 200 MG PO CAPS
ORAL_CAPSULE | ORAL | Status: AC
  Filled 2018-03-09: qty 1

## 2018-03-09 MED ORDER — JEVITY 1.2 CAL PO LIQD
1000.0000 mL | ORAL | Status: DC
  Administered 2018-03-09 – 2018-03-10 (×2): 1000 mL

## 2018-03-09 MED ORDER — BUPIVACAINE HCL (PF) 0.25 % IJ SOLN
INTRAMUSCULAR | Status: AC
  Filled 2018-03-09: qty 30

## 2018-03-09 MED ORDER — ACETAMINOPHEN 500 MG PO TABS
ORAL_TABLET | ORAL | Status: AC
  Filled 2018-03-09: qty 2

## 2018-03-09 MED ORDER — ACETAMINOPHEN 10 MG/ML IV SOLN
INTRAVENOUS | Status: DC | PRN
  Administered 2018-03-09: 1000 mg via INTRAVENOUS

## 2018-03-09 MED ORDER — ALBUMIN HUMAN 5 % IV SOLN
INTRAVENOUS | Status: DC | PRN
  Administered 2018-03-09 (×2): via INTRAVENOUS

## 2018-03-09 MED ORDER — MIDAZOLAM HCL 2 MG/2ML IJ SOLN
INTRAMUSCULAR | Status: DC | PRN
  Administered 2018-03-09: 2 mg via INTRAVENOUS

## 2018-03-09 MED ORDER — MORPHINE SULFATE (PF) 4 MG/ML IV SOLN
4.0000 mg | INTRAVENOUS | Status: DC | PRN
  Administered 2018-03-09 – 2018-03-15 (×14): 4 mg via INTRAVENOUS
  Filled 2018-03-09 (×15): qty 1

## 2018-03-09 MED ORDER — SODIUM CHLORIDE (PF) 0.9 % IJ SOLN
INTRAMUSCULAR | Status: AC
  Filled 2018-03-09: qty 50

## 2018-03-09 MED ORDER — KETOROLAC TROMETHAMINE 30 MG/ML IJ SOLN
30.0000 mg | Freq: Four times a day (QID) | INTRAMUSCULAR | Status: DC
  Administered 2018-03-09 – 2018-03-10 (×3): 30 mg via INTRAVENOUS
  Filled 2018-03-09 (×3): qty 1

## 2018-03-09 MED ORDER — SUGAMMADEX SODIUM 500 MG/5ML IV SOLN
INTRAVENOUS | Status: AC
  Filled 2018-03-09: qty 5

## 2018-03-09 MED ORDER — METHYLENE BLUE 0.5 % INJ SOLN
INTRAVENOUS | Status: DC | PRN
  Administered 2018-03-09: 10 mL

## 2018-03-09 MED ORDER — PHENYLEPHRINE HCL 10 MG/ML IJ SOLN
INTRAMUSCULAR | Status: DC | PRN
  Administered 2018-03-09: 200 ug via INTRAVENOUS
  Administered 2018-03-09: 100 ug via INTRAVENOUS

## 2018-03-09 MED ORDER — EVICEL 5 ML EX KIT
PACK | CUTANEOUS | Status: DC | PRN
  Administered 2018-03-09: 5 mL

## 2018-03-09 MED ORDER — FENTANYL CITRATE (PF) 100 MCG/2ML IJ SOLN
INTRAMUSCULAR | Status: AC
  Filled 2018-03-09: qty 2

## 2018-03-09 MED ORDER — PROPOFOL 10 MG/ML IV BOLUS
INTRAVENOUS | Status: DC | PRN
  Administered 2018-03-09: 50 mg via INTRAVENOUS
  Administered 2018-03-09: 120 mg via INTRAVENOUS

## 2018-03-09 MED ORDER — GABAPENTIN 300 MG PO CAPS
ORAL_CAPSULE | ORAL | Status: AC
  Filled 2018-03-09: qty 1

## 2018-03-09 MED ORDER — HYDROMORPHONE HCL 1 MG/ML IJ SOLN
INTRAMUSCULAR | Status: AC
  Filled 2018-03-09: qty 1

## 2018-03-09 MED ORDER — BUPIVACAINE LIPOSOME 1.3 % IJ SUSP
INTRAMUSCULAR | Status: AC
  Filled 2018-03-09: qty 20

## 2018-03-09 MED ORDER — ACETAMINOPHEN 500 MG PO TABS
1000.0000 mg | ORAL_TABLET | ORAL | Status: AC
  Administered 2018-03-09: 1000 mg via ORAL

## 2018-03-09 MED ORDER — SODIUM CHLORIDE 0.9 % IV SOLN
INTRAVENOUS | Status: DC | PRN
  Administered 2018-03-09: 70 mL

## 2018-03-09 MED ORDER — HEPARIN SODIUM (PORCINE) 5000 UNIT/ML IJ SOLN
5000.0000 [IU] | Freq: Once | INTRAMUSCULAR | Status: AC
  Administered 2018-03-09: 5000 [IU] via SUBCUTANEOUS

## 2018-03-09 SURGICAL SUPPLY — 62 items
BULB RESERV EVAC DRAIN JP 100C (MISCELLANEOUS) ×3 IMPLANT
CANISTER SUCT 1200ML W/VALVE (MISCELLANEOUS) ×3 IMPLANT
CANNULA SEALS 8.5MM (CANNULA) ×1
CHLORAPREP W/TINT 26ML (MISCELLANEOUS) ×3 IMPLANT
CORD BIP STRL DISP 12FT (MISCELLANEOUS) ×3 IMPLANT
COVER WAND RF STERILE (DRAPES) IMPLANT
DEFOGGER SCOPE WARMER CLEARIFY (MISCELLANEOUS) ×3 IMPLANT
DRAIN CHANNEL JP 19F (MISCELLANEOUS) ×3 IMPLANT
DRAIN PENROSE 5/8X18 LTX STRL (WOUND CARE) ×3 IMPLANT
DRAPE 3 ARM ACCESS DA VINCI (DRAPES) ×1
DRAPE 3 ARM ACCESS DVNC (DRAPES) ×2 IMPLANT
DRAPE SHEET LG 3/4 BI-LAMINATE (DRAPES) ×6 IMPLANT
ELECT BLADE 6.5 EXT (BLADE) ×3 IMPLANT
ELECT REM PT RETURN 9FT ADLT (ELECTROSURGICAL) ×3
ELECTRODE REM PT RTRN 9FT ADLT (ELECTROSURGICAL) ×2 IMPLANT
EVICEL AIRLESS SPRAY ACCES (MISCELLANEOUS) ×3 IMPLANT
GLOVE BIO SURGEON STRL SZ7 (GLOVE) ×3 IMPLANT
GOWN STRL REUS W/ TWL LRG LVL3 (GOWN DISPOSABLE) ×8 IMPLANT
GOWN STRL REUS W/TWL LRG LVL3 (GOWN DISPOSABLE) ×4
GRASPER SUT TROCAR 14GX15 (MISCELLANEOUS) ×3 IMPLANT
IV NS 1000ML (IV SOLUTION) ×1
IV NS 1000ML BAXH (IV SOLUTION) ×2 IMPLANT
KIT PINK PAD W/HEAD ARE REST (MISCELLANEOUS) ×3
KIT PINK PAD W/HEAD ARM REST (MISCELLANEOUS) ×2 IMPLANT
LABEL OR SOLS (LABEL) ×3 IMPLANT
MESH HERNIA 3X4 RECT PHASIX (Mesh General) ×2 IMPLANT
MESH HERNIA 7X10 RECT PHASIX (Mesh General) ×1 IMPLANT
NEEDLE HYPO 22GX1.5 SAFETY (NEEDLE) ×3 IMPLANT
PACK LAP CHOLECYSTECTOMY (MISCELLANEOUS) ×3 IMPLANT
PAD ABD DERMACEA PRESS 5X9 (GAUZE/BANDAGES/DRESSINGS) ×6 IMPLANT
SEAL CANN 8.5 DVNC (CANNULA) ×2 IMPLANT
SEALER ENDOWRIST ONE VESSEL (MISCELLANEOUS) ×3 IMPLANT
SLEEVE ADV FIXATION 5X100MM (TROCAR) ×3 IMPLANT
SOLUTION ELECTROLUBE (MISCELLANEOUS) ×3 IMPLANT
SPONGE DRAIN TRACH 4X4 STRL 2S (GAUZE/BANDAGES/DRESSINGS) ×3 IMPLANT
SPONGE KITTNER 5P (MISCELLANEOUS) ×3 IMPLANT
SPONGE LAP 18X18 RF (DISPOSABLE) ×3 IMPLANT
SPONGE LAP 4X18 RFD (DISPOSABLE) ×3 IMPLANT
STAPLER SKIN PROX 35W (STAPLE) ×3 IMPLANT
STRAP SAFETY 5IN WIDE (MISCELLANEOUS) ×3 IMPLANT
SUT DVC VLOC 180 0 12IN GS21 (SUTURE) ×6
SUT ETHIBOND 0 MO6 C/R (SUTURE) ×3 IMPLANT
SUT ETHILON 3-0 FS-10 30 BLK (SUTURE) ×9
SUT MNCRL 4-0 (SUTURE) ×1
SUT MNCRL 4-0 27XMFL (SUTURE) ×2
SUT PDS AB 0 CT1 27 (SUTURE) ×6 IMPLANT
SUT SILK 2 0 (SUTURE) ×1
SUT SILK 2 0 SH (SUTURE) ×6 IMPLANT
SUT SILK 2 0SH CR/8 30 (SUTURE) ×12 IMPLANT
SUT SILK 2-0 18XBRD TIE 12 (SUTURE) ×2 IMPLANT
SUT VICRYL 0 AB UR-6 (SUTURE) ×6 IMPLANT
SUT VLOC 90 2/L VL 12 GS22 (SUTURE) ×6 IMPLANT
SUTURE DVC VLC 180 0 12IN GS21 (SUTURE) ×4 IMPLANT
SUTURE EHLN 3-0 FS-10 30 BLK (SUTURE) ×6 IMPLANT
SUTURE MNCRL 4-0 27XMF (SUTURE) ×2 IMPLANT
SYR 30ML LL (SYRINGE) ×6 IMPLANT
SYR 3ML LL SCALE MARK (SYRINGE) ×3 IMPLANT
SYR BULB IRRIG 60ML STRL (SYRINGE) ×3 IMPLANT
TROCAR BALLN GELPORT 12X130M (ENDOMECHANICALS) ×3 IMPLANT
TROCAR XCEL NON-BLD 5MMX100MML (ENDOMECHANICALS) ×3 IMPLANT
TUBE MOSS GAS 18FR (TUBING) ×3 IMPLANT
TUBING EVAC SMOKE HEATED PNEUM (TUBING) ×3 IMPLANT

## 2018-03-09 NOTE — Interval H&P Note (Signed)
History and Physical Interval Note:  03/09/2018 10:20 AM  Chelsey [name]  has presented today for surgery, with the diagnosis of Hiatal Hernia  The various methods of treatment have been discussed with the patient and family. After consideration of risks, benefits and other options for treatment, the patient has consented to  Procedure(s): ROBOTIC HIATAL HERNIA (N/A) as a surgical intervention .  The patient's history has been reviewed, patient examined, no change in status, stable for surgery.  I have reviewed the patient's chart and labs.  Questions were answered to the patient's satisfaction.     [place]

## 2018-03-09 NOTE — Anesthesia Post-op Follow-up Note (Signed)
Anesthesia QCDR form completed.        

## 2018-03-09 NOTE — Anesthesia Procedure Notes (Signed)
Procedure Name: Intubation Date/Time: 03/09/2018 11:10 AM Performed by: Philbert Riser, CRNA Pre-anesthesia Checklist: Patient identified, Emergency Drugs available, Suction available, Patient being monitored and Timeout performed Patient Re-evaluated:Patient Re-evaluated prior to induction Oxygen Delivery Method: Circle system utilized and Simple face mask Preoxygenation: Pre-oxygenation with 100% oxygen Induction Type: IV induction Ventilation: Mask ventilation without difficulty Laryngoscope Size: Mac and 3 Grade View: Grade I Tube type: Oral Number of attempts: 1 Airway Equipment and Method: Stylet Placement Confirmation: ETT inserted through vocal cords under direct vision,  positive ETCO2 and breath sounds checked- equal and bilateral Secured at: 22 cm Tube secured with: Tape Dental Injury: Teeth and Oropharynx as per pre-operative assessment

## 2018-03-09 NOTE — Op Note (Addendum)
Attempted robotic assisted Type III hernia repair Open Hiatal hernia repair with repair of Ge junction perforation and Nissen fundoplications Placement of Gastrojejunostomy   Pre-operative Diagnosis: Symptomatic Type III hiatal hernia  Post-operative Diagnosis: samr  Procedure:  Attempted robotic assisted Type III hernia repair Open Hiatal hernia repair with repair of GE junction perforation and Nissen fundoplication Placement of Gastrojejunostomy  Surgeon: Caroleen Hamman, MD FACS  Anesthesia: Gen. with endotracheal tube  Findings: Giant type III hiatal hernia w stomach within mediastinum Esophageal perforation with immediate repair and conversion to open No evidence of leak of the repair after using methylene blue Tip of the gastroj at the ligament of treitz Very friable and thin esophageal wall  Estimated Blood Loss: 150cc         Assistant: Dr. Genevive Bi required due to the complexity and for exposure         2nd Asst. Mr Olean Ree [facility]         Complications: GE junction perforation   Procedure Details  The patient was seen again in the Holding Room. The benefits, complications, treatment options, and expected outcomes were discussed with the patient. The risks of bleeding, infection, recurrence of symptoms, failure to resolve symptoms, esophageal injury, bowel injury, any of which could require further surgery  were reviewed with the patient. The likelihood of improving the patient's symptoms with return to their baseline status is good.  The patient and/or family concurred with the proposed plan, giving informed consent.  The patient was taken to Operating Room, identified as Aidyn Sportsman and the procedure verified . A Time Out was held and the above information confirmed.  Prior to the induction of general anesthesia, antibiotic prophylaxis was administered. VTE prophylaxis was in place. General endotracheal anesthesia was then administered and tolerated well. After the induction, the  abdomen was prepped with Chloraprep and draped in the sterile fashion. The patient was positioned in the supine position.  Cut down technique was used to enter the abdominal cavity and a Hasson trochar was placed after two vicryl stitches were anchored to the fascia. Pneumoperitoneum was then created with CO2 and tolerated well without any adverse changes in the patient's vital signs.  Three 8-mm ports were placed under direct vision. All skin incisions  were infiltrated with a local anesthetic agent before making the incision and placing the trocars. An additional 5 mm port was placed on the LLQ.  The patient was positioned  in reverse Trendelenburg,  Anesthesia placed 48 FR bougie and I watched it under laparoscopic visualization. Therobot was brought to the surgical field and docked in the standard fashion.  We made sure all the instrumentation was kept indirect view at all times and that there were no collision between the arms. I scrubbed out and went to the console.  We retracted the liver and reduce the stomach into the abdominal cavity. Short gastric vessels were divided with vessel sealer, I was able to identify the Left crus which was very debilitated. We continue to perform our dissection the reduce the hernia and bring the rest of the curvature down to the abdominal cavity. Attention was then turned to the pars flacida. THe lesser omentum was divided and the right crus was identified. We continue the dissection circumferentially until the GE junction was brought to the abdominal cavity. This was a very tedious dissection and was extremely careful not toinjury the Aorta or any other vitals structures. Once we had good esophageal dissection and length The posterior crus was approximated w  2-0 V lock. The anterior aspect was suture as well since the defect was massive.  While I was mobilizing the esophagus I visualized the bougie pierced through the GE junction. I di not placed any retraction or  any energy around the junction. Looking back the Bougie may had moved and created torque . More importantly her GE junction and esophagus was very friable and thin. At this point I thought the safest thing was to do a repair and conversion to open. Dr. Genevive Bi was already in the room since I anticipated that this was going to be a very complex case All the robotic instruments were removed and the robot was undocked.  An upper midline laparotomy was performed. Bookwalter was placed and we visualized the perforation. Using the bougie as a guide the perforation was fixed in a two layer fashion with interrupted 2-0 silks.  We removed the Bougie and replace it with an NGT. A 767 fundoplication was performed with interrupted silks and to reinforce the repair. A 10x7cm Phasix St mesh was placed to reinforce our hiatal hernia repairand evaseal was used to mesh to the crus.  Methylene blue was given via the ngt, there was no evidence of extravasation. I decided to perform a gastrostomy tube to pex the stomach and to provide enteral nutrition.  Two purse string silks sutures wer placed on the anterior surface of the stomach, the gastotomy was created and  A 18 Fr  Moss gastrojejunostomy tube was placed. The tip was placed at the ligament of treitz. The ballon was inflated and the tube was secure. A 19 Fr blake drain as well as some omentum and evaseal was placed around the repair.  No bleeding, bile duct injury or leak, or bowel injury was noted.  Exparel was used to infiltrate the wound and the midline was closed with two running 0 PDS sutures. Skin closed with staples.    The patient was then extubated and brought to the recovery room in stable condition. Sponge, lap, and needle counts were correct at closure and at the conclusion of the case.               Caroleen Hamman, MD, FACS

## 2018-03-09 NOTE — Anesthesia Postprocedure Evaluation (Signed)
Anesthesia Post Note  Patient: Chelsey [name]  Procedure(s) Performed: ROBOTIC HIATAL HERNIA CONVERTED TO OPEN (N/A ) NISSEN FUNDOPLICATION (N/A ) GASTROSTOMY TUBE (N/A ) REPAIR OF ESOPHAGUS  Patient location during evaluation: PACU Anesthesia Type: General Level of consciousness: awake and alert Pain management: pain level controlled Vital Signs Assessment: post-procedure vital signs reviewed and stable Respiratory status: spontaneous breathing, nonlabored ventilation, respiratory function stable and patient connected to nasal cannula oxygen Cardiovascular status: blood pressure returned to baseline and stable Postop Assessment: no apparent nausea or vomiting Anesthetic complications: no     Last Vitals:  Vitals:   03/09/18 1837 03/09/18 1900  BP: 128/80 137/83  Pulse: 83 87  Resp: 14 18  Temp: 36.8 C 36.9 C  SpO2: 99% 98%    Last Pain:  Vitals:   03/09/18 1900  TempSrc: Oral  PainSc:                  Denym Rahimi S

## 2018-03-09 NOTE — Anesthesia Preprocedure Evaluation (Signed)
Anesthesia Evaluation  Patient identified by MRN, date of birth, ID band Patient awake    Reviewed: Allergy & Precautions, H&P , NPO status , Patient's Chart, lab work & pertinent test results, reviewed documented beta blocker date and time   Airway Mallampati: II  TM Distance: >3 FB Neck ROM: full    Dental  (+) Teeth Intact   Pulmonary neg pulmonary ROS, Current Smoker,    Pulmonary exam normal        Cardiovascular Exercise Tolerance: Poor negative cardio ROS Normal cardiovascular exam Rhythm:regular Rate:Normal     Neuro/Psych PSYCHIATRIC DISORDERS Anxiety Depression negative neurological ROS     GI/Hepatic negative GI ROS, Neg liver ROS, hiatal hernia, GERD  Medicated,  Endo/Other  negative endocrine ROS  Renal/GU negative Renal ROS  negative genitourinary   Musculoskeletal   Abdominal   Peds  Hematology negative hematology ROS (+) Blood dyscrasia, anemia ,   Anesthesia Other Findings Past Medical History: 1997: Breast cancer ([place])     Comment:  right breast, radiation No date: Bronchitis     Comment:  recent/ 06/09/15 [facility]               [facility]/resolved 1997: Cancer [facility])     Comment:  right lumpectomy,L/Ad/R  No date: GERD (gastroesophageal reflux disease) No date: History of hiatal hernia No date: Hypercholesteremia No date: Hyperlipidemia No date: Low BP     Comment:  TYPICALLY RUNS 80'S/60'S No date: Panic attack No date: Personal history of malignant neoplasm of breast Past Surgical History: 11/23/2016: BICEPT TENODESIS; Left     Comment:  Procedure: BICEPS TENODESIS;  Surgeon: Leim Fabry, MD;              Location: [facility] ORS;  Service: Orthopedics;  Laterality:               Left; 1997: BREAST EXCISIONAL BIOPSY; Right     Comment:  pos 1997: BREAST SURGERY; Right     Comment:  lumpectomy 08/06/2016: CHOLECYSTECTOMY; N/A     Comment:  Procedure: LAPAROSCOPIC  CHOLECYSTECTOMY WITH               INTRAOPERATIVE CHOLANGIOGRAM;  Surgeon: Christene Lye, MD;  Location: [facility] ORS;  Service:               General;  Laterality: N/A; 2008: COLONOSCOPY 07/21/2015: COLONOSCOPY WITH PROPOFOL; N/A     Comment:  Procedure: COLONOSCOPY WITH PROPOFOL;  Surgeon: Lucilla Lame, MD;  Location: [place];  Service:               Endoscopy;  Laterality: N/A; 01/05/2018: COLONOSCOPY WITH PROPOFOL; N/A     Comment:  Procedure: COLONOSCOPY WITH PROPOFOL;  Surgeon: Jonathon Bellows, MD;  Location: [facility] ENDOSCOPY;  Service:               Gastroenterology;  Laterality: N/A; 06/16/2016: ESOPHAGOGASTRODUODENOSCOPY (EGD) WITH PROPOFOL; N/A     Comment:  Procedure: ESOPHAGOGASTRODUODENOSCOPY (EGD) WITH               PROPOFOL;  Surgeon: Christene Lye, MD;                Location: [facility] ENDOSCOPY;  Service: Endoscopy;  Laterality: N/A; 01/05/2018: ESOPHAGOGASTRODUODENOSCOPY (EGD) WITH PROPOFOL; N/A     Comment:  Procedure: ESOPHAGOGASTRODUODENOSCOPY (EGD) WITH               PROPOFOL;  Surgeon: Jonathon Bellows, MD;  Location: [facility]               ENDOSCOPY;  Service: Gastroenterology;  Laterality: N/A; 12/27/2016: HARDWARE REMOVAL; Left     Comment:  Procedure: HARDWARE REMOVAL LEFT  PROXIMAL HUMEROUS;                Surgeon: Leim Fabry, MD;  Location: [facility] ORS;  Service:              Orthopedics;  Laterality: Left; 11/23/2016: ORIF HUMERUS FRACTURE; Left     Comment:  Procedure: OPEN REDUCTION INTERNAL FIXATION (ORIF)               PROXIMAL HUMERUS FRACTURE;  Surgeon: Leim Fabry, MD;                Location: [facility] OR[facility];  Service: Orthopedics;  Laterality:               Left; 12/27/2016: REVERSE SHOULDER ARTHROPLASTY; Left     Comment:  Procedure: REVERSE SHOULDER ARTHROPLASTY;  Surgeon:               Leim Fabry, MD;  Location: [facility] ORS;  Service:               Orthopedics;  Laterality: Left; BMI     Body Mass Index:  21.61 kg/m     Reproductive/Obstetrics negative OB ROS                             Anesthesia Physical Anesthesia Plan  ASA: III  Anesthesia Plan: General ETT   Post-op Pain Management:    Induction:   PONV Risk Score and Plan:   Airway Management Planned:   Additional Equipment:   Intra-op Plan:   Post-operative Plan:   Informed Consent: I have reviewed the patients History and Physical, chart, labs and discussed the procedure including the risks, benefits and alternatives for the proposed anesthesia with the patient or authorized representative who has indicated his/her understanding and acceptance.     Dental Advisory Given  Plan Discussed with: CRNA  Anesthesia Plan Comments: (She has a hx of low blood pressure.  Pressure today is 90 systolic and above her baseline.  She is asymptomatic and I believe ok to proceed.  JA)        Anesthesia Quick Evaluation

## 2018-03-09 NOTE — Progress Notes (Signed)
Anticoagulation monitoring(Lovenox):   68 yo female ordered Lovenox 30 mg Q24h  Filed Weights   03/09/18 0917  Weight: 122 lb (55.3 kg)   BMI    Lab Results  Component Value Date   CREATININE 1.00 03/09/2018   CREATININE 0.71 01/28/2018   CREATININE 0.70 10/17/2017   Estimated Creatinine Clearance: 45.8 mL/min (by C-G formula based on SCr of 1 mg/dL). Hemoglobin & Hematocrit     Component Value Date/Time   HGB 13.1 03/09/2018 1841   HGB 15.3 07/27/2017 1129   HCT 40.4 03/09/2018 1841   HCT 46.2 07/27/2017 1129     Per Protocol for Patient with estCrcl > 30 ml/min and BMI < 40, will transition to Lovenox 40 mg Q24h.

## 2018-03-09 NOTE — Transfer of Care (Signed)
Immediate Anesthesia Transfer of Care Note  Patient: Chelsey [name]  Procedure(s) Performed: ROBOTIC HIATAL HERNIA CONVERTED TO OPEN (N/A ) NISSEN FUNDOPLICATION (N/A ) GASTROSTOMY TUBE (N/A ) REPAIR OF ESOPHAGUS  Patient Location: PACU  Anesthesia Type:General  Level of Consciousness: sedated  Airway & Oxygen Therapy: Patient Spontanous Breathing and Patient connected to face mask oxygen  Post-op Assessment: Report given to RN and Post -op Vital signs reviewed and stable  Post vital signs: Reviewed and stable  Last Vitals:  Vitals Value Taken Time  BP 117/72 03/09/2018  5:12 PM  Temp    Pulse 92 03/09/2018  5:16 PM  Resp 10 03/09/2018  5:16 PM  SpO2 100 % 03/09/2018  5:16 PM  Vitals shown include unvalidated device data.  Last Pain:  Vitals:   03/09/18 0917  TempSrc: Temporal  PainSc: 0-No pain         Complications: No apparent anesthesia complications

## 2018-03-09 NOTE — OR Nursing (Signed)
Bolus complete: BP 103/66, Dr. Andree Elk notified, no new orders

## 2018-03-10 ENCOUNTER — Encounter: Payer: Self-pay | Admitting: Surgery

## 2018-03-10 LAB — BASIC METABOLIC PANEL
Anion gap: 4 — ABNORMAL LOW (ref 5–15)
BUN: 16 mg/dL (ref 8–23)
CALCIUM: 8.5 mg/dL — AB (ref 8.9–10.3)
CO2: 29 mmol/L (ref 22–32)
CREATININE: 0.9 mg/dL (ref 0.44–1.00)
Chloride: 104 mmol/L (ref 98–111)
GFR calc Af Amer: >60 mL/min (ref >60)
GFR calc non Af Amer: >60 mL/min (ref >60)
Glucose, Bld: 146 mg/dL — ABNORMAL HIGH (ref 70–99)
Potassium: 4.6 mmol/L (ref 3.5–5.1)
Sodium: 137 mmol/L (ref 135–145)

## 2018-03-10 LAB — CBC
HCT: 35.8 % — ABNORMAL LOW (ref 36.0–46.0)
Hemoglobin: 11.8 g/dL — ABNORMAL LOW (ref 12.0–15.0)
MCH: 31.5 pg (ref 26.0–34.0)
MCHC: 33 g/dL (ref 30.0–36.0)
MCV: 95.5 fL (ref 80.0–100.0)
Platelets: 182 10*3/uL (ref 150–400)
RBC: 3.75 MIL/uL — ABNORMAL LOW (ref 3.87–5.11)
RDW: 12.4 % (ref 11.5–15.5)
WBC: 9.5 10*3/uL (ref 4.0–10.5)
nRBC: 0 % (ref 0.0–0.2)

## 2018-03-10 LAB — GLUCOSE, CAPILLARY
GLUCOSE-CAPILLARY: 108 mg/dL — AB (ref 70–99)
Glucose-Capillary: 167 mg/dL — ABNORMAL HIGH (ref 70–99)
Glucose-Capillary: 128 mg/dL — ABNORMAL HIGH (ref 70–99)
Glucose-Capillary: 118 mg/dL — ABNORMAL HIGH (ref 70–99)
Glucose-Capillary: 72 mg/dL (ref 70–99)
Glucose-Capillary: 92 mg/dL (ref 70–99)

## 2018-03-10 LAB — PHOSPHORUS: Phosphorus: 3.4 mg/dL (ref 2.5–4.6)

## 2018-03-10 LAB — MAGNESIUM: Magnesium: 1.9 mg/dL (ref 1.7–2.4)

## 2018-03-10 MED ORDER — PAROXETINE HCL 20 MG PO TABS
20.0000 mg | ORAL_TABLET | Freq: Every day | ORAL | Status: DC
  Administered 2018-03-10 – 2018-03-20 (×11): 20 mg via JEJUNOSTOMY
  Filled 2018-03-10 (×11): qty 1

## 2018-03-10 MED ORDER — PAROXETINE HCL 20 MG PO TABS
20.0000 mg | ORAL_TABLET | Freq: Every day | ORAL | Status: DC
  Filled 2018-03-10: qty 1

## 2018-03-10 MED ORDER — ACETAMINOPHEN 160 MG/5ML PO SOLN
1000.0000 mg | Freq: Four times a day (QID) | ORAL | Status: DC
  Administered 2018-03-10 – 2018-03-20 (×24): 1000 mg
  Filled 2018-03-10 (×43): qty 40.6

## 2018-03-10 MED ORDER — KETOROLAC TROMETHAMINE 30 MG/ML IJ SOLN
15.0000 mg | Freq: Four times a day (QID) | INTRAMUSCULAR | Status: DC
  Administered 2018-03-10 – 2018-03-12 (×8): 15 mg via INTRAVENOUS
  Filled 2018-03-10 (×8): qty 1

## 2018-03-10 MED ORDER — FREE WATER
30.0000 mL | Status: DC
  Administered 2018-03-10 – 2018-03-14 (×22): 30 mL

## 2018-03-10 MED ORDER — OSMOLITE 1.2 CAL PO LIQD
1000.0000 mL | ORAL | Status: DC
  Administered 2018-03-10: 1000 mL

## 2018-03-10 MED ORDER — TRAZODONE HCL 100 MG PO TABS
100.0000 mg | ORAL_TABLET | Freq: Every day | ORAL | Status: DC
  Administered 2018-03-10 – 2018-03-19 (×10): 100 mg via JEJUNOSTOMY
  Filled 2018-03-10 (×10): qty 1

## 2018-03-10 MED ORDER — PAROXETINE HCL 10 MG/5ML PO SUSP
20.0000 mg | Freq: Every day | ORAL | Status: DC
  Filled 2018-03-10: qty 10

## 2018-03-10 MED ORDER — OSMOLITE 1.2 CAL PO LIQD
1000.0000 mL | ORAL | Status: DC
  Administered 2018-03-10 – 2018-03-12 (×3): 1000 mL

## 2018-03-10 MED ORDER — BUSPIRONE HCL 10 MG PO TABS
10.0000 mg | ORAL_TABLET | Freq: Two times a day (BID) | ORAL | Status: DC
  Administered 2018-03-10 – 2018-03-20 (×21): 10 mg via JEJUNOSTOMY
  Filled 2018-03-10 (×22): qty 1

## 2018-03-10 MED ORDER — ALUM & MAG HYDROXIDE-SIMETH 200-200-20 MG/5ML PO SUSP
30.0000 mL | ORAL | Status: DC | PRN
  Administered 2018-03-10: 30 mL via ORAL
  Filled 2018-03-10: qty 30

## 2018-03-10 MED ORDER — PHENOL 1.4 % MT LIQD
1.0000 | OROMUCOSAL | Status: DC | PRN
  Administered 2018-03-10: 1 via OROMUCOSAL
  Filled 2018-03-10: qty 177

## 2018-03-10 NOTE — Evaluation (Signed)
Physical Therapy Evaluation Patient Details Name: Chelsey [name] MRN: [id number] DOB: [birth year] Today's Date: 03/10/2018   History of Present Illness  68 y.o. female who is overall doing well receiving enteral feedings 1 Day Post-Op s/p open hital hernia repair and GE junction perforation repair with nissan fundoplication and placement of gastrojejnostomy tube. PMH of breast cancer, GERD, HLD, panic disorder    Clinical Impression  Patient agreeable to PT, reported some pain in abdomen, that increased with bed mobility. Patient reported that she lives in a one story home with her son, previously independent in ADLs/IADLs, does not use an AD for mobility. Stated her son and her sister would be available to assist as needed at discharge.  RN in room to address NGT prior to mobilization. Patient able to perform supine to sit with supervision, would benefit from instruction in log rolling technique. BP assessed in sitting due to complaints of light headedness, 99/66, and patient reported symptoms resolved prior to further mobility. Sit <> stand with CGA and handheld assist. Patient ambulated ~169ft with IV pole and handheld assist, mostly steady with decreased step length and gait velocity.  Overall the patient demonstrated deficits (see "PT Problem List") that impede the patient's functional abilities, safety, and mobility and would benefit from skilled PT intervention. Recommendation is HHPT with supervision for mobility/OOB for safety concerns.      Follow Up Recommendations Home health PT;Supervision for mobility/OOB    Equipment Recommendations  None recommended by PT    Recommendations for Other Services       Precautions / Restrictions Precautions Precautions: Fall Precaution Comments: NGT, gastrojejnostomy tube Restrictions Weight Bearing Restrictions: No      Mobility  Bed Mobility Overal bed mobility: Needs Assistance Bed Mobility: Supine to Sit     Supine to sit:  Supervision     General bed mobility comments: verbal cues for sequencing, attempted to instruct pt in log rolling technique but patient sat up quickly, with complaints of pain. would benefit from further instruction  Transfers Overall transfer level: Needs assistance   Transfers: Sit to/from Stand Sit to Stand: Min assist;+2 safety/equipment            Ambulation/Gait Ambulation/Gait assistance: +2 safety/equipment;Min guard   Assistive device: 1 person hand held assist;IV Pole   Gait velocity: decreased   General Gait Details: Pt demonstrated difficulty with ambulation and head turns, reported dizziness with turning head, Pt minAx1 to correct balance. overall steady besides that one instance.  Stairs            Wheelchair Mobility    Modified Rankin (Stroke Patients Only)       Balance Overall balance assessment: Needs assistance Sitting-balance support: Feet supported Sitting balance-Leahy Scale: Good       Standing balance-Leahy Scale: Fair                               Pertinent Vitals/Pain Pain Assessment: Faces Faces Pain Scale: Hurts little more Pain Location: sitting up in bed Pain Descriptors / Indicators: Grimacing;Guarding;Moaning Pain Intervention(s): Limited activity within patient's tolerance;Monitored during session;Repositioned    Home Living Family/patient expects to be discharged to:: Private residence Living Arrangements: Children;Other relatives Available Help at Discharge: Available 24 hours/day;Family Type of Home: House Home Access: Stairs to enter Entrance Stairs-Rails: None Entrance Stairs-Number of Steps: 2 Home Layout: One level Home Equipment: Bedside commode;Cane - single point      Prior Function Level of  Independence: Independent         Comments: independent in ADLs/ambulation, drives son to grocery store who does the actual shopping. Limited in function due to pain and some LE weakness prior to  surgery. Reported 1 fall that involved pt tripping over something in her home.      Hand Dominance   Dominant Hand: Left    Extremity/Trunk Assessment   Upper Extremity Assessment Upper Extremity Assessment: Defer to OT evaluation;Overall [facility] for tasks assessed    Lower Extremity Assessment Lower Extremity Assessment: Generalized weakness    Cervical / Trunk Assessment Cervical / Trunk Assessment: Normal  Communication   Communication: No difficulties  Cognition Arousal/Alertness: Awake/alert Behavior During Therapy: WFL for tasks assessed/performed Overall Cognitive Status: Within Functional Limits for tasks assessed                                        General Comments      Exercises General Exercises - Lower Extremity Ankle Circles/Pumps: AROM;Both;10 reps Quad Sets: AROM;Both;10 reps Gluteal Sets: AROM;Both;10 reps Heel Slides: AROM;Both;10 reps Hip ABduction/ADduction: AROM;Both;10 reps   Assessment/Plan    PT Assessment Patient needs continued PT services  PT Problem List Decreased strength;Decreased range of motion;Decreased activity tolerance;Decreased balance;Decreased mobility;Decreased knowledge of precautions;Decreased safety awareness       PT Treatment Interventions DME instruction;Therapeutic exercise;Gait training;Balance training;Stair training;Neuromuscular re-education;Therapeutic activities;Patient/family education    PT Goals (Current goals can be found in the Care Plan section)  Acute Rehab PT Goals Patient Stated Goal: To return home, decrease pain, get stronger PT Goal Formulation: With patient Time For Goal Achievement: 03/24/18 Potential to Achieve Goals: Good    Frequency Min 2X/week   Barriers to discharge        Co-evaluation               AM-PAC PT "6 Clicks" Mobility  Outcome Measure Help needed turning from your back to your side while in a flat bed without using bedrails?: A Little Help needed  moving from lying on your back to sitting on the side of a flat bed without using bedrails?: A Little Help needed moving to and from a bed to a chair (including a wheelchair)?: A Little Help needed standing up from a chair using your arms (e.g., wheelchair or bedside chair)?: A Little Help needed to walk in hospital room?: A Little Help needed climbing 3-5 steps with a railing? : A Little 6 Click Score: 18    End of Session Equipment Utilized During Treatment: Gait belt Activity Tolerance: Patient tolerated treatment well Patient left: with chair alarm set;in chair;with family/visitor present;with call bell/phone within reach(pt requested break from SCD's encouraged to wear as much as possible) Nurse Communication: Mobility status PT Visit Diagnosis: Other abnormalities of gait and mobility (R26.89);Muscle weakness (generalized) (M62.81);Difficulty in walking, not elsewhere classified (R26.2)    Time: 5638-7564 PT Time Calculation (min) (ACUTE ONLY): 53 min   Charges:   PT Evaluation $PT Eval Moderate Complexity: 1 Mod PT Treatments $Therapeutic Exercise: 8-22 mins $Therapeutic Activity: 23-37 mins      Lieutenant Diego PT, DPT 3:37 PM,03/10/18 [phone]

## 2018-03-10 NOTE — Progress Notes (Signed)
Initial Nutrition Assessment  DOCUMENTATION CODES:   Severe malnutrition in context of acute illness/injury  INTERVENTION:   Osmolite 1.2 @ goal rate of 27m/hr- Initiate at 419mhr and increase by 1033mr q8 hours until goal rate is reached.  Free water flushes 74m46m hours to maintain tube patency   Regimen provides 1728kcal/day, 80g/day protein, 1361ml73m free water  Pt at high refeed risk; recommend monitor K, Mg and P labs daily until stable  Tube feeds at goal rate will meet 100% of pt's micro and macronutrient requirements, including free water.  NUTRITION DIAGNOSIS:   Severe Malnutrition related to altered GI function(Hiatal hernia ) as evidenced by 9 percent weight loss in 2 months, mild fat depletion, moderate to severe muscle depletions.  GOAL:   Patient will meet greater than or equal to 90% of their needs  MONITOR:   Labs, Weight trends, TF tolerance, Skin, I & O's  REASON FOR ASSESSMENT:   Consult Enteral/tube feeding initiation and management  ASSESSMENT:   67 y.63 female s/p open hital hernia repair and GE junction perforation repair with nissan fundoplication and placement of gastrojejnostomy tube 2/13   Met with pt in room today. Pt reports decreased appetite and oral intake for several months pta r/t hiatal hernia. Pt reports she has mainly been eating soft foods once daily. Pt reports eating mostly yogurt and cereals. Pt has been drinking some Ensure at home. Pt reports her UBW is around ~185lbs; pt reports she last weighed this in 2018. Per chart, pt has lost 49lbs(29%) over the past year and 12lbs(9%) over the past 2 months. This is significant weight loss. Pt s/p [employer] yesterday. Pt had an 18 Fr67Moss gastrojejunostomy tube placed to the ligament of treitz. Pt initiated on Jevity overnight and tolerated well. RD will switch pt to Osmolite as Jevity is high in fiber. Pt reports she is tolerating tube feeds well. Pt reports pain around her incision site  but no gastric pain. Pt is reporting belching today. No BM in 5-6 days per pt reports. RD will continue to monitor for signs of GI intolerance/ileus. Suspect pt at refeed risk; recommend monitor K, Mg and P labs daily until stable.   Medications reviewed and include: lovenox, protonix, LRS w/ 5% dextrose @50ml /hr, zosyn, maalox, morphine   Labs reviewed: K 4.6 wnl, P 3.4 wnl, Mg 1.9 wnl cbgs- 167, 128, 118, 108 x 24hrs  NUTRITION - FOCUSED PHYSICAL EXAM:    Most Recent Value  Orbital Region  Mild depletion  Upper Arm Region  Mild depletion  Thoracic and Lumbar Region  Mild depletion  Buccal Region  Mild depletion  Temple Region  Moderate depletion  Clavicle Bone Region  Severe depletion  Clavicle and Acromion Bone Region  Severe depletion  Scapular Bone Region  Moderate depletion  Dorsal Hand  Severe depletion  Patellar Region  Severe depletion  Anterior Thigh Region  Moderate depletion  Posterior Calf Region  Moderate depletion  Edema (RD Assessment)  None  Hair  Reviewed  Eyes  Reviewed  Mouth  Reviewed  Skin  Reviewed  Nails  Reviewed     Diet Order:   Diet Order            Diet NPO time specified  Diet effective midnight             EDUCATION NEEDS:   Education needs have been addressed  Skin:  Skin Assessment: Reviewed RN Assessment(incision abdomen )  Last BM:  pta  Height:  Ht Readings from Last 1 Encounters:  03/09/18 5' 3"  (1.6 m)    Weight:   Wt Readings from Last 1 Encounters:  03/09/18 55.3 kg    Ideal Body Weight:  52.3 kg  BMI:  Body mass index is 21.61 kg/m.  Estimated Nutritional Needs:   Kcal:  1400-1600kcal/day   Protein:  72-83g/day   Fluid:  1.3L/day   Koleen Distance MS, RD, LDN Pager #- [phone] Office#- [phone] After Hours Pager: [phone]

## 2018-03-10 NOTE — Progress Notes (Signed)
Patient is concerned about not getting some of her home meds, order received from Dr pabon for trazodone, paxil, and buspar

## 2018-03-10 NOTE — Progress Notes (Signed)
[facility] Day(s): 1.   Post op day(s): 1 Day Post-Op.   Interval History: Patient seen and examined, no acute events or new complaints overnight. Patient reports abdominal pain near her incision. No complaints of fever, chills, nausea, or emesis. Undergoing tube feeding through gastrojejunostomy tube. NGT with about 200 ccs out. Making good urine.   Review of Systems:  Constitutional: denies fever, chills  Gastrointestinal: + abdominal pain, denied N/V, or diarrhea/and bowel function as per interval history Integumentary: denies any other rashes or skin discolorations except surgical incions  Vital signs in last 24 hours: [min-max] current  Temp:  [97.1 F (36.2 C)-99 F (37.2 C)] 98.9 F (37.2 C) (02/14 0443) Pulse Rate:  [62-96] 66 (02/14 0443) Resp:  [9-18] 18 (02/14 0443) BP: (75-138)/(59-86) 97/61 (02/14 0443) SpO2:  [96 %-100 %] 100 % (02/14 0443) Weight:  [55.3 kg] 55.3 kg (02/13 0917)     Height: 5\' 3"  (160 cm) Weight: 55.3 kg BMI (Calculated): 21.62   Intake/Output this shift:  No intake/output data recorded.   Intake/Output last 2 shifts:  @IOLAST2SHIFTS @   Physical Exam:  Constitutional: alert, cooperative and no distress HEENT: NGT in place  Respiratory: breathing non-labored at rest  Gastrointestinal: soft, incisional tenderness, and non-distended, no rebound/guarding. Gastrojejunostomy tube in LUQ. Blake drain in RUQ with serosanguinous drainage.  Integumentary: midline laparotomy incision is CDI. No erythema, no drainage.    Labs:  CBC Latest Ref Rng & Units 03/10/2018 03/09/2018 01/28/2018  WBC 4.0 - 10.5 K/uL 9.5 8.9 6.4  Hemoglobin 12.0 - 15.0 g/dL 11.8(L) 13.1 15.4(H)  Hematocrit 36.0 - 46.0 % 35.8(L) 40.4 46.1(H)  Platelets 150 - 400 K/uL 182 204 289   CMP Latest Ref Rng & Units 03/10/2018 03/09/2018 01/28/2018  Glucose 70 - 99 mg/dL 146(H) 157(H) 102(H)  BUN 8 - 23 mg/dL 16 17 9   Creatinine 0.44 - 1.00  mg/dL 0.90 1.00 0.71  Sodium 135 - 145 mmol/L 137 138 136  Potassium 3.5 - 5.1 mmol/L 4.6 4.7 3.4(L)  Chloride 98 - 111 mmol/L 104 104 102  CO2 22 - 32 mmol/L 29 27 25   Calcium 8.9 - 10.3 mg/dL 8.5(L) 9.0 8.9  Total Protein 6.5 - 8.1 g/dL - - 7.0  Total Bilirubin 0.3 - 1.2 mg/dL - - 0.8  Alkaline Phos 38 - 126 U/L - - 78  AST 15 - 41 U/L - - 18  ALT 0 - 44 U/L - - 14     Imaging studies: No new pertinent imaging studies   Assessment/Plan:  68 y.o. female who is overall doing well receiving enteral feedings 1 Day Post-Op s/p open hital hernia repair and GE junction perforation repair with nissan fundoplication and placement of gastrojejnostomy tube   - Continue NPO, IVF, (decrease to 50 ml/hr), IV ABx (Zosyn)   - NGT for at leat 5 days to low intermittent wall suction to protect anastomosis  - Continue enteral feedings, appreciate dietitian involvement in care  - Pain control as needed, antiemetics prn  - Continue to monitor abdominal examination and ongoing bowel function  - Will engage PT today to aid with early ambulation.   - DVT Prophylaxis   All of the above findings and recommendations were discussed with the patient, and the medical team, and all of patient's questions were answered to her expressed satisfaction.  -- Edison Simon, PA-C [facility] 03/10/2018, 8:44 AM [phone] M-F: 7am - 4pm

## 2018-03-11 DIAGNOSIS — E43 Unspecified severe protein-calorie malnutrition: Secondary | ICD-10-CM

## 2018-03-11 LAB — URINALYSIS, ROUTINE W REFLEX MICROSCOPIC
Bilirubin Urine: NEGATIVE
GLUCOSE, UA: NEGATIVE mg/dL
KETONES UR: NEGATIVE mg/dL
Leukocytes,Ua: NEGATIVE
Nitrite: NEGATIVE
Protein, ur: NEGATIVE mg/dL
Specific Gravity, Urine: 1.035 — ABNORMAL HIGH (ref 1.005–1.030)
pH: 5 (ref 5.0–8.0)

## 2018-03-11 LAB — COMPREHENSIVE METABOLIC PANEL
ALT: 143 U/L — ABNORMAL HIGH (ref 0–44)
AST: 98 U/L — ABNORMAL HIGH (ref 15–41)
Albumin: 2.7 g/dL — ABNORMAL LOW (ref 3.5–5.0)
Alkaline Phosphatase: 52 U/L (ref 38–126)
Anion gap: 2 — ABNORMAL LOW (ref 5–15)
BUN: 16 mg/dL (ref 8–23)
CHLORIDE: 103 mmol/L (ref 98–111)
CO2: 31 mmol/L (ref 22–32)
CREATININE: 0.72 mg/dL (ref 0.44–1.00)
Calcium: 8.1 mg/dL — ABNORMAL LOW (ref 8.9–10.3)
GFR calc Af Amer: >60 mL/min (ref >60)
GFR calc non Af Amer: >60 mL/min (ref >60)
Glucose, Bld: 112 mg/dL — ABNORMAL HIGH (ref 70–99)
Potassium: 4 mmol/L (ref 3.5–5.1)
Sodium: 136 mmol/L (ref 135–145)
Total Bilirubin: 1.7 mg/dL — ABNORMAL HIGH (ref 0.3–1.2)
Total Protein: 5.1 g/dL — ABNORMAL LOW (ref 6.5–8.1)

## 2018-03-11 LAB — CBC
HCT: 33 % — ABNORMAL LOW (ref 36.0–46.0)
Hemoglobin: 10.7 g/dL — ABNORMAL LOW (ref 12.0–15.0)
MCH: 31.8 pg (ref 26.0–34.0)
MCHC: 32.4 g/dL (ref 30.0–36.0)
MCV: 97.9 fL (ref 80.0–100.0)
Platelets: 164 10*3/uL (ref 150–400)
RBC: 3.37 MIL/uL — AB (ref 3.87–5.11)
RDW: 12.8 % (ref 11.5–15.5)
WBC: 7.1 10*3/uL (ref 4.0–10.5)
nRBC: 0 % (ref 0.0–0.2)

## 2018-03-11 LAB — GLUCOSE, CAPILLARY
GLUCOSE-CAPILLARY: 96 mg/dL (ref 70–99)
Glucose-Capillary: 100 mg/dL — ABNORMAL HIGH (ref 70–99)
Glucose-Capillary: 117 mg/dL — ABNORMAL HIGH (ref 70–99)
Glucose-Capillary: 117 mg/dL — ABNORMAL HIGH (ref 70–99)
Glucose-Capillary: 70 mg/dL (ref 70–99)
Glucose-Capillary: 94 mg/dL (ref 70–99)
Glucose-Capillary: 96 mg/dL (ref 70–99)

## 2018-03-11 LAB — HEMOGLOBIN AND HEMATOCRIT, BLOOD
HCT: 34.3 % — ABNORMAL LOW (ref 36.0–46.0)
Hemoglobin: 11.2 g/dL — ABNORMAL LOW (ref 12.0–15.0)

## 2018-03-11 LAB — MAGNESIUM: Magnesium: 2.2 mg/dL (ref 1.7–2.4)

## 2018-03-11 LAB — HIV ANTIBODY (ROUTINE TESTING W REFLEX): HIV Screen 4th Generation wRfx: NONREACTIVE

## 2018-03-11 LAB — PHOSPHORUS: PHOSPHORUS: 3.1 mg/dL (ref 2.5–4.6)

## 2018-03-11 MED ORDER — LACTATED RINGERS IV BOLUS
1000.0000 mL | Freq: Once | INTRAVENOUS | Status: AC
  Administered 2018-03-11: 1000 mL via INTRAVENOUS

## 2018-03-11 MED ORDER — LACTATED RINGERS IV SOLN
INTRAVENOUS | Status: DC
  Administered 2018-03-11 (×2): via INTRAVENOUS

## 2018-03-11 NOTE — Progress Notes (Addendum)
Subjective:  CC:  Chelsey [name] is a 68 y.o. female  Hospital stay day 2, 2 Days Post-Op lap converted to open paraesophageal hernia repair  HPI: No new issues.  Still sore around incision site.  ROS:  General: Denies weight loss, weight gain, fatigue, fevers, chills, and night sweats. Heart: Denies chest pain, palpitations, racing heart, irregular heartbeat, leg pain or swelling, and decreased activity tolerance. Respiratory: Denies breathing difficulty, shortness of breath, wheezing, cough, and sputum. GI: Denies change in appetite, heartburn, nausea, vomiting, constipation, diarrhea, and blood in stool. GU: Denies difficulty urinating, pain with urinating, urgency, frequency, blood in urine.   Objective:      Temp:  [97.6 F (36.4 C)-99.3 F (37.4 C)] 97.6 F (36.4 C) (02/15 0459) Pulse Rate:  [62-89] 62 (02/15 0953) Resp:  [15-18] 18 (02/15 0459) BP: (77-99)/(51-66) 86/57 (02/15 0953) SpO2:  [97 %-99 %] 97 % (02/15 0638)     Height: 5\' 3"  (160 cm) Weight: 55.3 kg BMI (Calculated): 21.62   Intake/Output this shift:   Intake/Output Summary (Last 24 hours) at 03/11/2018 1020 Last data filed at 03/11/2018 3419 Gross per 24 hour  Intake 803.53 ml  Output 800 ml  Net 3.53 ml   JP with serosanguinous discharge GJ tube and NG with biliary output     Constitutional :  alert, cooperative, appears stated age and no distress  Respiratory:  clear to auscultation bilaterally  Cardiovascular:  regular rate and rhythm  Gastrointestinal: soft, tender around incision and drain sites..   Skin: Cool and moist. Staples intact, drains in place  Psychiatric: Normal affect, non-agitated, not confused       LABS:  CMP Latest Ref Rng & Units 03/11/2018 03/10/2018 03/09/2018  Glucose 70 - 99 mg/dL 112(H) 146(H) 157(H)  BUN 8 - 23 mg/dL 16 16 17   Creatinine 0.44 - 1.00 mg/dL 0.72 0.90 1.00  Sodium 135 - 145 mmol/L 136 137 138  Potassium 3.5 - 5.1 mmol/L 4.0 4.6 4.7  Chloride 98 - 111  mmol/L 103 104 104  CO2 22 - 32 mmol/L 31 29 27   Calcium 8.9 - 10.3 mg/dL 8.1(L) 8.5(L) 9.0  Total Protein 6.5 - 8.1 g/dL 5.1(L) - -  Total Bilirubin 0.3 - 1.2 mg/dL 1.7(H) - -  Alkaline Phos 38 - 126 U/L 52 - -  AST 15 - 41 U/L 98(H) - -  ALT 0 - 44 U/L 143(H) - -   CBC Latest Ref Rng & Units 03/11/2018 03/10/2018 03/09/2018  WBC 4.0 - 10.5 K/uL 7.1 9.5 8.9  Hemoglobin 12.0 - 15.0 g/dL 10.7(L) 11.8(L) 13.1  Hematocrit 36.0 - 46.0 % 33.0(L) 35.8(L) 40.4  Platelets 150 - 400 K/uL 164 182 204    RADS: n/a Assessment:   S/p lap converted to open paraesophageal hernia repair.    Continue NG until upper GI study next week.  gtube in place to pexy stomach,  Will continue to monitor JP output as well.    Foley noted to have very dark urine.  Will obtain U/A prior to removal.    Episode of hypotension noted in am.  Pt asymptomatic, states she never noticed any new symptoms.  S/p 1L bolus.  Will repeat BP and continue to monitor.  Hgb trending down, but no clinical evidence of bleeding from JP, gtube, NG tube.  Will repeat in pm and monitor for now.  Doubt blood loss is cause of hypotension at this point due to absence of active bleeding on physical exam

## 2018-03-12 LAB — CBC WITH DIFFERENTIAL/PLATELET
ABS IMMATURE GRANULOCYTES: 0.03 10*3/uL (ref 0.00–0.07)
Basophils Absolute: 0 10*3/uL (ref 0.0–0.1)
Basophils Relative: 0 %
EOS ABS: 0.2 10*3/uL (ref 0.0–0.5)
Eosinophils Relative: 3 %
HCT: 33.2 % — ABNORMAL LOW (ref 36.0–46.0)
Hemoglobin: 10.6 g/dL — ABNORMAL LOW (ref 12.0–15.0)
Immature Granulocytes: 1 %
Lymphocytes Relative: 12 %
Lymphs Abs: 0.8 10*3/uL (ref 0.7–4.0)
MCH: 31.2 pg (ref 26.0–34.0)
MCHC: 31.9 g/dL (ref 30.0–36.0)
MCV: 97.6 fL (ref 80.0–100.0)
Monocytes Absolute: 0.4 10*3/uL (ref 0.1–1.0)
Monocytes Relative: 6 %
NEUTROS PCT: 78 %
NRBC: 0 % (ref 0.0–0.2)
Neutro Abs: 5 10*3/uL (ref 1.7–7.7)
Platelets: 177 10*3/uL (ref 150–400)
RBC: 3.4 MIL/uL — ABNORMAL LOW (ref 3.87–5.11)
RDW: 12.6 % (ref 11.5–15.5)
WBC: 6.4 10*3/uL (ref 4.0–10.5)

## 2018-03-12 LAB — GLUCOSE, CAPILLARY
Glucose-Capillary: 101 mg/dL — ABNORMAL HIGH (ref 70–99)
Glucose-Capillary: 104 mg/dL — ABNORMAL HIGH (ref 70–99)
Glucose-Capillary: 105 mg/dL — ABNORMAL HIGH (ref 70–99)
Glucose-Capillary: 122 mg/dL — ABNORMAL HIGH (ref 70–99)
Glucose-Capillary: 93 mg/dL (ref 70–99)
Glucose-Capillary: 96 mg/dL (ref 70–99)

## 2018-03-12 LAB — PHOSPHORUS: Phosphorus: 3.3 mg/dL (ref 2.5–4.6)

## 2018-03-12 LAB — HEPATIC FUNCTION PANEL
ALT: 97 U/L — ABNORMAL HIGH (ref 0–44)
AST: 50 U/L — ABNORMAL HIGH (ref 15–41)
Albumin: 2.5 g/dL — ABNORMAL LOW (ref 3.5–5.0)
Alkaline Phosphatase: 59 U/L (ref 38–126)
Bilirubin, Direct: 0.4 mg/dL — ABNORMAL HIGH (ref 0.0–0.2)
Indirect Bilirubin: 0.9 mg/dL (ref 0.3–0.9)
Total Bilirubin: 1.3 mg/dL — ABNORMAL HIGH (ref 0.3–1.2)
Total Protein: 4.7 g/dL — ABNORMAL LOW (ref 6.5–8.1)

## 2018-03-12 LAB — MAGNESIUM: Magnesium: 2.1 mg/dL (ref 1.7–2.4)

## 2018-03-12 MED ORDER — CELECOXIB 200 MG PO CAPS
200.0000 mg | ORAL_CAPSULE | Freq: Every day | ORAL | Status: DC
  Administered 2018-03-13 – 2018-03-20 (×8): 200 mg via JEJUNOSTOMY
  Filled 2018-03-12 (×8): qty 1

## 2018-03-12 MED ORDER — DOCUSATE SODIUM 100 MG PO CAPS: 100.0000 mg | ORAL_CAPSULE | Freq: Two times a day (BID) | ORAL | Status: DC

## 2018-03-12 MED ORDER — CELECOXIB 200 MG PO CAPS
200.0000 mg | ORAL_CAPSULE | Freq: Every day | ORAL | Status: DC
  Filled 2018-03-12: qty 1

## 2018-03-12 MED ORDER — SIMETHICONE 80 MG PO CHEW
80.0000 mg | CHEWABLE_TABLET | Freq: Four times a day (QID) | ORAL | Status: DC | PRN
  Administered 2018-03-13 – 2018-03-20 (×4): 80 mg via ORAL
  Filled 2018-03-12 (×6): qty 1

## 2018-03-12 MED ORDER — DOCUSATE SODIUM 50 MG/5ML PO LIQD
100.0000 mg | Freq: Two times a day (BID) | ORAL | Status: DC
  Administered 2018-03-13 – 2018-03-16 (×2): 100 mg
  Filled 2018-03-12 (×19): qty 10

## 2018-03-12 MED ORDER — SODIUM CHLORIDE 0.9 % IV SOLN
INTRAVENOUS | Status: DC | PRN
  Administered 2018-03-12 – 2018-03-13 (×2): 250 mL via INTRAVENOUS
  Administered 2018-03-17 – 2018-03-18 (×2): 500 mL via INTRAVENOUS

## 2018-03-12 NOTE — Progress Notes (Signed)
Subjective:  CC:  Chelsey [name] is a 68 y.o. female  Hospital stay day 3, 3 Days Post-Op lap converted to open paraesophageal hernia repair  HPI: States episodic Increases in pain along with worsening bloating.  Passing some flatus, but no BM yet.   ROS:  General: Denies weight loss, weight gain, fatigue, fevers, chills, and night sweats. Heart: Denies chest pain, palpitations, racing heart, irregular heartbeat, leg pain or swelling, and decreased activity tolerance. Respiratory: Denies breathing difficulty, shortness of breath, wheezing, cough, and sputum. GI: Denies change in appetite, heartburn, nausea, vomiting, constipation, diarrhea, and blood in stool. GU: Denies difficulty urinating, pain with urinating, urgency, frequency, blood in urine.   Objective:      Temp:  [98.3 F (36.8 C)-98.7 F (37.1 C)] 98.4 F (36.9 C) (02/16 0418) Pulse Rate:  [67-75] 70 (02/16 0418) Resp:  [12-15] 14 (02/16 0418) BP: (105-112)/(68-77) 112/70 (02/16 0418) SpO2:  [98 %-99 %] 99 % (02/16 0418) Weight:  [63.7 kg] 63.7 kg (02/16 0418)     Height: 5\' 3"  (160 cm) Weight: 63.7 kg BMI (Calculated): 24.89   Intake/Output this shift:   Intake/Output Summary (Last 24 hours) at 03/12/2018 1008 Last data filed at 03/12/2018 5621 Gross per 24 hour  Intake 1319.21 ml  Output 1090 ml  Net 229.21 ml   JP with serosanguinous discharge 66ml/24hr GJ tube and NG with biliary output 347ml/24hr     Constitutional :  alert, cooperative, appears stated age and no distress  Respiratory:  clear to auscultation bilaterally  Cardiovascular:  regular rate and rhythm  Gastrointestinal: soft, tender around incision and drain sites. Slight increase in distention and tympany compared to yesterday  Skin: Cool and moist. Staples intact, drains in place  Psychiatric: Normal affect, non-agitated, not confused       LABS:  CMP Latest Ref Rng & Units 03/12/2018 03/11/2018 03/10/2018  Glucose 70 - 99 mg/dL -  112(H) 146(H)  BUN 8 - 23 mg/dL - 16 16  Creatinine 0.44 - 1.00 mg/dL - 0.72 0.90  Sodium 135 - 145 mmol/L - 136 137  Potassium 3.5 - 5.1 mmol/L - 4.0 4.6  Chloride 98 - 111 mmol/L - 103 104  CO2 22 - 32 mmol/L - 31 29  Calcium 8.9 - 10.3 mg/dL - 8.1(L) 8.5(L)  Total Protein 6.5 - 8.1 g/dL 4.7(L) 5.1(L) -  Total Bilirubin 0.3 - 1.2 mg/dL 1.3(H) 1.7(H) -  Alkaline Phos 38 - 126 U/L 59 52 -  AST 15 - 41 U/L 50(H) 98(H) -  ALT 0 - 44 U/L 97(H) 143(H) -   CBC Latest Ref Rng & Units 03/12/2018 03/11/2018 03/11/2018  WBC 4.0 - 10.5 K/uL 6.4 - 7.1  Hemoglobin 12.0 - 15.0 g/dL 10.6(L) 11.2(L) 10.7(L)  Hematocrit 36.0 - 46.0 % 33.2(L) 34.3(L) 33.0(L)  Platelets 150 - 400 K/uL 177 - 164    RADS: n/a Assessment:   S/p lap converted to open paraesophageal hernia repair.    Continue NG until upper GI study gtube in place to pexy stomach,  Will continue to monitor JP output as well.    Colace and simethicone for bloating.  Increased pain maybe from lack of BM and increased gas.  Will continue to monitor

## 2018-03-13 LAB — COMPREHENSIVE METABOLIC PANEL
ALT: 71 U/L — AB (ref 0–44)
AST: 28 U/L (ref 15–41)
Albumin: 2.5 g/dL — ABNORMAL LOW (ref 3.5–5.0)
Alkaline Phosphatase: 79 U/L (ref 38–126)
Anion gap: 5 (ref 5–15)
BUN: 10 mg/dL (ref 8–23)
CALCIUM: 8.3 mg/dL — AB (ref 8.9–10.3)
CO2: 28 mmol/L (ref 22–32)
Chloride: 105 mmol/L (ref 98–111)
Creatinine, Ser: 0.58 mg/dL (ref 0.44–1.00)
GFR calc Af Amer: >60 mL/min (ref >60)
GFR calc non Af Amer: >60 mL/min (ref >60)
Glucose, Bld: 120 mg/dL — ABNORMAL HIGH (ref 70–99)
Potassium: 3.9 mmol/L (ref 3.5–5.1)
Sodium: 138 mmol/L (ref 135–145)
Total Bilirubin: 1 mg/dL (ref 0.3–1.2)
Total Protein: 5.3 g/dL — ABNORMAL LOW (ref 6.5–8.1)

## 2018-03-13 LAB — GLUCOSE, CAPILLARY
Glucose-Capillary: 119 mg/dL — ABNORMAL HIGH (ref 70–99)
Glucose-Capillary: 110 mg/dL — ABNORMAL HIGH (ref 70–99)
Glucose-Capillary: 90 mg/dL (ref 70–99)
Glucose-Capillary: 88 mg/dL (ref 70–99)
Glucose-Capillary: 114 mg/dL — ABNORMAL HIGH (ref 70–99)

## 2018-03-13 LAB — CBC WITH DIFFERENTIAL/PLATELET
Abs Immature Granulocytes: 0.01 10*3/uL (ref 0.00–0.07)
Basophils Absolute: 0 10*3/uL (ref 0.0–0.1)
Basophils Relative: 0 %
Eosinophils Absolute: 0.2 10*3/uL (ref 0.0–0.5)
Eosinophils Relative: 4 %
HCT: 34 % — ABNORMAL LOW (ref 36.0–46.0)
Hemoglobin: 11.1 g/dL — ABNORMAL LOW (ref 12.0–15.0)
Immature Granulocytes: 0 %
Lymphocytes Relative: 15 %
Lymphs Abs: 0.8 10*3/uL (ref 0.7–4.0)
MCH: 31 pg (ref 26.0–34.0)
MCHC: 32.6 g/dL (ref 30.0–36.0)
MCV: 95 fL (ref 80.0–100.0)
MONO ABS: 0.4 10*3/uL (ref 0.1–1.0)
Monocytes Relative: 8 %
NEUTROS ABS: 4.2 10*3/uL (ref 1.7–7.7)
Neutrophils Relative %: 73 %
PLATELETS: 216 10*3/uL (ref 150–400)
RBC: 3.58 MIL/uL — AB (ref 3.87–5.11)
RDW: 12.7 % (ref 11.5–15.5)
WBC: 5.7 10*3/uL (ref 4.0–10.5)
nRBC: 0 % (ref 0.0–0.2)

## 2018-03-13 MED ORDER — PANCRELIPASE (LIP-PROT-AMYL) 12000-38000 UNITS PO CPEP
24000.0000 [IU] | ORAL_CAPSULE | Freq: Once | ORAL | Status: AC
  Administered 2018-03-13: 24000 [IU] via ORAL
  Filled 2018-03-13: qty 2

## 2018-03-13 MED ORDER — SODIUM BICARBONATE 650 MG PO TABS
650.0000 mg | ORAL_TABLET | Freq: Once | ORAL | Status: AC
  Administered 2018-03-13: 650 mg via ORAL
  Filled 2018-03-13: qty 1

## 2018-03-13 MED ORDER — DEXTROSE-NACL 5-0.9 % IV SOLN
INTRAVENOUS | Status: DC
  Administered 2018-03-13 – 2018-03-14 (×3): via INTRAVENOUS

## 2018-03-13 MED ORDER — OSMOLITE 1.2 CAL PO LIQD
1000.0000 mL | ORAL | Status: DC
  Administered 2018-03-13: 1000 mL

## 2018-03-13 NOTE — Progress Notes (Signed)
RN notified Dr. Dahlia Byes pt tube feeding were stop because his j tube is not flushing. RN try flushing the tube with coke as MD order but it didn't flush. MD stated he will stop by later. Will continue to assess and monitor pt.

## 2018-03-13 NOTE — Progress Notes (Signed)
Physical Therapy Treatment Patient Details Name: Chelsey [name] MRN: [id number] DOB: 05-28-1950 Today's Date: 03/13/2018    History of Present Illness 68 y.o. female who is overall doing well receiving enteral feedings 1 Day Post-Op s/p open hital hernia repair and GE junction perforation repair with nissan fundoplication and placement of gastrojejnostomy tube. PMH of breast cancer, GERD, HLD, panic disorder    PT Comments    Chelsey [name] was agreeable to therapy and made modest progress with ambulatory endurance.  RW introduced this session as pt reaching out for wall for support when ambulating.  Pt requires cues for safety with transfers.  Follow up recommendations remain appropriate.    Follow Up Recommendations  Home health PT;Supervision for mobility/OOB     Equipment Recommendations  None recommended by PT    Recommendations for Other Services       Precautions / Restrictions Precautions Precautions: Fall;Other (comment) Precaution Comments: NGT, gastrojejnostomy tube, JP drain, abdominal incision Restrictions Weight Bearing Restrictions: No    Mobility  Bed Mobility Overal bed mobility: Needs Assistance Bed Mobility: Supine to Sit;Sit to Supine     Supine to sit: Supervision Sit to supine: Supervision   General bed mobility comments: Pt performs log roll without cues needed from PT.  Pt is impulsive and begins bed mobility before lines secured and requires cues to wait.    Transfers Overall transfer level: Needs assistance Equipment used: Rolling walker (2 wheeled) Transfers: Sit to/from Stand Sit to Stand: Min guard         General transfer comment: Min guard for safety.  Pt with proper hand placement.  Cues to back up all the way before sitting.   Ambulation/Gait Ambulation/Gait assistance: Min guard Gait Distance (Feet): 180 Feet Assistive device: Rolling walker (2 wheeled);None Gait Pattern/deviations: Step-through pattern;Decreased step length -  right;Decreased step length - left;Trunk flexed Gait velocity: decreased   General Gait Details: Pt ambulated 10 ft but reaching out for wall and thus RW was introduced.  Pt remained steady using RW but did require cues for proper RW management as pt has tendency to push RW too far ahead.     Stairs             Wheelchair Mobility    Modified Rankin (Stroke Patients Only)       Balance Overall balance assessment: Needs assistance Sitting-balance support: No upper extremity supported;Feet supported Sitting balance-Leahy Scale: Good     Standing balance support: No upper extremity supported;During functional activity Standing balance-Leahy Scale: Fair Standing balance comment: Pt able to stand statically without UE support but reaching out for support while ambulating, used RW this session.                             Cognition Arousal/Alertness: Awake/alert Behavior During Therapy: WFL for tasks assessed/performed Overall Cognitive Status: Within Functional Limits for tasks assessed                                        Exercises      General Comments        Pertinent Vitals/Pain      Home Living                      Prior Function            PT Goals (  current goals can now be found in the care plan section) Acute Rehab PT Goals PT Goal Formulation: With patient Time For Goal Achievement: 03/24/18 Potential to Achieve Goals: Good Progress towards PT goals: Progressing toward goals    Frequency    Min 2X/week      PT Plan Current plan remains appropriate    Co-evaluation              AM-PAC PT "6 Clicks" Mobility   Outcome Measure  Help needed turning from your back to your side while in a flat bed without using bedrails?: A Little Help needed moving from lying on your back to sitting on the side of a flat bed without using bedrails?: A Little Help needed moving to and from a bed to a chair  (including a wheelchair)?: A Little Help needed standing up from a chair using your arms (e.g., wheelchair or bedside chair)?: A Little Help needed to walk in hospital room?: A Little Help needed climbing 3-5 steps with a railing? : A Little 6 Click Score: 18    End of Session Equipment Utilized During Treatment: Gait belt Activity Tolerance: Patient tolerated treatment well Patient left: in bed;with call bell/phone within reach;with bed alarm set Nurse Communication: Mobility status PT Visit Diagnosis: Other abnormalities of gait and mobility (R26.89);Muscle weakness (generalized) (M62.81);Difficulty in walking, not elsewhere classified (R26.2)     Time: 1550-1625(not charged for time spent with RN to flush J tube) PT Time Calculation (min) (ACUTE ONLY): 35 min  Charges:  $Gait Training: 8-22 mins                     Collie Siad PT, DPT 03/13/2018, 4:31 PM

## 2018-03-13 NOTE — Progress Notes (Signed)
RN talk to zack PA he will come to the floor to try to unclog j tube.

## 2018-03-13 NOTE — Progress Notes (Signed)
[place] [facility] Hospital Day(s): 4.   Post op day(s): 4 Days Post-Op.   Interval History: Patient seen and examined, no acute events or new complaints overnight. Patient reports improvement in her abdominal discomfort around her incision. This is now a 4/10 worse with movement. No complaints of fever, chills, nausea, or emesis. She endorses flatus and one bowel movement. NGT with around 200 ccs out.    Review of Systems:  Constitutional: denies fever, chills  Respiratory: denies any shortness of breath  Cardiovascular: denies chest pain or palpitations  Gastrointestinal: + abdominal pain, denied N/V, or diarrhea/and bowel function as per interval history Integumentary: denies any other rashes or skin discolorations except surgical incision.    Vital signs in last 24 hours: [min-max] current  Temp:  [97.9 F (36.6 C)-99 F (37.2 C)] 99 F (37.2 C) (02/17 0458) Pulse Rate:  [66-79] 70 (02/17 0458) Resp:  [16-18] 18 (02/17 0458) BP: (121-129)/(72-75) 121/75 (02/17 0458) SpO2:  [98 %-99 %] 98 % (02/17 0458) Weight:  [64.7 kg] 64.7 kg (02/17 0500)     Height: 5\' 3"  (160 cm) Weight: 64.7 kg BMI (Calculated): 25.28   Intake/Output this shift:  No intake/output data recorded.   Intake/Output last 2 shifts:  @IOLAST2SHIFTS @   Physical Exam:  Constitutional: alert, cooperative and no distress  Respiratory: breathing non-labored at rest  Gastrointestinal: soft, incisional tenderness, and non-distended, no rebound/guarding. Gastrojejunostomy tube in LUQ. Blake drain in RUQ with serous drainage.  Integumentary: midline laparotomy incision is CDI. No erythema, no drainage.   Labs:  CBC Latest Ref Rng & Units 03/13/2018 03/12/2018 03/11/2018  WBC 4.0 - 10.5 K/uL 5.7 6.4 -  Hemoglobin 12.0 - 15.0 g/dL 11.1(L) 10.6(L) 11.2(L)  Hematocrit 36.0 - 46.0 % 34.0(L) 33.2(L) 34.3(L)  Platelets 150 - 400 K/uL 216 177 -   CMP Latest Ref Rng & Units 03/13/2018  03/12/2018 03/11/2018  Glucose 70 - 99 mg/dL 120(H) - 112(H)  BUN 8 - 23 mg/dL 10 - 16  Creatinine 0.44 - 1.00 mg/dL 0.58 - 0.72  Sodium 135 - 145 mmol/L 138 - 136  Potassium 3.5 - 5.1 mmol/L 3.9 - 4.0  Chloride 98 - 111 mmol/L 105 - 103  CO2 22 - 32 mmol/L 28 - 31  Calcium 8.9 - 10.3 mg/dL 8.3(L) - 8.1(L)  Total Protein 6.5 - 8.1 g/dL 5.3(L) 4.7(L) 5.1(L)  Total Bilirubin 0.3 - 1.2 mg/dL 1.0 1.3(H) 1.7(H)  Alkaline Phos 38 - 126 U/L 79 59 52  AST 15 - 41 U/L 28 50(H) 98(H)  ALT 0 - 44 U/L 71(H) 97(H) 143(H)     Imaging studies: No new pertinent imaging studies   Assessment/Plan:  68 y.o. female who is overall doing well receiving enteral feedings 4 Days Post-Op s/p open hital hernia repair and GE junction perforation repair with nissan fundoplication and placement of gastrojejnostomy tube.   - Continue NPO, IVF, IV ABx (Zosyn)    - Continue NGT for today, will plan to study GE junction repair tomorrow   -  Continue enteral feedings, appreciate dietitian involvement in care             - Pain control as needed, antiemetics prn             - Continue to monitor abdominal examination and ongoing bowel function             - Continue PT, recommending HHPT.              -  DVT Prophylaxis  All of the above findings and recommendations were discussed with the patient, and the medical team, and all of patient's questions were answered to her expressed satisfaction.  -- Edison Simon, PA-C [facility] 03/13/2018, 9:00 AM [phone] M-F: 7am - 4pm \

## 2018-03-13 NOTE — Care Management (Signed)
Notified by Joelene Millin from [facility] that they are following patient and have protocols for home health RN at discharge

## 2018-03-13 NOTE — Progress Notes (Signed)
I tried flushing the feeding jejunostomy. I also placed the same moss tube wire that come with the kit that is approximately 5 cms shorter than the total length of the J tube. Multiple passes and still was not able to unclog it. We will try enzymes and bicarbonate. In the meantime we will hold tube feeds, it is ok to used the G portion for meds and clamp NGT We will obtain gastrografin study in am

## 2018-03-14 ENCOUNTER — Inpatient Hospital Stay: Payer: Medicare Other

## 2018-03-14 LAB — CBC WITH DIFFERENTIAL/PLATELET
Abs Immature Granulocytes: 0.02 10*3/uL (ref 0.00–0.07)
BASOS PCT: 1 %
Basophils Absolute: 0 10*3/uL (ref 0.0–0.1)
EOS ABS: 0.2 10*3/uL (ref 0.0–0.5)
Eosinophils Relative: 4 %
HEMATOCRIT: 33.7 % — AB (ref 36.0–46.0)
Hemoglobin: 11 g/dL — ABNORMAL LOW (ref 12.0–15.0)
Immature Granulocytes: 0 %
LYMPHS ABS: 0.8 10*3/uL (ref 0.7–4.0)
Lymphocytes Relative: 13 %
MCH: 31 pg (ref 26.0–34.0)
MCHC: 32.6 g/dL (ref 30.0–36.0)
MCV: 94.9 fL (ref 80.0–100.0)
Monocytes Absolute: 0.7 10*3/uL (ref 0.1–1.0)
Monocytes Relative: 11 %
Neutro Abs: 4.5 10*3/uL (ref 1.7–7.7)
Neutrophils Relative %: 71 %
PLATELETS: 224 10*3/uL (ref 150–400)
RBC: 3.55 MIL/uL — ABNORMAL LOW (ref 3.87–5.11)
RDW: 12.5 % (ref 11.5–15.5)
WBC: 6.3 10*3/uL (ref 4.0–10.5)
nRBC: 0 % (ref 0.0–0.2)

## 2018-03-14 LAB — COMPREHENSIVE METABOLIC PANEL
ALT: 74 U/L — ABNORMAL HIGH (ref 0–44)
AST: 56 U/L — ABNORMAL HIGH (ref 15–41)
Albumin: 2.7 g/dL — ABNORMAL LOW (ref 3.5–5.0)
Alkaline Phosphatase: 110 U/L (ref 38–126)
Anion gap: 3 — ABNORMAL LOW (ref 5–15)
BUN: 8 mg/dL (ref 8–23)
CO2: 29 mmol/L (ref 22–32)
Calcium: 8.2 mg/dL — ABNORMAL LOW (ref 8.9–10.3)
Chloride: 103 mmol/L (ref 98–111)
Creatinine, Ser: 0.57 mg/dL (ref 0.44–1.00)
GFR calc Af Amer: >60 mL/min (ref >60)
Glucose, Bld: 105 mg/dL — ABNORMAL HIGH (ref 70–99)
Potassium: 4.2 mmol/L (ref 3.5–5.1)
Sodium: 135 mmol/L (ref 135–145)
TOTAL PROTEIN: 5.2 g/dL — AB (ref 6.5–8.1)
Total Bilirubin: 1.4 mg/dL — ABNORMAL HIGH (ref 0.3–1.2)

## 2018-03-14 LAB — GLUCOSE, CAPILLARY
GLUCOSE-CAPILLARY: 105 mg/dL — AB (ref 70–99)
Glucose-Capillary: 114 mg/dL — ABNORMAL HIGH (ref 70–99)
Glucose-Capillary: 82 mg/dL (ref 70–99)
Glucose-Capillary: 91 mg/dL (ref 70–99)
Glucose-Capillary: 95 mg/dL (ref 70–99)
Glucose-Capillary: 99 mg/dL (ref 70–99)

## 2018-03-14 MED ORDER — SODIUM CHLORIDE (PF) 0.9 % IJ SOLN
10.0000 mL | INTRAMUSCULAR | Status: DC | PRN
  Administered 2018-03-14: 50 mL

## 2018-03-14 MED ORDER — IOPAMIDOL (ISOVUE-300) INJECTION 61%
100.0000 mL | Freq: Once | INTRAVENOUS | Status: AC | PRN
  Administered 2018-03-14: 100 mL

## 2018-03-14 MED ORDER — IOPAMIDOL (ISOVUE-300) INJECTION 61%
50.0000 mL | Freq: Once | INTRAVENOUS | Status: AC | PRN
  Administered 2018-03-14: 50 mL via ORAL

## 2018-03-14 MED ORDER — BOOST / RESOURCE BREEZE PO LIQD CUSTOM
1.0000 | Freq: Three times a day (TID) | ORAL | Status: DC
  Administered 2018-03-14 – 2018-03-15 (×2): 1 via ORAL

## 2018-03-14 NOTE — Progress Notes (Signed)
Initial Nutrition Assessment  DOCUMENTATION CODES:   Severe malnutrition in context of acute illness/injury  INTERVENTION:   Osmolite 1.2 @ goal rate of 23m/hr  Free water flushes 327mq4 hours to maintain tube patency   Regimen provides 1728kcal/day, 80g/day protein, 13612may free water  Tube feeds at goal rate will meet 100% of pt's micro and macronutrient requirements, including free water.  NUTRITION DIAGNOSIS:   Severe Malnutrition related to altered GI function(Hiatal hernia ) as evidenced by 9 percent weight loss in 2 months, mild fat depletion, moderate to severe muscle depletions.  GOAL:   Patient will meet greater than or equal to 90% of their needs -met with tube feeds   MONITOR:   Labs, Weight trends, TF tolerance, Skin, I & O's  REASON FOR ASSESSMENT:   Consult Enteral/tube feeding initiation and management  ASSESSMENT:   67 6o. female s/p open hital hernia repair and GE junction perforation repair with nissan fundoplication and placement of gastrojejnostomy tube 2/13   Pt tolerating tube feeds well up until tube became clogged. Pt does report some gas and abdominal distension yesterday. Pt s/p gastrografin swallow today. Per MD note, "no evidence of leak. Contrast flowing into the duodenum, repair is intact with stomach below diaphragm." Plan is for IR to attempt to unclog tube and then resume tube feeds. Refeed labs wnl. Per chart, pt with ~20lb weight gain since admit. Recommend discontinue IVF once tube feeds resumed.     Medications reviewed and include: colace, lovenox, protonix, paxil, NaCl w/ 5% dextrose at 100m87m, zosyn  Labs reviewed: K 4.2 wnl P 3.3 wnl, Mg 2.1 wnl- 2/16  Diet Order:   Diet Order            Diet NPO time specified  Diet effective midnight             EDUCATION NEEDS:   Education needs have been addressed  Skin:  Skin Assessment: Reviewed RN Assessment(incision abdomen )  Last BM:  2/17- TYPE 7  Height:   Ht  Readings from Last 1 Encounters:  03/09/18 5' 3"  (1.6 m)    Weight:   Wt Readings from Last 1 Encounters:  03/13/18 64.7 kg    Ideal Body Weight:  52.3 kg  BMI:  Body mass index is 25.28 kg/m.  Estimated Nutritional Needs:   Kcal:  1400-1600kcal/day   Protein:  72-83g/day   Fluid:  1.3L/day   CaseKoleen Distance RD, LDN Pager #- [phone]#- [phone] Hours Pager: [phone]

## 2018-03-14 NOTE — Progress Notes (Addendum)
Per MD okay for RN to place order for boost breeze. Also clamp NG tube and continue to hold tube feedings,

## 2018-03-14 NOTE — Progress Notes (Signed)
[place] Hospital Day(s): 5.   Post op day(s): 5 Days Post-Op.   Interval History: Patient seen and examined, no acute events or new complaints overnight. Patient reports she is very anxious about the gastrografin study this morning and needfded frequent reminds regarding the long-term goals and direction of her care. Continues to have some abdominal pain around the incisions. No fever, chills, nausea, or emesis. + BM per chart.   Review of Systems:  Constitutional: denies fever, chills  Gastrointestinal: + abdominal pain, no N/V, or diarrhea/and bowel function as per interval history Integumentary: denies any other rashes or skin discolorations except surgical incisions  Vital signs in last 24 hours: [min-max] current  Temp:  [98.1 F (36.7 C)-98.4 F (36.9 C)] 98.4 F (36.9 C) (02/18 0450) Pulse Rate:  [67-74] 68 (02/18 0450) Resp:  [17-18] 18 (02/18 0450) BP: (125-145)/(71-89) 125/71 (02/18 0450) SpO2:  [97 %-99 %] 98 % (02/18 0450)     Height: 5\' 3"  (160 cm) Weight: 64.7 kg BMI (Calculated): 25.28   Intake/Output this shift:  No intake/output data recorded.   Intake/Output last 2 shifts:  @IOLAST2SHIFTS @   Physical Exam:  Constitutional: alert, cooperative and no distress  Respiratory: breathing non-labored at rest  Gastrointestinal: soft,incisional tenderness, and non-distended, no rebound/guarding. Gastrojejunostomy tube in LUQ. Blake drain in RUQ with serous drainage. Integumentary:midline laparotomy incision is CDI. No erythema, no drainage.  Labs:  CBC Latest Ref Rng & Units 03/14/2018 03/13/2018 03/12/2018  WBC 4.0 - 10.5 K/uL 6.3 5.7 6.4  Hemoglobin 12.0 - 15.0 g/dL 11.0(L) 11.1(L) 10.6(L)  Hematocrit 36.0 - 46.0 % 33.7(L) 34.0(L) 33.2(L)  Platelets 150 - 400 K/uL 224 216 177   CMP Latest Ref Rng & Units 03/14/2018 03/13/2018 03/12/2018  Glucose 70 - 99 mg/dL 105(H) 120(H) -  BUN 8 - 23 mg/dL 8 10 -  Creatinine 0.44 -  1.00 mg/dL 0.57 0.58 -  Sodium 135 - 145 mmol/L 135 138 -  Potassium 3.5 - 5.1 mmol/L 4.2 3.9 -  Chloride 98 - 111 mmol/L 103 105 -  CO2 22 - 32 mmol/L 29 28 -  Calcium 8.9 - 10.3 mg/dL 8.2(L) 8.3(L) -  Total Protein 6.5 - 8.1 g/dL 5.2(L) 5.3(L) 4.7(L)  Total Bilirubin 0.3 - 1.2 mg/dL 1.4(H) 1.0 1.3(H)  Alkaline Phos 38 - 126 U/L 110 79 59  AST 15 - 41 U/L 56(H) 28 50(H)  ALT 0 - 44 U/L 74(H) 71(H) 97(H)     Imaging studies: No new pertinent imaging studies   Assessment/Plan:  68 y.o. female overall doing well 5 Days Post-Op s/p open hital hernia repair and GE junction perforation repair with nissan fundoplication and placement of gastrojejnostomy tube.   - ContinueNPO, IVF, IV ABx (Zosyn)     - Will obtain gastrografin study today to evaluate GE Junction repair   - Hold tube feedings for now given clogged GJ Tube and until after results of gastrografin study  - Pain control as needed, antiemetics prn - Continue to monitor abdominal examination and ongoing bowel function - Continue PT, recommending HHPT.  - DVT Prophylaxis   All of the above findings and recommendations were discussed with the patient, and the medical team, and all of patient's questions were answered to her expressed satisfaction.  -- Edison Simon, PA-C [facility] 03/14/2018, 7:44 AM [phone] M-F: 7am - 4pm

## 2018-03-14 NOTE — Progress Notes (Signed)
Per pt she wants to keep NG for tonight. Per MD it can be remove tomorrow morning,.

## 2018-03-15 LAB — MISC LABCORP TEST (SEND OUT): Labcorp test code: 88062

## 2018-03-15 LAB — COMPREHENSIVE METABOLIC PANEL
ALT: 69 U/L — AB (ref 0–44)
AST: 44 U/L — ABNORMAL HIGH (ref 15–41)
Albumin: 2.6 g/dL — ABNORMAL LOW (ref 3.5–5.0)
Alkaline Phosphatase: 116 U/L (ref 38–126)
Anion gap: 4 — ABNORMAL LOW (ref 5–15)
BUN: 5 mg/dL — ABNORMAL LOW (ref 8–23)
CALCIUM: 8.3 mg/dL — AB (ref 8.9–10.3)
CO2: 28 mmol/L (ref 22–32)
CREATININE: 0.58 mg/dL (ref 0.44–1.00)
Chloride: 106 mmol/L (ref 98–111)
GFR calc Af Amer: >60 mL/min (ref >60)
GFR calc non Af Amer: >60 mL/min (ref >60)
Glucose, Bld: 97 mg/dL (ref 70–99)
Potassium: 3.7 mmol/L (ref 3.5–5.1)
Sodium: 138 mmol/L (ref 135–145)
Total Bilirubin: 1.1 mg/dL (ref 0.3–1.2)
Total Protein: 5.7 g/dL — ABNORMAL LOW (ref 6.5–8.1)

## 2018-03-15 LAB — CBC WITH DIFFERENTIAL/PLATELET
Abs Immature Granulocytes: 0.03 10*3/uL (ref 0.00–0.07)
BASOS PCT: 1 %
Basophils Absolute: 0 10*3/uL (ref 0.0–0.1)
Eosinophils Absolute: 0.2 10*3/uL (ref 0.0–0.5)
Eosinophils Relative: 4 %
HEMATOCRIT: 33.5 % — AB (ref 36.0–46.0)
Hemoglobin: 10.9 g/dL — ABNORMAL LOW (ref 12.0–15.0)
Immature Granulocytes: 1 %
Lymphocytes Relative: 14 %
Lymphs Abs: 0.9 10*3/uL (ref 0.7–4.0)
MCH: 30.9 pg (ref 26.0–34.0)
MCHC: 32.5 g/dL (ref 30.0–36.0)
MCV: 94.9 fL (ref 80.0–100.0)
Monocytes Absolute: 0.7 10*3/uL (ref 0.1–1.0)
Monocytes Relative: 11 %
NEUTROS ABS: 4.3 10*3/uL (ref 1.7–7.7)
Neutrophils Relative %: 69 %
Platelets: 241 10*3/uL (ref 150–400)
RBC: 3.53 MIL/uL — ABNORMAL LOW (ref 3.87–5.11)
RDW: 12.7 % (ref 11.5–15.5)
WBC: 6.1 10*3/uL (ref 4.0–10.5)
nRBC: 0 % (ref 0.0–0.2)

## 2018-03-15 LAB — GLUCOSE, CAPILLARY
Glucose-Capillary: 107 mg/dL — ABNORMAL HIGH (ref 70–99)
Glucose-Capillary: 81 mg/dL (ref 70–99)
Glucose-Capillary: 81 mg/dL (ref 70–99)
Glucose-Capillary: 89 mg/dL (ref 70–99)
Glucose-Capillary: 91 mg/dL (ref 70–99)

## 2018-03-15 MED ORDER — FLEET ENEMA 7-19 GM/118ML RE ENEM: 1.0000 | ENEMA | Freq: Once | RECTAL | Status: DC | PRN

## 2018-03-15 MED ORDER — FLEET ENEMA 7-19 GM/118ML RE ENEM: 1.0000 | ENEMA | Freq: Once | RECTAL | Status: DC

## 2018-03-15 MED ORDER — ENSURE ENLIVE PO LIQD
237.0000 mL | Freq: Three times a day (TID) | ORAL | Status: DC
  Administered 2018-03-15 – 2018-03-16 (×3): 237 mL via ORAL

## 2018-03-15 NOTE — Progress Notes (Signed)
Results of Jp amylase are now available. The level is 1200. Which is elevated. However the pt wbc is normal she is not tachycardic , her swallow study did not show a leak . The level is elevated but not as high as the one expected in a saliva specimen. I have d/w Dr. Genevive Bi ( thoracic surgeon)  the case in detail. We will go slow. She has tolerated liquids and we will monitor her progress. There is no need for surgical indication at this time.

## 2018-03-15 NOTE — Progress Notes (Signed)
[place] Hospital Day(s): 6.   Post op day(s): 6 Days Post-Op.   Interval History: Patient seen and examined, no acute events or new complaints overnight. Patient is anxious regarding NGT removal. Reports of abdominal soreness near incisions. Denied any fevers, chills, nausea, or emesis. She tolerated clear liquids yesterday.   Review of Systems:  Constitutional: denies fever, chills  Gastrointestinal: + abdominal pain, denied N/V, or diarrhea/and bowel function as per interval history Integumentary: denies any other rashes or skin discolorations except surgical incision   Vital signs in last 24 hours: [min-max] current  Temp:  [97.7 F (36.5 C)-99.1 F (37.3 C)] 98.2 F (36.8 C) (02/19 0423) Pulse Rate:  [70-96] 96 (02/19 0423) Resp:  [18-20] 18 (02/19 0423) BP: (130-146)/(77-95) 146/95 (02/19 0423) SpO2:  [97 %-98 %] 98 % (02/19 0423)     Height: 5\' 3"  (160 cm) Weight: 64.7 kg BMI (Calculated): 25.28   Intake/Output this shift:  No intake/output data recorded.     Physical Exam:  Constitutional: alert, cooperative and no distress  Respiratory: breathing non-labored at rest Gastrointestinal: soft,incisional tenderness, and non-distended, no rebound/guarding. Gastrojejunostomy tube in LUQ. Blake drain in RUQ with serousdrainage. Integumentary:midline laparotomy incision is CDI. No erythema, no drainage.   Labs:  CBC Latest Ref Rng & Units 03/15/2018 03/14/2018 03/13/2018  WBC 4.0 - 10.5 K/uL 6.1 6.3 5.7  Hemoglobin 12.0 - 15.0 g/dL 10.9(L) 11.0(L) 11.1(L)  Hematocrit 36.0 - 46.0 % 33.5(L) 33.7(L) 34.0(L)  Platelets 150 - 400 K/uL 241 224 216   CMP Latest Ref Rng & Units 03/15/2018 03/14/2018 03/13/2018  Glucose 70 - 99 mg/dL 97 105(H) 120(H)  BUN 8 - 23 mg/dL 5(L) 8 10  Creatinine 0.44 - 1.00 mg/dL 0.58 0.57 0.58  Sodium 135 - 145 mmol/L 138 135 138  Potassium 3.5 - 5.1 mmol/L 3.7 4.2 3.9  Chloride 98 - 111 mmol/L 106 103  105  CO2 22 - 32 mmol/L 28 29 28   Calcium 8.9 - 10.3 mg/dL 8.3(L) 8.2(L) 8.3(L)  Total Protein 6.5 - 8.1 g/dL 5.7(L) 5.2(L) 5.3(L)  Total Bilirubin 0.3 - 1.2 mg/dL 1.1 1.4(H) 1.0  Alkaline Phos 38 - 126 U/L 116 110 79  AST 15 - 41 U/L 44(H) 56(H) 28  ALT 0 - 44 U/L 69(H) 74(H) 71(H)     Imaging studies: No new pertinent imaging studies   Assessment/Plan:  68 y.o. female overall doing well 6 Days Post-Op s/p open hital hernia repair and GE junction perforation repair with nissan fundoplication and placement of gastrojejnostomy tube   - Advance to full liquids, wean IVF   - Continue IV ABx (Zosyn), continue GJ-Tube to pexy stomach   - Discontinue NGT   - Pain control as needed, antiemetics prn - Continue to monitor abdominal examination and ongoing bowel function -Continue PT, recommending HHPT.  - DVT Prophylaxis    All of the above findings and recommendations were discussed with the patient, and the medical team, and all of patient's questions were answered to her expressed satisfaction.  -- Edison Simon, PA-C [facility] 03/15/2018, 9:41 AM [phone] M-F: 7am - 4pm

## 2018-03-15 NOTE — Progress Notes (Signed)
Per MD okay for RN to remove NG tube.

## 2018-03-16 LAB — GLUCOSE, CAPILLARY
GLUCOSE-CAPILLARY: 88 mg/dL (ref 70–99)
Glucose-Capillary: 122 mg/dL — ABNORMAL HIGH (ref 70–99)
Glucose-Capillary: 85 mg/dL (ref 70–99)
Glucose-Capillary: 84 mg/dL (ref 70–99)
Glucose-Capillary: 92 mg/dL (ref 70–99)

## 2018-03-16 LAB — CBC WITH DIFFERENTIAL/PLATELET
ABS IMMATURE GRANULOCYTES: 0.03 10*3/uL (ref 0.00–0.07)
Basophils Absolute: 0 10*3/uL (ref 0.0–0.1)
Basophils Relative: 0 %
Eosinophils Absolute: 0.3 10*3/uL (ref 0.0–0.5)
Eosinophils Relative: 4 %
HCT: 30.8 % — ABNORMAL LOW (ref 36.0–46.0)
Hemoglobin: 10 g/dL — ABNORMAL LOW (ref 12.0–15.0)
Immature Granulocytes: 0 %
Lymphocytes Relative: 16 %
Lymphs Abs: 1.1 10*3/uL (ref 0.7–4.0)
MCH: 31.3 pg (ref 26.0–34.0)
MCHC: 32.5 g/dL (ref 30.0–36.0)
MCV: 96.3 fL (ref 80.0–100.0)
Monocytes Absolute: 0.6 10*3/uL (ref 0.1–1.0)
Monocytes Relative: 9 %
NEUTROS ABS: 5.1 10*3/uL (ref 1.7–7.7)
Neutrophils Relative %: 71 %
Platelets: 245 10*3/uL (ref 150–400)
RBC: 3.2 MIL/uL — AB (ref 3.87–5.11)
RDW: 12.7 % (ref 11.5–15.5)
WBC: 7.2 10*3/uL (ref 4.0–10.5)
nRBC: 0 % (ref 0.0–0.2)

## 2018-03-16 LAB — COMPREHENSIVE METABOLIC PANEL
ALT: 60 U/L — ABNORMAL HIGH (ref 0–44)
AST: 35 U/L (ref 15–41)
Albumin: 2.5 g/dL — ABNORMAL LOW (ref 3.5–5.0)
Alkaline Phosphatase: 134 U/L — ABNORMAL HIGH (ref 38–126)
Anion gap: 7 (ref 5–15)
BUN: <5 mg/dL — ABNORMAL LOW (ref 8–23)
CO2: 26 mmol/L (ref 22–32)
Calcium: 8.2 mg/dL — ABNORMAL LOW (ref 8.9–10.3)
Chloride: 104 mmol/L (ref 98–111)
Creatinine, Ser: 0.6 mg/dL (ref 0.44–1.00)
GFR calc Af Amer: >60 mL/min (ref >60)
GFR calc non Af Amer: >60 mL/min (ref >60)
Glucose, Bld: 94 mg/dL (ref 70–99)
Potassium: 3.8 mmol/L (ref 3.5–5.1)
SODIUM: 137 mmol/L (ref 135–145)
Total Bilirubin: 1 mg/dL (ref 0.3–1.2)
Total Protein: 5.2 g/dL — ABNORMAL LOW (ref 6.5–8.1)

## 2018-03-16 LAB — AMYLASE, BODY FLUID (OTHER): Amylase, Body Fluid: 85 U/L

## 2018-03-16 MED ORDER — POLYETHYLENE GLYCOL 3350 17 G PO PACK
17.0000 g | PACK | Freq: Every day | ORAL | Status: DC
  Administered 2018-03-16: 17 g via ORAL
  Filled 2018-03-16 (×2): qty 1

## 2018-03-16 MED ORDER — BOOST / RESOURCE BREEZE PO LIQD CUSTOM
1.0000 | Freq: Three times a day (TID) | ORAL | Status: DC
  Administered 2018-03-16: 1 via ORAL

## 2018-03-16 NOTE — Plan of Care (Signed)
  Problem: Education: Goal: Knowledge of General Education information will improve Description Including pain rating scale, medication(s)/side effects and non-pharmacologic comfort measures Outcome: Progressing   Problem: Clinical Measurements: Goal: Ability to maintain clinical measurements within normal limits will improve Outcome: Progressing Goal: Will remain free from infection Outcome: Progressing Goal: Diagnostic test results will improve Outcome: Progressing Goal: Respiratory complications will improve Outcome: Progressing Goal: Cardiovascular complication will be avoided Outcome: Progressing   Problem: Activity: Goal: Risk for activity intolerance will decrease Outcome: Progressing   Problem: Coping: Goal: Level of anxiety will decrease Outcome: Progressing  Problem: Skin Integrity: Goal: Risk for impaired skin integrity will decrease Outcome: Progressing  Patients incisions are clean, dry and intact.

## 2018-03-16 NOTE — Progress Notes (Signed)
Patient scores a 10 for her fall risk. Patient refuses bed alarm. We discussed the potential consequences of the alarm being off. Patient agrees. Will keep alarm off.

## 2018-03-16 NOTE — Care Management Note (Signed)
Case Management Note  Patient Details  Name: Chelsey [name] MRN: [id number] Date of Birth: 1950-03-23   Patient states that she lives at home with her son.   PCP [facility].  Pharmacy [facility].  Patient denies issues obtaining medications or with transportation.  PT has assessed patient and recommends home health PT.  Patient agreeable to services. CMS Medicare.gov Compare Post Acute Care list reviewed with patient.  Patient states she does not have a preference of agency. [facility] home [facility] had been given a pre referral for nursing protocol.  Patient is in agreement.  Joelene Millin with [facility] aware of admission.  PT ha recommending walker at discharge.  At this time patient is declining RW.  Patient states that she does not feel as though on is indicated, and "im pretty sure I have one"  Subjective/Objective:                    Action/Plan:   Expected Discharge Date:                  Expected Discharge Plan:  [place]  In-House Referral:     Discharge planning Services  CM Consult  Post Acute Care Choice:  Home Health Choice offered to:  Patient  DME Arranged:    DME Agency:     HH Arranged:  RN, PT HH Agency:  [facility]  Status of Service:  In process, will continue to follow  If discussed at Long Length of Stay Meetings, dates discussed:    Additional Comments:  Beverly Sessions, RN 03/16/2018, 2:53 PM

## 2018-03-16 NOTE — Progress Notes (Signed)
Physical Therapy Treatment Patient Details Name: Chelsey [name] MRN: [id number] DOB: 04-Sep-1950 Today's Date: 03/16/2018    History of Present Illness 68 y.o. female who is overall doing well receiving enteral feedings 1 Day Post-Op s/p open hital hernia repair and GE junction perforation repair with nissan fundoplication and placement of gastrojejnostomy tube. PMH of breast cancer, GERD, HLD, panic disorder    PT Comments    Pt in bed, ready for walk.  Transitioned out of bed with without assist.  She declined use of walker with gait.  She was able to walk 1/2 way around unit without AD but did voice feeling slightly unsteady with gait.  She did not have any LOB's but opted to hold my hand for balance.  While walking back to room.  She did report general fatigue but was able to climb easily back into bed.  Discussed AD and encouraged pt to use RW with nursing.  She voiced understanding and seemed open to a walker at discharge for gait at home and in community until strength increases.   Follow Up Recommendations  Home health PT;Supervision for mobility/OOB     Equipment Recommendations  Rolling walker with 5" wheels    Recommendations for Other Services       Precautions / Restrictions Precautions Precautions: Fall;Other (comment) Precaution Comments:  gastrojejnostomy tube, JP drain, abdominal incision Restrictions Weight Bearing Restrictions: No    Mobility  Bed Mobility Overal bed mobility: Needs Assistance Bed Mobility: Supine to Sit;Sit to Supine     Supine to sit: Modified independent (Device/Increase time) Sit to supine: Modified independent (Device/Increase time)      Transfers Overall transfer level: Needs assistance Equipment used: None;1 person hand held assist Transfers: Sit to/from Stand Sit to Stand: Modified independent (Device/Increase time)            Ambulation/Gait Ambulation/Gait assistance: Modified independent (Device/Increase time) Gait  Distance (Feet): 180 Feet Assistive device: None;1 person hand held assist Gait Pattern/deviations: Step-through pattern;Decreased step length - right;Decreased step length - left;Narrow base of support Gait velocity: decreased   General Gait Details: ambualted 1/2 way around nursing unit before taking writers hand.  reports feeling somewhat unsteady but no LOB's.   Stairs             Wheelchair Mobility    Modified Rankin (Stroke Patients Only)       Balance Overall balance assessment: Needs assistance Sitting-balance support: No upper extremity supported;Feet supported Sitting balance-Leahy Scale: Good     Standing balance support: No upper extremity supported;During functional activity Standing balance-Leahy Scale: Fair                              Cognition Arousal/Alertness: Awake/alert Behavior During Therapy: WFL for tasks assessed/performed Overall Cognitive Status: Within Functional Limits for tasks assessed                                        Exercises      General Comments        Pertinent Vitals/Pain Pain Assessment: No/denies pain Pain Intervention(s): Monitored during session;Repositioned    Home Living                      Prior Function            PT Goals (current goals can now  be found in the care plan section) Progress towards PT goals: Progressing toward goals    Frequency    Min 2X/week      PT Plan Current plan remains appropriate    Co-evaluation              AM-PAC PT "6 Clicks" Mobility   Outcome Measure  Help needed turning from your back to your side while in a flat bed without using bedrails?: None Help needed moving from lying on your back to sitting on the side of a flat bed without using bedrails?: None Help needed moving to and from a bed to a chair (including a wheelchair)?: None Help needed standing up from a chair using your arms (e.g., wheelchair or bedside  chair)?: None Help needed to walk in hospital room?: A Little Help needed climbing 3-5 steps with a railing? : A Little 6 Click Score: 22    End of Session Equipment Utilized During Treatment: Gait belt Activity Tolerance: Patient tolerated treatment well Patient left: in bed;with call bell/phone within reach;with family/visitor present Nurse Communication: Mobility status       Time: 3154-0086 PT Time Calculation (min) (ACUTE ONLY): 12 min  Charges:  $Gait Training: 8-22 mins                     Chesley Noon, PTA 03/16/18, 9:33 AM

## 2018-03-16 NOTE — Progress Notes (Signed)
[place] Hospital Day(s): 7.   Post op day(s): 7 Days Post-Op.   Interval History: Patient seen and examined, no acute events or new complaints overnight. Patient reports continued incisional soreness but denied any fever, chills, nausea, or emesis. Continues to endorse flatus and small bowel movements but feels somewhat constipated. Continues to tolerate clears. Plans to work with PT today.   Review of Systems:  Constitutional: denies fever, chills  Gastrointestinal: + Incisional soreness, N/V, or diarrhea/and bowel function as per interval history Integumentary: denies any other rashes or skin discolorations except surgical incisions   Vital signs in last 24 hours: [min-max] current  Temp:  [97.9 F (36.6 C)-98.6 F (37 C)] 98.4 F (36.9 C) (02/20 0749) Pulse Rate:  [63-69] 63 (02/20 0749) Resp:  [18] 18 (02/20 0749) BP: (90-113)/(63-76) 90/63 (02/20 0749) SpO2:  [97 %-100 %] 100 % (02/20 0749) Weight:  [60.6 kg] 60.6 kg (02/20 0500)     Height: 5\' 3"  (160 cm) Weight: 60.6 kg BMI (Calculated): 23.65   Intake/Output this shift:  No intake/output data recorded.    Physical Exam:  Constitutional: alert, cooperative and no distress  Respiratory: breathing non-labored at rest Gastrointestinal: soft,incisional tenderness, and non-distended, no rebound/guarding. Gastrojejunostomy tube in LUQ. Blake drain in RUQ with serousdrainage. Integumentary:midline laparotomy incision is CDI. No erythema, no drainage.  Labs:  CBC Latest Ref Rng & Units 03/16/2018 03/15/2018 03/14/2018  WBC 4.0 - 10.5 K/uL 7.2 6.1 6.3  Hemoglobin 12.0 - 15.0 g/dL 10.0(L) 10.9(L) 11.0(L)  Hematocrit 36.0 - 46.0 % 30.8(L) 33.5(L) 33.7(L)  Platelets 150 - 400 K/uL 245 241 224   CMP Latest Ref Rng & Units 03/16/2018 03/15/2018 03/14/2018  Glucose 70 - 99 mg/dL 94 97 105(H)  BUN 8 - 23 mg/dL <5(L) 5(L) 8  Creatinine 0.44 - 1.00 mg/dL 0.60 0.58 0.57  Sodium 135 - 145  mmol/L 137 138 135  Potassium 3.5 - 5.1 mmol/L 3.8 3.7 4.2  Chloride 98 - 111 mmol/L 104 106 103  CO2 22 - 32 mmol/L 26 28 29   Calcium 8.9 - 10.3 mg/dL 8.2(L) 8.3(L) 8.2(L)  Total Protein 6.5 - 8.1 g/dL 5.2(L) 5.7(L) 5.2(L)  Total Bilirubin 0.3 - 1.2 mg/dL 1.0 1.1 1.4(H)  Alkaline Phos 38 - 126 U/L 134(H) 116 110  AST 15 - 41 U/L 35 44(H) 56(H)  ALT 0 - 44 U/L 60(H) 69(H) 74(H)     Imaging studies: No new pertinent imaging studies   Assessment/Plan:  68 y.o. female overall doing well6 Days Post-Ops/p open hital hernia repair and GE junction perforation repair with nissan fundoplication and placement of gastrojejnostomy tube   - Continue clear liquids + nutritional supplementation (appreciate dietitian input)   - Continue IV ABx (Zosyn), continue GJ-Tube to pexy stomach, monitor abdominal examination, on-going bowel function, JP drain output    - Follow up on Amylase  - Working with PT, recommending HHPT  - Medical management of comorbidities   - DVT Prophylaxis   All of the above findings and recommendations were discussed with the patient, and the medical team, and all of patient's questions were answered to her expressed satisfaction.  -- Edison Simon, PA-C [facility] 03/16/2018, 9:17 AM [phone] M-F: 7am - 4pm

## 2018-03-17 LAB — C DIFFICILE QUICK SCREEN W PCR REFLEX
C DIFFICILE (CDIFF) INTERP: NOT DETECTED
C Diff antigen: NEGATIVE
C Diff toxin: NEGATIVE

## 2018-03-17 LAB — COMPREHENSIVE METABOLIC PANEL
ALBUMIN: 2.5 g/dL — AB (ref 3.5–5.0)
ALT: 50 U/L — ABNORMAL HIGH (ref 0–44)
AST: 25 U/L (ref 15–41)
Alkaline Phosphatase: 150 U/L — ABNORMAL HIGH (ref 38–126)
Anion gap: 6 (ref 5–15)
BUN: <5 mg/dL — ABNORMAL LOW (ref 8–23)
CALCIUM: 8.2 mg/dL — AB (ref 8.9–10.3)
CO2: 25 mmol/L (ref 22–32)
Chloride: 109 mmol/L (ref 98–111)
Creatinine, Ser: 0.51 mg/dL (ref 0.44–1.00)
GFR calc Af Amer: >60 mL/min (ref >60)
GFR calc non Af Amer: >60 mL/min (ref >60)
GLUCOSE: 92 mg/dL (ref 70–99)
Potassium: 3.5 mmol/L (ref 3.5–5.1)
Sodium: 140 mmol/L (ref 135–145)
Total Bilirubin: 0.9 mg/dL (ref 0.3–1.2)
Total Protein: 5.3 g/dL — ABNORMAL LOW (ref 6.5–8.1)

## 2018-03-17 MED ORDER — POLYETHYLENE GLYCOL 3350 17 G PO PACK: 17.0000 g | PACK | Freq: Every day | ORAL | Status: DC | PRN

## 2018-03-17 MED ORDER — ADULT MULTIVITAMIN W/MINERALS CH
1.0000 | ORAL_TABLET | Freq: Every day | ORAL | Status: DC
  Administered 2018-03-18 – 2018-03-20 (×3): 1 via ORAL
  Filled 2018-03-17 (×3): qty 1

## 2018-03-17 MED ORDER — NEPRO/CARBSTEADY PO LIQD
237.0000 mL | Freq: Two times a day (BID) | ORAL | Status: DC
  Administered 2018-03-17 – 2018-03-20 (×6): 237 mL via ORAL

## 2018-03-17 MED ORDER — ENSURE ENLIVE PO LIQD
237.0000 mL | Freq: Three times a day (TID) | ORAL | Status: DC
  Administered 2018-03-17: 237 mL via ORAL

## 2018-03-17 NOTE — Progress Notes (Addendum)
[place] Hospital Day(s): 8.   Post op day(s): 8 Days Post-Op.   Interval History: Patient seen and examined, no acute events or new complaints overnight. Patient reports she continues to have improved abdominal incisional soreness. No complaints of fever, chills, nausea, or emesis. She continues to endorse passing flatus and loose bowel movements. Plans to mobilize with PT.   Review of Systems:  Constitutional: denies fever, chills  Gastrointestinal: + Incisional soreness, N/V, or diarrhea/and bowel function as per interval history Integumentary: denies any other rashes or skin discolorations except surgical incisions   Vital signs in last 24 hours: [min-max] current  Temp:  [97.7 F (36.5 C)-98.4 F (36.9 C)] 97.7 F (36.5 C) (02/21 0629) Pulse Rate:  [63-80] 78 (02/21 0629) Resp:  [16-18] 16 (02/21 0629) BP: (90-128)/(63-83) 125/83 (02/21 0629) SpO2:  [99 %-100 %] 99 % (02/21 0629) Weight:  [60.5 kg] 60.5 kg (02/21 0629)     Height: 5\' 3"  (160 cm) Weight: 60.5 kg BMI (Calculated): 23.64   Intake/Output this shift:  No intake/output data recorded.   Intake/Output last 2 shifts:  @IOLAST2SHIFTS @   Physical Exam:  Constitutional: alert, cooperative and no distress  Respiratory: breathing non-labored at rest Gastrointestinal: soft,incisional tenderness, and non-distended, no rebound/guarding. Gastrojejunostomy tube in LUQ. Blake drain in RUQ with serousdrainage. Integumentary:midline laparotomy incision is CDI. No erythema, no drainage.   Labs:  CBC Latest Ref Rng & Units 03/16/2018 03/15/2018 03/14/2018  WBC 4.0 - 10.5 K/uL 7.2 6.1 6.3  Hemoglobin 12.0 - 15.0 g/dL 10.0(L) 10.9(L) 11.0(L)  Hematocrit 36.0 - 46.0 % 30.8(L) 33.5(L) 33.7(L)  Platelets 150 - 400 K/uL 245 241 224   CMP Latest Ref Rng & Units 03/17/2018 03/16/2018 03/15/2018  Glucose 70 - 99 mg/dL 92 94 97  BUN 8 - 23 mg/dL <5(L) <5(L) 5(L)  Creatinine 0.44 - 1.00  mg/dL 0.51 0.60 0.58  Sodium 135 - 145 mmol/L 140 137 138  Potassium 3.5 - 5.1 mmol/L 3.5 3.8 3.7  Chloride 98 - 111 mmol/L 109 104 106  CO2 22 - 32 mmol/L 25 26 28   Calcium 8.9 - 10.3 mg/dL 8.2(L) 8.2(L) 8.3(L)  Total Protein 6.5 - 8.1 g/dL 5.3(L) 5.2(L) 5.7(L)  Total Bilirubin 0.3 - 1.2 mg/dL 0.9 1.0 1.1  Alkaline Phos 38 - 126 U/L 150(H) 134(H) 116  AST 15 - 41 U/L 25 35 44(H)  ALT 0 - 44 U/L 50(H) 60(H) 69(H)    Imaging studies: No new pertinent imaging studies   Assessment/Plan: 68 y.o. female overall well doing well 8 Days Post-Op s/p open hital hernia repair and GE junction perforation repair with nissan fundoplication and placement of gastrojejnostomy tube.   - Advance to full liquids + nutritional supplementation, Ensure okay (appreciate dietitian input)              - Continue IV ABx (Zosyn), continue GJ-Tube to pexy stomach, monitor abdominal examination, on-going bowel function, JP drain output              - Follow up amylase is 85  - Given watery diarrhea, will hold Miralax, test for c diff given long term IV abx             - Working with PT, recommending HHPT             - Medical management of comorbidities              - DVT Prophylaxis    - If doing  well over the weekend and feeling better, okay to discharge hopefully over the weekend  All of the above findings and recommendations were discussed with the patient, and the medical team, and all of patient's questions were answered to her expressed satisfaction.  -- Edison Simon, PA-C [facility] 03/17/2018, 7:29 AM [phone] M-F: 7am - 4pm

## 2018-03-18 NOTE — Progress Notes (Signed)
[place] Hospital Day(s): 9.   Post op day(s): 9 Days Post-Op.   Interval History: Patient seen and examined, no acute events or new complaints overnight. Patient reports feels tightness around the abdominal area that has make it very difficult for her to get out of bed and ambulate. denies nausea or vomiting.  Reports continued having diarrhea  Vital signs in last 24 hours: [min-max] current  Temp:  [97.4 F (36.3 C)-99.2 F (37.3 C)] 97.4 F (36.3 C) (02/22 0518) Pulse Rate:  [69-71] 70 (02/22 0518) Resp:  [16-20] 20 (02/22 0518) BP: (106-130)/(73-88) 130/88 (02/22 0518) SpO2:  [98 %-99 %] 99 % (02/22 0518) Weight:  [60.2 kg] 60.2 kg (02/22 0500)     Height: 5\' 3"  (160 cm) Weight: 60.2 kg BMI (Calculated): 23.52   Physical Exam:  Constitutional: alert, cooperative and no distress  Respiratory: breathing non-labored at rest  Cardiovascular: regular rate and sinus rhythm  Gastrointestinal: soft, mild-tender, and non-distended. Wound are dry and clean.   Labs:  CBC Latest Ref Rng & Units 03/16/2018 03/15/2018 03/14/2018  WBC 4.0 - 10.5 K/uL 7.2 6.1 6.3  Hemoglobin 12.0 - 15.0 g/dL 10.0(L) 10.9(L) 11.0(L)  Hematocrit 36.0 - 46.0 % 30.8(L) 33.5(L) 33.7(L)  Platelets 150 - 400 K/uL 245 241 224   CMP Latest Ref Rng & Units 03/17/2018 03/16/2018 03/15/2018  Glucose 70 - 99 mg/dL 92 94 97  BUN 8 - 23 mg/dL <5(L) <5(L) 5(L)  Creatinine 0.44 - 1.00 mg/dL 0.51 0.60 0.58  Sodium 135 - 145 mmol/L 140 137 138  Potassium 3.5 - 5.1 mmol/L 3.5 3.8 3.7  Chloride 98 - 111 mmol/L 109 104 106  CO2 22 - 32 mmol/L 25 26 28   Calcium 8.9 - 10.3 mg/dL 8.2(L) 8.2(L) 8.3(L)  Total Protein 6.5 - 8.1 g/dL 5.3(L) 5.2(L) 5.7(L)  Total Bilirubin 0.3 - 1.2 mg/dL 0.9 1.0 1.1  Alkaline Phos 38 - 126 U/L 150(H) 134(H) 116  AST 15 - 41 U/L 25 35 44(H)  ALT 0 - 44 U/L 50(H) 60(H) 69(H)    Imaging studies: No new pertinent imaging studies   Assessment/Plan:  68 y.o. female with cattle  hernia 9 Days Post-Op s/p Nissen fundoplication.  Patient has been recovering slowly.  Still with significant pain unable to be ambulate properly.  Also with diarrhea that needs to be hydrated because she is only on full liquids and will not be able to compensate the diarrhea at this moment.  C. difficile was negative so we will continue with conservative management with hydration and assessment for toleration and improvement of the diarrhea.  Continue with pain management.  Arnold Long, MD

## 2018-03-18 NOTE — Progress Notes (Signed)
Patient c/o 8/10 pain that worsens after eating. Belching a lot but not passing any gas from rectum.

## 2018-03-19 NOTE — Progress Notes (Signed)
[place] Hospital Day(s): 10.   Post op day(s): 10 Days Post-Op.   Interval History: Patient seen and examined.  Yesterday nurse notified that patient was having pain with the full liquid diet.  Patient was placed on bowel rest during the night to see if the pain went away.  Today patient reports that she wants to restart the full liquid diet because she refers that the pain was due to a gas and she is passing gas now..  Vital signs in last 24 hours: [min-max] current  Temp:  [97.8 F (36.6 C)-98 F (36.7 C)] 98 F (36.7 C) (02/23 0407) Pulse Rate:  [64-78] 64 (02/23 0407) Resp:  [16] 16 (02/23 0407) BP: (106-119)/(71-82) 119/71 (02/23 0407) SpO2:  [100 %] 100 % (02/23 0407) Weight:  [60.3 kg] 60.3 kg (02/23 0407)     Height: 5\' 3"  (160 cm) Weight: 60.3 kg BMI (Calculated): 23.57   Physical Exam:  Constitutional: alert, cooperative and no distress  Respiratory: breathing non-labored at rest  Cardiovascular: regular rate and sinus rhythm  Gastrointestinal: soft, mild tender in upper abdomen, and non-distended.  Labs:  CBC Latest Ref Rng & Units 03/16/2018 03/15/2018 03/14/2018  WBC 4.0 - 10.5 K/uL 7.2 6.1 6.3  Hemoglobin 12.0 - 15.0 g/dL 10.0(L) 10.9(L) 11.0(L)  Hematocrit 36.0 - 46.0 % 30.8(L) 33.5(L) 33.7(L)  Platelets 150 - 400 K/uL 245 241 224   CMP Latest Ref Rng & Units 03/17/2018 03/16/2018 03/15/2018  Glucose 70 - 99 mg/dL 92 94 97  BUN 8 - 23 mg/dL <5(L) <5(L) 5(L)  Creatinine 0.44 - 1.00 mg/dL 0.51 0.60 0.58  Sodium 135 - 145 mmol/L 140 137 138  Potassium 3.5 - 5.1 mmol/L 3.5 3.8 3.7  Chloride 98 - 111 mmol/L 109 104 106  CO2 22 - 32 mmol/L 25 26 28   Calcium 8.9 - 10.3 mg/dL 8.2(L) 8.2(L) 8.3(L)  Total Protein 6.5 - 8.1 g/dL 5.3(L) 5.2(L) 5.7(L)  Total Bilirubin 0.3 - 1.2 mg/dL 0.9 1.0 1.1  Alkaline Phos 38 - 126 U/L 150(H) 134(H) 116  AST 15 - 41 U/L 25 35 44(H)  ALT 0 - 44 U/L 50(H) 60(H) 69(H)    Imaging studies: No new pertinent imaging  studies   Assessment/Plan:  68 y.o. female with cattle hernia 10 Days Post-Op s/p Nissen fundoplication.  Patient with improved pain after having pain with full liquid diet.  She refers that she want to restart a full liquid diet since she had gas and the pain has resolved.  She reports that the pain was not due to the diet itself but due to the cast.  Will restart full liquid diet and assess for toleration again.  If pain recurs, will decrease the diet to clear liquids or will give bowel rest again.  Patient encouraged to ambulate.  We will continue with pain management.  DVT prophylaxis.  Arnold Long, MD

## 2018-03-20 MED ORDER — HYDROCODONE-ACETAMINOPHEN 7.5-325 MG/15ML PO SOLN: 15.0000 mL | Freq: Four times a day (QID) | ORAL | 0 refills | Status: DC | PRN

## 2018-03-20 NOTE — Progress Notes (Addendum)
Nutrition Follow Up Note   DOCUMENTATION CODES:   Severe malnutrition in context of acute illness/injury  INTERVENTION:   Nepro Shake po BID, each supplement provides 425 kcal and 19 grams protein  Magic cup TID with meals, each supplement provides 290 kcal and 9 grams of protein  MVI daily   NUTRITION DIAGNOSIS:   Severe Malnutrition related to altered GI function(Hiatal hernia ) as evidenced by 9 percent weight loss in 2 months, mild fat depletion, moderate to severe muscle depletions.  GOAL:   Patient will meet greater than or equal to 90% of their needs -progressing    MONITOR:   PO intake, Supplement acceptance, Labs, Weight trends, Skin, I & O's  ASSESSMENT:   68 y.o. female s/p open hital hernia repair and GE junction perforation repair with nissan fundoplication and placement of gastrojejnostomy tube 2/13   G-J tube clogged since 2/17. Pt has been back and forth between full and clear liquid diets. Pt is drinking supplements; pt enjoyed the [employer] but did not care for the Ensure as she reports she did not like the flavors. RD discussed with pt the differences between the Boost and Ensure. Ensure supplements have twice the protein as Boost Breeze. Pt agreed to try Nepro supplement as this comes in butter pecan and mixed berry flavors and provides similar amount of protein as Ensure. Pt continues to have large amounts of gas; pt belching throughout entire conversation with RD. Pt is ordered for simethicone prn but has not received any since 2/19. Per chart, pt with ~11lb weight loss since admit; pt appears to be at her UBW now. Encouraged pt to aim for 2-3 supplements daily. MVI ordered for micronutrient needs as pt is unable to eat balanced diet on full liquids. Pt currently eating ~80% of meals. Pt reports continued diarrhea; C-diff negative.   Medications reviewed and include: colace, lovenox, MVI, protonix, paxil  Labs reviewed: BUN <5(L)-2/21  Diet Order:   Diet  Order            Diet full liquid Room service appropriate? Yes; Fluid consistency: Thin  Diet effective now             EDUCATION NEEDS:   Education needs have been addressed  Skin:  Skin Assessment: Reviewed RN Assessment(incision abdomen )  Last BM:  2/23- type 6  Height:   Ht Readings from Last 1 Encounters:  03/09/18 5\' 3"  (1.6 m)    Weight:   Wt Readings from Last 1 Encounters:  03/20/18 58.7 kg    Ideal Body Weight:  52.3 kg  BMI:  Body mass index is 22.9 kg/m.  Estimated Nutritional Needs:   Kcal:  1400-1600kcal/day   Protein:  72-83g/day   Fluid:  1.3L/day   Koleen Distance MS, RD, LDN Pager #- [phone] Office#- [phone] After Hours Pager: [phone]

## 2018-03-20 NOTE — Discharge Summary (Signed)
[facility] SURGICAL DISCHARGE SUMMARY  Patient ID: Chelsey [name] MRN: [id number] DOB/AGE: 67/13/1952 67 y.o.  Admit date: 03/09/2018 Discharge date: 03/20/2018  Discharge Diagnoses Hiatal Hernia S/P GE Junction Repair  Consultants None  Procedures 03/09/2018:  Attempted robotic assisted hiatal hernia repair Open Hiatal hernia repair with repair of GE junction and Nissen Fundoplication Placement of gastrojejunostomy tube  HPI: Chelsey [name] is a 68 y.o. female who [facility] to [facility] on 03/09/2018 for robot assisted hiatal hernia repair with Dr Dahlia Byes.   Hospital Course: Informed consent was obtained and documented, and patient underwent robot-assisted hiatal hernia repair which was converted to open hiatal hernia repair secondary to GE Junction perforation requiring repair, nissen fundoplication, and placement of gastrojejunostomy tube (Dr Dahlia Byes, 03/09/2018).  Post-operatively, the patient remained NPO and received tube feedings through the [place] tube for 5 days post-operatively. Unfortunately her GJ tube became clogged on POD4, but the following day her swallow study did not revealed any leak from the repair. Her NGT was removed on POD5 and she was started on clears. Her amylase was trended from her JP drain which was initially 1200 however on re-check was only 85. She continued on clears/fulls throughout her hospital stay. On POD11, she was mobilizing well, tolerating a diet, continued to have bowel function, and remained stable. At this time, she was discharged home with lifting restrictions, driving restrictions, nissen diet recommendations, and close follow up with Dr Dahlia Byes. Her JP and staples were removed prior to discharge. All of her questions were addressed and answered.     Discharge Condition: Good   Physical Examination:  Constitutional: alert, cooperative and no distress  Respiratory: breathing non-labored at rest  Cardiovascular: regular rate and sinus  rhythm  Gastrointestinal: soft, mild incisional soreness, and non-distended. JP in RUQ with minimal serous drainage Integumentary: Midline incision is CDI, no erythema or drainage, staples in place.    Allergies as of 03/20/2018   No Known Allergies     Medication List    TAKE these medications   busPIRone 10 MG tablet Commonly known as:  BUSPAR Take 1 tablet (10 mg total) by mouth 2 (two) times daily.   CALCIUM + D PO Take 1 tablet daily by mouth.   GAS-X PO Take 1-2 tablets by mouth daily as needed (gas).   HYDROcodone-acetaminophen 7.5-325 mg/15 ml solution Commonly known as:  HYCET Take 15 mLs by mouth 4 (four) times daily as needed for up to 30 doses for moderate pain.   IBGARD PO Take 2 tablets by mouth daily before supper.   ibuprofen 200 MG tablet Commonly known as:  ADVIL,MOTRIN Take 200-400 mg by mouth daily as needed for headache or moderate pain.   multivitamin tablet Take 1 tablet daily by mouth.   omeprazole 20 MG capsule Commonly known as:  PRILOSEC Take 1 capsule (20 mg total) by mouth 2 (two) times daily before a meal.   PARoxetine 20 MG tablet Commonly known as:  PAXIL Take 1 tablet (20 mg total) by mouth daily before supper.   simvastatin 20 MG tablet Commonly known as:  ZOCOR TAKE ONE TABLET BY MOUTH AT BEDTIME   traZODone 100 MG tablet Commonly known as:  DESYREL Take 1 tablet (100 mg total) by mouth at bedtime.   Vitamin D 50 MCG (2000 UT) tablet Take 2,000 Units by mouth daily.        Follow-up Information    Pabon, [place] F, MD. Schedule an appointment as soon as possible for a  visit on 03/27/2018.   Specialty:  General Surgery Why:  s/p hital hernia and GE junction repair Contact information: [address] [place] [address] [phone]            -- Chelsey [name] , PA-C [facility]  03/20/2018, 11:56 AM [phone] M-F: 7am - 4pm

## 2018-03-20 NOTE — Progress Notes (Signed)
Patient cleared for discharge. IV removed. JP drain removed. Staples removed. Informed that G-tube will remain another 6 weeks. Discharge instructions given. Pharmacy verified. Patient belongings gathered. Will discharge to home via [place] with Home Health.

## 2018-03-20 NOTE — Care Management Note (Signed)
Case Management Note  Patient Details  Name: Chelsey [name] MRN: [id number] Date of Birth: 10/16/1950   Patient to discharge home today.  Joelene Millin with [facility] notified of discharge. Per Joelene Millin patient is scheduled for start of care tomorrow.   Subjective/Objective:                    Action/Plan:   Expected Discharge Date:  03/20/18               Expected Discharge Plan:  [place]  In-House Referral:     Discharge planning Services  CM Consult  Post Acute Care Choice:  Home Health Choice offered to:  Patient  DME Arranged:    DME Agency:     HH Arranged:  RN, PT HH Agency:  [facility]  Status of Service:  Completed, signed off  If discussed at [place] of Stay Meetings, dates discussed:    Additional Comments:  Beverly Sessions, RN 03/20/2018, 2:41 PM

## 2018-03-21 ENCOUNTER — Telehealth: Payer: Self-pay | Admitting: Surgery

## 2018-03-21 NOTE — Telephone Encounter (Signed)
Spoke with Dorian Pod at [facility]- Patient has G-tube and wants to know should sh be flushing daily.  Also patient is taking Paxill and trazodone, is this ok.   Patient is seeing Dr.Pabon tomorrow and I will let Dorian Pod know after I speak to [facility].

## 2018-03-21 NOTE — Telephone Encounter (Signed)
Patient states she is having difficulty eating and keeping it down. She has advanced to soft food diet. She feels like there is a lump in her throat at times and cant swallow it. She is sipping on fluids ( water) and it comes back up.   Patient is calling around to find a way to the office tomorrow. When patient calls back please add to Dr.pabons schedule tomorrow.

## 2018-03-21 NOTE — Telephone Encounter (Signed)
Patient called the office during lunch hours and left a message. I have called patient back. She was confirming her appointment that is scheduled with Dr Dahlia Byes on 03/29/18 at 10:30am.   Patient would also like to speak with a CMA/RN. She has some concerns about being able to keep food down. She stated that she was eating successfully when in the hospital, however since being home she is having a hard time eating.   She also stated that she thinks her Home Health Nurse also called our office in reference to this as well. No notes in chart of this call or from answering service.

## 2018-03-22 ENCOUNTER — Ambulatory Visit
Admission: RE | Admit: 2018-03-22 | Discharge: 2018-03-22 | Disposition: A | Discharge status: Home / Self Care | Payer: Federal, State, Local not specified - PPO | Attending: Surgery | Admitting: Surgery

## 2018-03-22 ENCOUNTER — Ambulatory Visit
Admission: RE | Admit: 2018-03-22 | Discharge: 2018-03-22 | Disposition: A | Discharge status: Home / Self Care | Payer: Federal, State, Local not specified - PPO | Source: Ambulatory Visit | Attending: Surgery | Admitting: Surgery

## 2018-03-22 ENCOUNTER — Other Ambulatory Visit
Admission: RE | Admit: 2018-03-22 | Discharge: 2018-03-22 | Disposition: A | Discharge status: Home / Self Care | Payer: Federal, State, Local not specified - PPO | Source: Home / Self Care | Attending: Surgery | Admitting: Surgery

## 2018-03-22 ENCOUNTER — Encounter: Payer: Self-pay | Admitting: Surgery

## 2018-03-22 ENCOUNTER — Ambulatory Visit (INDEPENDENT_AMBULATORY_CARE_PROVIDER_SITE_OTHER): Payer: Federal, State, Local not specified - PPO | Admitting: Surgery

## 2018-03-22 ENCOUNTER — Encounter: Payer: Self-pay | Admitting: *Deleted

## 2018-03-22 ENCOUNTER — Telehealth: Payer: Self-pay | Admitting: *Deleted

## 2018-03-22 ENCOUNTER — Other Ambulatory Visit: Payer: Self-pay

## 2018-03-22 VITALS — BP 88/58 | HR 102 | Temp 97.5°F | Resp 12 | Ht 63.0 in | Wt 123.0 lb

## 2018-03-22 DIAGNOSIS — R1011 Right upper quadrant pain: Secondary | ICD-10-CM

## 2018-03-22 DIAGNOSIS — R1012 Left upper quadrant pain: Secondary | ICD-10-CM

## 2018-03-22 LAB — CBC WITH DIFFERENTIAL/PLATELET
Abs Immature Granulocytes: 0.04 10*3/uL (ref 0.00–0.07)
Basophils Absolute: 0.1 10*3/uL (ref 0.0–0.1)
Basophils Relative: 1 %
Eosinophils Absolute: 0.3 10*3/uL (ref 0.0–0.5)
Eosinophils Relative: 3 %
HCT: 39.3 % (ref 36.0–46.0)
Hemoglobin: 12.8 g/dL (ref 12.0–15.0)
Immature Granulocytes: 0 %
Lymphocytes Relative: 7 %
Lymphs Abs: 0.8 10*3/uL (ref 0.7–4.0)
MCH: 31.7 pg (ref 26.0–34.0)
MCHC: 32.6 g/dL (ref 30.0–36.0)
MCV: 97.3 fL (ref 80.0–100.0)
MONO ABS: 0.9 10*3/uL (ref 0.1–1.0)
MONOS PCT: 7 %
Neutro Abs: 9.7 10*3/uL — ABNORMAL HIGH (ref 1.7–7.7)
Neutrophils Relative %: 82 %
Platelets: 577 10*3/uL — ABNORMAL HIGH (ref 150–400)
RBC: 4.04 MIL/uL (ref 3.87–5.11)
RDW: 13.1 % (ref 11.5–15.5)
WBC: 11.9 10*3/uL — ABNORMAL HIGH (ref 4.0–10.5)
nRBC: 0 % (ref 0.0–0.2)

## 2018-03-22 LAB — BASIC METABOLIC PANEL
Anion gap: 11 (ref 5–15)
BUN: 14 mg/dL (ref 8–23)
CALCIUM: 9.1 mg/dL (ref 8.9–10.3)
CO2: 25 mmol/L (ref 22–32)
Chloride: 101 mmol/L (ref 98–111)
Creatinine, Ser: 0.62 mg/dL (ref 0.44–1.00)
GFR calc Af Amer: >60 mL/min (ref >60)
GFR calc non Af Amer: >60 mL/min (ref >60)
Glucose, Bld: 130 mg/dL — ABNORMAL HIGH (ref 70–99)
Potassium: 4 mmol/L (ref 3.5–5.1)
Sodium: 137 mmol/L (ref 135–145)

## 2018-03-22 NOTE — Telephone Encounter (Signed)
Notified Dorian Pod with [facility] no need to flush GTube per Dr Dahlia Byes.

## 2018-03-22 NOTE — Telephone Encounter (Signed)
D/W the pt in detail about lab results. In shows dehydration w hemoconcentration and increase in BUN. CXR no free air or air in mediastinum. She is taking Pedialyte and is tolerating it well. D/W her about oral hydration. I did offer to see her Tomorrow in the office if she continues to have issues. Also d/w her about potentially obtaining CT A/P if her sxs persist.  Discussed with her about alarming signs, and that she may have to go to the ER if she experiences fevers, chills, emesis or severe pain. She showed understanding

## 2018-03-22 NOTE — Patient Instructions (Addendum)
The patient is aware to call back for any questions or new concerns. Recommend Pedialyte for fluid supplement and continue Ensure   Return on Friday  No need to flush GTube Full liquid diet reviewed

## 2018-03-22 NOTE — Progress Notes (Signed)
Outpatient Surgical Follow Up  03/22/2018  Chelsey [name] is an 68 y.o. female.   Chief Complaint  Patient presents with  . Routine Post Op    Post op: Hiatal Hernia Repair 03/09/2018 Dr.Charene Mccallister-patient cant keep fluids down    HPI: s/p open paraesophageal hernia repair w repair of GE junction due to Bougie perforation intra-op.  She was discharge 2 days ago. At that time She was tolerating Diet, WBC and VS were stable. She was ambulating. Now comes with abdominal pain. She has taken liquids, scrambled eggs and had some difficulty with grits yesterday. She had an unsure this am which she tolerated well. She walked into the office w/o difficulty. Unfortunately her social situation is difficult. She is accompanied by her sister. BY The pt in currently at home and lives with her 68 yo Chelsey [name]. I have not seen the Chelsey [name] in the hospital and apparently he is a limited resource. No fevers or chills. Reports HAving a BM 2 days ago.  She does have some baseline cognitive deficits and this makes her clinical evaluation difficult. She forget things easily. We have talked about her diet and about the need to keep G tube for at least 6-8 weeks and she still is repetitive about thee questions.   Past Medical History:  Diagnosis Date  . Breast cancer ([place]) 1997   right breast, radiation  . Bronchitis    recent/ 06/09/15 had chest xray/ [facility]/resolved  . Cancer [facility]) 1997   right lumpectomy,L/Ad/R   . GERD (gastroesophageal reflux disease)   . History of hiatal hernia   . Hypercholesteremia   . Hyperlipidemia   . Low BP    TYPICALLY RUNS 80'S/60'S  . Panic attack   . Personal history of malignant neoplasm of breast     Past Surgical History:  Procedure Laterality Date  . BICEPT TENODESIS Left 11/23/2016   Procedure: BICEPS TENODESIS;  Surgeon: Leim Fabry, MD;  Location: [facility];  Service: Orthopedics;  Laterality: Left;  . BREAST EXCISIONAL BIOPSY Right 1997   pos  . BREAST SURGERY  Right 1997   lumpectomy  . CHOLECYSTECTOMY N/A 08/06/2016   Procedure: LAPAROSCOPIC CHOLECYSTECTOMY WITH INTRAOPERATIVE CHOLANGIOGRAM;  Surgeon: Christene Lye, MD;  Location: [facility];  Service: General;  Laterality: N/A;  . COLONOSCOPY  2008  . COLONOSCOPY WITH PROPOFOL N/A 07/21/2015   Procedure: COLONOSCOPY WITH PROPOFOL;  Surgeon: Lucilla Lame, MD;  Location: [place];  Service: Endoscopy;  Laterality: N/A;  . COLONOSCOPY WITH PROPOFOL N/A 01/05/2018   Procedure: COLONOSCOPY WITH PROPOFOL;  Surgeon: Jonathon Bellows, MD;  Location: [facility] ENDOSCOPY;  Service: Gastroenterology;  Laterality: N/A;  . ESOPHAGOGASTRODUODENOSCOPY (EGD) WITH PROPOFOL N/A 06/16/2016   Procedure: ESOPHAGOGASTRODUODENOSCOPY (EGD) WITH PROPOFOL;  Surgeon: Christene Lye, MD;  Location: [facility] ENDOSCOPY;  Service: Endoscopy;  Laterality: N/A;  . ESOPHAGOGASTRODUODENOSCOPY (EGD) WITH PROPOFOL N/A 01/05/2018   Procedure: ESOPHAGOGASTRODUODENOSCOPY (EGD) WITH PROPOFOL;  Surgeon: Jonathon Bellows, MD;  Location: [facility] ENDOSCOPY;  Service: Gastroenterology;  Laterality: N/A;  . HARDWARE REMOVAL Left 12/27/2016   Procedure: HARDWARE REMOVAL LEFT  PROXIMAL HUMEROUS;  Surgeon: Leim Fabry, MD;  Location: [facility];  Service: Orthopedics;  Laterality: Left;  . NISSEN FUNDOPLICATION N/A 03/18/9796   Procedure: NISSEN FUNDOPLICATION;  Surgeon: Jules Husbands, MD;  Location: [facility];  Service: General;  Laterality: N/A;  . ORIF HUMERUS FRACTURE Left 11/23/2016   Procedure: OPEN REDUCTION INTERNAL FIXATION (ORIF) PROXIMAL HUMERUS FRACTURE;  Surgeon: Leim Fabry, MD;  Location: [facility];  Service: Orthopedics;  Laterality: Left;  . REPAIR OF ESOPHAGUS  03/09/2018   Procedure: REPAIR OF ESOPHAGUS;  Surgeon: Jules Husbands, MD;  Location: [facility];  Service: General;;  . REVERSE SHOULDER ARTHROPLASTY Left 12/27/2016   Procedure: REVERSE SHOULDER ARTHROPLASTY;  Surgeon: Leim Fabry, MD;  Location: [facility];  Service:  Orthopedics;  Laterality: Left;  . ROBOTIC ASSISTED LAPAROSCOPIC REPAIR OF PARAESOPHAGEAL HERNIA N/A 03/09/2018   Procedure: ROBOTIC HIATAL HERNIA CONVERTED TO OPEN;  Surgeon: Jules Husbands, MD;  Location: [facility];  Service: General;  Laterality: N/A;    Family History  Problem Relation Age of Onset  . Anxiety disorder Brother   . Obesity Brother   . COPD Brother   . Kidney disease Mother   . Heart attack Father   . Hypertension Father   . Alcohol abuse Father   . Depression Brother   . Anxiety disorder Brother   . Breast cancer Maternal Grandmother     Social History:  reports that she has been smoking cigarettes. She started smoking about 8 years ago. She has a 2.50 pack-year smoking history. She has never used smokeless tobacco. She reports that she does not drink alcohol or use drugs.  Allergies: No Known Allergies  Medications reviewed.    ROS Full ROS performed and is otherwise negative other than what is stated in HPI   BP (!) 88/58   Pulse (!) 102   Temp (!) 97.5 F (36.4 C) (Temporal)   Resp 12   Ht 5\' 3"  (1.6 m)   Wt 123 lb (55.8 kg)   LMP  (LMP Unknown)   SpO2 93%   BMI 21.79 kg/m   Physical Exam Pt walked into the exam room. Sister is present. She is comfortable but c/o pain that is located more toward the LUQ were the G tube is. BP 74-91/54-60 HR 102 Sats 93% RA. Chest: CTA, s1,s2 Abd: soft, midline healing well, G tube site tender. I deflated the balloon by 5 cc ( reservoir is 20cc) she did feel significant relief. Appropriate incisional tenderness. No peritonitis or infection Ext: well perfused and no edema    Assessment/Plan: Persistent abd pain. She does take trazodone and she states that her BP usually runs on the low side. I believe she is dehydrated. I will start w/u w CBC, bmp and CXR, She has had a negative w/u for an esophageal leak so far but that is certainly a part of my differential. I will see her in 2 days. I encourage her to drink  Pedialyte and keep up w nutritional supplements. If w/u is abnormal may need to be re-admitted for further w/u. I also encourage her to hold the trazodone for the next few days until her condition improves. Both pt and sister are in agreement with the plan    Caroleen Hamman, MD [facility] General Surgeon

## 2018-03-22 NOTE — Progress Notes (Signed)
Patient sent to [facility] to have the following labs drawn today: CBC and BMP.   Also, patient to have a chest x-ray 2 view.   Patient will follow up with Dr. Dahlia Byes in the office as scheduled for Friday, 03-24-18 at 8:15 am.

## 2018-03-24 ENCOUNTER — Ambulatory Visit
Admission: RE | Admit: 2018-03-24 | Discharge: 2018-03-24 | Disposition: A | Discharge status: Home / Self Care | Payer: Federal, State, Local not specified - PPO | Source: Ambulatory Visit | Attending: Surgery | Admitting: Surgery

## 2018-03-24 ENCOUNTER — Encounter: Payer: Self-pay | Admitting: Surgery

## 2018-03-24 ENCOUNTER — Other Ambulatory Visit: Payer: Self-pay

## 2018-03-24 ENCOUNTER — Other Ambulatory Visit
Admission: RE | Admit: 2018-03-24 | Discharge: 2018-03-24 | Disposition: A | Discharge status: Home / Self Care | Payer: Federal, State, Local not specified - PPO | Source: Ambulatory Visit | Attending: Surgery | Admitting: Surgery

## 2018-03-24 ENCOUNTER — Ambulatory Visit (INDEPENDENT_AMBULATORY_CARE_PROVIDER_SITE_OTHER): Payer: Federal, State, Local not specified - PPO | Admitting: Surgery

## 2018-03-24 VITALS — BP 99/62 | HR 101 | Temp 97.5°F | Resp 14 | Ht 65.0 in | Wt 120.4 lb

## 2018-03-24 DIAGNOSIS — R1011 Right upper quadrant pain: Secondary | ICD-10-CM | POA: Insufficient documentation

## 2018-03-24 DIAGNOSIS — K449 Diaphragmatic hernia without obstruction or gangrene: Secondary | ICD-10-CM | POA: Diagnosis present

## 2018-03-24 LAB — CBC WITH DIFFERENTIAL/PLATELET
Abs Immature Granulocytes: 0.04 10*3/uL (ref 0.00–0.07)
Basophils Absolute: 0.1 10*3/uL (ref 0.0–0.1)
Basophils Relative: 1 %
Eosinophils Absolute: 0.4 10*3/uL (ref 0.0–0.5)
Eosinophils Relative: 3 %
HCT: 34.8 % — ABNORMAL LOW (ref 36.0–46.0)
Hemoglobin: 11.3 g/dL — ABNORMAL LOW (ref 12.0–15.0)
Immature Granulocytes: 0 %
Lymphocytes Relative: 9 %
Lymphs Abs: 0.9 10*3/uL (ref 0.7–4.0)
MCH: 31.3 pg (ref 26.0–34.0)
MCHC: 32.5 g/dL (ref 30.0–36.0)
MCV: 96.4 fL (ref 80.0–100.0)
Monocytes Absolute: 0.9 10*3/uL (ref 0.1–1.0)
Monocytes Relative: 9 %
NEUTROS ABS: 8 10*3/uL — AB (ref 1.7–7.7)
Neutrophils Relative %: 78 %
Platelets: 613 10*3/uL — ABNORMAL HIGH (ref 150–400)
RBC: 3.61 MIL/uL — ABNORMAL LOW (ref 3.87–5.11)
RDW: 13.2 % (ref 11.5–15.5)
WBC: 10.4 10*3/uL (ref 4.0–10.5)
nRBC: 0 % (ref 0.0–0.2)

## 2018-03-24 LAB — PHOSPHORUS: Phosphorus: 3.6 mg/dL (ref 2.5–4.6)

## 2018-03-24 LAB — MAGNESIUM: Magnesium: 2 mg/dL (ref 1.7–2.4)

## 2018-03-24 LAB — COMPREHENSIVE METABOLIC PANEL WITH GFR
ALT: 21 U/L (ref 0–44)
AST: 17 U/L (ref 15–41)
Albumin: 3.1 g/dL — ABNORMAL LOW (ref 3.5–5.0)
Alkaline Phosphatase: 109 U/L (ref 38–126)
Anion gap: 11 (ref 5–15)
BUN: 10 mg/dL (ref 8–23)
CO2: 26 mmol/L (ref 22–32)
Calcium: 9.2 mg/dL (ref 8.9–10.3)
Chloride: 102 mmol/L (ref 98–111)
Creatinine, Ser: 0.51 mg/dL (ref 0.44–1.00)
GFR calc Af Amer: >60 mL/min (ref >60)
GFR calc non Af Amer: >60 mL/min (ref >60)
Glucose, Bld: 106 mg/dL — ABNORMAL HIGH (ref 70–99)
Potassium: 4.1 mmol/L (ref 3.5–5.1)
Sodium: 139 mmol/L (ref 135–145)
Total Bilirubin: 0.9 mg/dL (ref 0.3–1.2)
Total Protein: 6.6 g/dL (ref 6.5–8.1)

## 2018-03-24 MED ORDER — IOHEXOL 300 MG/ML  SOLN
100.0000 mL | Freq: Once | INTRAMUSCULAR | Status: AC | PRN
  Administered 2018-03-24: 100 mL via INTRAVENOUS

## 2018-03-24 MED ORDER — HYDROCODONE-ACETAMINOPHEN 7.5-325 MG/15ML PO SOLN: 15.0000 mL | Freq: Four times a day (QID) | ORAL | 0 refills | Status: DC | PRN

## 2018-03-24 NOTE — Progress Notes (Addendum)
PT seen and examined. She feels better today but has persistent pain. HR still 100. She is still taking the trazodone at night at high doses. I again d/w her about the need to decrease the dose, sine it is known to cause orthostatic hypotension. Another alternative might be benadryl. She is being taking PO, but not enough calories. She took the Pedialyte well.   PE : walks w/o difficulty into the room. Sister is present. She is chronically ill She has lost weight. Chest: CTA,  Abd: soft, mild TTP LUQ, g tube and J tube flushed w saline. Patent. No peritonitis. Wound healing well  A/p  Abd pain, given the potential for abscess or any other pathology, I am ordering a CT scan A/P. Depending on findings may need to be admitted for further rx Failure to thrive, I spent at least 20 minutes teaching them about how to use a feeding jejunostomy. Pt was very hesitant to use it. Sister understands the concept and is willing to help. Unfortunately her social situation is difficult and her son does not help ( ETOH dependent). I did teach them about the importance of keeping up w nutritional needs. No need for emergent surgical intervention . We will dispo once we have completed w/u W/U reviewed, seroma mediastinum, no evidence of leak. We will refill her hydrocodone liquid. RTC next week. D/W her about enteral feeds

## 2018-03-24 NOTE — Patient Instructions (Signed)
Please arrange for home health care to provide feeding supplements through G-Tube.   You may eat soft foods and drink by mouth as usual as well as feeding supplements through the feeding tube.     We will order a stat CT scan today and depending on what it shows we may need to admit you to the hospital.   We will need for you to go to the lab today as well. After you have the CT scan please have your labs done.    You will need to drink ensure or boost or carnation breakfast multiple times throughout the day.   You may want to try I/2 the dose of trazodone.

## 2018-03-24 NOTE — Addendum Note (Signed)
Addended by: Caroleen Hamman F on: 03/24/2018 04:04 PM   Modules accepted: Orders

## 2018-03-27 ENCOUNTER — Telehealth: Payer: Self-pay

## 2018-03-27 NOTE — Telephone Encounter (Signed)
Patient had Hiatal hernia repair done 03/09/18 with Dr.Pabon.  Patient states she is unable to keep soft foods or liquid down.  She was instructed to place Ensure and feed into g-tube when in the office on Thursday however she stated she tried to do this but the liquid ran out all over the bedding.  She states she feels she should be admitted due to not keeping fluids down.   I spoke with Dr.Pabon and patient was instructed to go to the emergency room or be placed on schedule tomorrow to be seen.   Patient refuses to go to the ED and was placed on Dr.Davis schedule 03/28/2018 and possibly direct admit from office.   Patient called back to say that her sister will bring her to the appointment 03/28/2018.

## 2018-03-28 ENCOUNTER — Ambulatory Visit (INDEPENDENT_AMBULATORY_CARE_PROVIDER_SITE_OTHER): Payer: Federal, State, Local not specified - PPO | Admitting: Surgery

## 2018-03-28 ENCOUNTER — Inpatient Hospital Stay
Admission: AD | Admit: 2018-03-28 | Discharge: 2018-04-04 | DRG: 640 | Disposition: A | Discharge status: Home Health | Payer: Medicare Other | Source: Ambulatory Visit | Attending: Surgery | Admitting: Surgery

## 2018-03-28 ENCOUNTER — Encounter: Payer: Self-pay | Admitting: Surgery

## 2018-03-28 ENCOUNTER — Other Ambulatory Visit: Payer: Self-pay

## 2018-03-28 VITALS — BP 125/91 | HR 98 | Temp 94.8°F | Ht 65.0 in | Wt 117.8 lb

## 2018-03-28 DIAGNOSIS — E860 Dehydration: Principal | ICD-10-CM | POA: Diagnosis present

## 2018-03-28 DIAGNOSIS — M810 Age-related osteoporosis without current pathological fracture: Secondary | ICD-10-CM | POA: Diagnosis present

## 2018-03-28 DIAGNOSIS — Z87891 Personal history of nicotine dependence: Secondary | ICD-10-CM

## 2018-03-28 DIAGNOSIS — E43 Unspecified severe protein-calorie malnutrition: Secondary | ICD-10-CM | POA: Diagnosis present

## 2018-03-28 DIAGNOSIS — Z818 Family history of other mental and behavioral disorders: Secondary | ICD-10-CM | POA: Diagnosis not present

## 2018-03-28 DIAGNOSIS — Z811 Family history of alcohol abuse and dependence: Secondary | ICD-10-CM

## 2018-03-28 DIAGNOSIS — Z8719 Personal history of other diseases of the digestive system: Secondary | ICD-10-CM | POA: Diagnosis not present

## 2018-03-28 DIAGNOSIS — Z8349 Family history of other endocrine, nutritional and metabolic diseases: Secondary | ICD-10-CM

## 2018-03-28 DIAGNOSIS — Y838 Other surgical procedures as the cause of abnormal reaction of the patient, or of later complication, without mention of misadventure at the time of the procedure: Secondary | ICD-10-CM | POA: Diagnosis present

## 2018-03-28 DIAGNOSIS — Z4889 Encounter for other specified surgical aftercare: Secondary | ICD-10-CM

## 2018-03-28 DIAGNOSIS — Z8249 Family history of ischemic heart disease and other diseases of the circulatory system: Secondary | ICD-10-CM

## 2018-03-28 DIAGNOSIS — L89151 Pressure ulcer of sacral region, stage 1: Secondary | ICD-10-CM | POA: Diagnosis present

## 2018-03-28 DIAGNOSIS — R64 Cachexia: Secondary | ICD-10-CM | POA: Diagnosis present

## 2018-03-28 DIAGNOSIS — Z841 Family history of disorders of kidney and ureter: Secondary | ICD-10-CM

## 2018-03-28 DIAGNOSIS — K91872 Postprocedural seroma of a digestive system organ or structure following a digestive system procedure: Secondary | ICD-10-CM | POA: Diagnosis present

## 2018-03-28 DIAGNOSIS — F410 Panic disorder [episodic paroxysmal anxiety] without agoraphobia: Secondary | ICD-10-CM | POA: Diagnosis present

## 2018-03-28 DIAGNOSIS — Z681 Body mass index (BMI) 19 or less, adult: Secondary | ICD-10-CM

## 2018-03-28 DIAGNOSIS — R079 Chest pain, unspecified: Secondary | ICD-10-CM

## 2018-03-28 DIAGNOSIS — Z9889 Other specified postprocedural states: Secondary | ICD-10-CM | POA: Diagnosis not present

## 2018-03-28 DIAGNOSIS — J90 Pleural effusion, not elsewhere classified: Secondary | ICD-10-CM

## 2018-03-28 DIAGNOSIS — R627 Adult failure to thrive: Secondary | ICD-10-CM | POA: Diagnosis present

## 2018-03-28 DIAGNOSIS — K5900 Constipation, unspecified: Secondary | ICD-10-CM | POA: Diagnosis not present

## 2018-03-28 DIAGNOSIS — R45851 Suicidal ideations: Secondary | ICD-10-CM | POA: Diagnosis present

## 2018-03-28 DIAGNOSIS — Z825 Family history of asthma and other chronic lower respiratory diseases: Secondary | ICD-10-CM | POA: Diagnosis not present

## 2018-03-28 DIAGNOSIS — I959 Hypotension, unspecified: Secondary | ICD-10-CM | POA: Diagnosis not present

## 2018-03-28 DIAGNOSIS — E785 Hyperlipidemia, unspecified: Secondary | ICD-10-CM | POA: Diagnosis present

## 2018-03-28 DIAGNOSIS — F331 Major depressive disorder, recurrent, moderate: Secondary | ICD-10-CM | POA: Diagnosis present

## 2018-03-28 DIAGNOSIS — R112 Nausea with vomiting, unspecified: Secondary | ICD-10-CM | POA: Diagnosis present

## 2018-03-28 DIAGNOSIS — K449 Diaphragmatic hernia without obstruction or gangrene: Secondary | ICD-10-CM

## 2018-03-28 DIAGNOSIS — Z931 Gastrostomy status: Secondary | ICD-10-CM | POA: Diagnosis not present

## 2018-03-28 DIAGNOSIS — G4700 Insomnia, unspecified: Secondary | ICD-10-CM | POA: Diagnosis present

## 2018-03-28 DIAGNOSIS — L8990 Pressure ulcer of unspecified site, unspecified stage: Secondary | ICD-10-CM | POA: Diagnosis present

## 2018-03-28 DIAGNOSIS — F5101 Primary insomnia: Secondary | ICD-10-CM | POA: Diagnosis not present

## 2018-03-28 LAB — COMPREHENSIVE METABOLIC PANEL
ALBUMIN: 2.9 g/dL — AB (ref 3.5–5.0)
ALT: 15 U/L (ref 0–44)
AST: 17 U/L (ref 15–41)
Alkaline Phosphatase: 110 U/L (ref 38–126)
Anion gap: 6 (ref 5–15)
BILIRUBIN TOTAL: 0.8 mg/dL (ref 0.3–1.2)
BUN: 9 mg/dL (ref 8–23)
CO2: 27 mmol/L (ref 22–32)
Calcium: 8.7 mg/dL — ABNORMAL LOW (ref 8.9–10.3)
Chloride: 103 mmol/L (ref 98–111)
Creatinine, Ser: 0.59 mg/dL (ref 0.44–1.00)
GFR calc Af Amer: >60 mL/min (ref >60)
GFR calc non Af Amer: >60 mL/min (ref >60)
GLUCOSE: 100 mg/dL — AB (ref 70–99)
Potassium: 4.1 mmol/L (ref 3.5–5.1)
Sodium: 136 mmol/L (ref 135–145)
Total Protein: 6.5 g/dL (ref 6.5–8.1)

## 2018-03-28 LAB — MAGNESIUM: Magnesium: 2.1 mg/dL (ref 1.7–2.4)

## 2018-03-28 LAB — PHOSPHORUS: Phosphorus: 3.9 mg/dL (ref 2.5–4.6)

## 2018-03-28 MED ORDER — PANTOPRAZOLE SODIUM 40 MG PO TBEC
40.0000 mg | DELAYED_RELEASE_TABLET | Freq: Every day | ORAL | Status: DC
  Administered 2018-03-28 – 2018-04-04 (×8): 40 mg via ORAL
  Filled 2018-03-28 (×8): qty 1

## 2018-03-28 MED ORDER — PAROXETINE HCL 20 MG PO TABS
20.0000 mg | ORAL_TABLET | Freq: Every day | ORAL | Status: DC
  Administered 2018-03-28 – 2018-04-02 (×6): 20 mg via ORAL
  Filled 2018-03-28 (×9): qty 1

## 2018-03-28 MED ORDER — ENOXAPARIN SODIUM 40 MG/0.4ML ~~LOC~~ SOLN
40.0000 mg | SUBCUTANEOUS | Status: DC
  Administered 2018-03-28 – 2018-04-02 (×6): 40 mg via SUBCUTANEOUS
  Filled 2018-03-28 (×6): qty 0.4

## 2018-03-28 MED ORDER — ENSURE ENLIVE PO LIQD
237.0000 mL | Freq: Two times a day (BID) | ORAL | Status: DC
  Administered 2018-03-29 – 2018-04-04 (×12): 237 mL via ORAL

## 2018-03-28 MED ORDER — SIMVASTATIN 20 MG PO TABS
20.0000 mg | ORAL_TABLET | Freq: Every day | ORAL | Status: DC
  Administered 2018-03-28 – 2018-04-03 (×7): 20 mg via ORAL
  Filled 2018-03-28 (×9): qty 1

## 2018-03-28 MED ORDER — MORPHINE SULFATE (PF) 2 MG/ML IV SOLN
2.0000 mg | INTRAVENOUS | Status: DC | PRN
  Administered 2018-03-28 – 2018-04-02 (×7): 2 mg via INTRAVENOUS
  Filled 2018-03-28 (×7): qty 1

## 2018-03-28 MED ORDER — LACTATED RINGERS IV SOLN
INTRAVENOUS | Status: DC
  Administered 2018-03-28 – 2018-03-29 (×3): via INTRAVENOUS

## 2018-03-28 MED ORDER — SIMETHICONE 80 MG PO CHEW
80.0000 mg | CHEWABLE_TABLET | Freq: Every day | ORAL | Status: DC | PRN
  Administered 2018-03-28 – 2018-04-04 (×4): 80 mg via ORAL
  Filled 2018-03-28 (×5): qty 1

## 2018-03-28 MED ORDER — BUSPIRONE HCL 10 MG PO TABS
10.0000 mg | ORAL_TABLET | Freq: Two times a day (BID) | ORAL | Status: DC
  Administered 2018-03-28 – 2018-04-04 (×15): 10 mg via ORAL
  Filled 2018-03-28 (×17): qty 1

## 2018-03-28 MED ORDER — ONDANSETRON 4 MG PO TBDP: 4.0000 mg | ORAL_TABLET | Freq: Four times a day (QID) | ORAL | Status: DC | PRN

## 2018-03-28 MED ORDER — IBUPROFEN 200 MG PO TABS
200.0000 mg | ORAL_TABLET | Freq: Every day | ORAL | Status: DC | PRN
  Filled 2018-03-28 (×2): qty 2

## 2018-03-28 MED ORDER — ONDANSETRON HCL 4 MG/2ML IJ SOLN
4.0000 mg | Freq: Four times a day (QID) | INTRAMUSCULAR | Status: DC | PRN
  Administered 2018-03-30: 4 mg via INTRAVENOUS
  Filled 2018-03-28: qty 2

## 2018-03-28 MED ORDER — SODIUM CHLORIDE 0.9 % IV BOLUS
1000.0000 mL | Freq: Once | INTRAVENOUS | Status: AC
  Administered 2018-03-28: 1000 mL via INTRAVENOUS

## 2018-03-28 MED ORDER — METOPROLOL TARTRATE 5 MG/5ML IV SOLN
5.0000 mg | Freq: Four times a day (QID) | INTRAVENOUS | Status: DC | PRN
  Filled 2018-03-28: qty 5

## 2018-03-28 MED ORDER — TRAZODONE HCL 100 MG PO TABS
100.0000 mg | ORAL_TABLET | Freq: Every day | ORAL | Status: DC
  Administered 2018-03-28 – 2018-03-29 (×2): 100 mg via ORAL
  Filled 2018-03-28 (×3): qty 1

## 2018-03-28 MED ORDER — HYDROCODONE-ACETAMINOPHEN 7.5-325 MG/15ML PO SOLN
15.0000 mL | Freq: Four times a day (QID) | ORAL | Status: DC | PRN
  Administered 2018-03-28 – 2018-04-04 (×11): 15 mL via ORAL
  Filled 2018-03-28 (×15): qty 15

## 2018-03-28 NOTE — H&P (Signed)
SURGICAL ADMISSION HISTORY & PHYSICAL  HPI:  68 y.o. Female presents to clinic for subsequent post-op follow-up 19 Days s/p attempted robotic-assisted type 3 paraesophageal hernia repair, converted to open repair with repair of intra-operative gastro-esophageal junction perforation, Nissen fundoplication, and placement of gastrojejunostomy tube for nutritional support (Pabon, 03/09/2018). Patient reports she's lost 80 lbs over the past 1 year, including long-prior to her recent surgery and has lost another 3 lbs over the past 5 days, during which time she says she's been belching frequently and can only tolerate small sips of water with vomiting of all other foods/liquids by mouth. She adds that she feels lightheaded and lethargic, states she feels she needs to be admitted to [facility].  Utilization of her gastrojejunostomy tube for nutritional support and hydration have been advised during multiple recent encounters, but patient states she doesn't know how and her sister (also present today) says she is not available consistently enough for her sister to rely on her for nutrition, adding the last time she attempted to instill tube feeds through her sister's gastrojejunostomy tube, the tube feeds spilled all over her and her sister. Patient reports constant LUQ pain, though says her pain is essentially unchanged despite having decreased her narcotic pain medication frequency to only twice daily. Patient otherwise last passed a small "sticky" BM 2 days ago and denies fever/chills, CP, or SOB.  Review of Systems:  Constitutional: denies fever/chills  ENT: denies sore throat, hearing problems  Respiratory: denies shortness of breath, wheezing  Cardiovascular: denies chest pain, palpitations  Gastrointestinal: abdominal pain, N/V, and bowel function as per interval history Skin: Denies any other rashes or skin discolorations except post-surgical wounds as per interval history  Vital Signs:  BP (!) 125/91    Pulse 98   Temp (!) 94.8 F (34.9 C) (Temporal)   Ht 5\' 5"  (1.651 m)   Wt 117 lb 12.8 oz (53.4 kg)   LMP  (LMP Unknown)   SpO2 100%   BMI 19.60 kg/m    Physical Exam:  Constitutional:  -- Cachectic-appearing body habitus  -- Awake, alert, and oriented x3  Pulmonary:  -- No crackles -- Equal breath sounds bilaterally -- Breathing non-labored at rest Cardiovascular:  -- S1, S2 present  -- No pericardial rubs  Gastrointestinal:  -- Soft and non-distended with moderate focal peri-gastrojejunostomy tube tenderness to palpation and mild epigastric tenderness with deep palpation, no guarding/rebound tenderness -- Post-surgical incisions all very well-approximated without any peri-incisional erythema or drainage -- No abdominal masses appreciated, pulsatile or otherwise  Musculoskeletal / Integumentary:  -- Wounds or skin discoloration: None appreciated except post-surgical incisions as described above (GI) -- Extremities: B/L UE and LE FROM, hands and feet warm, no edema   Imaging:  CT Abdomen and Pelvis with Contrast (03/24/2018) - images personally reviewed with patient and her sister bedside 1. Large amount of fluid in the lower chest at the previous hiatal hernia cavity. There is no gas within this fluid and presumably this represents postoperative ascites or seroma. 2. Small pleural effusions, left side greater than right. 3. Postsurgical changes compatible with Nissen fundoplication and placement of a gastrojejunostomy tube. 4. **An incidental finding of potential clinical significance has been found. Enlargement of the left renal vein lateral to the SMA. This finding is most likely related to compression from the superior mesenteric artery. This could be an incidental finding but this vascular configuration can be associated with Renal Nutcracker Syndrome in the appropriate clinical setting.**  Assessment:  68 y.o. yo  Female with a problem list including...       Patient Active Problem List   Diagnosis Date Noted  . Protein-calorie malnutrition, severe 03/11/2018  . S/P repair of paraesophageal hernia 03/09/2018  . Benign neoplasm of ascending colon   . Osteoporosis 07/27/2017  . Tobacco abuse 07/27/2017  . Painful orthopaedic hardware ([place]) 06/24/2017  . Overweight with body mass index (BMI) 25.0-29.9 05/18/2017  . 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 Insomnia 01/12/2017  . Anemia 01/12/2017  . Status post shoulder replacement 12/27/2016  . Pressure injury of skin 12/27/2016  . S/P ORIF (open reduction internal fixation) fracture 11/23/2016  . Proximal humerus fracture 11/23/2016  . Panic attack 08/17/2016  . Abdominal pain 05/21/2016  . Special screening for malignant neoplasms, colon   . Benign neoplasm of descending colon   . Benign neoplasm of sigmoid colon   . Gastroesophageal reflux disease without esophagitis 06/13/2014  . H/O hypercholesterolemia 06/13/2014  . Depression, major, recurrent, moderate ([place]) 06/13/2014  . History of breast cancer 03/12/2013    presents to clinic dehydration, lethargy, and acute on chronic severe protein and caloric malnutrition 19 Days s/p attempted robotic-assisted type 3 paraesophageal hernia repair, converted to open repair with repair of intra-operative gastro-esophageal junction perforation, Nissen fundoplication, and placement of gastrojejunostomy tube for nutritional support (Pabon, 03/09/2018).  Plan:  - will admit to [facility] for primarily hydration             - CBC and CMP to be rechecked upon admission  - recent CT reviewed with patient and her sister and relationship to symptoms discussed - importance of hydration and nutrition via gastrojejunostomy tube were emphasized             - patient and all of her potential caregivers should be educated and demonstrate proficiency with utilization of her gastrojejunostomy for hydration  and nutritional access - this encounter and the above recommendations/plans were discussed with Dr. Dahlia Byes and surgical PA Thedore Mins)             - anticipate outpatient follow-up with Dr. Dahlia Byes upon discharge from [facility]  All of the above recommendations were discussed with the patient and patient's sister, and all of patient's and family's questions were answered to their expressed satisfaction.  -- Marilynne Drivers Rosana Hoes, MD, [place]: [facility] - Partnering for exceptional care. Office: [phone]

## 2018-03-28 NOTE — Patient Instructions (Signed)
Patient will need to be direct admitted to the hospital for dehydration.  Call the office with any questions or concerns.

## 2018-03-28 NOTE — Progress Notes (Signed)
Surgical Clinic Progress/Follow-up Note   HPI:  68 y.o. Female presents to clinic for subsequent post-op follow-up 19 Days s/p attempted robotic-assisted type 3 paraesophageal hernia repair, converted to open repair with repair of intra-operative gastro-esophageal junction perforation, Nissen fundoplication, and placement of gastrojejunostomy tube for nutritional support (Pabon, 03/09/2018). Patient reports she's lost 80 lbs over the past 1 year, including long-prior to her recent surgery and has lost another 3 lbs over the past 5 days, during which time she says she's been belching frequently and can only tolerate small sips of water with vomiting of all other foods/liquids by mouth. She adds that she feels lightheaded and lethargic, states she feels she needs to be admitted to [facility].  Utilization of her gastrojejunostomy tube for nutritional support and hydration have been advised during multiple recent encounters, but patient states she doesn't know how and her sister (also present today) says she is not available consistently enough for her sister to rely on her for nutrition, adding the last time she attempted to instill tube feeds through her sister's gastrojejunostomy tube, the tube feeds spilled all over her and her sister. Patient reports constant LUQ pain, though says her pain is essentially unchanged despite having decreased her narcotic pain medication frequency to only twice daily. Patient otherwise last passed a small "sticky" BM 2 days ago and denies fever/chills, CP, or SOB.  Review of Systems:  Constitutional: denies fever/chills  ENT: denies sore throat, hearing problems  Respiratory: denies shortness of breath, wheezing  Cardiovascular: denies chest pain, palpitations  Gastrointestinal: abdominal pain, N/V, and bowel function as per interval history Skin: Denies any other rashes or skin discolorations except post-surgical wounds as per interval history  Vital Signs:  BP (!)  125/91   Pulse 98   Temp (!) 94.8 F (34.9 C) (Temporal)   Ht 5\' 5"  (1.651 m)   Wt 117 lb 12.8 oz (53.4 kg)   LMP  (LMP Unknown)   SpO2 100%   BMI 19.60 kg/m    Physical Exam:  Constitutional:  -- Cachectic-appearing body habitus  -- Awake, alert, and oriented x3  Pulmonary:  -- No crackles -- Equal breath sounds bilaterally -- Breathing non-labored at rest Cardiovascular:  -- S1, S2 present  -- No pericardial rubs  Gastrointestinal:  -- Soft and non-distended with moderate focal peri-gastrojejunostomy tube tenderness to palpation and mild epigastric tenderness with deep palpation, no guarding/rebound tenderness -- Post-surgical incisions all very well-approximated without any peri-incisional erythema or drainage -- No abdominal masses appreciated, pulsatile or otherwise  Musculoskeletal / Integumentary:  -- Wounds or skin discoloration: None appreciated except post-surgical incisions as described above (GI) -- Extremities: B/L UE and LE FROM, hands and feet warm, no edema   Imaging:  CT Abdomen and Pelvis with Contrast (03/24/2018) - images personally reviewed with patient and her sister bedside 1. Large amount of fluid in the lower chest at the previous hiatal hernia cavity. There is no gas within this fluid and presumably this represents postoperative ascites or seroma. 2. Small pleural effusions, left side greater than right. 3. Postsurgical changes compatible with Nissen fundoplication and placement of a gastrojejunostomy tube. 4. **An incidental finding of potential clinical significance has been found. Enlargement of the left renal vein lateral to the SMA. This finding is most likely related to compression from the superior mesenteric artery. This could be an incidental finding but this vascular configuration can be associated with Renal Nutcracker Syndrome in the appropriate clinical setting.**  Assessment:  68 y.o. yo  Female with a problem list including...   Patient Active Problem List   Diagnosis Date Noted  . Protein-calorie malnutrition, severe 03/11/2018  . S/P repair of paraesophageal hernia 03/09/2018  . Benign neoplasm of ascending colon   . Osteoporosis 07/27/2017  . Tobacco abuse 07/27/2017  . Painful orthopaedic hardware ([place]) 06/24/2017  . Overweight with body mass index (BMI) 25.0-29.9 05/18/2017  . 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 Insomnia 01/12/2017  . Anemia 01/12/2017  . Status post shoulder replacement 12/27/2016  . Pressure injury of skin 12/27/2016  . S/P ORIF (open reduction internal fixation) fracture 11/23/2016  . Proximal humerus fracture 11/23/2016  . Panic attack 08/17/2016  . Abdominal pain 05/21/2016  . Special screening for malignant neoplasms, colon   . Benign neoplasm of descending colon   . Benign neoplasm of sigmoid colon   . Gastroesophageal reflux disease without esophagitis 06/13/2014  . H/O hypercholesterolemia 06/13/2014  . Depression, major, recurrent, moderate ([place]) 06/13/2014  . History of breast cancer 03/12/2013    presents to clinic dehydration, lethargy, and acute on chronic severe protein and caloric malnutrition 19 Days s/p attempted robotic-assisted type 3 paraesophageal hernia repair, converted to open repair with repair of intra-operative gastro-esophageal junction perforation, Nissen fundoplication, and placement of gastrojejunostomy tube for nutritional support (Pabon, 03/09/2018).  Plan:              - will admit to [facility] for primarily hydration  - CBC and CMP to be rechecked upon admission              - recent CT reviewed with patient and her sister and relationship to symptoms discussed             - importance of hydration and nutrition via gastrojejunostomy tube were emphasized  - patient and all of her potential caregivers should be educated and demonstrate proficiency with utilization of her gastrojejunostomy for hydration and nutritional access              - this encounter and the above recommendations/plans were discussed with Dr. Dahlia Byes and surgical PA Thedore Mins)  - anticipate outpatient follow-up with Dr. Dahlia Byes upon discharge from [facility]  All of the above recommendations were discussed with the patient and patient's sister, and all of patient's and family's questions were answered to their expressed satisfaction.  -- Marilynne Drivers Rosana Hoes, MD, [place]: [facility] - Partnering for exceptional care. Office: [phone]

## 2018-03-28 NOTE — Progress Notes (Signed)
Patient transferred to [facility] via wheelchair, pushed by staff.

## 2018-03-28 NOTE — Progress Notes (Addendum)
Patient to be transferred to room 212. Family updated. Transporter en route. Report given to Moody Bruins., RN on 2C.

## 2018-03-29 ENCOUNTER — Inpatient Hospital Stay: Payer: Medicare Other

## 2018-03-29 ENCOUNTER — Encounter: Payer: Medicare Other | Admitting: Surgery

## 2018-03-29 LAB — BASIC METABOLIC PANEL
Anion gap: 6 (ref 5–15)
BUN: 7 mg/dL — ABNORMAL LOW (ref 8–23)
CALCIUM: 8.5 mg/dL — AB (ref 8.9–10.3)
CO2: 28 mmol/L (ref 22–32)
Chloride: 103 mmol/L (ref 98–111)
Creatinine, Ser: 0.5 mg/dL (ref 0.44–1.00)
Glucose, Bld: 102 mg/dL — ABNORMAL HIGH (ref 70–99)
Potassium: 5.1 mmol/L (ref 3.5–5.1)
Sodium: 137 mmol/L (ref 135–145)

## 2018-03-29 LAB — CBC
HCT: 32.6 % — ABNORMAL LOW (ref 36.0–46.0)
Hemoglobin: 10.7 g/dL — ABNORMAL LOW (ref 12.0–15.0)
MCH: 31.3 pg (ref 26.0–34.0)
MCHC: 32.8 g/dL (ref 30.0–36.0)
MCV: 95.3 fL (ref 80.0–100.0)
PLATELETS: 474 10*3/uL — AB (ref 150–400)
RBC: 3.42 MIL/uL — ABNORMAL LOW (ref 3.87–5.11)
RDW: 12.7 % (ref 11.5–15.5)
WBC: 6.9 10*3/uL (ref 4.0–10.5)
nRBC: 0 % (ref 0.0–0.2)

## 2018-03-29 MED ORDER — CYCLOBENZAPRINE HCL 10 MG PO TABS
5.0000 mg | ORAL_TABLET | Freq: Three times a day (TID) | ORAL | Status: DC | PRN
  Administered 2018-04-04: 5 mg via ORAL
  Filled 2018-03-29: qty 1

## 2018-03-29 MED ORDER — FREE WATER
30.0000 mL | Freq: Four times a day (QID) | Status: DC
  Administered 2018-03-29 – 2018-03-30 (×4): 30 mL

## 2018-03-29 MED ORDER — ADULT MULTIVITAMIN LIQUID CH
15.0000 mL | Freq: Every day | ORAL | Status: DC
  Administered 2018-03-30 – 2018-04-04 (×6): 15 mL
  Filled 2018-03-29 (×6): qty 15

## 2018-03-29 MED ORDER — GABAPENTIN 300 MG PO CAPS
300.0000 mg | ORAL_CAPSULE | Freq: Three times a day (TID) | ORAL | Status: DC
  Administered 2018-03-29 – 2018-04-04 (×19): 300 mg via ORAL
  Filled 2018-03-29 (×19): qty 1

## 2018-03-29 MED ORDER — VITAMIN C 500 MG PO TABS
250.0000 mg | ORAL_TABLET | Freq: Two times a day (BID) | ORAL | Status: DC
  Administered 2018-03-29 – 2018-04-04 (×11): 250 mg
  Filled 2018-03-29 (×11): qty 1

## 2018-03-29 MED ORDER — OSMOLITE 1.5 CAL PO LIQD
120.0000 mL | Freq: Four times a day (QID) | ORAL | Status: DC
  Administered 2018-03-29 – 2018-03-30 (×3): 120 mL

## 2018-03-29 NOTE — Plan of Care (Signed)
  Problem: Clinical Measurements: Goal: Ability to maintain clinical measurements within normal limits will improve Outcome: Progressing Goal: Will remain free from infection Outcome: Progressing Goal: Diagnostic test results will improve Outcome: Progressing Goal: Respiratory complications will improve Outcome: Progressing Goal: Cardiovascular complication will be avoided Outcome: Progressing   Problem: Activity: Goal: Risk for activity intolerance will decrease Outcome: Progressing   Problem: Pain Managment: Goal: General experience of comfort will improve Outcome: Progressing  Attempting to control patients pain with PRN medications.

## 2018-03-29 NOTE — Progress Notes (Signed)
Patient requesting pain medication and there was some discussion about changing her frequency of hycet. MD at bedside. However, dose is not yet due and patient prefers morphine. Per MD we will keep oral pain medication as scheduled and will give morphine at this time. Due to size of hycet container I am unable to return it. Wasted all 62ml of medication with 2nd verifying RN.

## 2018-03-29 NOTE — Progress Notes (Signed)
Initial Nutrition Assessment  DOCUMENTATION CODES:   Severe malnutrition in context of chronic illness  INTERVENTION:   Osmolite 1.5- Please provide 4 cans daily via G-portion of pt's tube- Flush with 28m of water before and after each tube feed  Regimen provides 1420kcal/day (meets 95% of pt's estimated needs), 60g/day protein (meets 75% of estimated needs) and 1122mday free water (meets 86% of estimated needs)   Provide liquid MVI daily via tube   Provide vitamin C 25068mID via tube   NUTRITION DIAGNOSIS:   Severe Malnutrition related to chronic illness(s/p nissen procedure for hiatal hernia ) as evidenced by 28 percent weight loss in 1 year, moderate to severe fat depletions, moderate to severe muscle depletions.  GOAL:   Patient will meet greater than or equal to 90% of their needs  MONITOR:   PO intake, Supplement acceptance, Labs, Weight trends, TF tolerance, Skin, I & O's  REASON FOR ASSESSMENT:   Consult Enteral/tube feeding initiation and management  ASSESSMENT:   67 45o. female readmitted with poor nutritional and hydration status s/p open hital hernia repair and GE junction perforation repair with nissan fundoplication and placement of gastrojejnostomy tube.   Met with pt in room today. RD familiar with this pt from recent previous admit. Pt reports poor appetite and oral intake ever since discharge 9 days ago. Pt reports eating mainly soft foods such as grits, yogurt and soups. Pt reports drinking 2 Ensure per day when able. Pt reports intermittent vomiting; pt describes this as food getting stuck in her lower esophagus and then regurgitating it back up. Pt reports that gagging happens even with thin liquids. Pt reports continued gas, bloating and belching. Pt has not been taking the simethicone at home as she reports that this does not help. Pt belching during RD visit today. Pt ate 75% of her grits for breakfast this morning and drank an Ensure. Per chart, pt  has lost 49lbs(29%) over the past year and 3lbs(2%) since discharge. This is significant weight loss. Pt is not able to eat enough via oral intake to meet her needs. Pt with 18 59 Moss gastrojejunostomy tube placed to the ligament of treitz. The J-portion of pt's tube is clogged but the G-portion is available to use for feeds. Pt has not been using the tube for feedings as she is nervous about this and does not have good support at home. Pt has not been flushing the G-tube at home either. Pt has now developed a sacral wound. RD had long discussion with pt today regarding her weight loss and her inability to meet her needs via oral intake. Discussed the loss of lean muscle and how this could be debilitating for her. Provided different options for tube feeds including bolus feeds, overnight feeds or continuous feeds. Explained that pt can still continue to take food by mouth and that tube can be easily removed once she she able to meet her needs via oral intake. Pt continues to be nervous about tube feeds. Plan is for RN to meet with pt and her sister today to show them how to use the tube for feeding. Will start with bolus feeds for now and see how pt tolerates; can change this to continuous if patient ends up discharging to SNF.   Medications reviewed and include: lovenox, protonix, paxil, LRS _0 /hr  Labs reviewed: BUN 7(L) P 3.9 wnl, Mg 2.1 wnl Hgb 10.7(L), Hct 32.6(L)  NUTRITION - FOCUSED PHYSICAL EXAM:    Most Recent Value  Orbital  Region  Mild depletion  Upper Arm Region  Moderate depletion  Thoracic and Lumbar Region  Moderate depletion  Buccal Region  Moderate depletion  Temple Region  Moderate depletion  Clavicle Bone Region  Severe depletion  Clavicle and Acromion Bone Region  Severe depletion  Scapular Bone Region  Moderate depletion  Dorsal Hand  Moderate depletion  Patellar Region  Severe depletion  Anterior Thigh Region  Severe depletion  Posterior Calf Region  Moderate  depletion  Edema (RD Assessment)  None  Hair  Reviewed  Eyes  Reviewed  Mouth  Reviewed  Skin  Reviewed Vistilia.Gaudier ]  Nails  Reviewed     Diet Order:   Diet Order            Diet full liquid Room service appropriate? Yes; Fluid consistency: Thin  Diet effective now             EDUCATION NEEDS:   Education needs have been addressed  Skin:  Skin Assessment: Reviewed RN Assessment(Stage I sacrum, incision abdomen )  Last BM:  2/29  Height:   Ht Readings from Last 1 Encounters:  03/28/18 _0  (1.651 m)    Weight:   Wt Readings from Last 1 Encounters:  03/28/18 53.4 kg    Ideal Body Weight:  56.8 kg  BMI:  Body mass index is 19.6 kg/m.  Estimated Nutritional Needs:   Kcal:  1500-1700kcal/day   Protein:  80-85g/day   Fluid:  1.3L/day   Koleen Distance MS, RD, LDN Pager #- [phone] Office#- [phone] After Hours Pager: [phone]

## 2018-03-29 NOTE — Evaluation (Signed)
Physical Therapy Evaluation Patient Details Name: Chelsey [name] MRN: [id number] DOB: 19-Mar-1950 Today's Date: 03/29/2018   History of Present Illness  admitted as direct admit from surgical office due to abdominal pain, dehydration and difficulty managing tube feeds.  Of note, patient s/p open hital hernia repair and GE junction perforation repair with nissan fundoplication and placement of gastrojejnostomy tube during previous admission (03/09/18).   Clinical Impression  Upon evaluation, patient alert and oriented; follows commands and demonstrates good efforts with mobility. Patient generally weak and deconditioned due to acute illness, but no focal weakness appreciated.  Generalized soreness in abdomen, unchanged with mobility efforts.  Able to complete bed mobility with mod indep; sit/stand, basic transfers and gait (120') without assist device, cga/close sup.  Guarded trunk posturing with limited rotation and arm sway; higher level balance deficits evident. Anticipate continued improvement with additional strength/mobility training. Would benefit from skilled PT to address above deficits and promote optimal return to PLOF; Recommend transition to [facility] upon discharge from acute hospitalization.     Follow Up Recommendations Home health PT(may consider transition to outpatient as patient improves)    Equipment Recommendations       Recommendations for Other Services       Precautions / Restrictions Precautions Precautions: Fall Restrictions Weight Bearing Restrictions: No      Mobility  Bed Mobility Overal bed mobility: Modified Independent                Transfers Overall transfer level: Needs assistance Equipment used: None Transfers: Sit to/from Stand Sit to Stand: Min guard            Ambulation/Gait Ambulation/Gait assistance: Min guard Gait Distance (Feet): 120 Feet Assistive device: None       General Gait Details: reciprocal stepping pattern,  narrowed BOS with guarded trunk posturing, limited rotation and arm swing  Stairs            Wheelchair Mobility    Modified Rankin (Stroke Patients Only)       Balance Overall balance assessment: Needs assistance Sitting-balance support: No upper extremity supported;Feet supported Sitting balance-Leahy Scale: Good     Standing balance support: No upper extremity supported Standing balance-Leahy Scale: Fair                               Pertinent Vitals/Pain Pain Assessment: Faces Faces Pain Scale: Hurts little more Pain Location: abdomen Pain Descriptors / Indicators: Grimacing;Guarding Pain Intervention(s): Limited activity within patient's tolerance;Monitored during session;Repositioned    Home Living Family/patient expects to be discharged to:: Private residence Living Arrangements: Children Available Help at Discharge: Family;Available PRN/intermittently Type of Home: House Home Access: Stairs to enter Entrance Stairs-Rails: None Entrance Stairs-Number of Steps: 2 Home Layout: One level        Prior Function Level of Independence: Independent         Comments: independent in ADLs/ambulation, drives son to grocery store who does the actual shopping. Limited in function due to pain and some LE weakness prior to surgery. Reported 1 fall that involved pt tripping over something in her home.      Hand Dominance        Extremity/Trunk Assessment   Upper Extremity Assessment Upper Extremity Assessment: Generalized weakness(grossly 4-/5 throughout)    Lower Extremity Assessment Lower Extremity Assessment: Generalized weakness(grossly 4-/5 throughout)       Communication   Communication: No difficulties  Cognition Arousal/Alertness: Awake/alert Behavior During Therapy:  WFL for tasks assessed/performed Overall Cognitive Status: Within Functional Limits for tasks assessed                                         General Comments      Exercises     Assessment/Plan    PT Assessment Patient needs continued PT services  PT Problem List Decreased strength;Decreased range of motion;Decreased activity tolerance;Decreased balance;Decreased mobility;Decreased knowledge of precautions;Decreased safety awareness       PT Treatment Interventions DME instruction;Therapeutic exercise;Gait training;Balance training;Stair training;Neuromuscular re-education;Therapeutic activities;Patient/family education    PT Goals (Current goals can be found in the Care Plan section)  Acute Rehab PT Goals Patient Stated Goal: to return home and build my strength up PT Goal Formulation: With patient Time For Goal Achievement: 04/12/18 Potential to Achieve Goals: Good    Frequency Min 2X/week   Barriers to discharge        Co-evaluation               AM-PAC PT "6 Clicks" Mobility  Outcome Measure Help needed turning from your back to your side while in a flat bed without using bedrails?: None Help needed moving from lying on your back to sitting on the side of a flat bed without using bedrails?: None Help needed moving to and from a bed to a chair (including a wheelchair)?: A Little Help needed standing up from a chair using your arms (e.g., wheelchair or bedside chair)?: A Little Help needed to walk in hospital room?: A Little Help needed climbing 3-5 steps with a railing? : A Little 6 Click Score: 20    End of Session Equipment Utilized During Treatment: Gait belt Activity Tolerance: Patient tolerated treatment well Patient left: in chair;with call bell/phone within reach Nurse Communication: Mobility status PT Visit Diagnosis: Other abnormalities of gait and mobility (R26.89);Muscle weakness (generalized) (M62.81);Difficulty in walking, not elsewhere classified (R26.2)    Time: 2924-4628 PT Time Calculation (min) (ACUTE ONLY): 16 min   Charges:   PT Evaluation $PT Eval Moderate Complexity: 1  Mod         Norm [name], PT, DPT, NCS 03/29/18, 2:21 PM [phone]  Evaluation/treatment session was lead and completed by [name], SPT; directly observed, guided and documented by [name], PT.

## 2018-03-29 NOTE — Progress Notes (Signed)
[place] Hospital Day(s): 1.   Post op day(s):  Marland Kitchen   Interval History: Patient seen and examined, no acute events or new complaints overnight. Patient reports that she is feeling less weak this morning. She still reports the same incisional soreness. No complaints of nausea, emesis, fever, or chills. Still with bowel function.   Review of Systems:  Constitutional: denies fever, chills  Gastrointestinal: denies abdominal pain, N/V, or diarrhea/and bowel function as per interval history Integumentary: denies any other rashes or skin discolorations except surgical incsion  Vital signs in last 24 hours: [min-max] current  Temp:  [94.8 F (34.9 C)-98.6 F (37 C)] 97.4 F (36.3 C) (03/03 2016) Pulse Rate:  [74-98] 80 (03/03 2016) Resp:  [15-18] 18 (03/03 2016) BP: (96-125)/(68-91) 106/85 (03/03 2016) SpO2:  [95 %-100 %] 95 % (03/03 2016) Weight:  [53.4 kg] 53.4 kg (03/03 1731)     Height: 5\' 5"  (165.1 cm) Weight: 53.4 kg BMI (Calculated): 19.6   Intake/Output this shift:  No intake/output data recorded.     Physical Exam:  Constitutional: alert, cooperative and no distress  Respiratory: breathing non-labored at rest Gastrointestinal: soft,non-tender, and non-distended, no rebound/guarding. Gastrojejunostomy tube in LUQ.Marland Kitchen Integumentary: well healingmidline laparotomy incision is CDI. No erythema, no drainage.  Labs:  CBC Latest Ref Rng & Units 03/29/2018 03/28/2018 03/24/2018  WBC 4.0 - 10.5 K/uL 6.9 8.4 10.4  Hemoglobin 12.0 - 15.0 g/dL 10.7(L) 13.3 11.3(L)  Hematocrit 36.0 - 46.0 % 32.6(L) 40.4 34.8(L)  Platelets 150 - 400 K/uL 474(H) 557(H) 613(H)   CMP Latest Ref Rng & Units 03/29/2018 03/28/2018 03/24/2018  Glucose 70 - 99 mg/dL 102(H) 100(H) 106(H)  BUN 8 - 23 mg/dL 7(L) 9 10  Creatinine 0.44 - 1.00 mg/dL 0.50 0.59 0.51  Sodium 135 - 145 mmol/L 137 136 139  Potassium 3.5 - 5.1 mmol/L 5.1 4.1 4.1  Chloride 98 - 111 mmol/L 103 103 102   CO2 22 - 32 mmol/L 28 27 26   Calcium 8.9 - 10.3 mg/dL 8.5(L) 8.7(L) 9.2  Total Protein 6.5 - 8.1 g/dL - 6.5 6.6  Total Bilirubin 0.3 - 1.2 mg/dL - 0.8 0.9  Alkaline Phos 38 - 126 U/L - 110 109  AST 15 - 41 U/L - 17 17  ALT 0 - 44 U/L - 15 21     Imaging studies: No new pertinent imaging studies   Assessment/Plan:  68 y.o. female readmitted with poor nutritional and hydration status s/p open hital hernia repair and GE junction perforation repair with nissan fundoplication and placement of gastrojejnostomy tube.   - Full liquid diet with nutritional support/supplementation (appreciate dietitian involvement)   - Patient needs extensive education and teaching regarding use of g-tube at home, poor social situation limits her help  - Continue pain control as needed   - Medical management of comorbidities  - Will engage PT   - DVT prophylaxis   All of the above findings and recommendations were discussed with the patient, and the medical team, and all of patient's questions were answered to her expressed satisfaction.  -- Edison Simon, PA-C [facility] 03/29/2018, 9:43 AM [phone] M-F: 7am - 4pm

## 2018-03-29 NOTE — Care Management Note (Signed)
Case Management Note  Patient Details  Name: Chelsey [name] MRN: [id number] Date of Birth: [birth year]   Patient states that she lives at home with her son.   PCP [facility].  Pharmacy [facility].  Patient denies issues obtaining medications or with transportation.  Patient is open with [facility] home [facility] for Rn and PT.  Cassie with [facility] notified of admission   Subjective/Objective:                    Action/Plan:   Expected Discharge Date:                  Expected Discharge Plan:     In-House Referral:     Discharge planning Services     Post Acute Care Choice:    Choice offered to:     DME Arranged:    DME Agency:     HH Arranged:    HH Agency:     Status of Service:     If discussed at [employer] of Stay Meetings, dates discussed:    Additional Comments:  Beverly Sessions, RN 03/29/2018, 2:34 PM

## 2018-03-30 MED ORDER — OSMOLITE 1.5 CAL PO LIQD
237.0000 mL | Freq: Four times a day (QID) | ORAL | Status: DC
  Administered 2018-03-30 – 2018-04-04 (×17): 237 mL

## 2018-03-30 MED ORDER — SODIUM CHLORIDE 0.9 % IV BOLUS
1000.0000 mL | Freq: Once | INTRAVENOUS | Status: AC
  Administered 2018-03-30: 1000 mL via INTRAVENOUS

## 2018-03-30 MED ORDER — DOCUSATE SODIUM 100 MG PO CAPS
100.0000 mg | ORAL_CAPSULE | Freq: Every day | ORAL | Status: DC | PRN
  Administered 2018-03-30 – 2018-03-31 (×2): 100 mg via ORAL
  Filled 2018-03-30 (×4): qty 1

## 2018-03-30 MED ORDER — DIPHENHYDRAMINE HCL 25 MG PO CAPS
25.0000 mg | ORAL_CAPSULE | Freq: Four times a day (QID) | ORAL | Status: DC | PRN
  Administered 2018-03-30: 25 mg via ORAL
  Filled 2018-03-30: qty 1

## 2018-03-30 MED ORDER — FREE WATER
50.0000 mL | Freq: Four times a day (QID) | Status: DC
  Administered 2018-03-30 – 2018-04-04 (×17): 50 mL

## 2018-03-30 NOTE — Progress Notes (Addendum)
[place] Hospital Day(s): 2.   Post op day(s):  Marland Kitchen   Interval History: Patient seen and examined, no acute events or new complaints overnight. Patient reports that she feels "the same as always." She notes incisional soreness, but she denied any nausea, emesis, fever, or chills. She reports that she tried to use her G-tube yesterday and noticed drainage coming from the G-tube site and felt that her sutures were coming off. She is mobilizing well but feels like she is constipated.   Review of Systems:  Constitutional: denies fever, chills  Gastrointestinal: + abdominal pain (incisional), denied N/V, or diarrhea/and bowel function as per interval history Integumentary: denies any other rashes or skin discolorations except surgical incision  Vital signs in last 24 hours: [min-max] current  Temp:  [97.9 F (36.6 C)-98.5 F (36.9 C)] 98.4 F (36.9 C) (03/05 0534) Pulse Rate:  [72-75] 75 (03/05 0534) Resp:  [18-20] 18 (03/05 0534) BP: (91-115)/(66-77) 103/66 (03/05 0534) SpO2:  [97 %-100 %] 97 % (03/05 0534)     Height: 5\' 5"  (165.1 cm) Weight: 53.4 kg BMI (Calculated): 19.6   Intake/Output this shift:  No intake/output data recorded.     Physical Exam:  Constitutional: alert, cooperative and no distress  Respiratory: breathing non-labored at rest Gastrointestinal: soft,non-tender, and non-distended, no rebound/guarding. Gastrojejunostomy tube in LUQ.Marland Kitchen Integumentary: well healingmidline laparotomy incision is CDI. No erythema, no drainage.  Labs:  CBC Latest Ref Rng & Units 03/29/2018 03/28/2018 03/24/2018  WBC 4.0 - 10.5 K/uL 6.9 8.4 10.4  Hemoglobin 12.0 - 15.0 g/dL 10.7(L) 13.3 11.3(L)  Hematocrit 36.0 - 46.0 % 32.6(L) 40.4 34.8(L)  Platelets 150 - 400 K/uL 474(H) 557(H) 613(H)   CMP Latest Ref Rng & Units 03/29/2018 03/28/2018 03/24/2018  Glucose 70 - 99 mg/dL 102(H) 100(H) 106(H)  BUN 8 - 23 mg/dL 7(L) 9 10  Creatinine 0.44 - 1.00 mg/dL  0.50 0.59 0.51  Sodium 135 - 145 mmol/L 137 136 139  Potassium 3.5 - 5.1 mmol/L 5.1 4.1 4.1  Chloride 98 - 111 mmol/L 103 103 102  CO2 22 - 32 mmol/L 28 27 26   Calcium 8.9 - 10.3 mg/dL 8.5(L) 8.7(L) 9.2  Total Protein 6.5 - 8.1 g/dL - 6.5 6.6  Total Bilirubin 0.3 - 1.2 mg/dL - 0.8 0.9  Alkaline Phos 38 - 126 U/L - 110 109  AST 15 - 41 U/L - 17 17  ALT 0 - 44 U/L - 15 21     Imaging studies: No new pertinent imaging studies   Assessment/Plan:  68 y.o. female readmitted with failure to thrive and poor nutritional and hydration status s/p open hital hernia repair and GE junction perforation repair with nissan fundoplication and placement of gastrojejnostomy tube.   - Soft diet (Nissen Guidelines) + nutritional support/supplementation (appreciate dietitian involvement)    - Will reassess G-tube this afternoon for functional status, she will likely still require extensive education regarding use of this.   - Add colace for constipation  - Continue pain control as needed              - Medical management of comorbidities   - DVT Prophylaxis   - Discharge Planning: she may benefit from SNF given failure to thrive on her own as an outpatient   All of the above findings and recommendations were discussed with the patient, and the medical team, and all of patient's questions were answered to her expressed satisfaction. -- Edison Simon, PA-C [facility]  03/30/2018, 8:06 AM [phone] M-F: 7am - 4pm

## 2018-03-30 NOTE — Progress Notes (Signed)
Called Dr. Rosana Hoes regarding G tube being clogged so patient was unable to get bolus feeding or free water.  Patient was able to take medications by mouth without difficulty.  Doctor will assess situation in the morning.  Christene Slates 03/30/2018  1:56 AM

## 2018-03-31 ENCOUNTER — Inpatient Hospital Stay: Payer: Medicare Other

## 2018-03-31 ENCOUNTER — Encounter: Payer: Self-pay | Admitting: Radiology

## 2018-03-31 LAB — COMPREHENSIVE METABOLIC PANEL
ALK PHOS: 114 U/L (ref 38–126)
ALT: 15 U/L (ref 0–44)
AST: 19 U/L (ref 15–41)
Albumin: 2.5 g/dL — ABNORMAL LOW (ref 3.5–5.0)
Anion gap: 5 (ref 5–15)
BUN: 11 mg/dL (ref 8–23)
CALCIUM: 8.1 mg/dL — AB (ref 8.9–10.3)
CO2: 30 mmol/L (ref 22–32)
Chloride: 102 mmol/L (ref 98–111)
Creatinine, Ser: 0.49 mg/dL (ref 0.44–1.00)
GFR calc Af Amer: >60 mL/min (ref >60)
GFR calc non Af Amer: >60 mL/min (ref >60)
Glucose, Bld: 93 mg/dL (ref 70–99)
Potassium: 4.4 mmol/L (ref 3.5–5.1)
Sodium: 137 mmol/L (ref 135–145)
Total Bilirubin: 0.5 mg/dL (ref 0.3–1.2)
Total Protein: 5.7 g/dL — ABNORMAL LOW (ref 6.5–8.1)

## 2018-03-31 LAB — ACTH STIMULATION, 3 TIME POINTS
CORTISOL 60 MIN: 26.1 ug/dL
Cortisol, 30 Min: 20.2 ug/dL
Cortisol, Base: 33.4 ug/dL

## 2018-03-31 LAB — CBC
HCT: 32.9 % — ABNORMAL LOW (ref 36.0–46.0)
Hemoglobin: 10.5 g/dL — ABNORMAL LOW (ref 12.0–15.0)
MCH: 30.5 pg (ref 26.0–34.0)
MCHC: 31.9 g/dL (ref 30.0–36.0)
MCV: 95.6 fL (ref 80.0–100.0)
Platelets: 448 10*3/uL — ABNORMAL HIGH (ref 150–400)
RBC: 3.44 MIL/uL — ABNORMAL LOW (ref 3.87–5.11)
RDW: 12.5 % (ref 11.5–15.5)
WBC: 8.3 10*3/uL (ref 4.0–10.5)
nRBC: 0 % (ref 0.0–0.2)

## 2018-03-31 MED ORDER — ALBUMIN HUMAN 25 % IV SOLN
12.5000 g | Freq: Once | INTRAVENOUS | Status: AC
  Administered 2018-03-31: 12.5 g via INTRAVENOUS
  Filled 2018-03-31: qty 50

## 2018-03-31 MED ORDER — IOHEXOL 300 MG/ML  SOLN
100.0000 mL | Freq: Once | INTRAMUSCULAR | Status: AC | PRN
  Administered 2018-03-31: 100 mL via INTRAVENOUS

## 2018-03-31 MED ORDER — TRAZODONE HCL 50 MG PO TABS
50.0000 mg | ORAL_TABLET | Freq: Every day | ORAL | Status: DC
  Administered 2018-03-31 – 2018-04-03 (×4): 50 mg via ORAL
  Filled 2018-03-31 (×4): qty 1

## 2018-03-31 MED ORDER — IOPAMIDOL (ISOVUE-300) INJECTION 61%
15.0000 mL | INTRAVENOUS | Status: AC
  Administered 2018-03-31: 15 mL via ORAL

## 2018-03-31 MED ORDER — COSYNTROPIN 0.25 MG IJ SOLR
0.2500 mg | Freq: Once | INTRAMUSCULAR | Status: AC
  Administered 2018-03-31: 0.25 mg via INTRAVENOUS
  Filled 2018-03-31 (×2): qty 0.25

## 2018-03-31 NOTE — Progress Notes (Signed)
Patient was upset this evening when care manager talked with her and discussed discharge options.  (patient wants to go to SNF but her insurance will not pay for it.).  She made a comment that she would be better off dead.  I asked her if she planned on hurting herself and she said no.  She asked why she couldn't take her nutrition in orally instead of through the g-tube.  I discussed this all with Dr Dahlia Byes.  We will continue to monitor the patient for depression.  She has to continue to use her g-tube for nutrition because she does not take in adequate amounts of feeding orally and becomes dehydrated

## 2018-03-31 NOTE — Progress Notes (Signed)
Pt seen again She is resting comfortable in NAD. She has some soft BP, given difficulty with her neurological status I am going to repeat her CT chest abd to see if we are able to drain the pleural space or the mediastinal collection.  She is not toxic and no need for emergent intervention at this time I d/w Dr. Genevive Bi the case in detail and he agree w my plan

## 2018-03-31 NOTE — Progress Notes (Addendum)
[place] Hospital Day(s): 3.   Post op day(s):  Marland Kitchen   Interval History: Patient seen and examined, no acute events or new complaints overnight. Patient reports that she did not sleep well. She feels that she is having issues with eating currently. No reports of new or worsening abdominal pain, nausea, or emesis. Should be getting supplemental feeding through G-tube. Mobilizing well.   Review of Systems:  Constitutional: denies fever, chills  Gastrointestinal: + abdominal pain (incisional), denied N/V, or diarrhea/and bowel function as per interval history Integumentary: denies any other rashes or skin discolorations except surgical incision   Vital signs in last 24 hours: [min-max] current  Temp:  [98.2 F (36.8 C)-98.7 F (37.1 C)] 98.2 F (36.8 C) (03/06 0452) Pulse Rate:  [78-89] 78 (03/06 0452) Resp:  [18] 18 (03/06 0452) BP: (77-100)/(49-64) 99/64 (03/06 0452) SpO2:  [97 %-100 %] 97 % (03/06 0452)     Height: 5\' 5"  (165.1 cm) Weight: 53.4 kg BMI (Calculated): 19.6   Intake/Output this shift:  No intake/output data recorded.   Intake/Output last 2 shifts:  @IOLAST2SHIFTS @   Physical Exam:  Constitutional: alert, cooperative and no distress  Respiratory: breathing non-labored at rest Gastrointestinal: soft,non-tender, and non-distended, no rebound/guarding. Gastrojejunostomy tube in LUQ.Marland Kitchen Integumentary:well healingmidline laparotomy incision is CDI. No erythema, no drainage.   Labs:  CBC Latest Ref Rng & Units 03/31/2018 03/29/2018 03/28/2018  WBC 4.0 - 10.5 K/uL 8.3 6.9 8.4  Hemoglobin 12.0 - 15.0 g/dL 10.5(L) 10.7(L) 13.3  Hematocrit 36.0 - 46.0 % 32.9(L) 32.6(L) 40.4  Platelets 150 - 400 K/uL 448(H) 474(H) 557(H)   CMP Latest Ref Rng & Units 03/31/2018 03/29/2018 03/28/2018  Glucose 70 - 99 mg/dL 93 102(H) 100(H)  BUN 8 - 23 mg/dL 11 7(L) 9  Creatinine 0.44 - 1.00 mg/dL 0.49 0.50 0.59  Sodium 135 - 145 mmol/L 137 137 136   Potassium 3.5 - 5.1 mmol/L 4.4 5.1 4.1  Chloride 98 - 111 mmol/L 102 103 103  CO2 22 - 32 mmol/L 30 28 27   Calcium 8.9 - 10.3 mg/dL 8.1(L) 8.5(L) 8.7(L)  Total Protein 6.5 - 8.1 g/dL 5.7(L) - 6.5  Total Bilirubin 0.3 - 1.2 mg/dL 0.5 - 0.8  Alkaline Phos 38 - 126 U/L 114 - 110  AST 15 - 41 U/L 19 - 17  ALT 0 - 44 U/L 15 - 15     Imaging studies: No new pertinent imaging studies   Assessment/Plan: 68 y.o.femalereadmitted with failure to thrive and poor nutritional and hydration statuss/popen hital hernia repair and GE junction perforation repair with nissan fundoplication and placement of gastrojejnostomy tube.   - Stay on full liquids + Ensure + Supplemental Tube feedings   - Will half Trazodone dose given hypotension yesterday  - Continue pain control as needed, monitor abdominal exam, nutritional status - Medical management of comorbidities   - encouraged ambulation             - DVT Prophylaxis   - Discharge planning: Although patient will benefit from SNF, after discussion with CSW it does not appear she will meet criteria. Will continue to monitor.   All of the above findings and recommendations were discussed with the patient, and the medical team, and all of patient's questions were answered to her expressed satisfaction.  -- Edison Simon, PA-C [facility] 03/31/2018, 9:31 AM [phone] M-F: 7am - 4pm

## 2018-03-31 NOTE — Progress Notes (Signed)
Patient sat up on the side of the bed and attempted to do her own g-tube feeding.  She had a hard time pulling up the osmolite and pushing it in the g-tube due to the weakness in her arms and the difficulty with the syringe.  Patient was encouraged and I told her we would continue to help her with this

## 2018-04-01 LAB — MAGNESIUM: Magnesium: 2.3 mg/dL (ref 1.7–2.4)

## 2018-04-01 LAB — PHOSPHORUS: Phosphorus: 4.1 mg/dL (ref 2.5–4.6)

## 2018-04-01 MED ORDER — MAGNESIUM HYDROXIDE 400 MG/5ML PO SUSP
30.0000 mL | Freq: Every day | ORAL | Status: DC | PRN
  Administered 2018-04-01: 30 mL via ORAL
  Filled 2018-04-01 (×3): qty 30

## 2018-04-01 MED ORDER — SORBITOL 70 % SOLN
960.0000 mL | TOPICAL_OIL | Freq: Once | ORAL | Status: AC
  Administered 2018-04-01: 960 mL via RECTAL
  Filled 2018-04-01: qty 473

## 2018-04-01 NOTE — Progress Notes (Signed)
Physical Therapy Treatment Patient Details Name: Chelsey [name] MRN: [id number] DOB: [birth year] Today's Date: 04/01/2018    History of Present Illness admitted as direct admit from surgical office due to abdominal pain, dehydration and difficulty managing tube feeds.  Of note, patient s/p open hital hernia repair and GE junction perforation repair with nissan fundoplication and placement of gastrojejnostomy tube during previous admission (03/09/18).     PT Comments    Pt in room.  Able to transition out of bed and ambulate around unit x 1 without AD but generally unsteady.  She reports general weakness in LE's.  Discussed using SPC or walker for balance and energy conservation but she declined.  During a recent admit she also demonstrated similar gait and was resistant to walker use but agreed reluctantly to one at home.    Upon return to room RN in to give medications.  Pt with difficulty swallowing them and began vomiting up clear liquid which she stated had an acidic taste.  Some pills up too.  RN in room and aware.  She stated his is new for her today.    Also discussed BUE strength L weaker than R.  Pt has had 3 shoulder surgeries the most recent she reports in October was a reverse shoulder.  She has significant weakness throughout her arm and is having difficulty with self tube feedings due to weakness.  Discussed with RN, OT and PT on site and orders were placed for OT to eval to assist with strength and problem solving as appropriate.    I did review some basic UE strengthening exercises for her to start on her own to increase strength.   Follow Up Recommendations  Home health PT     Equipment Recommendations       Recommendations for Other Services       Precautions / Restrictions Precautions Precautions: Fall Precaution Comments:  gastrojejnostomy tube, JP drain, abdominal incision Restrictions Weight Bearing Restrictions: No    Mobility  Bed Mobility Overal bed  mobility: Modified Independent                Transfers Overall transfer level: Modified independent Equipment used: None                Ambulation/Gait Ambulation/Gait assistance: Supervision Gait Distance (Feet): 180 Feet Assistive device: None Gait Pattern/deviations: Step-through pattern;Narrow base of support;Decreased step length - right;Decreased step length - left Gait velocity: decreased       Stairs             Wheelchair Mobility    Modified Rankin (Stroke Patients Only)       Balance Overall balance assessment: Modified Independent;Mild deficits observed, not formally tested   Sitting balance-Leahy Scale: Good       Standing balance-Leahy Scale: Fair                              Cognition Arousal/Alertness: Awake/alert Behavior During Therapy: WFL for tasks assessed/performed Overall Cognitive Status: Within Functional Limits for tasks assessed                                        Exercises      General Comments        Pertinent Vitals/Pain Pain Assessment: Faces Faces Pain Scale: Hurts a little bit Pain Location: abdomen Pain Descriptors /  Indicators: Grimacing;Guarding Pain Intervention(s): Limited activity within patient's tolerance;Monitored during session    Home Living                      Prior Function            PT Goals (current goals can now be found in the care plan section) Progress towards PT goals: Progressing toward goals    Frequency    Min 2X/week      PT Plan Current plan remains appropriate    Co-evaluation              AM-PAC PT "6 Clicks" Mobility   Outcome Measure  Help needed turning from your back to your side while in a flat bed without using bedrails?: None Help needed moving from lying on your back to sitting on the side of a flat bed without using bedrails?: None Help needed moving to and from a bed to a chair (including a  wheelchair)?: None Help needed standing up from a chair using your arms (e.g., wheelchair or bedside chair)?: None Help needed to walk in hospital room?: None Help needed climbing 3-5 steps with a railing? : A Little 6 Click Score: 23    End of Session Equipment Utilized During Treatment: Gait belt Activity Tolerance: Patient tolerated treatment well;Patient limited by fatigue Patient left: in bed Nurse Communication: Other (comment)       Time: 7858-8502 PT Time Calculation (min) (ACUTE ONLY): 24 min  Charges:  $Gait Training: 8-22 mins $Therapeutic Exercise: 8-22 mins                     Chesley Noon, PTA 04/01/18, 1:04 PM

## 2018-04-01 NOTE — Progress Notes (Addendum)
Subjective/Chief Complaint: This is a 68 year old female patient of Dr. Corlis Leak, seen on rounds today.  She is status post paraesophageal hernia repair with Nissen fundoplication and gastrojejunostomy tube placement.  She was readmitted for failure to thrive and dehydration.  She continues to have difficulty managing her tube feedings: She remains on a liquid diet.  This morning she is complaining of constipation, stating she has not had a bowel movement since last Saturday. Her CT scan did show a fluid collection, but did not appear to be causing any compression of the esophagus.  Baseline cortisol was normal.    Objective: Vital signs in last 24 hours: Temp:  [97.8 F (36.6 C)-99 F (37.2 C)] 97.8 F (36.6 C) (03/07 0421) Pulse Rate:  [68-90] 81 (03/07 0421) Resp:  [16-20] 18 (03/07 0421) BP: (84-113)/(53-63) 113/60 (03/07 0421) SpO2:  [97 %-100 %] 98 % (03/07 0421) Last BM Date: 03/25/18  Intake/Output from previous day: 03/06 0701 - 03/07 0700 In: 1220 [P.O.:1220] Out: 1550 [Urine:1550] Intake/Output this shift: Total I/O In: 120 [P.O.:120] Out: -   General appearance: alert, cooperative, no distress and mildly confused Resp: normal WOB on RA Cardio: regular rate and rhythm GI: soft, non-tender; bowel sounds normal; no masses,  no organomegaly Incision/Wound: midline surgical site healing well without evidence of infection.  G-J tube site dressed, no drainage.  Lab Results:  Recent Labs    03/31/18 0452  WBC 8.3  HGB 10.5*  HCT 32.9*  PLT 448*   BMET Recent Labs    03/31/18 0452  NA 137  K 4.4  CL 102  CO2 30  GLUCOSE 93  BUN 11  CREATININE 0.49  CALCIUM 8.1*   PT/INR No results for input(s): LABPROT, INR in the last 72 hours. ABG No results for input(s): PHART, HCO3 in the last 72 hours.  Invalid input(s): PCO2, PO2  Studies/Results: Ct Chest W Contrast  Result Date: 03/31/2018 CLINICAL DATA:  Status post esophageal perforation.  Hiatal hernia.  EXAM: CT CHEST AND ABDOMEN WITH CONTRAST TECHNIQUE: Multidetector CT imaging of the chest and abdomen was performed following the standard protocol during bolus administration of intravenous contrast. CONTRAST:  142mL OMNIPAQUE IOHEXOL 300 MG/ML  SOLN COMPARISON:  03/24/2018 FINDINGS: CT CHEST FINDINGS Cardiovascular: Normal heart size. No pericardial effusion. Mild aortic atherosclerosis. Mediastinum/Nodes: Normal appearance of the thyroid gland. The trachea appears patent and is midline. Postoperative changes from hiatal hernia repair is identified. There is no residual intrathoracic stomach to suggest hiatal recurrence. Fundoplication wrap is noted at the level of the GE junction. The bilobed fluid collection within the posterior mediastinum extending into the left hemithorax is again noted. This corresponds to the location of previous hernia. Fluid collection measures 10.2 x 7.5 cm transverse and AP, image 43/8 and image 86/10. On the exam from 03/24/2018 this measured 10.6 by 7.3 cm. There is mild edema and fluid is identified within the posterior mediastinum surrounding the distal esophagus. No extravasation of oral contrast material identified within the mediastinum. No pneumo mediastinum identified. Lungs/Pleura: Moderate left pleural effusion appears increased in volume from the previous exam. Overlying compressive type atelectasis is noted within the left lower lobe. Small right pleural effusion is also increased in volume from previous exam. Musculoskeletal: Platelike atelectasis and dependent atelectasis noted within the posterior and medial right lower lobe. No airspace consolidation. Small pulmonary nodule within the left upper lobe measures 4 mm, image 70/4. Unchanged from 08/19/2016, likely benign CT ABDOMEN FINDINGS Hepatobiliary: Small low attenuation structure within right  lobe of liver measures 4 mm and is too small to characterize. Previous cholecystectomy. Chronic increase caliber of the CBD is  noted which measures up to 9 mm. Pancreas: Unremarkable. No pancreatic ductal dilatation or surrounding inflammatory changes. Spleen: Normal in size without focal abnormality. Adrenals/Urinary Tract: The adrenal glands appear normal. There is bilateral hydronephrosis, increased from previous exam. Stomach/Bowel: Gastrostomy tube is identified. The tube appears intraluminal with tip beyond the level of the pylorus at least to the level of the transverse duodenum. Enteric contrast material is identified within non-dilated small bowel loops. There is a moderate stool burden identified within the visualized portions of the colon. Vascular/Lymphatic: Aortic atherosclerosis. No aneurysm. No enlarged abdominal lymph nodes. Other: There is no significant free fluid or fluid collection within the abdomen. Musculoskeletal: Spondylosis identified within the thoracic spine. Scratch mild thoracolumbar scoliosis with multi level degenerative disc disease. IMPRESSION: 1. Status post Nissen fundoplication with placement of gastrojejunostomy tube. 2. Similar size of large fluid collection in the posterior mediastinum extending into the left hemithorax status post hiatal hernia repair. Presumably this is within the previous hiatal hernia cavity. No gas identified within the fluid collection. No pneumomediastinum and no extravasation of ingested oral contrast material identified within the mediastinum. 3. Bilateral pleural effusions are identified left greater than right. These have increased in volume from previous exam. 4. Bilateral hydronephrosis.  Increased from previous exam. Electronically Signed   By: Kerby Moors M.D.   On: 03/31/2018 16:32   Ct Abdomen W Contrast  Result Date: 03/31/2018 CLINICAL DATA:  Status post esophageal perforation.  Hiatal hernia. EXAM: CT CHEST AND ABDOMEN WITH CONTRAST TECHNIQUE: Multidetector CT imaging of the chest and abdomen was performed following the standard protocol during bolus  administration of intravenous contrast. CONTRAST:  177mL OMNIPAQUE IOHEXOL 300 MG/ML  SOLN COMPARISON:  03/24/2018 FINDINGS: CT CHEST FINDINGS Cardiovascular: Normal heart size. No pericardial effusion. Mild aortic atherosclerosis. Mediastinum/Nodes: Normal appearance of the thyroid gland. The trachea appears patent and is midline. Postoperative changes from hiatal hernia repair is identified. There is no residual intrathoracic stomach to suggest hiatal recurrence. Fundoplication wrap is noted at the level of the GE junction. The bilobed fluid collection within the posterior mediastinum extending into the left hemithorax is again noted. This corresponds to the location of previous hernia. Fluid collection measures 10.2 x 7.5 cm transverse and AP, image 43/8 and image 86/10. On the exam from 03/24/2018 this measured 10.6 by 7.3 cm. There is mild edema and fluid is identified within the posterior mediastinum surrounding the distal esophagus. No extravasation of oral contrast material identified within the mediastinum. No pneumo mediastinum identified. Lungs/Pleura: Moderate left pleural effusion appears increased in volume from the previous exam. Overlying compressive type atelectasis is noted within the left lower lobe. Small right pleural effusion is also increased in volume from previous exam. Musculoskeletal: Platelike atelectasis and dependent atelectasis noted within the posterior and medial right lower lobe. No airspace consolidation. Small pulmonary nodule within the left upper lobe measures 4 mm, image 70/4. Unchanged from 08/19/2016, likely benign CT ABDOMEN FINDINGS Hepatobiliary: Small low attenuation structure within right lobe of liver measures 4 mm and is too small to characterize. Previous cholecystectomy. Chronic increase caliber of the CBD is noted which measures up to 9 mm. Pancreas: Unremarkable. No pancreatic ductal dilatation or surrounding inflammatory changes. Spleen: Normal in size without  focal abnormality. Adrenals/Urinary Tract: The adrenal glands appear normal. There is bilateral hydronephrosis, increased from previous exam. Stomach/Bowel: Gastrostomy tube is identified. The  tube appears intraluminal with tip beyond the level of the pylorus at least to the level of the transverse duodenum. Enteric contrast material is identified within non-dilated small bowel loops. There is a moderate stool burden identified within the visualized portions of the colon. Vascular/Lymphatic: Aortic atherosclerosis. No aneurysm. No enlarged abdominal lymph nodes. Other: There is no significant free fluid or fluid collection within the abdomen. Musculoskeletal: Spondylosis identified within the thoracic spine. Scratch mild thoracolumbar scoliosis with multi level degenerative disc disease. IMPRESSION: 1. Status post Nissen fundoplication with placement of gastrojejunostomy tube. 2. Similar size of large fluid collection in the posterior mediastinum extending into the left hemithorax status post hiatal hernia repair. Presumably this is within the previous hiatal hernia cavity. No gas identified within the fluid collection. No pneumomediastinum and no extravasation of ingested oral contrast material identified within the mediastinum. 3. Bilateral pleural effusions are identified left greater than right. These have increased in volume from previous exam. 4. Bilateral hydronephrosis.  Increased from previous exam. Electronically Signed   By: Kerby Moors M.D.   On: 03/31/2018 16:32    Anti-infectives: Anti-infectives (From admission, onward)   None      Assessment/Plan: s/p paraesophageal hernia repair with Nissen and G-J tube, readmitted for failure to thrive and dehydration. Continue teaching for G tube feedings.  Consider drainage of fluid collections, though may not offer any direct benefit to patient. May require SNF placement due to inability to care for self at home. Add MOM to bowel regimen.  LOS:  4 days    Fredirick Maudlin 04/01/2018

## 2018-04-01 NOTE — Evaluation (Signed)
Occupational Therapy Evaluation Patient Details Name: Chelsey [name] MRN: [id number] DOB: 20-Jun-1950 Today's Date: 04/01/2018    History of Present Illness admitted as direct admit from surgical office due to abdominal pain, dehydration and difficulty managing tube feeds.  Of note, patient s/p open hital hernia repair and GE junction perforation repair with nissan fundoplication and placement of gastrojejnostomy tube during previous admission (03/09/18).    Clinical Impression   Pt seen for OT evaluation this date. Prior to recent medical issues related to her hernia, pt was independent (son drives) and pt eager to return to PLOF with less pain and dysfunction.  Currently pt demonstrates impairments in significant abdominal pain, poor activity tolerance, decreased strength, impaired L shoulder ROM (recent shoulder surgery, still in therapy for it prior to hospitalization). Pt now requires increased assist for ADL and functional mobility. Pt instructed in cognitive behavioral pain coping strategies for improved self mgt of significant abdominal pain as well as BUE strengthening ex with theraputty (handout and theraputty provided to pt and left with her in the room). Pt verbalized understanding of all edu/training provided. Pt tearful at times, stating that the pain is "almost unbearable" and expressed desire to not be a burden to her family and "sometimes I just wish I was dead" 2/2 significant pain. Pt denied active suicidal intentions/thoughts. Emotional support and active listening provided. Offered pt to speak with Chaplain - pt politely declined at this time. RN notified of pain and pt's expressed thoughts. Pt would benefit from skilled OT to address noted impairments and functional limitations (see below for any additional details) in order to maximize safety and independence while minimizing falls risk and caregiver burden.  Upon hospital discharge, recommend pt discharge to home with [facility] services.      Follow Up Recommendations  Home health OT    Equipment Recommendations  Other (comment)(reacher)    Recommendations for Other Services       Precautions / Restrictions Precautions Precautions: Fall Precaution Comments:  gastrojejnostomy tube, JP drain, abdominal incision Restrictions Weight Bearing Restrictions: No      Mobility Bed Mobility Overal bed mobility: Modified Independent             General bed mobility comments: no difficulty to perform, no LOB, but significant pain throughout  Transfers Overall transfer level: Modified independent Equipment used: None Transfers: Sit to/from Stand Sit to Stand: Supervision;Min guard              Balance Overall balance assessment: Modified Independent;Mild deficits observed, not formally tested Sitting-balance support: No upper extremity supported;Feet supported Sitting balance-Leahy Scale: Good     Standing balance support: Bilateral upper extremity supported Standing balance-Leahy Scale: Fair                             ADL either performed or assessed with clinical judgement   ADL Overall ADL's : Needs assistance/impaired                                       General ADL Comments: generally supervision level for ADL, significant abdominal pain, act tol, and weakness being primary limiting factors in ability to safely perform ADL tasks     Vision Patient Visual Report: No change from baseline       Perception     Praxis      Pertinent Vitals/Pain Pain  Assessment: 0-10 Pain Score: 10-Worst pain ever Faces Pain Scale: Hurts a little bit Pain Location: abdomen; pt states "it feels like a weight is on my abdomen" Pain Descriptors / Indicators: Grimacing;Guarding;Moaning;Tightness Pain Intervention(s): Limited activity within patient's tolerance;Monitored during session;Repositioned;Relaxation     Hand Dominance Left   Extremity/Trunk Assessment Upper Extremity  Assessment Upper Extremity Assessment: Generalized weakness;LUE deficits/detail LUE Deficits / Details: recent L shoulder surgery, grossly 3/5 shoulder flexion, 3+/5 elbow flex/ext, grip 4/5 LUE Coordination: decreased gross motor   Lower Extremity Assessment Lower Extremity Assessment: Generalized weakness   Cervical / Trunk Assessment Cervical / Trunk Assessment: Normal   Communication Communication Communication: No difficulties   Cognition Arousal/Alertness: Awake/alert Behavior During Therapy: WFL for tasks assessed/performed Overall Cognitive Status: Within Functional Limits for tasks assessed                                     General Comments       Exercises Other Exercises Other Exercises: pt instructed in cognitive behavioral pain coping strategies to maximimize self mgt of pain Other Exercises: pt instructed in theraputty ex for BUE to improve strength for self mgt of PEG tube   Shoulder Instructions      Home Living Family/patient expects to be discharged to:: Private residence Living Arrangements: Children(son lives with her) Available Help at Discharge: Family;Available PRN/intermittently Type of Home: House Home Access: Stairs to enter [employer] of Steps: 2 Entrance Stairs-Rails: None Home Layout: One level     Bathroom Shower/Tub: Occupational psychologist: Standard     Home Equipment: Bedside commode;Cane - single point          Prior Functioning/Environment Level of Independence: Independent        Comments: independent in ADLs/ambulation, drives son to grocery store who does the actual shopping. Limited in function due to pain and some LE weakness prior to surgery. Reported 1 fall that involved pt tripping over something in her home.         OT Problem List: Decreased strength;Decreased range of motion;Decreased activity tolerance;Impaired UE functional use;Pain      OT Treatment/Interventions:  Self-care/ADL training;Therapeutic exercise;Therapeutic activities;DME and/or AE instruction;Patient/family education    OT Goals(Current goals can be found in the care plan section) Acute Rehab OT Goals Patient Stated Goal: to return home and build my strength up OT Goal Formulation: With patient Time For Goal Achievement: 04/15/18 Potential to Achieve Goals: Good ADL Goals Pt/caregiver will Perform Home Exercise Program: Increased strength;Both right and left upper extremity;With theraputty;Independently;With written HEP provided Additional ADL Goal #1: Pt will be modified independent with tub feeding mgt Additional ADL Goal #2: Pt will verbalize plan to implement at least 1 learned cognitive behavioral pain coping strategy into daily routine to maximize self mgt of pain.  OT Frequency: Min 2X/week   Barriers to D/C:            Co-evaluation              AM-PAC OT "6 Clicks" Daily Activity     Outcome Measure Help from another person eating meals?: A Little Help from another person taking care of personal grooming?: A Little Help from another person toileting, which includes using toliet, bedpan, or urinal?: A Little Help from another person bathing (including washing, rinsing, drying)?: A Little Help from another person to put on and taking off regular upper body clothing?: A Little  Help from another person to put on and taking off regular lower body clothing?: A Little 6 Click Score: 18   End of Session Nurse Communication: Other (comment);Patient requests pain meds(10/10 pain, pt reports "sometimes I just wish I was dead - I hate being a burden to my family")  Activity Tolerance: Patient limited by pain Patient left: in bed;with call bell/phone within reach  OT Visit Diagnosis: Other abnormalities of gait and mobility (R26.89);Muscle weakness (generalized) (M62.81);Pain Pain - Right/Left: Left Pain - part of body: Shoulder(and abdomen)                Time:  1610-9604 OT Time Calculation (min): 28 min Charges:  OT General Charges $OT Visit: 1 Visit OT Evaluation $OT Eval Low Complexity: 1 Low OT Treatments $Therapeutic Activity: 8-22 mins $Therapeutic Exercise: 8-22 mins  Jeni Salles, MPH, MS, OTR/L ascom [phone] 04/01/18, 2:28 PM

## 2018-04-01 NOTE — Progress Notes (Signed)
I have started teaching pt. on G-tube feeding. She needs motivation and encouragement. She sounds like she does not have confidence at all. I encourage her to do the feeding by herself with RN's assistance that way she get used to it.

## 2018-04-02 NOTE — Progress Notes (Addendum)
Pt. and sister had teaching done today about tube feeding. This is the second day for pt. and sister's was helping pt during the whole session. I believed the reason why pt. did not want to do it at home because the G-tube has no clamp and no proper cap. When they tried at home,they made a big mess. So that discouraged pt from doing it. So if she could have a better G-tube cap and clamp,  it will be so much better and easier for the pt and sister. Per sister, nobody has taught them on how to do tube feeding, they had no idea on how to do it. So now, sister verbalized that it is a little better  that they had teaching. It is better today than yesterday. Pt and family need to do it here and return a demonstration to RN to make sure they can do it at home. Keep encouraging pt to do it.

## 2018-04-02 NOTE — Progress Notes (Signed)
Patient refused to participate on th G-tube feeding last night. Patient states that she feels as if she may be a burden to her family if she goes home. Patient complained of pain in the abdominal area in which  morphine was given. Patient slept the remainder of the night.

## 2018-04-02 NOTE — Progress Notes (Signed)
Subjective/Chief Complaint: This is a 68 year old female patient of Dr. Corlis Leak, seen on rounds today.  She is status post paraesophageal hernia repair with Nissen fundoplication and gastrojejunostomy tube placement.  She was readmitted for failure to thrive and dehydration.  She continues to have difficulty managing her tube feedings: She remains on a liquid diet.  Yesterday's enema was effective and she reports relief from her constipation.    Objective: Vital signs in last 24 hours: Temp:  [97.7 F (36.5 C)-98.8 F (37.1 C)] 98.4 F (36.9 C) (03/08 0416) Pulse Rate:  [79-96] 83 (03/08 0416) Resp:  [20] 20 (03/08 0416) BP: (88-100)/(56-62) 96/58 (03/08 0416) SpO2:  [98 %-100 %] 100 % (03/08 0416) Last BM Date: 03/25/18  Intake/Output from previous day: 03/07 0701 - 03/08 0700 In: 934 [P.O.:360; NG/GT:574] Out: 800 [Urine:800] Intake/Output this shift: Total I/O In: 360 [P.O.:360] Out: -   General appearance: alert, cooperative, no distress and mildly confused Resp: normal WOB on RA Cardio: regular rate and rhythm GI: soft, non-tender; bowel sounds normal; no masses,  no organomegaly Incision/Wound: midline surgical site healing well without evidence of infection.  G-J tube site dressed, no drainage.  Lab Results:  Recent Labs    03/31/18 0452  WBC 8.3  HGB 10.5*  HCT 32.9*  PLT 448*   BMET Recent Labs    03/31/18 0452  NA 137  K 4.4  CL 102  CO2 30  GLUCOSE 93  BUN 11  CREATININE 0.49  CALCIUM 8.1*   PT/INR No results for input(s): LABPROT, INR in the last 72 hours. ABG No results for input(s): PHART, HCO3 in the last 72 hours.  Invalid input(s): PCO2, PO2  Studies/Results: Ct Chest W Contrast  Result Date: 03/31/2018 CLINICAL DATA:  Status post esophageal perforation.  Hiatal hernia. EXAM: CT CHEST AND ABDOMEN WITH CONTRAST TECHNIQUE: Multidetector CT imaging of the chest and abdomen was performed following the standard protocol during bolus  administration of intravenous contrast. CONTRAST:  172mL OMNIPAQUE IOHEXOL 300 MG/ML  SOLN COMPARISON:  03/24/2018 FINDINGS: CT CHEST FINDINGS Cardiovascular: Normal heart size. No pericardial effusion. Mild aortic atherosclerosis. Mediastinum/Nodes: Normal appearance of the thyroid gland. The trachea appears patent and is midline. Postoperative changes from hiatal hernia repair is identified. There is no residual intrathoracic stomach to suggest hiatal recurrence. Fundoplication wrap is noted at the level of the GE junction. The bilobed fluid collection within the posterior mediastinum extending into the left hemithorax is again noted. This corresponds to the location of previous hernia. Fluid collection measures 10.2 x 7.5 cm transverse and AP, image 43/8 and image 86/10. On the exam from 03/24/2018 this measured 10.6 by 7.3 cm. There is mild edema and fluid is identified within the posterior mediastinum surrounding the distal esophagus. No extravasation of oral contrast material identified within the mediastinum. No pneumo mediastinum identified. Lungs/Pleura: Moderate left pleural effusion appears increased in volume from the previous exam. Overlying compressive type atelectasis is noted within the left lower lobe. Small right pleural effusion is also increased in volume from previous exam. Musculoskeletal: Platelike atelectasis and dependent atelectasis noted within the posterior and medial right lower lobe. No airspace consolidation. Small pulmonary nodule within the left upper lobe measures 4 mm, image 70/4. Unchanged from 08/19/2016, likely benign CT ABDOMEN FINDINGS Hepatobiliary: Small low attenuation structure within right lobe of liver measures 4 mm and is too small to characterize. Previous cholecystectomy. Chronic increase caliber of the CBD is noted which measures up to 9 mm. Pancreas: Unremarkable. No  pancreatic ductal dilatation or surrounding inflammatory changes. Spleen: Normal in size without  focal abnormality. Adrenals/Urinary Tract: The adrenal glands appear normal. There is bilateral hydronephrosis, increased from previous exam. Stomach/Bowel: Gastrostomy tube is identified. The tube appears intraluminal with tip beyond the level of the pylorus at least to the level of the transverse duodenum. Enteric contrast material is identified within non-dilated small bowel loops. There is a moderate stool burden identified within the visualized portions of the colon. Vascular/Lymphatic: Aortic atherosclerosis. No aneurysm. No enlarged abdominal lymph nodes. Other: There is no significant free fluid or fluid collection within the abdomen. Musculoskeletal: Spondylosis identified within the thoracic spine. Scratch mild thoracolumbar scoliosis with multi level degenerative disc disease. IMPRESSION: 1. Status post Nissen fundoplication with placement of gastrojejunostomy tube. 2. Similar size of large fluid collection in the posterior mediastinum extending into the left hemithorax status post hiatal hernia repair. Presumably this is within the previous hiatal hernia cavity. No gas identified within the fluid collection. No pneumomediastinum and no extravasation of ingested oral contrast material identified within the mediastinum. 3. Bilateral pleural effusions are identified left greater than right. These have increased in volume from previous exam. 4. Bilateral hydronephrosis.  Increased from previous exam. Electronically Signed   By: Kerby Moors M.D.   On: 03/31/2018 16:32   Ct Abdomen W Contrast  Result Date: 03/31/2018 CLINICAL DATA:  Status post esophageal perforation.  Hiatal hernia. EXAM: CT CHEST AND ABDOMEN WITH CONTRAST TECHNIQUE: Multidetector CT imaging of the chest and abdomen was performed following the standard protocol during bolus administration of intravenous contrast. CONTRAST:  150mL OMNIPAQUE IOHEXOL 300 MG/ML  SOLN COMPARISON:  03/24/2018 FINDINGS: CT CHEST FINDINGS Cardiovascular: Normal  heart size. No pericardial effusion. Mild aortic atherosclerosis. Mediastinum/Nodes: Normal appearance of the thyroid gland. The trachea appears patent and is midline. Postoperative changes from hiatal hernia repair is identified. There is no residual intrathoracic stomach to suggest hiatal recurrence. Fundoplication wrap is noted at the level of the GE junction. The bilobed fluid collection within the posterior mediastinum extending into the left hemithorax is again noted. This corresponds to the location of previous hernia. Fluid collection measures 10.2 x 7.5 cm transverse and AP, image 43/8 and image 86/10. On the exam from 03/24/2018 this measured 10.6 by 7.3 cm. There is mild edema and fluid is identified within the posterior mediastinum surrounding the distal esophagus. No extravasation of oral contrast material identified within the mediastinum. No pneumo mediastinum identified. Lungs/Pleura: Moderate left pleural effusion appears increased in volume from the previous exam. Overlying compressive type atelectasis is noted within the left lower lobe. Small right pleural effusion is also increased in volume from previous exam. Musculoskeletal: Platelike atelectasis and dependent atelectasis noted within the posterior and medial right lower lobe. No airspace consolidation. Small pulmonary nodule within the left upper lobe measures 4 mm, image 70/4. Unchanged from 08/19/2016, likely benign CT ABDOMEN FINDINGS Hepatobiliary: Small low attenuation structure within right lobe of liver measures 4 mm and is too small to characterize. Previous cholecystectomy. Chronic increase caliber of the CBD is noted which measures up to 9 mm. Pancreas: Unremarkable. No pancreatic ductal dilatation or surrounding inflammatory changes. Spleen: Normal in size without focal abnormality. Adrenals/Urinary Tract: The adrenal glands appear normal. There is bilateral hydronephrosis, increased from previous exam. Stomach/Bowel: Gastrostomy  tube is identified. The tube appears intraluminal with tip beyond the level of the pylorus at least to the level of the transverse duodenum. Enteric contrast material is identified within non-dilated small bowel loops. There  is a moderate stool burden identified within the visualized portions of the colon. Vascular/Lymphatic: Aortic atherosclerosis. No aneurysm. No enlarged abdominal lymph nodes. Other: There is no significant free fluid or fluid collection within the abdomen. Musculoskeletal: Spondylosis identified within the thoracic spine. Scratch mild thoracolumbar scoliosis with multi level degenerative disc disease. IMPRESSION: 1. Status post Nissen fundoplication with placement of gastrojejunostomy tube. 2. Similar size of large fluid collection in the posterior mediastinum extending into the left hemithorax status post hiatal hernia repair. Presumably this is within the previous hiatal hernia cavity. No gas identified within the fluid collection. No pneumomediastinum and no extravasation of ingested oral contrast material identified within the mediastinum. 3. Bilateral pleural effusions are identified left greater than right. These have increased in volume from previous exam. 4. Bilateral hydronephrosis.  Increased from previous exam. Electronically Signed   By: Kerby Moors M.D.   On: 03/31/2018 16:32    Anti-infectives: Anti-infectives (From admission, onward)   None      Assessment/Plan: s/p paraesophageal hernia repair with Nissen and G-J tube, readmitted for failure to thrive and dehydration. Continue teaching for G tube feedings.  Consider drainage of fluid collections, though may not offer any direct benefit to patient. May require SNF placement due to inability to care for self at home.  LOS: 5 days    Fredirick Maudlin 04/02/2018

## 2018-04-03 ENCOUNTER — Inpatient Hospital Stay: Payer: Medicare Other

## 2018-04-03 DIAGNOSIS — F331 Major depressive disorder, recurrent, moderate: Secondary | ICD-10-CM

## 2018-04-03 DIAGNOSIS — F5101 Primary insomnia: Secondary | ICD-10-CM

## 2018-04-03 DIAGNOSIS — Z8719 Personal history of other diseases of the digestive system: Secondary | ICD-10-CM

## 2018-04-03 DIAGNOSIS — F410 Panic disorder [episodic paroxysmal anxiety] without agoraphobia: Secondary | ICD-10-CM

## 2018-04-03 DIAGNOSIS — E860 Dehydration: Principal | ICD-10-CM

## 2018-04-03 DIAGNOSIS — Z9889 Other specified postprocedural states: Secondary | ICD-10-CM

## 2018-04-03 LAB — PATHOLOGIST SMEAR REVIEW

## 2018-04-03 MED ORDER — PAROXETINE HCL 20 MG PO TABS
40.0000 mg | ORAL_TABLET | Freq: Every day | ORAL | Status: DC
  Administered 2018-04-03 – 2018-04-04 (×2): 40 mg via ORAL
  Filled 2018-04-03 (×2): qty 2

## 2018-04-03 MED ORDER — ENOXAPARIN SODIUM 40 MG/0.4ML ~~LOC~~ SOLN
40.0000 mg | SUBCUTANEOUS | Status: DC
  Administered 2018-04-03: 40 mg via SUBCUTANEOUS
  Filled 2018-04-03: qty 0.4

## 2018-04-03 MED ORDER — PAROXETINE HCL 20 MG PO TABS
20.0000 mg | ORAL_TABLET | Freq: Every day | ORAL | Status: DC
  Filled 2018-04-03: qty 1

## 2018-04-03 MED ORDER — PAROXETINE HCL 20 MG PO TABS: 40.0000 mg | ORAL_TABLET | Freq: Every day | ORAL | Status: DC

## 2018-04-03 NOTE — Procedures (Signed)
  Procedure: US L thoracentesis   EBL:   minimal Complications:  none immediate  See full dictation in [facility] PACS.  D. Jereld Presti MD Main # [phone] Pager  [phone]    

## 2018-04-03 NOTE — Progress Notes (Signed)
OT Cancellation Note  Patient Details Name: Chelsey [name] MRN: [id number] DOB: [birth year]   Cancelled Treatment:    Reason Eval/Treat Not Completed: Patient declined, no reason specified;Fatigue/lethargy limiting ability to participate;Pain limiting ability to participate. Upon attempt this am, pt reporting significant pain and fatigue (from pain overnight) and requests OT come back at later time. Will re-attempt at later date/time as pt is able and medically appropriate to participate.   Jeni Salles, MPH, MS, OTR/L ascom [phone] 04/03/18, 12:44 PM

## 2018-04-03 NOTE — Progress Notes (Signed)
Nutrition Follow Up Note   DOCUMENTATION CODES:   Severe malnutrition in context of chronic illness  INTERVENTION:   Osmolite 1.5- Please provide 4 cans daily via G-portion of pt's tube- Flush with 38ml of water before and after each tube feed  Regimen provides 1420kcal/day (meets 95% of pt's estimated needs), 60g/day protein (meets 75% of estimated needs) and 1145ml/day free water (meets 86% of estimated needs)   Provide liquid MVI daily via tube   Provide vitamin C 250mg  BID via tube   NUTRITION DIAGNOSIS:   Severe Malnutrition related to chronic illness(s/p nissen procedure for hiatal hernia ) as evidenced by 28 percent weight loss in 1 year, moderate to severe fat depletions, moderate to severe muscle depletions.  GOAL:   Patient will meet greater than or equal to 90% of their needs  -progressing with tube feeds and full liquid diet   MONITOR:   PO intake, Supplement acceptance, Labs, Weight trends, TF tolerance, Skin, I & O's  ASSESSMENT:   68 y.o. female readmitted with poor nutritional and hydration status s/p open hital hernia repair and GE junction perforation repair with nissan fundoplication and placement of gastrojejnostomy tube.   Pt tolerating tube feeds well. RN has been working with pt and allowing her to do the tube feeds herself so that patient will become more comfortable with this process and hopefully will be able to do this herself at home. Pt does report that she feels more comfortable with feeds today but she still has periods of anxiety and crying during tube feeds. Pt has intermittent complaints about tube feeds including early satiety, abdominal and shoulder pain. Pt is eating sips and bites of meals and is drinking some Ensure. No new weight since admit; will request new weight. Per MD note, pt to possibly discharge tomorrow. RD is concerned that pt may be non- compliant with tube feed regimen at home as she feels she can eat enough to meet her needs.  Recommend weekly follow-ups for weight checks to monitor that pt is not continuing to loose weight.   Medications reviewed and include: protonix, paxil, vitamin C  Labs reviewed:  P 4.1 wnl, Mg 2.3 wnl- 3/7  Diet Order:   Diet Order            Diet NPO time specified  Diet effective now             EDUCATION NEEDS:   Education needs have been addressed  Skin:  Skin Assessment: Reviewed RN Assessment(Stage I sacrum, incision abdomen )  Last BM:  3/7- type 1  Height:   Ht Readings from Last 1 Encounters:  03/28/18 5\' 5"  (1.651 m)    Weight:   Wt Readings from Last 1 Encounters:  03/28/18 53.4 kg    Ideal Body Weight:  56.8 kg  BMI:  Body mass index is 19.6 kg/m.  Estimated Nutritional Needs:   Kcal:  1500-1700kcal/day   Protein:  80-85g/day   Fluid:  1.3L/day   Koleen Distance MS, RD, LDN Pager #- [phone] Office#- [phone] After Hours Pager: [phone]

## 2018-04-03 NOTE — Progress Notes (Signed)
I assisted the patient while she was doing her osmelite feeding through her g-tube.  She was tearful and times and continued to say it hurt her shoulder.  We had more success with the catheter tip because she was having a difficult time with the irrigation syringe.

## 2018-04-03 NOTE — Consult Note (Signed)
[place] for Post IR Procedure Consult - Anticoagulant/Antiplatelet PTA/Inpatient Med List Review by Pharmacist Indication: US THORACENTESIS ASP PLEURAL SPACE W/IMG GUIDE  Patient Measurements: Height: 5\' 5"  (165.1 cm) Weight: 117 lb 12.7 oz (53.4 kg) IBW/kg (Calculated) : 57  Vital Signs: Temp: 99.3 F (37.4 C) (03/09 1247) Temp Source: Oral (03/09 1247) BP: 80/51 (03/09 1414) Pulse Rate: 89 (03/09 1414)  Labs: No results for input(s): HGB, HCT, PLT, APTT, LABPROT, INR, HEPARINUNFRC, HEPRLOWMOCWT, CREATININE, CKTOTAL, CKMB, TROPONINI in the last 72 hours.  Estimated Creatinine Clearance: 58.3 mL/min (by C-G formula based on SCr of 0.49 mg/dL).   Medical History: Past Medical History:  Diagnosis Date  . Breast cancer ([place]) 1997   right breast, radiation  . Bronchitis    recent/ 06/09/15 had chest xray/ [facility]/resolved  . Cancer [facility]) 1997   right lumpectomy,L/Ad/R   . GERD (gastroesophageal reflux disease)   . History of hiatal hernia   . Hypercholesteremia   . Hyperlipidemia   . Low BP    TYPICALLY RUNS 80'S/60'S  . Panic attack   . Personal history of malignant neoplasm of breast     Medications:  Scheduled:  . busPIRone  10 mg Oral BID  . feeding supplement (ENSURE ENLIVE)  237 mL Oral BID BM  . feeding supplement (OSMOLITE 1.5 CAL)  237 mL Per Tube QID  . free water  50 mL Per Tube QID  . gabapentin  300 mg Oral TID  . multivitamin  15 mL Per Tube Daily  . pantoprazole  40 mg Oral Daily  . PARoxetine  20 mg Oral QAC supper  . simvastatin  20 mg Oral QHS  . traZODone  50 mg Oral QHS  . vitamin C  250 mg Per Tube BID    Assessment: S/P US THORACENTESIS ASP PLEURAL SPACE W/IMG GUIDE. She was on LMWH prophylactic dose with next dose scheduled at 2200.  Goal of Therapy:  Monitor platelets by anticoagulation protocol: Yes   Plan:  Continue LMWH at current next dose with no interruption in therapy.  Dallie Piles, PharmD 04/03/2018,2:52 PM

## 2018-04-03 NOTE — Consult Note (Signed)
[place] Psychiatry Consult   Reason for Consult:  Depression and anxiety affecting long term care goals Referring Physician:  Mr. Standley Brooking, [place] Patient Identification: Chelsey [name] MRN:  [id number] Principal Diagnosis: S/P repair of paraesophageal hernia Diagnosis:  Principal Problem:   S/P repair of paraesophageal hernia Active Problems:   Depression, major, recurrent, moderate ([place])   Panic attack   Pressure injury of skin   Insomnia   Protein-calorie malnutrition, severe   Dehydration  Patient seen, chart is reviewed. Total Time spent with patient: 1 hour  Subjective: "I feel like I rather die."  Psychiatry consult is requested to evaluate for worsening depression and anxiety.  HPI: Chelsey [name] is a 68 y.o. female patient admitted with dehydration, lethargy, and acute on chronic severe protein and caloric malnutrition19Days s/p attempted robotic-assisted type 3 paraesophageal hernia repair, converted to open repair with repair of intra-operative gastro-esophageal junction perforation, Nissen fundoplication, and placement of gastrojejunostomy tube for nutritional support(Pabon,03/09/2018).  On evaluation, patient is tearful and stating that she has pain.  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  Patient reports she had sustained an injury with a fall and hurt her shoulder and has had 3 surgeries since that time.  Patient most recently presented for abdominal surgery which had complications.  She has been having pain at her surgical insertion site of her G-J tube.  Patient reports that for the last 3-1/2 weeks she has been feeling like she wants to die.  She has been considering ways to complete a suicide, however notes that because she cannot swallow pills she is unable to kill herself.  Patient describes ongoing depressed mood with lack of interest in any activities.  She isolates  herself.  She has a poor appetite, but has been forcing herself to eat, "because I know I need to."  She states that she has been sleeping okay while at home, however while in the hospital has not been sleeping well due to the noisiness.  Patient would like to get back to her usual activities which include it working, socializing with work friends, and attending her grandchildren's sporting events.  However, at this time she feels helpless and hopeless that she will ever get back to that state.  She is interested in having activities to occupy her time.  Patient denies any homicidal ideation.  She denies any auditory and visual hallucinations.  She denies any past history of mania. She reports that medications in the past for her depression and anxiety (Paxil and BuSpar) had been beneficial.  Past Psychiatric History: Depression and anxiety treated by Dr. Gretel Acre  Risk to Self:  Patient endorses suicidal thoughts, but has no plan or intent for suicide at this time. Risk to Others:  Denies Prior Inpatient Therapy:  Yes, patient reports inpatient psychiatric admission in 2018 after her shoulder injury and loss of job. Prior Outpatient Therapy:  Patient follows with Dr. Gretel Acre for visits every 2 to 3 months.  She has been stable.  Patient is not currently in psychotherapy.  Past Medical History:  Past Medical History:  Diagnosis Date  . Breast cancer ([place]) 1997   right breast, radiation  . Bronchitis    recent/ 06/09/15 had chest xray/ [facility]/resolved  . Cancer [facility]) 1997   right lumpectomy,L/Ad/R   . GERD (gastroesophageal reflux disease)   . History of hiatal hernia   .  Hypercholesteremia   . Hyperlipidemia   . Low BP    TYPICALLY RUNS 80'S/60'S  . Panic attack   . Personal history of malignant neoplasm of breast     Past Surgical History:  Procedure Laterality Date  . BICEPT TENODESIS Left 11/23/2016   Procedure: BICEPS TENODESIS;  Surgeon: Leim Fabry, MD;  Location: [facility]  ORS;  Service: Orthopedics;  Laterality: Left;  . BREAST EXCISIONAL BIOPSY Right 1997   pos  . BREAST SURGERY Right 1997   lumpectomy  . CHOLECYSTECTOMY N/A 08/06/2016   Procedure: LAPAROSCOPIC CHOLECYSTECTOMY WITH INTRAOPERATIVE CHOLANGIOGRAM;  Surgeon: Christene Lye, MD;  Location: [facility] ORS;  Service: General;  Laterality: N/A;  . COLONOSCOPY  2008  . COLONOSCOPY WITH PROPOFOL N/A 07/21/2015   Procedure: COLONOSCOPY WITH PROPOFOL;  Surgeon: Lucilla Lame, MD;  Location: [place];  Service: Endoscopy;  Laterality: N/A;  . COLONOSCOPY WITH PROPOFOL N/A 01/05/2018   Procedure: COLONOSCOPY WITH PROPOFOL;  Surgeon: Jonathon Bellows, MD;  Location: [facility] ENDOSCOPY;  Service: Gastroenterology;  Laterality: N/A;  . ESOPHAGOGASTRODUODENOSCOPY (EGD) WITH PROPOFOL N/A 06/16/2016   Procedure: ESOPHAGOGASTRODUODENOSCOPY (EGD) WITH PROPOFOL;  Surgeon: Christene Lye, MD;  Location: [facility] ENDOSCOPY;  Service: Endoscopy;  Laterality: N/A;  . ESOPHAGOGASTRODUODENOSCOPY (EGD) WITH PROPOFOL N/A 01/05/2018   Procedure: ESOPHAGOGASTRODUODENOSCOPY (EGD) WITH PROPOFOL;  Surgeon: Jonathon Bellows, MD;  Location: [facility] ENDOSCOPY;  Service: Gastroenterology;  Laterality: N/A;  . HARDWARE REMOVAL Left 12/27/2016   Procedure: HARDWARE REMOVAL LEFT  PROXIMAL HUMEROUS;  Surgeon: Leim Fabry, MD;  Location: [facility] ORS;  Service: Orthopedics;  Laterality: Left;  . NISSEN FUNDOPLICATION N/A 07/31/2374   Procedure: NISSEN FUNDOPLICATION;  Surgeon: Jules Husbands, MD;  Location: [facility] ORS;  Service: General;  Laterality: N/A;  . ORIF HUMERUS FRACTURE Left 11/23/2016   Procedure: OPEN REDUCTION INTERNAL FIXATION (ORIF) PROXIMAL HUMERUS FRACTURE;  Surgeon: Leim Fabry, MD;  Location: [facility] ORS;  Service: Orthopedics;  Laterality: Left;  . REPAIR OF ESOPHAGUS  03/09/2018   Procedure: REPAIR OF ESOPHAGUS;  Surgeon: Jules Husbands, MD;  Location: [facility] ORS;  Service: General;;  . REVERSE SHOULDER ARTHROPLASTY Left 12/27/2016    Procedure: REVERSE SHOULDER ARTHROPLASTY;  Surgeon: Leim Fabry, MD;  Location: [facility] ORS;  Service: Orthopedics;  Laterality: Left;  . ROBOTIC ASSISTED LAPAROSCOPIC REPAIR OF PARAESOPHAGEAL HERNIA N/A 03/09/2018   Procedure: ROBOTIC HIATAL HERNIA CONVERTED TO OPEN;  Surgeon: Jules Husbands, MD;  Location: [facility] ORS;  Service: General;  Laterality: N/A;   Family History:  Family History  Problem Relation Age of Onset  . Anxiety disorder Brother   . Obesity Brother   . COPD Brother   . Kidney disease Mother   . Heart attack Father   . Hypertension Father   . Alcohol abuse Father   . Depression Brother   . Anxiety disorder Brother   . Breast cancer Maternal Grandmother    Family Psychiatric  History: Brother with depression x 2 and anxiety x1; father with alcoholism  Social History:  Social History   Substance and Sexual Activity  Alcohol Use No  . Alcohol/week: 0.0 standard drinks     Social History   Substance and Sexual Activity  Drug Use No    Social History   Socioeconomic History  . Marital status: Widowed    Spouse name: Not on file  . Number of children: 2  . Years of education: Not on file  . Highest education level: Bachelor's degree (e.g., BA, AB, BS)  Occupational History  . Not on file  Social Needs  . Financial resource strain: Hard  . Food insecurity:    Worry: Never true    Inability: Never true  . Transportation needs:    Medical: No    Non-medical: No  Tobacco Use  . Smoking status: Former Smoker    Packs/day: 0.25    Years: 10.00    Pack years: 2.50    Types: Cigarettes    Start date: 11/25/2009  . Smokeless tobacco: Never Used  . Tobacco comment: 1 cigarette daily  Substance and Sexual Activity  . Alcohol use: No    Alcohol/week: 0.0 standard drinks  . Drug use: No  . Sexual activity: Never  Lifestyle  . Physical activity:    Days per week: 0 days    Minutes per session: 0 min  . Stress: Very much  Relationships  . Social  connections:    Talks on phone: Not on file    Gets together: Not on file    Attends religious service: Never    Active member of club or organization: No    Attends meetings of clubs or organizations: Never    Relationship status: Widowed  Other Topics Concern  . Not on file  Social History Narrative  . Not on file   Additional Social History:  Patient was married for 24 years, and has been widowed for 20 years after her husband succumbed to colon cancer. She has been living with her 67 year old son. She has a 24 year old son who lives in [place].  She has 4 grandchildren.  Allergies:  No Known Allergies  Labs: No results found for this or any previous visit (from the past 48 hour(s)).  Current Facility-Administered Medications  Medication Dose Route Frequency Provider Last Rate Last Dose  . busPIRone (BUSPAR) tablet 10 mg  10 mg Oral BID Tylene Fantasia, PA-C   10 mg at 04/03/18 1026  . cyclobenzaprine (FLEXERIL) tablet 5 mg  5 mg Oral TID PRN Tylene Fantasia, PA-C      . diphenhydrAMINE (BENADRYL) capsule 25 mg  25 mg Oral Q6H PRN Jules Husbands, MD   25 mg at 03/30/18 2150  . docusate sodium (COLACE) capsule 100 mg  100 mg Oral Daily PRN Tylene Fantasia, PA-C   100 mg at 03/31/18 1410  . feeding supplement (ENSURE ENLIVE) (ENSURE ENLIVE) liquid 237 mL  237 mL Oral BID BM Tylene Fantasia, PA-C   237 mL at 04/02/18 1520  . feeding supplement (OSMOLITE 1.5 CAL) liquid 237 mL  237 mL Per Tube QID Pabon, Diego F, MD   237 mL at 04/03/18 1027  . free water 50 mL  50 mL Per Tube QID Caroleen Hamman F, MD   50 mL at 04/03/18 1027  . gabapentin (NEURONTIN) capsule 300 mg  300 mg Oral TID Edison Simon R, PA-C   300 mg at 04/03/18 1026  . HYDROcodone-acetaminophen (HYCET) 7.5-325 mg/15 ml solution 15 mL  15 mL Oral QID PRN Tylene Fantasia, PA-C   15 mL at 04/02/18 1520  . ibuprofen (ADVIL,MOTRIN) tablet 200-400 mg  200-400 mg Oral Daily PRN Tylene Fantasia, PA-C      . magnesium  hydroxide (MILK OF MAGNESIA) suspension 30 mL  30 mL Oral Daily PRN Fredirick Maudlin, MD   30 mL at 04/01/18 1218  . morphine 2 MG/ML injection 2 mg  2 mg Intravenous Q3H PRN Tylene Fantasia, PA-C   2 mg at 04/02/18 2152  . multivitamin liquid 15 mL  15 mL Per Tube Daily Tylene Fantasia, PA-C   15 mL at 04/03/18 1026  . ondansetron (ZOFRAN-ODT) disintegrating tablet 4 mg  4 mg Oral Q6H PRN Tylene Fantasia, PA-C       Or  . ondansetron [facility]) injection 4 mg  4 mg Intravenous Q6H PRN Edison Simon R, PA-C   4 mg at 03/30/18 1247  . pantoprazole (PROTONIX) EC tablet 40 mg  40 mg Oral Daily Tylene Fantasia, PA-C   40 mg at 04/03/18 1026  . PARoxetine (PAXIL) tablet 20 mg  20 mg Oral QAC supper Tylene Fantasia, PA-C   20 mg at 04/02/18 1842  . simethicone (MYLICON) chewable tablet 80 mg  80 mg Oral Daily PRN Tylene Fantasia, PA-C   80 mg at 03/31/18 1634  . simvastatin (ZOCOR) tablet 20 mg  20 mg Oral QHS Tylene Fantasia, PA-C   20 mg at 04/02/18 2152  . traZODone (DESYREL) tablet 50 mg  50 mg Oral QHS Tylene Fantasia, PA-C   50 mg at 04/02/18 2152  . vitamin C (ASCORBIC ACID) tablet 250 mg  250 mg Per Tube BID Tylene Fantasia, PA-C   250 mg at 04/03/18 1027    Musculoskeletal: Strength & Muscle Tone: decreased Gait & Station: unsteady Patient leans: N/A  Psychiatric Specialty Exam: Physical Exam  Nursing note and vitals reviewed. Constitutional: She is oriented to person, place, and time. She appears well-nourished. No distress.  HENT:  Head: Atraumatic.  Eyes: EOM are normal.  Cardiovascular: Normal rate.  Respiratory: Effort normal. No respiratory distress.  Musculoskeletal: Normal range of motion.  Neurological: She is alert and oriented to person, place, and time.  Skin: Skin is warm and dry.    Review of Systems  Constitutional: Positive for weight loss.  HENT: Negative.   Respiratory: Negative.   Cardiovascular: Negative.   Gastrointestinal: Positive for  abdominal pain.  Musculoskeletal: Positive for myalgias.  Neurological: Negative.   Psychiatric/Behavioral: Positive for depression and suicidal ideas (passive). Negative for hallucinations, memory loss and substance abuse. The patient is nervous/anxious. The patient does not have insomnia.     Blood pressure (!) 101/59, pulse 86, temperature 98.4 F (36.9 C), temperature source Oral, resp. rate 20, height 5\' 5"  (1.651 m), weight 53.4 kg, SpO2 98 %.Body mass index is 19.6 kg/m.  General Appearance: Fairly Groomed  Eye Contact:  Good  Speech:  Clear and Coherent and Normal Rate  Volume:  Decreased  Mood:  Anxious and Depressed  Affect:  Congruent  Thought Process:  Linear and Descriptions of Associations: Tangential  Orientation:  Full (Time, Place, and Person)  Thought Content:  Hallucinations: None, Rumination and Tangential  Suicidal Thoughts:  Yes.  without intent/plan  Homicidal Thoughts:  No  Memory:  good  Judgement:  Impaired  Insight:  Present  Psychomotor Activity:  Restlessness  Concentration:  Concentration: Fair  Recall:  [place] of Knowledge:  Good  Language:  Good  Akathisia:  No  Handed:  Right  AIMS (if indicated):     Assets:  Communication Skills Desire for Improvement Financial Resources/Insurance Housing Social Support  ADL's:  Impaired  Cognition:  WNL  Sleep:   decreased while hospitalized    Treatment Plan Summary: Daily contact with patient to assess and evaluate symptoms and progress in treatment and Medication management  Increase Paxil to 40 mg daily for depression and anxiety. Encourage good pain management through open communication with nursing.  Consultation placed for  chaplain.   Disposition: Supportive therapy provided about ongoing stressors. We will continue to follow patient when hospitalized.  Recommend patient be started with psychotherapy after discharge.  I will contact patient's outpatient psychiatrist for appropriate  referral to therapist in their office.  Lavella Hammock, MD 04/03/2018 12:14 PM

## 2018-04-03 NOTE — Progress Notes (Signed)
Patient did much better with her g tube feeding this evening.  She was able to pull up and deliver her own feeding

## 2018-04-03 NOTE — Progress Notes (Signed)
   04/03/18 1800  Clinical Encounter Type  Visited With Patient;Patient and family together  Visit Type Initial  Referral From Physician  Spiritual Encounters  Spiritual Needs Emotional  Stress Factors  Patient Stress Factors Health changes;Loss   The chaplain received an OR for prayer for the patient. Upon arrival, the patient was laying in bed and awake. She was pleasant and welcoming. She presented with the concern that she was quite sad and in a lot of pain. During the visit, she was intermittently tearful as she talked about the pain, recovery, taking good care of herself, and grieving the loss of her late husband. The patient shared her recent relevant medical history, including what necessitated her hospitalization this time. She expressed frustration and feeling quite depressed with the routine nature of her surgery and an outcome that was far from expected. She also expressed that she wishes to be pain free, feels tired beyond what a good night's rest can remedy, and that she doesn't have much to give at this point. The chaplain affirmed her pain, frustration, and desire to be in a better place physically and emotionally--these are normal human needs in the midst of suffering. We discussed hope and what allows her to be hopeful in difficult times. She is open to counseling/therapy and agrees that it could be beneficial to work through/on some of the challenges she is facing. During the conversation the nurse came in to administer pain medication and the patient's sister arrived to visit. The chaplain chaplain concluded the visit to allow the patient and her sister time together after providing two crossword puzzles and a Daily Bread devotional.

## 2018-04-03 NOTE — Progress Notes (Addendum)
[place] Hospital Day(s): 6.   Post op day(s):  Marland Kitchen   Interval History: Patient seen and examined, no acute events or new complaints overnight.   Patient reports a new pain in her lateral chest wall on the left which radiates to her back that started this morning. This is worse with inspiration but she denied any shortness of breath. She does feel like she is not taking any deep breath because of the pain. Otherwise no complaints of fever, chills, nausea, or emesis. She endorses bowel movements since the enema but still has gas pains. Tolerating a full liquid diet. She had extensive g-tube teaching with RN staff over the weekend, and she is feeling "a little better" about using it at home.   Review of Systems:  Constitutional: denies fever, chills  Respiratory: denies any shortness of breath  Cardiovascular: + chest pain (left lateral with inspiration), denied palpitations  Gastrointestinal: + abdominal pain (G-Tube, unchanged), denied N/V, or diarrhea/and bowel function as per interval history   Vital signs in last 24 hours: [min-max] current  Temp:  [97.9 F (36.6 C)-98.4 F (36.9 C)] 98.4 F (36.9 C) (03/09 0459) Pulse Rate:  [85-91] 86 (03/09 0459) Resp:  [17-20] 20 (03/09 0459) BP: (82-113)/(53-69) 101/59 (03/09 0459) SpO2:  [98 %-100 %] 98 % (03/09 0459)     Height: 5\' 5"  (165.1 cm) Weight: 53.4 kg BMI (Calculated): 19.6   Intake/Output this shift:  No intake/output data recorded.    Physical Exam:  Constitutional: alert, cooperative and no distress  Respiratory: breathing non-labored at rest Gastrointestinal: soft,non-tender, and non-distended, no rebound/guarding. Gastrojejunostomy tube in LUQ. Integumentary:well healingmidline laparotomy incision is CDI. No erythema, no drainage.   Labs:  CBC Latest Ref Rng & Units 03/31/2018 03/29/2018 03/28/2018  WBC 4.0 - 10.5 K/uL 8.3 6.9 8.4  Hemoglobin 12.0 - 15.0 g/dL 10.5(L) 10.7(L)  13.3  Hematocrit 36.0 - 46.0 % 32.9(L) 32.6(L) 40.4  Platelets 150 - 400 K/uL 448(H) 474(H) 557(H)   CMP Latest Ref Rng & Units 03/31/2018 03/29/2018 03/28/2018  Glucose 70 - 99 mg/dL 93 102(H) 100(H)  BUN 8 - 23 mg/dL 11 7(L) 9  Creatinine 0.44 - 1.00 mg/dL 0.49 0.50 0.59  Sodium 135 - 145 mmol/L 137 137 136  Potassium 3.5 - 5.1 mmol/L 4.4 5.1 4.1  Chloride 98 - 111 mmol/L 102 103 103  CO2 22 - 32 mmol/L 30 28 27   Calcium 8.9 - 10.3 mg/dL 8.1(L) 8.5(L) 8.7(L)  Total Protein 6.5 - 8.1 g/dL 5.7(L) - 6.5  Total Bilirubin 0.3 - 1.2 mg/dL 0.5 - 0.8  Alkaline Phos 38 - 126 U/L 114 - 110  AST 15 - 41 U/L 19 - 17  ALT 0 - 44 U/L 15 - 15     Imaging studies:   CXR (04/03/2018) personally reviewed and radiologist report reviewed:  IMPRESSION: Left lower thorax atelectasis versus airspace consolidation with left pleural effusion. Please note that the left pleural effusion could not be distinguished from previously described left lower thorax fluid collection.   Assessment/Plan:  68 y.o.femalereadmitted withfailure to thrive andpoor nutritional and hydration statuss/popen hital hernia repair and GE junction perforation repair with nissan fundoplication and placement of gastrojejnostomy tube.   - Stay on full liquids + Ensure + Supplemental Tube feedings   - Continue pain control as needed, monitor abdominal exam, nutritional status   - Consult IR for possible aspiration of left pleural effusion, will start IS for potential atelectasis  - Medical management  of comorbidities  - Will consult psychiatry as patient appears somewhat depressed, tearful, anxious about long term care/outcomes             - encouraged ambulation - DVT Prophylaxis   All of the above findings and recommendations were discussed with the patient, and the medical team, and all of patient's questions were answered to her expressed satisfaction.  -- Edison Simon, PA-C [facility] 04/03/2018, 9:49 AM [phone] M-F: 7am - 4pm

## 2018-04-04 MED ORDER — GABAPENTIN 300 MG PO CAPS: 300.0000 mg | ORAL_CAPSULE | Freq: Three times a day (TID) | ORAL | 0 refills | Status: DC

## 2018-04-04 MED ORDER — CYCLOBENZAPRINE HCL 5 MG PO TABS: 5.0000 mg | ORAL_TABLET | Freq: Three times a day (TID) | ORAL | 0 refills | Status: DC | PRN

## 2018-04-04 MED ORDER — OSMOLITE 1.5 CAL PO LIQD: 237.0000 mL | Freq: Four times a day (QID) | ORAL | 0 refills | Status: DC

## 2018-04-04 NOTE — Progress Notes (Signed)
D/w Dr. Leverne Humbles in detail. She talked to Mrs. Mazurek sister and does not feel that she is a danger to her self or other and that is safe to send her home from a psychiatric perspective

## 2018-04-04 NOTE — Consult Note (Addendum)
[facility] Consult   Reason for Consult:  Depression and anxiety affecting long term care goals Referring Physician:  Mr. Standley Brooking, [place] Patient Identification: Chelsey [name] MRN:  [id number] Principal Diagnosis: S/P repair of paraesophageal hernia Diagnosis:  Principal Problem:   S/P repair of paraesophageal hernia Active Problems:   Depression, major, recurrent, moderate ([place])   Panic attack   Pressure injury of skin   Insomnia   Protein-calorie malnutrition, severe   Dehydration  Patient seen, chart is reviewed. Total Time spent with patient: 1 hour  Subjective: "I don't know how they expect me to go home.  It hurts when I breathe, and I can't feed myself.  If they send me home like this, I think I will kill myself."  Psychiatry consult is requested to evaluate for worsening depression and anxiety.  HPI: Chelsey [name] is a 68 y.o. female patient admitted with dehydration, lethargy, and acute on chronic severe protein and caloric malnutrition19Days s/p attempted robotic-assisted type 3 paraesophageal hernia repair, converted to open repair with repair of intra-operative gastro-esophageal junction perforation, Nissen fundoplication, and placement of gastrojejunostomy tube for nutritional support([place],03/09/2018).  On evaluation, patient continues to endorse suicidal ideation with a plan to overdose if she is able. She does not think she can manage at home as she has miserable pain.  Patient describes having no support and believes that she is 'being kicked out of the hospital to take care of people who are really sick."  Patient denies any homicidal ideation.  She denies any auditory and visual hallucinations.  She denies any past history of mania.  Patient relates that she does not think she "would actually have the nerve to kill myself."  She admits that she made these comments out of frustration.  She further denies suicidal ideation, and does not desire  inpatient psychiatric admission.  She reports that she will follow-up with her established outpatient psychiatric provider.  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  Patient reports she had sustained an injury with a fall and hurt her shoulder and has had 3 surgeries since that time.  Patient most recently presented for abdominal surgery which had complications.  She has been having pain at her surgical insertion site of her G-J tube.  Patient reports that for the last 3-1/2 weeks she has been feeling like she wants to die.  She has been considering ways to complete a suicide, however notes that because she cannot swallow pills she is unable to kill herself.  Patient describes ongoing depressed mood with lack of interest in any activities.  She isolates herself.  She has a poor appetite, but has been forcing herself to eat, "because I know I need to."  She states that she has been sleeping okay while at home, however while in the hospital has not been sleeping well due to the noisiness.  Patient would like to get back to her usual activities which include it working, socializing with work friends, and attending her grandchildren's sporting events.  However, at this time she feels helpless and hopeless that she will ever get back to that state.   She reports that medications in the past for her depression and anxiety (Paxil and BuSpar) had been beneficial.   Past Psychiatric History: Depression and anxiety treated by Dr. Gretel Acre  Risk to Self:  Patient endorses suicidal thoughts, but has no plan or intent for suicide at this time. Risk to Others:  Denies Prior Inpatient Therapy:  Yes, patient reports inpatient psychiatric admission in 2018 after her shoulder injury and loss of job. Prior Outpatient Therapy:  Patient follows with Dr. Gretel Acre for visits  every 2 to 3 months.  She has been stable.  Patient is not currently in psychotherapy.  Past Medical History:  Past Medical History:  Diagnosis Date  . Breast cancer ([facility]) 1997   right breast, radiation  . Bronchitis    recent/ 06/09/15 had chest xray/ [facility]/resolved  . Cancer [facility]) 1997   right lumpectomy,L/Ad/R   . GERD (gastroesophageal reflux disease)   . History of hiatal hernia   . Hypercholesteremia   . Hyperlipidemia   . Low BP    TYPICALLY RUNS 80'S/60'S  . Panic attack   . Personal history of malignant neoplasm of breast     Past Surgical History:  Procedure Laterality Date  . BICEPT TENODESIS Left 11/23/2016   Procedure: BICEPS TENODESIS;  Surgeon: Leim Fabry, MD;  Location: [facility];  Service: Orthopedics;  Laterality: Left;  . BREAST EXCISIONAL BIOPSY Right 1997   pos  . BREAST SURGERY Right 1997   lumpectomy  . CHOLECYSTECTOMY N/A 08/06/2016   Procedure: LAPAROSCOPIC CHOLECYSTECTOMY WITH INTRAOPERATIVE CHOLANGIOGRAM;  Surgeon: Christene Lye, MD;  Location: [facility];  Service: General;  Laterality: N/A;  . COLONOSCOPY  2008  . COLONOSCOPY WITH PROPOFOL N/A 07/21/2015   Procedure: COLONOSCOPY WITH PROPOFOL;  Surgeon: Lucilla Lame, MD;  Location: [facility];  Service: Endoscopy;  Laterality: N/A;  . COLONOSCOPY WITH PROPOFOL N/A 01/05/2018   Procedure: COLONOSCOPY WITH PROPOFOL;  Surgeon: Jonathon Bellows, MD;  [facility]COPY;  Service: Gastroenterology;  Laterality: N/A;  . ESOPHAGOGASTRODUODENOSCOPY (EGD) WITH PROPOFOL N/A 06/16/2016   Procedure: ESOPHAGOGASTRODUODENOSCOPY (EGD) WITH PROPOFOL;  Surgeon: Christene Lye, MD;  Location: [facility] ENDOSCOPY;  Service: Endoscopy;  Laterality: N/A;  . ESOPHAGOGASTRODUODENOSCOPY (EGD) WITH PROPOFOL N/A 01/05/2018   Procedure: ESOPHAGOGASTRODUODENOSCOPY (EGD) WITH PROPOFOL;  Surgeon: Jonathon Bellows, MD;  Location: [facility]COPY;  Service: Gastroenterology;  Laterality: N/A;  . HARDWARE  REMOVAL Left 12/27/2016   Procedure: HARDWARE REMOVAL LEFT  PROXIMAL HUMEROUS;  Surgeon: Leim Fabry, MD;  Location: [facility];  Service: Orthopedics;  Laterality: Left;  . NISSEN FUNDOPLICATION N/A 09/12/2991   Procedure: NISSEN FUNDOPLICATION;  Surgeon: Jules Husbands, MD;  Location: [facility];  Service: General;  Laterality: N/A;  . ORIF HUMERUS FRACTURE Left 11/23/2016   Procedure: OPEN REDUCTION INTERNAL FIXATION (ORIF) PROXIMAL HUMERUS FRACTURE;  Surgeon: Leim Fabry, MD;  Location: [facility];  Service: Orthopedics;  Laterality: Left;  . REPAIR OF ESOPHAGUS  03/09/2018   Procedure: REPAIR OF ESOPHAGUS;  Surgeon: Jules Husbands, MD;  Location: [facility];  Service: General;;  . REVERSE SHOULDER ARTHROPLASTY Left 12/27/2016   Procedure: REVERSE SHOULDER ARTHROPLASTY;  Surgeon: Leim Fabry, MD;  Location: [facility];  Service: Orthopedics;  Laterality: Left;  . ROBOTIC ASSISTED LAPAROSCOPIC REPAIR OF PARAESOPHAGEAL HERNIA N/A 03/09/2018   Procedure: ROBOTIC HIATAL HERNIA CONVERTED TO OPEN;  Surgeon: Jules Husbands, MD;  Location: [facility];  Service: General;  Laterality: N/A;   Family History:  Family History  Problem Relation Age of Onset  . Anxiety disorder Brother   . Obesity Brother   . COPD Brother   . Kidney disease Mother   . Heart attack Father   . Hypertension Father   . Alcohol abuse  Father   . Depression Brother   . Anxiety disorder Brother   . Breast cancer Maternal Grandmother    Family Psychiatric  History: Brother with depression x 2 and anxiety x1; father with alcoholism  Social History:  Social History   Substance and Sexual Activity  Alcohol Use No  . Alcohol/week: 0.0 standard drinks     Social History   Substance and Sexual Activity  Drug Use No    Social History   Socioeconomic History  . Marital status: Widowed    Spouse name: Not on file  . Number of children: 2  . Years of education: Not on file  . Highest education level: Bachelor's degree (e.g., BA,  AB, BS)  Occupational History  . Not on file  Social Needs  . Financial resource strain: Hard  . Food insecurity:    Worry: Never true    Inability: Never true  . Transportation needs:    Medical: No    Non-medical: No  Tobacco Use  . Smoking status: Former Smoker    Packs/day: 0.25    Years: 10.00    Pack years: 2.50    Types: Cigarettes    Start date: 11/25/2009  . Smokeless tobacco: Never Used  . Tobacco comment: 1 cigarette daily  Substance and Sexual Activity  . Alcohol use: No    Alcohol/week: 0.0 standard drinks  . Drug use: No  . Sexual activity: Never  Lifestyle  . Physical activity:    Days per week: 0 days    Minutes per session: 0 min  . Stress: Very much  Relationships  . Social connections:    Talks on phone: Not on file    Gets together: Not on file    Attends religious service: Never    Active member of club or organization: No    Attends meetings of clubs or organizations: Never    Relationship status: Widowed  Other Topics Concern  . Not on file  Social History Narrative  . Not on file   Additional Social History:  Patient was married for 24 years, and has been widowed for 20 years after her husband succumbed to colon cancer. She has been living with her 59 year old son. She has a 62 year old son who lives in [place].  She has 4 grandchildren.  Allergies:  No Known Allergies  Labs:  Results for orders placed or performed during the hospital encounter of 03/28/18 (from the past 48 hour(s))  Body fluid culture     Status: None (Preliminary result)   Collection Time: 04/03/18  2:23 PM  Result Value Ref Range   Specimen Description      PLEURAL Performed at [facility], [address] [place], [place] [address]    Special Requests      NONE Performed at [facility], [place], [place] [address]    Gram Stain      FEW WBC PRESENT,BOTH PMN AND MONONUCLEAR NO ORGANISMS SEEN    Culture      NO GROWTH < 24  HOURS Performed at [facility], [place] [address] [place], [place] [address]    Report Status PENDING   Pathologist smear review     Status: None   Collection Time: 04/03/18  2:23 PM  Result Value Ref Range   Path Review Cytospin slide of left pleural fluid reviewed.     Comment: The fluid contains abundant neutrophils. Some macrophages and scattered reactive mesothelial cells are also present. No  malignant cells are seen.  Reviewed by Lemmie Evens. Dicie Beam, MD. Performed at [facility], [address] [place], [place] [address]     Current Facility-Administered Medications  Medication Dose Route Frequency Provider Last Rate Last Dose  . busPIRone (BUSPAR) tablet 10 mg  10 mg Oral BID Tylene Fantasia, PA-C   10 mg at 04/03/18 2207  . cyclobenzaprine (FLEXERIL) tablet 5 mg  5 mg Oral TID PRN Tylene Fantasia, PA-C      . diphenhydrAMINE (BENADRYL) capsule 25 mg  25 mg Oral Q6H PRN Jules Husbands, MD   25 mg at 03/30/18 2150  . docusate sodium (COLACE) capsule 100 mg  100 mg Oral Daily PRN Tylene Fantasia, PA-C   100 mg at 03/31/18 1410  . enoxaparin (LOVENOX) injection 40 mg  40 mg Subcutaneous Q24H Dallie Piles, RPH   40 mg at 04/03/18 2207  . feeding supplement (ENSURE ENLIVE) (ENSURE ENLIVE) liquid 237 mL  237 mL Oral BID BM Tylene Fantasia, PA-C   237 mL at 04/03/18 1454  . feeding supplement (OSMOLITE 1.5 CAL) liquid 237 mL  237 mL Per Tube QID Caroleen Hamman F, MD   237 mL at 04/03/18 2207  . free water 50 mL  50 mL Per Tube QID Caroleen Hamman F, MD   50 mL at 04/03/18 2208  . gabapentin (NEURONTIN) capsule 300 mg  300 mg Oral TID Tylene Fantasia, PA-C   300 mg at 04/03/18 2206  . HYDROcodone-acetaminophen (HYCET) 7.5-325 mg/15 ml solution 15 mL  15 mL Oral QID PRN Tylene Fantasia, PA-C   15 mL at 04/03/18 2344  . ibuprofen (ADVIL,MOTRIN) tablet 200-400 mg  200-400 mg Oral Daily PRN Tylene Fantasia, PA-C      . magnesium hydroxide (MILK OF MAGNESIA) suspension 30  mL  30 mL Oral Daily PRN Fredirick Maudlin, MD   30 mL at 04/01/18 1218  . morphine 2 MG/ML injection 2 mg  2 mg Intravenous Q3H PRN Tylene Fantasia, PA-C   2 mg at 04/02/18 2152  . multivitamin liquid 15 mL  15 mL Per Tube Daily Tylene Fantasia, PA-C   15 mL at 04/03/18 1026  . ondansetron (ZOFRAN-ODT) disintegrating tablet 4 mg  4 mg Oral Q6H PRN Tylene Fantasia, PA-C       Or  . ondansetron [facility]) injection 4 mg  4 mg Intravenous Q6H PRN Edison Simon R, PA-C   4 mg at 03/30/18 1247  . pantoprazole (PROTONIX) EC tablet 40 mg  40 mg Oral Daily Tylene Fantasia, PA-C   40 mg at 04/03/18 1026  . PARoxetine (PAXIL) tablet 40 mg  40 mg Oral Daily Lavella Hammock, MD   40 mg at 04/03/18 1820  . simethicone (MYLICON) chewable tablet 80 mg  80 mg Oral Daily PRN Tylene Fantasia, PA-C   80 mg at 03/31/18 1634  . simvastatin (ZOCOR) tablet 20 mg  20 mg Oral QHS Tylene Fantasia, PA-C   20 mg at 04/03/18 2207  . traZODone (DESYREL) tablet 50 mg  50 mg Oral QHS Tylene Fantasia, PA-C   50 mg at 04/03/18 2206  . vitamin C (ASCORBIC ACID) tablet 250 mg  250 mg Per Tube BID Tylene Fantasia, PA-C   250 mg at 04/03/18 2207    Musculoskeletal: Strength & Muscle Tone: decreased Gait & Station: unsteady Patient leans: N/A  Psychiatric Specialty Exam: Physical Exam  Nursing note and vitals  reviewed. Constitutional: She is oriented to person, place, and time. She appears well-nourished. No distress.  HENT:  Head: Atraumatic.  Eyes: EOM are normal.  Cardiovascular: Normal rate.  Respiratory: Effort normal. No respiratory distress.  Musculoskeletal: Normal range of motion.  Neurological: She is alert and oriented to person, place, and time.  Skin: Skin is warm and dry.    Review of Systems  Constitutional: Positive for weight loss.  HENT: Negative.   Respiratory: Negative.   Cardiovascular: Negative.   Gastrointestinal: Positive for abdominal pain.  Musculoskeletal: Positive for  myalgias.  Neurological: Negative.   Psychiatric/Behavioral: Positive for depression and suicidal ideas (passive). Negative for hallucinations, memory loss and substance abuse. The patient is nervous/anxious. The patient does not have insomnia.     Blood pressure 96/67, pulse 88, temperature 98 F (36.7 C), temperature source Oral, resp. rate 18, height 5\' 5"  (1.651 m), weight 52.5 kg, SpO2 98 %.Body mass index is 19.27 kg/m.  General Appearance: Fairly Groomed  Eye Contact:  Good  Speech:  Clear and Coherent and Normal Rate  Volume:  Decreased  Mood:  Anxious and Depressed  Affect:  Congruent  Thought Process:  Linear and Descriptions of Associations: Tangential  Orientation:  Full (Time, Place, and Person)  Thought Content:  Hallucinations: None, Rumination and Tangential  Suicidal Thoughts:  Yes.  without intent/plan  Homicidal Thoughts:  No  Memory:  good  Judgement:  Impaired  Insight:  Present  Psychomotor Activity:  Restlessness  Concentration:  Concentration: Fair  Recall:  [place] of Knowledge:  Good  Language:  Good  Akathisia:  No  Handed:  Right  AIMS (if indicated):     Assets:  Communication Skills Desire for Improvement Financial Resources/Insurance Housing Social Support  ADL's:  Impaired  Cognition:  WNL  Sleep:   decreased while hospitalized    Treatment Plan Summary: Daily contact with patient to assess and evaluate symptoms and progress in treatment and Medication management  Continue Paxil 40 mg daily for depression and anxiety, due to increased depression and anxiety related to postoperative complications . Uncertain if patient's medication absorption has been affected by surgery.  Encourage good pain management through open communication with nursing.  Consultation placed for chaplain- completed.  Disposition: Supportive therapy provided about ongoing stressors. We will continue to follow patient when hospitalized.  Recommend patient be  started with psychotherapy after discharge.  I have contacted patient's outpatient psychiatrist for appropriate referral to therapist in their office. as she would likely benefit. Patient is also expressing interest in psychotherapy after discharge.  I have ordered respiratory therapy to assist patient with cough/deep breathing and incentive spirometry. I have ordered physical therapy consult to assess patient's functional capacity for placement.   Addendum:  Spoke with sister, Florestine Avers, who was able to provide collateral regarding patient's mental wellbeing.  Sister denies that patient has history of suicide attempts or self-harm.  She does agree that patient is feeling overwhelmed and depressed with her postoperative care and pain management.  Ms. Laurann Montana reports that she will be attending to the patient in her home twice daily to assist her with her medication management and feedings.  Sister is made aware of patient's upcoming appointment with her outpatient psychiatrist on April 6 as well as the need to schedule her for psychotherapy.  Sister will be taking patient to appointments and will schedule a sooner appointment if she feels Ms. Hill is in need of 1.  Sister has also been advised that  should she become concerned of patient having suicidal ideation with plan or intent that she should be brought to the nearest emergency department or call 911 in order for patient to be reevaluated by psychiatry.  They should also not hesitate to return patient to the nearest emergency department should her medical condition decompensate due to patient's poor intake and or weight loss.  Sister verbalizes understanding.  Patient has been cleared by surgery for discharge.  Patient has been was able to engage in safety planning including plan to return to nearest emergency department or contact emergency services if she feels unable to maintain her own safety or the safety of others. Pt had no further questions,  comments, or concerns.  Patient can be discharged from a psychiatric standpoint into the care of her sister, who is agreeable to maintaining patient's safety.   Lavella Hammock, MD 04/04/2018 9:38 AM

## 2018-04-04 NOTE — Care Management Important Message (Signed)
Copy of signed Medicare IM left with patient in room. 

## 2018-04-04 NOTE — Care Management Note (Signed)
Case Management Note  Patient Details  Name: Chelsey [name] MRN: [id number] Date of Birth: [birth year]   Patient to discharge home today.  Cassie with [facility] notified of discharge.  Patient and family have all been educated and have been successful with bolus tube feedings. Order for home feeds was sent to [facility] with [facility].  Feeds to be delivered to home today.   Subjective/Objective:                    Action/Plan:   Expected Discharge Date:  04/04/18               Expected Discharge Plan:  [place]  In-House Referral:     Discharge planning Services  CM Consult  Post Acute Care Choice:  Home Health, Resumption of Svcs/PTA Provider Choice offered to:     DME Arranged:    DME Agency:     HH Arranged:  RN, PT HH Agency:  [facility]  Status of Service:  Completed, signed off  If discussed at [place] of Stay Meetings, dates discussed:    Additional Comments:  Beverly Sessions, RN 04/04/2018, 5:10 PM

## 2018-04-04 NOTE — Progress Notes (Addendum)
Physical Therapy Treatment Patient Details Name: Chelsey [name] MRN: [id number] DOB: 1951/01/15 Today's Date: 04/04/2018    History of Present Illness admitted as direct admit from surgical office due to abdominal pain, dehydration and difficulty managing tube feeds.  Of note, patient s/p open hital hernia repair and GE junction perforation repair with nissan fundoplication and placement of gastrojejnostomy tube during previous admission (03/09/18).     PT Comments    Pt in bed upon arrival psych MD in room.  Pt voicing fear of going home and had voiced thoughts of self harm resulting in psych consult.  Pt voicing fear of going home and not being able to manage her tube feedings and general ADL tasks.  She reports increased and continued pain in L flank and tube feeling insertion sight "Stinging."  Bed mobility remains without assist.  She is able to walk with decreased gait quality and speed and is limited to nursing desk and back due to pain and weakness.  Upon return to bed she stated she needed to use the bathroom.  She did walk to and from with some improved gait but deficits remain.  She did belch several times during session with mobility.  Encouragement given but she continued to state that she could not go home and that we were tired of dealing with her and wanted her to go home.  Encouragement again given.  Pt initial discharge recommendations were for HHPT.  Pt would benefit from SNF for general strengthening and pt's concerns over managing tube feedings.  Pt did present with decreased gait quality today but that could be related to overall anxiety with discharge plan.     Follow Up Recommendations  Follow surgeon's recommendation for DC plan and follow-up therapies     Equipment Recommendations       Recommendations for Other Services       Precautions / Restrictions Precautions Precautions: Fall Precaution Comments:  gastrojejnostomy tube, JP drain, abdominal  incision Restrictions Weight Bearing Restrictions: No    Mobility  Bed Mobility Overal bed mobility: Modified Independent                Transfers Overall transfer level: Modified independent   Transfers: Sit to/from Stand Sit to Stand: Supervision;Min guard            Ambulation/Gait Ambulation/Gait assistance: Supervision Gait Distance (Feet): 50 Feet Assistive device: None Gait Pattern/deviations: Step-through pattern;Decreased step length - right;Decreased step length - left         Stairs             Wheelchair Mobility    Modified [place] (Stroke Patients Only)       Balance Overall balance assessment: Needs assistance Sitting-balance support: Feet supported Sitting balance-Leahy Scale: Good     Standing balance support: No upper extremity supported Standing balance-Leahy Scale: Fair                              Cognition Arousal/Alertness: Awake/alert Behavior During Therapy: Anxious Overall Cognitive Status: Within Functional Limits for tasks assessed                                        Exercises      General Comments        Pertinent Vitals/Pain Pain Assessment: 0-10 Pain Score: 10-Worst pain ever Pain Location: addomen,  side, left flank Pain Descriptors / Indicators: Grimacing;Guarding;Moaning;Tightness Pain Intervention(s): Limited activity within patient's tolerance;Monitored during session    Home Living                      Prior Function            PT Goals (current goals can now be found in the care plan section) Progress towards PT goals: Not progressing toward goals - comment    Frequency    Min 2X/week      PT Plan Current plan remains appropriate    Co-evaluation              AM-PAC PT "6 Clicks" Mobility   Outcome Measure  Help needed turning from your back to your side while in a flat bed without using bedrails?: None Help needed moving from  lying on your back to sitting on the side of a flat bed without using bedrails?: None Help needed moving to and from a bed to a chair (including a wheelchair)?: None Help needed standing up from a chair using your arms (e.g., wheelchair or bedside chair)?: None Help needed to walk in hospital room?: A Little Help needed climbing 3-5 steps with a railing? : A Little 6 Click Score: 22    End of Session Equipment Utilized During Treatment: Gait belt Activity Tolerance: Patient limited by pain;Other (comment) Patient left: in bed;with call bell/phone within reach         Time: 1144-1156 PT Time Calculation (min) (ACUTE ONLY): 12 min  Charges:  $Gait Training: 8-22 mins                    Chesley Noon, PTA 04/04/18, 12:06 PM

## 2018-04-04 NOTE — Discharge Instructions (Signed)
CONTINUE TO SUPPLEMENT MEALS WITH 1 CAN OF OSMOLITE (1.5) FOUR TIMES DAILY.

## 2018-04-04 NOTE — Discharge Summary (Signed)
[facility] SURGICAL DISCHARGE SUMMARY  Patient ID: Chelsey [name] MRN: [id number] DOB/AGE: April 29, 1950 68 y.o.  Admit date: 03/28/2018 Discharge date: 04/04/2018  Discharge Diagnoses Patient Active Problem List   Diagnosis Date Noted  . Dehydration 03/28/2018  . Protein-calorie malnutrition, severe 03/11/2018  . S/P repair of paraesophageal hernia 03/09/2018  . Benign neoplasm of ascending colon   . Osteoporosis 07/27/2017  . Tobacco abuse 07/27/2017  . Painful orthopaedic hardware ([place]) 06/24/2017  . Overweight with body mass index (BMI) 25.0-29.9 05/18/2017  . 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 Insomnia 01/12/2017  . Anemia 01/12/2017  . Status post shoulder replacement 12/27/2016  . Pressure injury of skin 12/27/2016  . S/P ORIF (open reduction internal fixation) fracture 11/23/2016  . Proximal humerus fracture 11/23/2016  . Panic attack 08/17/2016  . Abdominal pain 05/21/2016  . Special screening for malignant neoplasms, colon   . Benign neoplasm of descending colon   . Benign neoplasm of sigmoid colon   . Gastroesophageal reflux disease without esophagitis 06/13/2014  . H/O hypercholesterolemia 06/13/2014  . Depression, major, recurrent, moderate ([place]) 06/13/2014  . History of breast cancer 03/12/2013    Consultants Interventional Radiology Psychiatry  Nutrition   Procedures 04/03/2018:  Thoracentesis  HPI: 68 y.o.Femalepresents to clinic for subsequentpost-op follow-up19Days s/p attempted robotic-assisted type 3 paraesophageal hernia repair, converted to open repair with repair of intra-operative gastro-esophageal junction perforation, Nissen fundoplication, and placement of gastrojejunostomy tube for nutritional support(Pabon,03/09/2018). Patient reportsshe's lost 80 lbs over the past 1 year, including long-prior to her recent surgery and has lost another 3 lbs over the past 5 days, during which time she says she's  been belching frequently and can only tolerate small sips of water with vomiting of all other foods/liquids by mouth.She adds that she feels lightheaded and lethargic, states she feels she needs to be admitted to [facility].  Utilization of her gastrojejunostomy tube for nutritional support and hydration have been advised during multiplerecentencounters, but patient states she doesn't know how and her sister (also present today)says she is not available consistently enough for her sister to rely on her for nutrition, adding the last time she attempted to instill tube feeds through her sister's gastrojejunostomy tube,the tubefeeds spilled all overherand her sister. Patient reports constant LUQ pain, though says her pain is essentially unchanged despite having decreased her narcotic pain medication frequency to only twice daily. Patient otherwise last passed a small "sticky" BM 2 days ago anddenies fever/chills, CP, or SOB.  Hospital Course: She was admitted to the general surgery service and fluid resuscitation was initiated. Nutrition was consulted on arrival and tube feedings to supplement meals were initiated. The patient and her family were continually extensively educated regarding using the g-tube correctly and how often, which she appeared competent with on discharge. She was again evaluated by PT throughout his re-admission, which recommended home health PT.   Over the course of the hospital stay her labs were again trended and she was re-imaged multiple times which did not demonstrate any esophageal leak only a known seroma in the chest which was seen in outpatient follow up. She additionally was found to have a slightly enlarging left pleural effusion which was aspirated on 03/09 with interventional radiology.   She became tearful with discharge planning and appeared very depressed regarding her condition. Psychiatry was consulted and recommended starting psychotherapy as an outpatient and  return to her outpatient psychiatrist.   On the day of discharge (03/10), she was tolerating a full  liquid diet and should remain on this as an outpatient. She was tolerating and demonstrating proper tube feeding and understands she must continue to do this 4 times a day (Osmolite 1.5). She was mobilizing well, her pain reasonably controlled, and she was having bowel function. All of her questions and concerns were addressed and answered.     Discharge Condition: Fair   Physical Examination:  Constitutional: alert, cooperative and no distress  Respiratory: breathing non-labored at rest Gastrointestinal: soft,non-tender, and non-distended, no rebound/guarding. Gastrojejunostomy tube in LUQ. Integumentary:well healingmidline laparotomy incision is CDI. No erythema, no drainage.    Allergies as of 04/04/2018   No Known Allergies     Medication List    TAKE these medications   busPIRone 10 MG tablet Commonly known as:  BUSPAR Take 1 tablet (10 mg total) by mouth 2 (two) times daily.   CALCIUM + D PO Take 1 tablet daily by mouth.   cyclobenzaprine 5 MG tablet Commonly known as:  FLEXERIL Take 1 tablet (5 mg total) by mouth 3 (three) times daily as needed for muscle spasms.   feeding supplement (OSMOLITE 1.5 CAL) Liqd Place 237 mLs into feeding tube 4 (four) times daily.   gabapentin 300 MG capsule Commonly known as:  NEURONTIN Take 1 capsule (300 mg total) by mouth 3 (three) times daily.   GAS-X PO Take 1-2 tablets by mouth daily as needed (gas).   HYDROcodone-acetaminophen 7.5-325 mg/15 ml solution Commonly known as:  HYCET Take 15 mLs by mouth 4 (four) times daily as needed for up to 30 doses for moderate pain.   IBGARD PO Take 2 tablets by mouth daily before supper.   ibuprofen 200 MG tablet Commonly known as:  ADVIL,MOTRIN Take 200-400 mg by mouth daily as needed for headache or moderate pain.   multivitamin tablet Take 1 tablet daily by mouth.   omeprazole  20 MG capsule Commonly known as:  PRILOSEC Take 1 capsule (20 mg total) by mouth 2 (two) times daily before a meal.   PARoxetine 20 MG tablet Commonly known as:  PAXIL Take 1 tablet (20 mg total) by mouth daily before supper.   simvastatin 20 MG tablet Commonly known as:  ZOCOR TAKE ONE TABLET BY MOUTH AT BEDTIME   traZODone 100 MG tablet Commonly known as:  DESYREL Take 1 tablet (100 mg total) by mouth at bedtime.   Vitamin D 50 MCG (2000 UT) tablet Take 2,000 Units by mouth daily.        Follow-up Information    Pabon, [place] F, MD. Schedule an appointment as soon as possible for a visit in 1 week(s).   Specialty:  General Surgery Why:  failure to thrive, s/p hiatal hernia and GE junction repair, has GJ tube Contact information: [address] [place] [address] [phone]            -- Edison Simon , PA-C [facility]  04/04/2018, 2:17 PM [phone] M-F: 7am - 4pm

## 2018-04-04 NOTE — Plan of Care (Signed)
  Problem: Safety: Goal: Ability to remain free from injury will improve Outcome: Progressing   Problem: Pain Managment: Goal: General experience of comfort will improve Outcome: Progressing   Problem: Elimination: Goal: Will not experience complications related to bowel motility Outcome: Progressing Goal: Will not experience complications related to urinary retention Outcome: Progressing   Problem: Nutrition: Goal: Adequate nutrition will be maintained Outcome: Progressing  Patietn self administering feeds

## 2018-04-04 NOTE — Progress Notes (Signed)
Chelsey [name] to be D/C'd home per MD order.  Discussed prescriptions and follow up appointments with the patient. Prescriptions given to patient, medication list explained in detail. Pt verbalized understanding.  Allergies as of 04/04/2018   No Known Allergies     Medication List    TAKE these medications   busPIRone 10 MG tablet Commonly known as:  BUSPAR Take 1 tablet (10 mg total) by mouth 2 (two) times daily.   CALCIUM + D PO Take 1 tablet daily by mouth.   cyclobenzaprine 5 MG tablet Commonly known as:  FLEXERIL Take 1 tablet (5 mg total) by mouth 3 (three) times daily as needed for muscle spasms.   feeding supplement (OSMOLITE 1.5 CAL) Liqd Place 237 mLs into feeding tube 4 (four) times daily.   gabapentin 300 MG capsule Commonly known as:  NEURONTIN Take 1 capsule (300 mg total) by mouth 3 (three) times daily.   GAS-X PO Take 1-2 tablets by mouth daily as needed (gas).   HYDROcodone-acetaminophen 7.5-325 mg/15 ml solution Commonly known as:  HYCET Take 15 mLs by mouth 4 (four) times daily as needed for up to 30 doses for moderate pain.   IBGARD PO Take 2 tablets by mouth daily before supper.   ibuprofen 200 MG tablet Commonly known as:  ADVIL,MOTRIN Take 200-400 mg by mouth daily as needed for headache or moderate pain.   multivitamin tablet Take 1 tablet daily by mouth.   omeprazole 20 MG capsule Commonly known as:  PRILOSEC Take 1 capsule (20 mg total) by mouth 2 (two) times daily before a meal.   PARoxetine 20 MG tablet Commonly known as:  PAXIL Take 1 tablet (20 mg total) by mouth daily before supper.   simvastatin 20 MG tablet Commonly known as:  ZOCOR TAKE ONE TABLET BY MOUTH AT BEDTIME   traZODone 100 MG tablet Commonly known as:  DESYREL Take 1 tablet (100 mg total) by mouth at bedtime.   Vitamin D 50 MCG (2000 UT) tablet Take 2,000 Units by mouth daily.       Vitals:   04/04/18 0608 04/04/18 1240  BP: 96/67 (!) 94/56  Pulse: 88  90  Resp: 18 16  Temp: 98 F (36.7 C) 98.5 F (36.9 C)  SpO2: 98% 100%    Skin clean, dry and intact without evidence of skin break down, no evidence of skin tears noted. IV catheter discontinued intact. Site without signs and symptoms of complications. Dressing and pressure applied. Pt denies pain at this time. No complaints noted.  An After Visit Summary was printed and given to the patient. Patient escorted via [employer], and D/C home via private auto. Patient taught and demonstrated thorough proof of PEG management. Patient able to lush and administer feeds completely on her own.   Chelsey [name]

## 2018-04-05 ENCOUNTER — Telehealth: Payer: Self-pay | Admitting: *Deleted

## 2018-04-05 NOTE — Telephone Encounter (Signed)
Jenny Reichmann from [facility] called and stated that she went out to see the patient this morning and patient is still complaining that she is in a lot of pain, she has not been taking her pain medication. Patient finally took it once cindy arrived to help patient. Jenny Reichmann also stated that she had trouble getting her nutrients down her gtube. She had to put water in it for it to go down. Patient is having problems doing it herself and wants help on this. Please call and advise

## 2018-04-05 NOTE — Telephone Encounter (Signed)
Patient states she is having pain from her G-tube. She states she was having short of breath prior to leaving the hospital.  I spoke with Dr.Pabon-encourage patient to continue to drink Ensure/boost by mouth as well as G-tube feedings. Patient was encouraged to take medications as directed for pain and discomfort. Patient was instructed to go to emergency room if pain persisted or worsens.    Spoke with Clawson from [facility].  Jenny Reichmann stated the home is very unsanitary. Jenny Reichmann will speak with social worker to see if there is something can be done regarding placement in nursing facility regarding the care of this patient and patient not comprehending or complying with the care instructions that need to be done on daily basis.  Jenny Reichmann stated the patient begged to be placed at a facility and that she had no one to assist with g-tube feedings.   Jenny Reichmann stated OT will visit the patient tomorrow. Jenny Reichmann was asked to keep our office informed on any information regarding the patient.   Patient also has stage 1 buttock ulcer and foam dressing changes are being done by home health.   Informed Dr.Pabon of the above.

## 2018-04-07 LAB — BODY FLUID CULTURE: Culture: NO GROWTH

## 2018-04-10 ENCOUNTER — Other Ambulatory Visit
Admission: RE | Admit: 2018-04-10 | Discharge: 2018-04-10 | Disposition: A | Discharge status: Home / Self Care | Payer: Federal, State, Local not specified - PPO | Source: Ambulatory Visit | Attending: Surgery | Admitting: Surgery

## 2018-04-10 ENCOUNTER — Other Ambulatory Visit: Payer: Self-pay

## 2018-04-10 ENCOUNTER — Ambulatory Visit
Admission: RE | Admit: 2018-04-10 | Discharge: 2018-04-10 | Disposition: A | Discharge status: Home / Self Care | Payer: Federal, State, Local not specified - PPO | Source: Ambulatory Visit | Attending: Surgery | Admitting: Surgery

## 2018-04-10 ENCOUNTER — Ambulatory Visit (INDEPENDENT_AMBULATORY_CARE_PROVIDER_SITE_OTHER): Payer: Federal, State, Local not specified - PPO | Admitting: Surgery

## 2018-04-10 ENCOUNTER — Encounter: Payer: Self-pay | Admitting: Surgery

## 2018-04-10 VITALS — BP 91/67 | HR 99 | Temp 97.8°F | Ht 65.0 in | Wt 117.0 lb

## 2018-04-10 DIAGNOSIS — E43 Unspecified severe protein-calorie malnutrition: Secondary | ICD-10-CM

## 2018-04-10 DIAGNOSIS — R188 Other ascites: Secondary | ICD-10-CM | POA: Diagnosis present

## 2018-04-10 LAB — CBC WITH DIFFERENTIAL/PLATELET
Abs Immature Granulocytes: 0.04 10*3/uL (ref 0.00–0.07)
Basophils Absolute: 0.1 10*3/uL (ref 0.0–0.1)
Basophils Relative: 1 %
Eosinophils Absolute: 0.2 10*3/uL (ref 0.0–0.5)
Eosinophils Relative: 2 %
HCT: 36.5 % (ref 36.0–46.0)
Hemoglobin: 11.7 g/dL — ABNORMAL LOW (ref 12.0–15.0)
Immature Granulocytes: 1 %
LYMPHS ABS: 1.1 10*3/uL (ref 0.7–4.0)
Lymphocytes Relative: 13 %
MCH: 30.3 pg (ref 26.0–34.0)
MCHC: 32.1 g/dL (ref 30.0–36.0)
MCV: 94.6 fL (ref 80.0–100.0)
MONOS PCT: 9 %
Monocytes Absolute: 0.7 10*3/uL (ref 0.1–1.0)
Neutro Abs: 6.4 10*3/uL (ref 1.7–7.7)
Neutrophils Relative %: 74 %
Platelets: 594 10*3/uL — ABNORMAL HIGH (ref 150–400)
RBC: 3.86 MIL/uL — ABNORMAL LOW (ref 3.87–5.11)
RDW: 12.4 % (ref 11.5–15.5)
WBC: 8.5 10*3/uL (ref 4.0–10.5)
nRBC: 0 % (ref 0.0–0.2)

## 2018-04-10 LAB — COMPREHENSIVE METABOLIC PANEL
ALBUMIN: 3 g/dL — AB (ref 3.5–5.0)
ALT: 90 U/L — ABNORMAL HIGH (ref 0–44)
AST: 33 U/L (ref 15–41)
Alkaline Phosphatase: 454 U/L — ABNORMAL HIGH (ref 38–126)
Anion gap: 11 (ref 5–15)
BUN: 15 mg/dL (ref 8–23)
CO2: 27 mmol/L (ref 22–32)
Calcium: 9.2 mg/dL (ref 8.9–10.3)
Chloride: 97 mmol/L — ABNORMAL LOW (ref 98–111)
Creatinine, Ser: 0.59 mg/dL (ref 0.44–1.00)
GFR calc Af Amer: >60 mL/min (ref >60)
GFR calc non Af Amer: >60 mL/min (ref >60)
GLUCOSE: 100 mg/dL — AB (ref 70–99)
Potassium: 4.8 mmol/L (ref 3.5–5.1)
Sodium: 135 mmol/L (ref 135–145)
Total Bilirubin: 0.8 mg/dL (ref 0.3–1.2)
Total Protein: 7.5 g/dL (ref 6.5–8.1)

## 2018-04-10 NOTE — Progress Notes (Signed)
Outpatient Surgical Follow Up  04/10/2018  Chelsey [name] is an 68 y.o. female.   Chief Complaint  Patient presents with  . Other    HPI: s/p Open paraesophageal hernia repair w esophageal repair for perforation. She did have gastropexy and G tube. She has had extensive w/u including 2 CT chest abd, and esophagogram. They all demonstrate no evidence of leak or abscess. She does have a mediastinal seroma that is not unusual after these giant paraesophageal hernias. She takes some full liquids but experiences early satiety. She is using her G tube for ensure but due to her cognitive deficits she has a difficult time using it.  Sister who is the only available family is unable to assist her daily. She experiences some intermittent pain moderate in nature but she takes the prescribed norco sporadically. She is unable to swallow gabapentin po. Refers some constipation but has not been compliant w miralax. She does reports pain around feeding tube.  She is severely depressed   Past Medical History:  Diagnosis Date  . Breast cancer ([place]) 1997   right breast, radiation  . Bronchitis    recent/ 06/09/15 had chest xray/ [facility]/resolved  . Cancer [facility]) 1997   right lumpectomy,L/Ad/R   . GERD (gastroesophageal reflux disease)   . History of hiatal hernia   . Hypercholesteremia   . Hyperlipidemia   . Low BP    TYPICALLY RUNS 80'S/60'S  . Panic attack   . Personal history of malignant neoplasm of breast     Past Surgical History:  Procedure Laterality Date  . BICEPT TENODESIS Left 11/23/2016   Procedure: BICEPS TENODESIS;  Surgeon: Leim Fabry, MD;  Location: [facility] ORS;  Service: Orthopedics;  Laterality: Left;  . BREAST EXCISIONAL BIOPSY Right 1997   pos  . BREAST SURGERY Right 1997   lumpectomy  . CHOLECYSTECTOMY N/A 08/06/2016   Procedure: LAPAROSCOPIC CHOLECYSTECTOMY WITH INTRAOPERATIVE CHOLANGIOGRAM;  Surgeon: Christene Lye, MD;  Location: [facility] ORS;  Service:  General;  Laterality: N/A;  . COLONOSCOPY  2008  . COLONOSCOPY WITH PROPOFOL N/A 07/21/2015   Procedure: COLONOSCOPY WITH PROPOFOL;  Surgeon: Lucilla Lame, MD;  Location: [facility];  Service: Endoscopy;  Laterality: N/A;  . COLONOSCOPY WITH PROPOFOL N/A 01/05/2018   Procedure: COLONOSCOPY WITH PROPOFOL;  Surgeon: Jonathon Bellows, MD;  [facility] ENDOSCOPY;  Service: Gastroenterology;  Laterality: N/A;  . ESOPHAGOGASTRODUODENOSCOPY (EGD) WITH PROPOFOL N/A 06/16/2016   Procedure: ESOPHAGOGASTRODUODENOSCOPY (EGD) WITH PROPOFOL;  Surgeon: Christene Lye, MD;  Location: [facility] ENDOSCOPY;  Service: Endoscopy;  Laterality: N/A;  . ESOPHAGOGASTRODUODENOSCOPY (EGD) WITH PROPOFOL N/A 01/05/2018   Procedure: ESOPHAGOGASTRODUODENOSCOPY (EGD) WITH PROPOFOL;  Surgeon: Jonathon Bellows, MD;  Location: [facility] ENDOSCOPY;  Service: Gastroenterology;  Laterality: N/A;  . HARDWARE REMOVAL Left 12/27/2016   Procedure: HARDWARE REMOVAL LEFT  PROXIMAL HUMEROUS;  Surgeon: Leim Fabry, MD;  Location: [facility] ORS;  Service: Orthopedics;  Laterality: Left;  . NISSEN FUNDOPLICATION N/A 9/60/4540   Procedure: NISSEN FUNDOPLICATION;  Surgeon: Jules Husbands, MD;  Location: [facility] ORS;  Service: General;  Laterality: N/A;  . ORIF HUMERUS FRACTURE Left 11/23/2016   Procedure: OPEN REDUCTION INTERNAL FIXATION (ORIF) PROXIMAL HUMERUS FRACTURE;  Surgeon: Leim Fabry, MD;  Location: [facility] ORS;  Service: Orthopedics;  Laterality: Left;  . REPAIR OF ESOPHAGUS  03/09/2018   Procedure: REPAIR OF ESOPHAGUS;  Surgeon: Jules Husbands, MD;  Location: [facility] ORS;  Service: General;;  . REVERSE SHOULDER ARTHROPLASTY Left 12/27/2016   Procedure: REVERSE SHOULDER ARTHROPLASTY;  Surgeon:  Leim Fabry, MD;  Location: [facility] ORS;  Service: Orthopedics;  Laterality: Left;  . ROBOTIC ASSISTED LAPAROSCOPIC REPAIR OF PARAESOPHAGEAL HERNIA N/A 03/09/2018   Procedure: ROBOTIC HIATAL HERNIA CONVERTED TO OPEN;  Surgeon: Jules Husbands, MD;  Location: [facility]  ORS;  Service: General;  Laterality: N/A;    Family History  Problem Relation Age of Onset  . Anxiety disorder Brother   . Obesity Brother   . COPD Brother   . Kidney disease Mother   . Heart attack Father   . Hypertension Father   . Alcohol abuse Father   . Depression Brother   . Anxiety disorder Brother   . Breast cancer Maternal Grandmother     Social History:  reports that she has quit smoking. Her smoking use included cigarettes. She started smoking about 8 years ago. She has a 2.50 pack-year smoking history. She has never used smokeless tobacco. She reports that she does not drink alcohol or use drugs.  Allergies: No Known Allergies  Medications reviewed.    ROS Full ROS performed and is otherwise negative other than what is stated in HPI   BP 91/67   Pulse 99   Temp 97.8 F (36.6 C) (Skin)   Ht 5\' 5"  (1.651 m)   Wt 117 lb (53.1 kg)   LMP  (LMP Unknown)   SpO2 96%   BMI 19.47 kg/m   Physical Exam Vitals signs and nursing note reviewed. Exam conducted with a chaperone present.  Constitutional:      Appearance: Normal appearance.  Pulmonary:     Effort: Pulmonary effort is normal. No respiratory distress.     Breath sounds: Normal breath sounds. No stridor.  Abdominal:     General: Abdomen is flat. There is no distension.     Palpations: Abdomen is soft. There is no mass.     Tenderness: There is no rebound.     Hernia: No hernia is present.     Comments: G tube inspected. I changed her dressing. No infection. I flushed it and is working well.  Musculoskeletal: Normal range of motion.        General: No swelling.  Skin:    General: Skin is warm and dry.     Capillary Refill: Capillary refill takes less than 2 seconds.     Coloration: Skin is not jaundiced.     Comments: Decubitus ulcer resolved. I do not see any ulceration on sacrum.  Neurological:     General: No focal deficit present.     Mental Status: She is alert and oriented to person, place,  and time.  Psychiatric:        Thought Content: Thought content normal.        Judgment: Judgment normal.     Comments: depressed         Assessment/Plan: S/p repair paraesophageal hernia w fundoplication and gastrostomy tube placement.  Main issue currently is social and psychiatric. She lives with her son but unfortunately he does have a substance abuse problem and is not really present and is unable to help.  She does have significant cognitive deficit that impairs her ability to feed herself.  She now has severe depression with some suicidal ideation.  Discussed with the patient in detail I do think that she might benefit from inpatient psychiatric hospitalization.  I have made referral as soon as possible to Dr. Gretel Acre, she was seen by Dr. Leverne Humbles during her recent hospitalization and recommended close follow up. We made an appt  for her tomorrow. At this time she does not sem to be an imminent risks to herself and others. I will also repeat a CXR and labs to make sure her condition has not worsened. Currently she is not toxic or Specific and no emergent intervention is required. I again taught her and her sister about G tube use. It is completely functional. I will increase her gabapentin dose and do liquid form so she can use it via G tube. I will see her back in 2 days and will call with results.  Caroleen Hamman, MD [facility] General Surgeon

## 2018-04-10 NOTE — Patient Instructions (Addendum)
Return in 2 days . Use miralax two times daily.   Dr Gena Fray [facility] 04/11/18 at 1:00 pm  Get labs and a chest xray today at [facility]   We have called in a medication for you at your pharmacy. Gabapentin 500mg  TID for 30 days.

## 2018-04-11 ENCOUNTER — Other Ambulatory Visit: Payer: Self-pay

## 2018-04-11 ENCOUNTER — Emergency Department
Admission: EM | Admit: 2018-04-11 | Discharge: 2018-04-12 | Disposition: A | Discharge status: Home / Self Care | Payer: Federal, State, Local not specified - PPO | Attending: Emergency Medicine | Admitting: Emergency Medicine

## 2018-04-11 ENCOUNTER — Telehealth: Payer: Self-pay | Admitting: *Deleted

## 2018-04-11 ENCOUNTER — Encounter: Payer: Self-pay | Admitting: Psychiatry

## 2018-04-11 ENCOUNTER — Ambulatory Visit (INDEPENDENT_AMBULATORY_CARE_PROVIDER_SITE_OTHER): Payer: Federal, State, Local not specified - PPO | Admitting: Psychiatry

## 2018-04-11 ENCOUNTER — Encounter: Payer: Self-pay | Admitting: Emergency Medicine

## 2018-04-11 VITALS — BP 91/63 | HR 86 | Temp 97.5°F | Wt 117.8 lb

## 2018-04-11 DIAGNOSIS — F331 Major depressive disorder, recurrent, moderate: Secondary | ICD-10-CM | POA: Diagnosis not present

## 2018-04-11 DIAGNOSIS — F329 Major depressive disorder, single episode, unspecified: Secondary | ICD-10-CM

## 2018-04-11 DIAGNOSIS — R45851 Suicidal ideations: Secondary | ICD-10-CM

## 2018-04-11 DIAGNOSIS — Z853 Personal history of malignant neoplasm of breast: Secondary | ICD-10-CM | POA: Insufficient documentation

## 2018-04-11 DIAGNOSIS — E43 Unspecified severe protein-calorie malnutrition: Secondary | ICD-10-CM

## 2018-04-11 DIAGNOSIS — Z9049 Acquired absence of other specified parts of digestive tract: Secondary | ICD-10-CM | POA: Insufficient documentation

## 2018-04-11 DIAGNOSIS — Z87891 Personal history of nicotine dependence: Secondary | ICD-10-CM | POA: Insufficient documentation

## 2018-04-11 DIAGNOSIS — F332 Major depressive disorder, recurrent severe without psychotic features: Secondary | ICD-10-CM | POA: Diagnosis not present

## 2018-04-11 DIAGNOSIS — Z79899 Other long term (current) drug therapy: Secondary | ICD-10-CM | POA: Insufficient documentation

## 2018-04-11 DIAGNOSIS — E860 Dehydration: Secondary | ICD-10-CM

## 2018-04-11 DIAGNOSIS — F99 Mental disorder, not otherwise specified: Secondary | ICD-10-CM | POA: Diagnosis present

## 2018-04-11 DIAGNOSIS — R4589 Other symptoms and signs involving emotional state: Secondary | ICD-10-CM | POA: Insufficient documentation

## 2018-04-11 LAB — CBC
HCT: 40.8 % (ref 36.0–46.0)
Hemoglobin: 13.2 g/dL (ref 12.0–15.0)
MCH: 30.3 pg (ref 26.0–34.0)
MCHC: 32.4 g/dL (ref 30.0–36.0)
MCV: 93.6 fL (ref 80.0–100.0)
Platelets: 623 10*3/uL — ABNORMAL HIGH (ref 150–400)
RBC: 4.36 MIL/uL (ref 3.87–5.11)
RDW: 12.4 % (ref 11.5–15.5)
WBC: 10.3 10*3/uL (ref 4.0–10.5)
nRBC: 0 % (ref 0.0–0.2)

## 2018-04-11 LAB — COMPREHENSIVE METABOLIC PANEL
ALT: 84 U/L — ABNORMAL HIGH (ref 0–44)
AST: 34 U/L (ref 15–41)
Albumin: 3.6 g/dL (ref 3.5–5.0)
Alkaline Phosphatase: 430 U/L — ABNORMAL HIGH (ref 38–126)
Anion gap: 14 (ref 5–15)
BUN: 16 mg/dL (ref 8–23)
CO2: 25 mmol/L (ref 22–32)
Calcium: 9.7 mg/dL (ref 8.9–10.3)
Chloride: 96 mmol/L — ABNORMAL LOW (ref 98–111)
Creatinine, Ser: 0.61 mg/dL (ref 0.44–1.00)
GFR calc non Af Amer: >60 mL/min (ref >60)
Glucose, Bld: 112 mg/dL — ABNORMAL HIGH (ref 70–99)
Potassium: 4.6 mmol/L (ref 3.5–5.1)
SODIUM: 135 mmol/L (ref 135–145)
Total Bilirubin: 0.7 mg/dL (ref 0.3–1.2)
Total Protein: 8.4 g/dL — ABNORMAL HIGH (ref 6.5–8.1)

## 2018-04-11 LAB — URINE DRUG SCREEN, QUALITATIVE (ARMC ONLY)
Amphetamines, Ur Screen: NOT DETECTED
Barbiturates, Ur Screen: NOT DETECTED
Benzodiazepine, Ur Scrn: NOT DETECTED
Cannabinoid 50 Ng, Ur ~~LOC~~: NOT DETECTED
Cocaine Metabolite,Ur ~~LOC~~: NOT DETECTED
MDMA (ECSTASY) UR SCREEN: NOT DETECTED
Methadone Scn, Ur: NOT DETECTED
Opiate, Ur Screen: POSITIVE — AB
Phencyclidine (PCP) Ur S: NOT DETECTED
Tricyclic, Ur Screen: NOT DETECTED

## 2018-04-11 LAB — ETHANOL: Alcohol, Ethyl (B): <10 mg/dL (ref <10)

## 2018-04-11 LAB — SALICYLATE LEVEL

## 2018-04-11 LAB — ACETAMINOPHEN LEVEL: Acetaminophen (Tylenol), Serum: <10 ug/mL — ABNORMAL LOW (ref 10–30)

## 2018-04-11 MED ORDER — TRAZODONE HCL 100 MG PO TABS
100.0000 mg | ORAL_TABLET | Freq: Every day | ORAL | Status: DC
  Administered 2018-04-11: 100 mg via ORAL
  Filled 2018-04-11: qty 1

## 2018-04-11 MED ORDER — CYCLOBENZAPRINE HCL 10 MG PO TABS: 5.0000 mg | ORAL_TABLET | Freq: Three times a day (TID) | ORAL | Status: DC | PRN

## 2018-04-11 MED ORDER — PANTOPRAZOLE SODIUM 40 MG PO TBEC
40.0000 mg | DELAYED_RELEASE_TABLET | Freq: Every day | ORAL | Status: DC
  Administered 2018-04-11 – 2018-04-12 (×2): 40 mg via ORAL
  Filled 2018-04-11 (×2): qty 1

## 2018-04-11 MED ORDER — PAROXETINE HCL 20 MG PO TABS
20.0000 mg | ORAL_TABLET | Freq: Every day | ORAL | Status: DC
  Filled 2018-04-11 (×2): qty 1

## 2018-04-11 MED ORDER — GABAPENTIN 300 MG PO CAPS
300.0000 mg | ORAL_CAPSULE | Freq: Three times a day (TID) | ORAL | Status: DC
  Administered 2018-04-11 – 2018-04-12 (×2): 300 mg via ORAL
  Filled 2018-04-11 (×2): qty 1

## 2018-04-11 MED ORDER — BUSPIRONE HCL 5 MG PO TABS
10.0000 mg | ORAL_TABLET | Freq: Two times a day (BID) | ORAL | Status: DC
  Administered 2018-04-11 – 2018-04-12 (×3): 10 mg via ORAL
  Filled 2018-04-11 (×3): qty 2

## 2018-04-11 MED ORDER — SIMVASTATIN 10 MG PO TABS
20.0000 mg | ORAL_TABLET | Freq: Every day | ORAL | Status: DC
  Administered 2018-04-11: 20 mg via ORAL
  Filled 2018-04-11: qty 2

## 2018-04-11 MED ORDER — OSMOLITE 1.5 CAL PO LIQD
237.0000 mL | Freq: Four times a day (QID) | ORAL | Status: DC
  Administered 2018-04-11 – 2018-04-12 (×3): 237 mL

## 2018-04-11 MED ORDER — ADULT MULTIVITAMIN W/MINERALS CH
1.0000 | ORAL_TABLET | Freq: Every day | ORAL | Status: DC
  Administered 2018-04-12: 1 via ORAL
  Filled 2018-04-11: qty 1

## 2018-04-11 MED ORDER — IBUPROFEN 400 MG PO TABS
200.0000 mg | ORAL_TABLET | Freq: Every day | ORAL | Status: DC | PRN
  Administered 2018-04-12: 400 mg via ORAL
  Filled 2018-04-11: qty 1

## 2018-04-11 NOTE — Progress Notes (Signed)
[place] MD OP Progress Note  04/11/2018 5:39 PM Chelsey [name]  MRN:  [id number]  Chief Complaint: ' I am here for follow up." Chief Complaint    Follow-up; Medication Refill     HPI: Chelsey [name] is a 68 year old Caucasian female, who lives in [place] with her son, has a history of depression, recent gastroesophageal hernia surgery.  Patient was seen at her surgery office yesterday where she reported suicidal ideation and was referred to the outpatient clinic.  Patient today presented along with her Sister Chelsey [name] who provided collateral information.  Patient reports she is tired of having to feed herself which she is unable to do herself.  She reports she is in pain all the time from the surgery.  She reports her son is not home and works.  She reports her Sister Chelsey [name] also has to go for work and cannot be with her all the time.  Hence she has not been feeding herself well and feels tired and fatigued all the time.  Patient reports she hence is very depressed.  Patient reported she would like to kill herself if possible.  Patient was recently seen by consult team Dr. Melba Coon - ' for worsening depression and anxiety status post Gastroesophageal hernia.  Patient was provided supportive therapy,.  Patient was advised to follow-up with psychiatrist on an outpatient basis as soon as possible.  Patient was evaluated and was seen as safe to be discharged to the care of her sister.'  Patient used to see Dr. Gretel Acre here in clinic-most recent visit was on 03/06/2018.  Dr. Anola Gurney progress note on 03/06/2018- diagnosed with MDD- patient continued on Paxil 20 mg, trazodone 100 mg and BuSpar 10 mg twice a day.'  Collateral information was obtained from [place] -who reports that they are trying to get her into an assisted living facility or skilled nursing facility but that is all going to take a long time.  She reports her sister seems to be depressed and she cannot be with her all the time to support her through  this.  Discussed with patient as well as sister to go to the emergency department to be evaluated for possible inpatient psychiatric admission.  Also have reviewed medical records in [facility] per provider Diego Pabon -dated 04/10/2018- '' Pt with significant cognitive deficit that impairs her ability to feed herself.  She now has severe depression with some suicidal ideation.  Discussed with patient in detail I do think that she might benefit from inpatient psychiatric hospitalization.'        Visit Diagnosis:    ICD-10-CM   1. MDD (major depressive disorder), recurrent episode, moderate ([place]) F33.1     Past Psychiatric History: Patient with history of inpatient mental health admission at [facility] in 2009.  Patient has a history of being treated with mirtazapine, Celexa, Paxil, Seroquel, Ativan.  Patient was treated by Dr. Gretel Acre on an outpatient basis and last visit was on 03/06/2018.  Past Medical History:  Past Medical History:  Diagnosis Date  . Breast cancer ([place]) 1997   right breast, radiation  . Bronchitis    recent/ 06/09/15 had chest xray/ [facility]/resolved  . Cancer [facility]) 1997   right lumpectomy,L/Ad/R   . GERD (gastroesophageal reflux disease)   . History of hiatal hernia   . Hypercholesteremia   . Hyperlipidemia   . Low BP    TYPICALLY RUNS 80'S/60'S  . Panic attack   . Personal history of malignant neoplasm  of breast     Past Surgical History:  Procedure Laterality Date  . BICEPT TENODESIS Left 11/23/2016   Procedure: BICEPS TENODESIS;  Surgeon: Leim Fabry, MD;  Location: [facility] ORS;  Service: Orthopedics;  Laterality: Left;  . BREAST EXCISIONAL BIOPSY Right 1997   pos  . BREAST SURGERY Right 1997   lumpectomy  . CHOLECYSTECTOMY N/A 08/06/2016   Procedure: LAPAROSCOPIC CHOLECYSTECTOMY WITH INTRAOPERATIVE CHOLANGIOGRAM;  Surgeon: Christene Lye, MD;  Location: [facility] ORS;  Service: General;  Laterality: N/A;  . COLONOSCOPY   2008  . COLONOSCOPY WITH PROPOFOL N/A 07/21/2015   Procedure: COLONOSCOPY WITH PROPOFOL;  Surgeon: Lucilla Lame, MD;  Location: [place];  Service: Endoscopy;  Laterality: N/A;  . COLONOSCOPY WITH PROPOFOL N/A 01/05/2018   Procedure: COLONOSCOPY WITH PROPOFOL;  Surgeon: Jonathon Bellows, MD;  Location: [facility] ENDOSCOPY;  Service: Gastroenterology;  Laterality: N/A;  . ESOPHAGOGASTRODUODENOSCOPY (EGD) WITH PROPOFOL N/A 06/16/2016   Procedure: ESOPHAGOGASTRODUODENOSCOPY (EGD) WITH PROPOFOL;  Surgeon: Christene Lye, MD;  Location: [facility] ENDOSCOPY;  Service: Endoscopy;  Laterality: N/A;  . ESOPHAGOGASTRODUODENOSCOPY (EGD) WITH PROPOFOL N/A 01/05/2018   Procedure: ESOPHAGOGASTRODUODENOSCOPY (EGD) WITH PROPOFOL;  Surgeon: Jonathon Bellows, MD;  Location: [facility] ENDOSCOPY;  Service: Gastroenterology;  Laterality: N/A;  . HARDWARE REMOVAL Left 12/27/2016   Procedure: HARDWARE REMOVAL LEFT  PROXIMAL HUMEROUS;  Surgeon: Leim Fabry, MD;  Location: [facility] ORS;  Service: Orthopedics;  Laterality: Left;  . NISSEN FUNDOPLICATION N/A 6/76/7209   Procedure: NISSEN FUNDOPLICATION;  Surgeon: Jules Husbands, MD;  Location: [facility] ORS;  Service: General;  Laterality: N/A;  . ORIF HUMERUS FRACTURE Left 11/23/2016   Procedure: OPEN REDUCTION INTERNAL FIXATION (ORIF) PROXIMAL HUMERUS FRACTURE;  Surgeon: Leim Fabry, MD;  Location: [facility] ORS;  Service: Orthopedics;  Laterality: Left;  . REPAIR OF ESOPHAGUS  03/09/2018   Procedure: REPAIR OF ESOPHAGUS;  Surgeon: Jules Husbands, MD;  Location: [facility] ORS;  Service: General;;  . REVERSE SHOULDER ARTHROPLASTY Left 12/27/2016   Procedure: REVERSE SHOULDER ARTHROPLASTY;  Surgeon: Leim Fabry, MD;  Location: [facility] ORS;  Service: Orthopedics;  Laterality: Left;  . ROBOTIC ASSISTED LAPAROSCOPIC REPAIR OF PARAESOPHAGEAL HERNIA N/A 03/09/2018   Procedure: ROBOTIC HIATAL HERNIA CONVERTED TO OPEN;  Surgeon: Jules Husbands, MD;  Location: [facility] ORS;  Service: General;  Laterality: N/A;     Family Psychiatric History: As noted below.  Family History:  Family History  Problem Relation Age of Onset  . Anxiety disorder Brother   . Obesity Brother   . COPD Brother   . Kidney disease Mother   . Heart attack Father   . Hypertension Father   . Alcohol abuse Father   . Depression Brother   . Anxiety disorder Brother   . Breast cancer Maternal Grandmother     Social History: Currently lives with her son in [place].  Her sister helps out. Social History   Socioeconomic History  . Marital status: Widowed    Spouse name: Not on file  . Number of children: 2  . Years of education: Not on file  . Highest education level: Bachelor's degree (e.g., BA, AB, BS)  Occupational History  . Not on file  Social Needs  . Financial resource strain: Hard  . Food insecurity:    Worry: Never true    Inability: Never true  . Transportation needs:    Medical: No    Non-medical: No  Tobacco Use  . Smoking status: Former Smoker    Packs/day: 0.25    Years: 10.00  Pack years: 2.50    Types: Cigarettes    Start date: 11/25/2009  . Smokeless tobacco: Never Used  . Tobacco comment: 1 cigarette daily  Substance and Sexual Activity  . Alcohol use: No    Alcohol/week: 0.0 standard drinks  . Drug use: No  . Sexual activity: Never  Lifestyle  . Physical activity:    Days per week: 0 days    Minutes per session: 0 min  . Stress: Very much  Relationships  . Social connections:    Talks on phone: Not on file    Gets together: Not on file    Attends religious service: Never    Active member of club or organization: No    Attends meetings of clubs or organizations: Never    Relationship status: Widowed  Other Topics Concern  . Not on file  Social History Narrative  . Not on file    Allergies: No Known Allergies  Metabolic Disorder Labs: No results found for: HGBA1C, MPG No results found for: PROLACTIN Lab Results  Component Value Date   CHOL 162 07/27/2017   TRIG 93  07/27/2017   HDL 56 07/27/2017   VLDL 15 01/05/2016   LDLCALC 87 07/27/2017   LDLCALC 89 07/05/2016   Lab Results  Component Value Date   TSH 2.530 07/27/2017   TSH 2.217 02/17/2017    Therapeutic Level Labs: No results found for: LITHIUM No results found for: VALPROATE No components found for:  CBMZ  Current Medications: No current facility-administered medications for this visit.    Current Outpatient Medications  Medication Sig Dispense Refill  . busPIRone (BUSPAR) 10 MG tablet Take 1 tablet (10 mg total) by mouth 2 (two) times daily. 180 tablet 1  . Calcium Carbonate-Vitamin D (CALCIUM + D PO) Take 1 tablet daily by mouth.     . Cholecalciferol (VITAMIN D) 2000 units tablet Take 2,000 Units by mouth daily.    . cyclobenzaprine (FLEXERIL) 5 MG tablet Take 1 tablet (5 mg total) by mouth 3 (three) times daily as needed for muscle spasms. 30 tablet 0  . gabapentin (NEURONTIN) 300 MG capsule Take 1 capsule (300 mg total) by mouth 3 (three) times daily. 60 capsule 0  . HYDROcodone-acetaminophen (HYCET) 7.5-325 mg/15 ml solution Take 15 mLs by mouth 4 (four) times daily as needed for up to 30 doses for moderate pain. 120 mL 0  . ibuprofen (ADVIL,MOTRIN) 200 MG tablet Take 200-400 mg by mouth daily as needed for headache or moderate pain.    . Multiple Vitamin (MULTIVITAMIN) tablet Take 1 tablet daily by mouth.     . Nutritional Supplements (FEEDING SUPPLEMENT, OSMOLITE 1.5 CAL,) LIQD Place 237 mLs into feeding tube 4 (four) times daily. 120 Bottle 0  . omeprazole (PRILOSEC) 20 MG capsule Take 1 capsule (20 mg total) by mouth 2 (two) times daily before a meal. 180 capsule 3  . PARoxetine (PAXIL) 20 MG tablet Take 1 tablet (20 mg total) by mouth daily before supper. 90 tablet 1  . Peppermint Oil (IBGARD PO) Take 2 tablets by mouth daily before supper.    . Simethicone (GAS-X PO) Take 1-2 tablets by mouth daily as needed (gas).    . simvastatin (ZOCOR) 20 MG tablet TAKE ONE TABLET BY  MOUTH AT BEDTIME 90 tablet 1  . traZODone (DESYREL) 100 MG tablet Take 1 tablet (100 mg total) by mouth at bedtime. 90 tablet 2   Facility-Administered Medications Ordered in Other Visits  Medication Dose Route Frequency Provider  Last Rate Last Dose  . busPIRone (BUSPAR) tablet 10 mg  10 mg Oral BID Alfred Levins, [place], MD   10 mg at 04/11/18 1603  . feeding supplement (OSMOLITE 1.5 CAL) liquid 237 mL  237 mL Per Tube QID Alfred Levins, [place], MD      . pantoprazole (PROTONIX) EC tablet 40 mg  40 mg Oral Daily Alfred Levins, [place], MD   40 mg at 04/11/18 1603  . PARoxetine (PAXIL) tablet 20 mg  20 mg Oral QAC supper Alfred Levins, [place], MD      . simvastatin (ZOCOR) tablet 20 mg  20 mg Oral QHS Alfred Levins, [place], MD      . traZODone (DESYREL) tablet 100 mg  100 mg Oral QHS Rudene Re, MD         Musculoskeletal: Strength & Muscle Tone: within normal limits Gait & Station: normal Patient leans: N/A  Psychiatric Specialty Exam: Review of Systems  Psychiatric/Behavioral: Positive for depression and suicidal ideas. The patient is nervous/anxious.   All other systems reviewed and are negative.   Blood pressure 91/63, pulse 86, temperature (!) 97.5 F (36.4 C), temperature source Oral, weight 117 lb 12.8 oz (53.4 kg).Body mass index is 19.6 kg/m.  General Appearance: Guarded  Eye Contact:  Minimal  Speech:  Normal Rate  Volume:  Decreased  Mood:  Depressed  Affect:  Depressed  Thought Process:  Goal Directed and Descriptions of Associations: Intact  Orientation:  Other:  Person, place and situation  Thought Content: Logical   Suicidal Thoughts:  Yes.  without intent/plan  Homicidal Thoughts:  No  Memory:  Immediate;   Fair Recent;   Fair Remote;   Fair  Judgement:  Fair  Insight:  Fair  Psychomotor Activity:  Decreased  Concentration:  Concentration: Fair and Attention Span: Fair  Recall:  [employer] of Knowledge: Fair  Language: Fair  Akathisia:  No  Handed:  Right   AIMS (if indicated):na  Assets:  Communication Skills Social Support  ADL's:  Intact  Cognition: WNL  Sleep:  Fair   Screenings: PHQ2-9     Office Visit from 07/27/2017 in [facility] Office Visit from 08/17/2016 in [facility] Visit from 07/05/2016 in [facility]  PHQ-2 Total Score  2  1  0  PHQ-9 Total Score  6  4  0       Assessment and Plan: Patient is a 68 year old Caucasian female who has a history of depression as well as recent gastroesophageal hernia surgery has a G-tube feeding place, presented to the clinic today referred by her surgeon yesterday for voicing some suicidal thoughts.  Patient today continues to appear depressed and continues to make suicidal comments.  Her sister who presented along with her provided collateral information and reports patient has not been feeding herself well and is unable to do so and needs assistance.  Her sister or her son cannot be with her all the time since they work.  Because of patient's suicidal comments we will send her to the emergency department.  Janett Billow CMA to escort her.  I have reviewed medical records from Dr. Gretel Acre, her surgeon Dr.Pabon as well as Dr.Maurer as summarized above.  I have spent atleast 15 minutes face to face with patient today. More than 50 % of the time was spent for psychoeducation and supportive psychotherapy and care coordination.  This note was generated in part or whole with voice recognition software. Voice recognition is usually quite accurate but there are transcription errors  that can and very often do occur. I apologize for any typographical errors that were not detected and corrected.        Ursula Alert, MD 04/11/2018, 5:39 PM

## 2018-04-11 NOTE — ED Notes (Signed)
BEHAVIORAL HEALTH ROUNDING Patient sleeping: No. Patient alert and oriented: yes Behavior appropriate: Yes.  ; If no, describe:  Nutrition and fluids offered: yes Toileting and hygiene offered: Yes  Sitter present: q15 minute observations and security  monitoring Law enforcement present: Yes  ODS  

## 2018-04-11 NOTE — ED Notes (Signed)
Hourly rounding reveals patient in room. No complaints, stable, in no acute distress. Q15 minute rounds and monitoring via Rover and Officer to continue.   

## 2018-04-11 NOTE — ED Notes (Signed)
Pt dressed out into burgundy scrubs per this RN and Kathlee Nations, Therapist, sports.  Belongings placed into labeled bag, handed off to pt's sister, Florestine Avers.  Belongings included:  Runner, broadcasting/film/video (black) tennis shoes Socks (black) Jogging pants (teal) Underwear (purple) [employer] Horticulturist, commercial) Purse (black) Jacket (black)

## 2018-04-11 NOTE — Telephone Encounter (Signed)
Notified patient as instructed, patient pleased. Discussed follow-up appointments, patient agrees  

## 2018-04-11 NOTE — Telephone Encounter (Signed)
-----   Message from Jules Husbands, MD sent at 04/11/2018  7:21 AM EDT ----- Please Tell the pt that she has mild elevation of liver enzymes, not sure why. She will need a referral to GI, please arrange GI eval. Other labs including hb and wbc are nml. ----- Message ----- From: Interface, Lab In [facility] Sent: 04/10/2018   5:11 PM EDT To: Jules Husbands, MD

## 2018-04-11 NOTE — Telephone Encounter (Signed)
Notified patient as instructed, patient pleased. Discussed follow-up appointments, referral placed to Dr Vicente Males, patient agrees

## 2018-04-11 NOTE — Telephone Encounter (Signed)
Notified patient as instructed, patient pleased. Discussed follow-up appointments, will arrange for pt to see Dr Vicente Males( she has seen before) patient agrees

## 2018-04-11 NOTE — ED Triage Notes (Signed)
Pt in via POV, states "I went to see the psychiatrist and they sent me over here."  Pt states, "I wasn't dealing good with this surgery.  Everybody is busy, it would just be easier on my family if I die."  Ambulatory to triage.  Vitals WDL.

## 2018-04-11 NOTE — ED Notes (Signed)
Pt with recently placed Gtube, states she is supposed to be be doing tube feeds 3-4x/day, states she is unable to do them herself.  Pt's sister helps her but is unable to be available to do multiple feeds/day.

## 2018-04-11 NOTE — Clinical Social Work Note (Signed)
CSW consulted for "[facility] ed". CSW reviewed chart. Per chart review patient has had multiple admissions and is currently having suicidal ideations. CSW will monitor for needs. Patient needs to be evaluated by Psych before CSW can proceed.   Caroline, [facility]

## 2018-04-11 NOTE — Telephone Encounter (Signed)
Patient called and picked up her medication yesterday gabapentin and she doesn't understand what she needs to do, she stated that she got 2 bottles of liquid.

## 2018-04-11 NOTE — Consult Note (Signed)
[facility] Consult   Reason for Consult: Suicidal Ideation Referring Physician:   Dr. Alfred Levins Patient Identification: Chelsey [name] MRN:  [id number] Principal Diagnosis: Major depressive disorder Diagnosis:  Major depressive disorder   Total Time spent with patient: 1 hour  Subjective:   Chelsey [name] is a 68 y.o. female patient presented to [facility] ED voluntarily. The patient was seen face-to-face by this provider; chart reviewed and consulted with Dr. Archie Balboa and Dr. Marisue Ivan on 04/11/2018 due to the care of the patient.  It was discussed with both providers that the patient does not meet criteria to be admitted to the inpatient unit. She was seen for voicing suicidal ideation. The patient have had some medical problems and surgeries that has left her feeling severely depressed due to the outcome of her surgeries.  The patient underwent a hiatal hernia repair in 0/7371 complicated by esophageal perforation currently on osmolite supplementation QID.  On evaluation the patient is alert and oriented x4, calm and cooperative, and mood-congruent with affect.  The patient does not appear to be responding to internal or external stimuli. Neither is the patient presenting with any delusional thinking. The patient denies any suicidal, homicidal, or self-harm ideations. " Even if I wanted to kill myself ; I can't even do it. I am not able to swallow more than 1 pill at a time." The patient is a low risk for suicidal or self-harm. The patient expressed that her biggest problem is caring for herself. She also states she feels she is  liability to her sister. She expressed that her sister has to care for her along with the sister working full-time.  The patient is not presenting with any psychotic or paranoid behaviors. During an encounter with the patient, she was able to answer questions appropriately.  Collateral was obtained by patient sister Florestine Avers [phone]) who expresses  concerns for patient's suicidal thoughts. The patient sister denies that patient has history of suicide attempts or self-harm.  Ms. Laurann Montana does agree that her sister is feeling overwhelmed and depressed with her overall care and how her surgery turned out. She voiced that her sister believed that post surgery she would returned to being "herself again". Her situation currently is difficult for her to understand and deal with it. She is hoping that she can get some help with caring for herself. Discussed the patient last hospitalization. The patient was seen by Dr. Leverne Humbles on a consult basis prior to her being discharge post surgery. The patient was encourage to schedule a psychotherapy visit with a therapist. Which was discussed on this encounter with the patient and her sister. TTS Counselor was asked to link patient up with services that could assist her with getting assistance that will help her with ADLs and managing her other care needs. Plan: This provider discussed plan of care  with the patient's sister who has also been advised that should she become concerned of patient having suicidal ideation with plan or intent. The patient should be brought to the nearest emergency department or call 911 in order for patient to be reevaluated by psychiatry.   HPI:  Chelsey [name] is a 68 y.o. female with a history of depression who presents for psychiatric evaluation.  Patient underwent a hiatal hernia repair in 02/7033 complicated by esophageal perforation currently on osmolite supplementation QID.  Patient went to see her psychiatrist today and told them that she is feeling severely depressed for several weeks.  She feels like she is  a burden to her family.  She told them that she wish she was no longer alive.  She denies having any active suicidal plan. She reports that she has been struggling with all the complications from her surgery. She denies abdominal pain, vomiting, diarrhea, fever or chills. She denies  any prior h/o suicide attempt.   Past Psychiatric History:  Major depressive disorder (MDD) Panic attack  Risk to Self:  No Risk to Others:  No Prior Inpatient Therapy:  No Prior Outpatient Therapy:  Yes  Past Medical History:  Past Medical History:  Diagnosis Date  . Breast cancer ([place]) 1997   right breast, radiation  . Bronchitis    recent/ 06/09/15 had chest xray/ [facility]/resolved  . Cancer [facility]) 1997   right lumpectomy,L/Ad/R   . GERD (gastroesophageal reflux disease)   . History of hiatal hernia   . Hypercholesteremia   . Hyperlipidemia   . Low BP    TYPICALLY RUNS 80'S/60'S  . Panic attack   . Personal history of malignant neoplasm of breast     Past Surgical History:  Procedure Laterality Date  . BICEPT TENODESIS Left 11/23/2016   Procedure: BICEPS TENODESIS;  Surgeon: Leim Fabry, MD;  Location: [facility] ORS;  Service: Orthopedics;  Laterality: Left;  . BREAST EXCISIONAL BIOPSY Right 1997   pos  . BREAST SURGERY Right 1997   lumpectomy  . CHOLECYSTECTOMY N/A 08/06/2016   Procedure: LAPAROSCOPIC CHOLECYSTECTOMY WITH INTRAOPERATIVE CHOLANGIOGRAM;  Surgeon: Christene Lye, MD;  Location: [facility] ORS;  Service: General;  Laterality: N/A;  . COLONOSCOPY  2008  . COLONOSCOPY WITH PROPOFOL N/A 07/21/2015   Procedure: COLONOSCOPY WITH PROPOFOL;  Surgeon: Lucilla Lame, MD;  Location: [facility];  Service: Endoscopy;  Laterality: N/A;  . COLONOSCOPY WITH PROPOFOL N/A 01/05/2018   Procedure: COLONOSCOPY WITH PROPOFOL;  Surgeon: Jonathon Bellows, MD;  Location[facility] END[facility]COPY;  Service: Gastroenterology;  Laterality: N/A;  . ESOPHAGOGASTRODUODENOSCOPY (EGD) WITH PROPOFOL N/A 06/16/2016   Procedure: ESOPHAGOGASTRODUODENOSCOPY (EGD) WITH PROPOFOL;  Surgeon: Christene Lye, MD;  Location: [facility] ENDOSCOPY;  Service: Endoscopy;  Laterality: N/A;  . ESOPHAGOGASTRODUODENOSCOPY (EGD) WITH PROPOFOL N/A 01/05/2018   Procedure: ESOPHAGOGASTRODUODENOSCOPY (EGD) WITH  PROPOFOL;  Surgeon: Jonathon Bellows, MD;  Location: [facility] ENDOSCOPY;  Service: Gastroenterology;  Laterality: N/A;  . HARDWARE REMOVAL Left 12/27/2016   Procedure: HARDWARE REMOVAL LEFT  PROXIMAL HUMEROUS;  Surgeon: Leim Fabry, MD;  Location: [facility]S;  Service: Orthopedics;  Laterality: Left;  . NISSEN FUNDOPLICATION N/A 6/75/9163   Procedure: NISSEN FUNDOPLICATION;  Surgeon: Jules Husbands, MD;  Location: [facility];  Service: General;  Laterality: N/A;  . ORIF HUMERUS FRACTURE Left 11/23/2016   Procedure: OPEN REDUCTION INTERNAL FIXATION (ORIF) PROXIMAL HUMERUS FRACTURE;  Surgeon: Leim Fabry, MD;  Location: [facility];  Service: Orthopedics;  Laterality: Left;  . REPAIR OF ESOPHAGUS  03/09/2018   Procedure: REPAIR OF ESOPHAGUS;  Surgeon: Jules Husbands, MD;  Location: [facility];  Service: General;;  . REVERSE SHOULDER ARTHROPLASTY Left 12/27/2016   Procedure: REVERSE SHOULDER ARTHROPLASTY;  Surgeon: Leim Fabry, MD;  Location: [facility];  Service: Orthopedics;  Laterality: Left;  . ROBOTIC ASSISTED LAPAROSCOPIC REPAIR OF PARAESOPHAGEAL HERNIA N/A 03/09/2018   Procedure: ROBOTIC HIATAL HERNIA CONVERTED TO OPEN;  Surgeon: Jules Husbands, MD;  Location: [facility] ORS;  Service: General;  Laterality: N/A;   Family History:  Family History  Problem Relation Age of Onset  . Anxiety disorder Brother   . Obesity Brother   . COPD Brother   .  Kidney disease Mother   . Heart attack Father   . Hypertension Father   . Alcohol abuse Father   . Depression Brother   . Anxiety disorder Brother   . Breast cancer Maternal Grandmother    Family Psychiatric  History:  None provided Social History:  Social History   Substance and Sexual Activity  Alcohol Use No  . Alcohol/week: 0.0 standard drinks     Social History   Substance and Sexual Activity  Drug Use No    Social History   Socioeconomic History  . Marital status: Widowed    Spouse name: Not on file  . Number of children: 2  . Years of education:  Not on file  . Highest education level: Bachelor's degree (e.g., BA, AB, BS)  Occupational History  . Not on file  Social Needs  . Financial resource strain: Hard  . Food insecurity:    Worry: Never true    Inability: Never true  . Transportation needs:    Medical: No    Non-medical: No  Tobacco Use  . Smoking status: Former Smoker    Packs/day: 0.25    Years: 10.00    Pack years: 2.50    Types: Cigarettes    Start date: 11/25/2009  . Smokeless tobacco: Never Used  . Tobacco comment: 1 cigarette daily  Substance and Sexual Activity  . Alcohol use: No    Alcohol/week: 0.0 standard drinks  . Drug use: No  . Sexual activity: Never  Lifestyle  . Physical activity:    Days per week: 0 days    Minutes per session: 0 min  . Stress: Very much  Relationships  . Social connections:    Talks on phone: Not on file    Gets together: Not on file    Attends religious service: Never    Active member of club or organization: No    Attends meetings of clubs or organizations: Never    Relationship status: Widowed  Other Topics Concern  . Not on file  Social History Narrative  . Not on file   Additional Social History:    Allergies:  No Known Allergies  Labs:  Results for orders placed or performed during the hospital encounter of 04/11/18 (from the past 48 hour(s))  Comprehensive metabolic panel     Status: Abnormal   Collection Time: 04/11/18  2:07 PM  Result Value Ref Range   Sodium 135 135 - 145 mmol/L   Potassium 4.6 3.5 - 5.1 mmol/L   Chloride 96 (L) 98 - 111 mmol/L   CO2 25 22 - 32 mmol/L   Glucose, Bld 112 (H) 70 - 99 mg/dL   BUN 16 8 - 23 mg/dL   Creatinine, Ser 0.61 0.44 - 1.00 mg/dL   Calcium 9.7 8.9 - 10.3 mg/dL   Total Protein 8.4 (H) 6.5 - 8.1 g/dL   Albumin 3.6 3.5 - 5.0 g/dL   AST 34 15 - 41 U/L   ALT 84 (H) 0 - 44 U/L   Alkaline Phosphatase 430 (H) 38 - 126 U/L   Total Bilirubin 0.7 0.3 - 1.2 mg/dL   GFR calc non Af Amer >60 >60 mL/min   GFR calc Af  Amer >60 >60 mL/min   Anion gap 14 5 - 15    Comment: Performed at [facility], [address] [place], [place] [address]  Ethanol     Status: None   Collection Time: 04/11/18  2:07 PM  Result Value Ref Range  Alcohol, Ethyl (B) <10 <10 mg/dL    Comment: (NOTE) Lowest detectable limit for serum alcohol is 10 mg/dL. For medical purposes only. Performed at [facility], [address] [place], [place] [address]   Salicylate level     Status: None   Collection Time: 04/11/18  2:07 PM  Result Value Ref Range   Salicylate Lvl <3.8 2.8 - 30.0 mg/dL    Comment: Performed at [facility], [place], [place] [address]  Acetaminophen level     Status: Abnormal   Collection Time: 04/11/18  2:07 PM  Result Value Ref Range   Acetaminophen (Tylenol), Serum <10 (L) 10 - 30 ug/mL    Comment: (NOTE) Therapeutic concentrations vary significantly. A range of 10-30 ug/mL  may be an effective concentration for many patients. However, some  are best treated at concentrations outside of this range. Acetaminophen concentrations >150 ug/mL at 4 hours after ingestion  and >50 ug/mL at 12 hours after ingestion are often associated with  toxic reactions. Performed at [facility], [address] [place], [place] [address]   cbc     Status: Abnormal   Collection Time: 04/11/18  2:07 PM  Result Value Ref Range   WBC 10.3 4.0 - 10.5 K/uL   RBC 4.36 3.87 - 5.11 MIL/uL   Hemoglobin 13.2 12.0 - 15.0 g/dL   HCT 40.8 36.0 - 46.0 %   MCV 93.6 80.0 - 100.0 fL   MCH 30.3 26.0 - 34.0 pg   MCHC 32.4 30.0 - 36.0 g/dL   RDW 12.4 11.5 - 15.5 %   Platelets 623 (H) 150 - 400 K/uL   nRBC 0.0 0.0 - 0.2 %    Comment: Performed at [facility], [address] [place],  [address]    Current Facility-Administered Medications  Medication Dose Route Frequency Provider Last Rate Last Dose  . busPIRone (BUSPAR) tablet 10 mg  10 mg Oral BID  Alfred Levins, [place], MD      . feeding supplement (OSMOLITE 1.5 CAL) liquid 237 mL  237 mL Per Tube QID Alfred Levins, [place], MD      . pantoprazole (PROTONIX) EC tablet 40 mg  40 mg Oral Daily Alfred Levins, [place], MD      . PARoxetine (PAXIL) tablet 20 mg  20 mg Oral QAC supper Alfred Levins, [place], MD      . simvastatin (ZOCOR) tablet 20 mg  20 mg Oral QHS Alfred Levins, [place], MD      . traZODone (DESYREL) tablet 100 mg  100 mg Oral QHS Rudene Re, MD       Current Outpatient Medications  Medication Sig Dispense Refill  . busPIRone (BUSPAR) 10 MG tablet Take 1 tablet (10 mg total) by mouth 2 (two) times daily. 180 tablet 1  . Calcium Carbonate-Vitamin D (CALCIUM + D PO) Take 1 tablet daily by mouth.     . Cholecalciferol (VITAMIN D) 2000 units tablet Take 2,000 Units by mouth daily.    . cyclobenzaprine (FLEXERIL) 5 MG tablet Take 1 tablet (5 mg total) by mouth 3 (three) times daily as needed for muscle spasms. 30 tablet 0  . gabapentin (NEURONTIN) 300 MG capsule Take 1 capsule (300 mg total) by mouth 3 (three) times daily. 60 capsule 0  . HYDROcodone-acetaminophen (HYCET) 7.5-325 mg/15 ml solution Take 15 mLs by mouth 4 (four) times daily as needed for up to 30 doses for moderate pain. 120 mL 0  . ibuprofen (ADVIL,MOTRIN) 200 MG tablet Take 200-400 mg  by mouth daily as needed for headache or moderate pain.    . Multiple Vitamin (MULTIVITAMIN) tablet Take 1 tablet daily by mouth.     . Nutritional Supplements (FEEDING SUPPLEMENT, OSMOLITE 1.5 CAL,) LIQD Place 237 mLs into feeding tube 4 (four) times daily. 120 Bottle 0  . omeprazole (PRILOSEC) 20 MG capsule Take 1 capsule (20 mg total) by mouth 2 (two) times daily before a meal. 180 capsule 3  . PARoxetine (PAXIL) 20 MG tablet Take 1 tablet (20 mg total) by mouth daily before supper. 90 tablet 1  . Peppermint Oil (IBGARD PO) Take 2 tablets by mouth daily before supper.    . Simethicone (GAS-X PO) Take 1-2 tablets by mouth daily as needed  (gas).    . simvastatin (ZOCOR) 20 MG tablet TAKE ONE TABLET BY MOUTH AT BEDTIME 90 tablet 1  . traZODone (DESYREL) 100 MG tablet Take 1 tablet (100 mg total) by mouth at bedtime. 90 tablet 2    Musculoskeletal: Strength & Muscle Tone: decreased Gait & Station: normal Patient leans: N/A  Psychiatric Specialty Exam: Physical Exam  Nursing note and vitals reviewed. Constitutional: She is oriented to person, place, and time. She appears well-developed and well-nourished.  HENT:  Head: Normocephalic and atraumatic.  Right Ear: External ear normal.  Left Ear: External ear normal.  Eyes: Pupils are equal, round, and reactive to light. Conjunctivae and EOM are normal.  Neck: Normal range of motion. Neck supple.  Cardiovascular: Normal rate and regular rhythm.  Respiratory: Effort normal and breath sounds normal.  Musculoskeletal: Normal range of motion.  Neurological: She is alert and oriented to person, place, and time. She has normal reflexes.  Skin: Skin is warm and dry.  Psychiatric: Her behavior is normal.    Review of Systems  Constitutional: Negative.   HENT: Negative.   Eyes: Negative.   Respiratory: Negative.   Cardiovascular: Negative.   Gastrointestinal: Positive for abdominal pain.  Genitourinary: Negative.   Musculoskeletal: Positive for joint pain.  Skin: Negative.   Neurological: Negative.   Psychiatric/Behavioral: Positive for depression. Negative for hallucinations, memory loss, substance abuse and suicidal ideas. The patient is nervous/anxious. The patient does not have insomnia.     Blood pressure 90/66, pulse 97, resp. rate 16, height 5\' 5"  (1.651 m), weight 53.1 kg, SpO2 98 %.Body mass index is 19.47 kg/m.  General Appearance: Fairly Groomed  Eye Contact:  Good  Speech:  Slow  Volume:  Decreased  Mood:  Depressed and Hopeless  Affect:  Depressed and Flat  Thought Process:  Goal Directed  Orientation:  Full (Time, Place, and Person)  Thought Content:   Logical  Suicidal Thoughts:  No  Homicidal Thoughts:  No  Memory:  Recent;   Good  Judgement:  Good  Insight:  Fair  Psychomotor Activity:  Decreased  Concentration:  Concentration: Fair  Recall:  [place] of Knowledge:  Good  Language:  Good  Akathisia:  NA  Handed:  Right  AIMS (if indicated):     Assets:  Desire for Improvement Social Support  ADL's:  Impaired  Cognition:  WNL  Sleep:   Well     Treatment Plan Summary: Medication management   Disposition: No evidence of imminent risk to self or others at present.   Patient does not meet criteria for psychiatric inpatient admission. Supportive therapy provided about ongoing stressors. Discussed crisis plan, support from social network, calling 911, coming to the Emergency Department, and calling Suicide Hotline.  Lamont Dowdy, NP  04/11/2018 4:03 PM

## 2018-04-11 NOTE — ED Notes (Signed)
Hourly rounding reveals patient sleeping in room. No complaints, stable, in no acute distress. Q15 minute rounds and monitoring via Rover and Officer to continue.  

## 2018-04-11 NOTE — ED Notes (Signed)
Report to include Situation, Background, Assessment, and Recommendations received from Amy Teague RN. Patient alert and oriented, warm and dry, in no acute distress. Patient denies SI, HI, AVH and pain. Patient made aware of Q15 minute rounds and Rover and Officer presence for their safety. Patient instructed to come to me with needs or concerns.  

## 2018-04-11 NOTE — ED Notes (Signed)

## 2018-04-11 NOTE — ED Provider Notes (Signed)
[facility] Emergency Department Provider Note  ____________________________________________  Time seen: Approximately 2:29 PM  I have reviewed the triage vital signs and the nursing notes.   HISTORY  Chief Complaint Psychiatric Evaluation   HPI Chelsey [name] is a 68 y.o. female with a history of depression who presents for psychiatric evaluation.  Patient underwent a hiatal hernia repair in 07/8293 complicated by esophageal perforation currently on osmolite supplementation QID.  Patient went to see her psychiatrist today and told them that she is feeling severely depressed for several weeks.  She feels like she is a burden to her family.  She told them that she wish she was no longer alive.  She denies having any active suicidal plan. She reports that she has been struggling with all the complications from her surgery. She denies abdominal pain, vomiting, diarrhea, fever or chills. She denies any prior h/o suicide attempt.    Past Medical History:  Diagnosis Date  . Breast cancer ([place]) 1997   right breast, radiation  . Bronchitis    recent/ 06/09/15 had chest xray/ [facility]/resolved  . Cancer [facility]) 1997   right lumpectomy,L/Ad/R   . GERD (gastroesophageal reflux disease)   . History of hiatal hernia   . Hypercholesteremia   . Hyperlipidemia   . Low BP    TYPICALLY RUNS 80'S/60'S  . Panic attack   . Personal history of malignant neoplasm of breast     Patient Active Problem List   Diagnosis Date Noted  . Dehydration 03/28/2018  . Protein-calorie malnutrition, severe 03/11/2018  . S/P repair of paraesophageal hernia 03/09/2018  . Benign neoplasm of ascending colon   . Osteoporosis 07/27/2017  . Tobacco abuse 07/27/2017  . Painful orthopaedic hardware ([place]) 06/24/2017  . Overweight with body mass index (BMI) 25.0-29.9 05/18/2017  . 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 Insomnia 01/12/2017  . Anemia 01/12/2017   . Status post shoulder replacement 12/27/2016  . Pressure injury of skin 12/27/2016  . S/P ORIF (open reduction internal fixation) fracture 11/23/2016  . Proximal humerus fracture 11/23/2016  . Panic attack 08/17/2016  . Abdominal pain 05/21/2016  . Special screening for malignant neoplasms, colon   . Benign neoplasm of descending colon   . Benign neoplasm of sigmoid colon   . Gastroesophageal reflux disease without esophagitis 06/13/2014  . H/O hypercholesterolemia 06/13/2014  . Depression, major, recurrent, moderate ([place]) 06/13/2014  . History of breast cancer 03/12/2013    Past Surgical History:  Procedure Laterality Date  . BICEPT TENODESIS Left 11/23/2016   Procedure: BICEPS TENODESIS;  Surgeon: Leim Fabry, MD;  Location: [facility] ORS;  Service: Orthopedics;  Laterality: Left;  . BREAST EXCISIONAL BIOPSY Right 1997   pos  . BREAST SURGERY Right 1997   lumpectomy  . CHOLECYSTECTOMY N/A 08/06/2016   Procedure: LAPAROSCOPIC CHOLECYSTECTOMY WITH INTRAOPERATIVE CHOLANGIOGRAM;  Surgeon: Christene Lye, MD;  Location: [facility] ORS;  Service: General;  Laterality: N/A;  . COLONOSCOPY  2008  . COLONOSCOPY WITH PROPOFOL N/A 07/21/2015   Procedure: COLONOSCOPY WITH PROPOFOL;  Surgeon: Lucilla Lame, MD;  Location: [place];  Service: Endoscopy;  Laterality: N/A;  . COLONOSCOPY WITH PROPOFOL N/A 01/05/2018   Procedure: COLONOSCOPY WITH PROPOFOL;  Surgeon: Jonathon Bellows, MD;  Location: [facility] ENDOSCOPY;  Service: Gastroenterology;  Laterality: N/A;  . ESOPHAGOGASTRODUODENOSCOPY (EGD) WITH PROPOFOL N/A 06/16/2016   Procedure: ESOPHAGOGASTRODUODENOSCOPY (EGD) WITH PROPOFOL;  Surgeon: Christene Lye, MD;  Location: [facility] ENDOSCOPY;  Service: Endoscopy;  Laterality: N/A;  .  ESOPHAGOGASTRODUODENOSCOPY (EGD) WITH PROPOFOL N/A 01/05/2018   Procedure: ESOPHAGOGASTRODUODENOSCOPY (EGD) WITH PROPOFOL;  Surgeon: Jonathon Bellows, MD;  Location: [facility] ENDOSCOPY;  Service: Gastroenterology;   Laterality: N/A;  . HARDWARE REMOVAL Left 12/27/2016   Procedure: HARDWARE REMOVAL LEFT  PROXIMAL HUMEROUS;  Surgeon: Leim Fabry, MD;  Location: [facility] ORS;  Service: Orthopedics;  Laterality: Left;  . NISSEN FUNDOPLICATION N/A 0/25/8527   Procedure: NISSEN FUNDOPLICATION;  Surgeon: Jules Husbands, MD;  Location: [facility] ORS;  Service: General;  Laterality: N/A;  . ORIF HUMERUS FRACTURE Left 11/23/2016   Procedure: OPEN REDUCTION INTERNAL FIXATION (ORIF) PROXIMAL HUMERUS FRACTURE;  Surgeon: Leim Fabry, MD;  Location: [facility] ORS;  Service: Orthopedics;  Laterality: Left;  . REPAIR OF ESOPHAGUS  03/09/2018   Procedure: REPAIR OF ESOPHAGUS;  Surgeon: Jules Husbands, MD;  Location: [facility] ORS;  Service: General;;  . REVERSE SHOULDER ARTHROPLASTY Left 12/27/2016   Procedure: REVERSE SHOULDER ARTHROPLASTY;  Surgeon: Leim Fabry, MD;  Location: [facility] ORS;  Service: Orthopedics;  Laterality: Left;  . ROBOTIC ASSISTED LAPAROSCOPIC REPAIR OF PARAESOPHAGEAL HERNIA N/A 03/09/2018   Procedure: ROBOTIC HIATAL HERNIA CONVERTED TO OPEN;  Surgeon: Jules Husbands, MD;  Location: [facility] ORS;  Service: General;  Laterality: N/A;    Prior to Admission medications   Medication Sig Start Date End Date Taking? Authorizing Provider  busPIRone (BUSPAR) 10 MG tablet Take 1 tablet (10 mg total) by mouth 2 (two) times daily. 03/06/18   Rainey Pines, MD  Calcium Carbonate-Vitamin D (CALCIUM + D PO) Take 1 tablet daily by mouth.     [provider]  Cholecalciferol (VITAMIN D) 2000 units tablet Take 2,000 Units by mouth daily.    [provider]  cyclobenzaprine (FLEXERIL) 5 MG tablet Take 1 tablet (5 mg total) by mouth 3 (three) times daily as needed for muscle spasms. 04/04/18   Tylene Fantasia, PA-C  gabapentin (NEURONTIN) 300 MG capsule Take 1 capsule (300 mg total) by mouth 3 (three) times daily. 04/04/18   Tylene Fantasia, PA-C  HYDROcodone-acetaminophen (HYCET) 7.5-325 mg/15 ml solution Take 15 mLs by mouth 4  (four) times daily as needed for up to 30 doses for moderate pain. 03/24/18   Pabon, Diego F, MD  ibuprofen (ADVIL,MOTRIN) 200 MG tablet Take 200-400 mg by mouth daily as needed for headache or moderate pain.    [provider]  Multiple Vitamin (MULTIVITAMIN) tablet Take 1 tablet daily by mouth.     [provider]  Nutritional Supplements (FEEDING SUPPLEMENT, OSMOLITE 1.5 CAL,) LIQD Place 237 mLs into feeding tube 4 (four) times daily. 04/04/18   Tylene Fantasia, PA-C  omeprazole (PRILOSEC) 20 MG capsule Take 1 capsule (20 mg total) by mouth 2 (two) times daily before a meal. 07/27/17   Kathrine Haddock, NP  PARoxetine (PAXIL) 20 MG tablet Take 1 tablet (20 mg total) by mouth daily before supper. 03/06/18   Rainey Pines, MD  Peppermint Oil (IBGARD PO) Take 2 tablets by mouth daily before supper.    [provider]  Simethicone (GAS-X PO) Take 1-2 tablets by mouth daily as needed (gas).    [provider]  simvastatin (ZOCOR) 20 MG tablet TAKE ONE TABLET BY MOUTH AT BEDTIME 12/16/17   Kathrine Haddock, NP  traZODone (DESYREL) 100 MG tablet Take 1 tablet (100 mg total) by mouth at bedtime. 03/06/18   Rainey Pines, MD    Allergies Patient has no known allergies.  Family History  Problem Relation Age of Onset  . Anxiety disorder  Brother   . Obesity Brother   . COPD Brother   . Kidney disease Mother   . Heart attack Father   . Hypertension Father   . Alcohol abuse Father   . Depression Brother   . Anxiety disorder Brother   . Breast cancer Maternal Grandmother     Social History Social History   Tobacco Use  . Smoking status: Former Smoker    Packs/day: 0.25    Years: 10.00    Pack years: 2.50    Types: Cigarettes    Start date: 11/25/2009  . Smokeless tobacco: Never Used  . Tobacco comment: 1 cigarette daily  Substance Use Topics  . Alcohol use: No    Alcohol/week: 0.0 standard drinks  . Drug use: No    Review of Systems  Constitutional:  Negative for fever. Eyes: Negative for visual changes. ENT: Negative for sore throat. Neck: No neck pain  Cardiovascular: Negative for chest pain. Respiratory: Negative for shortness of breath. Gastrointestinal: Negative for abdominal pain, vomiting or diarrhea. Genitourinary: Negative for dysuria. Musculoskeletal: Negative for back pain. Skin: Negative for rash. Neurological: Negative for headaches, weakness or numbness. Psych: No SI or HI. + depression  ____________________________________________   PHYSICAL EXAM:  VITAL SIGNS: ED Triage Vitals  Enc Vitals Group     BP 04/11/18 1356 90/66     Pulse Rate 04/11/18 1356 97     Resp 04/11/18 1356 16     Temp --      Temp src --      SpO2 04/11/18 1356 98 %     Weight 04/11/18 1358 117 lb (53.1 kg)     Height 04/11/18 1358 5\' 5"  (1.651 m)     Head Circumference --      Peak Flow --      Pain Score 04/11/18 1356 7     Pain Loc --      Pain Edu? --      Excl. in [place]? --     Constitutional: Alert and oriented. Well appearing and in no apparent distress. HEENT:      Head: Normocephalic and atraumatic.         Eyes: Conjunctivae are normal. Sclera is non-icteric.       Mouth/Throat: Mucous membranes are moist.       Neck: Supple with no signs of meningismus. Cardiovascular: Regular rate and rhythm. No murmurs, gallops, or rubs. 2+ symmetrical distal pulses are present in all extremities. No JVD. Respiratory: Normal respiratory effort. Lungs are clear to auscultation bilaterally. No wheezes, crackles, or rhonchi.  Gastrointestinal: Soft, non tender, and non distended with positive bowel sounds. No rebound or guarding. Genitourinary: No CVA tenderness. Musculoskeletal: Nontender with normal range of motion in all extremities. No edema, cyanosis, or erythema of extremities. Neurologic: Normal speech and language. Face is symmetric. Moving all extremities. No gross focal neurologic deficits are appreciated. Skin: Skin is warm, dry  and intact. No rash noted. Psychiatric: Mood and affect are normal. Speech and behavior are normal.  ____________________________________________   LABS (all labs ordered are listed, but only abnormal results are displayed)  Labs Reviewed  COMPREHENSIVE METABOLIC PANEL - Abnormal; Notable for the following components:      Result Value   Chloride 96 (*)    Glucose, Bld 112 (*)    Total Protein 8.4 (*)    ALT 84 (*)    Alkaline Phosphatase 430 (*)    All other components within normal limits  ACETAMINOPHEN LEVEL - Abnormal;  Notable for the following components:   Acetaminophen (Tylenol), Serum <10 (*)    All other components within normal limits  CBC - Abnormal; Notable for the following components:   Platelets 623 (*)    All other components within normal limits  ETHANOL  SALICYLATE LEVEL  URINE DRUG SCREEN, QUALITATIVE ([facility] ONLY)   ____________________________________________  EKG  none  ____________________________________________  RADIOLOGY  none  ____________________________________________   PROCEDURES  Procedure(s) performed: None Procedures Critical Care performed:  None ____________________________________________   INITIAL IMPRESSION / ASSESSMENT AND PLAN / ED COURSE   68 y.o. female with a history of depression who presents for psychiatric evaluation.  Patient presents with feelings of hopelessness, no active suicidal ideation.  Currently does not meet criteria for IVC.  Patient is here voluntarily requesting placement in a rehab facility to help with her G-tube feeds.  Her labs show no acute findings.  She is currently medically cleared.  I have ordered her G-tube feeds.  I will consult psychiatry for psychiatric clearance and social work for possible placement.      As part of my medical decision making, I reviewed the following data within the [place] notes reviewed and incorporated, Labs reviewed , Old chart reviewed, A  consult was requested and obtained from this/these consultant(s) Psychiatry and SW, Notes from prior ED visits and [place] Controlled Substance Database    Pertinent labs & imaging results that were available during my care of the patient were reviewed by me and considered in my medical decision making (see chart for details).    ____________________________________________   FINAL CLINICAL IMPRESSION(S) / ED DIAGNOSES  Final diagnoses:  Severe episode of recurrent major depressive disorder, without psychotic features ([place])  Hopelessness      NEW MEDICATIONS STARTED DURING THIS VISIT:  ED Discharge Orders    None       Note:  This document was prepared using Dragon voice recognition software and may include unintentional dictation errors.    Alfred Levins, [place], MD 04/11/18 [phone]

## 2018-04-11 NOTE — Telephone Encounter (Signed)
Called patient to make sure she was taking the right medication the right way. I ask patient to read me the bottle that way she understands how to take it.

## 2018-04-11 NOTE — Telephone Encounter (Signed)
-----   Message from Jules Husbands, MD sent at 04/11/2018 10:10 AM EDT ----- Please let her know that her CXR looks better, fluid is decreasing ----- Message ----- From: Interface, Rad Results In Sent: 04/11/2018   8:57 AM EDT To: Jules Husbands, MD

## 2018-04-11 NOTE — ED Notes (Signed)
She has been seen by psychiatry and cleared from a psychiatric view per Geni Bers NP   Pt awaiting social work consult to see if she can get some assistance with her feedings because she reports she cannot do them herself

## 2018-04-12 ENCOUNTER — Ambulatory Visit: Payer: Medicare Other | Admitting: Surgery

## 2018-04-12 ENCOUNTER — Ambulatory Visit: Payer: Self-pay | Admitting: Gastroenterology

## 2018-04-12 NOTE — ED Notes (Signed)
Received a call from a guy working with care management today - he reports knowledge about this pts care including setting up her home health care prior to her being discharged 04/04/2018  - he verbalized exhausting resources for her and that from there stand point she can go home with the home health care that is in place   He is going to pass this information on to our EDP

## 2018-04-12 NOTE — ED Notes (Signed)
Hourly rounding reveals patient sleeping in room. No complaints, stable, in no acute distress. Q15 minute rounds and monitoring via Rover and Officer to continue.  

## 2018-04-12 NOTE — ED Notes (Addendum)
ED  Is the patient under IVC or is there intent for IVC: no Is the patient medically cleared: Yes.   Is there vacancy in the ED BHU: Yes.   Is the population mix appropriate for patient: Yes.   Is the patient awaiting placement in inpatient or outpatient setting:  no Has the patient had a psychiatric consult:  Completed   Survey of unit performed for contraband, proper placement and condition of furniture, tampering with fixtures in bathroom, shower, and each patient room: Yes.  ; Findings:  APPEARANCE/BEHAVIOR Calm and cooperative NEURO ASSESSMENT Orientation: oriented x3   Hallucinations: No.None noted (Hallucinations) denies Speech: Normal Gait: normal RESPIRATORY ASSESSMENT Even  Unlabored respirations  CARDIOVASCULAR ASSESSMENT Pulses equal   regular rate  Skin warm and dry   GASTROINTESTINAL ASSESSMENT no GI complaint EXTREMITIES Full ROM  PLAN OF CARE Provide calm/safe environment. Vital signs assessed twice daily. ED BHU Assessment once each 12-hour shift. Collaborate with TTS daily or as condition indicates. Assure the ED provider has rounded once each shift. Provide and encourage hygiene. Provide redirection as needed. Assess for escalating behavior; address immediately and inform ED provider.  Assess family dynamic and appropriateness for visitation as needed: Yes.  ; If necessary, describe findings:  Educate the patient/family about BHU procedures/visitation: Yes.  ; If necessary, describe findings:

## 2018-04-12 NOTE — ED Notes (Signed)
Hospital bed provided  -  Pt ambulatory from one bed to the other  No verbalized needs or concerns  - she is awaiting social work for home assistance  Pt reports she cannot do her feedings through the gtube by herself  Pt with history of shoulder surgery  Physical therapy consult ordered by MD  Per pt she was evaluated by PT prior to discharge on 04/04/2018

## 2018-04-12 NOTE — ED Notes (Signed)
BEHAVIORAL HEALTH ROUNDING Patient sleeping: No. Patient alert and oriented: yes Behavior appropriate: Yes.  ; If no, describe:  Nutrition and fluids offered: yes Toileting and hygiene offered: Yes  Sitter present: q15 minute observations and security  monitoring Law enforcement present: Yes  ODS  

## 2018-04-12 NOTE — ED Provider Notes (Signed)
-----------------------------------------   6:40 AM on 04/12/2018 -----------------------------------------   Blood pressure 93/67, pulse 72, temperature 98.1 F (36.7 C), temperature source Oral, resp. rate 16, height 5\' 5"  (1.651 m), weight 53.1 kg, SpO2 100 %.  The patient is calm and cooperative at this time.  There have been no acute events since the last update.  Awaiting disposition plan from clinical social work team.   Paulette Blanch, MD 04/12/18 [phone]

## 2018-04-12 NOTE — ED Provider Notes (Signed)
-----------------------------------------   3:57 PM on 04/12/2018 -----------------------------------------  Social worker is seen the patient, they were unable to obtain placement at a skilled nursing facility for the patient.  Case management has been consulted.  I discussed with case management they have set up home health, state no further options from the emergency department at this time.  Patient will be discharged from the emergency department with home health care.   Harvest Dark, MD 04/12/18 1558

## 2018-04-12 NOTE — ED Notes (Signed)

## 2018-04-12 NOTE — Discharge Instructions (Addendum)
You have been seen in the emergency department for a  psychiatric concern. You have been evaluated both medically as well as psychiatrically. Please follow-up with your outpatient resources provided. Return to the emergency department for any worsening symptoms, or any thoughts of hurting yourself or anyone else so that we may attempt to help you. 

## 2018-04-12 NOTE — Care Management (Addendum)
Patient brought into ED. Patient has recently discharged with home health. Patient was seen by Psychiatry in ED. During patients last admission she was hesitant to leave home since she had a PEG tube requiring feeds, unfortunately she has no mobility limitations therefor not qualifying for SNF. PT has confirmed such in the ED and still recommend Home health. Patient has demonstrated ability to completely maintain her PEG tube is capable to provide herself supplemental nutrition via PEG. Patient will dc from ED as there is no medical indication for admission. Provided family with "on call RN number" that was given to me by [facility]. RNCM has notified [facility] home health of discharge, no need for new orders. Patient sister tells me they are working on an FL-2 to work on placement.

## 2018-04-12 NOTE — ED Notes (Signed)
Called and spoke with Randall Hiss in social work  - he verbalizes that there is no order  - it looks as if consult has been completed but no one from social work has seen the pt yet   Randall Hiss states that the order has been completed and that this is a care management issue  He verbalizes that he will call care managemnent to come and see the pt   Pt stating  "I don't know why I could not go to rehab last week when I was discharged from this hospital     EDP ordered social work consult yesterday

## 2018-04-12 NOTE — ED Notes (Signed)
Pt ambulating in the hallway with PT 

## 2018-04-12 NOTE — Clinical Social Work Note (Signed)
CSW received phone call from ED, they were saying that patient had a social work consult in yesterday.  CSW reviewed patient's notes from yesterday, the CSW covering yesterday was waiting for psych to clear patient.  CSW reviewed chart today, PT recommended home health, and due to her insurance, she will most likely not be approved for SNF placement.  CSW notified case manager who will speak with patient to arrange home health, [place] signing off.  Please reconsult if social work needs arise.  Evette Cristal, MSW, LCSW ED Covering CSW [phone] 04/12/2018 2:56 PM

## 2018-04-12 NOTE — Evaluation (Signed)
Physical Therapy Evaluation Patient Details Name: Chelsey [name] MRN: [id number] DOB: 1950/11/25 Today's Date: 04/12/2018   History of Present Illness  Pt is a 68 y.o. female presenting to ED 04/11/18 with suicidal ideation.  Of note, pt s/p open hiatal hernia repair and GE junction perforation repair with nissan fundoplication and placement of gastrojejunostomy tube during previous admission (03/09/18).  PMH includes h/o L LE weakness, R breast CA s/p lumpectomy, panic attack, and h/o L TSA reverse (pt reports h/o weakness since multiple surgeries).  Clinical Impression  Prior to hospital admission, pt was independent with ambulation; reports no recent falls.  Pt lives with her son in 1 level home with 2 steps (no railing) to enter.  Currently pt is modified independent with bed mobility; independent with transfers; and SBA with ambulation 160 feet no AD.  No loss of balance noted with ambulation with head turns R/L/up/down, or increasing/decreasing speed.  Pt reporting 5-6/10 PEG tube site pain beginning/during/end of session (no change in pain noted with activity).  Pt demonstrating L UE weakness (pt reports she was receiving therapy for L UE but has not been able to do any therapy recently d/t other medical issues).  Pt would benefit from skilled PT to address noted impairments and functional limitations (see below for any additional details).  Upon hospital discharge, recommend pt discharge with HHPT and increased services at home (including PT, OT, RN, and aide).  Pt requesting discharge to STR d/t PEG tube feeding concerns.    Follow Up Recommendations Home health PT    Equipment Recommendations  None recommended by PT    Recommendations for Other Services       Precautions / Restrictions Precautions Precautions: Fall Precaution Comments: gastrojejunostomy tube Restrictions Weight Bearing Restrictions: No      Mobility  Bed Mobility Overal bed mobility: Modified Independent              General bed mobility comments: Semi-supine to/from sit without any noted difficulties.  Transfers Overall transfer level: Independent Equipment used: None Transfers: Sit to/from Stand Sit to Stand: Independent         General transfer comment: fairly strong stand and controlled descent noted; steady  Ambulation/Gait Ambulation/Gait assistance: Supervision Gait Distance (Feet): 160 Feet Assistive device: None Gait Pattern/deviations: Decreased step length - right;Decreased step length - left Gait velocity: decreased   General Gait Details: steady ambulation  Stairs            Wheelchair Mobility    Modified Rankin (Stroke Patients Only)       Balance Overall balance assessment: Needs assistance Sitting-balance support: No upper extremity supported;Feet supported Sitting balance-Leahy Scale: Normal Sitting balance - Comments: steady sitting reaching outside BOS   Standing balance support: No upper extremity supported Standing balance-Leahy Scale: Good Standing balance comment: steady standing reaching within BOS                             Pertinent Vitals/Pain Pain Assessment: 0-10 Pain Score: 6  Pain Location: addomen, side, left flank Pain Descriptors / Indicators: Sore;Aching Pain Intervention(s): Limited activity within patient's tolerance;Monitored during session;Repositioned;Other (comment)(RN notified)  Vitals (HR and O2 on room air) stable and WFL throughout treatment session.    Home Living Family/patient expects to be discharged to:: Private residence Living Arrangements: Children(pt's son) Available Help at Discharge: Family;Available PRN/intermittently Type of Home: House Home Access: Stairs to enter Entrance Stairs-Rails: None Entrance Stairs-Number of Steps: 2 Home  Layout: One level Home Equipment: Bedside commode;Cane - single point;Walker - 2 wheels      Prior Function Level of Independence: Independent          Comments: Pt reports no recent falls.     Hand Dominance        Extremity/Trunk Assessment   Upper Extremity Assessment Upper Extremity Assessment: RUE deficits/detail;LUE deficits/detail RUE Deficits / Details: generalized weakness noted LUE Deficits / Details: h/o recent L shoulder surgery 2019, grossly 3-/5 shoulder flexion, 3+/5 elbow flex/ext, grip 4/5    Lower Extremity Assessment Lower Extremity Assessment: Generalized weakness    Cervical / Trunk Assessment Cervical / Trunk Assessment: Normal  Communication   Communication: No difficulties  Cognition Arousal/Alertness: Awake/alert Behavior During Therapy: Flat affect Overall Cognitive Status: Within Functional Limits for tasks assessed                                        General Comments General comments (skin integrity, edema, etc.): PEG tube in place.  Nursing cleared pt for participation in physical therapy.  Pt agreeable to PT session.    Exercises     Assessment/Plan    PT Assessment Patient needs continued PT services  PT Problem List Decreased strength;Decreased activity tolerance;Decreased mobility;Pain       PT Treatment Interventions DME instruction;Gait training;Stair training;Functional mobility training;Therapeutic activities;Therapeutic exercise;Balance training;Patient/family education    PT Goals (Current goals can be found in the Care Plan section)  Acute Rehab PT Goals Patient Stated Goal: to improve strength PT Goal Formulation: With patient Time For Goal Achievement: 04/26/18 Potential to Achieve Goals: Good    Frequency Min 2X/week   Barriers to discharge        Co-evaluation               AM-PAC PT "6 Clicks" Mobility  Outcome Measure Help needed turning from your back to your side while in a flat bed without using bedrails?: None Help needed moving from lying on your back to sitting on the side of a flat bed without using bedrails?:  None Help needed moving to and from a bed to a chair (including a wheelchair)?: None Help needed standing up from a chair using your arms (e.g., wheelchair or bedside chair)?: None Help needed to walk in hospital room?: A Little Help needed climbing 3-5 steps with a railing? : A Little 6 Click Score: 22    End of Session Equipment Utilized During Treatment: Gait belt(up high away from PEG tube) Activity Tolerance: Patient limited by pain;Patient limited by fatigue Patient left: in bed;with call bell/phone within reach;with bed alarm set Nurse Communication: Mobility status PT Visit Diagnosis: Other abnormalities of gait and mobility (R26.89);Muscle weakness (generalized) (M62.81)    Time: 8264-1583 PT Time Calculation (min) (ACUTE ONLY): 12 min   Charges:   PT Evaluation $PT Eval Low Complexity: 1 Low         Chick Cousins, PT 04/12/18, 11:20 AM [phone]

## 2018-04-12 NOTE — ED Notes (Signed)
Geni Bers psychiatric NP - Josh from care management, pt relations and myself into her room to review resources - course of care - [facility] and discharge instructions with her and her sister

## 2018-04-13 ENCOUNTER — Telehealth: Payer: Self-pay | Admitting: *Deleted

## 2018-04-13 ENCOUNTER — Ambulatory Visit: Payer: Self-pay | Admitting: Gastroenterology

## 2018-04-13 ENCOUNTER — Ambulatory Visit: Payer: Medicare Other | Admitting: *Deleted

## 2018-04-13 ENCOUNTER — Other Ambulatory Visit: Payer: Self-pay

## 2018-04-13 DIAGNOSIS — Z4889 Encounter for other specified surgical aftercare: Secondary | ICD-10-CM

## 2018-04-13 NOTE — Telephone Encounter (Signed)
Called patient and waiting for Dr. Dahlia Byes to advise

## 2018-04-13 NOTE — Progress Notes (Signed)
Added drain plug to tub, cleaned area.

## 2018-04-13 NOTE — Telephone Encounter (Signed)
Patient called the answering service and stated that she is on a feeding tube, the cap came off and everything is coming out, she stated that there is a crack at the base of the tube. Please call and advise

## 2018-04-13 NOTE — Telephone Encounter (Signed)
Nurse visit today.

## 2018-04-17 ENCOUNTER — Telehealth: Payer: Self-pay | Admitting: *Deleted

## 2018-04-17 NOTE — Telephone Encounter (Signed)
Patients daughter called patty and stated that she was given gabapentin for pain but it is not working, the patient wants to try something different.

## 2018-04-17 NOTE — Telephone Encounter (Signed)
Patient to take  Advil, Tylenol as needed for pain.

## 2018-04-18 ENCOUNTER — Other Ambulatory Visit: Payer: Self-pay

## 2018-04-18 ENCOUNTER — Encounter: Payer: Self-pay | Admitting: Surgery

## 2018-04-18 ENCOUNTER — Encounter: Payer: Self-pay | Admitting: *Deleted

## 2018-04-18 ENCOUNTER — Ambulatory Visit (INDEPENDENT_AMBULATORY_CARE_PROVIDER_SITE_OTHER): Payer: Federal, State, Local not specified - PPO | Admitting: Surgery

## 2018-04-18 VITALS — BP 78/50 | HR 97 | Temp 98.1°F | Resp 15 | Ht 65.0 in | Wt 117.2 lb

## 2018-04-18 DIAGNOSIS — Z8719 Personal history of other diseases of the digestive system: Secondary | ICD-10-CM

## 2018-04-18 DIAGNOSIS — Z09 Encounter for follow-up examination after completed treatment for conditions other than malignant neoplasm: Secondary | ICD-10-CM

## 2018-04-18 DIAGNOSIS — E43 Unspecified severe protein-calorie malnutrition: Secondary | ICD-10-CM

## 2018-04-18 DIAGNOSIS — Z9889 Other specified postprocedural states: Secondary | ICD-10-CM

## 2018-04-18 MED ORDER — LIDOCAINE 5 % EX OINT: 1.0000 "application " | TOPICAL_OINTMENT | Freq: Three times a day (TID) | CUTANEOUS | 0 refills | Status: DC | PRN

## 2018-04-18 MED ORDER — HYDROCODONE-ACETAMINOPHEN 7.5-325 MG/15ML PO SOLN: 10.0000 mL | Freq: Four times a day (QID) | ORAL | 0 refills | Status: DC | PRN

## 2018-04-18 NOTE — Telephone Encounter (Signed)
Pt apparently called yesterday for pain meds. Please have her come to my office next week, if that would not be possible one of the other providers may see her this week. I'm on PAL until Monday

## 2018-04-18 NOTE — Progress Notes (Signed)
Patient needs to be scheduled with [facility] for GJ tube exchange due to tube clog and broken balloon per Dr. Hampton Abbot. I called [facility] but had to leave a message. Patient aware we will be calling her once arranged with [facility].   Patient to follow up in the office in 2 weeks with Dr. Dahlia Byes (per Dr. Hampton Abbot) as scheduled-05-01-18 at 10:15 am.

## 2018-04-18 NOTE — Progress Notes (Signed)
04/18/2018  HPI: Chelsey [name] is a 68 y.o. female s/p robotic converted to open hiatal hernia repair and nissen fundoplication on 2/56/38, complicated by esophageal perforation intraop.  She presents today because she has been having pain at her [place] tube site.    She reports that she has been having issues with eating.  First, she reports that about half the time, she feels like food is stuck in the middle of her chest and has to come up.  She also is not getting as much tube feeds through her [place] tube because she is not able to run the feeds herself and her sister is only able to help her once or so a day.    Next, she reports pain at the [place] tube site, with leakage of tan fluid around the tube, as well as pain radiating both laterally and medially from the tube site.  She had tried Vicodin post-operatively which was helping, and reports that more recently, the Neurontin prescription given has not really helped at all with pain.  Lastly, she also reports having constipation, despite of using MiraLax.  She does have a history of chronic constipation before surgery and patient reports that Dr. Vicente Males with GI had given her Linzess in the past, but that was too aggressive for her.  Vital signs: BP (!) 78/50   Pulse 97   Temp 98.1 F (36.7 C)   Resp 15   Ht 5\' 5"  (1.651 m)   Wt 117 lb 3.2 oz (53.2 kg)   LMP  (LMP Unknown)   BMI 19.50 kg/m    Physical Exam: Constitutional:  No acute distress, appears malnourished, burps frequently. Abdomen:  Soft, non-distended, with tenderness to palpation at the [place] tube site.  The midline incision is healing well without evidence of infection.  The [place] tube has inferolaterally a small ulcer at the skin as well as superomedially, consistent with wounds from her sutures holding the tube disc.  One suture has dislodged.  There is tan fluid with small leak around the tube, consistent with feeds/stomach contents but there is no erythema or fluctuance or evidence of any  abscess.  When checking her tube, the balloon has burst and thus the tube is not well cinched against the abdominal wall.  Assessment/Plan: This is a 68 y.o. female s/p obotic converted to open hiatal hernia repair and nissen fundoplication on 9/37/34, complicated by esophageal perforation intraop.  Discussed with the patient that the trouble swallowing or feeling that food is stuck is most likely due to edema at the [place] junction at the site of perforation repair and fundoplication.  She is just under 6 weeks out from surgery, and thus there could still be edema and inflammation present.  Discussed with the patient that as this improves after surgery, the symptoms should also improve.  If there is no improvement after 8 weeks or so, then it could be useful to obtain an UGI study and refer her to GI for EGD, evaluation of the repair and possible dilatation of any stricture.    With regards to the [place] tube, the balloon has burst and one of two sutures holding rubber disc in place is dislodged, so the tube is not well secured, allowing gastric contents to leak around the tube.  At bedside, I infiltrated 2 cc of 1% lidocaine at 6 and 12 o clock positions and resecured the disc with 3-0 Nylon suture.  The tube does not slide through the disc easily so at  least it's partially secured this way.  However, we will set her up with IR for replacement of GJ tube.  Despite of COVID-19 restrictions, I do not believe this tube exchange should be delayed and should be done soon.  This will help with any ulceration of the skin from the secretions and some of her pain.  With regards to her pain, it may be that the [place] tube is irritating a skin nerve which is why the pain radiates laterally and medially.  She reports that Neurontin does not help with the pain.  Vicodin had helped better though it did not get rid of the pain altogether.  Discussed with her the limitations of pain control medication.  I will give her a new  prescription for Vicodin liquid for pain, and also give her a prescription for Lidocaine 5% ointment that she can apply around the tube insertion site as needed.  There is currently no evidence of infection and no antibiotic is needed.   With regards to nutrition, I emphasized how important it is to continue using the feeding tube, despite of her wishes to remove it due to the pain.  Explained to her that she can use 2 or 3 of the feeding cans at one time through the tube so that her sister can assist her with it more easily.  Recommended that if possible, she should do small frequent meals, alternating between po and per tube.  Recommended that she stay away from dry foods since they will have a harder time sliding down the esophagus.  Also recommended that she can add whey protein powder to milkshake, juice or other fluids to get more protein calories to her diet.  She needs to prove to [place] that she can gain weight with po diet before removing the tube.  This would also be dependent on her being able to tolerate po food without feeling it's stuck.  Patient showed understanding of all this and is willing to continue with this plan.  I feel that today's visit has helped her a lot.  There is clearly a lot going on and she's depressed and frustrated with her pain and situation.  Offered reassurance and patience with [place] so that we can help her get better.  She will follow up with Dr. Dahlia Byes in office in two weeks so he can continue having further discussions with her.  Despite of COVID-19 restrictions, I believe this patient would be much better served with in-office visits rather than phone or video.   Melvyn Neth, [facility]

## 2018-04-18 NOTE — Patient Instructions (Addendum)
We will schedule the appointment with Radiology to have your tube replaced.    You may try Dulcolax and senokot for constipation.  Stay hydrated.

## 2018-04-18 NOTE — Telephone Encounter (Signed)
Appointment with Dr Hampton Abbot is 04-18-18.

## 2018-04-19 ENCOUNTER — Other Ambulatory Visit: Payer: Self-pay | Admitting: Surgery

## 2018-04-19 ENCOUNTER — Other Ambulatory Visit: Payer: Self-pay | Admitting: *Deleted

## 2018-04-19 ENCOUNTER — Telehealth: Payer: Self-pay | Admitting: *Deleted

## 2018-04-19 DIAGNOSIS — Z9889 Other specified postprocedural states: Secondary | ICD-10-CM

## 2018-04-19 DIAGNOSIS — Z8719 Personal history of other diseases of the digestive system: Secondary | ICD-10-CM

## 2018-04-19 DIAGNOSIS — Z09 Encounter for follow-up examination after completed treatment for conditions other than malignant neoplasm: Secondary | ICD-10-CM

## 2018-04-19 DIAGNOSIS — K9420 Gastrostomy complication, unspecified: Secondary | ICD-10-CM

## 2018-04-19 NOTE — Telephone Encounter (Signed)
Patient was contacted today and made aware that she has been scheduled for GJ tube exchange for Friday, 04-21-18 at 2:30 pm (arrive 1:30 pm). She will check in at the [facility] registration desk and aware she cannot have any visitors with her. Patient aware the IR nurse will be calling to further review instructions with her.   The patient was reminded to keep follow up appointment with Dr. Dahlia Byes as scheduled for 05-01-18 at 10:15 am.

## 2018-04-21 ENCOUNTER — Other Ambulatory Visit: Payer: Self-pay

## 2018-04-21 ENCOUNTER — Ambulatory Visit
Admission: RE | Admit: 2018-04-21 | Discharge: 2018-04-21 | Disposition: A | Discharge status: Home / Self Care | Payer: Federal, State, Local not specified - PPO | Source: Ambulatory Visit | Attending: Surgery | Admitting: Surgery

## 2018-04-21 DIAGNOSIS — Z8719 Personal history of other diseases of the digestive system: Secondary | ICD-10-CM

## 2018-04-21 DIAGNOSIS — K9420 Gastrostomy complication, unspecified: Secondary | ICD-10-CM | POA: Diagnosis not present

## 2018-04-21 DIAGNOSIS — Z09 Encounter for follow-up examination after completed treatment for conditions other than malignant neoplasm: Secondary | ICD-10-CM

## 2018-04-21 DIAGNOSIS — Z9889 Other specified postprocedural states: Secondary | ICD-10-CM

## 2018-04-21 HISTORY — PX: IR GJ TUBE CHANGE: IMG1440

## 2018-04-21 MED ORDER — LIDOCAINE HCL (PF) 1 % IJ SOLN
INTRAMUSCULAR | Status: AC
  Filled 2018-04-21: qty 30

## 2018-04-21 MED ORDER — LIDOCAINE VISCOUS HCL 2 % MT SOLN
OROMUCOSAL | Status: AC
  Filled 2018-04-21: qty 15

## 2018-04-21 NOTE — Discharge Instructions (Signed)
Gastrostomy Tube Replacement, Care After  This sheet gives you information about how to care for yourself after your procedure. Your health care provider may also give you more specific instructions. If you have problems or questions, contact your health care provider.  What can I expect after the procedure?  After the procedure, it is common to have:   Mild pain in your abdomen.   A small amount of blood-tinged fluid leaking from the replacement site.  Follow these instructions at home:     You may resume your normal level of activity.   You may resume your normal feedings.   Wash your hands before and after caring for your gastrostomy tube.   Check the skin around the tube site insertion for redness, a rash, swelling, drainage, or extra tissue growth. If you notice any of these, call your health care provider.   Care for your gastrostomy tube as you did before, or as directed by your health care provider.  Contact a health care provider if:   You have a fever or chills.   You have redness or irritation near the insertion site.   You continue to have pain in the abdomen or leaking around your gastrostomy tube.  Get help right away if:   You develop bleeding or a lot of discharge around the tube.   You have severe pain in the abdomen.   Your new tube is not working properly.   You are unable to get feedings into the tube.   Your tube comes out for any reason.  Summary   Follow specific instructions as told by your health care provider. If you have problems or questions, contact your health care provider.   After the procedure you may resume your normal level of activity and normal feedings.   Care for your gastrostomy tube as you did before, or as directed by your health care provider.  This information is not intended to replace advice given to you by your health care provider. Make sure you discuss any questions you have with your health care provider.  Document Released: 08/08/2013 Document  Revised: 02/16/2017 Document Reviewed: 02/16/2017  Elsevier Interactive Patient Education  2019 [employer]

## 2018-04-21 NOTE — Procedures (Signed)
18 Fr GJ tube exchange Tip is in jejunum EBL 0 Comp 0

## 2018-04-24 NOTE — Progress Notes (Signed)
[facility] paper work has been completed and faxed for the patient.

## 2018-05-01 ENCOUNTER — Other Ambulatory Visit: Payer: Self-pay

## 2018-05-01 ENCOUNTER — Ambulatory Visit (INDEPENDENT_AMBULATORY_CARE_PROVIDER_SITE_OTHER): Payer: Federal, State, Local not specified - PPO | Admitting: Surgery

## 2018-05-01 ENCOUNTER — Encounter: Payer: Self-pay | Admitting: Surgery

## 2018-05-01 ENCOUNTER — Ambulatory Visit (INDEPENDENT_AMBULATORY_CARE_PROVIDER_SITE_OTHER): Payer: Federal, State, Local not specified - PPO | Admitting: Psychiatry

## 2018-05-01 ENCOUNTER — Encounter: Payer: Self-pay | Admitting: Psychiatry

## 2018-05-01 VITALS — BP 106/66 | HR 66 | Temp 97.5°F | Ht 65.0 in | Wt 111.0 lb

## 2018-05-01 DIAGNOSIS — Z09 Encounter for follow-up examination after completed treatment for conditions other than malignant neoplasm: Secondary | ICD-10-CM

## 2018-05-01 DIAGNOSIS — F331 Major depressive disorder, recurrent, moderate: Secondary | ICD-10-CM | POA: Diagnosis not present

## 2018-05-01 MED ORDER — METOCLOPRAMIDE HCL 5 MG PO TABS: 5.0000 mg | ORAL_TABLET | Freq: Two times a day (BID) | ORAL | 0 refills | Status: DC

## 2018-05-01 NOTE — Progress Notes (Cosign Needed)
TC on 05-01-18 @ 12:26 went over patient allergies and no changes, medications and pharmacy was reviewed with no changes.  Pt medical and surgical hx was reviewed with no changes. Vitals were not done at this time due to this is a phone/web consult.

## 2018-05-01 NOTE — Patient Instructions (Addendum)
Return in two weks.  The patient is aware to call back for any questions or concerns.
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
## 2018-05-01 NOTE — Progress Notes (Signed)
Patient ID: Chelsey [name], female   DOB: 1950-05-06, 67 y.o.   MRN: [id number]   Patient is a 68 year old female with history of depression who was followed up for her medications. She reported that she had  hernia surgery in Feb and feeding to placed as he was not eating well and losing weight. She reported that she continues to lose weight and her sister has been helping her with her feeding. Her sister was feeding her when I called earlier. Patient reported that she is feeling distressed because of the feeding tube and is not feeling good. Patient stated that she is also not getting therapy for her shoulder and it is getting weaker. She stated that she is staying home most of the time.she has been compliant with her medications. She currently denied having any side effects of the medications and is able to contract for safety. No other acute issues noted at this time.  Plan: Continue medications as prescribed. I will call her in one month to check on her symptoms and on her medications.  I have discussed the assessment and treatment plan with the patient. The patient was provided an opportunity to ask questions and all were answered. The patient agreed with the plan and demonstrated an understanding of the instructions.   The patient was advised to call back or seek an in-person evaluation if the symptoms worsen or if the condition fails to improve as anticipated.   I provided 10 minutes of non-face-to-face time during this encounter.

## 2018-05-03 ENCOUNTER — Telehealth: Payer: Self-pay | Admitting: *Deleted

## 2018-05-03 NOTE — Telephone Encounter (Signed)
Nurse called today and states the will see her for 2 more weeks, than discharge her.

## 2018-05-10 ENCOUNTER — Telehealth: Payer: Self-pay | Admitting: *Deleted

## 2018-05-10 NOTE — Telephone Encounter (Signed)
Patient was contacted at the request of Dr. Dahlia Byes today.   The patient had an appointment with him on 05-15-18 at 9:45 am. Dr. Dahlia Byes will be in surgery at that time and is requesting to see patient later that day.   Patient to arrive 11:30 am on 05-15-18. She is aware to call the office prior to coming in due to COVID-19 restrictions.   The patient also stated that the left side of her stomach is swollen. She does have a GJ tube. Patient reports that home health is coming out later today. The patient was instructed to have the nurse assess the area and call the office if she feels that the patient needs to be seen prior to Monday.   Patient verbalizes understanding of the above.

## 2018-05-15 ENCOUNTER — Ambulatory Visit (INDEPENDENT_AMBULATORY_CARE_PROVIDER_SITE_OTHER): Payer: Federal, State, Local not specified - PPO | Admitting: Surgery

## 2018-05-15 ENCOUNTER — Ambulatory Visit: Payer: Federal, State, Local not specified - PPO | Admitting: Surgery

## 2018-05-15 ENCOUNTER — Other Ambulatory Visit: Payer: Self-pay

## 2018-05-15 ENCOUNTER — Encounter: Payer: Self-pay | Admitting: Surgery

## 2018-05-15 VITALS — BP 84/60 | HR 93 | Temp 97.2°F | Ht 65.0 in | Wt 114.0 lb

## 2018-05-15 DIAGNOSIS — Z09 Encounter for follow-up examination after completed treatment for conditions other than malignant neoplasm: Secondary | ICD-10-CM

## 2018-05-15 MED ORDER — AMOXICILLIN-POT CLAVULANATE 875-125 MG PO TABS: 1.0000 | ORAL_TABLET | Freq: Two times a day (BID) | ORAL | 0 refills | Status: AC

## 2018-05-15 NOTE — Progress Notes (Signed)
Outpatient Surgical Follow Up  05/15/2018  Chelsey [name] is an 68 y.o. female.   Chief Complaint  Patient presents with  . Pain    HPI: s/p paraesophageal hernia w esophageal perf and fundoplications and GJ tube.  Main issue is social. She is taking po, overall improvement in PO intake. . She is Able to eat scramble eggs, tuna, cereal.  No fevers or chills. Weight today is  114 lbs that is 3 pounds up from the last visit. She now has some pain at the superior portions of the incisions. She did not take reglan because home health nurse described some side effects to her. Sister still using tube about 3 cans a day.   Past Medical History:  Diagnosis Date  . Breast cancer ([place]) 1997   right breast, radiation  . Bronchitis    recent/ 06/09/15 [facility]/resolved  . Cancer [facility]) 1997   right lumpectomy,L/Ad/R   . GERD (gastroesophageal reflux disease)   . History of hiatal hernia   . Hypercholesteremia   . Hyperlipidemia   . Low BP    TYPICALLY RUNS 80'S/60'S  . Panic attack   . Personal history of malignant neoplasm of breast     Past Surgical History:  Procedure Laterality Date  . BICEPT TENODESIS Left 11/23/2016   Procedure: BICEPS TENODESIS;  Surgeon: Leim Fabry, MD;  Location: [facility];  Service: Orthopedics;  Laterality: Left;  . BREAST EXCISIONAL BIOPSY Right 1997   pos  . BREAST SURGERY Right 1997   lumpectomy  . CHOLECYSTECTOMY N/A 08/06/2016   Procedure: LAPAROSCOPIC CHOLECYSTECTOMY WITH INTRAOPERATIVE CHOLANGIOGRAM;  Surgeon: Christene Lye, MD;  Location: [facility];  Service: General;  Laterality: N/A;  . COLONOSCOPY  2008  . COLONOSCOPY WITH PROPOFOL N/A 07/21/2015   Procedure: COLONOSCOPY WITH PROPOFOL;  Surgeon: Lucilla Lame, MD;  Location: [place];  Service: Endoscopy;  Laterality: N/A;  . COLONOSCOPY WITH PROPOFOL N/A 01/05/2018   Procedure: COLONOSCOPY WITH PROPOFOL;  Surgeon: Jonathon Bellows, MD;  Location: [facility]  ENDOSCOPY;  Service: Gastroenterology;  Laterality: N/A;  . ESOPHAGOGASTRODUODENOSCOPY (EGD) WITH PROPOFOL N/A 06/16/2016   Procedure: ESOPHAGOGASTRODUODENOSCOPY (EGD) WITH PROPOFOL;  Surgeon: Christene Lye, MD;  Location: [facility] ENDOSCOPY;  Service: Endoscopy;  Laterality: N/A;  . ESOPHAGOGASTRODUODENOSCOPY (EGD) WITH PROPOFOL N/A 01/05/2018   Procedure: ESOPHAGOGASTRODUODENOSCOPY (EGD) WITH PROPOFOL;  Surgeon: Jonathon Bellows, MD;  Location: [facility] ENDOSCOPY;  Service: Gastroenterology;  Laterality: N/A;  . HARDWARE REMOVAL Left 12/27/2016   Procedure: HARDWARE REMOVAL LEFT  PROXIMAL HUMEROUS;  Surgeon: Leim Fabry, MD;  Location: [facility];  Service: Orthopedics;  Laterality: Left;  . IR GJ TUBE CHANGE  04/21/2018  . NISSEN FUNDOPLICATION N/A 9/52/8413   Procedure: NISSEN FUNDOPLICATION;  Surgeon: Jules Husbands, MD;  Location: [facility];  Service: General;  Laterality: N/A;  . ORIF HUMERUS FRACTURE Left 11/23/2016   Procedure: OPEN REDUCTION INTERNAL FIXATION (ORIF) PROXIMAL HUMERUS FRACTURE;  Surgeon: Leim Fabry, MD;  Location: [facility];  Service: Orthopedics;  Laterality: Left;  . REPAIR OF ESOPHAGUS  03/09/2018   Procedure: REPAIR OF ESOPHAGUS;  Surgeon: Jules Husbands, MD;  Location: [facility];  Service: General;;  . REVERSE SHOULDER ARTHROPLASTY Left 12/27/2016   Procedure: REVERSE SHOULDER ARTHROPLASTY;  Surgeon: Leim Fabry, MD;  Location: [facility];  Service: Orthopedics;  Laterality: Left;  . ROBOTIC ASSISTED LAPAROSCOPIC REPAIR OF PARAESOPHAGEAL HERNIA N/A 03/09/2018   Procedure: ROBOTIC HIATAL HERNIA CONVERTED TO OPEN;  Surgeon: Jules Husbands, MD;  Location: [facility]  ORS;  Service: General;  Laterality: N/A;    Family History  Problem Relation Age of Onset  . Anxiety disorder Brother   . Obesity Brother   . COPD Brother   . Kidney disease Mother   . Heart attack Father   . Hypertension Father   . Alcohol abuse Father   . Depression Brother   . Anxiety disorder Brother   . Breast  cancer Maternal Grandmother     Social History:  reports that she has quit smoking. Her smoking use included cigarettes. She started smoking about 8 years ago. She has a 2.50 pack-year smoking history. She has never used smokeless tobacco. She reports that she does not drink alcohol or use drugs.  Allergies: No Known Allergies  Medications reviewed.    ROS Full ROS performed and is otherwise negative other than what is stated in HPI   BP (!) 84/60   Pulse 93   Temp (!) 97.2 F (36.2 C) (Skin)   Ht 5\' 5"  (1.651 m)   Wt 114 lb (51.7 kg)   LMP  (LMP Unknown)   SpO2 96%   BMI 18.97 kg/m   Physical Exam NAD, alert, malnourtished Abd: soft, superior portio of wound mildly tender. I placed a qtip and drained 2cc of pus. Fascia intact. G-J tube in placed. No peritonitis.    Assessment/Plan: Stitch abscess drained. As a precaution will prescribe 1 week augmentin.  She still wishes to have the feeding tube removed. I had a lengthy discussion with the pt and she has agreed to keep it 2 more weeks, she gained some weight which is in part of the additional nutritional supplements.  I have agreed to remove the tube in 2 weeks if clinically indicated. The pt is adamant about having the tube removed at that time. Greater than 50% of the 25 minutes  visit was spent in counseling/coordination of care   Caroleen Hamman, MD [place] Surgeon

## 2018-05-15 NOTE — Patient Instructions (Addendum)
Return in two weeks. Rx sent. Please take Reglan two times daily.

## 2018-05-29 ENCOUNTER — Ambulatory Visit (INDEPENDENT_AMBULATORY_CARE_PROVIDER_SITE_OTHER): Payer: Federal, State, Local not specified - PPO | Admitting: Psychiatry

## 2018-05-29 ENCOUNTER — Other Ambulatory Visit: Payer: Self-pay

## 2018-05-29 ENCOUNTER — Ambulatory Visit
Admission: RE | Admit: 2018-05-29 | Discharge: 2018-05-29 | Disposition: A | Discharge status: Home / Self Care | Payer: Federal, State, Local not specified - PPO | Attending: Surgery | Admitting: Surgery

## 2018-05-29 ENCOUNTER — Ambulatory Visit
Admission: RE | Admit: 2018-05-29 | Discharge: 2018-05-29 | Disposition: A | Discharge status: Home / Self Care | Payer: Federal, State, Local not specified - PPO | Source: Ambulatory Visit | Attending: Surgery | Admitting: Surgery

## 2018-05-29 ENCOUNTER — Encounter: Payer: Self-pay | Admitting: Psychiatry

## 2018-05-29 ENCOUNTER — Ambulatory Visit (INDEPENDENT_AMBULATORY_CARE_PROVIDER_SITE_OTHER): Payer: Federal, State, Local not specified - PPO | Admitting: Surgery

## 2018-05-29 ENCOUNTER — Encounter: Payer: Self-pay | Admitting: Surgery

## 2018-05-29 VITALS — BP 80/50 | HR 95 | Temp 97.7°F | Resp 12 | Ht 65.0 in | Wt 112.2 lb

## 2018-05-29 DIAGNOSIS — Z8719 Personal history of other diseases of the digestive system: Secondary | ICD-10-CM | POA: Diagnosis present

## 2018-05-29 DIAGNOSIS — R1310 Dysphagia, unspecified: Secondary | ICD-10-CM | POA: Insufficient documentation

## 2018-05-29 DIAGNOSIS — Z9889 Other specified postprocedural states: Principal | ICD-10-CM

## 2018-05-29 DIAGNOSIS — F331 Major depressive disorder, recurrent, moderate: Secondary | ICD-10-CM

## 2018-05-29 MED ORDER — PAROXETINE HCL 20 MG PO TABS: 20.0000 mg | ORAL_TABLET | Freq: Every day | ORAL | 1 refills | Status: DC

## 2018-05-29 MED ORDER — BUSPIRONE HCL 10 MG PO TABS: 10.0000 mg | ORAL_TABLET | Freq: Two times a day (BID) | ORAL | 1 refills | Status: DC

## 2018-05-29 MED ORDER — TRAZODONE HCL 100 MG PO TABS: 100.0000 mg | ORAL_TABLET | Freq: Every day | ORAL | 2 refills | Status: DC

## 2018-05-29 NOTE — Patient Instructions (Addendum)
Please continue to drink ensure or boost daily for nutrition.   The Barium Swallow test has been scheduled for 06/02/2018 @ 9 am at [facility]. Nothing to eat or drink after 6 am the morning of your test.  You may go directly across the road today to have your chest xray done at Outpatient imaging.   Please see your appointment listed below.

## 2018-05-29 NOTE — Progress Notes (Signed)
Outpatient Surgical Follow Up  05/29/2018  Chelsey [name] is an 68 y.o. female.   Chief Complaint  Patient presents with  . Routine Post Op    Robotic Hiatal Hernia repair    HPI: s/p paraesophageal hernia w esophageal perf and fundoplications and GJ tube.Main issue is social. She is taking po, overall improvement in her condition .  She is now 30 and has lost 2 pounds.  She is competent and is wishing for me to remove the feeding tube.  A has been challenging to interact with Chelsey [name] given some of her cognitive deficit and lack of social and family support.  She comes with her sister which has been very supportive throughout her health issues.  He is doing 2 cans of Ensure a day provided by the sister.  Apparently she is still unable to use that left shoulder. He also understands that if we remove the feeding tube she may continue to lose weight as well as increase her chances of potential complications.  She is willing to take those risk.  After extensive counseling to the sister and the patient we agreed that we will obtain a chest x-ray and a barium swallow to make sure everything is going well.  She continues to have some dysphagia. No feves or chills.     Past Medical History:  Diagnosis Date  . Breast cancer ([place]) 1997   right breast, radiation  . Bronchitis    recent/ 06/09/15 [facility]/resolved  . Cancer [facility]) 1997   right lumpectomy,L/Ad/R   . GERD (gastroesophageal reflux disease)   . History of hiatal hernia   . Hypercholesteremia   . Hyperlipidemia   . Low BP    TYPICALLY RUNS 80'S/60'S  . Panic attack   . Personal history of malignant neoplasm of breast     Past Surgical History:  Procedure Laterality Date  . BICEPT TENODESIS Left 11/23/2016   Procedure: BICEPS TENODESIS;  Surgeon: Leim Fabry, MD;  Location: [facility];  Service: Orthopedics;  Laterality: Left;  . BREAST EXCISIONAL BIOPSY Right 1997   pos  . BREAST SURGERY Right  1997   lumpectomy  . CHOLECYSTECTOMY N/A 08/06/2016   Procedure: LAPAROSCOPIC CHOLECYSTECTOMY WITH INTRAOPERATIVE CHOLANGIOGRAM;  Surgeon: Christene Lye, MD;  Location: [facility];  Service: General;  Laterality: N/A;  . COLONOSCOPY  2008  . COLONOSCOPY WITH PROPOFOL N/A 07/21/2015   Procedure: COLONOSCOPY WITH PROPOFOL;  Surgeon: Lucilla Lame, MD;  Location: [place];  Service: Endoscopy;  Laterality: N/A;  . COLONOSCOPY WITH PROPOFOL N/A 01/05/2018   Procedure: COLONOSCOPY WITH PROPOFOL;  Surgeon: Jonathon Bellows, MD;  Location: [facility] ENDOSCOPY;  Service: Gastroenterology;  Laterality: N/A;  . ESOPHAGOGASTRODUODENOSCOPY (EGD) WITH PROPOFOL N/A 06/16/2016   Procedure: ESOPHAGOGASTRODUODENOSCOPY (EGD) WITH PROPOFOL;  Surgeon: Christene Lye, MD;  Location: [facility] ENDOSCOPY;  Service: Endoscopy;  Laterality: N/A;  . ESOPHAGOGASTRODUODENOSCOPY (EGD) WITH PROPOFOL N/A 01/05/2018   Procedure: ESOPHAGOGASTRODUODENOSCOPY (EGD) WITH PROPOFOL;  Surgeon: Jonathon Bellows, MD;  Location: [facility] ENDOSCOPY;  Service: Gastroenterology;  Laterality: N/A;  . HARDWARE REMOVAL Left 12/27/2016   Procedure: HARDWARE REMOVAL LEFT  PROXIMAL HUMEROUS;  Surgeon: Leim Fabry, MD;  Location: [facility];  Service: Orthopedics;  Laterality: Left;  . IR GJ TUBE CHANGE  04/21/2018  . NISSEN FUNDOPLICATION N/A 5/95/6387   Procedure: NISSEN FUNDOPLICATION;  Surgeon: Jules Husbands, MD;  Location: [facility];  Service: General;  Laterality: N/A;  . ORIF HUMERUS FRACTURE Left 11/23/2016   Procedure: OPEN  REDUCTION INTERNAL FIXATION (ORIF) PROXIMAL HUMERUS FRACTURE;  Surgeon: Leim Fabry, MD;  Location: [facility];  Service: Orthopedics;  Laterality: Left;  . REPAIR OF ESOPHAGUS  03/09/2018   Procedure: REPAIR OF ESOPHAGUS;  Surgeon: Jules Husbands, MD;  Location: [facility];  Service: General;;  . REVERSE SHOULDER ARTHROPLASTY Left 12/27/2016   Procedure: REVERSE SHOULDER ARTHROPLASTY;  Surgeon: Leim Fabry, MD;  Location:  [facility];  Service: Orthopedics;  Laterality: Left;  . ROBOTIC ASSISTED LAPAROSCOPIC REPAIR OF PARAESOPHAGEAL HERNIA N/A 03/09/2018   Procedure: ROBOTIC HIATAL HERNIA CONVERTED TO OPEN;  Surgeon: Jules Husbands, MD;  Location: [facility];  Service: General;  Laterality: N/A;    Family History  Problem Relation Age of Onset  . Anxiety disorder Brother   . Obesity Brother   . COPD Brother   . Kidney disease Mother   . Heart attack Father   . Hypertension Father   . Alcohol abuse Father   . Depression Brother   . Anxiety disorder Brother   . Breast cancer Maternal Grandmother     Social History:  reports that she has quit smoking. Her smoking use included cigarettes. She started smoking about 8 years ago. She has a 2.50 pack-year smoking history. She has never used smokeless tobacco. She reports that she does not drink alcohol or use drugs.  Allergies: No Known Allergies  Medications reviewed.    ROS Full ROS performed and is otherwise negative other than what is stated in HPI   BP (!) 80/50   Pulse 95   Temp 97.7 F (36.5 C) (Temporal)   Resp 12   Ht 5\' 5"  (1.651 m)   Wt 112 lb 3.2 oz (50.9 kg)   LMP  (LMP Unknown)   SpO2 98%   BMI 18.67 kg/m   Physical Exam Vitals signs and nursing note reviewed.  Constitutional:      Appearance: Normal appearance.  Pulmonary:     Effort: Pulmonary effort is normal.     Breath sounds: No stridor.  Abdominal:     General: Abdomen is flat. There is no distension.     Palpations: There is no mass.     Tenderness: There is no abdominal tenderness.     Comments: Feeding tube in place, no infection, some irritation on the LLQ from where the tube was taped. Small opening midline incision healing well. Scant fluid  Expressed.  No peritonitis.  Skin:    Capillary Refill: Capillary refill takes less than 2 seconds.  Neurological:     General: No focal deficit present.     Mental Status: She is alert.  Psychiatric:        Mood and  Affect: Mood normal.        Behavior: Behavior normal.        Thought Content: Thought content normal.        Judgment: Judgment normal.    Assessment/Plan:  1. S/P repair of paraesophageal hernia Failure to thrive and malnutrition. D/W the pt in detail.  She is competent I do not think that removing her feeding tube is in her best interest.  She is adamant that she wants to have it done. After a lengthy conversation with the patient she decided that she is going to let me check a chest x-ray and barium swallow to see if there is anything we can do for her dysphasia to hopefully increase her p.o. intake.  we have done multiple CT scans x-rays and barium swallows in the past  and these have failed to reveal any evidence of any leaks or any major abnormalities other than a small mediastinal seroma that is expected after large paraesophageal hernia. I will see her bak in 1 week after studies are completed. Encourage to continue to use ensure up to 3-4 cans a day.  Caroleen Hamman, MD [facility] General Surgeon

## 2018-05-29 NOTE — Progress Notes (Signed)
Patient ID: Chelsey [name], female   DOB: [birth year], 67 y.o.   MRN: [id number]    Patient is a 68 year old female with history of depression and anxiety who was seen for follow-up. She reported that she has been having difficulty eating and sustaining her weight after she has the feeding tube. She reported that her sister has been helping her and is supportive. She reported that she gains a few pounds and then she will lose weight as she will start throwing up. Patient reported that she is also having continued pain in her shoulder as she was unable to continue her therapy due to Covid. She reported that she has been spending most of the time in her house but she is planning to go out today. She appeared calm and alert during the interview. Patient reported that she sleeps well with the help of the trazodone. She does not want to change any medications at this time. Patient currently denied having any suicidal or homicidal ideations or plans.    Plan. Patient will continue her medications as prescribed. Follow up in six weeks earlier depending on her symptoms. No change in medications.  I discussed the assessment and treatment plan with the patient. The patient was provided an opportunity to ask questions and all were answered. The patient agreed with the plan and demonstrated an understanding of the instructions.   The patient was advised to call back or seek an in-person evaluation if the symptoms worsen or if the condition fails to improve as anticipated.   I provided 15 minutes of -face-to-face time during this encounter

## 2018-05-30 ENCOUNTER — Telehealth: Payer: Self-pay | Admitting: *Deleted

## 2018-05-30 NOTE — Telephone Encounter (Signed)
Notified patient as instructed, patient pleased. Discussed follow-up appointments, patient agrees  

## 2018-05-30 NOTE — Telephone Encounter (Signed)
-----   Message from Jules Husbands, MD sent at 05/29/2018  3:36 PM EDT ----- Please let her know thay her CXr is completely normal ----- Message ----- From: Interface, Rad Results In Sent: 05/29/2018  12:37 PM EDT To: Jules Husbands, MD

## 2018-06-02 ENCOUNTER — Ambulatory Visit
Admission: RE | Admit: 2018-06-02 | Discharge: 2018-06-02 | Disposition: A | Discharge status: Home / Self Care | Payer: Federal, State, Local not specified - PPO | Source: Ambulatory Visit | Attending: Surgery | Admitting: Surgery

## 2018-06-02 ENCOUNTER — Other Ambulatory Visit: Payer: Self-pay

## 2018-06-02 DIAGNOSIS — Z9889 Other specified postprocedural states: Secondary | ICD-10-CM | POA: Diagnosis not present

## 2018-06-02 DIAGNOSIS — Z8719 Personal history of other diseases of the digestive system: Secondary | ICD-10-CM | POA: Insufficient documentation

## 2018-06-05 ENCOUNTER — Encounter: Payer: Self-pay | Admitting: Surgery

## 2018-06-05 ENCOUNTER — Other Ambulatory Visit: Payer: Self-pay

## 2018-06-05 ENCOUNTER — Ambulatory Visit (INDEPENDENT_AMBULATORY_CARE_PROVIDER_SITE_OTHER): Payer: Federal, State, Local not specified - PPO | Admitting: Surgery

## 2018-06-05 VITALS — BP 130/72 | HR 72 | Temp 97.3°F | Ht 65.0 in | Wt 114.0 lb

## 2018-06-05 DIAGNOSIS — R1310 Dysphagia, unspecified: Secondary | ICD-10-CM

## 2018-06-05 NOTE — Progress Notes (Signed)
Outpatient Surgical Follow Up  06/05/2018  Chelsey [name] is an 68 y.o. female.   Chief Complaint  Patient presents with  . Routine Post Op    HPI: s/p paraesophageal hernia w esophageal perf and fundoplications and GJ tube.Main issue is social. She is taking po,. . She is Able to eat scramble eggs, tuna, cereal. She is taking ensure as well and is getting two cans of TF a day. HE weight today is 114 which is 2 pounds higher than last week.  She has some intermittent constipation No fevers or chills Swallow study pers. Reviewed showing distal esophageal narrowing at the fundoplication.. Barium tablet passes. No evidence of leak. No hiatal hernia    Past Medical History:  Diagnosis Date  . Breast cancer ([place]) 1997   right breast, radiation  . Bronchitis    recent/ 06/09/15 [facility]/ [facility]/resolved  . Cancer [facility]) 1997   right lumpectomy,L/Ad/R   . GERD (gastroesophageal reflux disease)   . History of hiatal hernia   . Hypercholesteremia   . Hyperlipidemia   . Low BP    TYPICALLY RUNS 80'S/60'S  . Panic attack   . Personal history of malignant neoplasm of breast     Past Surgical History:  Procedure Laterality Date  . BICEPT TENODESIS Left 11/23/2016   Procedure: BICEPS TENODESIS;  Surgeon: Leim Fabry, MD;  Location: [facility];  Service: Orthopedics;  Laterality: Left;  . BREAST EXCISIONAL BIOPSY Right 1997   pos  . BREAST SURGERY Right 1997   lumpectomy  . CHOLECYSTECTOMY N/A 08/06/2016   Procedure: LAPAROSCOPIC CHOLECYSTECTOMY WITH INTRAOPERATIVE CHOLANGIOGRAM;  Surgeon: Christene Lye, MD;  Location: [facility];  Service: General;  Laterality: N/A;  . COLONOSCOPY  2008  . COLONOSCOPY WITH PROPOFOL N/A 07/21/2015   Procedure: COLONOSCOPY WITH PROPOFOL;  Surgeon: Lucilla Lame, MD;  Location: [place];  Service: Endoscopy;  Laterality: N/A;  . COLONOSCOPY WITH PROPOFOL N/A 01/05/2018   Procedure: COLONOSCOPY WITH PROPOFOL;  Surgeon:  Jonathon Bellows, MD;  Location: [facility] ENDOSCOPY;  Service: Gastroenterology;  Laterality: N/A;  . ESOPHAGOGASTRODUODENOSCOPY (EGD) WITH PROPOFOL N/A 06/16/2016   Procedure: ESOPHAGOGASTRODUODENOSCOPY (EGD) WITH PROPOFOL;  Surgeon: Christene Lye, MD;  Location: [facility] ENDOSCOPY;  Service: Endoscopy;  Laterality: N/A;  . ESOPHAGOGASTRODUODENOSCOPY (EGD) WITH PROPOFOL N/A 01/05/2018   Procedure: ESOPHAGOGASTRODUODENOSCOPY (EGD) WITH PROPOFOL;  Surgeon: Jonathon Bellows, MD;  Location: [facility] ENDOSCOPY;  Service: Gastroenterology;  Laterality: N/A;  . HARDWARE REMOVAL Left 12/27/2016   Procedure: HARDWARE REMOVAL LEFT  PROXIMAL HUMEROUS;  Surgeon: Leim Fabry, MD;  Location: [facility];  Service: Orthopedics;  Laterality: Left;  . IR GJ TUBE CHANGE  04/21/2018  . NISSEN FUNDOPLICATION N/A 06/26/930   Procedure: NISSEN FUNDOPLICATION;  Surgeon: Jules Husbands, MD;  Location: [facility];  Service: General;  Laterality: N/A;  . ORIF HUMERUS FRACTURE Left 11/23/2016   Procedure: OPEN REDUCTION INTERNAL FIXATION (ORIF) PROXIMAL HUMERUS FRACTURE;  Surgeon: Leim Fabry, MD;  Location: [facility];  Service: Orthopedics;  Laterality: Left;  . REPAIR OF ESOPHAGUS  03/09/2018   Procedure: REPAIR OF ESOPHAGUS;  Surgeon: Jules Husbands, MD;  Location: [facility];  Service: General;;  . REVERSE SHOULDER ARTHROPLASTY Left 12/27/2016   Procedure: REVERSE SHOULDER ARTHROPLASTY;  Surgeon: Leim Fabry, MD;  Location: [facility];  Service: Orthopedics;  Laterality: Left;  . ROBOTIC ASSISTED LAPAROSCOPIC REPAIR OF PARAESOPHAGEAL HERNIA N/A 03/09/2018   Procedure: ROBOTIC HIATAL HERNIA CONVERTED TO OPEN;  Surgeon: Jules Husbands, MD;  Location: [facility];  Service: General;  Laterality: N/A;    Family History  Problem Relation Age of Onset  . Anxiety disorder Brother   . Obesity Brother   . COPD Brother   . Kidney disease Mother   . Heart attack Father   . Hypertension Father   . Alcohol abuse Father   . Depression Brother   .  Anxiety disorder Brother   . Breast cancer Maternal Grandmother     Social History:  reports that she has quit smoking. Her smoking use included cigarettes. She started smoking about 8 years ago. She has a 2.50 pack-year smoking history. She has never used smokeless tobacco. She reports that she does not drink alcohol or use drugs.  Allergies: No Known Allergies  Medications reviewed.    ROS Full ROS performed and is otherwise negative other than what is stated in HPI   BP 130/72   Pulse 72   Temp (!) 97.3 F (36.3 C) (Skin)   Ht 5\' 5"  (1.651 m)   Wt 114 lb (51.7 kg)   LMP  (LMP Unknown)   SpO2 98%   BMI 18.97 kg/m   Physical Exam Debilitated female in NAD Abd: soft, nt, feeding tube in place. No infection. 2 mm wound midline healing well. No peritonitis Ext: no edema and well perfused  Assessment/Plan: 1. Dysphagia,due to narrowing GE junction, Scar vs fundoplication. D/W Dr. Vicente Males and we will arrange for EGD and possible dilation. She is three months out of her repair and it should be safe for her to undergo dilation. D/W Dr. Genevive Bi as well and he agrees with me. We will hold off of any removal of feeding tube until  There is no evidence of food impaction, she may continue w soft diet  - Ambulatory referral to Gastroenterology     Caroleen Hamman, MD [place] Surgeon

## 2018-06-05 NOTE — Patient Instructions (Signed)
We will refer you to Dr Vicente Males at [place] for an Upper Endoscopy and dilation. You will follow up here after that has been completed.

## 2018-06-06 ENCOUNTER — Other Ambulatory Visit: Payer: Self-pay

## 2018-06-06 DIAGNOSIS — Z01812 Encounter for preprocedural laboratory examination: Secondary | ICD-10-CM

## 2018-06-06 DIAGNOSIS — R1310 Dysphagia, unspecified: Secondary | ICD-10-CM

## 2018-06-06 NOTE — Progress Notes (Signed)
Spoke with pt to schedule endoscopy procedure. Pt is aware of requirement to have COVID-19 lab test completed 3 days prior to procedure. Pt is aware of COVID lab test collection site, day, and time.

## 2018-06-07 ENCOUNTER — Telehealth: Payer: Self-pay | Admitting: Gastroenterology

## 2018-06-07 NOTE — Telephone Encounter (Signed)
Spoke with pt and was able to address her questions regarding the procedure.

## 2018-06-07 NOTE — Telephone Encounter (Signed)
Patient l/m on v/m asking for [facility] to call she has question's on her procedure.

## 2018-06-09 ENCOUNTER — Other Ambulatory Visit: Payer: Self-pay

## 2018-06-09 ENCOUNTER — Other Ambulatory Visit
Admission: RE | Admit: 2018-06-09 | Discharge: 2018-06-09 | Disposition: A | Discharge status: Home / Self Care | Payer: Federal, State, Local not specified - PPO | Source: Ambulatory Visit | Attending: Gastroenterology | Admitting: Gastroenterology

## 2018-06-09 DIAGNOSIS — Z1159 Encounter for screening for other viral diseases: Secondary | ICD-10-CM | POA: Diagnosis not present

## 2018-06-11 LAB — NOVEL CORONAVIRUS, NAA (HOSP ORDER, SEND-OUT TO REF LAB; TAT 18-24 HRS): SARS-CoV-2, NAA: NOT DETECTED

## 2018-06-12 ENCOUNTER — Encounter: Payer: Self-pay | Admitting: *Deleted

## 2018-06-13 ENCOUNTER — Ambulatory Visit: Payer: Federal, State, Local not specified - PPO | Admitting: Anesthesiology

## 2018-06-13 ENCOUNTER — Encounter
Admission: RE | Disposition: A | Discharge status: Home / Self Care | Payer: Self-pay | Source: Home / Self Care | Attending: Gastroenterology

## 2018-06-13 ENCOUNTER — Ambulatory Visit
Admission: RE | Admit: 2018-06-13 | Discharge: 2018-06-13 | Disposition: A | Discharge status: Home / Self Care | Payer: Federal, State, Local not specified - PPO | Attending: Gastroenterology | Admitting: Gastroenterology

## 2018-06-13 ENCOUNTER — Encounter: Payer: Self-pay | Admitting: *Deleted

## 2018-06-13 DIAGNOSIS — E7800 Pure hypercholesterolemia, unspecified: Secondary | ICD-10-CM | POA: Diagnosis not present

## 2018-06-13 DIAGNOSIS — K219 Gastro-esophageal reflux disease without esophagitis: Secondary | ICD-10-CM | POA: Insufficient documentation

## 2018-06-13 DIAGNOSIS — E785 Hyperlipidemia, unspecified: Secondary | ICD-10-CM | POA: Insufficient documentation

## 2018-06-13 DIAGNOSIS — Z79899 Other long term (current) drug therapy: Secondary | ICD-10-CM | POA: Diagnosis not present

## 2018-06-13 DIAGNOSIS — Z853 Personal history of malignant neoplasm of breast: Secondary | ICD-10-CM | POA: Insufficient documentation

## 2018-06-13 DIAGNOSIS — Z931 Gastrostomy status: Secondary | ICD-10-CM | POA: Diagnosis not present

## 2018-06-13 DIAGNOSIS — F17210 Nicotine dependence, cigarettes, uncomplicated: Secondary | ICD-10-CM | POA: Diagnosis not present

## 2018-06-13 DIAGNOSIS — K222 Esophageal obstruction: Secondary | ICD-10-CM | POA: Diagnosis not present

## 2018-06-13 DIAGNOSIS — F419 Anxiety disorder, unspecified: Secondary | ICD-10-CM | POA: Diagnosis not present

## 2018-06-13 DIAGNOSIS — F329 Major depressive disorder, single episode, unspecified: Secondary | ICD-10-CM | POA: Diagnosis not present

## 2018-06-13 DIAGNOSIS — R1310 Dysphagia, unspecified: Secondary | ICD-10-CM | POA: Insufficient documentation

## 2018-06-13 HISTORY — PX: ESOPHAGOGASTRODUODENOSCOPY (EGD) WITH PROPOFOL: SHX5813

## 2018-06-13 SURGERY — ESOPHAGOGASTRODUODENOSCOPY (EGD) WITH PROPOFOL
Anesthesia: General

## 2018-06-13 MED ORDER — PROPOFOL 500 MG/50ML IV EMUL
INTRAVENOUS | Status: DC | PRN
  Administered 2018-06-13: 100 ug/kg/min via INTRAVENOUS

## 2018-06-13 MED ORDER — FENTANYL CITRATE (PF) 100 MCG/2ML IJ SOLN
INTRAMUSCULAR | Status: AC
  Filled 2018-06-13: qty 2

## 2018-06-13 MED ORDER — SODIUM CHLORIDE 0.9 % IV SOLN
INTRAVENOUS | Status: DC
  Administered 2018-06-13: 14:00:00 via INTRAVENOUS

## 2018-06-13 MED ORDER — MIDAZOLAM HCL 2 MG/2ML IJ SOLN
INTRAMUSCULAR | Status: AC
  Filled 2018-06-13: qty 2

## 2018-06-13 MED ORDER — PROPOFOL 10 MG/ML IV BOLUS
INTRAVENOUS | Status: DC | PRN
  Administered 2018-06-13: 50 mg via INTRAVENOUS

## 2018-06-13 MED ORDER — PHENYLEPHRINE HCL (PRESSORS) 10 MG/ML IV SOLN
INTRAVENOUS | Status: DC | PRN
  Administered 2018-06-13 (×5): 100 ug via INTRAVENOUS

## 2018-06-13 NOTE — H&P (Signed)
Jonathon Bellows, MD [address] [place], [place], [place], [address] [address], [place], [place], [place], [address] Phone: [phone]  Fax: [phone]  Primary Care Physician:  Guadalupe Maple, MD   Pre-Procedure History & Physical: HPI:  Chelsey [name] is a 68 y.o. female is here for an endoscopy    Past Medical History:  Diagnosis Date  . Breast cancer ([place]) 1997   right breast, radiation  . Bronchitis    recent/ 06/09/15 had chest xray/ [facility]/resolved  . Cancer [facility]) 1997   right lumpectomy,L/Ad/R   . GERD (gastroesophageal reflux disease)   . History of hiatal hernia   . Hypercholesteremia   . Hyperlipidemia   . Low BP    TYPICALLY RUNS 80'S/60'S  . Panic attack   . Personal history of malignant neoplasm of breast     Past Surgical History:  Procedure Laterality Date  . BICEPT TENODESIS Left 11/23/2016   Procedure: BICEPS TENODESIS;  Surgeon: Leim Fabry, MD;  Location: [facility];  Service: Orthopedics;  Laterality: Left;  . BREAST EXCISIONAL BIOPSY Right 1997   pos  . BREAST SURGERY Right 1997   lumpectomy  . CHOLECYSTECTOMY N/A 08/06/2016   Procedure: LAPAROSCOPIC CHOLECYSTECTOMY WITH INTRAOPERATIVE CHOLANGIOGRAM;  Surgeon: Christene Lye, MD;  Location: [facility];  Service: General;  Laterality: N/A;  . COLONOSCOPY  2008  . COLONOSCOPY WITH PROPOFOL N/A 07/21/2015   Procedure: COLONOSCOPY WITH PROPOFOL;  Surgeon: Lucilla Lame, MD;  Location: [facility];  Service: Endoscopy;  Laterality: N/A;  . COLONOSCOPY WITH PROPOFOL N/A 01/05/2018   Procedure: COLONOSCOPY WITH PROPOFOL;  Surgeon: Jonathon Bellows, MD;  Location: [facility] ENDOSCOPY;  Service: Gastroenterology;  Laterality: N/A;  . ESOPHAGOGASTRODUODENOSCOPY (EGD) WITH PROPOFOL N/A 06/16/2016   Procedure: ESOPHAGOGASTRODUODENOSCOPY (EGD) WITH PROPOFOL;  Surgeon: Christene Lye, MD;  Location: [facility] ENDOSCOPY;  Service: Endoscopy;  Laterality: N/A;  .  ESOPHAGOGASTRODUODENOSCOPY (EGD) WITH PROPOFOL N/A 01/05/2018   Procedure: ESOPHAGOGASTRODUODENOSCOPY (EGD) WITH PROPOFOL;  Surgeon: Jonathon Bellows, MD;  Location: [facility] ENDOSCOPY;  Service: Gastroenterology;  Laterality: N/A;  . HARDWARE REMOVAL Left 12/27/2016   Procedure: HARDWARE REMOVAL LEFT  PROXIMAL HUMEROUS;  Surgeon: Leim Fabry, MD;  Location: [facility];  Service: Orthopedics;  Laterality: Left;  . IR GJ TUBE CHANGE  04/21/2018  . NISSEN FUNDOPLICATION N/A 2/33/0076   Procedure: NISSEN FUNDOPLICATION;  Surgeon: Jules Husbands, MD;  Location: [facility];  Service: General;  Laterality: N/A;  . ORIF HUMERUS FRACTURE Left 11/23/2016   Procedure: OPEN REDUCTION INTERNAL FIXATION (ORIF) PROXIMAL HUMERUS FRACTURE;  Surgeon: Leim Fabry, MD;  Location: [facility];  Service: Orthopedics;  Laterality: Left;  . REPAIR OF ESOPHAGUS  03/09/2018   Procedure: REPAIR OF ESOPHAGUS;  Surgeon: Jules Husbands, MD;  Location: [facility];  Service: General;;  . REVERSE SHOULDER ARTHROPLASTY Left 12/27/2016   Procedure: REVERSE SHOULDER ARTHROPLASTY;  Surgeon: Leim Fabry, MD;  Location: [facility];  Service: Orthopedics;  Laterality: Left;  . ROBOTIC ASSISTED LAPAROSCOPIC REPAIR OF PARAESOPHAGEAL HERNIA N/A 03/09/2018   Procedure: ROBOTIC HIATAL HERNIA CONVERTED TO OPEN;  Surgeon: Jules Husbands, MD;  Location: [facility];  Service: General;  Laterality: N/A;    Prior to Admission medications   Medication Sig Start Date End Date Taking? Authorizing Provider  busPIRone (BUSPAR) 10 MG tablet Take 1 tablet (10 mg total) by mouth 2 (two) times daily. 05/29/18  Yes Rainey Pines, MD  Calcium Carbonate-Vitamin D (CALCIUM + D PO) Take 1 tablet daily by  mouth.    Yes [provider]  Cholecalciferol (VITAMIN D) 2000 units tablet Take 2,000 Units by mouth daily.   Yes [provider]  cyclobenzaprine (FLEXERIL) 5 MG tablet Take 1 tablet (5 mg total) by mouth 3 (three) times daily as needed for muscle spasms. 04/04/18   Yes Tylene Fantasia, PA-C  ibuprofen (ADVIL,MOTRIN) 200 MG tablet Take 200-400 mg by mouth daily as needed for headache or moderate pain.   Yes [provider]  Multiple Vitamin (MULTIVITAMIN) tablet Take 1 tablet daily by mouth.    Yes [provider]  omeprazole (PRILOSEC) 20 MG capsule Take 1 capsule (20 mg total) by mouth 2 (two) times daily before a meal. 07/27/17  Yes Kathrine Haddock, NP  PARoxetine (PAXIL) 20 MG tablet Take 1 tablet (20 mg total) by mouth daily before supper. 05/29/18  Yes Rainey Pines, MD  Simethicone (GAS-X PO) Take 1-2 tablets by mouth daily as needed (gas).   Yes [provider]  simvastatin (ZOCOR) 20 MG tablet TAKE ONE TABLET BY MOUTH AT BEDTIME 12/16/17  Yes Kathrine Haddock, NP  traZODone (DESYREL) 100 MG tablet Take 1 tablet (100 mg total) by mouth at bedtime. 05/29/18  Yes Rainey Pines, MD  HYDROcodone-acetaminophen (HYCET) 7.5-325 mg/15 ml solution Take 10 mLs by mouth every 6 (six) hours as needed for moderate pain. Patient not taking: Reported on 06/13/2018 04/18/18 04/18/19  Olean Ree, MD  lidocaine (XYLOCAINE) 5 % ointment Apply 1 application topically 3 (three) times daily as needed. Patient not taking: Reported on 06/13/2018 04/18/18   Olean Ree, MD  metoCLOPramide (REGLAN) 5 MG tablet Take 1 tablet (5 mg total) by mouth 2 (two) times daily. Patient not taking: Reported on 06/13/2018 05/01/18   Caroleen Hamman F, MD  Nutritional Supplements (FEEDING SUPPLEMENT, OSMOLITE 1.5 CAL,) LIQD Place 237 mLs into feeding tube 4 (four) times daily. 04/04/18   Tylene Fantasia, PA-C  Peppermint Oil (IBGARD PO) Take 2 tablets by mouth daily before supper.    [provider]    Allergies as of 06/06/2018  . (No Known Allergies)    Family History  Problem Relation Age of Onset  . Anxiety disorder Brother   . Obesity Brother   . COPD Brother   . Kidney disease Mother   . Heart attack Father   . Hypertension Father   . Alcohol abuse  Father   . Depression Brother   . Anxiety disorder Brother   . Breast cancer Maternal Grandmother     Social History   Socioeconomic History  . Marital status: Widowed    Spouse name: Not on file  . Number of children: 2  . Years of education: Not on file  . Highest education level: Bachelor's degree (e.g., BA, AB, BS)  Occupational History  . Not on file  Social Needs  . Financial resource strain: Hard  . Food insecurity:    Worry: Never true    Inability: Never true  . Transportation needs:    Medical: No    Non-medical: No  Tobacco Use  . Smoking status: Current Some Day Smoker    Packs/day: 0.25    Years: 10.00    Pack years: 2.50    Types: Cigarettes    Start date: 11/25/2009  . Smokeless tobacco: Never Used  . Tobacco comment: 1 cigarette daily  Substance and Sexual Activity  . Alcohol use: No    Alcohol/week: 0.0 standard drinks  . Drug use: No  . Sexual activity:  Never  Lifestyle  . Physical activity:    Days per week: 0 days    Minutes per session: 0 min  . Stress: Very much  Relationships  . Social connections:    Talks on phone: Not on file    Gets together: Not on file    Attends religious service: Never    Active member of club or organization: No    Attends meetings of clubs or organizations: Never    Relationship status: Widowed  . Intimate partner violence:    Fear of current or ex partner: No    Emotionally abused: No    Physically abused: No    Forced sexual activity: No  Other Topics Concern  . Not on file  Social History Narrative  . Not on file    Review of Systems: See HPI, otherwise negative ROS  Physical Exam: BP (!) 84/61   Pulse 84   Temp (!) 96.4 F (35.8 C) (Tympanic)   Resp 16   Ht 5\' 5"  (1.651 m)   Wt 51.7 kg   LMP  (LMP Unknown)   SpO2 100%   BMI 18.97 kg/m  General:   Alert,  pleasant and cooperative in NAD Head:  Normocephalic and atraumatic. Neck:  Supple; no masses or thyromegaly. Lungs:  Clear  throughout to auscultation, normal respiratory effort.    Heart:  +S1, +S2, Regular rate and rhythm, No edema. Abdomen:  Soft, nontender and nondistended. Normal bowel sounds, without guarding, and without rebound.G tube in place, midline verticle scar   Neurologic:  Alert and  oriented x4;  grossly normal neurologically.  Impression/Plan: Chelsey [name] is here for an endoscopy  to be performed for  evaluation of dysphagia    Risks, benefits, limitations, and alternatives regarding endoscopy and dilation have been reviewed with the patient.  Questions have been answered.  All parties agreeable.   Jonathon Bellows, MD  06/13/2018, 1:31 PM

## 2018-06-13 NOTE — Anesthesia Preprocedure Evaluation (Signed)
Anesthesia Evaluation  Patient identified by MRN, date of birth, ID band Patient awake    Reviewed: Allergy & Precautions, NPO status , Patient's Chart, lab work & pertinent test results  History of Anesthesia Complications Negative for: history of anesthetic complications  Airway Mallampati: II  TM Distance: >3 FB Neck ROM: Full    Dental  (+) Poor Dentition   Pulmonary neg sleep apnea, neg COPD, Current Smoker,    breath sounds clear to auscultation- rhonchi (-) wheezing      Cardiovascular Exercise Tolerance: Good (-) hypertension(-) CAD, (-) Past MI, (-) Cardiac Stents and (-) CABG negative cardio ROS   Rhythm:Regular Rate:Normal - Systolic murmurs and - Diastolic murmurs    Neuro/Psych neg Seizures PSYCHIATRIC DISORDERS Anxiety Depression negative neurological ROS     GI/Hepatic Neg liver ROS, hiatal hernia, GERD  ,  Endo/Other  negative endocrine ROSneg diabetes  Renal/GU negative Renal ROS     Musculoskeletal negative musculoskeletal ROS (+)   Abdominal (+) - obese,   Peds negative pediatric ROS (+)  Hematology  (+) anemia ,   Anesthesia Other Findings Past Medical History: 1997: Breast cancer ([place])     Comment:  right breast, radiation No date: Bronchitis     Comment:  recent/ 06/09/15 had [facility]/ [facility]/resolved 1997: Cancer [facility])     Comment:  right lumpectomy,L/Ad/R  No date: GERD (gastroesophageal reflux disease) No date: Hypercholesteremia No date: Hyperlipidemia No date: Panic attack No date: Personal history of malignant neoplasm of breast   Reproductive/Obstetrics                             Anesthesia Physical  Anesthesia Plan  ASA: II  Anesthesia Plan: General   Post-op Pain Management:    Induction: Intravenous  PONV Risk Score and Plan: 1 and Propofol infusion  Airway Management Planned: Natural Airway  Additional  Equipment:   Intra-op Plan:   Post-operative Plan:   Informed Consent: I have reviewed the patients History and Physical, chart, labs and discussed the procedure including the risks, benefits and alternatives for the proposed anesthesia with the patient or authorized representative who has indicated his/her understanding and acceptance.     Dental advisory given  Plan Discussed with: CRNA and Anesthesiologist  Anesthesia Plan Comments:         Anesthesia Quick Evaluation

## 2018-06-13 NOTE — Anesthesia Procedure Notes (Signed)
Date/Time: 06/13/2018 1:55 PM Performed by: Nelda Marseille, CRNA Pre-anesthesia Checklist: Patient identified, Emergency Drugs available, Suction available, Patient being monitored and Timeout performed Oxygen Delivery Method: Nasal cannula

## 2018-06-13 NOTE — Op Note (Signed)
[facility] Patient Name: Chelsey [name] Procedure Date: 06/13/2018 1:44 PM MRN: [id number] Account #: [id number] Date of Birth: August 04, 1950 Admit Type: Outpatient Age: 68 Room: [facility] ENDO ROOM 4 Gender: Female Note Status: Finalized Procedure:            Upper GI endoscopy Indications:          Dysphagia Providers:            Jonathon Bellows MD, MD Referring MD:         Guadalupe Maple, MD (Referring MD) Medicines:            Monitored Anesthesia Care Complications:        No immediate complications. Procedure:            Pre-Anesthesia Assessment:                       - Prior to the procedure, a History and Physical was                        performed, and patient medications, allergies and                        sensitivities were reviewed. The patient's tolerance of                        previous anesthesia was reviewed.                       - The risks and benefits of the procedure and the                        sedation options and risks were discussed with the                        patient. All questions were answered and informed                        consent was obtained.                       - ASA Grade Assessment: III - A patient with severe                        systemic disease.                       After obtaining informed consent, the endoscope was                        passed under direct vision. Throughout the procedure,                        the patient's blood pressure, pulse, and oxygen                        saturations were monitored continuously. The Endoscope                        was introduced through the mouth, and advanced to the  third part of duodenum. The upper GI endoscopy was                        accomplished with ease. The patient tolerated the                        procedure well. Findings:      One benign-appearing, intrinsic severe (stenosis; an endoscope cannot       pass)  stenosis was found at the gastroesophageal junction. This stenosis       measured 2 cm (in length). The stenosis was traversed after downsizing       scope. The lesion was not amenable to dilation, and this was not       attempted.      A gastric tube with tip in the jejunum was found in the gastric body.      The examined duodenum was normal.      The cardia and gastric fundus were normal on retroflexion. Impression:           - Benign-appearing esophageal stenosis. Lesion not                        amenable to dilation, and not attempted.                       - A gastric tube with tip in the jejunum was found in                        the stomach.                       - Normal examined duodenum.                       - No specimens collected. Recommendation:       - Discharge patient to home (with escort).                       - Resume previous diet.                       - Continue present medications.                       - [facility]referral to [facility] for attempt at dilation, very                        tight GE junction likely due to tight external wrap,                        previous location of esophageal perforation. May not be                        amenable to dilate from present size lumen to 15 mm. If                        attemnpted needs to be at a center where they have a                        covered esophageal stent redeally available. Procedure Code(s):    ---  Professional ---                       [phone], Esophagogastroduodenoscopy, flexible, transoral;                        diagnostic, including collection of specimen(s) by                        brushing or washing, when performed (separate procedure) Diagnosis Code(s):    --- Professional ---                       K22.2, Esophageal obstruction                       Z93.1, Gastrostomy status                       R13.10, Dysphagia, unspecified CPT copyright 2019 American Medical Association. All rights  reserved. The codes documented in this report are preliminary and upon coder review may  be revised to meet current compliance requirements. Jonathon Bellows, MD Jonathon Bellows MD, MD 06/13/2018 2:09:23 PM This report has been signed electronically. Number of Addenda: 0 Note Initiated On: 06/13/2018 1:44 PM      [facility]

## 2018-06-13 NOTE — Anesthesia Post-op Follow-up Note (Signed)
Anesthesia QCDR form completed.        

## 2018-06-13 NOTE — Anesthesia Postprocedure Evaluation (Signed)
Anesthesia Post Note  Patient: Chelsey [name]  Procedure(s) Performed: ESOPHAGOGASTRODUODENOSCOPY (EGD) WITH PROPOFOL (N/A )  Patient location during evaluation: Endoscopy Anesthesia Type: General Level of consciousness: awake and alert and oriented Pain management: pain level controlled Vital Signs Assessment: post-procedure vital signs reviewed and stable Respiratory status: spontaneous breathing Cardiovascular status: blood pressure returned to baseline Anesthetic complications: no     Last Vitals:  Vitals:   06/13/18 1431 06/13/18 1441  BP: 104/72 98/70  Pulse: 77 73  Resp: 19 12  Temp:    SpO2: 100% 100%    Last Pain:  Vitals:   06/13/18 1431  TempSrc:   PainSc: 0-No pain                 Umaiza Matusik

## 2018-06-13 NOTE — Transfer of Care (Signed)
Immediate Anesthesia Transfer of Care Note  Patient: Chelsey [name]  Procedure(s) Performed: ESOPHAGOGASTRODUODENOSCOPY (EGD) WITH PROPOFOL (N/A )  Patient Location: PACU  Anesthesia Type:General  Level of Consciousness: sedated  Airway & Oxygen Therapy: Patient Spontanous Breathing and Patient connected to face mask oxygen  Post-op Assessment: Report given to RN and Post -op Vital signs reviewed and stable  Post vital signs: Reviewed and stable  Last Vitals:  Vitals Value Taken Time  BP 93/43 06/13/2018  2:11 PM  Temp 36.2 C 06/13/2018  2:11 PM  Pulse 80 06/13/2018  2:12 PM  Resp 9 06/13/2018  2:12 PM  SpO2 100 % 06/13/2018  2:12 PM  Vitals shown include unvalidated device data.  Last Pain:  Vitals:   06/13/18 1411  TempSrc: Tympanic  PainSc: 0-No pain         Complications: No apparent anesthesia complications

## 2018-06-14 ENCOUNTER — Encounter: Payer: Self-pay | Admitting: Gastroenterology

## 2018-06-15 ENCOUNTER — Telehealth: Payer: Self-pay | Admitting: Gastroenterology

## 2018-06-15 NOTE — Telephone Encounter (Signed)
Pt is calling  Dr. Vicente Males  Send a referral to [facility]  and she was told by him if she has not heard anything in a couple days to call us please call p

## 2018-06-15 NOTE — Telephone Encounter (Signed)
Spoke with pt and informed her that [facility] will be contacting her to schedule. I explained that we will try to get the referral expedited.

## 2018-06-26 ENCOUNTER — Telehealth: Payer: Self-pay | Admitting: Gastroenterology

## 2018-06-26 NOTE — Telephone Encounter (Signed)
Patient called & wanted DR Anna's nurse to call her back regarding a referral.

## 2018-06-26 NOTE — Telephone Encounter (Signed)
Spoke with pt and informed her that I spoke with Rodney to check on the status of her referral, I was told that the referral has been reviewed and they will be contacting pt shortly. Pt understands. I also gave pt [facility]'s contact information incase she needs to reach out to them.

## 2018-06-27 ENCOUNTER — Other Ambulatory Visit: Payer: Self-pay

## 2018-06-27 MED ORDER — DICYCLOMINE HCL 10 MG PO CAPS: 10.0000 mg | ORAL_CAPSULE | Freq: Three times a day (TID) | ORAL | 0 refills | Status: DC

## 2018-06-27 NOTE — Telephone Encounter (Signed)
It could be from gas or even constipation at times. The feeding tube looked ok when we did the procedure. She could try bentyl if the pain persists 10 mg TID PRN

## 2018-06-27 NOTE — Telephone Encounter (Signed)
Spoke with pt and informed her of Dr. Georgeann Oppenheim advice. Pt agrees to commence on the Bentyl. Prescription has been sent to pt preferred pharmacy. Pt also stated that she has been scheduled for her endoscopy procedure at [facility] on 06-29-18.

## 2018-06-29 ENCOUNTER — Encounter: Payer: Self-pay | Admitting: Gastroenterology

## 2018-07-07 ENCOUNTER — Telehealth: Payer: Self-pay | Admitting: Surgery

## 2018-07-07 NOTE — Telephone Encounter (Signed)
Patient is calling and has a question about her tube said there is a hole in it and is asking what they need to do. Please call patient and advise.

## 2018-07-07 NOTE — Telephone Encounter (Signed)
Advised patient to put a pisces of tape around the hole.She will be saw on Monday

## 2018-07-10 ENCOUNTER — Other Ambulatory Visit: Payer: Self-pay

## 2018-07-10 ENCOUNTER — Ambulatory Visit (INDEPENDENT_AMBULATORY_CARE_PROVIDER_SITE_OTHER): Payer: Federal, State, Local not specified - PPO | Admitting: Surgery

## 2018-07-10 ENCOUNTER — Encounter: Payer: Self-pay | Admitting: Surgery

## 2018-07-10 VITALS — BP 89/62 | HR 81 | Temp 97.5°F | Ht 65.0 in | Wt 112.4 lb

## 2018-07-10 DIAGNOSIS — K9420 Gastrostomy complication, unspecified: Secondary | ICD-10-CM | POA: Diagnosis not present

## 2018-07-10 DIAGNOSIS — R1084 Generalized abdominal pain: Secondary | ICD-10-CM | POA: Diagnosis not present

## 2018-07-10 DIAGNOSIS — R634 Abnormal weight loss: Secondary | ICD-10-CM

## 2018-07-10 MED ORDER — OXYCODONE-ACETAMINOPHEN 7.5-325 MG PO TABS: 1.0000 | ORAL_TABLET | ORAL | 0 refills | Status: DC | PRN

## 2018-07-10 NOTE — Patient Instructions (Addendum)
We have scheduled you for a CT of the abdomen & pelvis at [place] on Wednesday June 24th at 9:00 am.  Chelsey [name] will need to arrive there by 8:45 am. You will need to pick up a prep kit today.  You will need to have nothing to eat or drink for 4 hours prior.   Afterwards we will arrange for a tube exchange.  We will refer you to [facility] to a GI surgery specialists.  We will refer you to Pain Management  Start taking your pain medication today.   Encourage to take Miralax daily with full glass of fluid.  Try lying on your right side during tube feedings.   Follow up here in 2 weeks.

## 2018-07-11 ENCOUNTER — Telehealth: Payer: Self-pay

## 2018-07-11 NOTE — Progress Notes (Signed)
Outpatient Surgical Follow Up  07/11/2018  Chelsey [name] is an 68 y.o. female.   Chief Complaint  Patient presents with  . Routine Post Op    HPI: s/p paraesophageal hernia w esophageal perf and fundoplications and GJ tube 03/09/18 Swallow study pers. Reviewed showing distal esophageal narrowing at the fundoplication.. Barium tablet passes. No evidence of leak. No hiatal hernia .  Undergo esophageal dilation at [facility] a couple weeks ago and she is scheduled to have another dilation 3 weeks from now.  She states that her swallowing is better after dilation but not completely normal. She is taking po. She isAble to eat scramble eggs, tuna, cereal.  She has some intermittent constipation No fevers or chills. Is accompanied by her sister and they are very frustrated because and the sister has some arthritis and is on able to feed her due to feels that she needs.  According to the sister she does not have the strength to place the tube feeds via the G-tube.  The patient has also a lot of social issues and now has chronic pain.  She reports that she experiences intermittent abdominal pain after she eats or after the G-tube is used.  The sister also noticed questionable leak within the jejunostomy portion of the tube.   Past Medical History:  Diagnosis Date  . Breast cancer ([place]) 1997   right breast, radiation  . Bronchitis    recent/ 06/09/15 [facility] chest x[facility]/resolved  . Cancer [facility]) 1997   right lumpectomy,L/Ad/R   . GERD (gastroesophageal reflux disease)   . History of hiatal hernia   . Hypercholesteremia   . Hyperlipidemia   . Low BP    TYPICALLY RUNS 80'S/60'S  . Panic attack   . Personal history of malignant neoplasm of breast     Past Surgical History:  Procedure Laterality Date  . BICEPT TENODESIS Left 11/23/2016   Procedure: BICEPS TENODESIS;  Surgeon: Leim Fabry, MD;  Location: [facility] ORS;  Service: Orthopedics;  Laterality: Left;  . BREAST EXCISIONAL  BIOPSY Right 1997   pos  . BREAST SURGERY Right 1997   lumpectomy  . CHOLECYSTECTOMY N/A 08/06/2016   Procedure: LAPAROSCOPIC CHOLECYSTECTOMY WITH INTRAOPERATIVE CHOLANGIOGRAM;  Surgeon: Christene Lye, MD;  Location: [facility] OR[facility];  Service: General;  Laterality: N/A;  . COLONOSCOPY  2008  . COLONOSCOPY WITH PROPOFOL N/A 07/21/2015   Procedure: COLONOSCOPY WITH PROPOFOL;  Surgeon: Lucilla Lame, MD;  Location: [place];  Service: Endoscopy;  Laterality: N/A;  . COLONOSCOPY WITH PROPOFOL N/A 01/05/2018   Procedure: COLONOSCOPY WITH PROPOFOL;  Surgeon: Jonathon Bellows, MD;  Location: [facility] ENDOSCOPY;  Service: Gastroenterology;  Laterality: N/A;  . ESOPHAGOGASTRODUODENOSCOPY (EGD) WITH PROPOFOL N/A 06/16/2016   Procedure: ESOPHAGOGASTRODUODENOSCOPY (EGD) WITH PROPOFOL;  Surgeon: Christene Lye, MD;  Location: [facility] ENDOSCOPY;  Service: Endoscopy;  Laterality: N/A;  . ESOPHAGOGASTRODUODENOSCOPY (EGD) WITH PROPOFOL N/A 01/05/2018   Procedure: ESOPHAGOGASTRODUODENOSCOPY (EGD) WITH PROPOFOL;  Surgeon: Jonathon Bellows, MD;  Location: [facility] ENDOSCOPY;  Service: Gastroenterology;  Laterality: N/A;  . ESOPHAGOGASTRODUODENOSCOPY (EGD) WITH PROPOFOL N/A 06/13/2018   Procedure: ESOPHAGOGASTRODUODENOSCOPY (EGD) WITH PROPOFOL;  Surgeon: Jonathon Bellows, MD;  Location: [facility] ENDOSCOPY;  Service: Gastroenterology;  Laterality: N/A;  . HARDWARE REMOVAL Left 12/27/2016   Procedure: HARDWARE REMOVAL LEFT  PROXIMAL HUMEROUS;  Surgeon: Leim Fabry, MD;  Location: [facility];  Service: Orthopedics;  Laterality: Left;  . IR GJ TUBE CHANGE  04/21/2018  . NISSEN FUNDOPLICATION N/A 3/87/5643   Procedure: NISSEN FUNDOPLICATION;  Surgeon: Dahlia Byes,  Marjory Lies, MD;  Location: [facility];  Service: General;  Laterality: N/A;  . ORIF HUMERUS FRACTURE Left 11/23/2016   Procedure: OPEN REDUCTION INTERNAL FIXATION (ORIF) PROXIMAL HUMERUS FRACTURE;  Surgeon: Leim Fabry, MD;  Location: [facility];  Service: Orthopedics;  Laterality: Left;   . REPAIR OF ESOPHAGUS  03/09/2018   Procedure: REPAIR OF ESOPHAGUS;  Surgeon: Jules Husbands, MD;  Location: [facility];  Service: General;;  . REVERSE SHOULDER ARTHROPLASTY Left 12/27/2016   Procedure: REVERSE SHOULDER ARTHROPLASTY;  Surgeon: Leim Fabry, MD;  Location: [facility];  Service: Orthopedics;  Laterality: Left;  . ROBOTIC ASSISTED LAPAROSCOPIC REPAIR OF PARAESOPHAGEAL HERNIA N/A 03/09/2018   Procedure: ROBOTIC HIATAL HERNIA CONVERTED TO OPEN;  Surgeon: Jules Husbands, MD;  Location: [facility];  Service: General;  Laterality: N/A;    Family History  Problem Relation Age of Onset  . Anxiety disorder Brother   . Obesity Brother   . COPD Brother   . Kidney disease Mother   . Heart attack Father   . Hypertension Father   . Alcohol abuse Father   . Depression Brother   . Anxiety disorder Brother   . Breast cancer Maternal Grandmother     Social History:  reports that she has been smoking cigarettes. She started smoking about 8 years ago. She has a 1.00 pack-year smoking history. She has never used smokeless tobacco. She reports that she does not drink alcohol or use drugs.  Allergies: No Known Allergies  Medications reviewed.    ROS Full ROS performed and is otherwise negative other than what is stated in HPI   BP (!) 89/62   Pulse 81   Temp (!) 97.5 F (36.4 C)   Ht 5\' 5"  (1.651 m)   Wt 112 lb 6.4 oz (51 kg)   LMP  (LMP Unknown)   BMI 18.70 kg/m   Physical Exam  NAD, malnourished GCS 15 Abd: soft, Well healed scar. Mild diffuse tenderness. No peritonitis. G-J tube in place , I could not see a leak and the g and J portions are working well. I do see that the tube has some fissures.    Assessment/Plan: Persistent dysphasia after esophageal narrowing and dilation.  Recommend a repeat dilation in 3 weeks.  She does have significant social issues to include chronic pain.  I have written a prescription for Percocet so she can have some relief of pain but I have also  refer her to the pain specialist.  I have  done countless imaging studies and work-up and never sought any acute intra-abdominal abnormalities that would that would explain her persistent abdominal pain. Event that she has persistent pain I will repeat another CT scan of the abdomen and pelvis. Also refer her to a tertiary center for 1 of the minimally invasive surgeons and see if they can do anything about potentially losing the fundoplication. I am uncertain if this will work because the patient reports that she cannot even tolerate the tube feeds and because the gastrojejunostomy is past the stricture that had nothing to do with the esophageal narrowing.  I did explain my thought process to the patient.   Also in order to have an exchange of the gastrojejunostomy tube since there is some breakdowns within the tubing. SHe does have support from the sister but is limited and they have significant fears and have been through a lot ( significant psychosocial issues).  She has also been seen by psychiatry because at some point in time she  was suicidal.  Currently there is no evidence of immediate suicidal ideation.   Greater than 50% of the 40 minutes  visit was spent in counseling/coordination of care   Caroleen Hamman, MD [place] Surgeon

## 2018-07-11 NOTE — Telephone Encounter (Signed)
IR has scheduled the patient for a Gastric Tube exchange at [facility] on 07/14/18. She is to arrive there by 12:30 pm and enter in through the [place] and report to the registration desk. I have also left a message for her sister about this.

## 2018-07-12 ENCOUNTER — Other Ambulatory Visit: Payer: Self-pay

## 2018-07-12 ENCOUNTER — Telehealth: Payer: Self-pay | Admitting: *Deleted

## 2018-07-12 DIAGNOSIS — R1084 Generalized abdominal pain: Secondary | ICD-10-CM

## 2018-07-12 DIAGNOSIS — Z8719 Personal history of other diseases of the digestive system: Secondary | ICD-10-CM

## 2018-07-12 DIAGNOSIS — Z9889 Other specified postprocedural states: Secondary | ICD-10-CM

## 2018-07-12 NOTE — Progress Notes (Signed)
Referral has been sent to Dr Cammie Sickle at [facility]. PH: [phone] Fax: [phone]  They will contact the patient to schedule

## 2018-07-12 NOTE — Telephone Encounter (Signed)
Chelsey [name] called the office about tube change scheduled for Friday arrival time of 12:30 pm. She states they have been unable to get in contact with the [name] and wanted to know if our office did.   I told Chelsey [name] it looked like Chelsey [name] a message and that I would try and have [name] call them back.   I was able to call and speak with the [name] today. She is aware of appointment on Friday and to call [facility] back at [phone] per request.

## 2018-07-14 ENCOUNTER — Emergency Department: Payer: Medicare Other

## 2018-07-14 ENCOUNTER — Inpatient Hospital Stay
Admission: EM | Admit: 2018-07-14 | Discharge: 2018-07-19 | DRG: 327 | Disposition: A | Discharge status: Skilled Nursing Facility | Payer: Medicare Other | Attending: Internal Medicine | Admitting: Internal Medicine

## 2018-07-14 ENCOUNTER — Ambulatory Visit: Admission: RE | Admit: 2018-07-14 | Payer: Federal, State, Local not specified - PPO | Source: Ambulatory Visit

## 2018-07-14 ENCOUNTER — Other Ambulatory Visit: Payer: Self-pay

## 2018-07-14 ENCOUNTER — Encounter: Payer: Self-pay | Admitting: Emergency Medicine

## 2018-07-14 DIAGNOSIS — Z803 Family history of malignant neoplasm of breast: Secondary | ICD-10-CM | POA: Diagnosis not present

## 2018-07-14 DIAGNOSIS — Z96612 Presence of left artificial shoulder joint: Secondary | ICD-10-CM | POA: Diagnosis present

## 2018-07-14 DIAGNOSIS — M810 Age-related osteoporosis without current pathological fracture: Secondary | ICD-10-CM | POA: Diagnosis present

## 2018-07-14 DIAGNOSIS — R1084 Generalized abdominal pain: Secondary | ICD-10-CM | POA: Diagnosis not present

## 2018-07-14 DIAGNOSIS — E871 Hypo-osmolality and hyponatremia: Secondary | ICD-10-CM | POA: Diagnosis present

## 2018-07-14 DIAGNOSIS — Z825 Family history of asthma and other chronic lower respiratory diseases: Secondary | ICD-10-CM | POA: Diagnosis not present

## 2018-07-14 DIAGNOSIS — F419 Anxiety disorder, unspecified: Secondary | ICD-10-CM | POA: Diagnosis present

## 2018-07-14 DIAGNOSIS — Z853 Personal history of malignant neoplasm of breast: Secondary | ICD-10-CM | POA: Diagnosis not present

## 2018-07-14 DIAGNOSIS — F17210 Nicotine dependence, cigarettes, uncomplicated: Secondary | ICD-10-CM | POA: Diagnosis present

## 2018-07-14 DIAGNOSIS — K5900 Constipation, unspecified: Secondary | ICD-10-CM | POA: Diagnosis present

## 2018-07-14 DIAGNOSIS — K9423 Gastrostomy malfunction: Principal | ICD-10-CM

## 2018-07-14 DIAGNOSIS — K219 Gastro-esophageal reflux disease without esophagitis: Secondary | ICD-10-CM | POA: Diagnosis present

## 2018-07-14 DIAGNOSIS — R1310 Dysphagia, unspecified: Secondary | ICD-10-CM | POA: Diagnosis present

## 2018-07-14 DIAGNOSIS — Z841 Family history of disorders of kidney and ureter: Secondary | ICD-10-CM | POA: Diagnosis not present

## 2018-07-14 DIAGNOSIS — E876 Hypokalemia: Secondary | ICD-10-CM | POA: Diagnosis not present

## 2018-07-14 DIAGNOSIS — Z20828 Contact with and (suspected) exposure to other viral communicable diseases: Secondary | ICD-10-CM | POA: Diagnosis present

## 2018-07-14 DIAGNOSIS — E785 Hyperlipidemia, unspecified: Secondary | ICD-10-CM | POA: Diagnosis present

## 2018-07-14 DIAGNOSIS — Z66 Do not resuscitate: Secondary | ICD-10-CM | POA: Diagnosis present

## 2018-07-14 DIAGNOSIS — R4702 Dysphasia: Secondary | ICD-10-CM | POA: Diagnosis not present

## 2018-07-14 DIAGNOSIS — Z8249 Family history of ischemic heart disease and other diseases of the circulatory system: Secondary | ICD-10-CM | POA: Diagnosis not present

## 2018-07-14 DIAGNOSIS — A419 Sepsis, unspecified organism: Secondary | ICD-10-CM | POA: Diagnosis not present

## 2018-07-14 DIAGNOSIS — Z811 Family history of alcohol abuse and dependence: Secondary | ICD-10-CM

## 2018-07-14 DIAGNOSIS — F329 Major depressive disorder, single episode, unspecified: Secondary | ICD-10-CM | POA: Diagnosis present

## 2018-07-14 DIAGNOSIS — Z923 Personal history of irradiation: Secondary | ICD-10-CM

## 2018-07-14 DIAGNOSIS — E7800 Pure hypercholesterolemia, unspecified: Secondary | ICD-10-CM | POA: Diagnosis present

## 2018-07-14 DIAGNOSIS — Z515 Encounter for palliative care: Secondary | ICD-10-CM | POA: Diagnosis not present

## 2018-07-14 DIAGNOSIS — Z818 Family history of other mental and behavioral disorders: Secondary | ICD-10-CM | POA: Diagnosis not present

## 2018-07-14 DIAGNOSIS — Z7189 Other specified counseling: Secondary | ICD-10-CM

## 2018-07-14 LAB — PROCALCITONIN: Procalcitonin: 0.18 ng/mL

## 2018-07-14 LAB — COMPREHENSIVE METABOLIC PANEL
ALT: 49 U/L — ABNORMAL HIGH (ref 0–44)
AST: 32 U/L (ref 15–41)
Albumin: 2.8 g/dL — ABNORMAL LOW (ref 3.5–5.0)
Alkaline Phosphatase: 215 U/L — ABNORMAL HIGH (ref 38–126)
Anion gap: 12 (ref 5–15)
BUN: 14 mg/dL (ref 8–23)
CO2: 27 mmol/L (ref 22–32)
Calcium: 8.8 mg/dL — ABNORMAL LOW (ref 8.9–10.3)
Chloride: 90 mmol/L — ABNORMAL LOW (ref 98–111)
Creatinine, Ser: 0.68 mg/dL (ref 0.44–1.00)
GFR calc Af Amer: >60 mL/min (ref >60)
GFR calc non Af Amer: >60 mL/min (ref >60)
Glucose, Bld: 121 mg/dL — ABNORMAL HIGH (ref 70–99)
Potassium: 4.4 mmol/L (ref 3.5–5.1)
Sodium: 129 mmol/L — ABNORMAL LOW (ref 135–145)
Total Bilirubin: 1.1 mg/dL (ref 0.3–1.2)
Total Protein: 6.8 g/dL (ref 6.5–8.1)

## 2018-07-14 LAB — APTT: aPTT: 34 seconds (ref 24–36)

## 2018-07-14 LAB — CBC
HCT: 35.4 % — ABNORMAL LOW (ref 36.0–46.0)
Hemoglobin: 11.4 g/dL — ABNORMAL LOW (ref 12.0–15.0)
MCH: 27.9 pg (ref 26.0–34.0)
MCHC: 32.2 g/dL (ref 30.0–36.0)
MCV: 86.6 fL (ref 80.0–100.0)
Platelets: 457 10*3/uL — ABNORMAL HIGH (ref 150–400)
RBC: 4.09 MIL/uL (ref 3.87–5.11)
RDW: 13.2 % (ref 11.5–15.5)
WBC: 20.4 10*3/uL — ABNORMAL HIGH (ref 4.0–10.5)
nRBC: 0 % (ref 0.0–0.2)

## 2018-07-14 LAB — URINALYSIS, COMPLETE (UACMP) WITH MICROSCOPIC
Bilirubin Urine: NEGATIVE
Glucose, UA: NEGATIVE mg/dL
Hgb urine dipstick: NEGATIVE
Ketones, ur: NEGATIVE mg/dL
Leukocytes,Ua: NEGATIVE
Nitrite: NEGATIVE
Protein, ur: NEGATIVE mg/dL
Specific Gravity, Urine: 1.005 (ref 1.005–1.030)
pH: 6 (ref 5.0–8.0)

## 2018-07-14 LAB — PROTIME-INR
INR: 1.2 (ref 0.8–1.2)
Prothrombin Time: 14.6 seconds (ref 11.4–15.2)

## 2018-07-14 LAB — LACTIC ACID, PLASMA: Lactic Acid, Venous: 1.3 mmol/L (ref 0.5–1.9)

## 2018-07-14 LAB — SARS CORONAVIRUS 2 BY RT PCR (HOSPITAL ORDER, PERFORMED IN ~~LOC~~ HOSPITAL LAB): SARS Coronavirus 2: NEGATIVE

## 2018-07-14 MED ORDER — ACETAMINOPHEN 325 MG PO TABS: 650.0000 mg | ORAL_TABLET | Freq: Four times a day (QID) | ORAL | Status: DC | PRN

## 2018-07-14 MED ORDER — IOHEXOL 350 MG/ML SOLN
75.0000 mL | Freq: Once | INTRAVENOUS | Status: AC | PRN
  Administered 2018-07-14: 19:00:00 75 mL via INTRAVENOUS

## 2018-07-14 MED ORDER — SODIUM CHLORIDE 0.9 % IV SOLN
2.0000 g | Freq: Two times a day (BID) | INTRAVENOUS | Status: DC
  Administered 2018-07-15 – 2018-07-19 (×9): 2 g via INTRAVENOUS
  Filled 2018-07-14 (×10): qty 2

## 2018-07-14 MED ORDER — BUSPIRONE HCL 10 MG PO TABS
10.0000 mg | ORAL_TABLET | Freq: Two times a day (BID) | ORAL | Status: DC
  Administered 2018-07-14 – 2018-07-18 (×8): 10 mg via ORAL
  Filled 2018-07-14 (×11): qty 1

## 2018-07-14 MED ORDER — SODIUM CHLORIDE 0.9 % IV SOLN
2.0000 g | Freq: Once | INTRAVENOUS | Status: AC
  Administered 2018-07-14: 2 g via INTRAVENOUS
  Filled 2018-07-14: qty 2

## 2018-07-14 MED ORDER — ALBUTEROL SULFATE (2.5 MG/3ML) 0.083% IN NEBU: 2.5000 mg | INHALATION_SOLUTION | RESPIRATORY_TRACT | Status: DC | PRN

## 2018-07-14 MED ORDER — SODIUM CHLORIDE 0.9 % IV SOLN: 500.0000 mg | Freq: Once | INTRAVENOUS | Status: DC

## 2018-07-14 MED ORDER — PANTOPRAZOLE SODIUM 40 MG PO TBEC
40.0000 mg | DELAYED_RELEASE_TABLET | Freq: Every day | ORAL | Status: DC
  Administered 2018-07-15 – 2018-07-18 (×3): 40 mg via ORAL
  Filled 2018-07-14 (×4): qty 1

## 2018-07-14 MED ORDER — OSMOLITE 1.5 CAL PO LIQD
237.0000 mL | Freq: Four times a day (QID) | ORAL | Status: DC
  Administered 2018-07-16 – 2018-07-19 (×10): 237 mL

## 2018-07-14 MED ORDER — METRONIDAZOLE IN NACL 5-0.79 MG/ML-% IV SOLN
500.0000 mg | Freq: Once | INTRAVENOUS | Status: AC
  Administered 2018-07-14: 500 mg via INTRAVENOUS
  Filled 2018-07-14: qty 100

## 2018-07-14 MED ORDER — SODIUM CHLORIDE 0.9 % IV BOLUS
1000.0000 mL | Freq: Once | INTRAVENOUS | Status: AC
  Administered 2018-07-14: 19:00:00 1000 mL via INTRAVENOUS

## 2018-07-14 MED ORDER — PAROXETINE HCL 20 MG PO TABS
20.0000 mg | ORAL_TABLET | Freq: Every day | ORAL | Status: DC
  Administered 2018-07-15 – 2018-07-18 (×4): 20 mg via ORAL
  Filled 2018-07-14 (×6): qty 1

## 2018-07-14 MED ORDER — SODIUM CHLORIDE 0.9 % IV SOLN
INTRAVENOUS | Status: DC
  Administered 2018-07-14 – 2018-07-19 (×9): via INTRAVENOUS

## 2018-07-14 MED ORDER — SODIUM CHLORIDE 0.9 % IV BOLUS (SEPSIS)
500.0000 mL | Freq: Once | INTRAVENOUS | Status: AC
  Administered 2018-07-14: 17:00:00 500 mL via INTRAVENOUS

## 2018-07-14 MED ORDER — ENOXAPARIN SODIUM 40 MG/0.4ML ~~LOC~~ SOLN
40.0000 mg | SUBCUTANEOUS | Status: DC
  Administered 2018-07-15 – 2018-07-18 (×4): 40 mg via SUBCUTANEOUS
  Filled 2018-07-14 (×5): qty 0.4

## 2018-07-14 MED ORDER — SODIUM CHLORIDE 0.9 % IV BOLUS (SEPSIS)
1000.0000 mL | Freq: Once | INTRAVENOUS | Status: AC
  Administered 2018-07-14: 1000 mL via INTRAVENOUS

## 2018-07-14 MED ORDER — SODIUM CHLORIDE 0.9 % IV SOLN: 1.0000 g | Freq: Once | INTRAVENOUS | Status: DC

## 2018-07-14 MED ORDER — SODIUM CHLORIDE 0.9 % IV BOLUS
1000.0000 mL | Freq: Once | INTRAVENOUS | Status: AC
  Administered 2018-07-14: 14:00:00 1000 mL via INTRAVENOUS

## 2018-07-14 MED ORDER — VANCOMYCIN HCL IN DEXTROSE 1-5 GM/200ML-% IV SOLN
1000.0000 mg | INTRAVENOUS | Status: DC
  Administered 2018-07-15: 1000 mg via INTRAVENOUS
  Filled 2018-07-14 (×2): qty 200

## 2018-07-14 MED ORDER — TRAZODONE HCL 100 MG PO TABS
100.0000 mg | ORAL_TABLET | Freq: Every day | ORAL | Status: DC
  Administered 2018-07-14 – 2018-07-18 (×5): 100 mg via ORAL
  Filled 2018-07-14 (×6): qty 1

## 2018-07-14 MED ORDER — ONDANSETRON HCL 4 MG PO TABS: 4.0000 mg | ORAL_TABLET | Freq: Four times a day (QID) | ORAL | Status: DC | PRN

## 2018-07-14 MED ORDER — HYDROCODONE-ACETAMINOPHEN 5-325 MG PO TABS
1.0000 | ORAL_TABLET | ORAL | Status: DC | PRN
  Administered 2018-07-15 – 2018-07-17 (×3): 1 via ORAL
  Filled 2018-07-14 (×3): qty 1

## 2018-07-14 MED ORDER — VANCOMYCIN HCL IN DEXTROSE 1-5 GM/200ML-% IV SOLN
1000.0000 mg | Freq: Once | INTRAVENOUS | Status: AC
  Administered 2018-07-14: 1000 mg via INTRAVENOUS
  Filled 2018-07-14: qty 200

## 2018-07-14 MED ORDER — METRONIDAZOLE IN NACL 5-0.79 MG/ML-% IV SOLN
500.0000 mg | Freq: Three times a day (TID) | INTRAVENOUS | Status: DC
  Administered 2018-07-15 – 2018-07-18 (×11): 500 mg via INTRAVENOUS
  Filled 2018-07-14 (×14): qty 100

## 2018-07-14 MED ORDER — ACETAMINOPHEN 650 MG RE SUPP: 650.0000 mg | Freq: Four times a day (QID) | RECTAL | Status: DC | PRN

## 2018-07-14 MED ORDER — SODIUM CHLORIDE 0.9 % IV BOLUS (SEPSIS)
250.0000 mL | Freq: Once | INTRAVENOUS | Status: AC
  Administered 2018-07-14: 17:00:00 250 mL via INTRAVENOUS

## 2018-07-14 MED ORDER — SIMVASTATIN 20 MG PO TABS
20.0000 mg | ORAL_TABLET | Freq: Every day | ORAL | Status: DC
  Administered 2018-07-14 – 2018-07-17 (×4): 20 mg via ORAL
  Filled 2018-07-14 (×6): qty 1

## 2018-07-14 MED ORDER — BISACODYL 5 MG PO TBEC
5.0000 mg | DELAYED_RELEASE_TABLET | Freq: Every day | ORAL | Status: DC | PRN
  Administered 2018-07-15: 5 mg via ORAL
  Filled 2018-07-14: qty 1

## 2018-07-14 MED ORDER — SENNOSIDES-DOCUSATE SODIUM 8.6-50 MG PO TABS
1.0000 | ORAL_TABLET | Freq: Every evening | ORAL | Status: DC | PRN
  Administered 2018-07-16: 1 via ORAL
  Filled 2018-07-14: qty 1

## 2018-07-14 MED ORDER — DOCUSATE SODIUM 100 MG PO CAPS
100.0000 mg | ORAL_CAPSULE | Freq: Two times a day (BID) | ORAL | Status: DC
  Administered 2018-07-14 – 2018-07-16 (×4): 100 mg via ORAL
  Filled 2018-07-14 (×4): qty 1

## 2018-07-14 MED ORDER — ONDANSETRON HCL 4 MG/2ML IJ SOLN
4.0000 mg | Freq: Four times a day (QID) | INTRAMUSCULAR | Status: DC | PRN
  Administered 2018-07-18: 4 mg via INTRAVENOUS
  Filled 2018-07-14: qty 2

## 2018-07-14 NOTE — ED Notes (Signed)
ED TO INPATIENT HANDOFF REPORT  ED Nurse Name and Phone #:Teron Blais [phone]   S Name/Age/Gender Chelsey [name] 68 y.o. female Room/Bed: ED09A/ED09A  Code Status   Code Status: Prior  Home/SNF/Other Home Patient oriented to: self, place, time and situation Is this baseline? Yes   Triage Complete: Triage complete  Chief Complaint fever  Triage Note [facility] and she had a fever and felt sick so they brought herer for covid testing.  Pt to ED with c/o of fever. Pt was to have G tube placed today but was told she had a fever and sent to ED for COVID testing. Pt denies N/V/D and is afebrile in triage. Pt is hypotensive in triage.    Allergies No Known Allergies  Level of Care/Admitting Diagnosis ED Disposition    ED Disposition Condition [facility] Area: [place] [[id number]]  Level of Care: Med-Surg [16]  Covid Evaluation: Confirmed COVID Negative  Diagnosis: Sepsis [facility]) [[id number]]  Admitting Physician: Demetrios Loll [[id number]]  Attending Physician: Demetrios Loll [[id number]]  Estimated length of stay: past midnight tomorrow  Certification:: I certify this patient will need inpatient services for at least 2 midnights  PT Class (Do Not Modify): Inpatient [101]  PT Acc Code (Do Not Modify): Private [1]       B Medical/Surgery History Past Medical History:  Diagnosis Date  . Breast cancer ([place]) 1997   right breast, radiation  . Bronchitis    recent/ 06/09/15 had chest xray/ [facility]/resolved  . Cancer [facility]) 1997   right lumpectomy,L/Ad/R   . GERD (gastroesophageal reflux disease)   . History of hiatal hernia   . Hypercholesteremia   . Hyperlipidemia   . Low BP    TYPICALLY RUNS 80'S/60'S  . Panic attack   . Personal history of malignant neoplasm of breast    Past Surgical History:  Procedure Laterality Date  . BICEPT TENODESIS Left 11/23/2016   Procedure: BICEPS TENODESIS;  Surgeon:  Leim Fabry, MD;  Location: [facility];  Service: Orthopedics;  Laterality: Left;  . BREAST EXCISIONAL BIOPSY Right 1997   pos  . BREAST SURGERY Right 1997   lumpectomy  . CHOLECYSTECTOMY N/A 08/06/2016   Procedure: LAPAROSCOPIC CHOLECYSTECTOMY WITH INTRAOPERATIVE CHOLANGIOGRAM;  Surgeon: Christene Lye, MD;  Location: [facility];  Service: General;  Laterality: N/A;  . COLONOSCOPY  2008  . COLONOSCOPY WITH PROPOFOL N/A 07/21/2015   Procedure: COLONOSCOPY WITH PROPOFOL;  Surgeon: Lucilla Lame, MD;  Location: [place];  Service: Endoscopy;  Laterality: N/A;  . COLONOSCOPY WITH PROPOFOL N/A 01/05/2018   Procedure: COLONOSCOPY WITH PROPOFOL;  Surgeon: Jonathon Bellows, MD;  Location: [facility] ENDOSCOPY;  Service: Gastroenterology;  Laterality: N/A;  . ESOPHAGOGASTRODUODENOSCOPY (EGD) WITH PROPOFOL N/A 06/16/2016   Procedure: ESOPHAGOGASTRODUODENOSCOPY (EGD) WITH PROPOFOL;  Surgeon: Christene Lye, MD;  Location: [facility] ENDOSCOPY;  Service: Endoscopy;  Laterality: N/A;  . ESOPHAGOGASTRODUODENOSCOPY (EGD) WITH PROPOFOL N/A 01/05/2018   Procedure: ESOPHAGOGASTRODUODENOSCOPY (EGD) WITH PROPOFOL;  Surgeon: Jonathon Bellows, MD;  Location: [facility] ENDOSCOPY;  Service: Gastroenterology;  Laterality: N/A;  . ESOPHAGOGASTRODUODENOSCOPY (EGD) WITH PROPOFOL N/A 06/13/2018   Procedure: ESOPHAGOGASTRODUODENOSCOPY (EGD) WITH PROPOFOL;  Surgeon: Jonathon Bellows, MD;  Location: [facility] ENDOSCOPY;  Service: Gastroenterology;  Laterality: N/A;  . HARDWARE REMOVAL Left 12/27/2016   Procedure: HARDWARE REMOVAL LEFT  PROXIMAL HUMEROUS;  Surgeon: Leim Fabry, MD;  Location: [facility];  Service: Orthopedics;  Laterality: Left;  . IR GJ TUBE CHANGE  04/21/2018  . NISSEN FUNDOPLICATION N/A 2/54/2706   Procedure: NISSEN FUNDOPLICATION;  Surgeon: Jules Husbands, MD;  Location: [facility];  Service: General;  Laterality: N/A;  . ORIF HUMERUS FRACTURE Left 11/23/2016   Procedure: OPEN REDUCTION INTERNAL FIXATION (ORIF) PROXIMAL  HUMERUS FRACTURE;  Surgeon: Leim Fabry, MD;  Location: [facility];  Service: Orthopedics;  Laterality: Left;  . REPAIR OF ESOPHAGUS  03/09/2018   Procedure: REPAIR OF ESOPHAGUS;  Surgeon: Jules Husbands, MD;  Location: [facility];  Service: General;;  . REVERSE SHOULDER ARTHROPLASTY Left 12/27/2016   Procedure: REVERSE SHOULDER ARTHROPLASTY;  Surgeon: Leim Fabry, MD;  Location: [facility];  Service: Orthopedics;  Laterality: Left;  . ROBOTIC ASSISTED LAPAROSCOPIC REPAIR OF PARAESOPHAGEAL HERNIA N/A 03/09/2018   Procedure: ROBOTIC HIATAL HERNIA CONVERTED TO OPEN;  Surgeon: Jules Husbands, MD;  Location: [facility];  Service: General;  Laterality: N/A;     A IV Location/Drains/Wounds Patient Lines/Drains/Airways Status   Active Line/Drains/Airways    Name:   Placement date:   Placement time:   Site:   Days:   Peripheral IV 03/28/18 Anterior;Right Forearm   03/28/18    1250    Forearm   108   Peripheral IV 06/13/18 Right Forearm   06/13/18    1329    Forearm   31   Peripheral IV 07/14/18 Left Arm   07/14/18    1345    Arm   less than 1   Peripheral IV 07/14/18 Right Antecubital   07/14/18    1647    Antecubital   less than 1   Gastrostomy/Enterostomy PEG-jejunostomy 18 Fr. LLQ   03/09/18    1551    LLQ   127   Airway   06/13/18    1350     31   Pressure Injury 03/28/18 Stage I -  Intact skin with non-blanchable redness of a localized area usually over a bony prominence.   03/28/18    1200     108          Intake/Output Last 24 hours  Intake/Output Summary (Last 24 hours) at 07/14/2018 2001 Last data filed at 07/14/2018 1932 Gross per 24 hour  Intake 100 ml  Output -  Net 100 ml    Labs/Imaging Results for orders placed or performed during the hospital encounter of 07/14/18 (from the past 48 hour(s))  CBC     Status: Abnormal   Collection Time: 07/14/18  1:47 PM  Result Value Ref Range   WBC 20.4 (H) 4.0 - 10.5 K/uL   RBC 4.09 3.87 - 5.11 MIL/uL   Hemoglobin 11.4 (L) 12.0 - 15.0 g/dL    HCT 35.4 (L) 36.0 - 46.0 %   MCV 86.6 80.0 - 100.0 fL   MCH 27.9 26.0 - 34.0 pg   MCHC 32.2 30.0 - 36.0 g/dL   RDW 13.2 11.5 - 15.5 %   Platelets 457 (H) 150 - 400 K/uL   nRBC 0.0 0.0 - 0.2 %    Comment: Performed at [facility], [address] [place], [place] [address]  Comprehensive metabolic panel     Status: Abnormal   Collection Time: 07/14/18  1:47 PM  Result Value Ref Range   Sodium 129 (L) 135 - 145 mmol/L   Potassium 4.4 3.5 - 5.1 mmol/L   Chloride 90 (L) 98 - 111 mmol/L   CO2 27 22 - 32 mmol/L   Glucose, Bld 121 (H) 70 - 99 mg/dL   BUN 14  8 - 23 mg/dL   Creatinine, Ser 0.68 0.44 - 1.00 mg/dL   Calcium 8.8 (L) 8.9 - 10.3 mg/dL   Total Protein 6.8 6.5 - 8.1 g/dL   Albumin 2.8 (L) 3.5 - 5.0 g/dL   AST 32 15 - 41 U/L   ALT 49 (H) 0 - 44 U/L   Alkaline Phosphatase 215 (H) 38 - 126 U/L   Total Bilirubin 1.1 0.3 - 1.2 mg/dL   GFR calc non Af Amer >60 >60 mL/min   GFR calc Af Amer >60 >60 mL/min   Anion gap 12 5 - 15    Comment: Performed at [facility], [address] [place], [place] [address]  SARS Coronavirus 2 (CEPHEID- Performed in [place] hospital lab), Hosp Order     Status: None   Collection Time: 07/14/18  3:32 PM   Specimen: Nasopharyngeal Swab  Result Value Ref Range   SARS Coronavirus 2 NEGATIVE NEGATIVE    Comment: (NOTE) If result is NEGATIVE SARS-CoV-2 target nucleic acids are NOT DETECTED. The SARS-CoV-2 RNA is generally detectable in upper and lower  respiratory specimens during the acute phase of infection. The lowest  concentration of SARS-CoV-2 viral copies this assay can detect is 250  copies / mL. A negative result does not preclude SARS-CoV-2 infection  and should not be used as the sole basis for treatment or other  patient management decisions.  A negative result may occur with  improper specimen collection / handling, submission of specimen other  than nasopharyngeal swab, presence of viral mutation(s) within the   areas targeted by this assay, and inadequate number of viral copies  (<250 copies / mL). A negative result must be combined with clinical  observations, patient history, and epidemiological information. If result is POSITIVE SARS-CoV-2 target nucleic acids are DETECTED. The SARS-CoV-2 RNA is generally detectable in upper and lower  respiratory specimens dur ing the acute phase of infection.  Positive  results are indicative of active infection with SARS-CoV-2.  Clinical  correlation with patient history and other diagnostic information is  necessary to determine patient infection status.  Positive results do  not rule out bacterial infection or co-infection with other viruses. If result is PRESUMPTIVE POSTIVE SARS-CoV-2 nucleic acids MAY BE PRESENT.   A presumptive positive result was obtained on the submitted specimen  and confirmed on repeat testing.  While 2019 novel coronavirus  (SARS-CoV-2) nucleic acids may be present in the submitted sample  additional confirmatory testing may be necessary for epidemiological  and / or clinical management purposes  to differentiate between  SARS-CoV-2 and other Sarbecovirus currently known to infect humans.  If clinically indicated additional testing with an alternate test  methodology [phone]) is advised. The SARS-CoV-2 RNA is generally  detectable in upper and lower respiratory sp ecimens during the acute  phase of infection. The expected result is Negative. Fact Sheet for Patients:  StrictlyIdeas.no Fact Sheet for Healthcare Providers: BankingDealers.co.za This test is not yet approved or cleared by the [place] FDA and has been authorized for detection and/or diagnosis of SARS-CoV-2 by FDA under an Emergency Use Authorization (EUA).  This EUA will remain in effect (meaning this test can be used) for the duration of the COVID-19 declaration under Section 564(b)(1) of the Act, 21  U.S.C. section 360bbb-3(b)(1), unless the authorization is terminated or revoked sooner. Performed at [facility], [place], [place] [address]   Lactic acid, plasma     Status: None  Collection Time: 07/14/18  4:29 PM  Result Value Ref Range   Lactic Acid, Venous 1.3 0.5 - 1.9 mmol/L    Comment: Performed at [facility], [address] [place], [place] [address]  Urinalysis, Complete w Microscopic     Status: Abnormal   Collection Time: 07/14/18  6:28 PM  Result Value Ref Range   Color, Urine STRAW (A) YELLOW   APPearance CLEAR (A) CLEAR   Specific Gravity, Urine 1.005 1.005 - 1.030   pH 6.0 5.0 - 8.0   Glucose, UA NEGATIVE NEGATIVE mg/dL   Hgb urine dipstick NEGATIVE NEGATIVE   Bilirubin Urine NEGATIVE NEGATIVE   Ketones, ur NEGATIVE NEGATIVE mg/dL   Protein, ur NEGATIVE NEGATIVE mg/dL   Nitrite NEGATIVE NEGATIVE   Leukocytes,Ua NEGATIVE NEGATIVE   WBC, UA 0-5 0 - 5 WBC/hpf   Bacteria, UA FEW (A) NONE SEEN   Squamous Epithelial / LPF 0-5 0 - 5    Comment: Performed at [facility], [address], [place], [place] [address]   Dg Chest Portable 1 View  Result Date: 07/14/2018 CLINICAL DATA:  Fever. EXAM: PORTABLE CHEST 1 VIEW COMPARISON:  Chest x-rays dated 05/29/2018 and 04/10/2018. FINDINGS: Heart size and mediastinal contours are stable. Lungs are clear. No pleural effusion or pneumothorax seen. No acute or suspicious osseous finding. LEFT shoulder arthroplasty hardware in place. IMPRESSION: No active disease. No evidence of pneumonia or pulmonary edema. Electronically Signed   By: Franki Cabot M.D.   On: 07/14/2018 16:01    Pending Labs Unresulted Labs (From admission, onward)    Start     Ordered   07/14/18 1531  Blood Culture (routine x 2)  BLOOD CULTURE X 2,   STAT     07/14/18 1530   Signed and Held  Basic metabolic panel  Tomorrow morning,   R     Signed and Held   Signed and Held  CBC  Tomorrow morning,   R      Signed and Held   Signed and Held  Creatinine, serum  (enoxaparin (LOVENOX)    CrCl >/= 30 ml/min)  Weekly,   R    Comments: while on enoxaparin therapy    Signed and Held   Signed and Held  Procalcitonin  ONCE - STAT,   R     Signed and Held   Signed and Held  Protime-INR  ONCE - STAT,   R     Signed and Held   Signed and Held  APTT  ONCE - STAT,   R     Signed and Held          Vitals/Pain Today's Vitals   07/14/18 1830 07/14/18 1835 07/14/18 1900 07/14/18 1930  BP: (!) 87/50 (!) 85/62 (!) 84/51 (!) 89/60  Pulse: 83 82 76 77  Resp: 13 18 15  (!) 21  Temp:      TempSrc:      SpO2: 98% (!) 89% 100% 100%  Weight:      Height:      PainSc:        Isolation Precautions Droplet and Contact precautions  Medications Medications  sodium chloride 0.9 % bolus 1,000 mL (0 mLs Intravenous Stopped 07/14/18 1501)  sodium chloride 0.9 % bolus 1,000 mL (0 mLs Intravenous Stopped 07/14/18 1828)    And  sodium chloride 0.9 % bolus 500 mL (0 mLs Intravenous Stopped 07/14/18 1706)    And  sodium chloride 0.9 % bolus 250 mL (0 mLs Intravenous  Stopped 07/14/18 1706)  ceFEPIme (MAXIPIME) 2 g in sodium chloride 0.9 % 100 mL IVPB (0 g Intravenous Stopped 07/14/18 1732)  metroNIDAZOLE (FLAGYL) IVPB 500 mg (0 mg Intravenous Stopped 07/14/18 1932)  vancomycin (VANCOCIN) IVPB 1000 mg/200 mL premix (0 mg Intravenous Stopped 07/14/18 1828)  sodium chloride 0.9 % bolus 1,000 mL (1,000 mLs Intravenous New Bag/Given 07/14/18 1847)  iohexol (OMNIPAQUE) 350 MG/ML injection 75 mL (75 mLs Intravenous Contrast Given 07/14/18 1914)    Mobility walks Low fall risk   Focused Assessments Cardiac Assessment Handoff:  Cardiac Rhythm: Normal sinus rhythm Lab Results  Component Value Date   TROPONINI <0.03 08/19/2016   No results found for: DDIMER Does the Patient currently have chest pain? No     R Recommendations: See Admitting Provider Note  Report given to:   Additional Notes:

## 2018-07-14 NOTE — ED Notes (Signed)
hospitalist in to see pt.

## 2018-07-14 NOTE — ED Notes (Signed)
Pt assisted with bed pan

## 2018-07-14 NOTE — ED Triage Notes (Signed)
[facility] and she had a fever and felt sick so they brought herer for covid testing.

## 2018-07-14 NOTE — ED Triage Notes (Addendum)
Pt to ED with c/o of fever. Pt was to have G tube placed today but was told she had a fever and sent to ED for COVID testing. Pt denies N/V/D and is afebrile in triage. Pt is hypotensive in triage.

## 2018-07-14 NOTE — ED Notes (Signed)
MD made aware of patient's BP becoming hypotensive again, VORB for NS bolus. Pt taken to CT.

## 2018-07-14 NOTE — Progress Notes (Signed)
PHARMACY -  BRIEF ANTIBIOTIC NOTE   Pharmacy has received consult(s) for cefepime and vancomycin from an ED provider.  The patient's profile has been reviewed for ht/wt/allergies/indication/available labs.    One time order(s) placed for cefepime 2g IV x 1 and vancomycin 1g IV x 1.   Further antibiotics/pharmacy consults should be ordered by admitting physician if indicated.                       Thank you, Simpson,Michael L 07/14/2018  4:25 PM

## 2018-07-14 NOTE — H&P (Addendum)
[place] [facility] [place] NAME: Chelsey [name]    MR#:  [id number]  DATE OF BIRTH:  [birth year]  DATE OF ADMISSION:  07/14/2018  PRIMARY CARE PHYSICIAN: Guadalupe Maple, MD   REQUESTING/REFERRING PHYSICIAN: Dr. Corky Downs  CHIEF COMPLAINT:   Chief Complaint  Patient presents with  . Fever  . Hypotension   Fever and chills. HISTORY OF PRESENT ILLNESS:  Chelsey [name]  is a 68 y.o. female with a known history of breast cancer, bronchitis, GERD, hiatal hernia, hyperlipidemia and low BP.  The patient presents the ED with above chief complaints.  The patient had a held her hernia surgery in February.  She complains of fever and chills for the past 2 to 3 days.  He also complains of a cough with yellow sputum.  She complains of abdominal pain which is diffuse and intermittent without radiation.  She denies any dysuria or hematuria but has dark urine.  She has nausea and constipation but no melena or bloody stool.  She is found tension, leukocytosis and tachypnea.  Sepsis protocol is started.  The patient was given IV antibiotics and normal saline bolus.  Urinalysis and chest x-ray is unremarkable.  Abdominal CT is pending. PAST MEDICAL HISTORY:   Past Medical History:  Diagnosis Date  . Breast cancer ([place]) 1997   right breast, radiation  . Bronchitis    recent/ 06/09/15 had chest xray/ [facility]/resolved  . Cancer [facility]) 1997   right lumpectomy,L/Ad/R   . GERD (gastroesophageal reflux disease)   . History of hiatal hernia   . Hypercholesteremia   . Hyperlipidemia   . Low BP    TYPICALLY RUNS 80'S/60'S  . Panic attack   . Personal history of malignant neoplasm of breast     PAST SURGICAL HISTORY:   Past Surgical History:  Procedure Laterality Date  . BICEPT TENODESIS Left 11/23/2016   Procedure: BICEPS TENODESIS;  Surgeon: Leim Fabry, MD;  Location: [facility];  Service: Orthopedics;  Laterality: Left;  . BREAST EXCISIONAL BIOPSY  Right 1997   pos  . BREAST SURGERY Right 1997   lumpectomy  . CHOLECYSTECTOMY N/A 08/06/2016   Procedure: LAPAROSCOPIC CHOLECYSTECTOMY WITH INTRAOPERATIVE CHOLANGIOGRAM;  Surgeon: Christene Lye, MD;  Location: [facility];  Service: General;  Laterality: N/A;  . COLONOSCOPY  2008  . COLONOSCOPY WITH PROPOFOL N/A 07/21/2015   Procedure: COLONOSCOPY WITH PROPOFOL;  Surgeon: Lucilla Lame, MD;  Location: [place];  Service: Endoscopy;  Laterality: N/A;  . COLONOSCOPY WITH PROPOFOL N/A 01/05/2018   Procedure: COLONOSCOPY WITH PROPOFOL;  Surgeon: Jonathon Bellows, MD;  Location: [facility] ENDOSCOPY;  Service: Gastroenterology;  Laterality: N/A;  . ESOPHAGOGASTRODUODENOSCOPY (EGD) WITH PROPOFOL N/A 06/16/2016   Procedure: ESOPHAGOGASTRODUODENOSCOPY (EGD) WITH PROPOFOL;  Surgeon: Christene Lye, MD;  Location: [facility] ENDOSCOPY;  Service: Endoscopy;  Laterality: N/A;  . ESOPHAGOGASTRODUODENOSCOPY (EGD) WITH PROPOFOL N/A 01/05/2018   Procedure: ESOPHAGOGASTRODUODENOSCOPY (EGD) WITH PROPOFOL;  Surgeon: Jonathon Bellows, MD;  Location: [facility] ENDOSCOPY;  Service: Gastroenterology;  Laterality: N/A;  . ESOPHAGOGASTRODUODENOSCOPY (EGD) WITH PROPOFOL N/A 06/13/2018   Procedure: ESOPHAGOGASTRODUODENOSCOPY (EGD) WITH PROPOFOL;  Surgeon: Jonathon Bellows, MD;  Location: [facility] ENDOSCOPY;  Service: Gastroenterology;  Laterality: N/A;  . HARDWARE REMOVAL Left 12/27/2016   Procedure: HARDWARE REMOVAL LEFT  PROXIMAL HUMEROUS;  Surgeon: Leim Fabry, MD;  Location: [facility];  Service: Orthopedics;  Laterality: Left;  . IR GJ TUBE CHANGE  04/21/2018  . NISSEN FUNDOPLICATION N/A 6/76/1949   Procedure: NISSEN FUNDOPLICATION;  Surgeon: Jules Husbands, MD;  Location: [facility];  Service: General;  Laterality: N/A;  . ORIF HUMERUS FRACTURE Left 11/23/2016   Procedure: OPEN REDUCTION INTERNAL FIXATION (ORIF) PROXIMAL HUMERUS FRACTURE;  Surgeon: Leim Fabry, MD;  Location: [facility];  Service: Orthopedics;  Laterality: Left;  .  REPAIR OF ESOPHAGUS  03/09/2018   Procedure: REPAIR OF ESOPHAGUS;  Surgeon: Jules Husbands, MD;  Location: [facility];  Service: General;;  . REVERSE SHOULDER ARTHROPLASTY Left 12/27/2016   Procedure: REVERSE SHOULDER ARTHROPLASTY;  Surgeon: Leim Fabry, MD;  Location: [facility];  Service: Orthopedics;  Laterality: Left;  . ROBOTIC ASSISTED LAPAROSCOPIC REPAIR OF PARAESOPHAGEAL HERNIA N/A 03/09/2018   Procedure: ROBOTIC HIATAL HERNIA CONVERTED TO OPEN;  Surgeon: Jules Husbands, MD;  Location: [facility];  Service: General;  Laterality: N/A;    SOCIAL HISTORY:   Social History   Tobacco Use  . Smoking status: Current Some Day Smoker    Packs/day: 0.10    Years: 10.00    Pack years: 1.00    Types: Cigarettes    Start date: 11/25/2009  . Smokeless tobacco: Never Used  . Tobacco comment: 1 cigarette daily  Substance Use Topics  . Alcohol use: No    Alcohol/week: 0.0 standard drinks    FAMILY HISTORY:   Family History  Problem Relation Age of Onset  . Anxiety disorder Brother   . Obesity Brother   . COPD Brother   . Kidney disease Mother   . Heart attack Father   . Hypertension Father   . Alcohol abuse Father   . Depression Brother   . Anxiety disorder Brother   . Breast cancer Maternal Grandmother     DRUG ALLERGIES:  No Known Allergies  REVIEW OF SYSTEMS:   Review of Systems  Constitutional: Positive for chills, fever and malaise/fatigue.  HENT: Negative for sore throat.   Eyes: Negative for blurred vision and double vision.  Respiratory: Positive for cough and sputum production. Negative for hemoptysis, shortness of breath, wheezing and stridor.   Cardiovascular: Negative for chest pain, palpitations, orthopnea and leg swelling.  Gastrointestinal: Positive for abdominal pain, constipation and nausea. Negative for blood in stool, diarrhea, melena and vomiting.       Dysphagia.  Genitourinary: Negative for dysuria, flank pain and hematuria.  Musculoskeletal: Negative for  back pain and joint pain.  Skin: Negative for rash.  Neurological: Positive for dizziness. Negative for sensory change, focal weakness, seizures, loss of consciousness, weakness and headaches.  Endo/Heme/Allergies: Negative for polydipsia.  Psychiatric/Behavioral: Negative for depression. The patient is not nervous/anxious.     MEDICATIONS AT HOME:   Prior to Admission medications   Medication Sig Start Date End Date Taking? Authorizing Provider  busPIRone (BUSPAR) 10 MG tablet Take 1 tablet (10 mg total) by mouth 2 (two) times daily. 05/29/18  Yes Rainey Pines, MD  Calcium Carbonate-Vitamin D (CALCIUM + D PO) Take 1 tablet daily by mouth.    Yes [provider]  Cholecalciferol (VITAMIN D) 2000 units tablet Take 2,000 Units by mouth daily.   Yes [provider]  lidocaine (XYLOCAINE) 5 % ointment Apply 1 application topically 3 (three) times daily as needed. 04/18/18  Yes Piscoya, Jacqulyn Bath, MD  Multiple Vitamin (MULTIVITAMIN) tablet Take 1 tablet daily by mouth.    Yes [provider]  Nutritional Supplements (FEEDING SUPPLEMENT, OSMOLITE 1.5 CAL,) LIQD Place 237 mLs into feeding tube 4 (four) times daily. 04/04/18  Yes Tylene Fantasia, PA-C  omeprazole (PRILOSEC) 20 MG  capsule Take 1 capsule (20 mg total) by mouth 2 (two) times daily before a meal. 07/27/17  Yes Kathrine Haddock, NP  oxyCODONE-acetaminophen (PERCOCET) 7.5-325 MG tablet Take 1 tablet by mouth every 4 (four) hours as needed for severe pain. Patient taking differently: Take 1 tablet by mouth daily.  07/10/18  Yes Pabon, Diego F, MD  PARoxetine (PAXIL) 20 MG tablet Take 1 tablet (20 mg total) by mouth daily before supper. 05/29/18  Yes Rainey Pines, MD  Simethicone (GAS-X PO) Take 1-2 tablets by mouth daily as needed (gas).   Yes [provider]  simvastatin (ZOCOR) 20 MG tablet TAKE ONE TABLET BY MOUTH AT BEDTIME 12/16/17  Yes Kathrine Haddock, NP  traZODone (DESYREL) 100 MG tablet Take 1 tablet (100 mg  total) by mouth at bedtime. 05/29/18  Yes Rainey Pines, MD  ibuprofen (ADVIL,MOTRIN) 200 MG tablet Take 200-400 mg by mouth daily as needed for headache or moderate pain.    [provider]      VITAL SIGNS:  Blood pressure (!) 89/60, pulse 77, temperature 99.3 F (37.4 C), temperature source Oral, resp. rate (!) 21, height 5\' 5"  (1.651 m), weight 50.8 kg, SpO2 100 %.  PHYSICAL EXAMINATION:  Physical Exam  GENERAL:  68 y.o.-year-old patient lying in the bed with no acute distress.  EYES: Pupils equal, round, reactive to light and accommodation. No scleral icterus. Extraocular muscles intact.  HEENT: Head atraumatic, normocephalic. Oropharynx and nasopharynx clear.  NECK:  Supple, no jugular venous distention. No thyroid enlargement, no tenderness.  LUNGS: Normal breath sounds bilaterally, no wheezing, rales,rhonchi or crepitation. No use of accessory muscles of respiration.  CARDIOVASCULAR: S1, S2 normal. No murmurs, rubs, or gallops.  ABDOMEN: Soft, diffuse abdominal tenderness, nondistended.  No rebound or rigidity.  Bowel sounds present. No organomegaly or mass.  EXTREMITIES: No pedal edema, cyanosis, or clubbing.  NEUROLOGIC: Cranial nerves II through XII are intact. Muscle strength 5/5 in all extremities. Sensation intact. Gait not checked.  PSYCHIATRIC: The patient is alert and oriented x 3.  SKIN: No obvious rash, lesion, or ulcer.   LABORATORY PANEL:   CBC Recent Labs  Lab 07/14/18 1347  WBC 20.4*  HGB 11.4*  HCT 35.4*  PLT 457*   ------------------------------------------------------------------------------------------------------------------  Chemistries  Recent Labs  Lab 07/14/18 1347  NA 129*  K 4.4  CL 90*  CO2 27  GLUCOSE 121*  BUN 14  CREATININE 0.68  CALCIUM 8.8*  AST 32  ALT 49*  ALKPHOS 215*  BILITOT 1.1   ------------------------------------------------------------------------------------------------------------------  Cardiac Enzymes  No results for input(s): TROPONINI in the last 168 hours. ------------------------------------------------------------------------------------------------------------------  RADIOLOGY:  Dg Chest Portable 1 View  Result Date: 07/14/2018 CLINICAL DATA:  Fever. EXAM: PORTABLE CHEST 1 VIEW COMPARISON:  Chest x-rays dated 05/29/2018 and 04/10/2018. FINDINGS: Heart size and mediastinal contours are stable. Lungs are clear. No pleural effusion or pneumothorax seen. No acute or suspicious osseous finding. LEFT shoulder arthroplasty hardware in place. IMPRESSION: No active disease. No evidence of pneumonia or pulmonary edema. Electronically Signed   By: Franki Cabot M.D.   On: 07/14/2018 16:01      IMPRESSION AND PLAN:   Sepsis.  Unclear etiology. The patient will be admitted to medical floor. Continue antibiotics, follow-up CBC and blood culture, follow-up abdominal CT.  Hypotension, possible septic shock. Continue normal saline bolus.  Vasopressor PRN.  Hyponatremia.  Normal saline IV and follow-up BMP. Possible bronchitis.  Robitussin as needed. Persistent dysphasia after esophageal narrowing and dilation.  Follow-up with  Dr. Dahlia Byes as outpatient. Tobacco abuse.  Smoking cessation was counseled for 3 to 4 minutes. Constipation.  Colace twice daily, senna and Dulcolax as needed. All the records are reviewed and case discussed with ED provider. Management plans discussed with the patient, family and they are in agreement.  CODE STATUS: DNR.  TOTAL TIME TAKING CARE OF THIS PATIENT: 46 minutes.    Demetrios Loll M.D on 07/14/2018 at 7:42 PM  Between 7am to 6pm - Pager - [phone]  After 6pm go to [email or web address] - Proofreader  [facility]  Office  [phone]  CC: Primary care physician; Guadalupe Maple, MD   Note: This dictation was prepared with Dragon dictation along with smaller phrase technology. Any transcriptional errors that result from this  process are unin

## 2018-07-14 NOTE — ED Notes (Signed)
Pt to ct scan.

## 2018-07-14 NOTE — Progress Notes (Signed)
Advanced Care Plan.  Purpose of Encounter: CODE STATUS. Parties in Attendance: Patient and me. Patient's Decisional Capacity: Yes. Medical Story: Chelsey [name]  is a 68 y.o. female with a known history of breast cancer, bronchitis, GERD, hiatal hernia, hyperlipidemia and low BP.  The patient is being admitted for sepsis.  I discussed with patient about her current condition, prognosis and CODE STATUS.  The patient initially does not want to be resuscitation if she has cardiopulmonary rest.  Then she said she wants to be resuscitated.  Then I asked her again to make sure she understand.  She changed her mind and stated that she does not want to be resuscitated or intubated if she has cardiopulmonary arrest. Plan:  Code Status: DNR. Time spent discussing advance care planning: 18 minutes.

## 2018-07-14 NOTE — ED Provider Notes (Signed)
[facility] Emergency Department Provider Note   ____________________________________________    I have reviewed the triage vital signs and the nursing notes.   HISTORY  Chief Complaint Fever and Hypotension     HPI Chelsey [name] is a 68 y.o. female who presents with complaints of feeling "like crap "and having a fever last night.  Patient was here for outpatient replacement of G-tube, in February the patient had ventral hernia repair with complications.  Today she reports that last night she had a fever over 100 has been having worsening cough over the last 2 to 3 days with yellow sputum production.  Was was sent to the emergency department for evaluation.  No known exposure to COVID positive patients  Past Medical History:  Diagnosis Date  . Breast cancer ([place]) 1997   right breast, radiation  . Bronchitis    recent/ 06/09/15 had chest xray/ [facility]/resolved  . Cancer [facility]) 1997   right lumpectomy,L/Ad/R   . GERD (gastroesophageal reflux disease)   . History of hiatal hernia   . Hypercholesteremia   . Hyperlipidemia   . Low BP    TYPICALLY RUNS 80'S/60'S  . Panic attack   . Personal history of malignant neoplasm of breast     Patient Active Problem List   Diagnosis Date Noted  . Sepsis ([place]) 07/14/2018  . Depression with suicidal ideation 04/11/2018  . Dehydration 03/28/2018  . Protein-calorie malnutrition, severe 03/11/2018  . S/P repair of paraesophageal hernia 03/09/2018  . Benign neoplasm of ascending colon   . Osteoporosis 07/27/2017  . Tobacco abuse 07/27/2017  . Painful orthopaedic hardware ([place]) 06/24/2017  . Overweight with body mass index (BMI) 25.0-29.9 05/18/2017  . 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 Insomnia 01/12/2017  . Anemia 01/12/2017  . Status post shoulder replacement 12/27/2016  . Pressure injury of skin 12/27/2016  . S/P ORIF (open reduction internal fixation) fracture  11/23/2016  . Proximal humerus fracture 11/23/2016  . Panic attack 08/17/2016  . Abdominal pain 05/21/2016  . Special screening for malignant neoplasms, colon   . Benign neoplasm of descending colon   . Benign neoplasm of sigmoid colon   . Gastroesophageal reflux disease without esophagitis 06/13/2014  . H/O hypercholesterolemia 06/13/2014  . Depression, major, recurrent, moderate ([place]) 06/13/2014  . History of breast cancer 03/12/2013    Past Surgical History:  Procedure Laterality Date  . BICEPT TENODESIS Left 11/23/2016   Procedure: BICEPS TENODESIS;  Surgeon: Leim Fabry, MD;  Location: [facility] ORS;  Service: Orthopedics;  Laterality: Left;  . BREAST EXCISIONAL BIOPSY Right 1997   pos  . BREAST SURGERY Right 1997   lumpectomy  . CHOLECYSTECTOMY N/A 08/06/2016   Procedure: LAPAROSCOPIC CHOLECYSTECTOMY WITH INTRAOPERATIVE CHOLANGIOGRAM;  Surgeon: Christene Lye, MD;  Location: [facility] ORS;  Service: General;  Laterality: N/A;  . COLONOSCOPY  2008  . COLONOSCOPY WITH PROPOFOL N/A 07/21/2015   Procedure: COLONOSCOPY WITH PROPOFOL;  Surgeon: Lucilla Lame, MD;  Location: [place];  Service: Endoscopy;  Laterality: N/A;  . COLONOSCOPY WITH PROPOFOL N/A 01/05/2018   Procedure: COLONOSCOPY WITH PROPOFOL;  Surgeon: Jonathon Bellows, MD;  Location: [facility] ENDOSCOPY;  Service: Gastroenterology;  Laterality: N/A;  . ESOPHAGOGASTRODUODENOSCOPY (EGD) WITH PROPOFOL N/A 06/16/2016   Procedure: ESOPHAGOGASTRODUODENOSCOPY (EGD) WITH PROPOFOL;  Surgeon: Christene Lye, MD;  Location: [facility] ENDOSCOPY;  Service: Endoscopy;  Laterality: N/A;  . ESOPHAGOGASTRODUODENOSCOPY (EGD) WITH PROPOFOL N/A 01/05/2018   Procedure: ESOPHAGOGASTRODUODENOSCOPY (EGD) WITH PROPOFOL;  Surgeon: Vicente Males,  Bailey Mech, MD;  Location: [place];  Service: Gastroenterology;  Laterality: N/A;  . ESOPHAGOGASTRODUODENOSCOPY (EGD) WITH PROPOFOL N/A 06/13/2018   Procedure: ESOPHAGOGASTRODUODENOSCOPY (EGD) WITH PROPOFOL;   Surgeon: Jonathon Bellows, MD;  Location: [facility] ENDOSCOPY;  Service: Gastroenterology;  Laterality: N/A;  . HARDWARE REMOVAL Left 12/27/2016   Procedure: HARDWARE REMOVAL LEFT  PROXIMAL HUMEROUS;  Surgeon: Leim Fabry, MD;  Location: [facility] ORS;  Service: Orthopedics;  Laterality: Left;  . IR GJ TUBE CHANGE  04/21/2018  . NISSEN FUNDOPLICATION N/A 9/62/2297   Procedure: NISSEN FUNDOPLICATION;  Surgeon: Jules Husbands, MD;  Location: [facility] ORS;  Service: General;  Laterality: N/A;  . ORIF HUMERUS FRACTURE Left 11/23/2016   Procedure: OPEN REDUCTION INTERNAL FIXATION (ORIF) PROXIMAL HUMERUS FRACTURE;  Surgeon: Leim Fabry, MD;  Location: [facility];  Service: Orthopedics;  Laterality: Left;  . REPAIR OF ESOPHAGUS  03/09/2018   Procedure: REPAIR OF ESOPHAGUS;  Surgeon: Jules Husbands, MD;  Location: [facility];  Service: General;;  . REVERSE SHOULDER ARTHROPLASTY Left 12/27/2016   Procedure: REVERSE SHOULDER ARTHROPLASTY;  Surgeon: Leim Fabry, MD;  Location: [facility];  Service: Orthopedics;  Laterality: Left;  . ROBOTIC ASSISTED LAPAROSCOPIC REPAIR OF PARAESOPHAGEAL HERNIA N/A 03/09/2018   Procedure: ROBOTIC HIATAL HERNIA CONVERTED TO OPEN;  Surgeon: Jules Husbands, MD;  Location: [facility] ORS;  Service: General;  Laterality: N/A;    Prior to Admission medications   Medication Sig Start Date End Date Taking? Authorizing Provider  busPIRone (BUSPAR) 10 MG tablet Take 1 tablet (10 mg total) by mouth 2 (two) times daily. 05/29/18  Yes Rainey Pines, MD  Calcium Carbonate-Vitamin D (CALCIUM + D PO) Take 1 tablet daily by mouth.    Yes [provider]  Cholecalciferol (VITAMIN D) 2000 units tablet Take 2,000 Units by mouth daily.   Yes [provider]  lidocaine (XYLOCAINE) 5 % ointment Apply 1 application topically 3 (three) times daily as needed. 04/18/18  Yes Piscoya, Jacqulyn Bath, MD  Multiple Vitamin (MULTIVITAMIN) tablet Take 1 tablet daily by mouth.    Yes [provider]  Nutritional Supplements  (FEEDING SUPPLEMENT, OSMOLITE 1.5 CAL,) LIQD Place 237 mLs into feeding tube 4 (four) times daily. 04/04/18  Yes Tylene Fantasia, PA-C  omeprazole (PRILOSEC) 20 MG capsule Take 1 capsule (20 mg total) by mouth 2 (two) times daily before a meal. 07/27/17  Yes Kathrine Haddock, NP  oxyCODONE-acetaminophen (PERCOCET) 7.5-325 MG tablet Take 1 tablet by mouth every 4 (four) hours as needed for severe pain. Patient taking differently: Take 1 tablet by mouth daily.  07/10/18  Yes Pabon, Diego F, MD  PARoxetine (PAXIL) 20 MG tablet Take 1 tablet (20 mg total) by mouth daily before supper. 05/29/18  Yes Rainey Pines, MD  Simethicone (GAS-X PO) Take 1-2 tablets by mouth daily as needed (gas).   Yes [provider]  simvastatin (ZOCOR) 20 MG tablet TAKE ONE TABLET BY MOUTH AT BEDTIME 12/16/17  Yes Kathrine Haddock, NP  traZODone (DESYREL) 100 MG tablet Take 1 tablet (100 mg total) by mouth at bedtime. 05/29/18  Yes Rainey Pines, MD  ibuprofen (ADVIL,MOTRIN) 200 MG tablet Take 200-400 mg by mouth daily as needed for headache or moderate pain.    [provider]     Allergies Patient has no known allergies.  Family History  Problem Relation Age of Onset  . Anxiety disorder Brother   . Obesity Brother   . COPD Brother   . Kidney disease Mother   . Heart attack Father   .  Hypertension Father   . Alcohol abuse Father   . Depression Brother   . Anxiety disorder Brother   . Breast cancer Maternal Grandmother     Social History Social History   Tobacco Use  . Smoking status: Current Some Day Smoker    Packs/day: 0.10    Years: 10.00    Pack years: 1.00    Types: Cigarettes    Start date: 11/25/2009  . Smokeless tobacco: Never Used  . Tobacco comment: 1 cigarette daily  Substance Use Topics  . Alcohol use: No    Alcohol/week: 0.0 standard drinks  . Drug use: No    Review of Systems  Constitutional: Positive fever Eyes: No visual changes.  ENT: No sore throat. Cardiovascular:  Denies chest pain. Respiratory: Positive cough Gastrointestinal: Mild chronic abdominal pain over the last several months Genitourinary: Negative for dysuria. Musculoskeletal: Negative for back pain. Skin: Negative for rash. Neurological: Negative for headaches   ____________________________________________   PHYSICAL EXAM:  VITAL SIGNS: ED Triage Vitals  Enc Vitals Group     BP 07/14/18 1325 (!) 83/43     Pulse Rate 07/14/18 1325 98     Resp 07/14/18 1325 18     Temp 07/14/18 1325 99.3 F (37.4 C)     Temp Source 07/14/18 1325 Oral     SpO2 07/14/18 1325 97 %     Weight 07/14/18 1327 50.8 kg (112 lb)     Height 07/14/18 1327 1.651 m (5\' 5" )     Head Circumference --      Peak Flow --      Pain Score 07/14/18 1326 0     Pain Loc --      Pain Edu? --      Excl. in [place]? --     Constitutional: Alert and oriented. No acute distress. Pleasant and interactive Eyes: Conjunctivae are normal.   Nose: No congestion/rhinnorhea. Mouth/Throat: Mucous membranes are moist.    Cardiovascular: Normal rate, regular rhythm. Grossly normal heart sounds.  Good peripheral circulation. Respiratory: Normal respiratory effort.  No retractions.  Bibasilar Rales Gastrointestinal: G-tube noted, vertical abdominal scar no evidence of infection, no significant domino tenderness to palpation Musculoskeletal: No lower extremity tenderness nor edema.  Warm and well perfused Neurologic:  Normal speech and language. No gross focal neurologic deficits are appreciated.  Skin:  Skin is warm, dry and intact. No rash noted. Psychiatric: Mood and affect are normal. Speech and behavior are normal.  ____________________________________________   LABS (all labs ordered are listed, but only abnormal results are displayed)  Labs Reviewed  CBC - Abnormal; Notable for the following components:      Result Value   WBC 20.4 (*)    Hemoglobin 11.4 (*)    HCT 35.4 (*)    Platelets 457 (*)    All other  components within normal limits  COMPREHENSIVE METABOLIC PANEL - Abnormal; Notable for the following components:   Sodium 129 (*)    Chloride 90 (*)    Glucose, Bld 121 (*)    Calcium 8.8 (*)    Albumin 2.8 (*)    ALT 49 (*)    Alkaline Phosphatase 215 (*)    All other components within normal limits  URINALYSIS, COMPLETE (UACMP) WITH MICROSCOPIC - Abnormal; Notable for the following components:   Color, Urine STRAW (*)    APPearance CLEAR (*)    Bacteria, UA FEW (*)    All other components within normal limits  SARS CORONAVIRUS 2 (HOSPITAL ORDER,  [place] [facility])  CULTURE, BLOOD (ROUTINE X 2)  CULTURE, BLOOD (ROUTINE X 2)  LACTIC ACID, PLASMA   ____________________________________________  EKG   ____________________________________________  RADIOLOGY  Chest x-ray unremarkable ____________________________________________   PROCEDURES  Procedure(s) performed: No  Procedures   Critical Care performed: yes  CRITICAL CARE Performed by: Lavonia Drafts   Total critical care time: 30 minutes  Critical care time was exclusive of separately billable procedures and treating other patients.  Critical care was necessary to treat or prevent imminent or life-threatening deterioration.  Critical care was time spent personally by me on the following activities: development of treatment plan with patient and/or surrogate as well as nursing, discussions with consultants, evaluation of patient's response to treatment, examination of patient, obtaining history from patient or surrogate, ordering and performing treatments and interventions, ordering and review of laboratory studies, ordering and review of radiographic studies, pulse oximetry and re-evaluation of patient's condition.  ____________________________________________   INITIAL IMPRESSION / ASSESSMENT AND PLAN / ED COURSE  Pertinent labs & imaging results that were available during my care of the  patient were reviewed by me and considered in my medical decision making (see chart for details).  She presents with reports of fever and feeling ill, noted to be hypotensive in triage mildly elevated temperature.  Strong concern for sepsis, COVID-19 is also a possibility.  Will start IV fluids 30 mils per kilogram, call code sepsis start broad-spectrum antibiotics  Significantly elevated white blood cell count  CT abdomen pelvis obtained given her complaints of chronic abdominal pain, no acute findings  Discussed with hospitalist for admission    ____________________________________________   FINAL CLINICAL IMPRESSION(S) / ED DIAGNOSES  Final diagnoses:  Sepsis, due to unspecified organism, unspecified whether acute organ dysfunction present [facility])        Note:  This document was prepared using Dragon voice recognition software and may include unintentional dictation errors.   Lavonia Drafts, MD 07/14/18 2019

## 2018-07-14 NOTE — Progress Notes (Signed)
CODE SEPSIS - PHARMACY COMMUNICATION  **Broad Spectrum Antibiotics should be administered within 1 hour of Sepsis diagnosis**  Time Code Sepsis Called/Page Received: 15  Antibiotics Ordered: cefepime and vancomycin   Time of 1st antibiotic administration: cefepime 1702     Simpson,Michael L ,RPh Clinical Pharmacist  07/14/2018  4:26 PM

## 2018-07-14 NOTE — ED Notes (Signed)
Pt's sister notified of wait to see EDP per pt request.

## 2018-07-14 NOTE — Progress Notes (Signed)
Pharmacy Antibiotic Note  Chelsey [name] is a 68 y.o. female admitted on 07/14/2018 with sepsis.  Pharmacy has been consulted for Vancomycin, Cefepime dosing.  Plan: Cefepime 2 gm IV X 1 given on 6/19 @ 1700. Cefepime 2 gm IV Q12H ordered to resume on 6/20 @ 0500.   Vancomycin 1 gm IV X 1 given in ED on 6/19 @ 1700. Vancomycin 1 gm IV Q24H ordered to continue on 6/20 @ 1700. No peak or trough currently ordered.   AUC = 493.8 CrCl = 61 ml/min Ke = 0.055 hr-1 T1/2 = 12.5 hrs VD = 36.6 L   Height: 5\' 5"  (165.1 cm) Weight: 112 lb (50.8 kg) IBW/kg (Calculated) : 57  Temp (24hrs), Avg:99.3 F (37.4 C), Min:99.2 F (37.3 C), Max:99.3 F (37.4 C)  Recent Labs  Lab 07/14/18 1347 07/14/18 1629  WBC 20.4*  --   CREATININE 0.68  --   LATICACIDVEN  --  1.3    Estimated Creatinine Clearance: 54.7 mL/min (by C-G formula based on SCr of 0.68 mg/dL).    No Known Allergies  Antimicrobials this admission:   >>    >>   Dose adjustments this admission:   Microbiology results:  BCx:   UCx:    Sputum:    MRSA PCR:   Thank you for allowing pharmacy to be a part of this patient's care.  Wandell Scullion D 07/14/2018 9:39 PM

## 2018-07-15 ENCOUNTER — Other Ambulatory Visit: Payer: Self-pay

## 2018-07-15 DIAGNOSIS — R4702 Dysphasia: Secondary | ICD-10-CM

## 2018-07-15 DIAGNOSIS — K9423 Gastrostomy malfunction: Secondary | ICD-10-CM

## 2018-07-15 LAB — BASIC METABOLIC PANEL
Anion gap: 8 (ref 5–15)
BUN: 10 mg/dL (ref 8–23)
CO2: 22 mmol/L (ref 22–32)
Calcium: 7.9 mg/dL — ABNORMAL LOW (ref 8.9–10.3)
Chloride: 106 mmol/L (ref 98–111)
Creatinine, Ser: 0.41 mg/dL — ABNORMAL LOW (ref 0.44–1.00)
GFR calc Af Amer: >60 mL/min (ref >60)
GFR calc non Af Amer: >60 mL/min (ref >60)
Glucose, Bld: 91 mg/dL (ref 70–99)
Potassium: 3.8 mmol/L (ref 3.5–5.1)
Sodium: 136 mmol/L (ref 135–145)

## 2018-07-15 LAB — CBC
HCT: 31.1 % — ABNORMAL LOW (ref 36.0–46.0)
Hemoglobin: 9.8 g/dL — ABNORMAL LOW (ref 12.0–15.0)
MCH: 28.6 pg (ref 26.0–34.0)
MCHC: 31.5 g/dL (ref 30.0–36.0)
MCV: 90.7 fL (ref 80.0–100.0)
Platelets: 369 10*3/uL (ref 150–400)
RBC: 3.43 MIL/uL — ABNORMAL LOW (ref 3.87–5.11)
RDW: 13.3 % (ref 11.5–15.5)
WBC: 10.3 10*3/uL (ref 4.0–10.5)
nRBC: 0 % (ref 0.0–0.2)

## 2018-07-15 NOTE — Consult Note (Signed)
Lucilla Lame, MD [address] [place], [place] [address] Phone: [phone] Fax : [phone]  Consultation  Referring Provider:     Dr. Bridgett Larsson Primary Care Physician:  Guadalupe Maple, MD Primary Gastroenterologist:  Dr. Vicente Males         Reason for Consultation:     Dysphasia with leaking PEG tube  Date of Admission:  07/14/2018 Date of Consultation:  07/15/2018         HPI:   Chelsey [name] is a 68 y.o. female who is known to my practice by Dr. Vicente Males after the patient had a Nissen fundoplication with perforation of the esophagus with a resulting stricture.  The patient had an upper endoscopy by Dr. Cleda Mccreedy and due to the severity and length of the stricture the patient was recommended to go to [facility].  The patient had a upper endoscopy earlier this month with dilation of the esophagus with the recommendation for a repeat dilation in 4 weeks.  The patient now comes in with fevers hypotension and chills.  The patient reports that her feeding tube has been leaking.  It appears that the feeding tube was placed by surgery and was a PEG to PEJ.  Upon reviewing the previous endoscopy done in May of this year it appears that most of the J tube was coiled up in the stomach.  I am now being asked to evaluate the patient for the leaking PEG tube and dysphasia.  Past Medical History:  Diagnosis Date  . Breast cancer ([place]) 1997   right breast, radiation  . Bronchitis    recent/ 06/09/15 had chest xray/ [facility]/resolved  . Cancer [facility]) 1997   right lumpectomy,L/Ad/R   . GERD (gastroesophageal reflux disease)   . History of hiatal hernia   . Hypercholesteremia   . Hyperlipidemia   . Low BP    TYPICALLY RUNS 80'S/60'S  . Panic attack   . Personal history of malignant neoplasm of breast     Past Surgical History:  Procedure Laterality Date  . BICEPT TENODESIS Left 11/23/2016   Procedure: BICEPS TENODESIS;  Surgeon: Leim Fabry, MD;  Location: [facility];  Service:  Orthopedics;  Laterality: Left;  . BREAST EXCISIONAL BIOPSY Right 1997   pos  . BREAST SURGERY Right 1997   lumpectomy  . CHOLECYSTECTOMY N/A 08/06/2016   Procedure: LAPAROSCOPIC CHOLECYSTECTOMY WITH INTRAOPERATIVE CHOLANGIOGRAM;  Surgeon: Christene Lye, MD;  Location: [facility];  Service: General;  Laterality: N/A;  . COLONOSCOPY  2008  . COLONOSCOPY WITH PROPOFOL N/A 07/21/2015   Procedure: COLONOSCOPY WITH PROPOFOL;  Surgeon: Lucilla Lame, MD;  Location: [facility];  Service: Endoscopy;  Laterality: N/A;  . COLONOSCOPY WITH PROPOFOL N/A 01/05/2018   Procedure: COLONOSCOPY WITH PROPOFOL;  Surgeon: Jonathon Bellows, MD;  Location: [facility] ENDOSCOPY;  Service: Gastroenterology;  Laterality: N/A;  . ESOPHAGOGASTRODUODENOSCOPY (EGD) WITH PROPOFOL N/A 06/16/2016   Procedure: ESOPHAGOGASTRODUODENOSCOPY (EGD) WITH PROPOFOL;  Surgeon: Christene Lye, MD;  Location: [facility] ENDOSCOPY;  Service: Endoscopy;  Laterality: N/A;  . ESOPHAGOGASTRODUODENOSCOPY (EGD) WITH PROPOFOL N/A 01/05/2018   Procedure: ESOPHAGOGASTRODUODENOSCOPY (EGD) WITH PROPOFOL;  Surgeon: Jonathon Bellows, MD;  Location: [facility] ENDOSCOPY;  Service: Gastroenterology;  Laterality: N/A;  . ESOPHAGOGASTRODUODENOSCOPY (EGD) WITH PROPOFOL N/A 06/13/2018   Procedure: ESOPHAGOGASTRODUODENOSCOPY (EGD) WITH PROPOFOL;  Surgeon: Jonathon Bellows, MD;  Location: [facility] ENDOSCOPY;  Service: Gastroenterology;  Laterality: N/A;  . HARDWARE REMOVAL Left 12/27/2016   Procedure: HARDWARE REMOVAL LEFT  PROXIMAL HUMEROUS;  Surgeon: Leim Fabry,  MD;  Location: [facility];  Service: Orthopedics;  Laterality: Left;  . IR GJ TUBE CHANGE  04/21/2018  . NISSEN FUNDOPLICATION N/A 0/98/1191   Procedure: NISSEN FUNDOPLICATION;  Surgeon: Jules Husbands, MD;  Location: [facility];  Service: General;  Laterality: N/A;  . ORIF HUMERUS FRACTURE Left 11/23/2016   Procedure: OPEN REDUCTION INTERNAL FIXATION (ORIF) PROXIMAL HUMERUS FRACTURE;  Surgeon: Leim Fabry, MD;   Location: [facility];  Service: Orthopedics;  Laterality: Left;  . REPAIR OF ESOPHAGUS  03/09/2018   Procedure: REPAIR OF ESOPHAGUS;  Surgeon: Jules Husbands, MD;  Location: [facility];  Service: General;;  . REVERSE SHOULDER ARTHROPLASTY Left 12/27/2016   Procedure: REVERSE SHOULDER ARTHROPLASTY;  Surgeon: Leim Fabry, MD;  Location: [facility];  Service: Orthopedics;  Laterality: Left;  . ROBOTIC ASSISTED LAPAROSCOPIC REPAIR OF PARAESOPHAGEAL HERNIA N/A 03/09/2018   Procedure: ROBOTIC HIATAL HERNIA CONVERTED TO OPEN;  Surgeon: Jules Husbands, MD;  Location: [facility];  Service: General;  Laterality: N/A;    Prior to Admission medications   Medication Sig Start Date End Date Taking? Authorizing Provider  busPIRone (BUSPAR) 10 MG tablet Take 1 tablet (10 mg total) by mouth 2 (two) times daily. 05/29/18  Yes Rainey Pines, MD  Calcium Carbonate-Vitamin D (CALCIUM + D PO) Take 1 tablet daily by mouth.    Yes [provider]  Cholecalciferol (VITAMIN D) 2000 units tablet Take 2,000 Units by mouth daily.   Yes [provider]  lidocaine (XYLOCAINE) 5 % ointment Apply 1 application topically 3 (three) times daily as needed. 04/18/18  Yes Piscoya, Jacqulyn Bath, MD  Multiple Vitamin (MULTIVITAMIN) tablet Take 1 tablet daily by mouth.    Yes [provider]  Nutritional Supplements (FEEDING SUPPLEMENT, OSMOLITE 1.5 CAL,) LIQD Place 237 mLs into feeding tube 4 (four) times daily. 04/04/18  Yes Tylene Fantasia, PA-C  omeprazole (PRILOSEC) 20 MG capsule Take 1 capsule (20 mg total) by mouth 2 (two) times daily before a meal. 07/27/17  Yes Kathrine Haddock, NP  oxyCODONE-acetaminophen (PERCOCET) 7.5-325 MG tablet Take 1 tablet by mouth every 4 (four) hours as needed for severe pain. Patient taking differently: Take 1 tablet by mouth daily.  07/10/18  Yes Pabon, Diego F, MD  PARoxetine (PAXIL) 20 MG tablet Take 1 tablet (20 mg total) by mouth daily before supper. 05/29/18  Yes Rainey Pines, MD  Simethicone  (GAS-X PO) Take 1-2 tablets by mouth daily as needed (gas).   Yes [provider]  simvastatin (ZOCOR) 20 MG tablet TAKE ONE TABLET BY MOUTH AT BEDTIME 12/16/17  Yes Kathrine Haddock, NP  traZODone (DESYREL) 100 MG tablet Take 1 tablet (100 mg total) by mouth at bedtime. 05/29/18  Yes Rainey Pines, MD  ibuprofen (ADVIL,MOTRIN) 200 MG tablet Take 200-400 mg by mouth daily as needed for headache or moderate pain.    [provider]    Family History  Problem Relation Age of Onset  . Anxiety disorder Brother   . Obesity Brother   . COPD Brother   . Kidney disease Mother   . Heart attack Father   . Hypertension Father   . Alcohol abuse Father   . Depression Brother   . Anxiety disorder Brother   . Breast cancer Maternal Grandmother      Social History   Tobacco Use  . Smoking status: Current Some Day Smoker    Packs/day: 0.10    Years: 10.00    Pack years: 1.00    Types: Cigarettes  Start date: 11/25/2009  . Smokeless tobacco: Never Used  . Tobacco comment: 1 cigarette daily  Substance Use Topics  . Alcohol use: No    Alcohol/week: 0.0 standard drinks  . Drug use: No    Allergies as of 07/14/2018  . (No Known Allergies)    Review of Systems:    All systems reviewed and negative except where noted in HPI.   Physical Exam:  Vital signs in last 24 hours: Temp:  [98.2 F (36.8 C)-99.3 F (37.4 C)] 98.2 F (36.8 C) (06/20 0541) Pulse Rate:  [67-98] 67 (06/20 0751) Resp:  [9-21] 18 (06/20 0541) BP: (81-97)/(43-62) 93/57 (06/20 0751) SpO2:  [89 %-100 %] 100 % (06/20 0541) Weight:  [50.8 kg] 50.8 kg (06/19 1327) Last BM Date: (Per PT several weeks ago) General:   Pleasant, cooperative in NAD Head:  Normocephalic and atraumatic. Eyes:   No icterus.   Conjunctiva pink. PERRLA. Ears:  Normal auditory acuity. Neck:  Supple; no masses or thyroidomegaly Lungs: Decreased breath sounds bilaterally Heart:  Regular rate and rhythm;  Without murmur, clicks, rubs  or gallops Abdomen:  Soft, nondistended, nontender. Normal bowel sounds. No appreciable masses or hepatomegaly.  No rebound or guarding.  The PEG tube appears cracked in multiple spots.  There is also a midline scar. Rectal:  Not performed. Msk:  Symmetrical without gross deformities.    Extremities:  Without edema, cyanosis or clubbing. Neurologic:  Alert and oriented x3;  grossly normal neurologically. Skin:  Intact without significant lesions or rashes. Cervical Nodes:  No significant cervical adenopathy. Psych:  Alert and cooperative. Normal affect.  LAB RESULTS: Recent Labs    07/14/18 1347 07/15/18 0704  WBC 20.4* 10.3  HGB 11.4* 9.8*  HCT 35.4* 31.1*  PLT 457* 369   BMET Recent Labs    07/14/18 1347 07/15/18 0704  NA 129* 136  K 4.4 3.8  CL 90* 106  CO2 27 22  GLUCOSE 121* 91  BUN 14 10  CREATININE 0.68 0.41*  CALCIUM 8.8* 7.9*   LFT Recent Labs    07/14/18 1347  PROT 6.8  ALBUMIN 2.8*  AST 32  ALT 49*  ALKPHOS 215*  BILITOT 1.1   PT/INR Recent Labs    07/14/18 2138  LABPROT 14.6  INR 1.2    STUDIES: Ct Abdomen Pelvis W Contrast  Result Date: 07/14/2018 CLINICAL DATA:  Fever. History of paraesophageal hernia repair and Nissen fundoplication in February 2020. EXAM: CT ABDOMEN AND PELVIS WITH CONTRAST TECHNIQUE: Multidetector CT imaging of the abdomen and pelvis was performed using the standard protocol following bolus administration of intravenous contrast. CONTRAST:  67mL OMNIPAQUE IOHEXOL 350 MG/ML SOLN COMPARISON:  CT scan 03/16/2018 FINDINGS: Lower chest: Stable large masslike density adjacent to the distal esophagus in the posterior mediastinum and left pleural space. This is likely the old hernia sac. On the prior study was filled with fluid nodes collapsed and contains some air and fluid and enhancing mucosa. There is also some air in the Nissen wrap but no complicating features. Small left pleural effusion with overlying atelectasis. The right  lung base is clear. Hepatobiliary: No focal hepatic lesions. Mild stable intra and extrahepatic biliary dilatation status post cholecystectomy. Pancreas: No mass, inflammation or ductal dilatation. Spleen: Normal size.  No focal lesions. Adrenals/Urinary Tract: The adrenal glands and kidneys are grossly normal in stable. Bladder is grossly normal. Stomach/Bowel: There is a feeding gastrojejunostomy tube in place. No complicating features are identified. The small bowel and colon are grossly  normal. No obstructive findings. Vascular/Lymphatic: Stable atherosclerotic calcifications involving the aorta but no aneurysm or dissection. The branch vessels are patent. The major venous structures are patent. No mesenteric or retroperitoneal mass or adenopathy. Reproductive: The uterus and ovaries are unremarkable. Other: No pelvic mass or free pelvic fluid collections. No inguinal mass or adenopathy. Musculoskeletal: No significant bony findings. Stable degenerative changes involving the spine. IMPRESSION: 1. Stable large complex "mass" in the posterior mediastinum adjacent to the esophagus likely the old hernia sac. 2. Small left pleural effusion and overlying atelectasis. 3. Feeding gastrojejunostomy tube in place without complicating features. 4. No acute abdominal/pelvic findings, mass lesions or adenopathy. Electronically Signed   By: Marijo Sanes M.D.   On: 07/14/2018 20:03   Dg Chest Portable 1 View  Result Date: 07/14/2018 CLINICAL DATA:  Fever. EXAM: PORTABLE CHEST 1 VIEW COMPARISON:  Chest x-rays dated 05/29/2018 and 04/10/2018. FINDINGS: Heart size and mediastinal contours are stable. Lungs are clear. No pleural effusion or pneumothorax seen. No acute or suspicious osseous finding. LEFT shoulder arthroplasty hardware in place. IMPRESSION: No active disease. No evidence of pneumonia or pulmonary edema. Electronically Signed   By: Franki Cabot M.D.   On: 07/14/2018 16:01      Impression / Plan:    Assessment: Active Problems:   Sepsis ([facility])   Chelsey [name] is a 68 y.o. y/o female with dysphasia with a recent EGD at [facility] that recommended a repeat EGD 4 weeks after the the previous one for repeat dilation.  That is not until next month.  Repeating the upper endoscopy too early with dilation risks perforation.  The patient also has a PEG tube with multiple cracks in it.  Plan:  The patient will follow-up at [facility] for her repeat dilation as per their recommendations.  Repeating the EGD with dilation at this time risks esophageal perforation.  The patient will have her PEG to PEJ converted to a PEG due to the jejunal tube being called up in the stomach already and not functioning as no feeding anyway.  Thank you for involving me in the care of this patient.      LOS: 1 day   Lucilla Lame, MD  07/15/2018, 11:32 AM    Note: This dictation was prepared with Dragon dictation along with smaller phrase technology. Any transcriptional errors that result from this process are unintentional.

## 2018-07-15 NOTE — Consult Note (Signed)
SURGICAL CONSULTATION NOTE   HISTORY OF PRESENT ILLNESS (HPI):  68 y.o. female presented to [facility] ED for evaluation of fever, coughing, and abdominal pain.. Patient reports he has been having fever and coughing with greenish sputum in the last few days.  She also reports abdominal pain.  Abdominal pain has been reports no change many months ago.  He denies any new changes in her abdominal pain.  Her abdominal pain is reported as generalized, patient cannot specify or localized the pain.  Pain does not radiate to other part of the body.  There is no alleviating or aggravating factor.  Patient also reports having nausea every day.  Patient has a very complex history with repair of paraesophageal hernia on February 2020.  She recovered from surgery but has been complaining of abdominal pain since then.  She was found to have esophageal stenosis that was treated with dilation at [facility].  This has not improved her pain or nausea.  Patient has been seen by Dr. Dahlia Byes who has been following her as outpatient has been very thorough in work-up of this patient has not been able to identify a clear etiology for her abdominal pain.  Today patient has had lunch and supper (full liquids) and has tolerated well.  At the moment of my evaluation she was just finishing soup and ice cream and there was no nausea.  Patient does report having constipation for more than a week.  Surgery is consulted by Dr. Manuella Ghazi in this context for evaluation and management of abdominal pain.  PAST MEDICAL HISTORY (PMH):  Past Medical History:  Diagnosis Date  . Breast cancer ([place]) 1997   right breast, radiation  . Bronchitis    recent/ 06/09/15 had chest xray/ [facility]/resolved  . Cancer [facility]) 1997   right lumpectomy,L/Ad/R   . GERD (gastroesophageal reflux disease)   . History of hiatal hernia   . Hypercholesteremia   . Hyperlipidemia   . Low BP    TYPICALLY RUNS 80'S/60'S  . Panic attack   . Personal history of  malignant neoplasm of breast      PAST SURGICAL HISTORY ([place]):  Past Surgical History:  Procedure Laterality Date  . BICEPT TENODESIS Left 11/23/2016   Procedure: BICEPS TENODESIS;  Surgeon: Leim Fabry, MD;  Location: [facility] ORS;  Service: Orthopedics;  Laterality: Left;  . BREAST EXCISIONAL BIOPSY Right 1997   pos  . BREAST SURGERY Right 1997   lumpectomy  . CHOLECYSTECTOMY N/A 08/06/2016   Procedure: LAPAROSCOPIC CHOLECYSTECTOMY WITH INTRAOPERATIVE CHOLANGIOGRAM;  Surgeon: Christene Lye, MD;  Location: [facility] ORS;  Service: General;  Laterality: N/A;  . COLONOSCOPY  2008  . COLONOSCOPY WITH PROPOFOL N/A 07/21/2015   Procedure: COLONOSCOPY WITH PROPOFOL;  Surgeon: Lucilla Lame, MD;  Location: [place];  Service: Endoscopy;  Laterality: N/A;  . COLONOSCOPY WITH PROPOFOL N/A 01/05/2018   Procedure: COLONOSCOPY WITH PROPOFOL;  Surgeon: Jonathon Bellows, MD;  Location: [facility] ENDOSCOPY;  Service: Gastroenterology;  Laterality: N/A;  . ESOPHAGOGASTRODUODENOSCOPY (EGD) WITH PROPOFOL N/A 06/16/2016   Procedure: ESOPHAGOGASTRODUODENOSCOPY (EGD) WITH PROPOFOL;  Surgeon: Christene Lye, MD;  Location: [facility] ENDOSCOPY;  Service: Endoscopy;  Laterality: N/A;  . ESOPHAGOGASTRODUODENOSCOPY (EGD) WITH PROPOFOL N/A 01/05/2018   Procedure: ESOPHAGOGASTRODUODENOSCOPY (EGD) WITH PROPOFOL;  Surgeon: Jonathon Bellows, MD;  Location: [facility] ENDOSCOPY;  Service: Gastroenterology;  Laterality: N/A;  . ESOPHAGOGASTRODUODENOSCOPY (EGD) WITH PROPOFOL N/A 06/13/2018   Procedure: ESOPHAGOGASTRODUODENOSCOPY (EGD) WITH PROPOFOL;  Surgeon: Jonathon Bellows, MD;  Location[facility] ENDOSCOPY;  Service: Gastroenterology;  Laterality: N/A;  . HARDWARE REMOVAL Left 12/27/2016   Procedure: HARDWARE REMOVAL LEFT  PROXIMAL HUMEROUS;  Surgeon: Leim Fabry, MD;  Location: [facility] ORS;  Service: Orthopedics;  Laterality: Left;  . IR GJ TUBE CHANGE  04/21/2018  . NISSEN FUNDOPLICATION N/A 3/81/0175   Procedure: NISSEN FUNDOPLICATION;   Surgeon: Jules Husbands, MD;  Location: [facility] ORS;  Service: General;  Laterality: N/A;  . ORIF HUMERUS FRACTURE Left 11/23/2016   Procedure: OPEN REDUCTION INTERNAL FIXATION (ORIF) PROXIMAL HUMERUS FRACTURE;  Surgeon: Leim Fabry, MD;  Location: [facility] ORS;  Service: Orthopedics;  Laterality: Left;  . REPAIR OF ESOPHAGUS  03/09/2018   Procedure: REPAIR OF ESOPHAGUS;  Surgeon: Jules Husbands, MD;  Location: [facility] ORS;  Service: General;;  . REVERSE SHOULDER ARTHROPLASTY Left 12/27/2016   Procedure: REVERSE SHOULDER ARTHROPLASTY;  Surgeon: Leim Fabry, MD;  Location: [facility] ORS;  Service: Orthopedics;  Laterality: Left;  . ROBOTIC ASSISTED LAPAROSCOPIC REPAIR OF PARAESOPHAGEAL HERNIA N/A 03/09/2018   Procedure: ROBOTIC HIATAL HERNIA CONVERTED TO OPEN;  Surgeon: Jules Husbands, MD;  Location: [facility] ORS;  Service: General;  Laterality: N/A;     MEDICATIONS:  Prior to Admission medications   Medication Sig Start Date End Date Taking? Authorizing Provider  busPIRone (BUSPAR) 10 MG tablet Take 1 tablet (10 mg total) by mouth 2 (two) times daily. 05/29/18  Yes Rainey Pines, MD  Calcium Carbonate-Vitamin D (CALCIUM + D PO) Take 1 tablet daily by mouth.    Yes [provider]  Cholecalciferol (VITAMIN D) 2000 units tablet Take 2,000 Units by mouth daily.   Yes [provider]  lidocaine (XYLOCAINE) 5 % ointment Apply 1 application topically 3 (three) times daily as needed. 04/18/18  Yes Piscoya, Jacqulyn Bath, MD  Multiple Vitamin (MULTIVITAMIN) tablet Take 1 tablet daily by mouth.    Yes [provider]  Nutritional Supplements (FEEDING SUPPLEMENT, OSMOLITE 1.5 CAL,) LIQD Place 237 mLs into feeding tube 4 (four) times daily. 04/04/18  Yes Tylene Fantasia, PA-C  omeprazole (PRILOSEC) 20 MG capsule Take 1 capsule (20 mg total) by mouth 2 (two) times daily before a meal. 07/27/17  Yes Kathrine Haddock, NP  oxyCODONE-acetaminophen (PERCOCET) 7.5-325 MG tablet Take 1 tablet by mouth every 4 (four) hours  as needed for severe pain. Patient taking differently: Take 1 tablet by mouth daily.  07/10/18  Yes Pabon, Diego F, MD  PARoxetine (PAXIL) 20 MG tablet Take 1 tablet (20 mg total) by mouth daily before supper. 05/29/18  Yes Rainey Pines, MD  Simethicone (GAS-X PO) Take 1-2 tablets by mouth daily as needed (gas).   Yes [provider]  simvastatin (ZOCOR) 20 MG tablet TAKE ONE TABLET BY MOUTH AT BEDTIME 12/16/17  Yes Kathrine Haddock, NP  traZODone (DESYREL) 100 MG tablet Take 1 tablet (100 mg total) by mouth at bedtime. 05/29/18  Yes Rainey Pines, MD  ibuprofen (ADVIL,MOTRIN) 200 MG tablet Take 200-400 mg by mouth daily as needed for headache or moderate pain.    [provider]     ALLERGIES:  No Known Allergies   SOCIAL HISTORY:  Social History   Socioeconomic History  . Marital status: Widowed    Spouse name: Not on file  . Number of children: 2  . Years of education: Not on file  . Highest education level: Bachelor's degree (e.g., BA, AB, BS)  Occupational History  . Not on file  Social Needs  . Financial resource strain: Hard  . Food insecurity    Worry: Never true  Inability: Never true  . Transportation needs    Medical: No    Non-medical: No  Tobacco Use  . Smoking status: Current Some Day Smoker    Packs/day: 0.10    Years: 10.00    Pack years: 1.00    Types: Cigarettes    Start date: 11/25/2009  . Smokeless tobacco: Never Used  . Tobacco comment: 1 cigarette daily  Substance and Sexual Activity  . Alcohol use: No    Alcohol/week: 0.0 standard drinks  . Drug use: No  . Sexual activity: Never  Lifestyle  . Physical activity    Days per week: 0 days    Minutes per session: 0 min  . Stress: Very much  Relationships  . Social Herbalist on phone: Not on file    Gets together: Not on file    Attends religious service: Never    Active member of club or organization: No    Attends meetings of clubs or organizations: Never     Relationship status: Widowed  . Intimate partner violence    Fear of current or ex partner: No    Emotionally abused: No    Physically abused: No    Forced sexual activity: No  Other Topics Concern  . Not on file  Social History Narrative  . Not on file    The patient currently resides (home / rehab facility / nursing home): Home The patient normally is (ambulatory / bedbound): Ambulatory   FAMILY HISTORY:  Family History  Problem Relation Age of Onset  . Anxiety disorder Brother   . Obesity Brother   . COPD Brother   . Kidney disease Mother   . Heart attack Father   . Hypertension Father   . Alcohol abuse Father   . Depression Brother   . Anxiety disorder Brother   . Breast cancer Maternal Grandmother      REVIEW OF SYSTEMS:  Constitutional: denies weight loss, chills, or sweats.  Positive for fever Eyes: denies any other vision changes, history of eye injury  ENT: denies sore throat, hearing problems  Respiratory: denies shortness of breath, wheezing positive for coughing Cardiovascular: denies chest pain, palpitations  Gastrointestinal: Positive abdominal pain, N/V, positive for constipation Genitourinary: denies burning with urination or urinary frequency Musculoskeletal: denies any other joint pains or cramps  Skin: denies any other rashes or skin discolorations  Neurological: denies any other headache, dizziness, weakness  Psychiatric: denies any other depression, anxiety   All other review of systems were negative   VITAL SIGNS:  Temp:  [97.6 F (36.4 C)-99.2 F (37.3 C)] 97.6 F (36.4 C) (06/20 1304) Pulse Rate:  [65-83] 65 (06/20 1304) Resp:  [11-21] 18 (06/20 1304) BP: (81-95)/(50-62) 92/50 (06/20 1452) SpO2:  [89 %-100 %] 100 % (06/20 1304)     Height: 5\' 5"  (165.1 cm) Weight: 50.8 kg BMI (Calculated): 18.64   INTAKE/OUTPUT:  This shift: Total I/O In: 1517.1 [P.O.:300; I.V.:1017.1; IV Piggyback:200] Out: 250 [Urine:250]  Last 2 shifts:  @IOLAST2SHIFTS @   PHYSICAL EXAM:  Constitutional:  -- Normal body habitus  -- Awake, alert, and oriented x3  Eyes:  -- Pupils equally round and reactive to light  -- No scleral icterus  Ear, nose, and throat:  -- No jugular venous distension  Pulmonary:  -- No crackles  -- Equal breath sounds bilaterally -- Breathing non-labored at rest Cardiovascular:  -- S1, S2 present  -- No pericardial rubs Gastrointestinal:  -- Abdomen soft, nontender, non-distended, no guarding  or rebound tenderness -- No abdominal masses appreciated, pulsatile or otherwise  --Gastrostomy tube in place.  Nonfunctioning --Multiple abdominal scar, healed.  No sign of cellulitis, erythema or secretions per wounds.  There is no sign of fluid collection. Musculoskeletal and Integumentary:  -- Wounds or skin discoloration: None appreciated -- Extremities: B/L UE and LE FROM, hands and feet warm, no edema  Neurologic:  -- Motor function: intact and symmetric -- Sensation: intact and symmetric   Labs:  CBC Latest Ref Rng & Units 07/15/2018 07/14/2018 04/11/2018  WBC 4.0 - 10.5 K/uL 10.3 20.4(H) 10.3  Hemoglobin 12.0 - 15.0 g/dL 9.8(L) 11.4(L) 13.2  Hematocrit 36.0 - 46.0 % 31.1(L) 35.4(L) 40.8  Platelets 150 - 400 K/uL 369 457(H) 623(H)   CMP Latest Ref Rng & Units 07/15/2018 07/14/2018 04/11/2018  Glucose 70 - 99 mg/dL 91 121(H) 112(H)  BUN 8 - 23 mg/dL 10 14 16   Creatinine 0.44 - 1.00 mg/dL 0.41(L) 0.68 0.61  Sodium 135 - 145 mmol/L 136 129(L) 135  Potassium 3.5 - 5.1 mmol/L 3.8 4.4 4.6  Chloride 98 - 111 mmol/L 106 90(L) 96(L)  CO2 22 - 32 mmol/L 22 27 25   Calcium 8.9 - 10.3 mg/dL 7.9(L) 8.8(L) 9.7  Total Protein 6.5 - 8.1 g/dL - 6.8 8.4(H)  Total Bilirubin 0.3 - 1.2 mg/dL - 1.1 0.7  Alkaline Phos 38 - 126 U/L - 215(H) 430(H)  AST 15 - 41 U/L - 32 34  ALT 0 - 44 U/L - 49(H) 84(H)    Imaging studies:  EXAM: CT ABDOMEN AND PELVIS WITH CONTRAST  TECHNIQUE: Multidetector CT imaging of the abdomen  and pelvis was performed using the standard protocol following bolus administration of intravenous contrast.  CONTRAST:  39mL OMNIPAQUE IOHEXOL 350 MG/ML SOLN  COMPARISON:  CT scan 03/16/2018  FINDINGS: Lower chest: Stable large masslike density adjacent to the distal esophagus in the posterior mediastinum and left pleural space. This is likely the old hernia sac. On the prior study was filled with fluid nodes collapsed and contains some air and fluid and enhancing mucosa. There is also some air in the Nissen wrap but no complicating features. Small left pleural effusion with overlying atelectasis. The right lung base is clear.  Hepatobiliary: No focal hepatic lesions. Mild stable intra and extrahepatic biliary dilatation status post cholecystectomy.  Pancreas: No mass, inflammation or ductal dilatation.  Spleen: Normal size.  No focal lesions.  Adrenals/Urinary Tract: The adrenal glands and kidneys are grossly normal in stable. Bladder is grossly normal.  Stomach/Bowel: There is a feeding gastrojejunostomy tube in place. No complicating features are identified.  The small bowel and colon are grossly normal. No obstructive findings.  Vascular/Lymphatic: Stable atherosclerotic calcifications involving the aorta but no aneurysm or dissection. The branch vessels are patent. The major venous structures are patent. No mesenteric or retroperitoneal mass or adenopathy.  Reproductive: The uterus and ovaries are unremarkable.  Other: No pelvic mass or free pelvic fluid collections. No inguinal mass or adenopathy.  Musculoskeletal: No significant bony findings. Stable degenerative changes involving the spine.  IMPRESSION: 1. Stable large complex "mass" in the posterior mediastinum adjacent to the esophagus likely the old hernia sac. 2. Small left pleural effusion and overlying atelectasis. 3. Feeding gastrojejunostomy tube in place without  complicating features. 4. No acute abdominal/pelvic findings, mass lesions or adenopathy.   Electronically Signed   By: Marijo Sanes M.D.   On: 07/14/2018 20:03   Assessment/Plan:  68 y.o. female with suspected sepsis of unknown etiology  Patient without very complicated history of chronic pain and chronic nausea of unknown etiology.  She was admitted with fever and hypotension and with leukocytosis and reporting that she was having cough.  X-ray was negative for pneumonia.  I personally evaluated the images.  Intra-abdominal etiology was suspected.  CT scan of the abdomen and pelvis was done without any significant pathology.  I personally evaluated the images.  There is no intra-abdominal abscess.  There is no extraluminal air.  There is no fat stranding or any sign of intra-abdominal infection.  At this moment there is no surgical management for this patient.  Patient has been extensively evaluated by Dr. Dahlia Byes to has rule out all the possible complications of this patient surgical procedure and everything has been negative.  There was a cell count today decreased to normal levels.  Patient is not coughing.  Patient is nontender to palpation in the abdomen.  Patient has a nonworking gastrostomy tube.  Planning of endoscopy and possible exchange of endoscopy tube tomorrow.  Patient also being followed at [facility] for esophageal dilation.  I agree with GI that there is no indication for an additional dilation at this moment.  I agree with current management of this patient.  Will remain aware of the patient in case assistant is needed in her work-up and management.  Arnold Long, MD

## 2018-07-15 NOTE — Plan of Care (Addendum)
Patient doing well today.  She has tolerated full liquid diet.  She has not wanted her tube feedings since her tube has been leaking.  Voiding ok.  BP still on the low side.  No significant changes.  I spoke to the patient's sister and gave her an update.

## 2018-07-15 NOTE — Progress Notes (Addendum)
[place] at [place] NAME: Chelsey [name]    MR#:  [id number]  DATE OF BIRTH:  1950-12-20  SUBJECTIVE:  CHIEF COMPLAINT:   Chief Complaint  Patient presents with   Fever   Hypotension  Not feeling well complains of some abdominal pain, remains hypotensive REVIEW OF SYSTEMS:  Review of Systems  Constitutional: Negative for diaphoresis, fever, malaise/fatigue and weight loss.  HENT: Negative for ear discharge, ear pain, hearing loss, nosebleeds, sore throat and tinnitus.   Eyes: Negative for blurred vision and pain.  Respiratory: Negative for cough, hemoptysis, shortness of breath and wheezing.   Cardiovascular: Negative for chest pain, palpitations, orthopnea and leg swelling.  Gastrointestinal: Positive for abdominal pain. Negative for blood in stool, constipation, diarrhea, heartburn, nausea and vomiting.  Genitourinary: Negative for dysuria, frequency and urgency.  Musculoskeletal: Negative for back pain and myalgias.  Skin: Negative for itching and rash.  Neurological: Negative for dizziness, tingling, tremors, focal weakness, seizures, weakness and headaches.  Psychiatric/Behavioral: Negative for depression. The patient is not nervous/anxious.     DRUG ALLERGIES:  No Known Allergies VITALS:  Blood pressure (!) 93/57, pulse 67, temperature 98.2 F (36.8 C), temperature source Oral, resp. rate 18, height 5\' 5"  (1.651 m), weight 50.8 kg, SpO2 100 %. PHYSICAL EXAMINATION:  Physical Exam HENT:     Head: Normocephalic and atraumatic.  Eyes:     Conjunctiva/sclera: Conjunctivae normal.     Pupils: Pupils are equal, round, and reactive to light.  Neck:     Musculoskeletal: Normal range of motion and neck supple.     Thyroid: No thyromegaly.     Trachea: No tracheal deviation.  Cardiovascular:     Rate and Rhythm: Normal rate and regular rhythm.     Heart sounds: Normal heart sounds.  Pulmonary:     Effort: Pulmonary effort is  normal. No respiratory distress.     Breath sounds: Normal breath sounds. No wheezing.  Chest:     Chest wall: No tenderness.  Abdominal:     General: Bowel sounds are normal. There is no distension.     Palpations: Abdomen is soft.     Tenderness: There is no abdominal tenderness.  Musculoskeletal: Normal range of motion.  Skin:    General: Skin is warm and dry.     Findings: No rash.  Neurological:     Mental Status: She is alert and oriented to person, place, and time.     Cranial Nerves: No cranial nerve deficit.    LABORATORY PANEL:  Female CBC Recent Labs  Lab 07/15/18 0704  WBC 10.3  HGB 9.8*  HCT 31.1*  PLT 369   ------------------------------------------------------------------------------------------------------------------ Chemistries  Recent Labs  Lab 07/14/18 1347 07/15/18 0704  NA 129* 136  K 4.4 3.8  CL 90* 106  CO2 27 22  GLUCOSE 121* 91  BUN 14 10  CREATININE 0.68 0.41*  CALCIUM 8.8* 7.9*  AST 32  --   ALT 49*  --   ALKPHOS 215*  --   BILITOT 1.1  --    RADIOLOGY:  Ct Abdomen Pelvis W Contrast  Result Date: 07/14/2018 CLINICAL DATA:  Fever. History of paraesophageal hernia repair and Nissen fundoplication in February 2020. EXAM: CT ABDOMEN AND PELVIS WITH CONTRAST TECHNIQUE: Multidetector CT imaging of the abdomen and pelvis was performed using the standard protocol following bolus administration of intravenous contrast. CONTRAST:  2mL OMNIPAQUE IOHEXOL 350 MG/ML SOLN COMPARISON:  CT scan 03/16/2018 FINDINGS: Lower chest:  Stable large masslike density adjacent to the distal esophagus in the posterior mediastinum and left pleural space. This is likely the old hernia sac. On the prior study was filled with fluid nodes collapsed and contains some air and fluid and enhancing mucosa. There is also some air in the Nissen wrap but no complicating features. Small left pleural effusion with overlying atelectasis. The right lung base is clear. Hepatobiliary: No  focal hepatic lesions. Mild stable intra and extrahepatic biliary dilatation status post cholecystectomy. Pancreas: No mass, inflammation or ductal dilatation. Spleen: Normal size.  No focal lesions. Adrenals/Urinary Tract: The adrenal glands and kidneys are grossly normal in stable. Bladder is grossly normal. Stomach/Bowel: There is a feeding gastrojejunostomy tube in place. No complicating features are identified. The small bowel and colon are grossly normal. No obstructive findings. Vascular/Lymphatic: Stable atherosclerotic calcifications involving the aorta but no aneurysm or dissection. The branch vessels are patent. The major venous structures are patent. No mesenteric or retroperitoneal mass or adenopathy. Reproductive: The uterus and ovaries are unremarkable. Other: No pelvic mass or free pelvic fluid collections. No inguinal mass or adenopathy. Musculoskeletal: No significant bony findings. Stable degenerative changes involving the spine. IMPRESSION: 1. Stable large complex "mass" in the posterior mediastinum adjacent to the esophagus likely the old hernia sac. 2. Small left pleural effusion and overlying atelectasis. 3. Feeding gastrojejunostomy tube in place without complicating features. 4. No acute abdominal/pelvic findings, mass lesions or adenopathy. Electronically Signed   By: Marijo Sanes M.D.   On: 07/14/2018 20:03   Dg Chest Portable 1 View  Result Date: 07/14/2018 CLINICAL DATA:  Fever. EXAM: PORTABLE CHEST 1 VIEW COMPARISON:  Chest x-rays dated 05/29/2018 and 04/10/2018. FINDINGS: Heart size and mediastinal contours are stable. Lungs are clear. No pleural effusion or pneumothorax seen. No acute or suspicious osseous finding. LEFT shoulder arthroplasty hardware in place. IMPRESSION: No active disease. No evidence of pneumonia or pulmonary edema. Electronically Signed   By: Franki Cabot M.D.   On: 07/14/2018 16:01   ASSESSMENT AND PLAN:   *Suspected septic shock: Present on admission of  Unclear etiology. May be abdominal source. Continue cefepim + flagyl - Continue empiric antibiotics, follow-up CBC and blood culture, no acute pathology seen on abdominal CT. - Continue normal saline, Vasopressor PRN. If Blood Pressure does not improve may need transfer to stepdown  * Persistentdysphagia after esophageal narrowing and dilation: Saw GI while here to see if she needs to have further esophageal dilation and G-tube exchange  * Hyponatremia: continue Normal saline IV and monitor BMP.  * Possible bronchitis.  Robitussin as needed.  *History of hernia repair in February: c/s surgery  * Tobacco abuse.  Smoking cessation was counseled for 3 to 4 minutes.  * Constipation.  Colace twice daily, senna and Dulcolax as needed.     All the records are reviewed and case discussed with Care Management/Social Worker. Management plans discussed with the patient, family and they are in agreement.  CODE STATUS: DNR  TOTAL TIME TAKING CARE OF THIS PATIENT: 35 minutes.   More than 50% of the time was spent in counseling/coordination of care: YES  POSSIBLE D/C IN 2-3 DAYS, DEPENDING ON CLINICAL CONDITION.   Max Sane M.D on 07/15/2018 at 11:22 AM  Between 7am to 6pm - Pager - [phone]  After 6pm go to [email or web address] - Proofreader  [employer]  [facility]ospital[facility]  Office  [phone]  CC: Primary care physician; Guadalupe Maple, MD  Note: This dictation was prepared with  Dragon dictation along with smaller Company secretary. Any transcriptional errors that result from this process are unintentional.

## 2018-07-16 ENCOUNTER — Inpatient Hospital Stay: Payer: Medicare Other | Admitting: Certified Registered Nurse Anesthetist

## 2018-07-16 ENCOUNTER — Encounter: Payer: Self-pay | Admitting: Anesthesiology

## 2018-07-16 ENCOUNTER — Encounter
Admission: EM | Disposition: A | Discharge status: Skilled Nursing Facility | Payer: Self-pay | Source: Home / Self Care | Attending: Internal Medicine

## 2018-07-16 HISTORY — PX: ESOPHAGOGASTRODUODENOSCOPY (EGD) WITH PROPOFOL: SHX5813

## 2018-07-16 LAB — BASIC METABOLIC PANEL
Anion gap: 7 (ref 5–15)
BUN: 6 mg/dL — ABNORMAL LOW (ref 8–23)
CO2: 23 mmol/L (ref 22–32)
Calcium: 7.8 mg/dL — ABNORMAL LOW (ref 8.9–10.3)
Chloride: 104 mmol/L (ref 98–111)
Creatinine, Ser: 0.45 mg/dL (ref 0.44–1.00)
GFR calc Af Amer: >60 mL/min (ref >60)
GFR calc non Af Amer: >60 mL/min (ref >60)
Glucose, Bld: 95 mg/dL (ref 70–99)
Potassium: 3.2 mmol/L — ABNORMAL LOW (ref 3.5–5.1)
Sodium: 134 mmol/L — ABNORMAL LOW (ref 135–145)

## 2018-07-16 LAB — CBC
HCT: 30.7 % — ABNORMAL LOW (ref 36.0–46.0)
Hemoglobin: 9.8 g/dL — ABNORMAL LOW (ref 12.0–15.0)
MCH: 28.5 pg (ref 26.0–34.0)
MCHC: 31.9 g/dL (ref 30.0–36.0)
MCV: 89.2 fL (ref 80.0–100.0)
Platelets: 421 10*3/uL — ABNORMAL HIGH (ref 150–400)
RBC: 3.44 MIL/uL — ABNORMAL LOW (ref 3.87–5.11)
RDW: 13.2 % (ref 11.5–15.5)
WBC: 8.3 10*3/uL (ref 4.0–10.5)
nRBC: 0 % (ref 0.0–0.2)

## 2018-07-16 SURGERY — ESOPHAGOGASTRODUODENOSCOPY (EGD) WITH PROPOFOL
Anesthesia: General

## 2018-07-16 MED ORDER — LIDOCAINE HCL (CARDIAC) PF 100 MG/5ML IV SOSY
PREFILLED_SYRINGE | INTRAVENOUS | Status: DC | PRN
  Administered 2018-07-16: 50 mg via INTRAVENOUS

## 2018-07-16 MED ORDER — PROPOFOL 10 MG/ML IV BOLUS
INTRAVENOUS | Status: DC | PRN
  Administered 2018-07-16: 50 mg via INTRAVENOUS

## 2018-07-16 MED ORDER — PROPOFOL 500 MG/50ML IV EMUL
INTRAVENOUS | Status: DC | PRN
  Administered 2018-07-16: 175 ug/kg/min via INTRAVENOUS

## 2018-07-16 MED ORDER — FLEET ENEMA 7-19 GM/118ML RE ENEM
1.0000 | ENEMA | Freq: Once | RECTAL | Status: AC
  Administered 2018-07-16: 1 via RECTAL

## 2018-07-16 NOTE — Progress Notes (Signed)
Dry dressing applied to peg tube site. Report called in to nurse Danae Chen. Pt to be transported back to unit.

## 2018-07-16 NOTE — Progress Notes (Signed)
Initial Nutrition Assessment  RD working remotely.  DOCUMENTATION CODES:   Severe malnutrition in context of chronic illness  INTERVENTION:  Resume Osmolite 1.5 Cal bolus regimen of 1 can 4 times daily per G-tube. Provides 1420 kcal, 60 grams of protein, 724 mL H2O daily.  Recommend free water flush of 50 mL before and after each tube feeding. Provides a total of 1124 mL H2O daily including water in tube feeding.  Provide liquid MVI daily per tube.  Monitor magnesium, potassium, and phosphorus daily for at least 3 days, MD to replete as needed, as pt is at risk for refeeding syndrome.  NUTRITION DIAGNOSIS:   Severe Malnutrition related to chronic illness(hiatal hernia s/p open repair, nissen fundoplication, and placement of G-J tube, now inadequate enteral nutrition infusion) as evidenced by energy intake < or equal to 75% for > or equal to 1 month, 12.6% weight loss over the past 5 months.  GOAL:   Patient will meet greater than or equal to 90% of their needs  MONITOR:   PO intake, Labs, Weight trends, TF tolerance, Skin, I & O's  REASON FOR ASSESSMENT:   New TF    ASSESSMENT:   68 year old female with PMHx of GERD, HLD, panic attacks, breast cancer s/p lumpectomy, hx Nissen fundoplication with perforation of esophagus and resulting stricture s/p dilation, hx G-J tube placement admitted with suspected septic shock, persistent dysphagia, leaking feeding tube.   -Patient s/p removal of PEG-J tube and replacement with G-tube today by GI.  Spoke with patient over the phone today after she returned from feeding tube replacement. She reports her feeding tube started leaking last week and she and her sister have not been able to use it since then. Patient reports she is unable to provide the bolus feeds herself due to shoulder pain/weakness. Her sister provides her feeds when she is able to, which is usually only 1-2 cans per day (goal is 4 cans per day). This means patient is only  getting at most 700-800 kcal/day (57% minimum estimated needs). She flushes tube with 50 mL before and after each feed. She reports she is only able to eat small amounts of very soft feeds PO such as mashed potatoes, soup, soft/moist meat like tuna salad. She reports she is really only taking bites, though.  She reports ongoing weight loss. Her UBW had been 185 lbs in 2018. She was 171.3 lbs on 01/31/2017 and had lost down to 128.2 lbs on 02/02/2018. That is a weight loss of 43.1 lbs (25% body weight) over one year, which is significant for time frame. Patient has continued to lose weight in setting of inadequate enteral nutrition infusion. She is currently 50.8 kg (111.99 lbs). She has lost 16.21 lbs (12.6% body weight) over the past 5 months, which is significant for time frame.  Medications reviewed and include: Colace 100 mg BID, pantoprazole, Fleet enema, NS at 125 mL/hr, cefepime, Flagyl.  Labs reviewed: Sodium 134, Potassium 3.2, BUN 6.  NUTRITION - FOCUSED PHYSICAL EXAM:  Unable to complete at this time.  Diet Order:   Diet Order            Diet full liquid Room service appropriate? Yes; Fluid consistency: Thin  Diet effective now             EDUCATION NEEDS:   No education needs have been identified at this time  Skin:  Skin Assessment: Reviewed RN Assessment  Last BM:  Unknown/PTA  Height:   Ht Readings  from Last 1 Encounters:  07/16/18 5\' 5"  (1.651 m)   Weight:   Wt Readings from Last 1 Encounters:  07/16/18 50.8 kg   Ideal Body Weight:  56.8 kg  BMI:  Body mass index is 18.64 kg/m.  Estimated Nutritional Needs:   Kcal:  1400-1600  Protein:  70-80 grams  Fluid:  1.4-1.6 L/day  Willey Blade, MS, RD, LDN Office: [phone] Pager: [phone] After Hours/Weekend Pager: [phone]

## 2018-07-16 NOTE — Progress Notes (Signed)
The patient had her PEG tube changed today.  The previously placed PEG tube had a knot in the jejunal tube.  Replacement was uneventful.  The patient has been following up at [facility] for esophageal dilation and should continue to follow-up with them.  I will sign off.  Please call if any further GI concerns or questions.  We would like to thank you for the opportunity to participate in the care of Chelsey [name].

## 2018-07-16 NOTE — Anesthesia Procedure Notes (Signed)
Date/Time: 07/16/2018 1:04 PM Performed by: Alvin Critchley, MD Pre-anesthesia Checklist: Patient identified, Emergency Drugs available, Suction available, Patient being monitored and Timeout performed Patient Re-evaluated:Patient Re-evaluated prior to induction Oxygen Delivery Method: Nasal cannula Preoxygenation: Pre-oxygenation with 100% oxygen Induction Type: IV induction

## 2018-07-16 NOTE — Progress Notes (Signed)
Pharmacy Antibiotic Note  Chelsey [name] is a 68 y.o. female admitted on 07/14/2018 with sepsis.  Patient with uppder endoscopy on 6/21. Pharmacy has been consulted for Vancomycin, Cefepime dosing.  Plan: Cefepime 2g IV Q12hr.   Continue metronidazole 500mg  IV Q8hr.     Source likely intraabdominal, per conversation with attending vancomycin discontinued.   Height: 5\' 5"  (165.1 cm) Weight: 111 lb 15.9 oz (50.8 kg) IBW/kg (Calculated) : 57  Temp (24hrs), Avg:97.4 F (36.3 C), Min:96.7 F (35.9 C), Max:98.2 F (36.8 C)  Recent Labs  Lab 07/14/18 1347 07/14/18 1629 07/15/18 0704 07/16/18 0453  WBC 20.4*  --  10.3 8.3  CREATININE 0.68  --  0.41* 0.45  LATICACIDVEN  --  1.3  --   --     Estimated Creatinine Clearance: 54.7 mL/min (by C-G formula based on SCr of 0.45 mg/dL).    No Known Allergies  Antimicrobials this admission: Vancomycin 6/19 >> 6/20 Metronidazole 6/19 >>  Cefepime 6/19 >>   Dose adjustments this admission: N/A  Microbiology results: 6/19 BCx: no growth x 2 days  6/19 COVID: negative   Thank you for allowing pharmacy to be a part of this patient's care.  Simpson,Michael L 07/16/2018 2:11 PM

## 2018-07-16 NOTE — Progress Notes (Signed)
Patient presented for G-tube exchange.  Noted during Covid-19 screening to have stated she had been running a fever.  Spoke to pt. In person that she would need Covid-19 testing.  Offered the drive-thru option and she could go home and reschedule tube exchange for Monday if negative or the same day Covid testing but would have to wait in waiting room until results obtained.  Pt. Stated she needed to lie down and that she was having a lot of abdominal pain and "couldn't sit up long" . Pt. Was visably ill-weak and having pain.  Counseled that she could go to the ED to be evaluated and tested. Volunteer called patient's sister and they opted to go to the ED.

## 2018-07-16 NOTE — Op Note (Signed)
[facility] Patient Name: Chelsey [name] Procedure Date: 07/16/2018 1:05 PM MRN: [id number] Account #: [id number] Date of Birth: 1950/08/22 Admit Type: Inpatient Age: 68 Room: [facility] ENDO ROOM 4 Gender: Female Note Status: Finalized Procedure:            Upper GI endoscopy Indications:          Replace PEG tube Providers:            Lucilla Lame MD, MD Medicines:            Propofol per Anesthesia Complications:        No immediate complications. Procedure:            Pre-Anesthesia Assessment:                       - Prior to the procedure, a History and Physical was                        performed, and patient medications and allergies were                        reviewed. The patient's tolerance of previous                        anesthesia was also reviewed. The risks and benefits of                        the procedure and the sedation options and risks were                        discussed with the patient. All questions were                        answered, and informed consent was obtained. Prior                        Anticoagulants: The patient has taken no previous                        anticoagulant or antiplatelet agents. ASA Grade                        Assessment: II - A patient with mild systemic disease.                        After reviewing the risks and benefits, the patient was                        deemed in satisfactory condition to undergo the                        procedure.                       After obtaining informed consent, the endoscope was                        passed under direct vision. Throughout the procedure,                        the patient's blood pressure,  pulse, and oxygen                        saturations were monitored continuously. The Endoscope                        was introduced through the mouth, and advanced to the                        duodenal bulb. The upper GI endoscopy was accomplished                         without difficulty. The patient tolerated the procedure                        well. Findings:      One benign-appearing, intrinsic severe stenosis was found in the lower       third of the esophagus. The stenosis was traversed after downsizing       scope.      There was evidence of a gastrostomy present in the gastric body.      The PEG required removal because it was coiled and was leaking. The PEG       was removed percutaneously. Removal was easily accomplished. An       externally removable gastrostomy tube was lubricated. The replacement       tube was placed using the existing gastrostomy port. The final position       of the gastrostomy tube was confirmed by relook endoscopy, and skin       marking noted to be 3 cm at the external bumper. The final tension and       compression of the abdominal wall by the PEG tube and external bumper       were checked and revealed that the bumper was loose and lightly touching       the skin. The tube was capped, and the tube site was cleaned and dressed.      The examined duodenum was normal. Impression:           - Benign-appearing esophageal stenosis.                       - Gastrostomy present.                       - Normal examined duodenum.                       - The PEG-J was removed because it was coiled and was                        leaking, and replaced with an externally removable PEG.                       - No specimens collected. Recommendation:       - Resume previous diet.                       - Continue present medications.                       - Follow up at [facility] for further esophageal dilation. Procedure Code(s):    ---  Professional ---                       [phone], Esophagogastroduodenoscopy, flexible, transoral;                        with directed placement of percutaneous gastrostomy tube Diagnosis Code(s):    --- Professional ---                       P54.65, Gastrostomy malfunction                        Z43.1, Encounter for attention to gastrostomy CPT copyright 2019 [employer] Association. All rights reserved. The codes documented in this report are preliminary and upon coder review may  be revised to meet current compliance requirements. Lucilla Lame MD, MD 07/16/2018 1:22:26 PM This report has been signed electronically. Number of Addenda: 0 Note Initiated On: 07/16/2018 1:05 PM Estimated Blood Loss: Estimated blood loss: none.      [facility]

## 2018-07-16 NOTE — Progress Notes (Signed)
Dry dressing applied to peg tube site.

## 2018-07-16 NOTE — Plan of Care (Signed)
Patient doing ok today.  Still having some abdominal pain.  She went this afternoon and had the PEG tube placed.  Enema given with only little results right now.  No significant changes.  Unable to reach patient's sister today.

## 2018-07-16 NOTE — Progress Notes (Signed)
[place] [place] [facility] [place] NAME: Chelsey [name]    MR#:  [id number]  DATE OF BIRTH:  1950-12-18  SUBJECTIVE:  CHIEF COMPLAINT:   Chief Complaint  Patient presents with  . Fever  . Hypotension  + abdominal pain, BP some better, getting G tube change over today afternoon, reports constipation REVIEW OF SYSTEMS:  Review of Systems  Constitutional: Negative for diaphoresis, fever, malaise/fatigue and weight loss.  HENT: Negative for ear pain, hearing loss, nosebleeds, sore throat and tinnitus.   Eyes: Negative for blurred vision and pain.  Respiratory: Negative for cough, hemoptysis, shortness of breath and wheezing.   Cardiovascular: Negative for chest pain, palpitations, orthopnea and leg swelling.  Gastrointestinal: Positive for abdominal pain and constipation. Negative for blood in stool, diarrhea, heartburn, nausea and vomiting.  Genitourinary: Negative for dysuria, frequency and urgency.  Musculoskeletal: Negative for back pain and myalgias.  Skin: Negative for itching and rash.  Neurological: Negative for dizziness, tingling, tremors, focal weakness, seizures, weakness and headaches.  Psychiatric/Behavioral: Negative for depression. The patient is not nervous/anxious.     DRUG ALLERGIES:  No Known Allergies VITALS:  Blood pressure 105/69, pulse 61, temperature (!) 97.4 F (36.3 C), temperature source Oral, resp. rate 18, height 5\' 5"  (1.651 m), weight 50.8 kg, SpO2 99 %. PHYSICAL EXAMINATION:  Physical Exam HENT:     Head: Normocephalic and atraumatic.  Eyes:     Conjunctiva/sclera: Conjunctivae normal.     Pupils: Pupils are equal, round, and reactive to light.  Neck:     Musculoskeletal: Normal range of motion and neck supple.     Thyroid: No thyromegaly.     Trachea: No tracheal deviation.  Cardiovascular:     Rate and Rhythm: Normal rate and regular rhythm.     Heart sounds: Normal heart sounds.  Pulmonary:     Effort:  Pulmonary effort is normal. No respiratory distress.     Breath sounds: Normal breath sounds. No wheezing.  Chest:     Chest wall: No tenderness.  Abdominal:     General: Bowel sounds are normal. There is no distension.     Palpations: Abdomen is soft.     Tenderness: There is no abdominal tenderness.  Musculoskeletal: Normal range of motion.  Skin:    General: Skin is warm and dry.     Findings: No rash.  Neurological:     Mental Status: She is alert and oriented to person, place, and time.     Cranial Nerves: No cranial nerve deficit.    LABORATORY PANEL:  Female CBC Recent Labs  Lab 07/16/18 0453  WBC 8.3  HGB 9.8*  HCT 30.7*  PLT 421*   ------------------------------------------------------------------------------------------------------------------ Chemistries  Recent Labs  Lab 07/14/18 1347  07/16/18 0453  NA 129*   < > 134*  K 4.4   < > 3.2*  CL 90*   < > 104  CO2 27   < > 23  GLUCOSE 121*   < > 95  BUN 14   < > 6*  CREATININE 0.68   < > 0.45  CALCIUM 8.8*   < > 7.8*  AST 32  --   --   ALT 49*  --   --   ALKPHOS 215*  --   --   BILITOT 1.1  --   --    < > = values in this interval not displayed.   RADIOLOGY:  No results found. ASSESSMENT AND PLAN:   *  Suspected septic shock: Present on admission of Unclear etiology. May be abdominal source. Continue cefepim + flagyl - Continue empiric antibiotics, follow-up CBC and blood culture, no acute pathology seen on abdominal CT. App - BP improved.  * Persistentdysphagia after esophageal narrowing and dilation:  S/p EGD this afternoon showing Benign-appearing esophageal stenosis. - The PEG-J was removed because it was coiled and was leaking, and replaced with an externally removable PEG. - Resume previous diet. - Follow up at [facility] for further esophageal dilation as scheduled  * Hyponatremia: improved with NS.  * Hypokalemia replete and recheck  * Possible bronchitis.  Robitussin as needed.  *History of  hernia repair in February: outpt surgery f/up. No acute issues per surgery eval  * Tobacco abuse.  Smoking cessation was counseled for 3 to 4 minutes.  * Constipation.  Colace twice daily, senna and Dulcolax as needed. Try fleet enema     All the records are reviewed and case discussed with Care Management/Social Worker. Management plans discussed with the patient, Dr Allen Norris and they are in agreement.  CODE STATUS: DNR  TOTAL TIME TAKING CARE OF THIS PATIENT: 35 minutes.   More than 50% of the time was spent in counseling/coordination of care: YES  POSSIBLE D/C IN 1-2 DAYS, DEPENDING ON CLINICAL CONDITION.   Max Sane M.D on 07/16/2018 at 3:03 PM  Between 7am to 6pm - Pager - [phone]  After 6pm go to [email or web address] - Proofreader  [facility]  Office  [phone]  CC: Primary care physician; Guadalupe Maple, MD  Note: This dictation was prepared with Dragon dictation along with smaller phrase technology. Any transcriptional errors that result from this process are unintentional.

## 2018-07-16 NOTE — Anesthesia Preprocedure Evaluation (Signed)
Anesthesia Evaluation  Patient identified by MRN, date of birth, ID band Patient awake    Reviewed: Allergy & Precautions, NPO status , Patient's Chart, lab work & pertinent test results  History of Anesthesia Complications Negative for: history of anesthetic complications  Airway Mallampati: II  TM Distance: >3 FB Neck ROM: Full    Dental  (+) Poor Dentition   Pulmonary neg sleep apnea, neg COPD, Current Smoker,    breath sounds clear to auscultation- rhonchi (-) wheezing      Cardiovascular Exercise Tolerance: Good (-) hypertension(-) CAD, (-) Past MI, (-) Cardiac Stents and (-) CABG negative cardio ROS   Rhythm:Regular Rate:Normal - Systolic murmurs and - Diastolic murmurs    Neuro/Psych neg Seizures PSYCHIATRIC DISORDERS Anxiety Depression negative neurological ROS     GI/Hepatic Neg liver ROS, hiatal hernia, GERD  ,  Endo/Other  negative endocrine ROSneg diabetes  Renal/GU negative Renal ROS     Musculoskeletal negative musculoskeletal ROS (+)   Abdominal (+) - obese,   Peds negative pediatric ROS (+)  Hematology  (+) anemia ,   Anesthesia Other Findings Past Medical History: 1997: Breast cancer ([place])     Comment:  right breast, radiation No date: Bronchitis     Comment:  recent/ 06/09/15 had chest xray/ [facility]               care/resolved 1997: Cancer [facility])     Comment:  right lumpectomy,L/Ad/R  No date: GERD (gastroesophageal reflux disease) No date: Hypercholesteremia No date: Hyperlipidemia No date: Panic attack No date: Personal history of malignant neoplasm of breast   Reproductive/Obstetrics                             Anesthesia Physical  Anesthesia Plan  ASA: II  Anesthesia Plan: General   Post-op Pain Management:    Induction: Intravenous  PONV Risk Score and Plan: 1 and Propofol infusion and TIVA  Airway Management Planned: Nasal  Cannula  Additional Equipment:   Intra-op Plan:   Post-operative Plan:   Informed Consent: I have reviewed the patients History and Physical, chart, labs and discussed the procedure including the risks, benefits and alternatives for the proposed anesthesia with the patient or authorized representative who has indicated his/her understanding and acceptance.     Dental advisory given  Plan Discussed with: CRNA and Anesthesiologist  Anesthesia Plan Comments:         Anesthesia Quick Evaluation

## 2018-07-16 NOTE — Anesthesia Post-op Follow-up Note (Signed)
Anesthesia QCDR form completed.        

## 2018-07-16 NOTE — Anesthesia Postprocedure Evaluation (Signed)
Anesthesia Post Note  Patient: Chelsey [name]  Procedure(s) Performed: ESOPHAGOGASTRODUODENOSCOPY (EGD) WITH PROPOFOL (N/A )  Patient location during evaluation: Endoscopy Anesthesia Type: General Level of consciousness: awake and alert and oriented Pain management: pain level controlled Vital Signs Assessment: post-procedure vital signs reviewed and stable Respiratory status: spontaneous breathing Cardiovascular status: blood pressure returned to baseline Anesthetic complications: no     Last Vitals:  Vitals:   07/16/18 1324 07/16/18 1331  BP: 107/65 112/78  Pulse: 74 78  Resp: 10 16  Temp: 36.8 C   SpO2: 100% 100%    Last Pain:  Vitals:   07/16/18 1331  TempSrc:   PainSc: 0-No pain                 Astraea Gaughran

## 2018-07-16 NOTE — Transfer of Care (Signed)
Immediate Anesthesia Transfer of Care Note  Patient: Chelsey [name]  Procedure(s) Performed: ESOPHAGOGASTRODUODENOSCOPY (EGD) WITH PROPOFOL (N/A )  Patient Location: PACU  Anesthesia Type:General  Level of Consciousness: awake and alert   Airway & Oxygen Therapy: Patient Spontanous Breathing and Patient connected to nasal cannula oxygen  Post-op Assessment: Report given to RN and Post -op Vital signs reviewed and stable  Post vital signs: Reviewed and stable  Last Vitals:  Vitals Value Taken Time  BP 107/65 07/16/18 1324  Temp 36.8 C 07/16/18 1324  Pulse 74 07/16/18 1324  Resp 10 07/16/18 1324  SpO2 100 % 07/16/18 1324    Last Pain:  Vitals:   07/16/18 1324  TempSrc: Temporal  PainSc:          Complications: No apparent anesthesia complications

## 2018-07-17 ENCOUNTER — Encounter: Payer: Self-pay | Admitting: Gastroenterology

## 2018-07-17 ENCOUNTER — Ambulatory Visit (INDEPENDENT_AMBULATORY_CARE_PROVIDER_SITE_OTHER): Payer: Federal, State, Local not specified - PPO | Admitting: Psychiatry

## 2018-07-17 DIAGNOSIS — F331 Major depressive disorder, recurrent, moderate: Secondary | ICD-10-CM

## 2018-07-17 LAB — BASIC METABOLIC PANEL
Anion gap: 7 (ref 5–15)
BUN: 7 mg/dL — ABNORMAL LOW (ref 8–23)
CO2: 23 mmol/L (ref 22–32)
Calcium: 7.9 mg/dL — ABNORMAL LOW (ref 8.9–10.3)
Chloride: 106 mmol/L (ref 98–111)
Creatinine, Ser: 0.48 mg/dL (ref 0.44–1.00)
GFR calc Af Amer: >60 mL/min (ref >60)
GFR calc non Af Amer: >60 mL/min (ref >60)
Glucose, Bld: 92 mg/dL (ref 70–99)
Potassium: 3.4 mmol/L — ABNORMAL LOW (ref 3.5–5.1)
Sodium: 136 mmol/L (ref 135–145)

## 2018-07-17 LAB — CBC
HCT: 34.7 % — ABNORMAL LOW (ref 36.0–46.0)
Hemoglobin: 11.2 g/dL — ABNORMAL LOW (ref 12.0–15.0)
MCH: 28.5 pg (ref 26.0–34.0)
MCHC: 32.3 g/dL (ref 30.0–36.0)
MCV: 88.3 fL (ref 80.0–100.0)
Platelets: 496 10*3/uL — ABNORMAL HIGH (ref 150–400)
RBC: 3.93 MIL/uL (ref 3.87–5.11)
RDW: 13.1 % (ref 11.5–15.5)
WBC: 7.9 10*3/uL (ref 4.0–10.5)
nRBC: 0 % (ref 0.0–0.2)

## 2018-07-17 LAB — PHOSPHORUS: Phosphorus: 3.1 mg/dL (ref 2.5–4.6)

## 2018-07-17 LAB — MAGNESIUM: Magnesium: 2.1 mg/dL (ref 1.7–2.4)

## 2018-07-17 MED ORDER — BISACODYL 5 MG PO TBEC
5.0000 mg | DELAYED_RELEASE_TABLET | Freq: Every day | ORAL | Status: DC
  Administered 2018-07-17 – 2018-07-19 (×3): 5 mg via ORAL
  Filled 2018-07-17 (×3): qty 1

## 2018-07-17 MED ORDER — FLEET ENEMA 7-19 GM/118ML RE ENEM
1.0000 | ENEMA | RECTAL | Status: AC
  Administered 2018-07-17: 1 via RECTAL

## 2018-07-17 MED ORDER — SENNOSIDES-DOCUSATE SODIUM 8.6-50 MG PO TABS
2.0000 | ORAL_TABLET | Freq: Two times a day (BID) | ORAL | Status: DC
  Administered 2018-07-17 – 2018-07-19 (×5): 2 via ORAL
  Filled 2018-07-17 (×5): qty 2

## 2018-07-17 NOTE — Progress Notes (Signed)
Pt is currently hospitalized in [facility] s/p ESOPHAGOGASTRODUODENOSCOPY (EGD) WITH PROPOFOL.  Will reschedule her appt.

## 2018-07-17 NOTE — Progress Notes (Signed)
Nutrition Brief Note  Discussed with Colletta Maryland, RNCM. Patient actually is able to provide her own bolus feeds and had to demonstrate that prior to being able to discharge last admission. Will continue with current bolus regimen. Encouraged patient to provide all four bottles per day.  Nutrition Intervention: Continue Osmolite 1.5 Cal bolus regimen of 1 can (237 mL) 4 times daily per G-tube. Provides 1420 kcal, 60 grams of protein, 724 mL H2O daily.  Once patient is off IV fluids, recommend resuming free water flush of 50 mL Q4hrs before and after each bolus tube feeding. Provides a total of 1124 mL H2O daily including water in tube feeding.  Continue liquid MVI daily per tube.   Willey Blade, MS, [place], LDN Office: [phone] Pager: [phone] After Hours/Weekend Pager: [phone]

## 2018-07-17 NOTE — Evaluation (Signed)
Physical Therapy Evaluation Patient Details Name: Chelsey [name] MRN: [id number] DOB: [birth year] Today's Date: 07/17/2018   History of Present Illness  Pt admitted for sepsis with complaints of fever and chills. Pt initially coming to hospital for G tube exchange, however due to sickness, came to ED. Pt now with PEG tube exchange 07/16/18. History of breast cancer, bronchitis, and GERD.  Clinical Impression  Pt is a pleasant 68 year old female who was admitted for sepsis. Pt performs bed mobility, transfers, and ambulation with min assist. Pt demonstrates deficits with balance/strength/mobility. Pt very unsteady and fatigues quickly with limited exertion. Recommend further ambulation using RW for improved balance. Pt is currently not at baseline level. Would benefit from skilled PT to address above deficits and promote optimal return to PLOF; recommend transition to STR upon discharge from acute hospitalization.     Follow Up Recommendations SNF    Equipment Recommendations  Rolling walker with 5" wheels    Recommendations for Other Services       Precautions / Restrictions Precautions Precautions: Fall Restrictions Weight Bearing Restrictions: No      Mobility  Bed Mobility Overal bed mobility: Needs Assistance Bed Mobility: Supine to Sit     Supine to sit: Min assist     General bed mobility comments: reports increased pain with mobility and reports being most comfortable on R side. Once seated, upright posture noted  Transfers Overall transfer level: Needs assistance Equipment used: None Transfers: Sit to/from Stand Sit to Stand: Min assist         General transfer comment: unsteady during standing with AD. 2nd attempt pt used B UE on IV pole with improved balance  Ambulation/Gait Ambulation/Gait assistance: Min assist Gait Distance (Feet): 3 Feet Assistive device: IV Pole Gait Pattern/deviations: Step-to pattern     General Gait Details: unsteadiness with  short distance. Increased SOB symptoms, needed to sit back down on bed. Pt refused sitting in recliner due to sacral pain.   Stairs            Wheelchair Mobility    Modified Rankin (Stroke Patients Only)       Balance Overall balance assessment: Needs assistance Sitting-balance support: Feet supported;Single extremity supported Sitting balance-Leahy Scale: Good     Standing balance support: Bilateral upper extremity supported Standing balance-Leahy Scale: Fair Standing balance comment: used IV pole                             Pertinent Vitals/Pain Pain Assessment: Faces Faces Pain Scale: Hurts even more Pain Location: sacrum and L shoulder Pain Descriptors / Indicators: Aching;Discomfort;Dull Pain Intervention(s): Limited activity within patient's tolerance;Repositioned    Home Living Family/patient expects to be discharged to:: Private residence Living Arrangements: Children Available Help at Discharge: Family;Available PRN/intermittently Type of Home: House Home Access: Stairs to enter Entrance Stairs-Rails: None Entrance Stairs-Number of Steps: 2 Home Layout: One level Home Equipment: Bedside commode;Cane - single point;Walker - 2 wheels      Prior Function Level of Independence: Independent         Comments: reports no falls, but several LOB noted at home. Reports she is household ambulator without AD     Hand Dominance   Dominant Hand: Left    Extremity/Trunk Assessment   Upper Extremity Assessment Upper Extremity Assessment: Generalized weakness(B UE grossly 3+/5)    Lower Extremity Assessment Lower Extremity Assessment: Generalized weakness(B LE grossly 3+/5)       Communication  Communication: No difficulties  Cognition Arousal/Alertness: Awake/alert Behavior During Therapy: WFL for tasks assessed/performed Overall Cognitive Status: Within Functional Limits for tasks assessed                                         General Comments      Exercises Other Exercises Other Exercises: supine/seated ther-ex performed on B LE including LAQ, alt. marching, SLR, and hip abd/add. All ther-ex performed x 10 reps with cga and multiple rest breaks due to SOB symptoms.   Assessment/Plan    PT Assessment Patient needs continued PT services  PT Problem List Decreased strength;Decreased activity tolerance;Decreased balance;Decreased mobility;Cardiopulmonary status limiting activity;Pain       PT Treatment Interventions DME instruction;Gait training;Therapeutic exercise;Balance training    PT Goals (Current goals can be found in the Care Plan section)  Acute Rehab PT Goals Patient Stated Goal: to go to rehab PT Goal Formulation: With patient Time For Goal Achievement: 07/31/18 Potential to Achieve Goals: Good    Frequency Min 2X/week   Barriers to discharge        Co-evaluation               AM-PAC PT "6 Clicks" Mobility  Outcome Measure Help needed turning from your back to your side while in a flat bed without using bedrails?: None Help needed moving from lying on your back to sitting on the side of a flat bed without using bedrails?: A Little Help needed moving to and from a bed to a chair (including a wheelchair)?: A Little Help needed standing up from a chair using your arms (e.g., wheelchair or bedside chair)?: A Little Help needed to walk in hospital room?: A Lot Help needed climbing 3-5 steps with a railing? : A Lot 6 Click Score: 17    End of Session   Activity Tolerance: Patient limited by fatigue Patient left: in bed;with bed alarm set Nurse Communication: Mobility status PT Visit Diagnosis: Muscle weakness (generalized) (M62.81);Unsteadiness on feet (R26.81);Difficulty in walking, not elsewhere classified (R26.2);Pain Pain - Right/Left: (sacrum) Pain - part of body: (sacrum)    Time: 4259-5638 PT Time Calculation (min) (ACUTE ONLY): 23 min   Charges:   PT  Evaluation $PT Eval Low Complexity: 1 Low PT Treatments $Therapeutic Exercise: 8-22 mins        Greggory Stallion, PT, DPT [phone]   Aastha Dayley 07/17/2018, 3:40 PM

## 2018-07-17 NOTE — Progress Notes (Signed)
[facility] [place] NAME: Chelsey [name]    MR#:  [id number]  DATE OF BIRTH:  1950-11-25  SUBJECTIVE:  CHIEF COMPLAINT:   Chief Complaint  Patient presents with  . Fever  . Hypotension  + abdominal pain, reports constipation REVIEW OF SYSTEMS:  Review of Systems  Constitutional: Negative for diaphoresis, fever, malaise/fatigue and weight loss.  HENT: Negative for ear pain, hearing loss, nosebleeds, sore throat and tinnitus.   Eyes: Negative for blurred vision and pain.  Respiratory: Negative for cough, hemoptysis, shortness of breath and wheezing.   Cardiovascular: Negative for chest pain, palpitations, orthopnea and leg swelling.  Gastrointestinal: Positive for abdominal pain and constipation. Negative for blood in stool, diarrhea, heartburn, nausea and vomiting.  Genitourinary: Negative for dysuria, frequency and urgency.  Musculoskeletal: Negative for back pain and myalgias.  Skin: Negative for itching and rash.  Neurological: Negative for dizziness, tingling, tremors, focal weakness, seizures, weakness and headaches.  Psychiatric/Behavioral: Negative for depression. The patient is not nervous/anxious.    DRUG ALLERGIES:  No Known Allergies VITALS:  Blood pressure 104/70, pulse 68, temperature 97.7 F (36.5 C), temperature source Oral, resp. rate 18, height 5\' 5"  (1.651 m), weight 50.8 kg, SpO2 100 %. PHYSICAL EXAMINATION:  Physical Exam HENT:     Head: Normocephalic and atraumatic.  Eyes:     Conjunctiva/sclera: Conjunctivae normal.     Pupils: Pupils are equal, round, and reactive to light.  Neck:     Musculoskeletal: Normal range of motion and neck supple.     Thyroid: No thyromegaly.     Trachea: No tracheal deviation.  Cardiovascular:     Rate and Rhythm: Normal rate and regular rhythm.     Heart sounds: Normal heart sounds.  Pulmonary:     Effort: Pulmonary effort is normal. No respiratory distress.     Breath  sounds: Normal breath sounds. No wheezing.  Chest:     Chest wall: No tenderness.  Abdominal:     General: Bowel sounds are normal. There is no distension.     Palpations: Abdomen is soft.     Tenderness: There is no abdominal tenderness.  Musculoskeletal: Normal range of motion.  Skin:    General: Skin is warm and dry.     Findings: No rash.  Neurological:     Mental Status: She is alert and oriented to person, place, and time.     Cranial Nerves: No cranial nerve deficit.    LABORATORY PANEL:  Female CBC Recent Labs  Lab 07/17/18 0648  WBC 7.9  HGB 11.2*  HCT 34.7*  PLT 496*   ------------------------------------------------------------------------------------------------------------------ Chemistries  Recent Labs  Lab 07/14/18 1347  07/17/18 0648  NA 129*   < > 136  K 4.4   < > 3.4*  CL 90*   < > 106  CO2 27   < > 23  GLUCOSE 121*   < > 92  BUN 14   < > 7*  CREATININE 0.68   < > 0.48  CALCIUM 8.8*   < > 7.9*  MG  --   --  2.1  AST 32  --   --   ALT 49*  --   --   ALKPHOS 215*  --   --   BILITOT 1.1  --   --    < > = values in this interval not displayed.   RADIOLOGY:  No results found. ASSESSMENT AND PLAN:   *Suspected septic shock:  Present on admission of Unclear etiology. May be abdominal source. Continue cefepim + flagyl - Continue empiric antibiotics, follow-up CBC and blood culture, no acute pathology seen on abdominal CT.  - BP improved.  * Persistentdysphagia after esophageal narrowing and dilation:  S/p EGD on 6/21 showing Benign-appearing esophageal stenosis. -PEG tube changed y'day.  The previously placed PEG tube had a knot in the jejunal tube.  Replacement was uneventful.  The patient has been following up at [facility] for esophageal dilation and should continue to follow-up with them per GI  * Hyponatremia: improved with NS.  * Hypokalemia replete and recheck  * Possible bronchitis.  Robitussin as needed.  *History of hernia repair in  February: outpt surgery f/up. No acute issues per surgery eval  * Tobacco abuse.  Smoking cessation was counseled for 3 to 4 minutes.  * Constipation.  Colace twice daily, senna and Dulcolax as needed. Try fleet enema today if still no relief.  She does report having small bowel movement yesterday    Would like to go to nursing home if possible.  We will get physical therapy consultation today   All the records are reviewed and case discussed with Care Management/Social Worker. Management plans discussed with the patient, Dr Allen Norris and they are in agreement.  CODE STATUS: DNR  TOTAL TIME TAKING CARE OF THIS PATIENT: 35 minutes.   More than 50% of the time was spent in counseling/coordination of care: YES  POSSIBLE D/C IN 1-2 DAYS, DEPENDING ON CLINICAL CONDITION.   Max Sane M.D on 07/17/2018 at 11:29 AM  Between 7am to 6pm - Pager - [phone]  After 6pm go to [email or web address] - Proofreader  [facility]  Office  [phone]  CC: Primary care physician; Guadalupe Maple, MD  Note: This dictation was prepared with Dragon dictation along with smaller phrase technology. Any transcriptional errors that result from this process are unintentional.

## 2018-07-17 NOTE — Progress Notes (Signed)
[facility] SURGICAL PROGRESS NOTE (cpt [phone] )  Hospital Day(s): 3.   Post op day(s): 1 Day Post-Op.   Interval History: Patient seen and examined, no acute events or new complaints overnight. Patient reports unchanged abdominal pain and intermittent nausea, which has been present for many weeks to months now. Again, Dr Dahlia Byes has followed this and worked it up extensively outpatient. Work up, which included abdominal CT, was unremarkable aside from jejunostomy tube findings. PEG placed by GI yesterday and working well. Labs improved since admission and no longer with leukocytosis. No other additional complaints.    Review of Systems:  Constitutional: denies fever, chills  HEENT: denies cough or congestion  Respiratory: denies any shortness of breath  Cardiovascular: denies chest pain or palpitations  Gastrointestinal: + abdominal pain, + Nausea, denied Vomiting, or diarrhea/and bowel function as per interval history Genitourinary: denies burning with urination or urinary frequency   Vital signs in last 24 hours: [min-max] current  Temp:  [96.7 F (35.9 C)-98.2 F (36.8 C)] 97.7 F (36.5 C) (06/22 0539) Pulse Rate:  [61-80] 68 (06/22 0539) Resp:  [10-18] 18 (06/22 0539) BP: (94-112)/(63-78) 104/70 (06/22 0539) SpO2:  [99 %-100 %] 100 % (06/22 0539) Weight:  [50.8 kg] 50.8 kg (06/21 1242)     Height: 5\' 5"  (165.1 cm) Weight: 50.8 kg BMI (Calculated): 18.64   Intake/Output last 2 shifts:  06/21 0701 - 06/22 0700 In: 1746.4 [I.V.:932.8; NG/GT:237; IV Piggyback:576.6] Out: 1100 [Urine:1100]   Physical Exam:  Constitutional: alert, cooperative and no distress,  HENT: normocephalic without obvious abnormality  Eyes: PERRL, EOM's grossly intact and symmetric  Respiratory: breathing non-labored at rest  Cardiovascular: regular rate and sinus rhythm  Gastrointestinal: soft, non-tender, and non-distended. No rebound/guarding. PEG in LUQ.  Integumentary: Previous  laparotomy incision is healed well.    Labs:  CBC Latest Ref Rng & Units 07/17/2018 07/16/2018 07/15/2018  WBC 4.0 - 10.5 K/uL 7.9 8.3 10.3  Hemoglobin 12.0 - 15.0 g/dL 11.2(L) 9.8(L) 9.8(L)  Hematocrit 36.0 - 46.0 % 34.7(L) 30.7(L) 31.1(L)  Platelets 150 - 400 K/uL 496(H) 421(H) 369   CMP Latest Ref Rng & Units 07/17/2018 07/16/2018 07/15/2018  Glucose 70 - 99 mg/dL 92 95 91  BUN 8 - 23 mg/dL 7(L) 6(L) 10  Creatinine 0.44 - 1.00 mg/dL 0.48 0.45 0.41(L)  Sodium 135 - 145 mmol/L 136 134(L) 136  Potassium 3.5 - 5.1 mmol/L 3.4(L) 3.2(L) 3.8  Chloride 98 - 111 mmol/L 106 104 106  CO2 22 - 32 mmol/L 23 23 22   Calcium 8.9 - 10.3 mg/dL 7.9(L) 7.8(L) 7.9(L)  Total Protein 6.5 - 8.1 g/dL - - -  Total Bilirubin 0.3 - 1.2 mg/dL - - -  Alkaline Phos 38 - 126 U/L - - -  AST 15 - 41 U/L - - -  ALT 0 - 44 U/L - - -     Imaging studies: No new pertinent imaging studies   Assessment/Plan: (ICD-10's: R10.84) 68 y.o. female who was admitted with suspected sepsis of unknown etiology, which has clinically improved, and chronic abdominal pain and nausea who has been following with Dr Dahlia Byes outpatient following nissen fundoplication and esophageal perforation repair.    - Continue diet as tolerates + supplements through PEG   - pain control prn; antiemetics prn  - monitor abdominal examination    - No acute surgical needs identified; continue to follow up outpatient as scheduled with Dr Minta Balsam for EGD    - further management per primary team;  will remain aware of patient while hospitalized   All of the above findings and recommendations were discussed with the patient, and the medical team, and all of patient's questions were answered to her expressed satisfaction.  -- Edison Simon, PA-C [facility] 07/17/2018, 10:08 AM [phone] M-F: 7am - 4pm

## 2018-07-18 ENCOUNTER — Encounter: Payer: Self-pay | Admitting: Primary Care

## 2018-07-18 DIAGNOSIS — Z7189 Other specified counseling: Secondary | ICD-10-CM

## 2018-07-18 DIAGNOSIS — Z515 Encounter for palliative care: Secondary | ICD-10-CM

## 2018-07-18 DIAGNOSIS — R1084 Generalized abdominal pain: Secondary | ICD-10-CM

## 2018-07-18 LAB — CBC
HCT: 34 % — ABNORMAL LOW (ref 36.0–46.0)
Hemoglobin: 10.9 g/dL — ABNORMAL LOW (ref 12.0–15.0)
MCH: 27.7 pg (ref 26.0–34.0)
MCHC: 32.1 g/dL (ref 30.0–36.0)
MCV: 86.5 fL (ref 80.0–100.0)
Platelets: 504 10*3/uL — ABNORMAL HIGH (ref 150–400)
RBC: 3.93 MIL/uL (ref 3.87–5.11)
RDW: 13 % (ref 11.5–15.5)
WBC: 8.4 10*3/uL (ref 4.0–10.5)
nRBC: 0 % (ref 0.0–0.2)

## 2018-07-18 LAB — COMPREHENSIVE METABOLIC PANEL
ALT: 20 U/L (ref 0–44)
AST: 19 U/L (ref 15–41)
Albumin: 2.2 g/dL — ABNORMAL LOW (ref 3.5–5.0)
Alkaline Phosphatase: 107 U/L (ref 38–126)
Anion gap: 8 (ref 5–15)
BUN: 6 mg/dL — ABNORMAL LOW (ref 8–23)
CO2: 26 mmol/L (ref 22–32)
Calcium: 7.7 mg/dL — ABNORMAL LOW (ref 8.9–10.3)
Chloride: 99 mmol/L (ref 98–111)
Creatinine, Ser: 0.42 mg/dL — ABNORMAL LOW (ref 0.44–1.00)
GFR calc Af Amer: >60 mL/min (ref >60)
GFR calc non Af Amer: >60 mL/min (ref >60)
Glucose, Bld: 107 mg/dL — ABNORMAL HIGH (ref 70–99)
Potassium: 3.4 mmol/L — ABNORMAL LOW (ref 3.5–5.1)
Sodium: 133 mmol/L — ABNORMAL LOW (ref 135–145)
Total Bilirubin: 0.5 mg/dL (ref 0.3–1.2)
Total Protein: 5.3 g/dL — ABNORMAL LOW (ref 6.5–8.1)

## 2018-07-18 LAB — SARS CORONAVIRUS 2 BY RT PCR (HOSPITAL ORDER, PERFORMED IN ~~LOC~~ HOSPITAL LAB): SARS Coronavirus 2: NEGATIVE

## 2018-07-18 MED ORDER — METRONIDAZOLE 500 MG PO TABS
500.0000 mg | ORAL_TABLET | Freq: Three times a day (TID) | ORAL | Status: DC
  Administered 2018-07-18 – 2018-07-19 (×3): 500 mg via ORAL
  Filled 2018-07-18 (×4): qty 1

## 2018-07-18 MED ORDER — POLYETHYLENE GLYCOL 3350 17 G PO PACK
17.0000 g | PACK | Freq: Every day | ORAL | Status: DC
  Administered 2018-07-18 – 2018-07-19 (×2): 17 g via ORAL
  Filled 2018-07-18 (×3): qty 1

## 2018-07-18 MED ORDER — BISACODYL 10 MG RE SUPP
10.0000 mg | Freq: Every day | RECTAL | Status: DC | PRN
  Administered 2018-07-18: 10 mg via RECTAL
  Filled 2018-07-18: qty 1

## 2018-07-18 MED ORDER — OXYCODONE HCL 5 MG PO TABS
5.0000 mg | ORAL_TABLET | ORAL | Status: DC | PRN
  Administered 2018-07-18 – 2018-07-19 (×3): 5 mg via ORAL
  Filled 2018-07-18 (×3): qty 1

## 2018-07-18 MED ORDER — OXYCODONE HCL ER 10 MG PO T12A
10.0000 mg | EXTENDED_RELEASE_TABLET | Freq: Two times a day (BID) | ORAL | Status: DC
  Administered 2018-07-19: 10:00:00 10 mg via ORAL
  Filled 2018-07-18: qty 1

## 2018-07-18 MED ORDER — ONDANSETRON HCL 4 MG PO TABS
4.0000 mg | ORAL_TABLET | Freq: Three times a day (TID) | ORAL | Status: DC
  Filled 2018-07-18: qty 1

## 2018-07-18 NOTE — Progress Notes (Signed)
PHARMACIST - PHYSICIAN COMMUNICATION DR:   Manuella Ghazi CONCERNING: Antibiotic IV to Oral Route Change Policy  RECOMMENDATION: This patient is receiving metronidazole by the intravenous route.  Based on criteria approved by the Pharmacy and Therapeutics Committee, the antibiotic(s) is/are being converted to the equivalent oral dose form(s).   DESCRIPTION: These criteria include:  Patient being treated for a respiratory tract infection, urinary tract infection, cellulitis or clostridium difficile associated diarrhea if on metronidazole  The patient is not neutropenic and does not exhibit a GI malabsorption state  The patient is eating (either orally or via tube) and/or has been taking other orally administered medications for a least 24 hours  The patient is improving clinically and has a Tmax < 100.5  If you have questions about this conversion, please contact the [place], PharmD, BCPS Clinical Pharmacist 07/18/2018 12:35 PM

## 2018-07-18 NOTE — Consult Note (Signed)
Consultation Note Date: 07/18/2018   Patient Name: Chelsey [name]  DOB: 1950/08/06  MRN: [id number]  Age / Sex: 68 y.o., female  PCP: Guadalupe Maple, MD Referring Physician: Max Sane, MD  Reason for Consultation: Establishing goals of care and Pain control  HPI/Patient Profile: 68 y.o. female  with past medical history of unchanged abdominal pain with intermittent nausea present for many weeks to months with extensive outpatient work-up, PEG tube placed at [facility] after esophageal dilation, history of right breast cancer EGD on 6/21 showing benign appearing esophageal stenosis PEG tube previously placed had a knot in the jejunal tube replacement 6/22 uneventful admitted on 07/14/2018 with sepsis unclear source, may be abdominal, hypotension, PEG tube replacement.   Clinical Assessment and Goals of Care: Chelsey [name] is resting quietly in bed.  She greets me, making and keeping eye contact.  She appears frail and thin.  There is no family at bedside at this time due to visitor restrictions.   Chelsey [name] asks about and we talk about the difference between palliative and hospice care. She is open to palliative care. We talk ain detail about symptom management and she is agreeable to changes in regimen, orders made.  Chelsey [name] tells me that she has been suffering greatly over the last few months, has lost much weight and feels hopeless.  We talk about giving a few days for sypmtom management and time for rehab.   Discussion with attending and nursing staff related to patient condition, needs, change in symptom management.    We talk about HCPOA, see below.  We do not talk about code status today.   PMT to evaluate regimen 6/24.     HCPOA    NEXT OF KIN - Chelsey [name] tells me that she is unsure if she would want her sister Florestine Avers or her [place] and Harrell Gave to be her Warehouse manager.  I  encourage her to consider her choices, but most importantly share discussion with family.     SUMMARY OF RECOMMENDATIONS   Symptom management addressed.  Goals is rehab then home.   Code Status/Advance Care Planning:  DNR  Symptom Management:   Oxycodone ER 10 mg PO BID   Oxycodone IR 5 mg Q 4 hours PRN, breakthrough pain  Zofran 4 mg PO Q 8 hours scheduled  Bowel regimen adjusted.   Palliative Prophylaxis:   Frequent Pain Assessment  Additional Recommendations (Limitations, Scope, Preferences):  Treat the treatable but no extraordinary measures  Psycho-social/Spiritual:   Desire for further Chaplaincy support:no  Additional Recommendations: Caregiving  Support/Resources and Education on Hospice  Prognosis:   Unable to determine, based on outcomes  Discharge Planning: requesting rehab      Primary Diagnoses: Present on Admission: . Sepsis ([place])   I have reviewed the medical record, interviewed the patient and family, and examined the patient. The following aspects are pertinent.  Past Medical History:  Diagnosis Date  . Breast cancer ([place]) 1997   right breast, radiation  . Bronchitis  recent/ 06/09/15 had chest xray/ [facility]/resolved  . Cancer [facility]) 1997   right lumpectomy,L/Ad/R   . GERD (gastroesophageal reflux disease)   . History of hiatal hernia   . Hypercholesteremia   . Hyperlipidemia   . Low BP    TYPICALLY RUNS 80'S/60'S  . Panic attack   . Personal history of malignant neoplasm of breast    Social History   Socioeconomic History  . Marital status: Widowed    Spouse name: Not on file  . Number of children: 2  . Years of education: Not on file  . Highest education level: Bachelor's degree (e.g., BA, AB, BS)  Occupational History  . Not on file  Social Needs  . Financial resource strain: Hard  . Food insecurity    Worry: Never true    Inability: Never true  . Transportation needs    Medical: No    Non-medical: No   Tobacco Use  . Smoking status: Current Some Day Smoker    Packs/day: 0.10    Years: 10.00    Pack years: 1.00    Types: Cigarettes    Start date: 11/25/2009  . Smokeless tobacco: Never Used  . Tobacco comment: 1 cigarette daily  Substance and Sexual Activity  . Alcohol use: No    Alcohol/week: 0.0 standard drinks  . Drug use: No  . Sexual activity: Never  Lifestyle  . Physical activity    Days per week: 0 days    Minutes per session: 0 min  . Stress: Very much  Relationships  . Social Herbalist on phone: Not on file    Gets together: Not on file    Attends religious service: Never    Active member of club or organization: No    Attends meetings of clubs or organizations: Never    Relationship status: Widowed  Other Topics Concern  . Not on file  Social History Narrative  . Not on file   Family History  Problem Relation Age of Onset  . Anxiety disorder Brother   . Obesity Brother   . COPD Brother   . Kidney disease Mother   . Heart attack Father   . Hypertension Father   . Alcohol abuse Father   . Depression Brother   . Anxiety disorder Brother   . Breast cancer Maternal Grandmother    Scheduled Meds: . bisacodyl  5 mg Oral Daily  . busPIRone  10 mg Oral BID  . enoxaparin (LOVENOX) injection  40 mg Subcutaneous Q24H  . feeding supplement (OSMOLITE 1.5 CAL)  237 mL Per Tube QID  . metroNIDAZOLE  500 mg Oral Q8H  . pantoprazole  40 mg Oral Daily  . PARoxetine  20 mg Oral QAC supper  . polyethylene glycol  17 g Oral Daily  . senna-docusate  2 tablet Oral BID  . simvastatin  20 mg Oral QHS  . traZODone  100 mg Oral QHS   Continuous Infusions: . sodium chloride 125 mL/hr at 07/18/18 0951  . ceFEPime (MAXIPIME) IV 2 g (07/18/18 0702)   PRN Meds:.acetaminophen **OR** acetaminophen, albuterol, HYDROcodone-acetaminophen, ondansetron **OR** ondansetron (ZOFRAN) IV Medications Prior to Admission:  Prior to Admission medications   Medication Sig Start  Date End Date Taking? Authorizing Provider  busPIRone (BUSPAR) 10 MG tablet Take 1 tablet (10 mg total) by mouth 2 (two) times daily. 05/29/18  Yes Rainey Pines, MD  Calcium Carbonate-Vitamin D (CALCIUM + D PO) Take 1 tablet daily by mouth.  Yes [provider]  Cholecalciferol (VITAMIN D) 2000 units tablet Take 2,000 Units by mouth daily.   Yes [provider]  lidocaine (XYLOCAINE) 5 % ointment Apply 1 application topically 3 (three) times daily as needed. 04/18/18  Yes Piscoya, Jacqulyn Bath, MD  Multiple Vitamin (MULTIVITAMIN) tablet Take 1 tablet daily by mouth.    Yes [provider]  Nutritional Supplements (FEEDING SUPPLEMENT, OSMOLITE 1.5 CAL,) LIQD Place 237 mLs into feeding tube 4 (four) times daily. 04/04/18  Yes Tylene Fantasia, PA-C  omeprazole (PRILOSEC) 20 MG capsule Take 1 capsule (20 mg total) by mouth 2 (two) times daily before a meal. 07/27/17  Yes Kathrine Haddock, NP  oxyCODONE-acetaminophen (PERCOCET) 7.5-325 MG tablet Take 1 tablet by mouth every 4 (four) hours as needed for severe pain. Patient taking differently: Take 1 tablet by mouth daily.  07/10/18  Yes Pabon, Diego F, MD  PARoxetine (PAXIL) 20 MG tablet Take 1 tablet (20 mg total) by mouth daily before supper. 05/29/18  Yes Rainey Pines, MD  Simethicone (GAS-X PO) Take 1-2 tablets by mouth daily as needed (gas).   Yes [provider]  simvastatin (ZOCOR) 20 MG tablet TAKE ONE TABLET BY MOUTH AT BEDTIME 12/16/17  Yes Kathrine Haddock, NP  traZODone (DESYREL) 100 MG tablet Take 1 tablet (100 mg total) by mouth at bedtime. 05/29/18  Yes Rainey Pines, MD  ibuprofen (ADVIL,MOTRIN) 200 MG tablet Take 200-400 mg by mouth daily as needed for headache or moderate pain.    [provider]   No Known Allergies Review of Systems  Unable to perform ROS: Other    Physical Exam Vitals signs and nursing note reviewed.  Constitutional:      General: She is not in acute distress.    Appearance: She is  ill-appearing.     Comments: Makes and mostly keeps eye contact  HENT:     Head: Atraumatic.  Cardiovascular:     Rate and Rhythm: Normal rate.  Pulmonary:     Effort: Pulmonary effort is normal. No respiratory distress.  Abdominal:     General: Abdomen is flat. There is no distension.  Musculoskeletal:        General: No swelling.     Comments: Muscle wasting   Skin:    General: Skin is warm and dry.  Neurological:     Mental Status: She is alert and oriented to person, place, and time.  Psychiatric:     Comments: Calm and cooperative      Vital Signs: BP 94/64 (BP Location: Left Arm)   Pulse 70   Temp 98.1 F (36.7 C) (Oral)   Resp 17   Ht 5\' 5"  (1.651 m)   Wt 50.8 kg   LMP  (LMP Unknown)   SpO2 98%   BMI 18.64 kg/m  Pain Scale: 0-10 POSS *See Group Information*: 1-Acceptable,Awake and alert Pain Score: 0-No pain   SpO2: SpO2: 98 % O2 Device:SpO2: 98 % O2 Flow Rate: .O2 Flow Rate (L/min): 5 L/min  IO: Intake/output summary:   Intake/Output Summary (Last 24 hours) at 07/18/2018 1511 Last data filed at 07/18/2018 1020 Gross per 24 hour  Intake 480 ml  Output 300 ml  Net 180 ml    LBM: Last BM Date: 07/16/18 Baseline Weight: Weight: 50.8 kg Most recent weight: Weight: 50.8 kg     Palliative Assessment/Data:   Flowsheet Rows     Most Recent Value  Intake Tab  Referral Department  Hospitalist  Unit at Time of  Referral  Med/Surg Unit  Palliative Care Primary Diagnosis  Other (Comment)  Date Notified  07/18/18  Palliative Care Type  New Palliative care  Reason for referral  Clarify Goals of Care, Pain  Date of Admission  07/14/18  Date first seen by Palliative Care  07/18/18  # of days Palliative referral response time  0 Day(s)  # of days IP prior to Palliative referral  4  Clinical Assessment  Palliative Performance Scale Score  50%  Psychosocial & Spiritual Assessment  Palliative Care Outcomes      Time In: 1450 Time Out: 1600 Time Total: 70  minutes Greater than 50%  of this time was spent counseling and coordinating care related to the above assessment and plan.  Signed by: Drue Novel, NP   Please contact Palliative Medicine Team phone at [phone] for questions and concerns.  For individual provider: See Shea Evans

## 2018-07-18 NOTE — Progress Notes (Signed)
[place] [facility] [place] NAME: Chelsey [name]    MR#:  [id number]  DATE OF BIRTH:  12/24/1950  SUBJECTIVE:  CHIEF COMPLAINT:   Chief Complaint  Patient presents with  . Fever  . Hypotension  + abdominal pain, reports constipation, feels weak and would like to go to rehab if possible REVIEW OF SYSTEMS:  Review of Systems  Constitutional: Negative for diaphoresis, fever, malaise/fatigue and weight loss.  HENT: Negative for ear pain, hearing loss, nosebleeds, sore throat and tinnitus.   Eyes: Negative for blurred vision and pain.  Respiratory: Negative for cough, hemoptysis, shortness of breath and wheezing.   Cardiovascular: Negative for chest pain, palpitations, orthopnea and leg swelling.  Gastrointestinal: Positive for abdominal pain and constipation. Negative for blood in stool, diarrhea, heartburn, nausea and vomiting.  Genitourinary: Negative for dysuria, frequency and urgency.  Musculoskeletal: Negative for back pain and myalgias.  Skin: Negative for itching and rash.  Neurological: Negative for dizziness, tingling, tremors, focal weakness, seizures, weakness and headaches.  Psychiatric/Behavioral: Negative for depression. The patient is not nervous/anxious.    DRUG ALLERGIES:  No Known Allergies VITALS:  Blood pressure 94/64, pulse 70, temperature 98.1 F (36.7 C), temperature source Oral, resp. rate 17, height 5\' 5"  (1.651 m), weight 50.8 kg, SpO2 98 %. PHYSICAL EXAMINATION:  Physical Exam HENT:     Head: Normocephalic and atraumatic.  Eyes:     Conjunctiva/sclera: Conjunctivae normal.     Pupils: Pupils are equal, round, and reactive to light.  Neck:     Musculoskeletal: Normal range of motion and neck supple.     Thyroid: No thyromegaly.     Trachea: No tracheal deviation.  Cardiovascular:     Rate and Rhythm: Normal rate and regular rhythm.     Heart sounds: Normal heart sounds.  Pulmonary:     Effort: Pulmonary effort  is normal. No respiratory distress.     Breath sounds: Normal breath sounds. No wheezing.  Chest:     Chest wall: No tenderness.  Abdominal:     General: Bowel sounds are normal. There is no distension.     Palpations: Abdomen is soft.     Tenderness: There is no abdominal tenderness.  Musculoskeletal: Normal range of motion.  Skin:    General: Skin is warm and dry.     Findings: No rash.  Neurological:     Mental Status: She is alert and oriented to person, place, and time.     Cranial Nerves: No cranial nerve deficit.    LABORATORY PANEL:  Female CBC Recent Labs  Lab 07/18/18 0420  WBC 8.4  HGB 10.9*  HCT 34.0*  PLT 504*   ------------------------------------------------------------------------------------------------------------------ Chemistries  Recent Labs  Lab 07/17/18 0648 07/18/18 0420  NA 136 133*  K 3.4* 3.4*  CL 106 99  CO2 23 26  GLUCOSE 92 107*  BUN 7* 6*  CREATININE 0.48 0.42*  CALCIUM 7.9* 7.7*  MG 2.1  --   AST  --  19  ALT  --  20  ALKPHOS  --  107  BILITOT  --  0.5   RADIOLOGY:  No results found. ASSESSMENT AND PLAN:   *Suspected septic shock: Present on admission of Unclear etiology. May be abdominal source. Continue cefepim + flagyl - Continue empiric antibiotics, follow-up CBC and blood culture, no acute pathology seen on abdominal CT.  - BP improved.  * Persistentdysphagia after esophageal narrowing and dilation:  S/p EGD on 6/21 showing  Benign-appearing esophageal stenosis. -PEG tube changed on 6/21.  The previously placed PEG tube had a knot in the jejunal tube.  Replacement was uneventful.  The patient has been following up at [facility] for esophageal dilation and should continue to follow-up with them per GI  * Hyponatremia: improved with NS.  * Hypokalemia replete and recheck  * Possible bronchitis.  Robitussin as needed.  *History of hernia repair in February: outpt surgery f/up. No acute issues per surgery eval  * Tobacco  abuse.  Smoking cessation was counseled for 3 to 4 minutes.  * Constipation: on BM regimen, had small BM day before. Likely not eating much  * overall poor prognosis - PC c/s   PT recommends STR/SNF - CM/SW aware and working on it    All the records are reviewed and case discussed with Care Management/Social Worker. Management plans discussed with the patient, Dr Allen Norris and they are in agreement.  CODE STATUS: DNR  TOTAL TIME TAKING CARE OF THIS PATIENT: 35 minutes.   More than 50% of the time was spent in counseling/coordination of care: YES  POSSIBLE D/C IN 1-2 DAYS, DEPENDING ON CLINICAL CONDITION.   Max Sane M.D on 07/18/2018 at 3:40 PM  Between 7am to 6pm - Pager - [phone]  After 6pm go to [email or web address] - Proofreader  [facility] [place]  Office  [phone]  CC: Primary care physician; Guadalupe Maple, MD  Note: This dictation was prepared with Dragon dictation along with smaller phrase technology. Any transcriptional errors that result from this process are unintentional.

## 2018-07-18 NOTE — TOC Initial Note (Signed)
Transition of Care [facility]) - Initial/Assessment Note    Patient Details  Name: Chelsey [name] MRN: [id number] Date of Birth: February 13, 1950  Transition of Care [facility]) CM/SW Contact:    Beverly Sessions, RN Phone Number: 07/18/2018, 4:22 PM  Clinical Narrative:   Patient admitted from home with suspected septic shock.  Patient lives at home with son (however he works full time).    [facility]P [facility]. Patient denies issues with transportation of with obtaining medications  Patient recently closed with [facility] home health  PT has assessed patient and recommends SNF.  Patient agreeable.  Bed search was completed.  Patient selected [facility].  Accepted in [place] at [facility] notified.                     Expected Discharge Plan: Skilled Nursing Facility Barriers to Discharge: Continued Medical Work up   Patient Goals and CMS Choice        Expected Discharge Plan and Services Expected Discharge Plan: [place]                                              Prior Living Arrangements/Services     Patient language and need for interpreter reviewed:: Yes Do you feel safe going back to the place where you live?: Yes      Need for Family Participation in Patient Care: Yes (Comment) Care giver support system in place?: Yes (comment)   Criminal Activity/Legal Involvement Pertinent to Current Situation/Hospitalization: No - Comment as needed  Activities of Daily Living Home Assistive Devices/Equipment: None ADL Screening (condition at time of admission) Patient's cognitive ability adequate to safely complete daily activities?: Yes Is the patient deaf or have difficulty hearing?: No Does the patient have difficulty seeing, even when wearing glasses/contacts?: No Does the patient have difficulty concentrating, remembering, or making decisions?: No Patient able to express need for assistance with ADLs?: Yes Does the  patient have difficulty dressing or bathing?: Yes(Left shoulder) Independently performs ADLs?: Yes (appropriate for developmental age) Does the patient have difficulty walking or climbing stairs?: No Weakness of Legs: Both Weakness of Arms/Hands: Left  Permission Sought/Granted                  Emotional Assessment Appearance:: Appears stated age   Affect (typically observed): Tearful/Crying     Psych Involvement: No (comment)  Admission diagnosis:  Sepsis, due to unspecified organism, unspecified whether acute organ dysfunction present [facility]) [A41.9] Patient Active Problem List   Diagnosis Date Noted  . PEG tube malfunction ([place])   . Dysphasia   . Sepsis ([place]) 07/14/2018  . Depression with suicidal ideation 04/11/2018  . Dehydration 03/28/2018  . Protein-calorie malnutrition, severe 03/11/2018  . S/P repair of paraesophageal hernia 03/09/2018  . Benign neoplasm of ascending colon   . Osteoporosis 07/27/2017  . Tobacco abuse 07/27/2017  . Painful orthopaedic hardware ([place]) 06/24/2017  . Overweight with body mass index (BMI) 25.0-29.9 05/18/2017  . 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 Insomnia 01/12/2017  . Anemia 01/12/2017  . Status post shoulder replacement 12/27/2016  . Pressure injury of skin 12/27/2016  . S/P ORIF (open reduction internal fixation) fracture 11/23/2016  . Proximal humerus fracture 11/23/2016  . Panic attack 08/17/2016  . Abdominal pain 05/21/2016  . Special screening for malignant neoplasms, colon   .  Benign neoplasm of descending colon   . Benign neoplasm of sigmoid colon   . Gastroesophageal reflux disease without esophagitis 06/13/2014  . H/O hypercholesterolemia 06/13/2014  . Depression, major, recurrent, moderate ([place]) 06/13/2014  . History of breast cancer 03/12/2013   PCP:  Guadalupe Maple, MD Pharmacy:   [facility], [place] [address]  [address] Phone: [phone] Fax: [phone]  [id number] [place], [place] [address] [place] [address] Phone: [phone] Fax: [phone]     Social Determinants of Health (SDOH) Interventions    Readmission Risk Interventions No flowsheet data found.

## 2018-07-19 ENCOUNTER — Ambulatory Visit: Admission: RE | Admit: 2018-07-19 | Payer: Federal, State, Local not specified - PPO | Source: Ambulatory Visit

## 2018-07-19 LAB — CULTURE, BLOOD (ROUTINE X 2)
Culture: NO GROWTH
Culture: NO GROWTH
Special Requests: ADEQUATE
Special Requests: ADEQUATE

## 2018-07-19 LAB — BASIC METABOLIC PANEL
Anion gap: 5 (ref 5–15)
BUN: <5 mg/dL — ABNORMAL LOW (ref 8–23)
CO2: 26 mmol/L (ref 22–32)
Calcium: 7.9 mg/dL — ABNORMAL LOW (ref 8.9–10.3)
Chloride: 105 mmol/L (ref 98–111)
Creatinine, Ser: 0.37 mg/dL — ABNORMAL LOW (ref 0.44–1.00)
GFR calc Af Amer: >60 mL/min (ref >60)
GFR calc non Af Amer: >60 mL/min (ref >60)
Glucose, Bld: 98 mg/dL (ref 70–99)
Potassium: 3.2 mmol/L — ABNORMAL LOW (ref 3.5–5.1)
Sodium: 136 mmol/L (ref 135–145)

## 2018-07-19 LAB — CBC
HCT: 31.9 % — ABNORMAL LOW (ref 36.0–46.0)
Hemoglobin: 10.2 g/dL — ABNORMAL LOW (ref 12.0–15.0)
MCH: 28.1 pg (ref 26.0–34.0)
MCHC: 32 g/dL (ref 30.0–36.0)
MCV: 87.9 fL (ref 80.0–100.0)
Platelets: 469 10*3/uL — ABNORMAL HIGH (ref 150–400)
RBC: 3.63 MIL/uL — ABNORMAL LOW (ref 3.87–5.11)
RDW: 13.1 % (ref 11.5–15.5)
WBC: 7 10*3/uL (ref 4.0–10.5)
nRBC: 0 % (ref 0.0–0.2)

## 2018-07-19 MED ORDER — POTASSIUM CHLORIDE CRYS ER 20 MEQ PO TBCR
40.0000 meq | EXTENDED_RELEASE_TABLET | Freq: Once | ORAL | Status: DC
  Filled 2018-07-19: qty 2

## 2018-07-19 MED ORDER — POTASSIUM CHLORIDE 20 MEQ PO PACK
40.0000 meq | PACK | Freq: Once | ORAL | Status: AC
  Administered 2018-07-19: 13:00:00 40 meq via ORAL
  Filled 2018-07-19: qty 2

## 2018-07-19 MED ORDER — POLYETHYLENE GLYCOL 3350 17 G PO PACK: 17.0000 g | PACK | Freq: Every day | ORAL | 0 refills | Status: DC

## 2018-07-19 MED ORDER — OXYCODONE HCL 5 MG PO TABS: 5.0000 mg | ORAL_TABLET | ORAL | 0 refills | Status: DC | PRN

## 2018-07-19 MED ORDER — OXYCODONE HCL ER 10 MG PO T12A: 10.0000 mg | EXTENDED_RELEASE_TABLET | Freq: Two times a day (BID) | ORAL | 0 refills | Status: AC

## 2018-07-19 MED ORDER — BISACODYL 5 MG PO TBEC: 5.0000 mg | DELAYED_RELEASE_TABLET | Freq: Every day | ORAL | 0 refills | Status: DC

## 2018-07-19 NOTE — Progress Notes (Signed)
Report called to Viviann Spare, Therapist, sports, at [facility].  Understanding was verbalized and all questions were answered.  Patient discharged in stable condition escorted by nursing staff.  Transported by sister.

## 2018-07-19 NOTE — TOC Transition Note (Addendum)
Transition of Care [facility]) - CM/SW Discharge Note   Patient Details  Name: Chelsey [name] MRN: [id number] Date of Birth: April 17, 1950  Transition of Care [facility]) CM/SW Contact:  Beverly Sessions, RN Phone Number: 07/19/2018, 4:19 PM   Clinical Narrative:    Discharge order placed today.   RNCM notified patient that she would be discharging today to [facility]. Patient states that she is thinking she has changed her mind and wants to go home. Requests that I speak with her sister. Spoke with sister and patient on speaker phone at length.  They have made the decision that due to financial concerns for patient to return home with home health.  Patient declines for RW and BSC to be delivered.  Sister to transport at discharge.   Update:  After patient go up to get dressed she reached out to [facility] and states "I feel like I have made a terrible decision.  I am so weak I probably should have went to rehab".  MD spoke with patient via home and notified patient that her recommendation is also SNF. Patient now wants to go to SNF and request that I call her sister again.  After another long discussion alone with Claiborne Billings From [facility].  Patient and sister have chosen for her to discharge to [facility].   SNF transfer report and FL2 submitted in [place].  Sister to transport to facility   Outpatient Palliatve referral made to Santiago Glad with [facility]    Final next level of care: Skilled Nursing Facility Barriers to Discharge: Barriers Resolved   Patient Goals and CMS Choice Patient states their goals for this hospitalization and ongoing recovery are:: I just want to get better      Discharge Placement              Patient chooses bed at: [facility] Patient to be transferred to facility by: Sister      Discharge Plan and Services                                     Social Determinants of Health (SDOH) Interventions     Readmission Risk  Interventions No flowsheet data found.

## 2018-07-19 NOTE — Discharge Summary (Signed)
[facility] [place] NAME: Chelsey [name]    MR#:  [id number]  DATE OF BIRTH:  07-07-1950  DATE OF ADMISSION:  07/14/2018 ADMITTING PHYSICIAN: Demetrios Loll, MD  DATE OF DISCHARGE: 07/19/2018  PRIMARY CARE PHYSICIAN: Guadalupe Maple, MD    ADMISSION DIAGNOSIS:  Sepsis, due to unspecified organism, unspecified whether acute organ dysfunction present ([place]) [A41.9]  DISCHARGE DIAGNOSIS:  Active Problems:      PEG tube malfunction ([place])   Dysphasia   Goals of care, counseling/discussion   Palliative care by specialist   SECONDARY DIAGNOSIS:   Past Medical History:  Diagnosis Date  . Breast cancer ([place]) 1997   right breast, radiation  . Bronchitis    recent/ 06/09/15 had chest xray/ [facility]/resolved  . Cancer [facility]) 1997   right lumpectomy,L/Ad/R   . GERD (gastroesophageal reflux disease)   . History of hiatal hernia   . Hypercholesteremia   . Hyperlipidemia   . Low BP    TYPICALLY RUNS 80'S/60'S  . Panic attack   . Personal history of malignant neoplasm of breast     HOSPITAL COURSE:  68 year old female with history of dysphasia and PEG tube who presented to the ER with fever and low blood pressure. 1.  Fever: Patient was initially felt to have sepsis/septic shock however sepsis has been ruled out.  No evidence of septic shock.  All antibiotics have been discontinued.  2.  Persistent dysphagia after esophageal narrowing and dilation: Patient status post EGD June 21 showing benign-appearing esophageal stenosis.  PEG tube was changed on June 21.  Patient will follow-up at [facility] for esophageal dilation.  3.  Hyponatremia: This improved with IV fluids.  4.  Hypokalemia: This was repleted  5.  Chronic abdominal pain with intermittent nausea: Patient evaluated by palliative care for symptom management. Recommendations are for oxycodone ER 10 mg p.o. twice daily with as needed oxycodone IR. Zofran PRN for nausea.  She will  have outpatient palliative care services.  DISCHARGE CONDITIONS AND DIET:   Stable for discharge regular diet  CONSULTS OBTAINED:    DRUG ALLERGIES:  No Known Allergies  DISCHARGE MEDICATIONS:   Allergies as of 07/19/2018   No Known Allergies     Medication List    STOP taking these medications   ibuprofen 200 MG tablet Commonly known as: ADVIL   lidocaine 5 % ointment Commonly known as: XYLOCAINE   oxyCODONE-acetaminophen 7.5-325 MG tablet Commonly known as: Percocet     TAKE these medications   bisacodyl 5 MG EC tablet Commonly known as: DULCOLAX Take 1 tablet (5 mg total) by mouth daily.   busPIRone 10 MG tablet Commonly known as: BUSPAR Take 1 tablet (10 mg total) by mouth 2 (two) times daily.   CALCIUM + D PO Take 1 tablet daily by mouth.   feeding supplement (OSMOLITE 1.5 CAL) Liqd Place 237 mLs into feeding tube 4 (four) times daily.   GAS-X PO Take 1-2 tablets by mouth daily as needed (gas).   multivitamin tablet Take 1 tablet daily by mouth.   omeprazole 20 MG capsule Commonly known as: PRILOSEC Take 1 capsule (20 mg total) by mouth 2 (two) times daily before a meal.   oxyCODONE 10 mg 12 hr tablet Commonly known as: OXYCONTIN Take 1 tablet (10 mg total) by mouth every 12 (twelve) hours for 7 days.   oxyCODONE 5 MG immediate release tablet Commonly known as: Oxy IR/ROXICODONE Take 1 tablet (5 mg total)  by mouth every 4 (four) hours as needed for moderate pain or severe pain.   PARoxetine 20 MG tablet Commonly known as: PAXIL Take 1 tablet (20 mg total) by mouth daily before supper.   polyethylene glycol 17 g packet Commonly known as: MIRALAX / GLYCOLAX Take 17 g by mouth daily.   simvastatin 20 MG tablet Commonly known as: ZOCOR TAKE ONE TABLET BY MOUTH AT BEDTIME   traZODone 100 MG tablet Commonly known as: DESYREL Take 1 tablet (100 mg total) by mouth at bedtime.   Vitamin D 50 MCG (2000 UT) tablet Take 2,000 Units by mouth  daily.         Today   CHIEF COMPLAINT:   Mild abdominal pain no nausea   VITAL SIGNS:  Blood pressure 110/71, pulse 66, temperature 98 F (36.7 C), temperature source Oral, resp. rate 16, height 5\' 5"  (1.651 m), weight 50.8 kg, SpO2 100 %.   REVIEW OF SYSTEMS:  Review of Systems  Constitutional: Negative.  Negative for chills, fever and malaise/fatigue.  HENT: Negative.  Negative for ear discharge, ear pain, hearing loss, nosebleeds and sore throat.   Eyes: Negative.  Negative for blurred vision and pain.  Respiratory: Negative.  Negative for cough, hemoptysis, shortness of breath and wheezing.   Cardiovascular: Negative.  Negative for chest pain, palpitations and leg swelling.  Gastrointestinal: Positive for abdominal pain (better). Negative for blood in stool, diarrhea, nausea and vomiting.  Genitourinary: Negative.  Negative for dysuria.  Musculoskeletal: Negative.  Negative for back pain.  Skin: Negative.   Neurological: Negative for dizziness, tremors, speech change, focal weakness, seizures and headaches.  Endo/Heme/Allergies: Negative.  Does not bruise/bleed easily.  Psychiatric/Behavioral: Negative.  Negative for depression, hallucinations and suicidal ideas.     PHYSICAL EXAMINATION:  GENERAL:  68 y.o.-year-old patient lying in the bed with no acute distress.  NECK:  Supple, no jugular venous distention. No thyroid enlargement, no tenderness.  LUNGS: Normal breath sounds bilaterally, no wheezing, rales,rhonchi  No use of accessory muscles of respiration.  CARDIOVASCULAR: S1, S2 normal. No murmurs, rubs, or gallops.  ABDOMEN: Soft, non-tender, non-distended. Bowel sounds present. No organomegaly or mass.  PEG tube EXTREMITIES: No pedal edema, cyanosis, or clubbing.  PSYCHIATRIC: The patient is alert and oriented x 3.  SKIN: No obvious rash, lesion, or ulcer.   DATA REVIEW:   CBC Recent Labs  Lab 07/19/18 0434  WBC 7.0  HGB 10.2*  HCT 31.9*  PLT 469*     Chemistries  Recent Labs  Lab 07/17/18 0648 07/18/18 0420 07/19/18 0434  NA 136 133* 136  K 3.4* 3.4* 3.2*  CL 106 99 105  CO2 23 26 26   GLUCOSE 92 107* 98  BUN 7* 6* <5*  CREATININE 0.48 0.42* 0.37*  CALCIUM 7.9* 7.7* 7.9*  MG 2.1  --   --   AST  --  19  --   ALT  --  20  --   ALKPHOS  --  107  --   BILITOT  --  0.5  --     Cardiac Enzymes No results for input(s): TROPONINI in the last 168 hours.  Microbiology Results  @MICRORSLT48 @  RADIOLOGY:  No results found.    Allergies as of 07/19/2018   No Known Allergies     Medication List    STOP taking these medications   ibuprofen 200 MG tablet Commonly known as: ADVIL   lidocaine 5 % ointment Commonly known as: XYLOCAINE   oxyCODONE-acetaminophen 7.5-325 MG  tablet Commonly known as: Percocet     TAKE these medications   bisacodyl 5 MG EC tablet Commonly known as: DULCOLAX Take 1 tablet (5 mg total) by mouth daily.   busPIRone 10 MG tablet Commonly known as: BUSPAR Take 1 tablet (10 mg total) by mouth 2 (two) times daily.   CALCIUM + D PO Take 1 tablet daily by mouth.   feeding supplement (OSMOLITE 1.5 CAL) Liqd Place 237 mLs into feeding tube 4 (four) times daily.   GAS-X PO Take 1-2 tablets by mouth daily as needed (gas).   multivitamin tablet Take 1 tablet daily by mouth.   omeprazole 20 MG capsule Commonly known as: PRILOSEC Take 1 capsule (20 mg total) by mouth 2 (two) times daily before a meal.   oxyCODONE 10 mg 12 hr tablet Commonly known as: OXYCONTIN Take 1 tablet (10 mg total) by mouth every 12 (twelve) hours for 7 days.   oxyCODONE 5 MG immediate release tablet Commonly known as: Oxy IR/ROXICODONE Take 1 tablet (5 mg total) by mouth every 4 (four) hours as needed for moderate pain or severe pain.   PARoxetine 20 MG tablet Commonly known as: PAXIL Take 1 tablet (20 mg total) by mouth daily before supper.   polyethylene glycol 17 g packet Commonly known as: MIRALAX /  GLYCOLAX Take 17 g by mouth daily.   simvastatin 20 MG tablet Commonly known as: ZOCOR TAKE ONE TABLET BY MOUTH AT BEDTIME   traZODone 100 MG tablet Commonly known as: DESYREL Take 1 tablet (100 mg total) by mouth at bedtime.   Vitamin D 50 MCG (2000 UT) tablet Take 2,000 Units by mouth daily.           Management plans discussed with the patient and she is in agreement. Stable for discharge   Patient should follow up with outpatient palliative care services and PCP.  CODE STATUS:     Code Status Orders  (From admission, onward)         Start     Ordered   07/14/18 2111  Do not attempt resuscitation (DNR)  Continuous    Question Answer Comment  In the event of cardiac or respiratory ARREST Do not call a "code blue"   In the event of cardiac or respiratory ARREST Do not perform Intubation, CPR, defibrillation or ACLS   In the event of cardiac or respiratory ARREST Use medication by any route, position, wound care, and other measures to relive pain and suffering. May use oxygen, suction and manual treatment of airway obstruction as needed for comfort.      07/14/18 2110        Code Status History    Date Active Date Inactive Code Status Order ID Comments User Context   03/28/2018 1216 04/04/2018 2159 Full Code [id number]  Carlus Pavlov Inpatient   03/09/2018 1829 03/20/2018 2252 Full Code [id number]  Jules Husbands, MD Inpatient   12/27/2016 1700 12/29/2016 1434 Full Code [id number]  Leim Fabry, MD Inpatient   11/23/2016 2150 11/25/2016 1052 Full Code [id number]  Leim Fabry, MD Inpatient   Advance Care Planning Activity      TOTAL TIME TAKING CARE OF THIS PATIENT: 38 minutes.    Note: This dictation was prepared with Dragon dictation along with smaller phrase technology. Any transcriptional errors that result from this process are unintentional.  Bettey Costa M.D on 07/19/2018 at 11:48 AM  Between 7am to 6pm - Pager - [phone] After 6pm go to  [email or web address] -  password [email or web address] [facility]  Office  [phone]  CC: Primary care physician; Guadalupe Maple, MD

## 2018-07-19 NOTE — Progress Notes (Signed)
New referral for outpatient Palliative to follow at [facility] received from [place]. Plan is for discharge today. Patient information faxed to referral. Flo Shanks BSN, RN, [place] [phone]

## 2018-07-19 NOTE — NC FL2 (Signed)
[place] LEVEL OF CARE SCREENING TOOL     IDENTIFICATION  Patient Name: Chelsey [name] Birthdate: 1950/05/31 Sex: female Admission Date (Current Location): 07/14/2018  [place] and [place] Number:  Engineering geologist and Address:  [facility], [address] [place], [place] [address]      Provider Number: [id number]  Attending Physician Name and Address:  Bettey Costa, MD  Relative Name and Phone Number:       Current Level of Care: SNF Recommended Level of Care: [place] Prior Approval Number:    Date Approved/Denied:   PASRR Number: [id number]  Discharge Plan: SNF    Current Diagnoses: Patient Active Problem List   Diagnosis Date Noted  . Goals of care, counseling/discussion   . Palliative care by specialist   . PEG tube malfunction ([place])   . Dysphasia   . Sepsis ([place]) 07/14/2018  . Depression with suicidal ideation 04/11/2018  . Dehydration 03/28/2018  . Protein-calorie malnutrition, severe 03/11/2018  . S/P repair of paraesophageal hernia 03/09/2018  . Benign neoplasm of ascending colon   . Osteoporosis 07/27/2017  . Tobacco abuse 07/27/2017  . Painful orthopaedic hardware ([place]) 06/24/2017  . Overweight with body mass index (BMI) 25.0-29.9 05/18/2017  . 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 Insomnia 01/12/2017  . Anemia 01/12/2017  . Status post shoulder replacement 12/27/2016  . Pressure injury of skin 12/27/2016  . S/P ORIF (open reduction internal fixation) fracture 11/23/2016  . Proximal humerus fracture 11/23/2016  . Panic attack 08/17/2016  . Abdominal pain 05/21/2016  . Special screening for malignant neoplasms, colon   . Benign neoplasm of descending colon   . Benign neoplasm of sigmoid colon   . Gastroesophageal reflux disease without esophagitis 06/13/2014  . H/O hypercholesterolemia 06/13/2014  . Depression, major, recurrent, moderate ([place]) 06/13/2014  .  History of breast cancer 03/12/2013    Orientation RESPIRATION BLADDER Height & Weight     Self, Time, Situation  Normal Continent Weight: 50.8 kg Height:  5\' 5"  (165.1 cm)  BEHAVIORAL SYMPTOMS/MOOD NEUROLOGICAL BOWEL NUTRITION STATUS      Continent Diet(Full liquid by mouth.  4 bolus feeds daily through peg tube)  AMBULATORY STATUS COMMUNICATION OF NEEDS Skin   Extensive Assist Verbally Normal                       Personal Care Assistance Level of Assistance  Feeding Bathing Assistance: Limited assistance   Dressing Assistance: Limited assistance     Functional Limitations Info             SPECIAL CARE FACTORS FREQUENCY  PT (By licensed PT), OT (By licensed OT)                    Contractures Contractures Info: Not present    Additional Factors Info  Code Status, Allergies(DNR) Code Status Info: DNR Allergies Info: NKDA           Current Medications (07/19/2018):  This is the current hospital active medication list Current Facility-Administered Medications  Medication Dose Route Frequency Provider Last Rate Last Dose  . 0.9 %  sodium chloride infusion   Intravenous Continuous Demetrios Loll, MD   Stopped at 07/19/18 0930  . acetaminophen (TYLENOL) tablet 650 mg  650 mg Oral Q6H PRN Demetrios Loll, MD       Or  . acetaminophen (TYLENOL) suppository 650 mg  650 mg Rectal Q6H PRN Demetrios Loll, MD      .  albuterol (PROVENTIL) (2.5 MG/3ML) 0.083% nebulizer solution 2.5 mg  2.5 mg Nebulization Q2H PRN Demetrios Loll, MD      . bisacodyl (DULCOLAX) EC tablet 5 mg  5 mg Oral Daily Max Sane, MD   5 mg at 07/19/18 1028  . bisacodyl (DULCOLAX) suppository 10 mg  10 mg Rectal Daily PRN [name] A, NP   10 mg at 07/18/18 1618  . busPIRone (BUSPAR) tablet 10 mg  10 mg Oral BID Demetrios Loll, MD   10 mg at 07/18/18 2234  . enoxaparin (LOVENOX) injection 40 mg  40 mg Subcutaneous Q24H Demetrios Loll, MD   40 mg at 07/18/18 2236  . feeding supplement (OSMOLITE 1.5 CAL) liquid 237  mL  237 mL Per Tube QID Demetrios Loll, MD 0 mL/hr at 07/19/18 0700 237 mL at 07/19/18 1027  . ondansetron (ZOFRAN) tablet 4 mg  4 mg Oral Q8H Dove, Tasha A, NP      . oxyCODONE (Oxy IR/ROXICODONE) immediate release tablet 5 mg  5 mg Oral Q4H PRN Dove, Tasha A, NP   5 mg at 07/19/18 1422  . oxyCODONE (OXYCONTIN) 12 hr tablet 10 mg  10 mg Oral Q12H Dove, Tasha A, NP   10 mg at 07/19/18 1029  . pantoprazole (PROTONIX) EC tablet 40 mg  40 mg Oral Daily Demetrios Loll, MD   40 mg at 07/18/18 0951  . PARoxetine (PAXIL) tablet 20 mg  20 mg Oral QAC supper Demetrios Loll, MD   20 mg at 07/18/18 1619  . polyethylene glycol (MIRALAX / GLYCOLAX) packet 17 g  17 g Oral Daily Manuella Ghazi, Vipul, MD   17 g at 07/19/18 1026  . senna-docusate (Senokot-S) tablet 2 tablet  2 tablet Oral BID Max Sane, MD   2 tablet at 07/19/18 1029  . simvastatin (ZOCOR) tablet 20 mg  20 mg Oral QHS Demetrios Loll, MD   20 mg at 07/17/18 2223  . traZODone (DESYREL) tablet 100 mg  100 mg Oral QHS Demetrios Loll, MD   100 mg at 07/18/18 2235     Discharge Medications: Please see discharge summary for a list of discharge medications.  Relevant Imaging Results:  Relevant Lab Results:   Additional Information ss [id number]  Beverly Sessions, RN

## 2018-07-19 NOTE — Progress Notes (Signed)
Palliative: Chelsey [name] is resting quietly in bed.  She is alert and oriented, calm and pleasant.  Nursing staff is at bedside administering tube feeding.  There are no visitors at bedside at this time due to visitor restrictions.  Chelsey [name] shares that her PEG tubing is still sore at the insertion site.  We talked about securing PEG tube so it does not move at insertion site.  We talked about pain medication management.  It seems that overnight Chelsey [name] was given immediate release instead of extended release oxycodone.  She tells me that she had pain later in the night and had another immediate release oxycodone around 5 AM.  We talked about the importance of extended release medications.  Also encouraged her to take her scheduled antinausea medication.  Conference with bedside nursing staff and case management/social work related to patient condition, needs, symptom management, disposition.  Plan: Discharge to rehab today with palliative services to follow for symptom management.  31 minutes Chelsey Axe, NP Palliative Medicine Team Team Phone # [phone] Greater than 50% of this time was spent counseling and coordinating care related to the above assessment and plan.

## 2018-07-20 ENCOUNTER — Other Ambulatory Visit: Payer: Self-pay | Admitting: Gastroenterology

## 2018-07-26 ENCOUNTER — Other Ambulatory Visit: Payer: Self-pay

## 2018-07-26 ENCOUNTER — Encounter: Payer: Self-pay | Admitting: Nurse Practitioner

## 2018-07-26 ENCOUNTER — Ambulatory Visit: Payer: Medicare Other | Admitting: Surgery

## 2018-07-26 ENCOUNTER — Non-Acute Institutional Stay: Payer: Self-pay | Admitting: Nurse Practitioner

## 2018-07-26 VITALS — BP 118/74 | HR 75 | Temp 97.9°F | Resp 18 | Wt 135.6 lb

## 2018-07-26 DIAGNOSIS — Z515 Encounter for palliative care: Secondary | ICD-10-CM

## 2018-07-26 DIAGNOSIS — R1310 Dysphagia, unspecified: Secondary | ICD-10-CM | POA: Insufficient documentation

## 2018-07-26 DIAGNOSIS — R630 Anorexia: Secondary | ICD-10-CM | POA: Insufficient documentation

## 2018-07-26 NOTE — Progress Notes (Signed)
[facility] Consult Note Telephone: [phone]  Fax: [phone]  PATIENT NAME: Chelsey [name] DOB: Dec 09, 1950 MRN: [id number]  PRIMARY CARE PROVIDER:   Guadalupe Maple, MD  REFERRING PROVIDER:  Dr [facility] RESPONSIBLE PARTY:   Sister Florestine Avers [phone] Kerri Kovacik Son [phone]  I was asked by Dr Nyra Capes to see Chelsey [name] for [facility] consult for [place] and PLAN:  1. Palliative care encounter Z51.5; Palliative medicine team will continue to support patient, patient's family, and medical team. Visit consisted of counseling and education dealing with the complex and emotionally intense issues of symptom management and palliative care in the setting of serious and potentially life-threatening illness  2. Dysphagia R13.10; secondary to esoghageal stricture . Continue to monitor weights, appetite, aspiration precautions. Speech  3. Anorexia R63.0 secondary to protein calorie malnutrition. Continue to monitor daily weights, supplements, supportive measures and courage to eat  4. Nausea and vomiting R11.0; continue antimedic; encourage oral hydration  ASSESSMENT:   I visited and observed Chelsey [name]. We talked about purpose for palliative care visit and she was in verbal agreement. We talked about past medical history, chronic disease progression. We talked about her functional level prior to hospitalization and currently. We talked about her recent hospitalization at length. We talked about her decision to come to short-term rehab. We talked about esophageal stricture and recommendation for dilatation at [facility]. We talked about frustration with foods. She talked about her esophagus being punctured and repaired but hasn't been the same. We talked about expectations with therapy including speech. Her wishes are to return to a soft diet. We talked about the vomiting and relief with Zofran. We talked about  symptoms of pain what she does have epigastric pain. She shared that the current pain regiment has been effective and she was grateful that was started prior to being discharged from the hospital. We talked about Life review. We talked about her living at home with her son. We talked about medical goals of care and her wishes are to treat what is treatable though she is a DNR. We talked about role of palliative care and plan of care. Discuss will follow up in 2 weeks if needed or sooner should she did fine. Chelsey [name] in agreement. Therapeutic listening and emotional support provided. Contact information. Questions answered to satisfaction. I updated nursing staff. Any changes to current goals or plan of care.   I spent 60 minutes providing this consultation,  from 10:30am to 11:30am. More than 50% of the time in this consultation was spent coordinating communication.   HISTORY OF PRESENT ILLNESS:  Chelsey [name] is a 68 y.o. year old female with multiple medical problems including Breast cancer with radiation 1997, dysphagia with peg tube essential to sustain life, hyperlipidemia, hypercholesterolemia, history of hiatal hernia, gerd, panic attack. Hospitalize 6 / 19 / 2020 to 6 / 24 / 2020 for fevers, initial workup felt to be septic, septic shock however sepsis was ruled out with no evidence of shock and antibiotics discontinued. Persistent dysphasia after esophageal narrowing and dilatation s/p EGD 6 / 21 / 2020 showing benign appearance of esophageal stenosis. Peg tube was changed on 07/16/2018 and follow up at [facility] for esophageal dilatation. Electrolyte imbalance replace. Chronic abdominal pain with intermittent nausea with recommendations to treat with 0xycodone ER 10mg  bid, Oxycodone IR as needed, Zofran as needed for nausea. She was discharged to short-term rehab at [facility] where  she currently resides. She is ambulatory in the room, performs adl's and toilets herself. She does feed  herself. Speech has been working with her with food textures and today switched back to liquids. Speech therapist endorses when she was eating soft foods which is her goal that she vomits almost immediately. She does continue tube feedings. She is oriented, able to verbalize her needs. At present she is sitting on the side of the bed, appears then but comfortable. No visitors present. Palliative Care was asked to help address goals of care.   CODE STATUS: DNR  PPS: 50% HOSPICE ELIGIBILITY/DIAGNOSIS: TBD  PAST MEDICAL HISTORY:  Past Medical History:  Diagnosis Date   Breast cancer ([place]) 1997   right breast, radiation   Bronchitis    recent/ 06/09/15 had chest xray/ [facility]/resolved   Cancer [facility]) 1997   right lumpectomy,L/Ad/R    GERD (gastroesophageal reflux disease)    History of hiatal hernia    Hypercholesteremia    Hyperlipidemia    Low BP    TYPICALLY RUNS 80'S/60'S   Panic attack    Personal history of malignant neoplasm of breast     SOCIAL HX:  Social History   Tobacco Use   Smoking status: Current Some Day Smoker    Packs/day: 0.10    Years: 10.00    Pack years: 1.00    Types: Cigarettes    Start date: 11/25/2009   Smokeless tobacco: Never Used   Tobacco comment: 1 cigarette daily  Substance Use Topics   Alcohol use: No    Alcohol/week: 0.0 standard drinks    ALLERGIES: No Known Allergies   PERTINENT MEDICATIONS:  Outpatient Encounter Medications as of 07/26/2018  Medication Sig   bisacodyl (DULCOLAX) 5 MG EC tablet Take 1 tablet (5 mg total) by mouth daily.   busPIRone (BUSPAR) 10 MG tablet Take 1 tablet (10 mg total) by mouth 2 (two) times daily.   Calcium Carbonate-Vitamin D (CALCIUM + D PO) Take 1 tablet daily by mouth.    Cholecalciferol (VITAMIN D) 2000 units tablet Take 2,000 Units by mouth daily.   Multiple Vitamin (MULTIVITAMIN) tablet Take 1 tablet daily by mouth.    Nutritional Supplements (FEEDING SUPPLEMENT, OSMOLITE  1.5 CAL,) LIQD Place 237 mLs into feeding tube 4 (four) times daily.   omeprazole (PRILOSEC) 20 MG capsule Take 1 capsule (20 mg total) by mouth 2 (two) times daily before a meal.   oxyCODONE (OXY IR/ROXICODONE) 5 MG immediate release tablet Take 1 tablet (5 mg total) by mouth every 4 (four) hours as needed for moderate pain or severe pain.   oxyCODONE (OXYCONTIN) 10 mg 12 hr tablet Take 1 tablet (10 mg total) by mouth every 12 (twelve) hours for 7 days.   PARoxetine (PAXIL) 20 MG tablet Take 1 tablet (20 mg total) by mouth daily before supper.   polyethylene glycol (MIRALAX / GLYCOLAX) 17 g packet Take 17 g by mouth daily.   Simethicone (GAS-X PO) Take 1-2 tablets by mouth daily as needed (gas).   simvastatin (ZOCOR) 20 MG tablet TAKE ONE TABLET BY MOUTH AT BEDTIME   traZODone (DESYREL) 100 MG tablet Take 1 tablet (100 mg total) by mouth at bedtime.   No facility-administered encounter medications on file as of 07/26/2018.     PHYSICAL EXAM:   General: NAD, frail appearing, thin female Cardiovascular: regular rate and rhythm Pulmonary: clear ant fields Abdomen: soft, nontender, + bowel sounds GU: no suprapubic tenderness Extremities: no edema, no joint deformities Skin:  no rashes Neurological: Weakness but otherwise nonfocal   Marchella Hibbard Ihor Gully, NP

## 2018-07-31 ENCOUNTER — Ambulatory Visit: Payer: Federal, State, Local not specified - PPO | Admitting: Psychiatry

## 2018-08-03 ENCOUNTER — Encounter: Payer: Self-pay | Admitting: Gastroenterology

## 2018-08-04 ENCOUNTER — Encounter: Payer: Self-pay | Admitting: Gastroenterology

## 2018-08-14 ENCOUNTER — Other Ambulatory Visit: Payer: Self-pay

## 2018-08-14 ENCOUNTER — Ambulatory Visit: Payer: Federal, State, Local not specified - PPO | Admitting: Psychiatry

## 2018-08-21 ENCOUNTER — Telehealth: Payer: Self-pay | Admitting: Family Medicine

## 2018-08-21 NOTE — Telephone Encounter (Signed)
Home Health Verbal Orders - Caller/Agency: Misty with [facility] Callback Number: [phone], OK to leave a message Requesting OT/PT/Skilled Nursing/Social Work/Speech Therapy: nursing Frequency: 1 week 1, 2 week 2, 1 week 4, 1 every other week 2

## 2018-08-22 ENCOUNTER — Telehealth: Payer: Self-pay | Admitting: *Deleted

## 2018-08-22 NOTE — Telephone Encounter (Signed)
Called and left Misty a VM letting her know Dr. Jeananne Rama gave the ok for orders.

## 2018-08-22 NOTE — Telephone Encounter (Signed)
Call pt ok

## 2018-08-22 NOTE — Telephone Encounter (Signed)
Message left for patient to call the office.   Patient was a no show for CT scan last month. We need to see if she would like to reschedule CT scan and follow up appointment with Dr. Dahlia Byes.

## 2018-08-24 ENCOUNTER — Encounter: Payer: Self-pay | Admitting: *Deleted

## 2018-08-25 MED ORDER — ESOMEPRAZOLE MAGNESIUM 20 MG PO PACK
20.00 | PACK | ORAL | Status: DC
Start: 2018-08-26 — End: 2018-08-25

## 2018-08-25 MED ORDER — ASPIRIN 81 MG PO CHEW
81.00 | CHEWABLE_TABLET | ORAL | Status: DC
Start: 2018-08-26 — End: 2018-08-25

## 2018-08-25 MED ORDER — ACETAMINOPHEN 325 MG PO TABS
650.00 | ORAL_TABLET | ORAL | Status: DC
Start: ? — End: 2018-08-25

## 2018-08-25 MED ORDER — BUSPIRONE HCL 10 MG PO TABS
10.00 | ORAL_TABLET | ORAL | Status: DC
Start: 2018-08-25 — End: 2018-08-25

## 2018-08-25 MED ORDER — HEPARIN SODIUM (PORCINE) 5000 UNIT/ML IJ SOLN
5000.00 | INTRAMUSCULAR | Status: DC
Start: 2018-08-25 — End: 2018-08-25

## 2018-08-25 MED ORDER — LACTATED RINGERS IV SOLN
10.00 | INTRAVENOUS | Status: DC
Start: ? — End: 2018-08-25

## 2018-08-25 MED ORDER — OXYCODONE HCL 5 MG PO TABS
5.00 | ORAL_TABLET | ORAL | Status: DC
Start: ? — End: 2018-08-25

## 2018-08-25 MED ORDER — ONDANSETRON 4 MG PO TBDP
4.00 | ORAL_TABLET | ORAL | Status: DC
Start: ? — End: 2018-08-25

## 2018-08-25 MED ORDER — POLYETHYLENE GLYCOL 3350 17 G PO PACK
17.00 | PACK | ORAL | Status: DC
Start: 2018-08-26 — End: 2018-08-25

## 2018-08-25 MED ORDER — PRAVASTATIN SODIUM 40 MG PO TABS
40.00 | ORAL_TABLET | ORAL | Status: DC
Start: 2018-08-25 — End: 2018-08-25

## 2018-08-25 MED ORDER — GABAPENTIN 100 MG PO CAPS
100.00 | ORAL_CAPSULE | ORAL | Status: DC
Start: 2018-08-25 — End: 2018-08-25

## 2018-08-25 MED ORDER — TRAZODONE HCL 100 MG PO TABS
100.00 | ORAL_TABLET | ORAL | Status: DC
Start: 2018-08-25 — End: 2018-08-25

## 2018-08-25 MED ORDER — PAROXETINE HCL 20 MG PO TABS
20.00 | ORAL_TABLET | ORAL | Status: DC
Start: 2018-08-26 — End: 2018-08-25

## 2018-08-28 ENCOUNTER — Telehealth: Payer: Self-pay | Admitting: Family Medicine

## 2018-08-28 NOTE — Telephone Encounter (Signed)
Called and left a vm giving verbal orders

## 2018-08-28 NOTE — Telephone Encounter (Signed)
OK to give verbal.

## 2018-08-28 NOTE — Telephone Encounter (Signed)
Caller name: Mendel Ryder  Relation to pt: RN from [facility]  Call back number: [phone]    Reason for call:  Requesting verbal orders for resumption of care ordes and medical social worker orders

## 2018-08-29 ENCOUNTER — Telehealth: Payer: Self-pay | Admitting: Family Medicine

## 2018-08-29 NOTE — Telephone Encounter (Signed)
Chelsey [name] with [employer].  Pt is being discharged from [facility] and they need to know if they can follow pt in home with [place] services. Langley Gauss can be reached at [phone], option 2.

## 2018-08-29 NOTE — Telephone Encounter (Signed)
Verbal order given  

## 2018-08-29 NOTE — Telephone Encounter (Signed)
OK to give verbal.

## 2018-09-06 ENCOUNTER — Telehealth: Payer: Self-pay | Admitting: Primary Care

## 2018-09-06 NOTE — Telephone Encounter (Signed)
Called patient to make home palliative care appointment. She did not remember being seen by palliative at [facility]. She was dismissive and told me she did not need me to come and to "just forget it".  D/c from palliative.

## 2019-01-31 DIAGNOSIS — E785 Hyperlipidemia, unspecified: Secondary | ICD-10-CM | POA: Diagnosis not present

## 2019-01-31 DIAGNOSIS — K5900 Constipation, unspecified: Secondary | ICD-10-CM | POA: Diagnosis not present

## 2019-01-31 DIAGNOSIS — K219 Gastro-esophageal reflux disease without esophagitis: Secondary | ICD-10-CM | POA: Diagnosis not present

## 2019-01-31 DIAGNOSIS — Z79899 Other long term (current) drug therapy: Secondary | ICD-10-CM | POA: Diagnosis not present

## 2019-01-31 DIAGNOSIS — F17200 Nicotine dependence, unspecified, uncomplicated: Secondary | ICD-10-CM | POA: Diagnosis not present

## 2019-05-25 ENCOUNTER — Other Ambulatory Visit: Payer: Self-pay | Admitting: Psychiatry

## 2019-07-10 ENCOUNTER — Telehealth: Payer: Self-pay

## 2019-07-10 NOTE — Telephone Encounter (Signed)
pt called states she needed a refill on her trazodone. pt was told that she was last seen by dr. Gretel Acre on  05-29-2018. i ask pt who has been giving her the medication because the rx that dr. Gretel Acre wrote would have run out in feb 2021. who have been giving it too her. She stated that she was at [facility] and they had given her some.   Pt was advised to see if her pcp would send a referral over to [place] because it has been longer than 6 months and also she can go to [facility], [facility] or [facility] to get enough medication until she can be set up with an appt.

## 2019-07-12 ENCOUNTER — Ambulatory Visit (INDEPENDENT_AMBULATORY_CARE_PROVIDER_SITE_OTHER): Payer: Medicare Other | Admitting: Family Medicine

## 2019-07-12 ENCOUNTER — Other Ambulatory Visit: Payer: Self-pay

## 2019-07-12 ENCOUNTER — Encounter: Payer: Self-pay | Admitting: Family Medicine

## 2019-07-12 VITALS — BP 89/67 | HR 78 | Temp 97.9°F | Wt 126.0 lb

## 2019-07-12 DIAGNOSIS — K5909 Other constipation: Secondary | ICD-10-CM

## 2019-07-12 DIAGNOSIS — F5101 Primary insomnia: Secondary | ICD-10-CM

## 2019-07-12 DIAGNOSIS — F331 Major depressive disorder, recurrent, moderate: Secondary | ICD-10-CM

## 2019-07-12 MED ORDER — PAROXETINE HCL 20 MG PO TABS: 20.0000 mg | ORAL_TABLET | Freq: Every day | ORAL | 1 refills | Status: DC

## 2019-07-12 MED ORDER — TRAZODONE HCL 100 MG PO TABS: 100.0000 mg | ORAL_TABLET | Freq: Every day | ORAL | 1 refills | Status: DC

## 2019-07-12 MED ORDER — LINACLOTIDE 72 MCG PO CAPS: 72.0000 ug | ORAL_CAPSULE | Freq: Every day | ORAL | 2 refills | Status: DC

## 2019-07-12 NOTE — Progress Notes (Signed)
BP (!) 89/67   Pulse 78   Temp 97.9 F (36.6 C) (Oral)   Wt 126 lb (57.2 kg)   LMP  (LMP Unknown)   SpO2 98%   BMI 20.97 kg/m    Subjective:    Patient ID: Chelsey [name], female    DOB: 19-Dec-1950, 69 y.o.   MRN: [id number]  HPI: Chelsey [name] is a 68 y.o. female  Chief Complaint  Patient presents with  . Insomnia  . Depression  . Medication Refill    paxil and trazodone  . Abdominal Pain    ongoing for years  . Constipation   Has been followed by Psychiatry for several years but Psychiatrist left. Needing trazodone and paxil refilled, also on buspar BID but good on that for now. Feels medications are controlling sxs well. Denies mood swings, panic episodes, sleep issues, SI/HI.   Has been dealing for years with abdominal pain and constipation. Seeing GI at [facility] and has had extensive imaging and testing over the past year or so, including upper and lower endoscopies all which have been unrevealing. Having minimal BMs, maybe every other week despite numerous OTC medications. Tried linzess in the past but notes this gave her diarrhea. Denies bloody stools, unintended weight loss, fevers, night sweats, diet changes. Does not eat much because this tends to make the abdominal pain worse from stool burden.   Relevant past medical, surgical, family and social history reviewed and updated as indicated. Interim medical history since our last visit reviewed. Allergies and medications reviewed and updated.  Review of Systems  Per HPI unless specifically indicated above     Objective:    BP (!) 89/67   Pulse 78   Temp 97.9 F (36.6 C) (Oral)   Wt 126 lb (57.2 kg)   LMP  (LMP Unknown)   SpO2 98%   BMI 20.97 kg/m   Wt Readings from Last 3 Encounters:  07/12/19 126 lb (57.2 kg)  07/26/18 135 lb 9.6 oz (61.5 kg)  07/16/18 111 lb 15.9 oz (50.8 kg)    Physical Exam Vitals and nursing note reviewed.  Constitutional:      Appearance: Normal appearance. She is not  ill-appearing.  HENT:     Head: Atraumatic.  Eyes:     Extraocular Movements: Extraocular movements intact.     Conjunctiva/sclera: Conjunctivae normal.  Cardiovascular:     Rate and Rhythm: Normal rate and regular rhythm.     Heart sounds: Normal heart sounds.  Pulmonary:     Effort: Pulmonary effort is normal.     Breath sounds: Normal breath sounds.  Abdominal:     General: Bowel sounds are normal. There is no distension.     Palpations: Abdomen is soft.     Tenderness: There is no abdominal tenderness. There is no right CVA tenderness, left CVA tenderness or guarding.  Musculoskeletal:        General: Normal range of motion.     Cervical back: Normal range of motion and neck supple.  Skin:    General: Skin is warm and dry.  Neurological:     Mental Status: She is alert and oriented to person, place, and time.  Psychiatric:        Mood and Affect: Mood normal.        Thought Content: Thought content normal.        Judgment: Judgment normal.     Results for orders placed or performed during the hospital encounter of 07/14/18  Blood Culture (routine x 2)   Specimen: BLOOD  Result Value Ref Range   Specimen Description BLOOD LAC    Special Requests      BOTTLES DRAWN AEROBIC AND ANAEROBIC Blood Culture adequate volume   Culture      NO GROWTH 5 DAYS Performed at [facility], [address] [place], [place] [address]    Report Status 07/19/2018 FINAL   Blood Culture (routine x 2)   Specimen: BLOOD  Result Value Ref Range   Specimen Description BLOOD RAC    Special Requests      BOTTLES DRAWN AEROBIC AND ANAEROBIC Blood Culture adequate volume   Culture      NO GROWTH 5 DAYS Performed at [facility], [address] [place], [place] [address]    Report Status 07/19/2018 FINAL   SARS Coronavirus 2 (CEPHEID- Performed in [place] hospital lab), [facility] Order   Specimen: Nasopharyngeal Swab  Result Value Ref Range   SARS Coronavirus 2 NEGATIVE  NEGATIVE  SARS Coronavirus 2 (CEPHEID- Performed in [place] hospital lab), [facility] Order   Specimen: Nasopharyngeal Swab  Result Value Ref Range   SARS Coronavirus 2 NEGATIVE NEGATIVE  CBC  Result Value Ref Range   WBC 20.4 (H) 4.0 - 10.5 K/uL   RBC 4.09 3.87 - 5.11 MIL/uL   Hemoglobin 11.4 (L) 12.0 - 15.0 g/dL   HCT 35.4 (L) 36 - 46 %   MCV 86.6 80.0 - 100.0 fL   MCH 27.9 26.0 - 34.0 pg   MCHC 32.2 30.0 - 36.0 g/dL   RDW 13.2 11.5 - 15.5 %   Platelets 457 (H) 150 - 400 K/uL   nRBC 0.0 0.0 - 0.2 %  Comprehensive metabolic panel  Result Value Ref Range   Sodium 129 (L) 135 - 145 mmol/L   Potassium 4.4 3.5 - 5.1 mmol/L   Chloride 90 (L) 98 - 111 mmol/L   CO2 27 22 - 32 mmol/L   Glucose, Bld 121 (H) 70 - 99 mg/dL   BUN 14 8 - 23 mg/dL   Creatinine, Ser 0.68 0.44 - 1.00 mg/dL   Calcium 8.8 (L) 8.9 - 10.3 mg/dL   Total Protein 6.8 6.5 - 8.1 g/dL   Albumin 2.8 (L) 3.5 - 5.0 g/dL   AST 32 15 - 41 U/L   ALT 49 (H) 0 - 44 U/L   Alkaline Phosphatase 215 (H) 38 - 126 U/L   Total Bilirubin 1.1 0.3 - 1.2 mg/dL   GFR calc non Af Amer >60 >60 mL/min   GFR calc Af Amer >60 >60 mL/min   Anion gap 12 5 - 15  Lactic acid, plasma  Result Value Ref Range   Lactic Acid, Venous 1.3 0.5 - 1.9 mmol/L  Urinalysis, Complete w Microscopic  Result Value Ref Range   Color, Urine STRAW (A) YELLOW   APPearance CLEAR (A) CLEAR   Specific Gravity, Urine 1.005 1.005 - 1.030   pH 6.0 5.0 - 8.0   Glucose, UA NEGATIVE NEGATIVE mg/dL   Hgb urine dipstick NEGATIVE NEGATIVE   Bilirubin Urine NEGATIVE NEGATIVE   Ketones, ur NEGATIVE NEGATIVE mg/dL   Protein, ur NEGATIVE NEGATIVE mg/dL   Nitrite NEGATIVE NEGATIVE   Leukocytes,Ua NEGATIVE NEGATIVE   WBC, UA 0-5 0 - 5 WBC/hpf   Bacteria, UA FEW (A) NONE SEEN   Squamous Epithelial / LPF 0-5 0 - 5  Basic metabolic panel  Result Value Ref Range   Sodium 136 135 - 145  mmol/L   Potassium 3.8 3.5 - 5.1 mmol/L   Chloride 106 98 - 111 mmol/L   CO2 22 22 - 32  mmol/L   Glucose, Bld 91 70 - 99 mg/dL   BUN 10 8 - 23 mg/dL   Creatinine, Ser 0.41 (L) 0.44 - 1.00 mg/dL   Calcium 7.9 (L) 8.9 - 10.3 mg/dL   GFR calc non Af Amer >60 >60 mL/min   GFR calc Af Amer >60 >60 mL/min   Anion gap 8 5 - 15  CBC  Result Value Ref Range   WBC 10.3 4.0 - 10.5 K/uL   RBC 3.43 (L) 3.87 - 5.11 MIL/uL   Hemoglobin 9.8 (L) 12.0 - 15.0 g/dL   HCT 31.1 (L) 36 - 46 %   MCV 90.7 80.0 - 100.0 fL   MCH 28.6 26.0 - 34.0 pg   MCHC 31.5 30.0 - 36.0 g/dL   RDW 13.3 11.5 - 15.5 %   Platelets 369 150 - 400 K/uL   nRBC 0.0 0.0 - 0.2 %  Procalcitonin  Result Value Ref Range   Procalcitonin 0.18 ng/mL  Protime-INR  Result Value Ref Range   Prothrombin Time 14.6 11.4 - 15.2 seconds   INR 1.2 0.8 - 1.2  APTT  Result Value Ref Range   aPTT 34 24 - 36 seconds  CBC  Result Value Ref Range   WBC 8.3 4.0 - 10.5 K/uL   RBC 3.44 (L) 3.87 - 5.11 MIL/uL   Hemoglobin 9.8 (L) 12.0 - 15.0 g/dL   HCT 30.7 (L) 36 - 46 %   MCV 89.2 80.0 - 100.0 fL   MCH 28.5 26.0 - 34.0 pg   MCHC 31.9 30.0 - 36.0 g/dL   RDW 13.2 11.5 - 15.5 %   Platelets 421 (H) 150 - 400 K/uL   nRBC 0.0 0.0 - 0.2 %  Basic metabolic panel  Result Value Ref Range   Sodium 134 (L) 135 - 145 mmol/L   Potassium 3.2 (L) 3.5 - 5.1 mmol/L   Chloride 104 98 - 111 mmol/L   CO2 23 22 - 32 mmol/L   Glucose, Bld 95 70 - 99 mg/dL   BUN 6 (L) 8 - 23 mg/dL   Creatinine, Ser 0.45 0.44 - 1.00 mg/dL   Calcium 7.8 (L) 8.9 - 10.3 mg/dL   GFR calc non Af Amer >60 >60 mL/min   GFR calc Af Amer >60 >60 mL/min   Anion gap 7 5 - 15  CBC  Result Value Ref Range   WBC 7.9 4.0 - 10.5 K/uL   RBC 3.93 3.87 - 5.11 MIL/uL   Hemoglobin 11.2 (L) 12.0 - 15.0 g/dL   HCT 34.7 (L) 36 - 46 %   MCV 88.3 80.0 - 100.0 fL   MCH 28.5 26.0 - 34.0 pg   MCHC 32.3 30.0 - 36.0 g/dL   RDW 13.1 11.5 - 15.5 %   Platelets 496 (H) 150 - 400 K/uL   nRBC 0.0 0.0 - 0.2 %  Basic metabolic panel  Result Value Ref Range   Sodium 136 135 - 145 mmol/L    Potassium 3.4 (L) 3.5 - 5.1 mmol/L   Chloride 106 98 - 111 mmol/L   CO2 23 22 - 32 mmol/L   Glucose, Bld 92 70 - 99 mg/dL   BUN 7 (L) 8 - 23 mg/dL   Creatinine, Ser 0.48 0.44 - 1.00 mg/dL   Calcium 7.9 (L) 8.9 - 10.3 mg/dL   GFR calc non  Af Amer >60 >60 mL/min   GFR calc Af Amer >60 >60 mL/min   Anion gap 7 5 - 15  Phosphorus  Result Value Ref Range   Phosphorus 3.1 2.5 - 4.6 mg/dL  Magnesium  Result Value Ref Range   Magnesium 2.1 1.7 - 2.4 mg/dL  CBC  Result Value Ref Range   WBC 8.4 4.0 - 10.5 K/uL   RBC 3.93 3.87 - 5.11 MIL/uL   Hemoglobin 10.9 (L) 12.0 - 15.0 g/dL   HCT 34.0 (L) 36 - 46 %   MCV 86.5 80.0 - 100.0 fL   MCH 27.7 26.0 - 34.0 pg   MCHC 32.1 30.0 - 36.0 g/dL   RDW 13.0 11.5 - 15.5 %   Platelets 504 (H) 150 - 400 K/uL   nRBC 0.0 0.0 - 0.2 %  Comprehensive metabolic panel  Result Value Ref Range   Sodium 133 (L) 135 - 145 mmol/L   Potassium 3.4 (L) 3.5 - 5.1 mmol/L   Chloride 99 98 - 111 mmol/L   CO2 26 22 - 32 mmol/L   Glucose, Bld 107 (H) 70 - 99 mg/dL   BUN 6 (L) 8 - 23 mg/dL   Creatinine, Ser 0.42 (L) 0.44 - 1.00 mg/dL   Calcium 7.7 (L) 8.9 - 10.3 mg/dL   Total Protein 5.3 (L) 6.5 - 8.1 g/dL   Albumin 2.2 (L) 3.5 - 5.0 g/dL   AST 19 15 - 41 U/L   ALT 20 0 - 44 U/L   Alkaline Phosphatase 107 38 - 126 U/L   Total Bilirubin 0.5 0.3 - 1.2 mg/dL   GFR calc non Af Amer >60 >60 mL/min   GFR calc Af Amer >60 >60 mL/min   Anion gap 8 5 - 15  CBC  Result Value Ref Range   WBC 7.0 4.0 - 10.5 K/uL   RBC 3.63 (L) 3.87 - 5.11 MIL/uL   Hemoglobin 10.2 (L) 12.0 - 15.0 g/dL   HCT 31.9 (L) 36 - 46 %   MCV 87.9 80.0 - 100.0 fL   MCH 28.1 26.0 - 34.0 pg   MCHC 32.0 30.0 - 36.0 g/dL   RDW 13.1 11.5 - 15.5 %   Platelets 469 (H) 150 - 400 K/uL   nRBC 0.0 0.0 - 0.2 %  Basic metabolic panel  Result Value Ref Range   Sodium 136 135 - 145 mmol/L   Potassium 3.2 (L) 3.5 - 5.1 mmol/L   Chloride 105 98 - 111 mmol/L   CO2 26 22 - 32 mmol/L   Glucose, Bld 98 70 - 99  mg/dL   BUN <5 (L) 8 - 23 mg/dL   Creatinine, Ser 0.37 (L) 0.44 - 1.00 mg/dL   Calcium 7.9 (L) 8.9 - 10.3 mg/dL   GFR calc non Af Amer >60 >60 mL/min   GFR calc Af Amer >60 >60 mL/min   Anion gap 5 5 - 15      Assessment & Plan:   Problem List Items Addressed This Visit      Digestive   Chronic constipation    Restart linzess at every other day and build to every day if tolerated. F/u with [facility] for further management if this does not resolve frequency concerns        Other   Depression, major, recurrent, moderate ([place])    Stable and under good control, previously managed by Psychiatry. I will take over her med mgmt provided she remains stable on regimen. Pt agreeable to  this, refills sent      Relevant Medications   traZODone (DESYREL) 100 MG tablet   PARoxetine (PAXIL) 20 MG tablet   Insomnia - Primary    Stable and well controlled, continue current regimen          Follow up plan: Return in about 6 months (around 01/11/2020) for CPE.

## 2019-07-18 DIAGNOSIS — K5909 Other constipation: Secondary | ICD-10-CM | POA: Insufficient documentation

## 2019-07-18 NOTE — Assessment & Plan Note (Signed)
Stable and under good control, previously managed by Psychiatry. I will take over her med mgmt provided she remains stable on regimen. Pt agreeable to this, refills sent

## 2019-07-18 NOTE — Assessment & Plan Note (Signed)
Restart linzess at every other day and build to every day if tolerated. F/u with [facility] for further management if this does not resolve frequency concerns

## 2019-07-18 NOTE — Assessment & Plan Note (Signed)
Stable and well controlled, continue current regimen 

## 2019-08-27 IMAGING — CT CT SHOULDER*L* W/O CM
3 of 5 series · 15 of 36 positions shown, 17 images · non-contrast
Comparison: None.

CLINICAL DATA: Status post fall.  Left shoulder pain.

EXAM:
CT OF THE UPPER LEFT EXTREMITY WITHOUT CONTRAST
TECHNIQUE: Multidetector CT imaging of the upper left extremity was performed
according to the standard protocol.

[Series 6: cor bone · coronal · 0.35mm/px · 1 of 79 slices shown]
[im 40/79  bone]
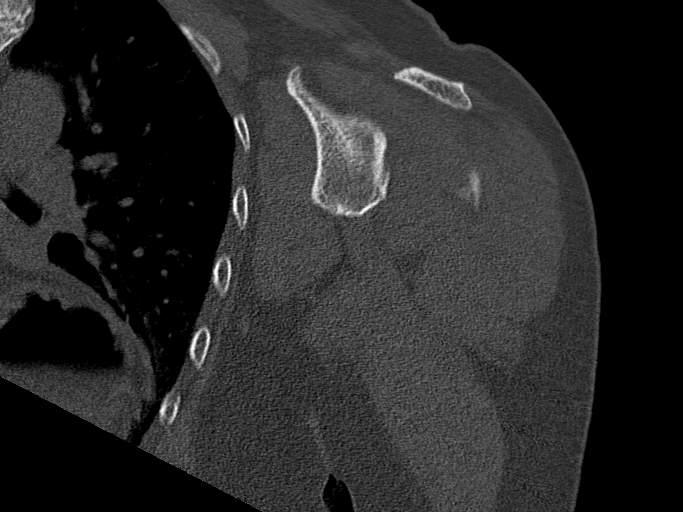

[Series 7: sag bone · sagittal · 0.38mm/px · 6 of 63 slices shown]
[im 11/63  bone]
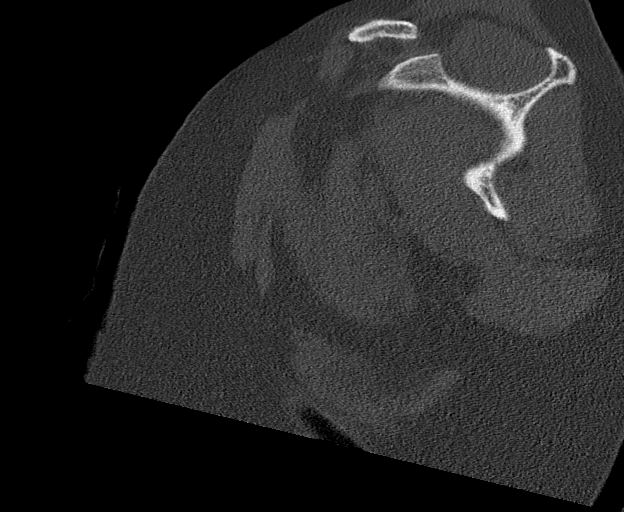
[im 21/63  bone]
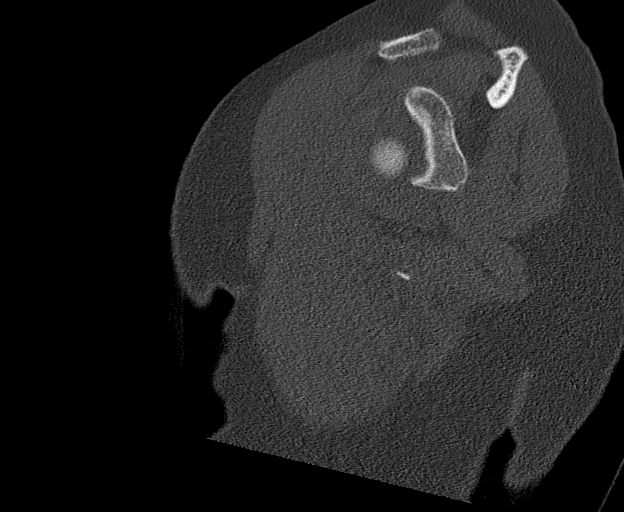
[im 26/63  soft-tissue]
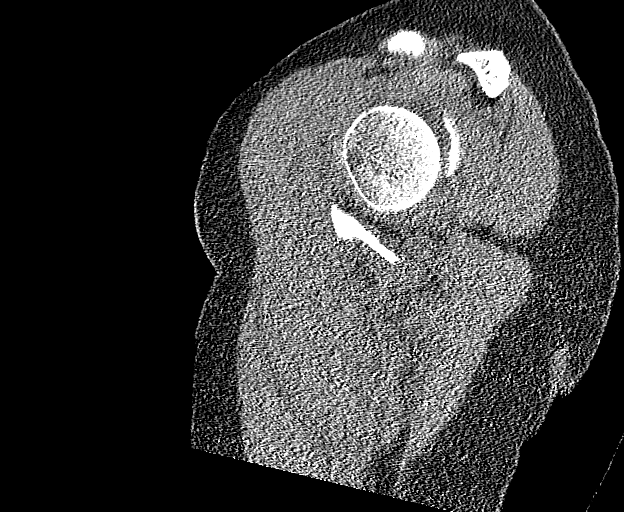
[im 32/63  bone]
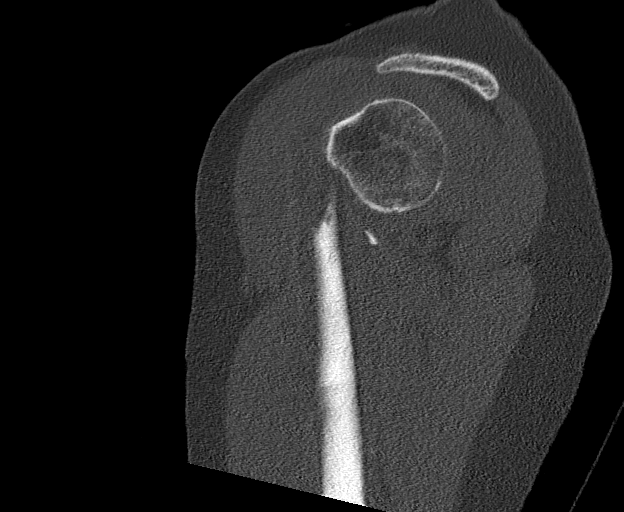
[im 42/63  bone]
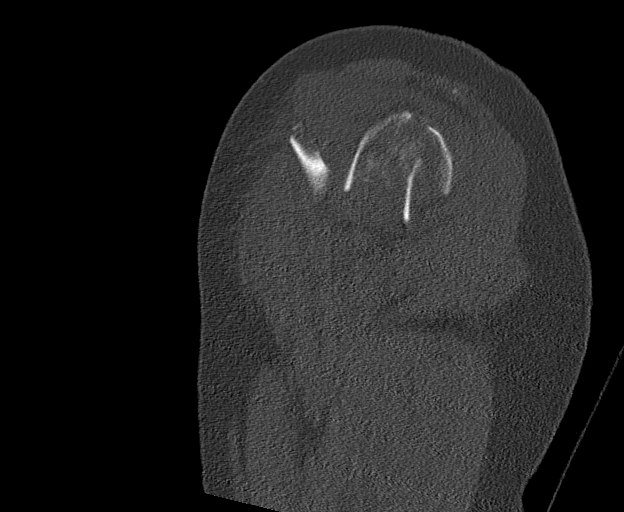
[im 52/63  bone]
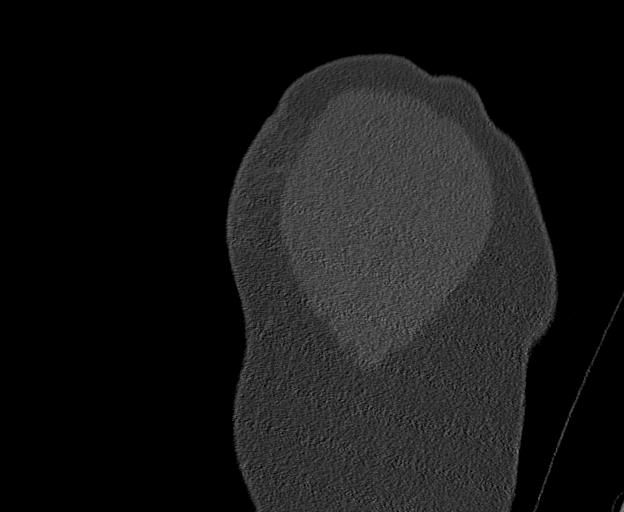

[Series 8: ax st · axial · 0.37mm/px · z∈[-363,-212]mm · 8 of 101 slices shown, 10 images]
[im 8/101  soft-tissue]
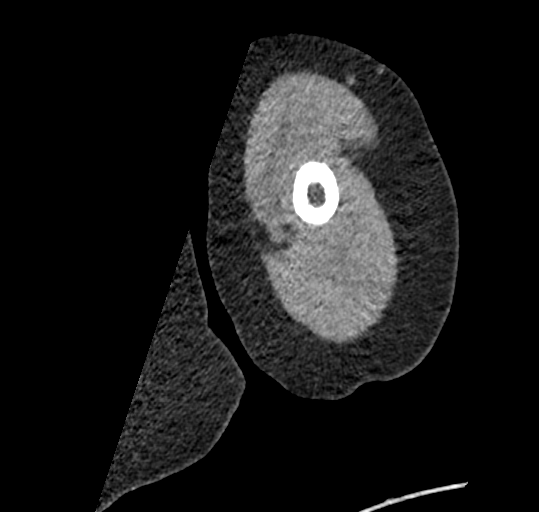
[im 8/101  bone]
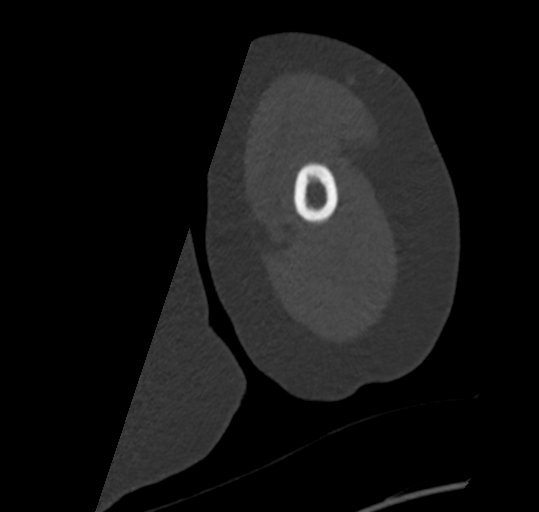
[im 24/101  bone]
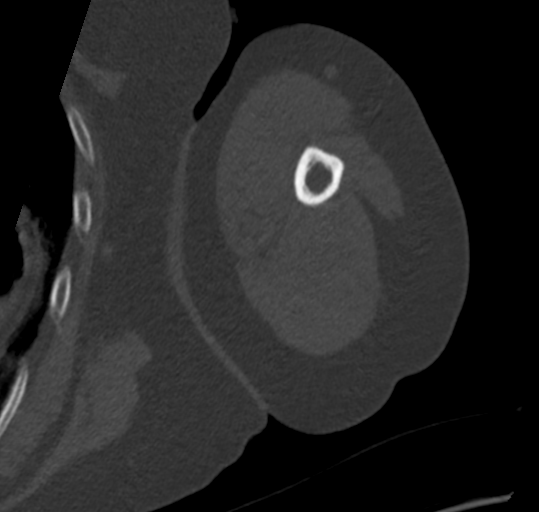
[im 31/101  bone]
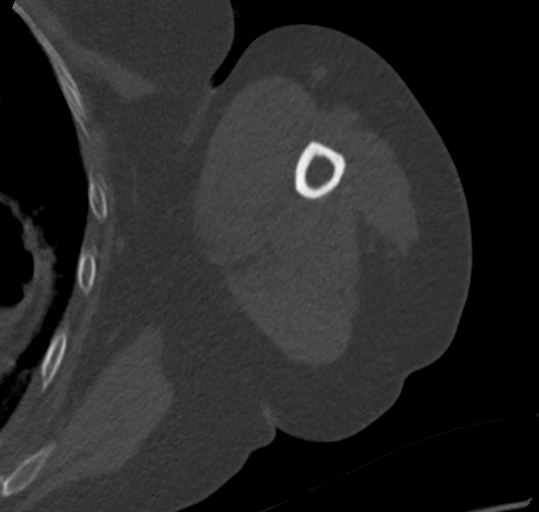
[im 47/101  bone]
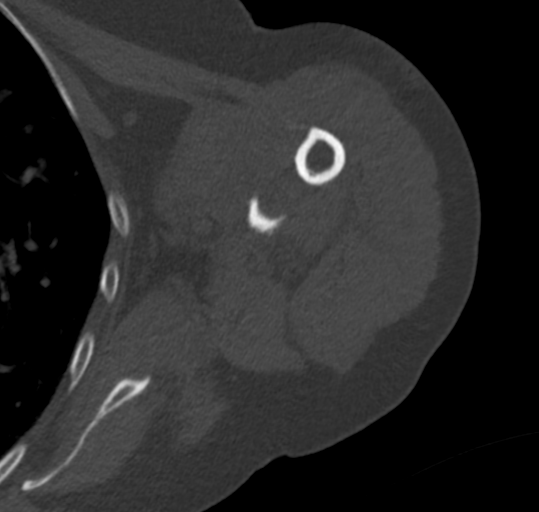
[im 54/101  soft-tissue]
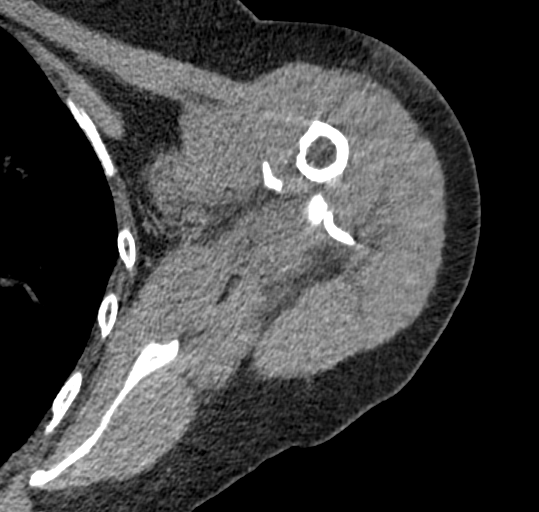
[im 54/101  bone]
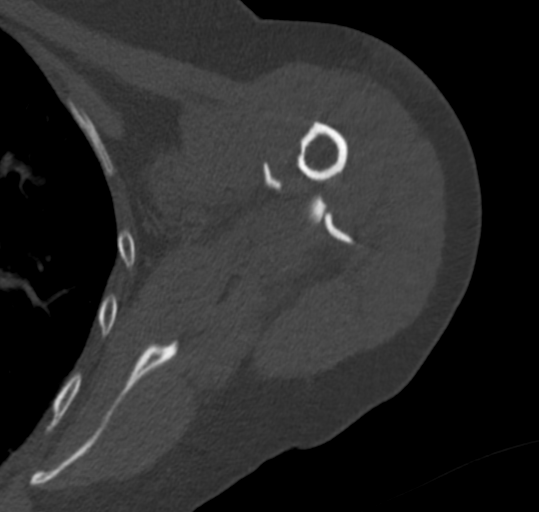
[im 70/101  bone]
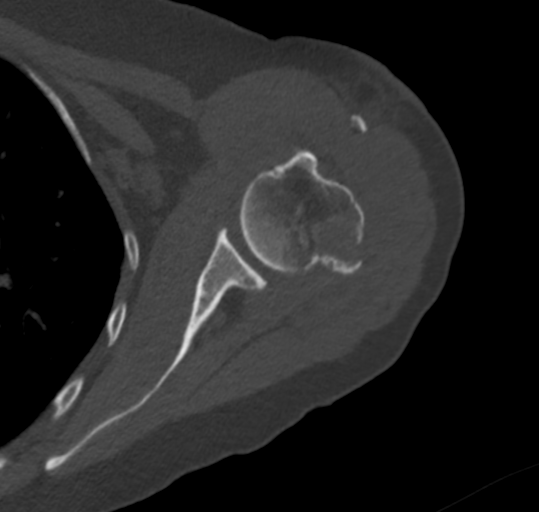
[im 77/101  bone]
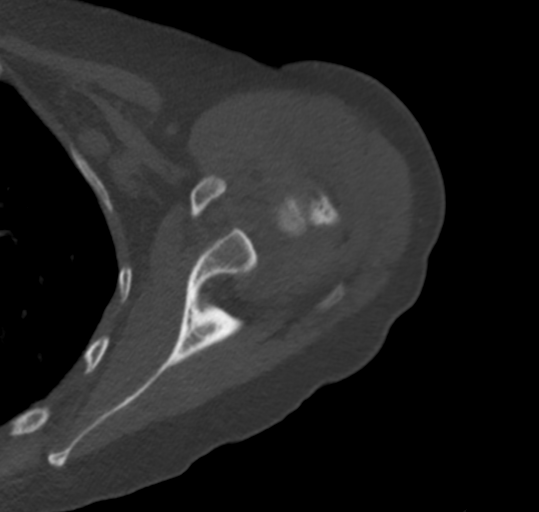
[im 93/101  bone]
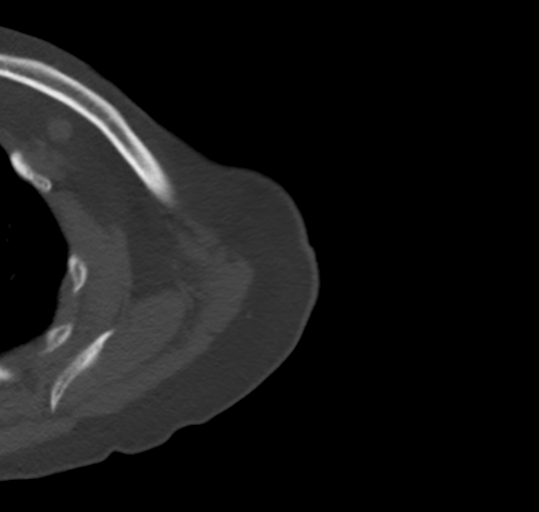

[15 of 36 positions shown; findings below may reference images not displayed]

FINDINGS: Bones/Joint/Cartilage

Comminuted fracture of the surgical neck of the left proximal
humerus with 2 cm of antro lateral displacement. 18 mm fracture
fragment is medially displaced by 15 mm. Fracture extends to the
greater tuberosity without significant displacement. 3 mm loose body
along the superior glenoid.

No aggressive lytic or sclerotic osseous lesion. No other fracture
or dislocation. Mild arthropathy of the acromioclavicular joint.
Type II acromion.

Ligaments

Ligaments are suboptimally evaluated by CT.

Muscles and Tendons
Muscles are normal.  No muscle atrophy.

Soft tissue
No fluid collection or hematoma. No soft tissue mass. Visualized
left lung is clear. Large hiatal hernia.
IMPRESSION: 1. Comminuted fracture of the surgical neck of the left proximal
humerus with 2 cm of antro lateral displacement. 18 mm fracture
fragment is medially displaced by 15 mm. Fracture extends to the
greater tuberosity without significant displacement.

## 2019-08-27 IMAGING — CR DG SHOULDER 2+V*L*
1 series · 3 of 3 positions shown · non-contrast
Comparison: None.

CLINICAL DATA: Fall today.  Left shoulder pain.  Initial encounter.

EXAM:
LEFT SHOULDER - 2+ VIEW

[Series 1: dg shoulder left · 0.14mm/px · 3 of 3 slices shown]
[im 1/3]
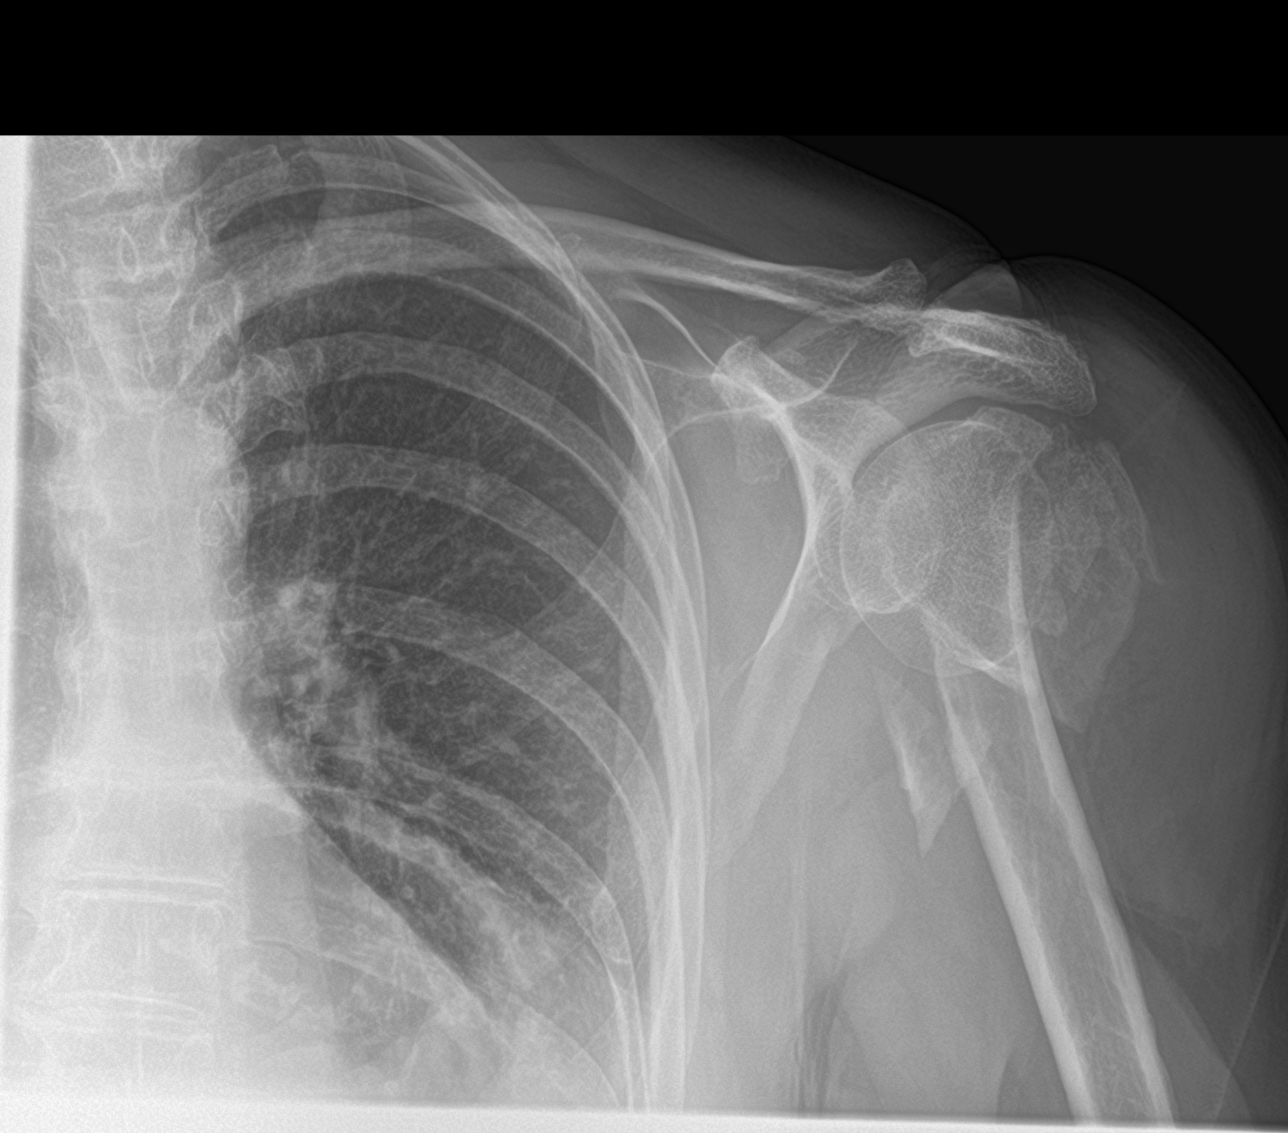
[im 2/3]
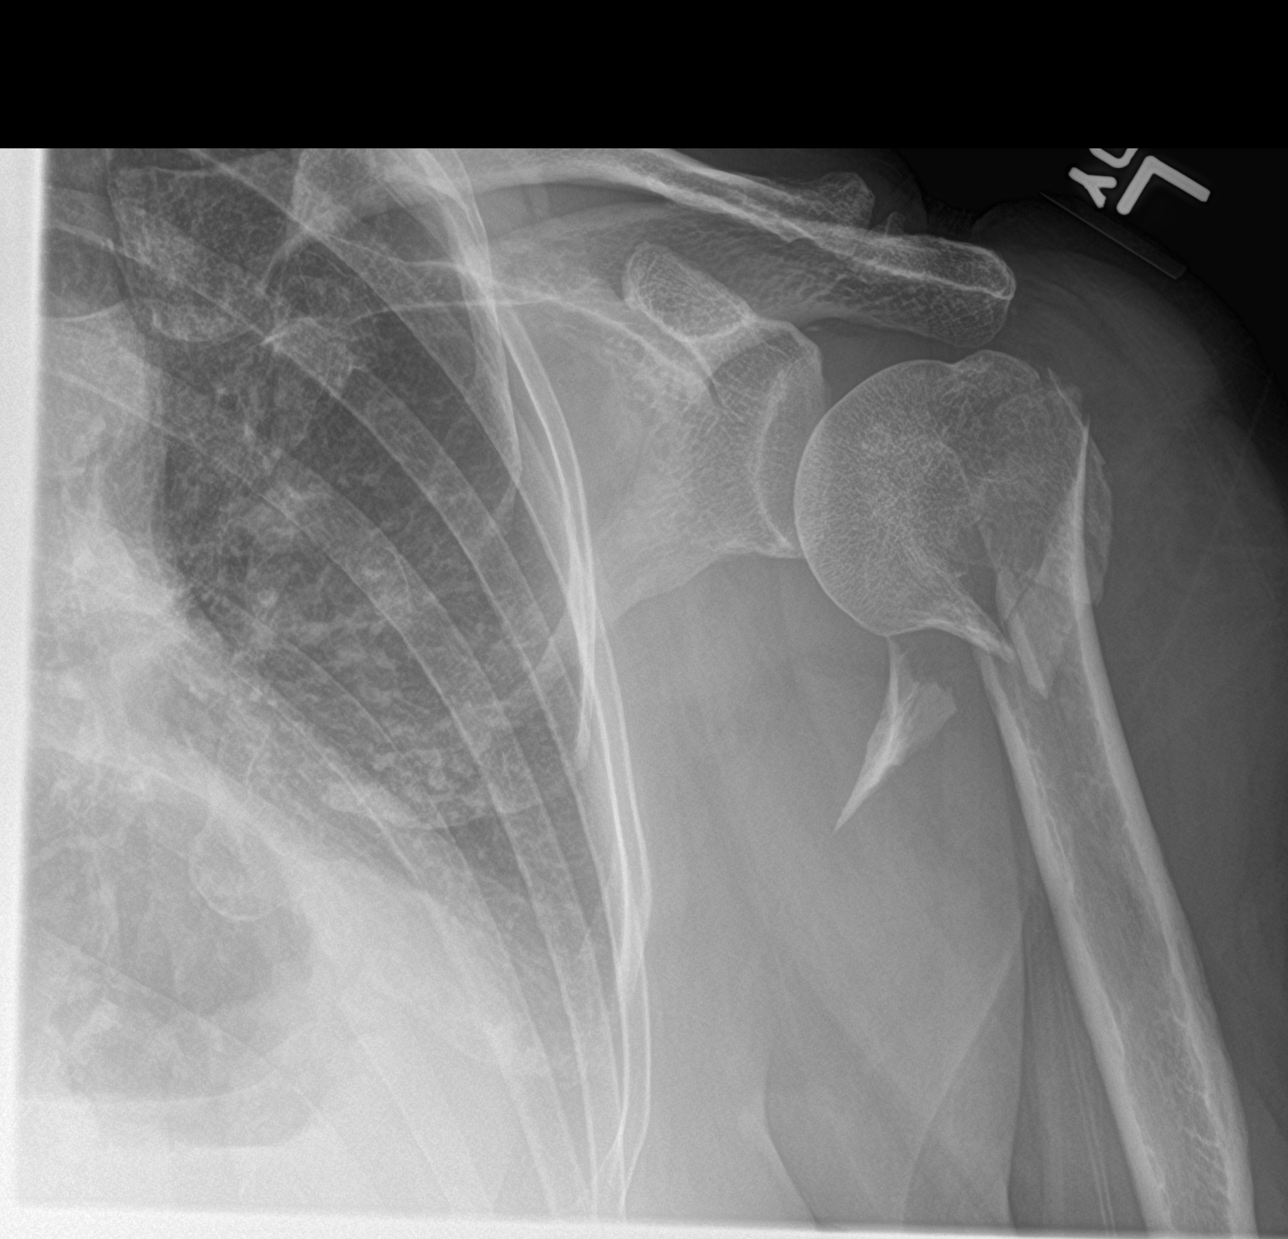
[im 3/3]
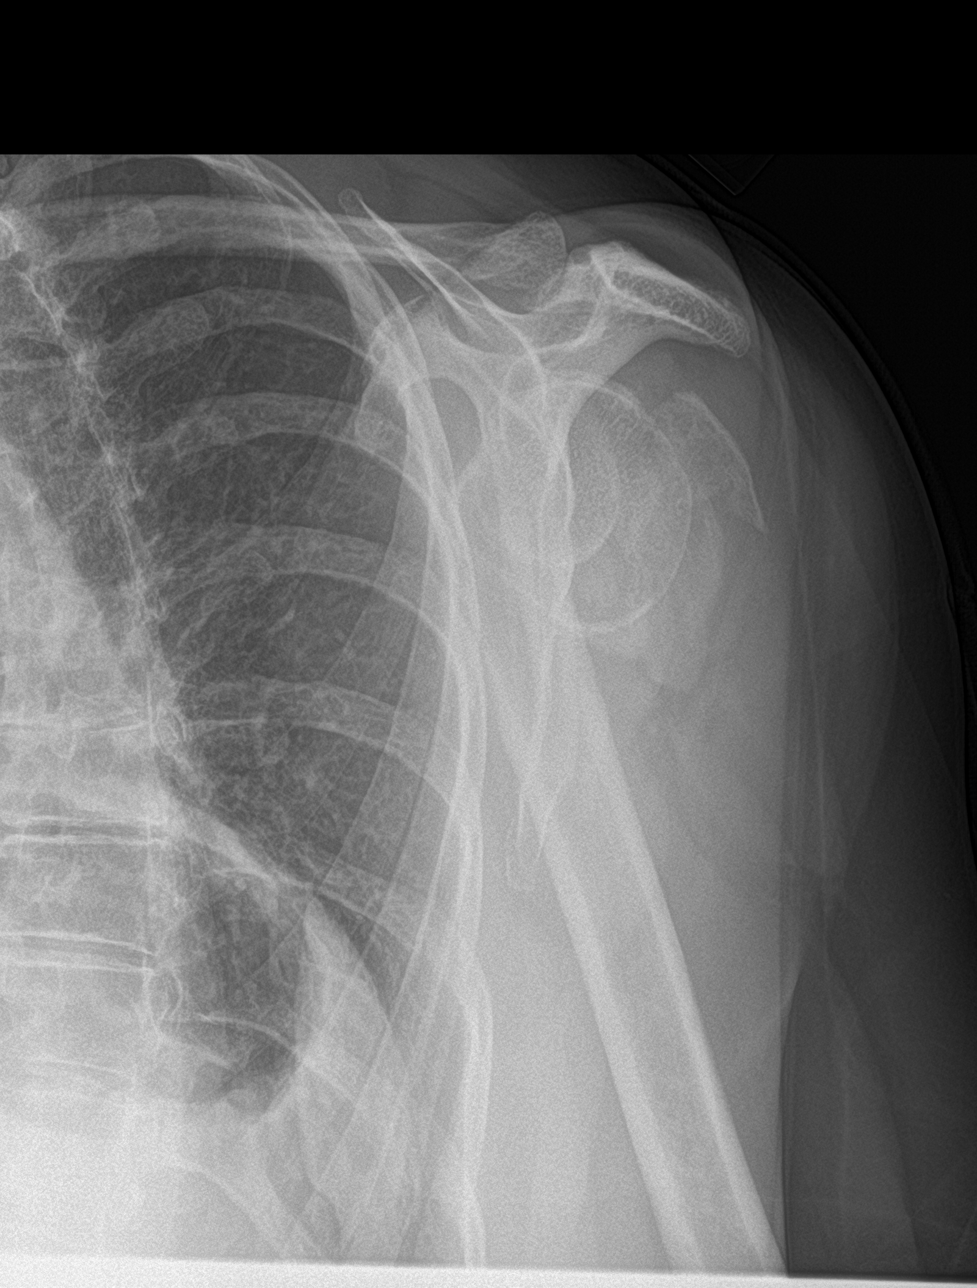

[3 of 3 positions shown; findings below may reference images not displayed]

FINDINGS: Highly comminuted fracture of the left humeral neck is seen. No
evidence of dislocation. No other fractures or bone lesions
identified.
IMPRESSION: Highly comminuted left humeral neck fracture.

## 2019-09-05 ENCOUNTER — Encounter: Payer: Self-pay | Admitting: Family Medicine

## 2019-09-24 ENCOUNTER — Encounter: Payer: Self-pay | Admitting: Emergency Medicine

## 2019-09-24 ENCOUNTER — Emergency Department (EMERGENCY_DEPARTMENT_HOSPITAL)
Admission: EM | Admit: 2019-09-24 | Discharge: 2019-09-25 | Disposition: A | Discharge status: Other Acute Inpatient Hospital | Payer: Medicare Other | Source: Home / Self Care | Attending: Emergency Medicine | Admitting: Emergency Medicine

## 2019-09-24 ENCOUNTER — Emergency Department: Payer: Medicare Other

## 2019-09-24 ENCOUNTER — Other Ambulatory Visit: Payer: Self-pay

## 2019-09-24 DIAGNOSIS — F332 Major depressive disorder, recurrent severe without psychotic features: Secondary | ICD-10-CM | POA: Diagnosis not present

## 2019-09-24 DIAGNOSIS — R45851 Suicidal ideations: Secondary | ICD-10-CM | POA: Insufficient documentation

## 2019-09-24 DIAGNOSIS — G8929 Other chronic pain: Secondary | ICD-10-CM | POA: Insufficient documentation

## 2019-09-24 DIAGNOSIS — K5900 Constipation, unspecified: Secondary | ICD-10-CM | POA: Insufficient documentation

## 2019-09-24 DIAGNOSIS — F329 Major depressive disorder, single episode, unspecified: Secondary | ICD-10-CM | POA: Insufficient documentation

## 2019-09-24 DIAGNOSIS — E785 Hyperlipidemia, unspecified: Secondary | ICD-10-CM | POA: Diagnosis not present

## 2019-09-24 DIAGNOSIS — M1712 Unilateral primary osteoarthritis, left knee: Secondary | ICD-10-CM | POA: Diagnosis not present

## 2019-09-24 DIAGNOSIS — Z9011 Acquired absence of right breast and nipple: Secondary | ICD-10-CM | POA: Insufficient documentation

## 2019-09-24 DIAGNOSIS — Z96612 Presence of left artificial shoulder joint: Secondary | ICD-10-CM | POA: Insufficient documentation

## 2019-09-24 DIAGNOSIS — R531 Weakness: Secondary | ICD-10-CM | POA: Diagnosis not present

## 2019-09-24 DIAGNOSIS — M25462 Effusion, left knee: Secondary | ICD-10-CM | POA: Diagnosis not present

## 2019-09-24 DIAGNOSIS — K838 Other specified diseases of biliary tract: Secondary | ICD-10-CM | POA: Diagnosis not present

## 2019-09-24 DIAGNOSIS — R1013 Epigastric pain: Secondary | ICD-10-CM | POA: Insufficient documentation

## 2019-09-24 DIAGNOSIS — F17210 Nicotine dependence, cigarettes, uncomplicated: Secondary | ICD-10-CM | POA: Insufficient documentation

## 2019-09-24 DIAGNOSIS — R109 Unspecified abdominal pain: Secondary | ICD-10-CM | POA: Diagnosis not present

## 2019-09-24 DIAGNOSIS — Z20822 Contact with and (suspected) exposure to covid-19: Secondary | ICD-10-CM | POA: Insufficient documentation

## 2019-09-24 DIAGNOSIS — K5730 Diverticulosis of large intestine without perforation or abscess without bleeding: Secondary | ICD-10-CM | POA: Diagnosis not present

## 2019-09-24 DIAGNOSIS — I700 Atherosclerosis of aorta: Secondary | ICD-10-CM | POA: Diagnosis not present

## 2019-09-24 LAB — CBC
HCT: 38.3 % (ref 36.0–46.0)
Hemoglobin: 13 g/dL (ref 12.0–15.0)
MCH: 31.6 pg (ref 26.0–34.0)
MCHC: 33.9 g/dL (ref 30.0–36.0)
MCV: 93 fL (ref 80.0–100.0)
Platelets: 205 10*3/uL (ref 150–400)
RBC: 4.12 MIL/uL (ref 3.87–5.11)
RDW: 12.5 % (ref 11.5–15.5)
WBC: 7.7 10*3/uL (ref 4.0–10.5)
nRBC: 0 % (ref 0.0–0.2)

## 2019-09-24 LAB — HEPATIC FUNCTION PANEL
ALT: 61 U/L — ABNORMAL HIGH (ref 0–44)
AST: 54 U/L — ABNORMAL HIGH (ref 15–41)
Albumin: 3.8 g/dL (ref 3.5–5.0)
Alkaline Phosphatase: 80 U/L (ref 38–126)
Bilirubin, Direct: 0.3 mg/dL — ABNORMAL HIGH (ref 0.0–0.2)
Indirect Bilirubin: 1.6 mg/dL — ABNORMAL HIGH (ref 0.3–0.9)
Total Bilirubin: 1.9 mg/dL — ABNORMAL HIGH (ref 0.3–1.2)
Total Protein: 6.8 g/dL (ref 6.5–8.1)

## 2019-09-24 LAB — BASIC METABOLIC PANEL
Anion gap: 11 (ref 5–15)
BUN: 13 mg/dL (ref 8–23)
CO2: 26 mmol/L (ref 22–32)
Calcium: 8.6 mg/dL — ABNORMAL LOW (ref 8.9–10.3)
Chloride: 95 mmol/L — ABNORMAL LOW (ref 98–111)
Creatinine, Ser: 0.69 mg/dL (ref 0.44–1.00)
GFR calc Af Amer: >60 mL/min (ref >60)
GFR calc non Af Amer: >60 mL/min (ref >60)
Glucose, Bld: 97 mg/dL (ref 70–99)
Potassium: 4.2 mmol/L (ref 3.5–5.1)
Sodium: 132 mmol/L — ABNORMAL LOW (ref 135–145)

## 2019-09-24 LAB — URINALYSIS, COMPLETE (UACMP) WITH MICROSCOPIC
Bacteria, UA: NONE SEEN
Bilirubin Urine: NEGATIVE
Glucose, UA: NEGATIVE mg/dL
Ketones, ur: 5 mg/dL — AB
Leukocytes,Ua: NEGATIVE
Nitrite: NEGATIVE
Protein, ur: NEGATIVE mg/dL
Specific Gravity, Urine: 1.008 (ref 1.005–1.030)
Squamous Epithelial / HPF: NONE SEEN (ref 0–5)
pH: 6 (ref 5.0–8.0)

## 2019-09-24 LAB — URINE DRUG SCREEN, QUALITATIVE (ARMC ONLY)
Amphetamines, Ur Screen: NOT DETECTED
Barbiturates, Ur Screen: NOT DETECTED
Benzodiazepine, Ur Scrn: NOT DETECTED
Cannabinoid 50 Ng, Ur ~~LOC~~: NOT DETECTED
Cocaine Metabolite,Ur ~~LOC~~: NOT DETECTED
MDMA (Ecstasy)Ur Screen: NOT DETECTED
Methadone Scn, Ur: NOT DETECTED
Opiate, Ur Screen: NOT DETECTED
Phencyclidine (PCP) Ur S: NOT DETECTED
Tricyclic, Ur Screen: NOT DETECTED

## 2019-09-24 LAB — ETHANOL: Alcohol, Ethyl (B): <10 mg/dL (ref <10)

## 2019-09-24 LAB — SALICYLATE LEVEL: Salicylate Lvl: <7 mg/dL — ABNORMAL LOW (ref 7.0–30.0)

## 2019-09-24 LAB — ACETAMINOPHEN LEVEL: Acetaminophen (Tylenol), Serum: <10 ug/mL — ABNORMAL LOW (ref 10–30)

## 2019-09-24 LAB — LIPASE, BLOOD: Lipase: 19 U/L (ref 11–51)

## 2019-09-24 LAB — LACTIC ACID, PLASMA: Lactic Acid, Venous: 1.2 mmol/L (ref 0.5–1.9)

## 2019-09-24 MED ORDER — SODIUM CHLORIDE 0.9 % IV BOLUS
1000.0000 mL | Freq: Once | INTRAVENOUS | Status: AC
  Administered 2019-09-24: 1000 mL via INTRAVENOUS

## 2019-09-24 MED ORDER — FAMOTIDINE 20 MG PO TABS
20.0000 mg | ORAL_TABLET | Freq: Once | ORAL | Status: AC
  Administered 2019-09-24: 20 mg via ORAL
  Filled 2019-09-24: qty 1

## 2019-09-24 MED ORDER — IOHEXOL 9 MG/ML PO SOLN
500.0000 mL | Freq: Two times a day (BID) | ORAL | Status: DC | PRN
  Administered 2019-09-24 (×2): 500 mL via ORAL
  Filled 2019-09-24: qty 500

## 2019-09-24 MED ORDER — DICYCLOMINE HCL 10 MG PO CAPS
10.0000 mg | ORAL_CAPSULE | Freq: Once | ORAL | Status: AC
  Administered 2019-09-24: 10 mg via ORAL
  Filled 2019-09-24: qty 1

## 2019-09-24 MED ORDER — IOHEXOL 300 MG/ML  SOLN
100.0000 mL | Freq: Once | INTRAMUSCULAR | Status: AC | PRN
  Administered 2019-09-24: 100 mL via INTRAVENOUS

## 2019-09-24 NOTE — ED Provider Notes (Signed)
[facility] Emergency Department Provider Note ____________________________________________   First MD Initiated Contact with Patient 09/24/19 1934     (approximate)  I have reviewed the triage vital signs and the nursing notes.   HISTORY  Chief Complaint Weakness    HPI Chelsey [name] is a 69 y.o. female with PMH as noted below who presents with abdominal pain which she states has been present for months, persistent course, occurring constantly, and located in her entire abdomen from the suprapubic region to the epigastric area. It is intermittently crampy. She has associated constipation and states that she has only been able to have very small bowel movements for months. She reports nausea but no vomiting. The patient states that she has been seen by her PMD for this and was told that she was constipated, but no medicine seems to be helping.  The patient also reports that 2 days ago she took approximately 15 to 30 tablets of trazodone in an attempt to kill herself. She states that she has been despondent over the chronic pain and feels like she wants to die if the pain does not get better. She denies any other attempts at self-harm. She denies any alcohol or illicit drug use.  Past Medical History:  Diagnosis Date  . Breast cancer ([place]) 1997   right breast, radiation  . Bronchitis    recent/ 06/09/15 had chest xray/ [facility]/resolved  . Cancer [facility]) 1997   right lumpectomy,L/Ad/R   . GERD (gastroesophageal reflux disease)   . History of hiatal hernia   . Hypercholesteremia   . Hyperlipidemia   . Low BP    TYPICALLY RUNS 80'S/60'S  . Panic attack   . Personal history of malignant neoplasm of breast     Patient Active Problem List   Diagnosis Date Noted  . Chronic constipation 07/18/2019  . Dysphagia 07/26/2018  . Anorexia 07/26/2018  . Palliative care encounter 07/26/2018  . Goals of care, counseling/discussion   . Palliative care  by specialist   . PEG tube malfunction ([place])   . Dysphasia   . Sepsis ([place]) 07/14/2018  . Depression with suicidal ideation 04/11/2018  . Dehydration 03/28/2018  . Protein-calorie malnutrition, severe 03/11/2018  . S/P repair of paraesophageal hernia 03/09/2018  . Benign neoplasm of ascending colon   . Osteoporosis 07/27/2017  . Tobacco abuse 07/27/2017  . Painful orthopaedic hardware ([place]) 06/24/2017  . Overweight with body mass index (BMI) 25.0-29.9 05/18/2017  . 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 Insomnia 01/12/2017  . Anemia 01/12/2017  . Status post shoulder replacement 12/27/2016  . Pressure injury of skin 12/27/2016  . S/P ORIF (open reduction internal fixation) fracture 11/23/2016  . Proximal humerus fracture 11/23/2016  . Panic attack 08/17/2016  . Abdominal pain 05/21/2016  . Special screening for malignant neoplasms, colon   . Benign neoplasm of descending colon   . Benign neoplasm of sigmoid colon   . Gastroesophageal reflux disease without esophagitis 06/13/2014  . H/O hypercholesterolemia 06/13/2014  . Depression, major, recurrent, moderate ([place]) 06/13/2014  . History of breast cancer 03/12/2013    Past Surgical History:  Procedure Laterality Date  . BICEPT TENODESIS Left 11/23/2016   Procedure: BICEPS TENODESIS;  Surgeon: Leim Fabry, MD;  Location: [facility] ORS;  Service: Orthopedics;  Laterality: Left;  . BREAST EXCISIONAL BIOPSY Right 1997   pos  . BREAST SURGERY Right 1997   lumpectomy  . CHOLECYSTECTOMY N/A 08/06/2016   Procedure: LAPAROSCOPIC CHOLECYSTECTOMY WITH INTRAOPERATIVE  CHOLANGIOGRAM;  Surgeon: Christene Lye, MD;  Location: [facility] ORS;  Service: General;  Laterality: N/A;  . COLONOSCOPY  2008  . COLONOSCOPY WITH PROPOFOL N/A 07/21/2015   Procedure: COLONOSCOPY WITH PROPOFOL;  Surgeon: Lucilla Lame, MD;  Location: [place];  Service: Endoscopy;  Laterality: N/A;  . COLONOSCOPY WITH PROPOFOL N/A 01/05/2018    Procedure: COLONOSCOPY WITH PROPOFOL;  Surgeon: Jonathon Bellows, MD;  Location[facility] ENDOSCOPY;  Service: Gastroenterology;  Laterality: N/A;  . ESOPHAGOGASTRODUODENOSCOPY (EGD) WITH PROPOFOL N/A 06/16/2016   Procedure: ESOPHAGOGASTRODUODENOSCOPY (EGD) WITH PROPOFOL;  Surgeon: Christene Lye, MD;  Location: [facility] ENDOSCOPY;  Service: Endoscopy;  Laterality: N/A;  . ESOPHAGOGASTRODUODENOSCOPY (EGD) WITH PROPOFOL N/A 01/05/2018   Procedure: ESOPHAGOGASTRODUODENOSCOPY (EGD) WITH PROPOFOL;  Surgeon: Jonathon Bellows, MD;  Location: [facility] ENDOSCOPY;  Service: Gastroenterology;  Laterality: N/A;  . ESOPHAGOGASTRODUODENOSCOPY (EGD) WITH PROPOFOL N/A 06/13/2018   Procedure: ESOPHAGOGASTRODUODENOSCOPY (EGD) WITH PROPOFOL;  Surgeon: Jonathon Bellows, MD;  Location[facility];  Service: Gastroenterology;  Laterality: N/A;  . ESOPHAGOGASTRODUODENOSCOPY (EGD) WITH PROPOFOL N/A 07/16/2018   Procedure: ESOPHAGOGASTRODUODENOSCOPY (EGD) WITH PROPOFOL;  Surgeon: Lucilla Lame, MD;  Location: [facility] ENDOSCOPY;  Service: Endoscopy;  Laterality: N/A;  . HARDWARE REMOVAL Left 12/27/2016   Procedure: HARDWARE REMOVAL LEFT  PROXIMAL HUMEROUS;  Surgeon: Leim Fabry, MD;  Location: [facility] ORS;  Service: Orthopedics;  Laterality: Left;  . IR GJ TUBE CHANGE  04/21/2018  . NISSEN FUNDOPLICATION N/A 3/78/5885   Procedure: NISSEN FUNDOPLICATION;  Surgeon: Jules Husbands, MD;  Location: [facility] ORS;  Service: General;  Laterality: N/A;  . ORIF HUMERUS FRACTURE Left 11/23/2016   Procedure: OPEN REDUCTION INTERNAL FIXATION (ORIF) PROXIMAL HUMERUS FRACTURE;  Surgeon: Leim Fabry, MD;  Location: [facility] ORS;  Service: Orthopedics;  Laterality: Left;  . REPAIR OF ESOPHAGUS  03/09/2018   Procedure: REPAIR OF ESOPHAGUS;  Surgeon: Jules Husbands, MD;  Location: [facility] ORS;  Service: General;;  . REVERSE SHOULDER ARTHROPLASTY Left 12/27/2016   Procedure: REVERSE SHOULDER ARTHROPLASTY;  Surgeon: Leim Fabry, MD;  Location: [facility] ORS;  Service: Orthopedics;   Laterality: Left;  . ROBOTIC ASSISTED LAPAROSCOPIC REPAIR OF PARAESOPHAGEAL HERNIA N/A 03/09/2018   Procedure: ROBOTIC HIATAL HERNIA CONVERTED TO OPEN;  Surgeon: Jules Husbands, MD;  Location: [facility] ORS;  Service: General;  Laterality: N/A;    Prior to Admission medications   Medication Sig Start Date End Date Taking? Authorizing Provider  linaclotide [facility]) 72 MCG capsule Take 1 capsule (72 mcg total) by mouth daily before breakfast. 07/12/19  Yes Volney American, PA-C  omeprazole (PRILOSEC) 20 MG capsule Take 1 capsule (20 mg total) by mouth 2 (two) times daily before a meal. 07/27/17  Yes Kathrine Haddock, NP  PARoxetine (PAXIL) 20 MG tablet Take 1 tablet (20 mg total) by mouth daily before supper. 07/12/19  Yes Volney American, PA-C  traZODone (DESYREL) 100 MG tablet Take 1 tablet (100 mg total) by mouth at bedtime. 07/12/19  Yes Volney American, PA-C  bisacodyl (DULCOLAX) 5 MG EC tablet Take 1 tablet (5 mg total) by mouth daily. Patient not taking: Reported on 09/24/2019 07/19/18   Bettey Costa, MD  busPIRone (BUSPAR) 10 MG tablet Take 1 tablet (10 mg total) by mouth 2 (two) times daily. Patient not taking: Reported on 09/24/2019 05/29/18   Rainey Pines, MD  polyethylene glycol (MIRALAX / GLYCOLAX) 17 g packet Take 17 g by mouth daily. 07/19/18   Bettey Costa, MD  Simethicone (GAS-X PO) Take 1-2 tablets by mouth daily as needed (gas).  [provider]  simvastatin (ZOCOR) 20 MG tablet TAKE ONE TABLET BY MOUTH AT BEDTIME Patient not taking: Reported on 07/12/2019 12/16/17   Kathrine Haddock, NP    Allergies Patient has no known allergies.  Family History  Problem Relation Age of Onset  . Anxiety disorder Brother   . Obesity Brother   . COPD Brother   . Kidney disease Mother   . Heart attack Father   . Hypertension Father   . Alcohol abuse Father   . Depression Brother   . Anxiety disorder Brother   . Breast cancer Maternal Grandmother     Social  History Social History   Tobacco Use  . Smoking status: Current Some Day Smoker    Packs/day: 0.10    Years: 10.00    Pack years: 1.00    Types: Cigarettes    Start date: 11/25/2009  . Smokeless tobacco: Never Used  . Tobacco comment: 1 cigarette daily  Vaping Use  . Vaping Use: Never used  Substance Use Topics  . Alcohol use: No    Alcohol/week: 0.0 standard drinks  . Drug use: No    Review of Systems  Constitutional: No fever. Eyes: No redness. ENT: No sore throat. Cardiovascular: Denies chest pain. Respiratory: Denies shortness of breath. Gastrointestinal: Positive for nausea. No vomiting or diarrhea. Genitourinary: Negative for dysuria.  Musculoskeletal: Negative for back pain. Skin: Negative for rash. Neurological: Negative for headache.   ____________________________________________   PHYSICAL EXAM:  VITAL SIGNS: ED Triage Vitals  Enc Vitals Group     BP 09/24/19 1857 (!) 76/56     Pulse Rate 09/24/19 1857 98     Resp 09/24/19 1857 18     Temp 09/24/19 1857 98.7 F (37.1 C)     Temp Source 09/24/19 1857 Oral     SpO2 09/24/19 1857 96 %     Weight 09/24/19 1859 120 lb (54.4 kg)     Height 09/24/19 1859 5\' 5"  (1.651 m)     Head Circumference --      Peak Flow --      Pain Score 09/24/19 1858 10     Pain Loc --      Pain Edu? --      Excl. in [place]? --     Constitutional: Alert and oriented. Well appearing and in no acute distress. Eyes: Conjunctivae are normal.  Head: Atraumatic. Nose: No congestion/rhinnorhea. Mouth/Throat: Mucous membranes are moist.   Neck: Normal range of motion.  Cardiovascular: Normal rate, regular rhythm. Good peripheral circulation. Respiratory: Normal respiratory effort.  No retractions. Gastrointestinal: Soft with mild diffuse discomfort but no focal tenderness. No distention.  Genitourinary: No flank tenderness. Musculoskeletal: No lower extremity edema.   Neurologic:  Normal speech and language. No gross focal  neurologic deficits are appreciated.  Skin:  Skin is warm and dry. No rash noted. Psychiatric: Somewhat flattened affect and depressed mood.  ____________________________________________   LABS (all labs ordered are listed, but only abnormal results are displayed)  Labs Reviewed  BASIC METABOLIC PANEL - Abnormal; Notable for the following components:      Result Value   Sodium 132 (*)    Chloride 95 (*)    Calcium 8.6 (*)    All other components within normal limits  URINALYSIS, COMPLETE (UACMP) WITH MICROSCOPIC - Abnormal; Notable for the following components:   Color, Urine YELLOW (*)    APPearance CLEAR (*)    Hgb urine dipstick SMALL (*)    Ketones, ur 5 (*)  All other components within normal limits  HEPATIC FUNCTION PANEL - Abnormal; Notable for the following components:   AST 54 (*)    ALT 61 (*)    Total Bilirubin 1.9 (*)    Bilirubin, Direct 0.3 (*)    Indirect Bilirubin 1.6 (*)    All other components within normal limits  ACETAMINOPHEN LEVEL - Abnormal; Notable for the following components:   Acetaminophen (Tylenol), Serum <10 (*)    All other components within normal limits  SALICYLATE LEVEL - Abnormal; Notable for the following components:   Salicylate Lvl <7.9 (*)    All other components within normal limits  CBC  LIPASE, BLOOD  LACTIC ACID, PLASMA  ETHANOL  URINE DRUG SCREEN, QUALITATIVE ([facility] ONLY)  CBG MONITORING, ED   ____________________________________________  EKG  ED ECG REPORT I, Arta Silence, the attending physician, personally viewed and interpreted this ECG.  Date: 09/24/2019 EKG Time: 1910 Rate: 96 Rhythm: normal sinus rhythm QRS Axis: normal Intervals: normal ST/T Wave abnormalities: normal Narrative Interpretation: no evidence of acute ischemia  ____________________________________________  RADIOLOGY  CT abdomen/pelvis: Moderate fecal retention. No acute abnormality. CT head: No ICH or other acute abnormality XR L  knee: Degenerative changes with no acute fracture ____________________________________________   PROCEDURES  Procedure(s) performed: No  Procedures  Critical Care performed: No ____________________________________________   INITIAL IMPRESSION / ASSESSMENT AND PLAN / ED COURSE  Pertinent labs & imaging results that were available during my care of the patient were reviewed by me and considered in my medical decision making (see chart for details).  69 year old female with PMH as noted above presents with chronic abdominal pain for months associated with constipation. She also reports an intentional trazodone overdose 2 days ago; she states she has had suicidal ideation and has been feeling despondent over her chronic abdominal pain. Since taking the trazodone she reports being very sleepy, weak, and has fallen multiple times.  I reviewed the past medical records in [place]. Patient was most recently admitted in June 2020 for sepsis. She has had no ED visits since then. Besides trazodone, she is also on BuSpar. She denies any other coingestion.  On exam, the patient is alert and oriented. Her vital signs are normal except for hypotension which has started to improve without intervention. Neurologic exam is nonfocal. She has a few abrasions and skin tears to the extremities. Exam is otherwise as described above.  1. Chronic abdominal pain: I reviewed past imaging, most recently from 2020, which shows a large stool burden. Overall I suspect most likely constipation although differential includes malignancy, colitis, diverticulitis, or other subacute causes. We will obtain lab work-up and a CT for further evaluation.  2. Trazodone overdose/SI: The patient has been placed under involuntary commitment due to concern for acute danger to self, and I have ordered psychiatry and TTS consultations. Given that it has been 2 days since she took the medication, she is alert and oriented, with a normal EKG  there is no evidence of acute complication from the trazodone overdose. We will monitor the patient's blood pressure, give a fluid bolus, and review labs. I have also ordered labs for coingestants.  ----------------------------------------- 11:15 PM on 09/24/2019 -----------------------------------------  Lab work-up and CT are unremarkable, except for minimally elevated bilirubin and LFTs. This is of unclear significance but does not require further emergent evaluation. We will give some medications for symptomatic treatment of the abdominal pain.  At this time, the patient is pending psychiatry evaluation. Am signing the patient out  to the oncoming physician Dr. Karma Greaser. ____________________________  The patient has been placed in psychiatric observation due to the need to provide a safe environment for the patient while obtaining psychiatric consultation and evaluation, as well as ongoing medical and medication management to treat the patient's condition.  The patient has been placed under full IVC at this time.  ____________________________________________   FINAL CLINICAL IMPRESSION(S) / ED DIAGNOSES  Final diagnoses:  Chronic abdominal pain  Suicidal ideation      NEW MEDICATIONS STARTED DURING THIS VISIT:  New Prescriptions   No medications on file     Note:  This document was prepared using Dragon voice recognition software and may include unintentional dictation errors.    Arta Silence, MD 09/24/19 2317

## 2019-09-24 NOTE — ED Notes (Signed)
Pt given ginger ale and meal tray.  

## 2019-09-24 NOTE — ED Notes (Signed)
Pt dressed out in burgandy scrubs at this time and yellow non slip socks placed on pt. Pts belongings consist of white t shirt and blue panties. Pt has grey purse with her containing black cell phone and black wallet.

## 2019-09-24 NOTE — ED Triage Notes (Signed)
Pt via ems from home with abdominal pain and  weakness x "months." Pt states she has been to PCP several times and has been told she is constipated. Pt states she took a half a bottle of trazodone on Saturday because " I can't stand the pain and nobody will help me with it and I wanted to die." Pt states she has fallen multiple times since taking the trazodone. She has hit her head during at least some of the falls and has lost consciousness at least once for "a couple of minutes." Pt alert & oriented, nad noted.

## 2019-09-25 ENCOUNTER — Encounter: Payer: Self-pay | Admitting: Psychiatry

## 2019-09-25 ENCOUNTER — Inpatient Hospital Stay
Admission: AD | Admit: 2019-09-25 | Discharge: 2019-10-15 | DRG: 885 | Disposition: A | Discharge status: Home / Self Care | Payer: Medicare Other | Source: Intra-hospital | Attending: Psychiatry | Admitting: Psychiatry

## 2019-09-25 ENCOUNTER — Other Ambulatory Visit: Payer: Self-pay

## 2019-09-25 DIAGNOSIS — F17210 Nicotine dependence, cigarettes, uncomplicated: Secondary | ICD-10-CM | POA: Diagnosis present

## 2019-09-25 DIAGNOSIS — K5900 Constipation, unspecified: Secondary | ICD-10-CM | POA: Diagnosis not present

## 2019-09-25 DIAGNOSIS — M5126 Other intervertebral disc displacement, lumbar region: Secondary | ICD-10-CM | POA: Diagnosis not present

## 2019-09-25 DIAGNOSIS — Z20822 Contact with and (suspected) exposure to covid-19: Secondary | ICD-10-CM | POA: Diagnosis present

## 2019-09-25 DIAGNOSIS — M48061 Spinal stenosis, lumbar region without neurogenic claudication: Secondary | ICD-10-CM | POA: Diagnosis not present

## 2019-09-25 DIAGNOSIS — F411 Generalized anxiety disorder: Secondary | ICD-10-CM | POA: Diagnosis present

## 2019-09-25 DIAGNOSIS — Z79899 Other long term (current) drug therapy: Secondary | ICD-10-CM

## 2019-09-25 DIAGNOSIS — G4700 Insomnia, unspecified: Secondary | ICD-10-CM | POA: Diagnosis present

## 2019-09-25 DIAGNOSIS — M21372 Foot drop, left foot: Secondary | ICD-10-CM | POA: Diagnosis not present

## 2019-09-25 DIAGNOSIS — G3184 Mild cognitive impairment, so stated: Secondary | ICD-10-CM | POA: Diagnosis not present

## 2019-09-25 DIAGNOSIS — K219 Gastro-esophageal reflux disease without esophagitis: Secondary | ICD-10-CM | POA: Diagnosis present

## 2019-09-25 DIAGNOSIS — M25512 Pain in left shoulder: Secondary | ICD-10-CM | POA: Diagnosis present

## 2019-09-25 DIAGNOSIS — M810 Age-related osteoporosis without current pathological fracture: Secondary | ICD-10-CM | POA: Diagnosis present

## 2019-09-25 DIAGNOSIS — F332 Major depressive disorder, recurrent severe without psychotic features: Secondary | ICD-10-CM

## 2019-09-25 DIAGNOSIS — Z803 Family history of malignant neoplasm of breast: Secondary | ICD-10-CM | POA: Diagnosis not present

## 2019-09-25 DIAGNOSIS — R531 Weakness: Secondary | ICD-10-CM | POA: Diagnosis not present

## 2019-09-25 DIAGNOSIS — K5909 Other constipation: Secondary | ICD-10-CM | POA: Diagnosis present

## 2019-09-25 DIAGNOSIS — I700 Atherosclerosis of aorta: Secondary | ICD-10-CM | POA: Diagnosis not present

## 2019-09-25 DIAGNOSIS — Z96612 Presence of left artificial shoulder joint: Secondary | ICD-10-CM

## 2019-09-25 DIAGNOSIS — R296 Repeated falls: Secondary | ICD-10-CM | POA: Diagnosis not present

## 2019-09-25 DIAGNOSIS — R109 Unspecified abdominal pain: Secondary | ICD-10-CM | POA: Diagnosis not present

## 2019-09-25 DIAGNOSIS — Z818 Family history of other mental and behavioral disorders: Secondary | ICD-10-CM

## 2019-09-25 DIAGNOSIS — B353 Tinea pedis: Secondary | ICD-10-CM | POA: Diagnosis present

## 2019-09-25 DIAGNOSIS — Z915 Personal history of self-harm: Secondary | ICD-10-CM | POA: Diagnosis not present

## 2019-09-25 DIAGNOSIS — R45851 Suicidal ideations: Secondary | ICD-10-CM | POA: Diagnosis present

## 2019-09-25 DIAGNOSIS — G894 Chronic pain syndrome: Secondary | ICD-10-CM

## 2019-09-25 DIAGNOSIS — R42 Dizziness and giddiness: Secondary | ICD-10-CM | POA: Diagnosis not present

## 2019-09-25 DIAGNOSIS — M1712 Unilateral primary osteoarthritis, left knee: Secondary | ICD-10-CM | POA: Diagnosis not present

## 2019-09-25 DIAGNOSIS — M4316 Spondylolisthesis, lumbar region: Secondary | ICD-10-CM | POA: Diagnosis not present

## 2019-09-25 DIAGNOSIS — E785 Hyperlipidemia, unspecified: Secondary | ICD-10-CM | POA: Diagnosis not present

## 2019-09-25 DIAGNOSIS — Z853 Personal history of malignant neoplasm of breast: Secondary | ICD-10-CM | POA: Diagnosis not present

## 2019-09-25 DIAGNOSIS — B351 Tinea unguium: Secondary | ICD-10-CM | POA: Diagnosis present

## 2019-09-25 DIAGNOSIS — M25462 Effusion, left knee: Secondary | ICD-10-CM | POA: Diagnosis not present

## 2019-09-25 DIAGNOSIS — K5730 Diverticulosis of large intestine without perforation or abscess without bleeding: Secondary | ICD-10-CM | POA: Diagnosis not present

## 2019-09-25 DIAGNOSIS — D1809 Hemangioma of other sites: Secondary | ICD-10-CM | POA: Diagnosis not present

## 2019-09-25 DIAGNOSIS — R29818 Other symptoms and signs involving the nervous system: Secondary | ICD-10-CM | POA: Diagnosis not present

## 2019-09-25 DIAGNOSIS — K838 Other specified diseases of biliary tract: Secondary | ICD-10-CM | POA: Diagnosis not present

## 2019-09-25 LAB — SARS CORONAVIRUS 2 BY RT PCR (HOSPITAL ORDER, PERFORMED IN ~~LOC~~ HOSPITAL LAB): SARS Coronavirus 2: NEGATIVE

## 2019-09-25 MED ORDER — LINACLOTIDE 72 MCG PO CAPS
72.0000 ug | ORAL_CAPSULE | Freq: Every day | ORAL | Status: DC
  Filled 2019-09-25: qty 1

## 2019-09-25 MED ORDER — POLYETHYLENE GLYCOL 3350 17 G PO PACK
17.0000 g | PACK | Freq: Every day | ORAL | Status: DC
  Administered 2019-09-27 – 2019-10-15 (×18): 17 g via ORAL
  Filled 2019-09-25 (×19): qty 1

## 2019-09-25 MED ORDER — PANTOPRAZOLE SODIUM 40 MG PO TBEC
40.0000 mg | DELAYED_RELEASE_TABLET | Freq: Every day | ORAL | Status: DC
  Administered 2019-09-26 – 2019-10-15 (×20): 40 mg via ORAL
  Filled 2019-09-25 (×20): qty 1

## 2019-09-25 MED ORDER — BUSPIRONE HCL 5 MG PO TABS
10.0000 mg | ORAL_TABLET | Freq: Two times a day (BID) | ORAL | Status: DC
  Administered 2019-09-26 – 2019-10-01 (×11): 10 mg via ORAL
  Filled 2019-09-25 (×11): qty 2

## 2019-09-25 MED ORDER — PAROXETINE HCL 20 MG PO TABS
20.0000 mg | ORAL_TABLET | Freq: Every day | ORAL | Status: DC
  Filled 2019-09-25: qty 1

## 2019-09-25 MED ORDER — TRAZODONE HCL 100 MG PO TABS
100.0000 mg | ORAL_TABLET | Freq: Every day | ORAL | Status: DC
  Administered 2019-09-25: 100 mg via ORAL
  Filled 2019-09-25: qty 1

## 2019-09-25 MED ORDER — PAROXETINE HCL 20 MG PO TABS
20.0000 mg | ORAL_TABLET | Freq: Every day | ORAL | Status: DC
  Administered 2019-09-25: 20 mg via ORAL
  Filled 2019-09-25: qty 1

## 2019-09-25 MED ORDER — BUSPIRONE HCL 5 MG PO TABS
10.0000 mg | ORAL_TABLET | Freq: Two times a day (BID) | ORAL | Status: DC
  Administered 2019-09-25: 10 mg via ORAL
  Filled 2019-09-25: qty 2

## 2019-09-25 MED ORDER — DICYCLOMINE HCL 10 MG PO CAPS
10.0000 mg | ORAL_CAPSULE | Freq: Once | ORAL | Status: AC
Start: 2019-09-25 — End: 2019-09-25
  Administered 2019-09-25: 10 mg via ORAL
  Filled 2019-09-25: qty 1

## 2019-09-25 MED ORDER — LINACLOTIDE 72 MCG PO CAPS
72.0000 ug | ORAL_CAPSULE | Freq: Every day | ORAL | Status: DC
  Administered 2019-09-26 – 2019-09-28 (×3): 72 ug via ORAL
  Filled 2019-09-25 (×5): qty 1

## 2019-09-25 MED ORDER — IOHEXOL 9 MG/ML PO SOLN: 500.0000 mL | Freq: Two times a day (BID) | ORAL | Status: DC | PRN

## 2019-09-25 MED ORDER — TRAZODONE HCL 100 MG PO TABS: 100.0000 mg | ORAL_TABLET | Freq: Every day | ORAL | Status: DC

## 2019-09-25 MED ORDER — MAGNESIUM CITRATE PO SOLN
1.0000 | Freq: Once | ORAL | Status: AC
  Administered 2019-09-25: 1 via ORAL

## 2019-09-25 MED ORDER — PANTOPRAZOLE SODIUM 40 MG PO TBEC
40.0000 mg | DELAYED_RELEASE_TABLET | Freq: Every day | ORAL | Status: DC
  Administered 2019-09-25: 40 mg via ORAL
  Filled 2019-09-25: qty 1

## 2019-09-25 MED ORDER — POLYETHYLENE GLYCOL 3350 17 G PO PACK
17.0000 g | PACK | Freq: Every day | ORAL | Status: DC
  Administered 2019-09-25: 17 g via ORAL
  Filled 2019-09-25: qty 1

## 2019-09-25 MED ORDER — SIMVASTATIN 10 MG PO TABS: 20.0000 mg | ORAL_TABLET | Freq: Every day | ORAL | Status: DC

## 2019-09-25 MED ORDER — MAGNESIUM CITRATE PO SOLN
1.0000 | Freq: Once | ORAL | Status: AC
  Administered 2019-09-25: 1 via ORAL
  Filled 2019-09-25: qty 296

## 2019-09-25 MED ORDER — ALUM & MAG HYDROXIDE-SIMETH 200-200-20 MG/5ML PO SUSP
30.0000 mL | ORAL | Status: DC | PRN
  Administered 2019-10-07 – 2019-10-13 (×4): 30 mL via ORAL
  Filled 2019-09-25 (×6): qty 30

## 2019-09-25 MED ORDER — SIMVASTATIN 40 MG PO TABS
20.0000 mg | ORAL_TABLET | Freq: Every day | ORAL | Status: DC
  Administered 2019-09-25 – 2019-10-01 (×7): 20 mg via ORAL
  Filled 2019-09-25 (×7): qty 1

## 2019-09-25 MED ORDER — ACETAMINOPHEN 325 MG PO TABS
650.0000 mg | ORAL_TABLET | Freq: Four times a day (QID) | ORAL | Status: DC | PRN
  Administered 2019-09-26 – 2019-10-13 (×9): 650 mg via ORAL
  Filled 2019-09-25 (×10): qty 2

## 2019-09-25 MED ORDER — MAGNESIUM HYDROXIDE 400 MG/5ML PO SUSP
30.0000 mL | Freq: Every day | ORAL | Status: DC | PRN
  Administered 2019-10-06 – 2019-10-12 (×3): 30 mL via ORAL
  Filled 2019-09-25 (×4): qty 30

## 2019-09-25 NOTE — ED Notes (Signed)
As per psych patient will have a bed available once she has had a bowel movement, unable to send patient to lower level psych unit due to constipation being a medical issue.

## 2019-09-25 NOTE — ED Notes (Signed)
Hourly rounding reveals patient in room. No complaints, stable, in no acute distress. Q15 minute rounds and monitoring via Rover and Officer to continue.   

## 2019-09-25 NOTE — ED Notes (Signed)
Patient ambulated to bathroom w/o difficulties and steady gait. Patient had 2nd small bowel movement.

## 2019-09-25 NOTE — ED Notes (Signed)
Patient had small bowel movement.

## 2019-09-25 NOTE — BH Assessment (Signed)
TTS completed reassessment. Pt presents calm, pleasant and oriented x 3. Pt expressed concerns with stomach pains and being unable to take a bowel movement. Pt continues to express feeling her only option to get any relief is to end her life. Pt denies any current HI/AH/VH and is unable to contract for safety.   Per Waylan Boga, NP pt continues to meet criteria for INPT

## 2019-09-25 NOTE — BH Assessment (Addendum)
Assessment Note  Chelsey [name] is an 69 y.o. female who presents to the ER due to ingesting 21 Trazodone's, with the intentions of ending her life. She states she has had pain in her stomach for several months and she was unable to live with it any more. She felt the only way she was going to get any relief was to end her life. She took the medications Sunday (09/23/2019). After taking the medications, her stomach pain worsened. When she attempted to walk, she was unable to keep her balance. She started falling and having increase feelings sickness. This continued till the time she arrived to the ER (09/24/2019).  During the interview, the patient was calm, cooperative and pleasant. She was able to provide appropriate answers to the questions. She denies history of violence and aggression. She denies the use of mind-altering substances and no involvement with the legal system. Throughout the interview, she denied HI and AV/H and continues to endorse SI.  Diagnosis: Major Depression  Past Medical History:  Past Medical History:  Diagnosis Date  . Breast cancer ([place]) 1997   right breast, radiation  . Bronchitis    recent/ 06/09/15 had chest xray/ [facility]/resolved  . Cancer [facility]) 1997   right lumpectomy,L/Ad/R   . GERD (gastroesophageal reflux disease)   . History of hiatal hernia   . Hypercholesteremia   . Hyperlipidemia   . Low BP    TYPICALLY RUNS 80'S/60'S  . Panic attack   . Personal history of malignant neoplasm of breast     Past Surgical History:  Procedure Laterality Date  . BICEPT TENODESIS Left 11/23/2016   Procedure: BICEPS TENODESIS;  Surgeon: Leim Fabry, MD;  Location: [facility] ORS;  Service: Orthopedics;  Laterality: Left;  . BREAST EXCISIONAL BIOPSY Right 1997   pos  . BREAST SURGERY Right 1997   lumpectomy  . CHOLECYSTECTOMY N/A 08/06/2016   Procedure: LAPAROSCOPIC CHOLECYSTECTOMY WITH INTRAOPERATIVE CHOLANGIOGRAM;  Surgeon: Christene Lye, MD;   Location: [facility];  Service: General;  Laterality: N/A;  . COLONOSCOPY  2008  . COLONOSCOPY WITH PROPOFOL N/A 07/21/2015   Procedure: COLONOSCOPY WITH PROPOFOL;  Surgeon: Lucilla Lame, MD;  Location: [place];  Service: Endoscopy;  Laterality: N/A;  . COLONOSCOPY WITH PROPOFOL N/A 01/05/2018   Procedure: COLONOSCOPY WITH PROPOFOL;  Surgeon: Jonathon Bellows, MD;  Location: [facility] ENDOSCOPY;  Service: Gastroenterology;  Laterality: N/A;  . ESOPHAGOGASTRODUODENOSCOPY (EGD) WITH PROPOFOL N/A 06/16/2016   Procedure: ESOPHAGOGASTRODUODENOSCOPY (EGD) WITH PROPOFOL;  Surgeon: Christene Lye, MD;  Location: [facility] ENDOSCOPY;  Service: Endoscopy;  Laterality: N/A;  . ESOPHAGOGASTRODUODENOSCOPY (EGD) WITH PROPOFOL N/A 01/05/2018   Procedure: ESOPHAGOGASTRODUODENOSCOPY (EGD) WITH PROPOFOL;  Surgeon: Jonathon Bellows, MD;  Location: [facility] ENDOSCOPY;  Service: Gastroenterology;  Laterality: N/A;  . ESOPHAGOGASTRODUODENOSCOPY (EGD) WITH PROPOFOL N/A 06/13/2018   Procedure: ESOPHAGOGASTRODUODENOSCOPY (EGD) WITH PROPOFOL;  Surgeon: Jonathon Bellows, MD;  Location[facility] ENDOSCOPY;  Service: Gastroenterology;  Laterality: N/A;  . ESOPHAGOGASTRODUODENOSCOPY (EGD) WITH PROPOFOL N/A 07/16/2018   Procedure: ESOPHAGOGASTRODUODENOSCOPY (EGD) WITH PROPOFOL;  Surgeon: Lucilla Lame, MD;  Location: [facility] ENDOSCOPY;  Service: Endoscopy;  Laterality: N/A;  . HARDWARE REMOVAL Left 12/27/2016   Procedure: HARDWARE REMOVAL LEFT  PROXIMAL HUMEROUS;  Surgeon: Leim Fabry, MD;  Location: [facility] ORS;  Service: Orthopedics;  Laterality: Left;  . IR GJ TUBE CHANGE  04/21/2018  . NISSEN FUNDOPLICATION N/A 09/04/9145   Procedure: NISSEN FUNDOPLICATION;  Surgeon: Jules Husbands, MD;  Location: [facility] ORS;  Service: General;  Laterality: N/A;  . ORIF  HUMERUS FRACTURE Left 11/23/2016   Procedure: OPEN REDUCTION INTERNAL FIXATION (ORIF) PROXIMAL HUMERUS FRACTURE;  Surgeon: Leim Fabry, MD;  Location: [facility] ORS;  Service: Orthopedics;  Laterality: Left;   . REPAIR OF ESOPHAGUS  03/09/2018   Procedure: REPAIR OF ESOPHAGUS;  Surgeon: Jules Husbands, MD;  Location: [facility];  Service: General;;  . REVERSE SHOULDER ARTHROPLASTY Left 12/27/2016   Procedure: REVERSE SHOULDER ARTHROPLASTY;  Surgeon: Leim Fabry, MD;  Location: [facility] OR[facility];  Service: Orthopedics;  Laterality: Left;  . ROBOTIC ASSISTED LAPAROSCOPIC REPAIR OF PARAESOPHAGEAL HERNIA N/A 03/09/2018   Procedure: ROBOTIC HIATAL HERNIA CONVERTED TO OPEN;  Surgeon: Jules Husbands, MD;  Location: [facility] ORS;  Service: General;  Laterality: N/A;    Family History:  Family History  Problem Relation Age of Onset  . Anxiety disorder Brother   . Obesity Brother   . COPD Brother   . Kidney disease Mother   . Heart attack Father   . Hypertension Father   . Alcohol abuse Father   . Depression Brother   . Anxiety disorder Brother   . Breast cancer Maternal Grandmother     Social History:  reports that she has been smoking cigarettes. She started smoking about 9 years ago. She has a 1.00 pack-year smoking history. She has never used smokeless tobacco. She reports that she does not drink alcohol and does not use drugs.  Additional Social History:  Alcohol / Drug Use Pain Medications: See PTA Prescriptions: See PTA Over the Counter: See PTA History of alcohol / drug use?: No history of alcohol / drug abuse Longest period of sobriety (when/how long): Reports of none  CIWA: CIWA-Ar BP: (!) 145/82 Pulse Rate: 92 COWS:    Allergies: No Known Allergies  Home Medications: (Not in a hospital admission)   OB/GYN Status:  No LMP recorded (lmp unknown). Patient is postmenopausal.  General Assessment Data Location of Assessment: [facility] ED TTS Assessment: In system Is this a Tele or Face-to-Face Assessment?: Face-to-Face Is this an Initial Assessment or a Re-assessment for this encounter?: Initial Assessment Patient Accompanied by:: N/A Language Other than English: No Living Arrangements: Other  (Comment) (Private Home) What gender do you identify as?: Female Date Telepsych consult ordered in [facility]: 09/25/19 Time Telepsych consult ordered in [facility]: 1950 Marital status: Single Pregnancy Status: No Living Arrangements: Children (Son lives with her) Can pt return to current living arrangement?: Yes Admission Status: Involuntary Petitioner: ED Attending Is patient capable of signing voluntary admission?: No (Under IVC) Referral Source: Self/Family/Friend 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) Medical Exam completed: Yes  Crisis Care Plan Living Arrangements: Children (Son lives with her) Legal Guardian: Other: (Self) Name of Psychiatrist: Reports of none  Education Status Is patient currently in school?: No Is the patient employed, unemployed or receiving disability?: Unemployed  Risk to self with the past 6 months Suicidal Ideation: No Has patient been a risk to self within the past 6 months prior to admission? : No Suicidal Intent: No Has patient had any suicidal intent within the past 6 months prior to admission? : No Is patient at risk for suicide?: No Suicidal Plan?: No Has patient had any suicidal plan within the past 6 months prior to admission? : No Access to Means: No What has been your use of drugs/alcohol within the last 12 months?: Reports of none Previous Attempts/Gestures: No How many times?: 0 Other Self Harm Risks: Reports of none Triggers for Past Attempts: None known Intentional Self Injurious Behavior:  None Family Suicide History: Unknown Recent stressful life event(s): Other (Comment), Recent negative physical changes Persecutory voices/beliefs?: No Depression: Yes Depression Symptoms: Insomnia, Tearfulness, Isolating, Fatigue, Guilt, Loss of interest in usual pleasures, Feeling worthless/self pity Substance abuse history and/or treatment for substance abuse?: No Suicide prevention information given to non-admitted patients:  Not applicable  Risk to Others within the past 6 months Homicidal Ideation: No Does patient have any lifetime risk of violence toward others beyond the six months prior to admission? : No Thoughts of Harm to Others: No Current Homicidal Intent: No Current Homicidal Plan: No Access to Homicidal Means: No Identified Victim: Reports of none History of harm to others?: No Assessment of Violence: None Noted Violent Behavior Description: Reports of none Does patient have access to weapons?: No Criminal Charges Pending?: No Does patient have a court date: No Is patient on probation?: No  Psychosis Hallucinations: None noted Delusions: None noted  Mental Status Report Appearance/Hygiene: Unremarkable, In scrubs Eye Contact: Fair Motor Activity: Unable to assess (Patient laying in the bed) Speech: Logical/coherent, Unremarkable Level of Consciousness: Alert Mood: Depressed, Anxious, Pleasant, Helpless Affect: Anxious, Appropriate to circumstance, Depressed, Sad Anxiety Level: Minimal Thought Processes: Coherent, Relevant Judgement: Partial Orientation: Person, Place, Time, Situation, Appropriate for developmental age Obsessive Compulsive Thoughts/Behaviors: Minimal  Cognitive Functioning Concentration: Normal Memory: Recent Intact, Remote Intact Is patient IDD: No Insight: Fair Impulse Control: Poor Appetite: Fair Have you had any weight changes? : No Change Sleep: Decreased Total Hours of Sleep: 5 Vegetative Symptoms: None  ADLScreening [facility] Assessment Services) Patient's cognitive ability adequate to safely complete daily activities?: Yes Patient able to express need for assistance with ADLs?: Yes Independently performs ADLs?: Yes (appropriate for developmental age)  Prior Inpatient Therapy Prior Inpatient Therapy: Yes Prior Therapy Dates: 02/2012 & 05/2007 Prior Therapy Facilty/Provider(s): [facility] Reason for Treatment: Major Depression  Prior Outpatient  Therapy Prior Outpatient Therapy: Yes Prior Therapy Dates: 08/2014 to 06/2018 Prior Therapy Facilty/Provider(s): [facility] Reason for Treatment: Major Depression Does patient have an ACCT team?: No Does patient have Intensive In-House Services?  : No Does patient have [facility] services? : No Does patient have P4CC services?: No  ADL Screening (condition at time of admission) Patient's cognitive ability adequate to safely complete daily activities?: Yes Is the patient deaf or have difficulty hearing?: No Does the patient have difficulty seeing, even when wearing glasses/contacts?: No Does the patient have difficulty concentrating, remembering, or making decisions?: No Patient able to express need for assistance with ADLs?: Yes Does the patient have difficulty dressing or bathing?: No Independently performs ADLs?: Yes (appropriate for developmental age) Does the patient have difficulty walking or climbing stairs?: No Weakness of Legs: None Weakness of Arms/Hands: None  Home Assistive Devices/Equipment Home Assistive Devices/Equipment: None  Therapy Consults (therapy consults require a physician order) PT Evaluation Needed: No OT Evalulation Needed: No SLP Evaluation Needed: No Abuse/Neglect Assessment (Assessment to be complete while patient is alone) Abuse/Neglect Assessment Can Be Completed: Yes Physical Abuse: Denies Verbal Abuse: Denies Sexual Abuse: Denies Exploitation of patient/patient's resources: Denies Self-Neglect: Denies Values / Beliefs Cultural Requests During Hospitalization: None Spiritual Requests During Hospitalization: None Consults Spiritual Care Consult Needed: No Transition of Care Team Consult Needed: No Advance Directives (For Healthcare) Does Patient Have a Medical Advance Directive?: No  Disposition:  Per Psychiatric Nurse Practitioner Lindon Romp), Patient recommended for inpatient treatment.  Disposition Initial  Assessment Completed for this Encounter: Yes  On Site Evaluation by:   Reviewed with Physician:  Gunnar Fusi MS, LCAS, [facility], [facility] Therapeutic Triage Specialist 09/25/2019 2:31 AM

## 2019-09-25 NOTE — ED Provider Notes (Signed)
Patient is pending admit to psych unit at this hospital.  However patient does have chronic constipation no significant stool burden noted on CT. Plan provider psychiatry unit will not take patient until she has had a bowel movement.  Patient noted to have bowel movement.  Will plan to admit to psych.   Chelsey Starch, MD 09/25/19 [phone]

## 2019-09-25 NOTE — ED Notes (Signed)
Pt. Transferred to Ed from [facility] to room 24 H after screening for contraband. Report to include Situation, Background, Assessment and Recommendations from [facility]. Pt. Oriented to unit including Q15 minute rounds as well as the security cameras for their protection. Patient is alert and oriented, warm and dry in no acute distress. Patient denies SI, HI, and AVH. Pt. Encouraged to let me know if needs arise.

## 2019-09-25 NOTE — ED Notes (Signed)
Multiple laxatives provided, patient reports now having gas.

## 2019-09-25 NOTE — ED Notes (Signed)
TTS and psych at bedside to consult patient.

## 2019-09-25 NOTE — Consult Note (Signed)
[facility] Consult   Reason for Consult:  Intentional overdose Referring Physician:  Emergency department Physician  Patient Identification: Chelsey [name] MRN:  [id number] Principal Diagnosis: Major depressive disorder, recurrent severe without psychotic features ([place]) Diagnosis:  Principal Problem:   Major depressive disorder, recurrent severe without psychotic features ([place])  Total Time spent with patient: 45 minutes  Subjective:   Chelsey [name] is a 69 y.o. female patient admitted with suicide attempt via overdose on Trazadone.  Patient seen and evaluated by this provider. Patient endorses significant abdominal pain and stated she had not had a bowel movement in a long time. Patient said "I felt so much pain I wanted to end my life," which lead to the intentional overdose of Trazodone on Saturday (09/22/19).  Continues to have depression with suicidal ideations.  High level of depression which increased with her pain over the past week to the point of suicide attempt.  Continues to have abdominal pain after the overdose which the EDP is addressing at this time.  Depression increases with pain and decreases with physical comfort.  Constant throughout the day.  No homicidal ideations, hallucinations, substance abuse, or other psychiatric issues not noted above.  Endorses feelings of hopelessness, helplessness, and worthlessness.  HPI per [facility] Specialist, Ian Malkin:   Chelsey [name] is an 69 y.o. female who presents to the ER due to ingesting 56 Trazodone's, with the intentions of ending her life. She states she has had pain in her stomach for several months and she was unable to live with it any more. She felt the only way she was going to get any relief was to end her life. She took the medications Sunday (09/23/2019). After taking the medications, her stomach pain worsened. When she attempted to walk, she was unable to keep her balance. She started falling and having  increase feelings sickness. This continued till the time she arrived to the ER (09/24/2019).  During the interview, the patient was calm, cooperative and pleasant. She was able to provide appropriate answers to the questions. She denies history of violence and aggression. She denies the use of mind-altering substances and no involvement with the legal system. Throughout the interview, she denied HI and AV/H and continues to endorse SI.  Past Psychiatric History:  -MDD  Risk to Self: Suicidal Ideation: No Suicidal Intent: No Is patient at risk for suicide?: No Suicidal Plan?: No Access to Means: No What has been your use of drugs/alcohol within the last 12 months?: Reports of none How many times?: 0 Other Self Harm Risks: Reports of none Triggers for Past Attempts: None known Intentional Self Injurious Behavior: None Risk to Others: Homicidal Ideation: No Thoughts of Harm to Others: No Current Homicidal Intent: No Current Homicidal Plan: No Access to Homicidal Means: No Identified Victim: Reports of none History of harm to others?: No Assessment of Violence: None Noted Violent Behavior Description: Reports of none Does patient have access to weapons?: No Criminal Charges Pending?: No Does patient have a court date: No Prior Inpatient Therapy: Prior Inpatient Therapy: Yes Prior Therapy Dates: 02/2012 & 05/2007 Prior Therapy Facilty/Provider(s): [facility] Reason for Treatment: Major Depression Prior Outpatient Therapy: Prior Outpatient Therapy: Yes Prior Therapy Dates: 08/2014 to 06/2018 Prior Therapy Facilty/Provider(s): [facility] Reason for Treatment: Major Depression Does patient have an ACCT team?: No Does patient have Intensive In-House Services?  : No Does patient have [facility] services? : No Does patient have P4CC services?: No  Past Medical History:  Past Medical History:  Diagnosis Date  . Breast cancer ([place]) 1997   right breast, radiation  .  Bronchitis    recent/ 06/09/15 had chest xray/ [facility]/resolved  . Cancer [facility]) 1997   right lumpectomy,L/Ad/R   . GERD (gastroesophageal reflux disease)   . History of hiatal hernia   . Hypercholesteremia   . Hyperlipidemia   . Low BP    TYPICALLY RUNS 80'S/60'S  . Panic attack   . Personal history of malignant neoplasm of breast     Past Surgical History:  Procedure Laterality Date  . BICEPT TENODESIS Left 11/23/2016   Procedure: BICEPS TENODESIS;  Surgeon: Leim Fabry, MD;  Location: [facility];  Service: Orthopedics;  Laterality: Left;  . BREAST EXCISIONAL BIOPSY Right 1997   pos  . BREAST SURGERY Right 1997   lumpectomy  . CHOLECYSTECTOMY N/A 08/06/2016   Procedure: LAPAROSCOPIC CHOLECYSTECTOMY WITH INTRAOPERATIVE CHOLANGIOGRAM;  Surgeon: Christene Lye, MD;  Location: [facility];  Service: General;  Laterality: N/A;  . COLONOSCOPY  2008  . COLONOSCOPY WITH PROPOFOL N/A 07/21/2015   Procedure: COLONOSCOPY WITH PROPOFOL;  Surgeon: Lucilla Lame, MD;  Location: [place];  Service: Endoscopy;  Laterality: N/A;  . COLONOSCOPY WITH PROPOFOL N/A 01/05/2018   Procedure: COLONOSCOPY WITH PROPOFOL;  Surgeon: Jonathon Bellows, MD;  Location: [facility] ENDOSCOPY;  Service: Gastroenterology;  Laterality: N/A;  . ESOPHAGOGASTRODUODENOSCOPY (EGD) WITH PROPOFOL N/A 06/16/2016   Procedure: ESOPHAGOGASTRODUODENOSCOPY (EGD) WITH PROPOFOL;  Surgeon: Christene Lye, MD;  Location: [facility] ENDOSCOPY;  Service: Endoscopy;  Laterality: N/A;  . ESOPHAGOGASTRODUODENOSCOPY (EGD) WITH PROPOFOL N/A 01/05/2018   Procedure: ESOPHAGOGASTRODUODENOSCOPY (EGD) WITH PROPOFOL;  Surgeon: Jonathon Bellows, MD;  Location: [facility] ENDOSCOPY;  Service: Gastroenterology;  Laterality: N/A;  . ESOPHAGOGASTRODUODENOSCOPY (EGD) WITH PROPOFOL N/A 06/13/2018   Procedure: ESOPHAGOGASTRODUODENOSCOPY (EGD) WITH PROPOFOL;  Surgeon: Jonathon Bellows, MD;  Location: [facility] ENDOSCOPY;  Service: Gastroenterology;  Laterality: N/A;   . ESOPHAGOGASTRODUODENOSCOPY (EGD) WITH PROPOFOL N/A 07/16/2018   Procedure: ESOPHAGOGASTRODUODENOSCOPY (EGD) WITH PROPOFOL;  Surgeon: Lucilla Lame, MD;  Location: [facility] ENDOSCOPY;  Service: Endoscopy;  Laterality: N/A;  . HARDWARE REMOVAL Left 12/27/2016   Procedure: HARDWARE REMOVAL LEFT  PROXIMAL HUMEROUS;  Surgeon: Leim Fabry, MD;  Location: [facility];  Service: Orthopedics;  Laterality: Left;  . IR GJ TUBE CHANGE  04/21/2018  . NISSEN FUNDOPLICATION N/A 7/32/2025   Procedure: NISSEN FUNDOPLICATION;  Surgeon: Jules Husbands, MD;  Location: [facility];  Service: General;  Laterality: N/A;  . ORIF HUMERUS FRACTURE Left 11/23/2016   Procedure: OPEN REDUCTION INTERNAL FIXATION (ORIF) PROXIMAL HUMERUS FRACTURE;  Surgeon: Leim Fabry, MD;  Location: [facility];  Service: Orthopedics;  Laterality: Left;  . REPAIR OF ESOPHAGUS  03/09/2018   Procedure: REPAIR OF ESOPHAGUS;  Surgeon: Jules Husbands, MD;  Location: [facility];  Service: General;;  . REVERSE SHOULDER ARTHROPLASTY Left 12/27/2016   Procedure: REVERSE SHOULDER ARTHROPLASTY;  Surgeon: Leim Fabry, MD;  Location: [facility];  Service: Orthopedics;  Laterality: Left;  . ROBOTIC ASSISTED LAPAROSCOPIC REPAIR OF PARAESOPHAGEAL HERNIA N/A 03/09/2018   Procedure: ROBOTIC HIATAL HERNIA CONVERTED TO OPEN;  Surgeon: Jules Husbands, MD;  Location: [facility]S;  Service: General;  Laterality: N/A;   Family History:  Family History  Problem Relation Age of Onset  . Anxiety disorder Brother   . Obesity Brother   . COPD Brother   . Kidney disease Mother   . Heart attack Father   . Hypertension Father   . Alcohol abuse Father   . Depression  Brother   . Anxiety disorder Brother   . Breast cancer Maternal Grandmother    Family Psychiatric  History: See above. Social History:  Social History   Substance and Sexual Activity  Alcohol Use No  . Alcohol/week: 0.0 standard drinks     Social History   Substance and Sexual Activity  Drug Use No    Social  History   Socioeconomic History  . Marital status: Widowed    Spouse name: Not on file  . Number of children: 2  . Years of education: Not on file  . Highest education level: Bachelor's degree (e.g., BA, AB, BS)  Occupational History  . Not on file  Tobacco Use  . Smoking status: Current Some Day Smoker    Packs/day: 0.10    Years: 10.00    Pack years: 1.00    Types: Cigarettes    Start date: 11/25/2009  . Smokeless tobacco: Never Used  . Tobacco comment: 1 cigarette daily  Vaping Use  . Vaping Use: Never used  Substance and Sexual Activity  . Alcohol use: No    Alcohol/week: 0.0 standard drinks  . Drug use: No  . Sexual activity: Never  Other Topics Concern  . Not on file  Social History Narrative  . Not on file   Social Determinants of Health   Financial Resource Strain:   . Difficulty of Paying Living Expenses: Not on file  Food Insecurity:   . Worried About Charity fundraiser in the Last Year: Not on file  . Ran Out of Food in the Last Year: Not on file  Transportation Needs:   . Lack of Transportation (Medical): Not on file  . Lack of Transportation (Non-Medical): Not on file  Physical Activity:   . Days of Exercise per Week: Not on file  . Minutes of Exercise per Session: Not on file  Stress:   . Feeling of Stress : Not on file  Social Connections:   . Frequency of Communication with Friends and Family: Not on file  . Frequency of Social Gatherings with Friends and Family: Not on file  . Attends Religious Services: Not on file  . Active Member of Clubs or Organizations: Not on file  . Attends Archivist Meetings: Not on file  . Marital Status: Not on file   Additional Social History:    Allergies:  No Known Allergies  Labs:  Results for orders placed or performed during the hospital encounter of 09/24/19 (from the past 48 hour(s))  Basic metabolic panel     Status: Abnormal   Collection Time: 09/24/19  7:17 PM  Result Value Ref Range    Sodium 132 (L) 135 - 145 mmol/L   Potassium 4.2 3.5 - 5.1 mmol/L   Chloride 95 (L) 98 - 111 mmol/L   CO2 26 22 - 32 mmol/L   Glucose, Bld 97 70 - 99 mg/dL    Comment: Glucose reference range applies only to samples taken after fasting for at least 8 hours.   BUN 13 8 - 23 mg/dL   Creatinine, Ser 0.69 0.44 - 1.00 mg/dL   Calcium 8.6 (L) 8.9 - 10.3 mg/dL   GFR calc non Af Amer >60 >60 mL/[name]   GFR calc Af Amer >60 >60 mL/[name]   Anion gap 11 5 - 15    Comment: Performed at [facility], [address] [place], [place] [address]  CBC     Status: None   Collection Time: 09/24/19  7:17 PM  Result Value Ref Range   WBC 7.7 4.0 - 10.5 K/uL   RBC 4.12 3.87 - 5.11 MIL/uL   Hemoglobin 13.0 12.0 - 15.0 g/dL   HCT 38.3 36 - 46 %   MCV 93.0 80.0 - 100.0 fL   MCH 31.6 26.0 - 34.0 pg   MCHC 33.9 30.0 - 36.0 g/dL   RDW 12.5 11.5 - 15.5 %   Platelets 205 150 - 400 K/uL   nRBC 0.0 0.0 - 0.2 %    Comment: Performed at [facility], [address] [place], [place] [address]  Lipase, blood     Status: None   Collection Time: 09/24/19  7:17 PM  Result Value Ref Range   Lipase 19 11 - 51 U/L    Comment: Performed at [facility], [address] [place], [place] [address]  Hepatic function panel     Status: Abnormal   Collection Time: 09/24/19  7:17 PM  Result Value Ref Range   Total Protein 6.8 6.5 - 8.1 g/dL   Albumin 3.8 3.5 - 5.0 g/dL   AST 54 (H) 15 - 41 U/L   ALT 61 (H) 0 - 44 U/L   Alkaline Phosphatase 80 38 - 126 U/L   Total Bilirubin 1.9 (H) 0.3 - 1.2 mg/dL   Bilirubin, Direct 0.3 (H) 0.0 - 0.2 mg/dL   Indirect Bilirubin 1.6 (H) 0.3 - 0.9 mg/dL    Comment: Performed at [facility], [address] [place], [place] [address]  Ethanol     Status: None   Collection Time: 09/24/19  7:17 PM  Result Value Ref Range   Alcohol, Ethyl (B) <10 <10 mg/dL    Comment: (NOTE) Lowest detectable limit for serum alcohol is 10 mg/dL.  For medical purposes  only. Performed at [facility], [address] [place], [place] [address]   Acetaminophen level     Status: Abnormal   Collection Time: 09/24/19  7:17 PM  Result Value Ref Range   Acetaminophen (Tylenol), Serum <10 (L) 10 - 30 ug/mL    Comment: (NOTE) Therapeutic concentrations vary significantly. A range of 10-30 ug/mL  may be an effective concentration for many patients. However, some  are best treated at concentrations outside of this range. Acetaminophen concentrations >150 ug/mL at 4 hours after ingestion  and >50 ug/mL at 12 hours after ingestion are often associated with  toxic reactions.  Performed at [facility], [address] [place], [place] [address]   Salicylate level     Status: Abnormal   Collection Time: 09/24/19  7:17 PM  Result Value Ref Range   Salicylate Lvl <8.1 (L) 7.0 - 30.0 mg/dL    Comment: Performed at [facility], [address] [place], [place] [address]  Lactic acid, plasma     Status: None   Collection Time: 09/24/19  7:56 PM  Result Value Ref Range   Lactic Acid, Venous 1.2 0.5 - 1.9 mmol/L    Comment: Performed at [facility], [address] [place], [place] [address]  Urinalysis, Complete w Microscopic     Status: Abnormal   Collection Time: 09/24/19  9:25 PM  Result Value Ref Range   Color, Urine YELLOW (A) YELLOW   APPearance CLEAR (A) CLEAR   Specific Gravity, Urine 1.008 1.005 - 1.030   pH 6.0 5.0 - 8.0   Glucose, UA NEGATIVE NEGATIVE mg/dL   Hgb urine dipstick SMALL (A) NEGATIVE   Bilirubin Urine  NEGATIVE NEGATIVE   Ketones, ur 5 (A) NEGATIVE mg/dL   Protein, ur NEGATIVE NEGATIVE mg/dL   Nitrite NEGATIVE NEGATIVE   Leukocytes,Ua NEGATIVE NEGATIVE   WBC, UA 0-5 0 - 5 WBC/hpf   Bacteria, UA NONE SEEN NONE SEEN   Squamous Epithelial / LPF NONE SEEN 0 - 5    Comment: Performed at [facility], [address], [place], [place] [address]  Urine Drug Screen, Qualitative     Status:  None   Collection Time: 09/24/19  9:25 PM  Result Value Ref Range   Tricyclic, Ur Screen NONE DETECTED NONE DETECTED   Amphetamines, Ur Screen NONE DETECTED NONE DETECTED   MDMA (Ecstasy)Ur Screen NONE DETECTED NONE DETECTED   Cocaine Metabolite,Ur [place] NONE DETECTED NONE DETECTED   Opiate, Ur Screen NONE DETECTED NONE DETECTED   Phencyclidine (PCP) Ur S NONE DETECTED NONE DETECTED   Cannabinoid 50 Ng, Ur [place] NONE DETECTED NONE DETECTED   Barbiturates, Ur Screen NONE DETECTED NONE DETECTED   Benzodiazepine, Ur Scrn NONE DETECTED NONE DETECTED   Methadone Scn, Ur NONE DETECTED NONE DETECTED    Comment: (NOTE) Tricyclics + metabolites, urine    Cutoff 1000 ng/mL Amphetamines + metabolites, urine  Cutoff 1000 ng/mL MDMA (Ecstasy), urine              Cutoff 500 ng/mL Cocaine Metabolite, urine          Cutoff 300 ng/mL Opiate + metabolites, urine        Cutoff 300 ng/mL Phencyclidine (PCP), urine         Cutoff 25 ng/mL Cannabinoid, urine                 Cutoff 50 ng/mL Barbiturates + metabolites, urine  Cutoff 200 ng/mL Benzodiazepine, urine              Cutoff 200 ng/mL Methadone, urine                   Cutoff 300 ng/mL  The urine drug screen provides only a preliminary, unconfirmed analytical test result and should not be used for non-medical purposes. Clinical consideration and professional judgment should be applied to any positive drug screen result due to possible interfering substances. A more specific alternate chemical method must be used in order to obtain a confirmed analytical result. Gas chromatography / mass spectrometry (GC/MS) is the preferred confirm atory method. Performed at [facility], [address] [place], [place] [address]   SARS Coronavirus 2 by RT PCR (hospital order, performed in [facility] hospital lab) Nasopharyngeal Nasopharyngeal Swab     Status: None   Collection Time: 09/25/19  3:18 AM   Specimen: Nasopharyngeal Swab  Result Value Ref  Range   SARS Coronavirus 2 NEGATIVE NEGATIVE    Comment: (NOTE) SARS-CoV-2 target nucleic acids are NOT DETECTED.  The SARS-CoV-2 RNA is generally detectable in upper and lower respiratory specimens during the acute phase of infection. The lowest concentration of SARS-CoV-2 viral copies this assay can detect is 250 copies / mL. A negative result does not preclude SARS-CoV-2 infection and should not be used as the sole basis for treatment or other patient management decisions.  A negative result may occur with improper specimen collection / handling, submission of specimen other than nasopharyngeal swab, presence of viral mutation(s) within the areas targeted by this assay, and inadequate number of viral copies (<250 copies / mL). A negative result must be combined with clinical observations, patient history, and epidemiological  information.  Fact Sheet for Patients:   StrictlyIdeas.no  Fact Sheet for Healthcare Providers: BankingDealers.co.za  This test is not yet approved or  cleared by the [place] FDA and has been authorized for detection and/or diagnosis of SARS-CoV-2 by FDA under an Emergency Use Authorization (EUA).  This EUA will remain in effect (meaning this test can be used) for the duration of the COVID-19 declaration under Section 564(b)(1) of the Act, 21 U.S.C. section 360bbb-3(b)(1), unless the authorization is terminated or revoked sooner.  Performed at [facility], [address] [place], [place] [address]     Current Facility-Administered Medications  Medication Dose Route Frequency Provider Last Rate Last Admin  . iohexol (OMNIPAQUE) 9 MG/ML oral solution 500 mL  500 mL Oral BID PRN Arta Silence, MD   500 mL at 09/24/19 2003   Current Outpatient Medications  Medication Sig Dispense Refill  . linaclotide (LINZESS) 72 MCG capsule Take 1 capsule (72 mcg total) by mouth daily before  breakfast. 30 capsule 2  . omeprazole (PRILOSEC) 20 MG capsule Take 1 capsule (20 mg total) by mouth 2 (two) times daily before a meal. 180 capsule 3  . PARoxetine (PAXIL) 20 MG tablet Take 1 tablet (20 mg total) by mouth daily before supper. 90 tablet 1  . traZODone (DESYREL) 100 MG tablet Take 1 tablet (100 mg total) by mouth at bedtime. 90 tablet 1  . bisacodyl (DULCOLAX) 5 MG EC tablet Take 1 tablet (5 mg total) by mouth daily. (Patient not taking: Reported on 09/24/2019) 30 tablet 0  . busPIRone (BUSPAR) 10 MG tablet Take 1 tablet (10 mg total) by mouth 2 (two) times daily. (Patient not taking: Reported on 09/24/2019) 180 tablet 1  . polyethylene glycol (MIRALAX / GLYCOLAX) 17 g packet Take 17 g by mouth daily. 14 each 0  . Simethicone (GAS-X PO) Take 1-2 tablets by mouth daily as needed (gas).    . simvastatin (ZOCOR) 20 MG tablet TAKE ONE TABLET BY MOUTH AT BEDTIME (Patient not taking: Reported on 07/12/2019) 90 tablet 1    Musculoskeletal: Strength & Muscle Tone: within normal limits Gait & Station: Did not witness Patient leans: N/A  Psychiatric Specialty Exam: Physical Exam Vitals and nursing note reviewed.  HENT:     Head: Normocephalic.     Nose: Nose normal.  Eyes:     Conjunctiva/sclera: Conjunctivae normal.  Pulmonary:     Effort: Pulmonary effort is normal.  Musculoskeletal:     Cervical back: Normal range of motion.  Neurological:     General: No focal deficit present.     Mental Status: She is oriented to person, place, and time.  Psychiatric:        Attention and Perception: Attention and perception normal.        Mood and Affect: Mood is depressed. Affect is blunt.        Speech: Speech normal.        Behavior: Behavior is cooperative.        Thought Content: Thought content includes suicidal ideation. Thought content includes suicidal plan.        Cognition and Memory: Cognition normal.        Judgment: Judgment is inappropriate.     Review of Systems   Gastrointestinal: Positive for abdominal pain and constipation.  Psychiatric/Behavioral: Positive for dysphoric mood and suicidal ideas.  All other systems reviewed and are negative.   Blood pressure (!) 145/82, pulse 92, temperature 98.7 F (37.1 C), temperature source Oral,  resp. rate 16, height 5\' 5"  (1.651 m), weight 54.4 kg, SpO2 99 %.Body mass index is 19.97 kg/m.  General Appearance: Fairly Groomed  Eye Contact:  Good  Speech:  Clear and Coherent  Volume:  Normal  Mood:  Depressed  Affect:  Blunt  Thought Process:  Coherent  Orientation:  Full (Time, Place, and Person)  Thought Content:  Rumination  Suicidal Thoughts:  Yes.  with intent/plan  Homicidal Thoughts:  No  Memory:  Immediate;   Fair Recent;   Fair Remote;   Fair   Judgement:  Poor  Insight:  Lacking  Psychomotor Activity:  Normal  Concentration:  Concentration: Fair  Recall:  [employer] of Knowledge:  Fair  Language:  Good  Akathisia:  No  Handed:  Right  AIMS (if indicated):     Assets:  Resilience  ADL's:  Intact  Cognition:  WNL  Sleep:        Treatment Plan Summary: Daily contact with patient to assess and evaluate symptoms and progress in treatment, Medication management and Plan Major depressive disorder  -Continue Paxil 20 mg daily before supper  Insomnia: -Trazodone 100 mg at bedtime cooperative  Anxiety: -Buspirone 10 mg twice daily. -Continue Paxil 20 milligrams daily after supper  Disposition: Recommend psychiatric Inpatient admission when medically cleared.  Waylan Boga, NP 09/25/2019 1:04 PM

## 2019-09-25 NOTE — ED Provider Notes (Signed)
Patient with history of chronic constipation.  She is tried milk of magnesia GoLYTELY and Linzess without results.  We will try some mag citrate as psych asked that her constipation issue and chronic belly pain be resolved before she gets down there.  CT only shows moderate stool load.   Nena Polio, MD 09/25/19 [phone]

## 2019-09-25 NOTE — Plan of Care (Signed)
Patient new to the unit, hasn't had time to progress  Problem: Education: Goal: Knowledge of [facility] Education information/materials will improve Outcome: Not Progressing Goal: Emotional status will improve Outcome: Not Progressing Goal: Mental status will improve Outcome: Not Progressing Goal: Verbalization of understanding the information provided will improve Outcome: Not Progressing   Problem: Safety: Goal: Periods of time without injury will increase Outcome: Not Progressing   Problem: Education: Goal: Ability to make informed decisions regarding treatment will improve Outcome: Not Progressing   Problem: Self-Concept: Goal: Ability to disclose and discuss suicidal ideas will improve Outcome: Not Progressing Goal: Will verbalize positive feelings about self Outcome: Not Progressing

## 2019-09-25 NOTE — ED Notes (Signed)
[facility] called for report as per receiving nurse unable to take report, requested to call back after 8pm to give report.

## 2019-09-25 NOTE — ED Notes (Signed)
Assumed care of patient, patient calm and cooperative this morning. Sitting in recliner in hall. As per prior nurse patient sent from [place] to main due to her instability when ambulating. Will re-assess and continue to monitor. Patient denies SI/HV/SI this morning. Safety maintained. Awaiting psych re-eval and further plan of care.

## 2019-09-25 NOTE — ED Notes (Signed)
[facility] called and states they are ready for the pt.

## 2019-09-25 NOTE — ED Notes (Signed)
Called report to [facility]. Not able to bring pt downstairs until second nurse arrives to the unit. receiving RN to call this nurse back once able to bring pt.

## 2019-09-25 NOTE — BH Assessment (Signed)
Patient can come down tonight pending progress with bowel movements   Call to give report: [phone]  Patient is to be admitted to [facility] by Dr. Dwyane Dee.  Attending Physician will be. Dr. Shearon Stalls.   Patient has been assigned to room 310, by [place], RN.   Intake Paper Work has been signed and placed on patient chart.  ER staff is aware of the admission: 1. [place], ER Secretary  2. Cinda Quest, ER MD  3. Tomasa Hosteller Patient's Nurse  4. THO Patient Access.

## 2019-09-26 MED ORDER — CLONAZEPAM 0.25 MG PO TBDP
0.2500 mg | ORAL_TABLET | Freq: Two times a day (BID) | ORAL | Status: DC
  Administered 2019-09-26 – 2019-10-01 (×10): 0.25 mg via ORAL
  Filled 2019-09-26 (×10): qty 1

## 2019-09-26 MED ORDER — OLANZAPINE 2.5 MG PO TABS
1.2500 mg | ORAL_TABLET | Freq: Every day | ORAL | Status: DC
  Administered 2019-09-26: 1.25 mg via ORAL
  Filled 2019-09-26 (×2): qty 0.5

## 2019-09-26 MED ORDER — PAROXETINE HCL 20 MG PO TABS
40.0000 mg | ORAL_TABLET | Freq: Every day | ORAL | Status: DC
  Administered 2019-09-26 – 2019-10-05 (×10): 40 mg via ORAL
  Filled 2019-09-26 (×11): qty 2

## 2019-09-26 MED ORDER — MIRTAZAPINE 15 MG PO TABS
7.5000 mg | ORAL_TABLET | Freq: Every day | ORAL | Status: DC
  Administered 2019-09-26: 7.5 mg via ORAL
  Filled 2019-09-26: qty 1

## 2019-09-26 NOTE — H&P (Signed)
Psychiatric Admission Assessment Adult  Patient Identification: Chelsey [name] MRN:  [id number] Date of Evaluation:  09/26/2019 Chief Complaint:  Major depressive disorder, recurrent severe without psychotic features ([place]) [F33.2] Principal Diagnosis: <principal problem not specified> Diagnosis:  Active Problems:   Major depressive disorder, recurrent severe without psychotic features ([place])  History of Present Illness:  Major depression and somatic issues    Associated Signs/Symptoms:   Major depression, sad mood, lack of energy, motivation, enthusiasm, sleep, lack of concentration and focus, weight loss,  Active SI with plans ( took excess trazodone)----slow thinking, somatic and autonomic symptoms   Generalized anxiety ---anxious, worried, nervous tense frustrated ----dread fear doom and gloom on going ---  PTSD ---none mania and psychosis none       :  Total Time spent with patient: 40-50   Past Psychiatric History:   Is the patient at risk to self? Yes   Has the patient been a risk to self in the past 6 months? Yes.    Has the patient been a risk to self within the distant past? Yes.    Is the patient a risk to others? No.  Has the patient been a risk to others in the past 6 months? No.  Has the patient been a risk to others within the distant past? No.   Prior Inpatient Therapy:   Prior Outpatient Therapy:    Alcohol Screening: 1. How often do you have a drink containing alcohol?: Never 2. How many drinks containing alcohol do you have on a typical day when you are drinking?: 1 or 2 3. How often do you have six or more drinks on one occasion?: Never AUDIT-C Score: 0 4. How often during the last year have you found that you were not able to stop drinking once you had started?: Never 5. How often during the last year have you failed to do what was normally expected from you because of drinking?: Never 6. How often during the last year have you needed a first drink  in the morning to get yourself going after a heavy drinking session?: Never 7. How often during the last year have you had a feeling of guilt of remorse after drinking?: Never 8. How often during the last year have you been unable to remember what happened the night before because you had been drinking?: Never 9. Have you or someone else been injured as a result of your drinking?: No 10. Has a relative or friend or a doctor or another health worker been concerned about your drinking or suggested you cut down?: No Alcohol Use Disorder Identification Test Final Score (AUDIT): 0 Alcohol Brief Interventions/Follow-up: AUDIT Score <7 follow-up not indicated Substance Abuse History in the last 12 months:  No. Consequences of Substance Abuse: Negative Previous Psychotropic Medications: Yes  Psychological Evaluations: No  Past Medical History:  Past Medical History:  Diagnosis Date  . Breast cancer ([place]) 1997   right breast, radiation  . Bronchitis    recent/ 06/09/15 had chest xray/ [facility]/resolved  . Cancer [facility]) 1997   right lumpectomy,L/Ad/R   . GERD (gastroesophageal reflux disease)   . History of hiatal hernia   . Hypercholesteremia   . Hyperlipidemia   . Low BP    TYPICALLY RUNS 80'S/60'S  . Panic attack   . Personal history of malignant neoplasm of breast     Past Surgical History:  Procedure Laterality Date  . BICEPT TENODESIS Left 11/23/2016   Procedure: BICEPS TENODESIS;  Surgeon:  Leim Fabry, MD;  Location: [facility] ORS;  Service: Orthopedics;  Laterality: Left;  . BREAST EXCISIONAL BIOPSY Right 1997   pos  . BREAST SURGERY Right 1997   lumpectomy  . CHOLECYSTECTOMY N/A 08/06/2016   Procedure: LAPAROSCOPIC CHOLECYSTECTOMY WITH INTRAOPERATIVE CHOLANGIOGRAM;  Surgeon: Christene Lye, MD;  Location: [facility];  Service: General;  Laterality: N/A;  . COLONOSCOPY  2008  . COLONOSCOPY WITH PROPOFOL N/A 07/21/2015   Procedure: COLONOSCOPY WITH PROPOFOL;  Surgeon:  Lucilla Lame, MD;  Location: [place];  Service: Endoscopy;  Laterality: N/A;  . COLONOSCOPY WITH PROPOFOL N/A 01/05/2018   Procedure: COLONOSCOPY WITH PROPOFOL;  Surgeon: Jonathon Bellows, MD;  Location: [facility] ENDOSCOPY;  Service: Gastroenterology;  Laterality: N/A;  . ESOPHAGOGASTRODUODENOSCOPY (EGD) WITH PROPOFOL N/A 06/16/2016   Procedure: ESOPHAGOGASTRODUODENOSCOPY (EGD) WITH PROPOFOL;  Surgeon: Christene Lye, MD;  Location: [facility] ENDOSCOPY;  Service: Endoscopy;  Laterality: N/A;  . ESOPHAGOGASTRODUODENOSCOPY (EGD) WITH PROPOFOL N/A 01/05/2018   Procedure: ESOPHAGOGASTRODUODENOSCOPY (EGD) WITH PROPOFOL;  Surgeon: Jonathon Bellows, MD;  Location: [facility] ENDOSCOPY;  Service: Gastroenterology;  Laterality: N/A;  . ESOPHAGOGASTRODUODENOSCOPY (EGD) WITH PROPOFOL N/A 06/13/2018   Procedure: ESOPHAGOGASTRODUODENOSCOPY (EGD) WITH PROPOFOL;  Surgeon: Jonathon Bellows, MD;  Location: [facility] ENDOSCOPY;  Service: Gastroenterology;  Laterality: N/A;  . ESOPHAGOGASTRODUODENOSCOPY (EGD) WITH PROPOFOL N/A 07/16/2018   Procedure: ESOPHAGOGASTRODUODENOSCOPY (EGD) WITH PROPOFOL;  Surgeon: Lucilla Lame, MD;  Location: [facility];  Service: Endoscopy;  Laterality: N/A;  . HARDWARE REMOVAL Left 12/27/2016   Procedure: HARDWARE REMOVAL LEFT  PROXIMAL HUMEROUS;  Surgeon: Leim Fabry, MD;  Location: [facility];  Service: Orthopedics;  Laterality: Left;  . IR GJ TUBE CHANGE  04/21/2018  . NISSEN FUNDOPLICATION N/A 2/83/1517   Procedure: NISSEN FUNDOPLICATION;  Surgeon: Jules Husbands, MD;  Location: [facility];  Service: General;  Laterality: N/A;  . ORIF HUMERUS FRACTURE Left 11/23/2016   Procedure: OPEN REDUCTION INTERNAL FIXATION (ORIF) PROXIMAL HUMERUS FRACTURE;  Surgeon: Leim Fabry, MD;  Location: [facility];  Service: Orthopedics;  Laterality: Left;  . REPAIR OF ESOPHAGUS  03/09/2018   Procedure: REPAIR OF ESOPHAGUS;  Surgeon: Jules Husbands, MD;  Location: [facility];  Service: General;;  . REVERSE SHOULDER  ARTHROPLASTY Left 12/27/2016   Procedure: REVERSE SHOULDER ARTHROPLASTY;  Surgeon: Leim Fabry, MD;  Location: [facility];  Service: Orthopedics;  Laterality: Left;  . ROBOTIC ASSISTED LAPAROSCOPIC REPAIR OF PARAESOPHAGEAL HERNIA N/A 03/09/2018   Procedure: ROBOTIC HIATAL HERNIA CONVERTED TO OPEN;  Surgeon: Jules Husbands, MD;  Location: [facility];  Service: General;  Laterality: N/A;   Family History:  Family History  Problem Relation Age of Onset  . Anxiety disorder Brother   . Obesity Brother   . COPD Brother   . Kidney disease Mother   . Heart attack Father   . Hypertension Father   . Alcohol abuse Father   . Depression Brother   . Anxiety disorder Brother   . Breast cancer Maternal Grandmother    Family Psychiatric  History:  Parents with depression  Tobacco Screening: Have you used any form of tobacco in the last 30 days? (Cigarettes, Smokeless Tobacco, Cigars, and/or Pipes): Yes Tobacco use, Select all that apply: 5 or more cigarettes per day Are you interested in Tobacco Cessation Medications?: No, patient refused Counseled patient on smoking cessation including recognizing danger situations, developing coping skills and basic information about quitting provided: Refused/Declined practical counseling Social History:  Social History   Substance and Sexual Activity  Alcohol Use No  . Alcohol/week: 0.0 standard drinks  Social History   Substance and Sexual Activity  Drug Use No    Additional Social History:   Patient lives with son.  She is bored at home.  Somatic issues have increased is why she took the trazodone.  She is frustrated at having to drive him places  She has no fun, goals or activities   Not contracting for safety yet          Drug and ETOH history   None  Court and legal issues  None  Education --NO LD's ADHD  Finished HS  Social --retired from [employer], not collecting pension yet     Allergies:  No Known Allergies Lab Results:  Results  for orders placed or performed during the hospital encounter of 09/24/19 (from the past 48 hour(s))  Basic metabolic panel     Status: Abnormal   Collection Time: 09/24/19  7:17 PM  Result Value Ref Range   Sodium 132 (L) 135 - 145 mmol/L   Potassium 4.2 3.5 - 5.1 mmol/L   Chloride 95 (L) 98 - 111 mmol/L   CO2 26 22 - 32 mmol/L   Glucose, Bld 97 70 - 99 mg/dL    Comment: Glucose reference range applies only to samples taken after fasting for at least 8 hours.   BUN 13 8 - 23 mg/dL   Creatinine, Ser 0.69 0.44 - 1.00 mg/dL   Calcium 8.6 (L) 8.9 - 10.3 mg/dL   GFR calc non Af Amer >60 >60 mL/min   GFR calc Af Amer >60 >60 mL/min   Anion gap 11 5 - 15    Comment: Performed at [facility], [address] [place], [place] [address]  CBC     Status: None   Collection Time: 09/24/19  7:17 PM  Result Value Ref Range   WBC 7.7 4.0 - 10.5 K/uL   RBC 4.12 3.87 - 5.11 MIL/uL   Hemoglobin 13.0 12.0 - 15.0 g/dL   HCT 38.3 36 - 46 %   MCV 93.0 80.0 - 100.0 fL   MCH 31.6 26.0 - 34.0 pg   MCHC 33.9 30.0 - 36.0 g/dL   RDW 12.5 11.5 - 15.5 %   Platelets 205 150 - 400 K/uL   nRBC 0.0 0.0 - 0.2 %    Comment: Performed at [facility], [address] [place], [place] [address]  Lipase, blood     Status: None   Collection Time: 09/24/19  7:17 PM  Result Value Ref Range   Lipase 19 11 - 51 U/L    Comment: Performed at [facility], [address] [place], [place] [address]  Hepatic function panel     Status: Abnormal   Collection Time: 09/24/19  7:17 PM  Result Value Ref Range   Total Protein 6.8 6.5 - 8.1 g/dL   Albumin 3.8 3.5 - 5.0 g/dL   AST 54 (H) 15 - 41 U/L   ALT 61 (H) 0 - 44 U/L   Alkaline Phosphatase 80 38 - 126 U/L   Total Bilirubin 1.9 (H) 0.3 - 1.2 mg/dL   Bilirubin, Direct 0.3 (H) 0.0 - 0.2 mg/dL   Indirect Bilirubin 1.6 (H) 0.3 - 0.9 mg/dL    Comment: Performed at [facility], [address] [place], [place] [address]  Ethanol      Status: None   Collection Time: 09/24/19  7:17 PM  Result Value Ref Range   Alcohol, Ethyl (B) <10 <10 mg/dL  Comment: (NOTE) Lowest detectable limit for serum alcohol is 10 mg/dL.  For medical purposes only. Performed at [facility], [address] [place], [place] [address]   Acetaminophen level     Status: Abnormal   Collection Time: 09/24/19  7:17 PM  Result Value Ref Range   Acetaminophen (Tylenol), Serum <10 (L) 10 - 30 ug/mL    Comment: (NOTE) Therapeutic concentrations vary significantly. A range of 10-30 ug/mL  may be an effective concentration for many patients. However, some  are best treated at concentrations outside of this range. Acetaminophen concentrations >150 ug/mL at 4 hours after ingestion  and >50 ug/mL at 12 hours after ingestion are often associated with  toxic reactions.  Performed at [facility], [address] [place], [place] [address]   Salicylate level     Status: Abnormal   Collection Time: 09/24/19  7:17 PM  Result Value Ref Range   Salicylate Lvl <1.9 (L) 7.0 - 30.0 mg/dL    Comment: Performed at [facility], [address] [place], [place] [address]  Lactic acid, plasma     Status: None   Collection Time: 09/24/19  7:56 PM  Result Value Ref Range   Lactic Acid, Venous 1.2 0.5 - 1.9 mmol/L    Comment: Performed at [facility], [address] [place], [place] [address]  Urinalysis, Complete w Microscopic     Status: Abnormal   Collection Time: 09/24/19  9:25 PM  Result Value Ref Range   Color, Urine YELLOW (A) YELLOW   APPearance CLEAR (A) CLEAR   Specific Gravity, Urine 1.008 1.005 - 1.030   pH 6.0 5.0 - 8.0   Glucose, UA NEGATIVE NEGATIVE mg/dL   Hgb urine dipstick SMALL (A) NEGATIVE   Bilirubin Urine NEGATIVE NEGATIVE   Ketones, ur 5 (A) NEGATIVE mg/dL   Protein, ur NEGATIVE NEGATIVE mg/dL   Nitrite NEGATIVE NEGATIVE   Leukocytes,Ua NEGATIVE NEGATIVE   WBC, UA 0-5 0 - 5 WBC/hpf    Bacteria, UA NONE SEEN NONE SEEN   Squamous Epithelial / LPF NONE SEEN 0 - 5    Comment: Performed at [facility], [address] [place], [place] [address]  Urine Drug Screen, Qualitative     Status: None   Collection Time: 09/24/19  9:25 PM  Result Value Ref Range   Tricyclic, Ur Screen NONE DETECTED NONE DETECTED   Amphetamines, Ur Screen NONE DETECTED NONE DETECTED   MDMA (Ecstasy)Ur Screen NONE DETECTED NONE DETECTED   Cocaine Metabolite,Ur [place] NONE DETECTED NONE DETECTED   Opiate, Ur Screen NONE DETECTED NONE DETECTED   Phencyclidine (PCP) Ur S NONE DETECTED NONE DETECTED   Cannabinoid 50 Ng, Ur [place] NONE DETECTED NONE DETECTED   Barbiturates, Ur Screen NONE DETECTED NONE DETECTED   Benzodiazepine, Ur Scrn NONE DETECTED NONE DETECTED   Methadone Scn, Ur NONE DETECTED NONE DETECTED    Comment: (NOTE) Tricyclics + metabolites, urine    Cutoff 1000 ng/mL Amphetamines + metabolites, urine  Cutoff 1000 ng/mL MDMA (Ecstasy), urine              Cutoff 500 ng/mL Cocaine Metabolite, urine          Cutoff 300 ng/mL Opiate + metabolites, urine        Cutoff 300 ng/mL Phencyclidine (PCP), urine         Cutoff 25 ng/mL Cannabinoid, urine                 Cutoff 50 ng/mL  Barbiturates + metabolites, urine  Cutoff 200 ng/mL Benzodiazepine, urine              Cutoff 200 ng/mL Methadone, urine                   Cutoff 300 ng/mL  The urine drug screen provides only a preliminary, unconfirmed analytical test result and should not be used for non-medical purposes. Clinical consideration and professional judgment should be applied to any positive drug screen result due to possible interfering substances. A more specific alternate chemical method must be used in order to obtain a confirmed analytical result. Gas chromatography / mass spectrometry (GC/MS) is the preferred confirm atory method. Performed at [facility], [address] [place], [place] [address]   SARS  Coronavirus 2 by RT PCR (hospital order, performed in [facility] hospital lab) Nasopharyngeal Nasopharyngeal Swab     Status: None   Collection Time: 09/25/19  3:18 AM   Specimen: Nasopharyngeal Swab  Result Value Ref Range   SARS Coronavirus 2 NEGATIVE NEGATIVE    Comment: (NOTE) SARS-CoV-2 target nucleic acids are NOT DETECTED.  The SARS-CoV-2 RNA is generally detectable in upper and lower respiratory specimens during the acute phase of infection. The lowest concentration of SARS-CoV-2 viral copies this assay can detect is 250 copies / mL. A negative result does not preclude SARS-CoV-2 infection and should not be used as the sole basis for treatment or other patient management decisions.  A negative result may occur with improper specimen collection / handling, submission of specimen other than nasopharyngeal swab, presence of viral mutation(s) within the areas targeted by this assay, and inadequate number of viral copies (<250 copies / mL). A negative result must be combined with clinical observations, patient history, and epidemiological information.  Fact Sheet for Patients:   StrictlyIdeas.no  Fact Sheet for Healthcare Providers: BankingDealers.co.za  This test is not yet approved or  cleared by the [place] FDA and has been authorized for detection and/or diagnosis of SARS-CoV-2 by FDA under an Emergency Use Authorization (EUA).  This EUA will remain in effect (meaning this test can be used) for the duration of the COVID-19 declaration under Section 564(b)(1) of the Act, 21 U.S.C. section 360bbb-3(b)(1), unless the authorization is terminated or revoked sooner.  Performed at [facility], [address] [place], [place] [address]     Blood Alcohol level:  Lab Results  Component Value Date   [facility] <10 09/24/2019   ETH <10 58/85/0277    Metabolic Disorder Labs:  No results found for: HGBA1C, MPG No  results found for: PROLACTIN Lab Results  Component Value Date   CHOL 162 07/27/2017   TRIG 93 07/27/2017   HDL 56 07/27/2017   VLDL 15 01/05/2016   LDLCALC 87 07/27/2017   LDLCALC 89 07/05/2016    Current Medications: Current Facility-Administered Medications  Medication Dose Route Frequency Provider Last Rate Last Admin  . acetaminophen (TYLENOL) tablet 650 mg  650 mg Oral Q6H PRN Patrecia Pour, NP   650 mg at 09/26/19 0320  . alum & mag hydroxide-simeth (MAALOX/MYLANTA) 200-200-20 MG/5ML suspension 30 mL  30 mL Oral Q4H PRN Patrecia Pour, NP      . busPIRone (BUSPAR) tablet 10 mg  10 mg Oral BID Patrecia Pour, NP   10 mg at 09/26/19 0806  . clonazePAM (KLONOPIN) tablet 0.25 mg  0.25 mg Oral BID Eulas Post, MD      . iohexol (OMNIPAQUE) 9 MG/ML  oral solution 500 mL  500 mL Oral BID PRN Patrecia Pour, NP      . linaclotide Rolan Lipa) capsule 72 mcg  72 mcg Oral QAC breakfast Patrecia Pour, NP   72 mcg at 09/26/19 0911  . magnesium hydroxide (MILK OF MAGNESIA) suspension 30 mL  30 mL Oral Daily PRN Patrecia Pour, NP      . mirtazapine (REMERON) tablet 7.5 mg  7.5 mg Oral QHS Eulas Post, MD      . OLANZapine [facility]) tablet 1.25 mg  1.25 mg Oral QHS Eulas Post, MD      . pantoprazole (PROTONIX) EC tablet 40 mg  40 mg Oral Daily Patrecia Pour, NP   40 mg at 09/26/19 0806  . PARoxetine (PAXIL) tablet 40 mg  40 mg Oral QAC supper Eulas Post, MD      . polyethylene glycol (MIRALAX / GLYCOLAX) packet 17 g  17 g Oral Daily Waylan Boga Y, NP      . simvastatin (ZOCOR) tablet 20 mg  20 mg Oral QHS Patrecia Pour, NP   20 mg at 09/25/19 2210   PTA Medications: Medications Prior to Admission  Medication Sig Dispense Refill Last Dose  . bisacodyl (DULCOLAX) 5 MG EC tablet Take 1 tablet (5 mg total) by mouth daily. (Patient not taking: Reported on 09/24/2019) 30 tablet 0   . busPIRone (BUSPAR) 10 MG tablet Take 1 tablet (10 mg total) by mouth 2 (two) times  daily. (Patient not taking: Reported on 09/24/2019) 180 tablet 1   . linaclotide (LINZESS) 72 MCG capsule Take 1 capsule (72 mcg total) by mouth daily before breakfast. 30 capsule 2   . omeprazole (PRILOSEC) 20 MG capsule Take 1 capsule (20 mg total) by mouth 2 (two) times daily before a meal. 180 capsule 3   . PARoxetine (PAXIL) 20 MG tablet Take 1 tablet (20 mg total) by mouth daily before supper. 90 tablet 1   . polyethylene glycol (MIRALAX / GLYCOLAX) 17 g packet Take 17 g by mouth daily. 14 each 0   . Simethicone (GAS-X PO) Take 1-2 tablets by mouth daily as needed (gas).     . simvastatin (ZOCOR) 20 MG tablet TAKE ONE TABLET BY MOUTH AT BEDTIME (Patient not taking: Reported on 07/12/2019) 90 tablet 1   . traZODone (DESYREL) 100 MG tablet Take 1 tablet (100 mg total) by mouth at bedtime. 90 tablet 1     Musculoskeletal: Strength & Muscle Tone: feels weak at times  Gait & Station: slow to move  Patient leans: n/a   Handedness none Recall Okay Akathisia  None Cognition impaired Language english  Assets  Supportive family  Sleep impaired Aims not necessary       Psychiatric Specialty Exam: Physical Exam  Review of Systems  Blood pressure 126/75, pulse 82, temperature 97.8 F (36.6 C), temperature source Oral, resp. rate 18, height 5\' 5"  (1.651 m), weight 54.4 kg, SpO2 100 %.Body mass index is 19.96 kg/m.    Appearance  --haggard thin frail unkept forlorn Eye contact fair Rapport she is trying her best Mood depressed affect depressed Speech low tone volume fluency okay  Memory remote recent immediate normal Thought process and content --no psychosis or mania Judgement insight reliability fair to poor Fund of knowledge intelligence below average Abstraction somewhat concrete Movements no tics shakes tremors  SI and HI ---no HI --unclear safety margin at present  Consciousness not clouded or fluctuant Concentration and attention fair  Orientation ---times  four okay                                                         Treatment Plan Summary:   Caucasian female with ongoing major depression and generalized anxiety, with somatic problems, mainly stomach pain  Post SI and gesture without safety margins yet.    Needs med mgt daily MD rounds, CW interventions, discharge planning groups,  Milieu support possible family meeting   ESL 7 days        Observation Level/Precautions:  q 15   Laboratory:  TSH recheck   Psychotherapy:  Brief daily   Medications:    Changing   Consultations:    Discharge Concerns:    Estimated LOS:  7  Other:     Physician Treatment Plan for Primary Diagnosis: <principal problem not specified> Long Term Goal(s):    Long and short term goals for primary and secondary   diagnosis  Better coping skills, more in home support,   Get her pension ready  Help with [employer] health activities Service work [employer] support groups   Ongoing med followu p to help with maintenance therapy     I certify that inpatient services furnished can reasonably be expected to improve the patient's condition.    Eulas Post, MD 9/1/202111:59 AM

## 2019-09-26 NOTE — Plan of Care (Signed)
Pt denies anxiety, SI, HI and AVH. Pt rates depression 4/10. Pt was educated on care plan and verbalizes understanding. Pt was encouraged to attend groups. Collier Bullock RN Problem: Education: Goal: Freight forwarder Education information/materials will improve Outcome: Progressing Goal: Emotional status will improve Outcome: Progressing Goal: Mental status will improve Outcome: Progressing Goal: Verbalization of understanding the information provided will improve Outcome: Progressing   Problem: Safety: Goal: Periods of time without injury will increase Outcome: Progressing   Problem: Education: Goal: Ability to make informed decisions regarding treatment will improve Outcome: Progressing   Problem: Self-Concept: Goal: Ability to disclose and discuss suicidal ideas will improve Outcome: Progressing Goal: Will verbalize positive feelings about self Outcome: Progressing

## 2019-09-26 NOTE — Progress Notes (Signed)
Recreation Therapy Notes  Date: 09/26/2019  Time: 9:30 am   Location: Craft room     Behavioral response: N/A   Intervention Topic: Coping skills    Discussion/Intervention: Patient did not attend group.   Clinical Observations/Feedback:  Patient did not attend group.   Eshani Maestre LRT/CTRS       Izik Bingman 09/26/2019 11:19 AM

## 2019-09-26 NOTE — [facility] Suicide Risk Assessment (Signed)
[facility] INPATIENT:  Family/Significant Other Suicide Prevention Education  Suicide Prevention Education:  Contact Attempts: Kayton Ripp, son, [phone], has been identified by the patient as the family member/significant other with whom the patient will be residing, and identified as the person(s) who will aid the patient in the event of a mental health crisis.  With written consent from the patient, two attempts were made to provide suicide prevention education, prior to and/or following the patient's discharge.  We were unsuccessful in providing suicide prevention education.  A suicide education pamphlet was given to the patient to share with family/significant other.  Date and time of first attempt: 09/26/2019 12:45PM Date and time of second attempt: Second attempt is needed.  CSW left HIPAA compliant voicemail.   Rozann Lesches 09/26/2019, 12:44 PM

## 2019-09-26 NOTE — Progress Notes (Addendum)
Admission Note:  Patient is 69 yr old female admitted to the unit after intentional overdose on Trazodone. Patient reports that her stomach pains have been unbearable and is what initiated her overdose. Patient skin assessments notes multiple abrasions on both arms where she fell after taking the Trazadone. She also has bruise on Left hip and left knee where she had fallen.  Skin is otherwise intact and unremarkable. She denies SI/HI/AVH anxiety, but endorses depression and pain in stomach and from skin abrasions. No contraband found on person or in belongings. Patient was acclimated to the unit and provided medication to aid in sleep. She remains safe on the unit with 15 minute safety rounds and informed to contact staff with any concerns.    Cleo Butler-Nicholson, LPN

## 2019-09-26 NOTE — Progress Notes (Signed)
D- Patient alert and oriented. Affect/mood is withdrawn and sullen. Pt denies SI, HI, AVH, and pain. Pt said that she took the trazodone because she "wanted to kill myself".   A- Scheduled medications administered to patient, per MD orders. Support and encouragement provided.  Routine safety checks conducted every 15 minutes.  Patient informed to notify staff with problems or concerns.  R- No adverse drug reactions noted. Patient contracts for safety at this time. Patient compliant with medications and treatment plan. Patient receptive, calm, and cooperative. Patient interacts well with others on the unit.  Patient remains safe at this time.  Collier Bullock RN

## 2019-09-26 NOTE — Tx Team (Signed)
Initial Treatment Plan 09/26/2019 3:30 AM Chelsey [name] BVQ:[id number]    PATIENT STRESSORS: Health problems Other: Depression   PATIENT STRENGTHS: Capable of independent living Communication skills Motivation for treatment/growth   PATIENT IDENTIFIED PROBLEMS:                      DISCHARGE CRITERIA:  Ability to meet basic life and health needs Improved stabilization in mood, thinking, and/or behavior Motivation to continue treatment in a less acute level of care Verbal commitment to aftercare and medication compliance  PRELIMINARY DISCHARGE PLAN: Outpatient therapy Return to previous living arrangement  PATIENT/FAMILY INVOLVEMENT: This treatment plan has been presented to and reviewed with the patient, Chelsey [name].  The patient has been given the opportunity to ask questions and make suggestions.  Pranika Finks L Butler-Nicholson, LPN 8/0/0349, 1:79 AM

## 2019-09-26 NOTE — BHH Counselor (Signed)
Adult Comprehensive Assessment  Patient ID: Chelsey [name], female   DOB: 1950/06/12, 69 y.o.   MRN: [id number]  Information Source: Information source: Patient  Current Stressors:  Patient states their primary concerns and needs for treatment are:: "I took too many Trazodone, couldn't stand the pain that I am in.  In my stomach". Patient states their goals for this hospitilization and ongoing recovery are:: "I don't know" Educational / Learning stressors: Pt denies. Employment / Job issues: Pt denies. Family Relationships: Pt denies. Financial / Lack of resources (include bankruptcy): Pt denies. Housing / Lack of housing: Pt denies. Physical health (include injuries & life threatening diseases): Pt reports "pains in my stomach". Social relationships: Pt denies. Substance abuse: Pt denies. Bereavement / Loss: Pt denies.  Living/Environment/Situation:  Living Arrangements: Children Who else lives in the home?: Son What is atmosphere in current home: Comfortable  Family History:  Marital status: Widowed Does patient have children?: Yes How many children?: 2 How is patient's relationship with their children?: "It's good with one, the one that lives with me yells at me"  Childhood History:  By whom was/is the patient raised?: Both parents Description of patient's relationship with caregiver when they were a child: Pt reports "good" How were you disciplined when you got in trouble as a child/adolescent?: "I don't remember" Does patient have siblings?: Yes Number of Siblings: 2 Description of patient's current relationship with siblings: "it's all right"" Did patient suffer any verbal/emotional/physical/sexual abuse as a child?: No Did patient suffer from severe childhood neglect?: No Has patient ever been sexually abused/assaulted/raped as an adolescent or adult?: No Was the patient ever a victim of a crime or a disaster?: No Witnessed domestic violence?: No Has patient been  affected by domestic violence as an adult?: No  Education:  Highest grade of school patient has completed: "I got my college degree" Currently a student?: No Learning disability?: No  Employment/Work Situation:   Employment situation: Unemployed What is the longest time patient has a held a job?: "from [phone]" 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 If attempted suicide, did drugs/alcohol play a role in this?: No Alcohol/Substance Abuse Treatment Hx: Denies past history Has alcohol/substance abuse ever caused legal problems?: No  Social Support System:   Patient's Community Support System: Good Describe Community Support System: "my siseter and brother"  Leisure/Recreation:   Do You Have Hobbies?: No  Strengths/Needs:   What is the patient's perception of their strengths?: Pt denies. Patient states they can use these personal strengths during their treatment to contribute to their recovery: Pt denies. Patient states these barriers may affect/interfere with their treatment: Pt denies. Patient states these barriers may affect their return to the community: Pt denies.  Discharge Plan:   Currently receiving community mental health services: No Patient states concerns and preferences for aftercare planning are: Pt is hesitant to discuss aftercare at this time.  CSW will continue to assess. Patient states they will know when they are safe and ready for discharge when: "I really don't know.  I am just tired of being here". Does patient have access to transportation?: No Does patient have financial barriers related to discharge medications?: No Plan for no access to  transportation at  discharge: CSW will assist. Will patient be returning to same living situation after discharge?: Yes  Summary/Recommendations:   Summary and Recommendations (to be completed by the evaluator): Patient is a 69 year old female from [place], [place] [facility]).  She presents to the hospital following an intentional overdose on her Trazodone, she reports that she was "sick of living with this pain". Patient reports that she has had a pain in her stomach for several months, though she has spoken to several doctors including a gastroenterologist and her primary care physicians.  She has a primary diagnosis of Major Depressive Disorder, without psychotic features.  Recommendations include: crisis stabilization, therapeutic milieu, encourage group attendance and participation, medication management for detox/mood stabilization and development of comprehensive mental wellness/sobriety plan.  Rozann Lesches. 09/26/2019

## 2019-09-27 LAB — TSH: TSH: 3.761 u[IU]/mL (ref 0.350–4.500)

## 2019-09-27 MED ORDER — OLANZAPINE 5 MG PO TABS
2.5000 mg | ORAL_TABLET | Freq: Every day | ORAL | Status: DC
  Administered 2019-09-27: 2.5 mg via ORAL
  Filled 2019-09-27: qty 1

## 2019-09-27 MED ORDER — HYDROXYZINE HCL 50 MG PO TABS
50.0000 mg | ORAL_TABLET | Freq: Once | ORAL | Status: AC
  Administered 2019-09-27: 50 mg via ORAL
  Filled 2019-09-27: qty 1

## 2019-09-27 MED ORDER — TRAZODONE HCL 100 MG PO TABS
100.0000 mg | ORAL_TABLET | Freq: Every day | ORAL | Status: DC
  Filled 2019-09-27: qty 1

## 2019-09-27 NOTE — Progress Notes (Signed)
Patient is pleasant alert and oriented x4. She has been active and is engaging well with other peer on the unit. She denies SI/HI/AVH and anxiety at this encounter.  She does endorse pain and depression at this encounter. Patient states that her GI symptoms are still present, but have subsided a great deal since being admitted to the hospital. Today she complains of shoulder pain that she contributes to a fall she had prior to being admitted to the hospital and rates that pain at 7out10.  She received prescribed meds and tolerated without incident. She was informed to contact staff with any concerns she may have. She remains safe on the unit with 15 minute safety checks.    Cleo Butler-Nicholson, LPN

## 2019-09-27 NOTE — Progress Notes (Signed)
[facility] MD Progress Note  09/27/2019 1:24 PM ATIRA BORELLO  MRN:  [id number] Subjective:   I am still depressed and my stomach ---hurts still  Principal Problem:   Major Depression /severe recurrent  Dysthymia  Generalized anxiety  Adjustment disorder     Diagnosis: Active Problems:   Major depressive disorder, recurrent severe without psychotic features ([place])  Total Time spent with patient:   15-20      Past Psychiatric History: already discussed   Past Medical History:  Past Medical History:  Diagnosis Date  . Breast cancer ([place]) 1997   right breast, radiation  . Bronchitis    recent/ 06/09/15 had chest xray/ [facility]/resolved  . Cancer [facility]) 1997   right lumpectomy,L/Ad/R   . GERD (gastroesophageal reflux disease)   . History of hiatal hernia   . Hypercholesteremia   . Hyperlipidemia   . Low BP    TYPICALLY RUNS 80'S/60'S  . Panic attack   . Personal history of malignant neoplasm of breast     Past Surgical History:  Procedure Laterality Date  . BICEPT TENODESIS Left 11/23/2016   Procedure: BICEPS TENODESIS;  Surgeon: Leim Fabry, MD;  Location: [facility] ORS;  Service: Orthopedics;  Laterality: Left;  . BREAST EXCISIONAL BIOPSY Right 1997   pos  . BREAST SURGERY Right 1997   lumpectomy  . CHOLECYSTECTOMY N/A 08/06/2016   Procedure: LAPAROSCOPIC CHOLECYSTECTOMY WITH INTRAOPERATIVE CHOLANGIOGRAM;  Surgeon: Christene Lye, MD;  Location: [facility] ORS;  Service: General;  Laterality: N/A;  . COLONOSCOPY  2008  . COLONOSCOPY WITH PROPOFOL N/A 07/21/2015   Procedure: COLONOSCOPY WITH PROPOFOL;  Surgeon: Lucilla Lame, MD;  Location: [facility];  Service: Endoscopy;  Laterality: N/A;  . COLONOSCOPY WITH PROPOFOL N/A 01/05/2018   Procedure: COLONOSCOPY WITH PROPOFOL;  Surgeon: Jonathon Bellows, MD;  Location: [facility] ENDOSCOPY;  Service: Gastroenterology;  Laterality: N/A;  . ESOPHAGOGASTRODUODENOSCOPY (EGD) WITH PROPOFOL N/A 06/16/2016   Procedure:  ESOPHAGOGASTRODUODENOSCOPY (EGD) WITH PROPOFOL;  Surgeon: Christene Lye, MD;  Location: [facility] ENDOSCOPY;  Service: Endoscopy;  Laterality: N/A;  . ESOPHAGOGASTRODUODENOSCOPY (EGD) WITH PROPOFOL N/A 01/05/2018   Procedure: ESOPHAGOGASTRODUODENOSCOPY (EGD) WITH PROPOFOL;  Surgeon: Jonathon Bellows, MD;  Location: [facility]COPY;  Service: Gastroenterology;  Laterality: N/A;  . ESOPHAGOGASTRODUODENOSCOPY (EGD) WITH PROPOFOL N/A 06/13/2018   Procedure: ESOPHAGOGASTRODUODENOSCOPY (EGD) WITH PROPOFOL;  Surgeon: Jonathon Bellows, MD;  Location: [facility] ENDOSCOPY;  Service: Gastroenterology;  Laterality: N/A;  . ESOPHAGOGASTRODUODENOSCOPY (EGD) WITH PROPOFOL N/A 07/16/2018   Procedure: ESOPHAGOGASTRODUODENOSCOPY (EGD) WITH PROPOFOL;  Surgeon: Lucilla Lame, MD;  Location: [facility] ENDOSCOPY;  Service: Endoscopy;  Laterality: N/A;  . HARDWARE REMOVAL Left 12/27/2016   Procedure: HARDWARE REMOVAL LEFT  PROXIMAL HUMEROUS;  Surgeon: Leim Fabry, MD;  Location: [facility] ORS;  Service: Orthopedics;  Laterality: Left;  . IR GJ TUBE CHANGE  04/21/2018  . NISSEN FUNDOPLICATION N/A 6/37/8588   Procedure: NISSEN FUNDOPLICATION;  Surgeon: Jules Husbands, MD;  Location: [facility] ORS;  Service: General;  Laterality: N/A;  . ORIF HUMERUS FRACTURE Left 11/23/2016   Procedure: OPEN REDUCTION INTERNAL FIXATION (ORIF) PROXIMAL HUMERUS FRACTURE;  Surgeon: Leim Fabry, MD;  Location: [facility] OR[facility];  Service: Orthopedics;  Laterality: Left;  . REPAIR OF ESOPHAGUS  03/09/2018   Procedure: REPAIR OF ESOPHAGUS;  Surgeon: Jules Husbands, MD;  Location: [facility];  Service: General;;  . REVERSE SHOULDER ARTHROPLASTY Left 12/27/2016   Procedure: REVERSE SHOULDER ARTHROPLASTY;  Surgeon: Leim Fabry, MD;  Location: [facility];  Service: Orthopedics;  Laterality: Left;  . ROBOTIC ASSISTED LAPAROSCOPIC  REPAIR OF PARAESOPHAGEAL HERNIA N/A 03/09/2018   Procedure: ROBOTIC HIATAL HERNIA CONVERTED TO OPEN;  Surgeon: Jules Husbands, MD;  Location: [facility] ORS;  Service:  General;  Laterality: N/A;   Family History:  Family History  Problem Relation Age of Onset  . Anxiety disorder Brother   . Obesity Brother   . COPD Brother   . Kidney disease Mother   . Heart attack Father   . Hypertension Father   . Alcohol abuse Father   . Depression Brother   . Anxiety disorder Brother   . Breast cancer Maternal Grandmother    Family Psychiatric  History:  Social History:  Social History   Substance and Sexual Activity  Alcohol Use No  . Alcohol/week: 0.0 standard drinks     Social History   Substance and Sexual Activity  Drug Use No    Social History   Socioeconomic History  . Marital status: Widowed    Spouse name: Not on file  . Number of children: 2  . Years of education: Not on file  . Highest education level: Bachelor's degree (e.g., BA, AB, BS)  Occupational History  . Not on file  Tobacco Use  . Smoking status: Current Some Day Smoker    Packs/day: 0.10    Years: 10.00    Pack years: 1.00    Types: Cigarettes    Start date: 11/25/2009  . Smokeless tobacco: Never Used  . Tobacco comment: 1 cigarette daily  Vaping Use  . Vaping Use: Never used  Substance and Sexual Activity  . Alcohol use: No    Alcohol/week: 0.0 standard drinks  . Drug use: No  . Sexual activity: Never  Other Topics Concern  . Not on file  Social History Narrative  . Not on file   Social Determinants of Health   Financial Resource Strain:   . Difficulty of Paying Living Expenses: Not on file  Food Insecurity:   . Worried About Charity fundraiser in the Last Year: Not on file  . Ran Out of Food in the Last Year: Not on file  Transportation Needs:   . Lack of Transportation (Medical): Not on file  . Lack of Transportation (Non-Medical): Not on file  Physical Activity:   . Days of Exercise per Week: Not on file  . Minutes of Exercise per Session: Not on file  Stress:   . Feeling of Stress : Not on file  Social Connections:   . Frequency of  Communication with Friends and Family: Not on file  . Frequency of Social Gatherings with Friends and Family: Not on file  . Attends Religious Services: Not on file  . Active Member of Clubs or Organizations: Not on file  . Attends Archivist Meetings: Not on file  . Marital Status: Not on file   Additional Social History:       She ---is somewhat better but has stomach problems ---as well.  She was GI -followed ----but may need review   Again --will put in possible consult --                  Sleep: --on and off   Appetite:   None   Current Medications: Current Facility-Administered Medications  Medication Dose Route Frequency Provider Last Rate Last Admin  . acetaminophen (TYLENOL) tablet 650 mg  650 mg Oral Q6H PRN Patrecia Pour, NP   650 mg at 09/26/19 2114  . alum & mag hydroxide-simeth (MAALOX/MYLANTA) 200-200-20 MG/5ML  suspension 30 mL  30 mL Oral Q4H PRN Patrecia Pour, NP      . busPIRone (BUSPAR) tablet 10 mg  10 mg Oral BID Patrecia Pour, NP   10 mg at 09/27/19 0805  . clonazePAM (KLONOPIN) disintegrating tablet 0.25 mg  0.25 mg Oral BID Eulas Post, MD   0.25 mg at 09/27/19 0804  . iohexol (OMNIPAQUE) 9 MG/ML oral solution 500 mL  500 mL Oral BID PRN Patrecia Pour, NP      . linaclotide Rolan Lipa) capsule 72 mcg  72 mcg Oral QAC breakfast Patrecia Pour, NP   72 mcg at 09/27/19 0806  . magnesium hydroxide (MILK OF MAGNESIA) suspension 30 mL  30 mL Oral Daily PRN Patrecia Pour, NP      . OLANZapine (ZYPREXA) tablet 2.5 mg  2.5 mg Oral QHS Eulas Post, MD      . pantoprazole (PROTONIX) EC tablet 40 mg  40 mg Oral Daily Patrecia Pour, NP   40 mg at 09/27/19 0805  . PARoxetine (PAXIL) tablet 40 mg  40 mg Oral QAC supper Eulas Post, MD   40 mg at 09/26/19 1707  . polyethylene glycol (MIRALAX / GLYCOLAX) packet 17 g  17 g Oral Daily Patrecia Pour, NP   17 g at 09/27/19 0806  . simvastatin (ZOCOR) tablet 20 mg  20 mg Oral QHS Patrecia Pour, NP   20 mg at 09/26/19 2114    Lab Results:  Results for orders placed or performed during the hospital encounter of 09/25/19 (from the past 48 hour(s))  TSH     Status: None   Collection Time: 09/27/19  7:36 AM  Result Value Ref Range   TSH 3.761 0.350 - 4.500 uIU/mL    Comment: Performed by a 3rd Generation assay with a functional sensitivity of <=0.01 uIU/mL. Performed at [facility], [address] [place], [place] [address]     Blood Alcohol level:  Lab Results  Component Value Date   [facility] <10 09/24/2019   ETH <10 81/01/7508    Metabolic Disorder Labs: No results found for: HGBA1C, MPG No results found for: PROLACTIN Lab Results  Component Value Date   CHOL 162 07/27/2017   TRIG 93 07/27/2017   HDL 56 07/27/2017   VLDL 15 01/05/2016   LDLCALC 87 07/27/2017   LDLCALC 89 07/05/2016    Physical Findings:   AIMS:  , ,  ,  ,    None  CIWA:    COWS:     Musculoskeletal: Strength & Muscle Tone: thin and frail  Gait & Station: ---slow gait  Patient leans: ---n/a    Patient ---handedness not known Cognition  ---fair to poor with depression ADL's ---sparing Akathisia none Recall impaired at times Sleep ---on and off Assets  Supportive family  Psychomotor slowed         Psychiatric Specialty Exam: Physical Exam  Review of Systems  Blood pressure (!) 141/82, pulse 76, temperature 98.9 F (37.2 C), temperature source Oral, resp. rate 18, height 5\' 5"  (1.651 m), weight 54.4 kg, SpO2 100 %.Body mass index is 19.96 kg/m.    Mental Status   Oriented times four At times clouded Not processing Thin frail and gaunt Concentration and attention fair Mood and affect flat and depressed Psychosis and mania not present Fund of knowledge, judgement, insight reliability fair Unclear safety margin ---not clear if she would harm self if discahrged Appearance ----frail gaunt  Abstraction fair to  poor Much somatization / -- Movements --no  shakes and tremors                                                         Treatment Plan Summary:--continues with med mgt and overall inpatient structure      Eulas Post, MD 09/27/2019, 1:24 PM

## 2019-09-27 NOTE — Progress Notes (Signed)
D- Patient alert and oriented. Affect/mood is sullen and anxious. Pt denies SI, HI, AVH, and pain. Pt has attended groups.   A- Scheduled medications administered to patient, per MD orders. Support and encouragement provided.  Routine safety checks conducted every 15 minutes.  Patient informed to notify staff with problems or concerns.  R- No adverse drug reactions noted. Patient contracts for safety at this time. Patient compliant with medications and treatment plan. Patient receptive, calm, and cooperative. Patient interacts well with others on the unit.  Patient remains safe at this time.  Collier Bullock Rn

## 2019-09-27 NOTE — BHH Suicide Risk Assessment (Signed)
[facility] INPATIENT:  Family/Significant Other Suicide Prevention Education  Suicide Prevention Education:  Education Completed; Anahlia Iseminger, son, [phone] has been identified by the patient as the family member/significant other with whom the patient will be residing, and identified as the person(s) who will aid the patient in the event of a mental health crisis (suicidal ideations/suicide attempt).  With written consent from the patient, the family member/significant other has been provided the following suicide prevention education, prior to the and/or following the discharge of the patient.  The suicide prevention education provided includes the following:  Suicide risk factors  Suicide prevention and interventions  National Suicide Hotline telephone number  [facility] assessment telephone number  [facility] Emergency Assistance [place] and/or Residential Mobile Crisis Unit telephone number  Request made of family/significant other to:  Remove weapons (e.g., guns, rifles, knives), all items previously/currently identified as safety concern.    Remove drugs/medications (over-the-counter, prescriptions, illicit drugs), all items previously/currently identified as a safety concern.  The family member/significant other verbalizes understanding of the suicide prevention education information provided.  The family member/significant other agrees to remove the items of safety concern listed above.  He reports that he was unaware that patient was hospitalized.  He reports that patient may be a danger to herself more than others "due to possibly wanting to end it all".  He reports that patient does NOT have access to weapons at this time.    Rozann Lesches 09/27/2019, 12:53 PM

## 2019-09-27 NOTE — Tx Team (Addendum)
Interdisciplinary Treatment and Diagnostic Plan Update  09/27/2019 Time of Session: 9:30AM Chelsey [name] MRN: [id number]  Principal Diagnosis: <principal problem not specified>  Secondary Diagnoses: Active Problems:   Major depressive disorder, recurrent severe without psychotic features ([place])   Current Medications:  Current Facility-Administered Medications  Medication Dose Route Frequency Provider Last Rate Last Admin  . acetaminophen (TYLENOL) tablet 650 mg  650 mg Oral Q6H PRN Patrecia Pour, NP   650 mg at 09/26/19 2114  . alum & mag hydroxide-simeth (MAALOX/MYLANTA) 200-200-20 MG/5ML suspension 30 mL  30 mL Oral Q4H PRN Patrecia Pour, NP      . busPIRone (BUSPAR) tablet 10 mg  10 mg Oral BID Patrecia Pour, NP   10 mg at 09/27/19 0805  . clonazePAM (KLONOPIN) disintegrating tablet 0.25 mg  0.25 mg Oral BID Eulas Post, MD   0.25 mg at 09/27/19 0804  . iohexol (OMNIPAQUE) 9 MG/ML oral solution 500 mL  500 mL Oral BID PRN Patrecia Pour, NP      . linaclotide Rolan Lipa) capsule 72 mcg  72 mcg Oral QAC breakfast Patrecia Pour, NP   72 mcg at 09/27/19 0806  . magnesium hydroxide (MILK OF MAGNESIA) suspension 30 mL  30 mL Oral Daily PRN Patrecia Pour, NP      . OLANZapine (ZYPREXA) tablet 2.5 mg  2.5 mg Oral QHS Eulas Post, MD      . pantoprazole (PROTONIX) EC tablet 40 mg  40 mg Oral Daily Patrecia Pour, NP   40 mg at 09/27/19 0805  . PARoxetine (PAXIL) tablet 40 mg  40 mg Oral QAC supper Eulas Post, MD   40 mg at 09/26/19 1707  . polyethylene glycol (MIRALAX / GLYCOLAX) packet 17 g  17 g Oral Daily Patrecia Pour, NP   17 g at 09/27/19 0806  . simvastatin (ZOCOR) tablet 20 mg  20 mg Oral QHS Patrecia Pour, NP   20 mg at 09/26/19 2114   PTA Medications: Medications Prior to Admission  Medication Sig Dispense Refill Last Dose  . bisacodyl (DULCOLAX) 5 MG EC tablet Take 1 tablet (5 mg total) by mouth daily. (Patient not taking: Reported on 09/24/2019) 30  tablet 0   . busPIRone (BUSPAR) 10 MG tablet Take 1 tablet (10 mg total) by mouth 2 (two) times daily. (Patient not taking: Reported on 09/24/2019) 180 tablet 1   . linaclotide (LINZESS) 72 MCG capsule Take 1 capsule (72 mcg total) by mouth daily before breakfast. 30 capsule 2   . omeprazole (PRILOSEC) 20 MG capsule Take 1 capsule (20 mg total) by mouth 2 (two) times daily before a meal. 180 capsule 3   . PARoxetine (PAXIL) 20 MG tablet Take 1 tablet (20 mg total) by mouth daily before supper. 90 tablet 1   . polyethylene glycol (MIRALAX / GLYCOLAX) 17 g packet Take 17 g by mouth daily. 14 each 0   . Simethicone (GAS-X PO) Take 1-2 tablets by mouth daily as needed (gas).     . simvastatin (ZOCOR) 20 MG tablet TAKE ONE TABLET BY MOUTH AT BEDTIME (Patient not taking: Reported on 07/12/2019) 90 tablet 1   . traZODone (DESYREL) 100 MG tablet Take 1 tablet (100 mg total) by mouth at bedtime. 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   Physician Treatment Plan for Primary Diagnosis: <principal problem not specified> Long Term Goal(s):     Short Term Goals:    Medication Management: Evaluate patient's response, side effects, and tolerance of medication regimen.  Therapeutic Interventions: 1 to 1 sessions, Unit Group sessions and Medication administration.  Evaluation of Outcomes: Not Progressing  Physician Treatment Plan for Secondary Diagnosis: Active Problems:   Major depressive disorder, recurrent severe without psychotic features ([place])  Long Term Goal(s):     Short Term Goals:       Medication Management: Evaluate patient's response, side effects, and tolerance of medication regimen.  Therapeutic  Interventions: 1 to 1 sessions, Unit Group sessions and Medication administration.  Evaluation of Outcomes: Not Progressing   RN Treatment Plan for Primary Diagnosis: <principal problem not specified> Long Term Goal(s): Knowledge of disease and therapeutic regimen to maintain health will improve  Short Term Goals: Ability to demonstrate self-control, Ability to participate in decision making will improve, Ability to verbalize feelings will improve, Ability to disclose and discuss suicidal ideas, Ability to identify and develop effective coping behaviors will improve and Compliance with prescribed medications will improve  Medication Management: RN will administer medications as ordered by provider, will assess and evaluate patient's response and provide education to patient for prescribed medication. RN will report any adverse and/or side effects to prescribing provider.  Therapeutic Interventions: 1 on 1 counseling sessions, Psychoeducation, Medication administration, Evaluate responses to treatment, Monitor vital signs and CBGs as ordered, Perform/monitor CIWA, COWS, AIMS and Fall Risk screenings as ordered, Perform wound care treatments as ordered.  Evaluation of Outcomes: Not Progressing   LCSW Treatment Plan for Primary Diagnosis: <principal problem not specified> Long Term Goal(s): Safe transition to appropriate next level of care at discharge, Engage patient in therapeutic group addressing interpersonal concerns.  Short Term Goals: Engage patient in aftercare planning with referrals and resources, Increase social support, Increase ability to appropriately verbalize feelings, Increase emotional regulation, Facilitate acceptance of mental health diagnosis and concerns and Increase skills for wellness and recovery  Therapeutic Interventions: Assess for all discharge needs, 1 to 1 time with Social worker, Explore available resources and support systems, Assess for adequacy in community  support network, Educate family and significant other(s) on suicide prevention, Complete Psychosocial Assessment, Interpersonal group therapy.  Evaluation of Outcomes: Not Progressing   Progress in Treatment: Attending groups: Yes. Participating in groups: Yes. Taking medication as prescribed: Yes. Toleration medication: Yes. Family/Significant other contact made: Yes, individual(s) contacted:  SPE completed with the patient's son.   Patient understands diagnosis: Yes. Discussing patient identified problems/goals with staff: Yes. Medical problems stabilized or resolved: Yes. Denies suicidal/homicidal ideation: Yes. Issues/concerns per patient self-inventory: No. Other: none  New problem(s) identified: No, Describe:  none  New Short Term/Long Term Goal(s): medication management for mood stabilization; elimination of SI thoughts; development of comprehensive mental wellness/sobriety plan.  Patient Goals:   "get my medication right, stop hurting all day and get some energy"  Discharge Plan or Barriers: Patient reports plans to return to her home. She has been hesitant at this time to agree to aftercare plans.   Reason for Continuation of Hospitalization: Anxiety Depression Medication stabilization Suicidal ideation  Estimated Length of Stay:  1-7 days  Recreational Therapy: Patient: N/A Patient Goal: Patient will engage in groups without prompting or encouragement from LRT x3 group sessions within 5 recreation therapy group sessions  Attendees: Patient: Chelsey [name] 09/27/2019 3:38 PM  Physician: Dr. Janese Banks, MD  09/27/2019 3:38 PM  Nursing: Collier Bullock, RN 09/27/2019 3:38 PM  RN Care Manager: 09/27/2019 3:38 PM  Social Worker: Assunta Curtis, LCSW 09/27/2019 3:38 PM  Recreational Therapist: Devin Going, LRT 09/27/2019 3:38 PM  Other:  09/27/2019 3:38 PM  Other:  09/27/2019 3:38 PM  Other: 09/27/2019 3:38 PM    Scribe for Treatment Team: Rozann Lesches,  LCSW 09/27/2019 3:38 PM

## 2019-09-27 NOTE — Plan of Care (Addendum)
Pt rates depression and anxiety both 8/10. Pt denies HI and AVH. Pt has passive SI with no plan but contracts for safety. Collier Bullock RN Problem: Education: Goal: Freight forwarder Education information/materials will improve Outcome: Progressing Goal: Emotional status will improve Outcome: Progressing Goal: Mental status will improve Outcome: Progressing Goal: Verbalization of understanding the information provided will improve Outcome: Progressing   Problem: Safety: Goal: Periods of time without injury will increase Outcome: Progressing   Problem: Education: Goal: Ability to make informed decisions regarding treatment will improve Outcome: Progressing   Problem: Self-Concept: Goal: Ability to disclose and discuss suicidal ideas will improve Outcome: Progressing Goal: Will verbalize positive feelings about self Outcome: Progressing
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
## 2019-09-27 NOTE — Progress Notes (Signed)
BRIEF PHARMACY NOTE   This patient attended and participated in Medication Management Group counseling led by [facility] staff pharmacist.  This interactive class reviews basic information about prescription medications and education on personal responsibility in medication management.  The class also includes general knowledge of 3 main classes of behavioral medications, including antipsychotics, antidepressants, and mood stabilizers.     Patient behavior was appropriate for group setting.   Educational materials sourced from:  "Medication Do's and Don'ts" from [email or web address]   "Mental Health Medications" from [email or web address]#part [place] ,PharmD 09/27/2019 , 3:52 PM

## 2019-09-27 NOTE — Progress Notes (Signed)
Recreation Therapy Notes  Date: 09/27/2019  Time: 10:30 am   Location: Craft room     Behavioral response: N/A   Intervention Topic: Leisure   Discussion/Intervention: Patient did not attend group.   Clinical Observations/Feedback:  Patient did not attend group.   Shelie Lansing LRT/CTRS        Cannon Quinton 09/27/2019 1:05 PM

## 2019-09-27 NOTE — Plan of Care (Signed)
  Problem: Education: Goal: Knowledge of [facility] Education information/materials will improve Outcome: Progressing Goal: Emotional status will improve Outcome: Progressing Goal: Mental status will improve Outcome: Progressing Goal: Verbalization of understanding the information provided will improve Outcome: Progressing   Problem: Safety: Goal: Periods of time without injury will increase Outcome: Progressing   Problem: Education: Goal: Ability to make informed decisions regarding treatment will improve Outcome: Progressing   Problem: Self-Concept: Goal: Ability to disclose and discuss suicidal ideas will improve Outcome: Progressing Goal: Will verbalize positive feelings about self Outcome: Progressing   

## 2019-09-28 MED ORDER — LINACLOTIDE 72 MCG PO CAPS
72.0000 ug | ORAL_CAPSULE | Freq: Every day | ORAL | Status: DC
  Administered 2019-09-29 – 2019-10-15 (×15): 72 ug via ORAL
  Filled 2019-09-28 (×19): qty 1

## 2019-09-28 MED ORDER — SUCRALFATE 1 GM/10ML PO SUSP
1.0000 g | Freq: Two times a day (BID) | ORAL | Status: DC
  Administered 2019-09-28 – 2019-10-01 (×6): 1 g via ORAL
  Filled 2019-09-28 (×7): qty 10

## 2019-09-28 MED ORDER — DOXEPIN HCL 10 MG PO CAPS
10.0000 mg | ORAL_CAPSULE | Freq: Every day | ORAL | Status: DC
  Administered 2019-09-28: 10 mg via ORAL
  Filled 2019-09-28 (×2): qty 1

## 2019-09-28 MED ORDER — OLANZAPINE 5 MG PO TABS
5.0000 mg | ORAL_TABLET | Freq: Every day | ORAL | Status: DC
  Administered 2019-09-28 – 2019-10-01 (×4): 5 mg via ORAL
  Filled 2019-09-28 (×4): qty 1

## 2019-09-28 NOTE — Progress Notes (Signed)
[facility] MD Progress Note  09/28/2019 3:12 PM ELYSABETH [name]  MRN:  [id number] Subjective:   Still tired and have stomach pains chronic  Principal Problem: <principal problem not specified> Diagnosis: Active Problems:   Major depressive disorder, recurrent severe without psychotic features ([place]) generalized anxiety  Adjustment problems  Total Time spent with patient: 15-20  Past Psychiatric History: already noted   Past Medical History:  Past Medical History:  Diagnosis Date  . Breast cancer ([place]) 1997   right breast, radiation  . Bronchitis    recent/ 06/09/15 had chest xray/ [facility]/resolved  . Cancer [facility]) 1997   right lumpectomy,L/Ad/R   . GERD (gastroesophageal reflux disease)   . History of hiatal hernia   . Hypercholesteremia   . Hyperlipidemia   . Low BP    TYPICALLY RUNS 80'S/60'S  . Panic attack   . Personal history of malignant neoplasm of breast     Past Surgical History:  Procedure Laterality Date  . BICEPT TENODESIS Left 11/23/2016   Procedure: BICEPS TENODESIS;  Surgeon: Leim Fabry, MD;  Location: [facility] ORS;  Service: Orthopedics;  Laterality: Left;  . BREAST EXCISIONAL BIOPSY Right 1997   pos  . BREAST SURGERY Right 1997   lumpectomy  . CHOLECYSTECTOMY N/A 08/06/2016   Procedure: LAPAROSCOPIC CHOLECYSTECTOMY WITH INTRAOPERATIVE CHOLANGIOGRAM;  Surgeon: Christene Lye, MD;  Location: [facility] ORS;  Service: General;  Laterality: N/A;  . COLONOSCOPY  2008  . COLONOSCOPY WITH PROPOFOL N/A 07/21/2015   Procedure: COLONOSCOPY WITH PROPOFOL;  Surgeon: Lucilla Lame, MD;  Location: [place];  Service: Endoscopy;  Laterality: N/A;  . COLONOSCOPY WITH PROPOFOL N/A 01/05/2018   Procedure: COLONOSCOPY WITH PROPOFOL;  Surgeon: Jonathon Bellows, MD;  Location: [facility] ENDOSCOPY;  Service: Gastroenterology;  Laterality: N/A;  . ESOPHAGOGASTRODUODENOSCOPY (EGD) WITH PROPOFOL N/A 06/16/2016   Procedure: ESOPHAGOGASTRODUODENOSCOPY (EGD) WITH PROPOFOL;  Surgeon:  Christene Lye, MD;  Location: [facility] ENDOSCOPY;  Service: Endoscopy;  Laterality: N/A;  . ESOPHAGOGASTRODUODENOSCOPY (EGD) WITH PROPOFOL N/A 01/05/2018   Procedure: ESOPHAGOGASTRODUODENOSCOPY (EGD) WITH PROPOFOL;  Surgeon: Jonathon Bellows, MD;  Location: [facility] ENDOSCOPY;  Service: Gastroenterology;  Laterality: N/A;  . ESOPHAGOGASTRODUODENOSCOPY (EGD) WITH PROPOFOL N/A 06/13/2018   Procedure: ESOPHAGOGASTRODUODENOSCOPY (EGD) WITH PROPOFOL;  Surgeon: Jonathon Bellows, MD;  Location: [facility] ENDOSCOPY;  Service: Gastroenterology;  Laterality: N/A;  . ESOPHAGOGASTRODUODENOSCOPY (EGD) WITH PROPOFOL N/A 07/16/2018   Procedure: ESOPHAGOGASTRODUODENOSCOPY (EGD) WITH PROPOFOL;  Surgeon: Lucilla Lame, MD;  Location: [facility] ENDOSCOPY;  Service: Endoscopy;  Laterality: N/A;  . HARDWARE REMOVAL Left 12/27/2016   Procedure: HARDWARE REMOVAL LEFT  PROXIMAL HUMEROUS;  Surgeon: Leim Fabry, MD;  Location: [facility] ORS;  Service: Orthopedics;  Laterality: Left;  . IR GJ TUBE CHANGE  04/21/2018  . NISSEN FUNDOPLICATION N/A 10/18/2681   Procedure: NISSEN FUNDOPLICATION;  Surgeon: Jules Husbands, MD;  Location: [facility] ORS;  Service: General;  Laterality: N/A;  . ORIF HUMERUS FRACTURE Left 11/23/2016   Procedure: OPEN REDUCTION INTERNAL FIXATION (ORIF) PROXIMAL HUMERUS FRACTURE;  Surgeon: Leim Fabry, MD;  Location: [facility] ORS;  Service: Orthopedics;  Laterality: Left;  . REPAIR OF ESOPHAGUS  03/09/2018   Procedure: REPAIR OF ESOPHAGUS;  Surgeon: Jules Husbands, MD;  Location: [facility] ORS;  Service: General;;  . REVERSE SHOULDER ARTHROPLASTY Left 12/27/2016   Procedure: REVERSE SHOULDER ARTHROPLASTY;  Surgeon: Leim Fabry, MD;  Location: [facility] ORS;  Service: Orthopedics;  Laterality: Left;  . ROBOTIC ASSISTED LAPAROSCOPIC REPAIR OF PARAESOPHAGEAL HERNIA N/A 03/09/2018   Procedure: ROBOTIC HIATAL HERNIA CONVERTED TO OPEN;  Surgeon:  Pabon, Marjory Lies, MD;  Location: [facility] ORS;  Service: General;  Laterality: N/A;   Family History:  Family  History  Problem Relation Age of Onset  . Anxiety disorder Brother   . Obesity Brother   . COPD Brother   . Kidney disease Mother   . Heart attack Father   . Hypertension Father   . Alcohol abuse Father   . Depression Brother   . Anxiety disorder Brother   . Breast cancer Maternal Grandmother    Family Psychiatric  History: already noted  Social History:  Social History   Substance and Sexual Activity  Alcohol Use No  . Alcohol/week: 0.0 standard drinks     Social History   Substance and Sexual Activity  Drug Use No    Social History   Socioeconomic History  . Marital status: Widowed    Spouse name: Not on file  . Number of children: 2  . Years of education: Not on file  . Highest education level: Bachelor's degree (e.g., BA, AB, BS)  Occupational History  . Not on file  Tobacco Use  . Smoking status: Current Some Day Smoker    Packs/day: 0.10    Years: 10.00    Pack years: 1.00    Types: Cigarettes    Start date: 11/25/2009  . Smokeless tobacco: Never Used  . Tobacco comment: 1 cigarette daily  Vaping Use  . Vaping Use: Never used  Substance and Sexual Activity  . Alcohol use: No    Alcohol/week: 0.0 standard drinks  . Drug use: No  . Sexual activity: Never  Other Topics Concern  . Not on file  Social History Narrative  . Not on file   Social Determinants of Health   Financial Resource Strain:   . Difficulty of Paying Living Expenses: Not on file  Food Insecurity:   . Worried About Charity fundraiser in the Last Year: Not on file  . Ran Out of Food in the Last Year: Not on file  Transportation Needs:   . Lack of Transportation (Medical): Not on file  . Lack of Transportation (Non-Medical): Not on file  Physical Activity:   . Days of Exercise per Week: Not on file  . Minutes of Exercise per Session: Not on file  Stress:   . Feeling of Stress : Not on file  Social Connections:   . Frequency of Communication with Friends and Family: Not on file   . Frequency of Social Gatherings with Friends and Family: Not on file  . Attends Religious Services: Not on file  . Active Member of Clubs or Organizations: Not on file  . Attends Archivist Meetings: Not on file  . Marital Status: Not on file   Additional Social History:     Nothing new today                     Sleep: adjustment meds at night   Appetite:  Improving in general    Current Medications: Current Facility-Administered Medications  Medication Dose Route Frequency Provider Last Rate Last Admin  . acetaminophen (TYLENOL) tablet 650 mg  650 mg Oral Q6H PRN Patrecia Pour, NP   650 mg at 09/27/19 2212  . alum & mag hydroxide-simeth (MAALOX/MYLANTA) 200-200-20 MG/5ML suspension 30 mL  30 mL Oral Q4H PRN Patrecia Pour, NP      . busPIRone (BUSPAR) tablet 10 mg  10 mg Oral BID Patrecia Pour, NP   10  mg at 09/28/19 0842  . clonazePAM (KLONOPIN) disintegrating tablet 0.25 mg  0.25 mg Oral BID Eulas Post, MD   0.25 mg at 09/28/19 0842  . doxepin (SINEQUAN) capsule 10 mg  10 mg Oral QHS Eulas Post, MD      . iohexol (OMNIPAQUE) 9 MG/ML oral solution 500 mL  500 mL Oral BID PRN Patrecia Pour, NP      . linaclotide Rolan Lipa) capsule 72 mcg  72 mcg Oral QAC breakfast Patrecia Pour, NP   72 mcg at 09/28/19 [id number]  . magnesium hydroxide (MILK OF MAGNESIA) suspension 30 mL  30 mL Oral Daily PRN Patrecia Pour, NP      . OLANZapine (ZYPREXA) tablet 5 mg  5 mg Oral QHS Eulas Post, MD      . pantoprazole (PROTONIX) EC tablet 40 mg  40 mg Oral Daily Patrecia Pour, NP   40 mg at 09/28/19 0842  . PARoxetine (PAXIL) tablet 40 mg  40 mg Oral QAC supper Eulas Post, MD   40 mg at 09/27/19 1739  . polyethylene glycol (MIRALAX / GLYCOLAX) packet 17 g  17 g Oral Daily Patrecia Pour, NP   17 g at 09/28/19 0843  . simvastatin (ZOCOR) tablet 20 mg  20 mg Oral QHS Patrecia Pour, NP   20 mg at 09/27/19 2119  . sucralfate (CARAFATE) 1 GM/10ML  suspension 1 g  1 g Oral BID Eulas Post, MD        Lab Results:  Results for orders placed or performed during the hospital encounter of 09/25/19 (from the past 48 hour(s))  TSH     Status: None   Collection Time: 09/27/19  7:36 AM  Result Value Ref Range   TSH 3.761 0.350 - 4.500 uIU/mL    Comment: Performed by a 3rd Generation assay with a functional sensitivity of <=0.01 uIU/mL. Performed at [facility], [address] [place], [place] [address]     Blood Alcohol level:  Lab Results  Component Value Date   [facility] <10 09/24/2019   ETH <10 99/35/7017    Metabolic Disorder Labs: No results found for: HGBA1C, MPG No results found for: PROLACTIN Lab Results  Component Value Date   CHOL 162 07/27/2017   TRIG 93 07/27/2017   HDL 56 07/27/2017   VLDL 15 01/05/2016   LDLCALC 87 07/27/2017   LDLCALC 89 07/05/2016    Physical Findings: AIMS:  , ,  ,  ,   not needed  CIWA:    COWS:     Musculoskeletal: Strength & Muscle Tone: thin frail  Gait & Station: slow  Patient leans: n/a   Psychiatric Specialty Exam: Physical Exam  Review of Systems  Blood pressure 120/80, pulse 81, temperature 98.1 F (36.7 C), temperature source Oral, resp. rate 18, height 5\' 5"  (1.651 m), weight 54.4 kg, SpO2 100 %.Body mass index is 19.96 kg/m.  Mental Status about the same  Thin frail haggard forlorn Mood and affect slightly better Less anxious Eye contact and rapport somewhat better Concentration and attention okay Consciousness not clouded or fluctuant Thought process and content --nothing new Movements --no shakes tremors tics Fund of knowledge insight judgement reliability  Intelligence all fair to poor average to below aveage SI and HI --better overall  SI has reduced No HI  Speech normal  Oriented times four  Abstraction okay  Sleep:  Number of Hours: 6.5    Cognition  improving Recall better Psychomotor --faster now Leans  N/a Handedness not known  Akathisia none Assets  ---seeks improvement  Sleep improving      No new side effects or medical she says   Carafate liquid added bid for her   Treatment Plan Summary:  Same plan   Doxepin 10 gm po qhs added at night Zyprexa increased to 7.5 qhs Klonopin the same for now Buspar 10 bid Cymbalta in am as well.      Eulas Post, MD 09/28/2019, 3:12 PM

## 2019-09-28 NOTE — [facility] Group Notes (Signed)
[facility] Group Notes:  (Nursing/MHT/Case Management/Adjunct)  Date:  09/28/2019  Time:  9:29 PM  Type of Therapy:  Group Therapy  Participation Level:  Active  Participation Quality:  Appropriate  Affect:  Appropriate  Cognitive:  Alert  Insight:  Good  Engagement in Group:  Engaged and goal is to feel better.  Modes of Intervention:  Support  Summary of Progress/Problems:  Chelsey [name] 09/28/2019, 9:29 PM

## 2019-09-28 NOTE — Progress Notes (Signed)
Recreation Therapy Notes    Date: 09/28/2019  Time: 9:30 am   Location: Craft room     Behavioral response: N/A   Intervention Topic: Communication   Discussion/Intervention: Patient did not attend group.   Clinical Observations/Feedback:  Patient did not attend group.   Shontell Prosser LRT/CTRS         Ferne Ellingwood 09/28/2019 12:38 PM

## 2019-09-28 NOTE — Progress Notes (Signed)
Recreation Therapy Notes  INPATIENT RECREATION THERAPY ASSESSMENT  Patient Details Name: Chelsey [name] MRN: [id number] DOB: Feb 20, 1950 Today's Date: 09/28/2019       Information Obtained From: Patient  Able to Participate in Assessment/Interview: Yes  Patient Presentation: Responsive  Reason for Admission (Per Patient): Active Symptoms  Patient Stressors:    Coping Skills:   Talk  Leisure Interests (2+):   (None)  Frequency of Recreation/Participation:    Awareness of Community Resources:     [employer]:     Current Use:    If no, Barriers?:    Expressed Interest in [employer] Information:    [place] of Residence:  Insurance underwriter  Patient Main Form of Transportation: Musician  Patient Strengths:  N/A  Patient Identified Areas of Improvement:  N/A  Patient Goal for Hospitalization:  Get the right care  Current SI (including self-harm):  No  Current HI:  No  Current AVH: No  Staff Intervention Plan: Group Attendance, Collaborate with Interdisciplinary Treatment Team  Consent to Intern Participation: N/A  Carmisha Larusso 09/28/2019, 3:11 PM

## 2019-09-28 NOTE — Plan of Care (Signed)
D- Patient alert and oriented. Patient presented in a an anxious, but pleasant mood on assessment stating that "it took me a long time to get to sleep". Patient reported generalized pain, rating it a "9/10", however, she did not request PRN medication from this Probation officer. Patient also endorsed both depression/anxiety, stating that "lots of things" are making her feel this way. Patient denied SI, HI, AVH at this time. Patient's goal for today is to "be able to cope with life, stop being in pain. I can't seem to deal with daily problems. I'm afraid all the time".  A- Scheduled medications administered to patient, per MD orders. Support and encouragement provided.  Routine safety checks conducted every 15 minutes.  Patient informed to notify staff with problems or concerns.  R- No adverse drug reactions noted. Patient contracts for safety at this time. Patient compliant with medications. Patient receptive, calm, and cooperative. Patient interacts well with others on the unit.  Patient remains safe at this time.  Problem: Education: Goal: Knowledge of [place] [facility] Education information/materials will improve Outcome: Progressing Goal: Emotional status will improve Outcome: Progressing Goal: Mental status will improve Outcome: Progressing Goal: Verbalization of understanding the information provided will improve Outcome: Progressing   Problem: Safety: Goal: Periods of time without injury will increase Outcome: Progressing   Problem: Education: Goal: Ability to make informed decisions regarding treatment will improve Outcome: Progressing   Problem: Self-Concept: Goal: Ability to disclose and discuss suicidal ideas will improve Outcome: Progressing Goal: Will verbalize positive feelings about self Outcome: Progressing

## 2019-09-29 DIAGNOSIS — F332 Major depressive disorder, recurrent severe without psychotic features: Principal | ICD-10-CM

## 2019-09-29 MED ORDER — DOXEPIN HCL 10 MG PO CAPS
10.0000 mg | ORAL_CAPSULE | Freq: Once | ORAL | Status: AC
  Administered 2019-09-29: 10 mg via ORAL
  Filled 2019-09-29: qty 1

## 2019-09-29 MED ORDER — TRAZODONE HCL 50 MG PO TABS: 50.0000 mg | ORAL_TABLET | Freq: Every evening | ORAL | Status: DC | PRN

## 2019-09-29 MED ORDER — DOXEPIN HCL 10 MG PO CAPS
10.0000 mg | ORAL_CAPSULE | Freq: Every evening | ORAL | Status: DC | PRN
  Administered 2019-09-29 – 2019-09-30 (×2): 10 mg via ORAL
  Filled 2019-09-29 (×3): qty 1

## 2019-09-29 NOTE — Progress Notes (Signed)
Patient has been quiet. Out in the dayroom, appears sad and withdrawn. Denies SI, HI and AVH. Continues to report soreness in her shoulder from falls and medicated with Tylenol per prn order. Also given Sinequan for sleep per prn order and patient request. Says her abdominal pain is better but says she only had the one very small bowel movement today. Contracts for safety

## 2019-09-29 NOTE — Progress Notes (Addendum)
[facility] MD Progress Note  09/29/2019 11:25 AM Chelsey [name]  MRN:  [id number]  Principal Problem: <principal problem not specified> Diagnosis: Active Problems:   Major depressive disorder, recurrent severe without psychotic features [facility])  Chelsey [name] is a  69y.o. female that has a previous psychiatric history of depressive disorder, who presents to the [facility] unit for treatment of depressive disorder in the context of suicidal ideations.    Interval History  Patient was seen today for re-evaluation.  Nursing reports no events overnight. The patient reports no issues with performing ADLs.  Patient has been medication compliant.  The patient reports no side effects from medications.  Current symptoms being addressed include:  Depression, anxiety, insomnia.  Since last assessment, patient reports symptoms have improved marginally.    SUBJECTIVE: On assessment patient reports she did not sleep well last night and is feeling tired this AM as a result. She says she does not think Doxepin is working for sleep for her; she prefers Trazodone. Denies active suicidal thoughts, but reports remained depressed in context of "problems at home". Denies side effects from medications.  Current suicidal/homicidal ideations: Denies Current auditory/visual hallucinations: Denies  Review Of Systems: A complete review of systems of the following systems was conducted (Constitutional, Psychiatric, Neurological, Musculoskeletal, Eyes, Gastrointestinal, Cardiovascular, Respiratory, Skin, and Endocrine). All reviewed systems are negative except pertinent positives identified in the HPI.  Labs: no new to review.  Last ECG 8/30 - NSR, QTc 424ms.     Total Time spent with patient: 15 minutes  Past Psychiatric History: see H&P  Past Medical History:  Past Medical History:  Diagnosis Date  . Breast cancer ([place]) 1997   right breast, radiation  . Bronchitis    recent/ 06/09/15 had chest xray/ [facility]/resolved  . Cancer [facility]) 1997   right lumpectomy,L/Ad/R   . GERD (gastroesophageal reflux disease)   . History of hiatal hernia   . Hypercholesteremia   . Hyperlipidemia   . Low BP    TYPICALLY RUNS 80'S/60'S  . Panic attack   . Personal history of malignant neoplasm of breast     Past Surgical History:  Procedure Laterality Date  . BICEPT TENODESIS Left 11/23/2016   Procedure: BICEPS TENODESIS;  Surgeon: Leim Fabry, MD;  Location: [facility] ORS;  Service: Orthopedics;  Laterality: Left;  . BREAST EXCISIONAL BIOPSY Right 1997   pos  . BREAST SURGERY Right 1997   lumpectomy  . CHOLECYSTECTOMY N/A 08/06/2016   Procedure: LAPAROSCOPIC CHOLECYSTECTOMY WITH INTRAOPERATIVE CHOLANGIOGRAM;  Surgeon: Christene Lye, MD;  Location: [facility] ORS;  Service: General;  Laterality: N/A;  . COLONOSCOPY  2008  . COLONOSCOPY WITH PROPOFOL N/A 07/21/2015   Procedure: COLONOSCOPY WITH PROPOFOL;  Surgeon: Lucilla Lame, MD;  Location: [place];  Service: Endoscopy;  Laterality: N/A;  . COLONOSCOPY WITH PROPOFOL N/A 01/05/2018   Procedure: COLONOSCOPY WITH PROPOFOL;  Surgeon: Jonathon Bellows, MD;  Location: [facility] ENDOSCOPY;  Service: Gastroenterology;  Laterality: N/A;  . ESOPHAGOGASTRODUODENOSCOPY (EGD) WITH PROPOFOL N/A 06/16/2016   Procedure: ESOPHAGOGASTRODUODENOSCOPY (EGD) WITH PROPOFOL;  Surgeon: Christene Lye, MD;  Location: [facility] ENDOSCOPY;  Service: Endoscopy;  Laterality: N/A;  . ESOPHAGOGASTRODUODENOSCOPY (EGD) WITH PROPOFOL N/A 01/05/2018   Procedure: ESOPHAGOGASTRODUODENOSCOPY (EGD) WITH PROPOFOL;  Surgeon: Jonathon Bellows, MD;  Location: [facility] ENDOSCOPY;  Service: Gastroenterology;  Laterality: N/A;  . ESOPHAGOGASTRODUODENOSCOPY (EGD) WITH PROPOFOL N/A 06/13/2018   Procedure: ESOPHAGOGASTRODUODENOSCOPY (EGD) WITH PROPOFOL;  Surgeon: Jonathon Bellows, MD;  Location: [facility] ENDOSCOPY;  Service: Gastroenterology;  Laterality: N/A;  .  ESOPHAGOGASTRODUODENOSCOPY (EGD) WITH PROPOFOL N/A  07/16/2018   Procedure: ESOPHAGOGASTRODUODENOSCOPY (EGD) WITH PROPOFOL;  Surgeon: Lucilla Lame, MD;  Location: [facility] ENDOSCOPY;  Service: Endoscopy;  Laterality: N/A;  . HARDWARE REMOVAL Left 12/27/2016   Procedure: HARDWARE REMOVAL LEFT  PROXIMAL HUMEROUS;  Surgeon: Leim Fabry, MD;  Location: [facility] ORS;  Service: Orthopedics;  Laterality: Left;  . IR GJ TUBE CHANGE  04/21/2018  . NISSEN FUNDOPLICATION N/A 3/50/0938   Procedure: NISSEN FUNDOPLICATION;  Surgeon: Jules Husbands, MD;  Location: [facility] ORS;  Service: General;  Laterality: N/A;  . ORIF HUMERUS FRACTURE Left 11/23/2016   Procedure: OPEN REDUCTION INTERNAL FIXATION (ORIF) PROXIMAL HUMERUS FRACTURE;  Surgeon: Leim Fabry, MD;  Location: [facility] ORS;  Service: Orthopedics;  Laterality: Left;  . REPAIR OF ESOPHAGUS  03/09/2018   Procedure: REPAIR OF ESOPHAGUS;  Surgeon: Jules Husbands, MD;  Location: [facility] ORS;  Service: General;;  . REVERSE SHOULDER ARTHROPLASTY Left 12/27/2016   Procedure: REVERSE SHOULDER ARTHROPLASTY;  Surgeon: Leim Fabry, MD;  Location: [facility] ORS;  Service: Orthopedics;  Laterality: Left;  . ROBOTIC ASSISTED LAPAROSCOPIC REPAIR OF PARAESOPHAGEAL HERNIA N/A 03/09/2018   Procedure: ROBOTIC HIATAL HERNIA CONVERTED TO OPEN;  Surgeon: Jules Husbands, MD;  Location: [facility] ORS;  Service: General;  Laterality: N/A;   Family History:  Family History  Problem Relation Age of Onset  . Anxiety disorder Brother   . Obesity Brother   . COPD Brother   . Kidney disease Mother   . Heart attack Father   . Hypertension Father   . Alcohol abuse Father   . Depression Brother   . Anxiety disorder Brother   . Breast cancer Maternal Grandmother    Family Psychiatric  History: see H&P Social History:  Social History   Substance and Sexual Activity  Alcohol Use No  . Alcohol/week: 0.0 standard drinks     Social History   Substance and Sexual Activity  Drug Use No    Social History   Socioeconomic History  . Marital status:  Widowed    Spouse name: Not on file  . Number of children: 2  . Years of education: Not on file  . Highest education level: Bachelor's degree (e.g., BA, AB, BS)  Occupational History  . Not on file  Tobacco Use  . Smoking status: Current Some Day Smoker    Packs/day: 0.10    Years: 10.00    Pack years: 1.00    Types: Cigarettes    Start date: 11/25/2009  . Smokeless tobacco: Never Used  . Tobacco comment: 1 cigarette daily  Vaping Use  . Vaping Use: Never used  Substance and Sexual Activity  . Alcohol use: No    Alcohol/week: 0.0 standard drinks  . Drug use: No  . Sexual activity: Never  Other Topics Concern  . Not on file  Social History Narrative  . Not on file   Social Determinants of Health   Financial Resource Strain:   . Difficulty of Paying Living Expenses: Not on file  Food Insecurity:   . Worried About Charity fundraiser in the Last Year: Not on file  . Ran Out of Food in the Last Year: Not on file  Transportation Needs:   . Lack of Transportation (Medical): Not on file  . Lack of Transportation (Non-Medical): Not on file  Physical Activity:   . Days of Exercise per Week: Not on file  . Minutes of Exercise per Session: Not on file  Stress:   . Feeling of  Stress : Not on file  Social Connections:   . Frequency of Communication with Friends and Family: Not on file  . Frequency of Social Gatherings with Friends and Family: Not on file  . Attends Religious Services: Not on file  . Active Member of Clubs or Organizations: Not on file  . Attends Archivist Meetings: Not on file  . Marital Status: Not on file   Additional Social History:                         Sleep: Poor  Appetite:  Poor  Current Medications: Current Facility-Administered Medications  Medication Dose Route Frequency Provider Last Rate Last Admin  . acetaminophen (TYLENOL) tablet 650 mg  650 mg Oral Q6H PRN Patrecia Pour, NP   650 mg at 09/29/19 1117  . alum & mag  hydroxide-simeth (MAALOX/MYLANTA) 200-200-20 MG/5ML suspension 30 mL  30 mL Oral Q4H PRN Patrecia Pour, NP      . busPIRone (BUSPAR) tablet 10 mg  10 mg Oral BID Patrecia Pour, NP   10 mg at 09/29/19 1914  . clonazePAM (KLONOPIN) disintegrating tablet 0.25 mg  0.25 mg Oral BID Eulas Post, MD   0.25 mg at 09/29/19 7829  . doxepin (SINEQUAN) capsule 10 mg  10 mg Oral QHS Eulas Post, MD   10 mg at 09/28/19 2147  . iohexol (OMNIPAQUE) 9 MG/ML oral solution 500 mL  500 mL Oral BID PRN Patrecia Pour, NP      . linaclotide [facility]) capsule 72 mcg  72 mcg Oral Q0600 Hampton Abbot, MD   72 mcg at 09/29/19 0713  . magnesium hydroxide (MILK OF MAGNESIA) suspension 30 mL  30 mL Oral Daily PRN Patrecia Pour, NP      . OLANZapine (ZYPREXA) tablet 5 mg  5 mg Oral QHS Eulas Post, MD   5 mg at 09/28/19 2147  . pantoprazole (PROTONIX) EC tablet 40 mg  40 mg Oral Daily Patrecia Pour, NP   40 mg at 09/29/19 5621  . PARoxetine (PAXIL) tablet 40 mg  40 mg Oral QAC supper Eulas Post, MD   40 mg at 09/28/19 1656  . polyethylene glycol (MIRALAX / GLYCOLAX) packet 17 g  17 g Oral Daily Patrecia Pour, NP   17 g at 09/29/19 3086  . simvastatin (ZOCOR) tablet 20 mg  20 mg Oral QHS Patrecia Pour, NP   20 mg at 09/28/19 2148  . sucralfate (CARAFATE) 1 GM/10ML suspension 1 g  1 g Oral BID Eulas Post, MD   1 g at 09/29/19 5784    Lab Results: No results found for this or any previous visit (from the past 48 hour(s)).  Blood Alcohol level:  Lab Results  Component Value Date   ETH <10 09/24/2019   ETH <10 69/62/9528    Metabolic Disorder Labs: No results found for: HGBA1C, MPG No results found for: PROLACTIN Lab Results  Component Value Date   CHOL 162 07/27/2017   TRIG 93 07/27/2017   HDL 56 07/27/2017   VLDL 15 01/05/2016   LDLCALC 87 07/27/2017   LDLCALC 89 07/05/2016    Physical Findings: AIMS:  , ,  ,  ,    CIWA:    COWS:     Musculoskeletal: Strength &  Muscle Tone: within normal limits Gait & Station: normal Patient leans: Backward  Psychiatric Specialty Exam: Physical Exam  Review of Systems  Blood pressure 107/82,  pulse 79, temperature 98.9 F (37.2 C), temperature source Oral, resp. rate 18, height 5\' 5"  (1.651 m), weight 54.4 kg, SpO2 100 %.Body mass index is 19.96 kg/m.  General Appearance: Casual  Eye Contact:  Poor  Speech:  Slow  Volume:  Decreased  Mood:  Depressed  Affect:  Blunt  Thought Process:  Coherent and Goal Directed  Orientation:  Full (Time, Place, and Person)  Thought Content:  Logical  Suicidal Thoughts:  No  Homicidal Thoughts:  No  Memory:  NA  Judgement:  Other:  limited  Insight:  Fair  Psychomotor Activity:  Decreased  Concentration:  Concentration: Fair and Attention Span: Fair  Recall:  [employer] of Knowledge:  Fair  Language:  Fair  Akathisia:  No  Handed:  Right  AIMS (if indicated):     Assets:  Desire for Improvement  ADL's:  Intact  Cognition:  WNL  Sleep:  Number of Hours: 6.5     Treatment Plan Summary: Daily contact with patient to assess and evaluate symptoms and progress in treatment and Medication management   Patient is a 69 year old female with the above-stated past psychiatric history who is seen in follow-up.  Chart reviewed. Patient discussed with nursing. Patient remains depressed, although is not acutely suicidal. She complaints about insomnia and asks to restart Trazodone as it was effective in the past.  Will not restart Trazodone as the patient overdosed on that medication. Will continue PRN Doxepin at a current dose. Will reassess patient`s sleep tomorrow. No other med changes at this time (the dose of Zyprexa was just increased last night; last ECG 8/30 - NSR, QTc 456ms.)    Plan:  -continue inpatient psych admission; 15-minute checks; daily contact with patient to assess and evaluate symptoms and progress in treatment; psychoeducation.  -continue scheduled psych  medications:  . busPIRone  10 mg Oral BID  . clonazePAM  0.25 mg Oral BID  . linaclotide  72 mcg Oral Q0600  . OLANZapine  5 mg Oral QHS  . pantoprazole  40 mg Oral Daily  . PARoxetine  40 mg Oral QAC supper  . polyethylene glycol  17 g Oral Daily  . simvastatin  20 mg Oral QHS  . sucralfate  1 g Oral BID   -continue PRN medications.  acetaminophen, alum & mag hydroxide-simeth, doxepin, iohexol, magnesium hydroxide  -Disposition: to be determined. Likely d/c home with outpatient psych follow-up in 3-4 days when patient is stabilized.  Larita Fife, MD 09/29/2019, 11:25 AM

## 2019-09-29 NOTE — Progress Notes (Signed)
D: Patient is alert and oriented. Patient rates depression 8/10, hopelessness 7/10, and anxiety 9/10. Patient stated goal "get a sleep med prescribed". Patient reports energy level as low and concentration as good. Patient reports slept poor last night. Patient did receive medication for sleep and did not find it was effective. Verbalized physical pain as 7/10, legs, shoulder, stomach and head, was medicated without relief. Denies SI,HI, or AVH at this time.   A: Scheduled medications administered to patient per MD orders. Reassurance, support and encouragement provided. Verbally contracts for safety. Routine unit safety checks conducted Q 15 minutes.   R: Patient adhered to medication administration. No adverse drug reactions noted. Interacts well with others in milieu. Remains safe at this time, will continue to monitor.

## 2019-09-29 NOTE — Progress Notes (Signed)
Cooperative with treatment , she remains depressed but  She denies halluciantions, she denies avh, hi,si. She spent most of the evening in the dayroom with peers.She had no behavioral issues to report on shift.She appears to be in bed resting quietly at this time.

## 2019-09-29 NOTE — Plan of Care (Signed)
  Problem: Education: Goal: Knowledge of [place] [facility] Education information/materials will improve Outcome: Progressing Goal: Emotional status will improve Outcome: Progressing Goal: Mental status will improve Outcome: Progressing Goal: Verbalization of understanding the information provided will improve Outcome: Progressing   Problem: Safety: Goal: Periods of time without injury will increase Outcome: Progressing

## 2019-09-29 NOTE — [facility] Group Notes (Signed)
[place] Group Notes: (Clinical Social Work)   09/29/2019      Type of Therapy:  Group Therapy   Participation Level:  Did Not Attend - was invited individually by Nurse/MHT and chose not to attend.   Raina Mina, [place] 09/29/2019  2:51 PM

## 2019-09-30 NOTE — Plan of Care (Signed)
D: Patient is alert and oriented. Patient rates depression 8/10, hopelessness 7/10, and anxiety 6/10. Patient stated goal " to get more energy-not be afraid to function in daily life activities. Patient reports energy level as low and concentration as good. Patient reports slept fair last night. Patient did receive medication for sleep and did find it was effective. Verbalized generalized pain 7/10. Refused PRN Tylenol.  Denies SI,HI, or AVH at this time.   A: Scheduled medications administered to patient per MD orders. Reassurance, support and encouragement provided. Verbally contracts for safety. Routine unit safety checks conducted Q 15 minutes.   R: Patient adhered to medication administration. No adverse drug reactions noted. Interacts well with others in milieu. Remains safe at this time, will continue to monitor.   Problem: Education: Goal: Knowledge of [place] [facility] Education information/materials will improve Outcome: Progressing Goal: Mental status will improve Outcome: Progressing Goal: Verbalization of understanding the information provided will improve Outcome: Progressing   Problem: Safety: Goal: Periods of time without injury will increase Outcome: Progressing   Problem: Education: Goal: Ability to make informed decisions regarding treatment will improve Outcome: Progressing   Problem: Self-Concept: Goal: Ability to disclose and discuss suicidal ideas will improve Outcome: Progressing

## 2019-09-30 NOTE — Progress Notes (Signed)
[facility] MD Progress Note  09/30/2019 1:20 PM KYLIYAH STIRN  MRN:  [id number]  Principal Problem: <principal problem not specified> Diagnosis: Active Problems:   Major depressive disorder, recurrent severe without psychotic features [facility])  Mrs. Sinagra is a  69y.o. female that has a previous psychiatric history of depressive disorder, who presents to the [facility] unit for treatment of depressive disorder in the context of suicidal ideations.    Interval History Patient was seen today for re-evaluation.  Nursing reports no events overnight. The patient reports no issues with performing ADLs.  Patient has been medication compliant.  The patient reports no side effects from medications.  Current symptoms being addressed include:  Depression, anxiety, insomnia.  Since last assessment, patient reports symptoms have improved marginally.    SUBJECTIVE: On assessment patient reports she slept a little better last night and is adjusting to [place]. She continues report feeling depressed in settings of stressors at home: she lives with 55yo son who is an alcoholic and is not interested in treatment, he has no drivers license and patient has to take him around; she struggles with finances; she is having difficulties with bills and other paperwork; her another son goes through divorce and she does not want to bother him; she has a sister and brother, they are supportive somewhat; she suffers from chronic pain. She denies active suicidal thoughts, but reports periods of hopelessness. She would benefit from peer support or/and case management.  Current suicidal/homicidal ideations: Denies Current auditory/visual hallucinations: Denies  Review Of Systems: A complete review of systems of the following systems was conducted (Constitutional, Psychiatric, Neurological, Musculoskeletal, Eyes, Gastrointestinal, Cardiovascular, Respiratory, Skin, and Endocrine). All reviewed systems are negative except pertinent positives  identified in the HPI.  Labs: no new to review.  Last ECG 8/30 - NSR, QTc 43ms.     Total Time spent with patient: 15 minutes  Past Psychiatric History: see H&P  Past Medical History:  Past Medical History:  Diagnosis Date  . Breast cancer ([place]) 1997   right breast, radiation  . Bronchitis    recent/ 06/09/15 had chest xray/ [facility]/resolved  . Cancer [facility]) 1997   right lumpectomy,L/Ad/R   . GERD (gastroesophageal reflux disease)   . History of hiatal hernia   . Hypercholesteremia   . Hyperlipidemia   . Low BP    TYPICALLY RUNS 80'S/60'S  . Panic attack   . Personal history of malignant neoplasm of breast     Past Surgical History:  Procedure Laterality Date  . BICEPT TENODESIS Left 11/23/2016   Procedure: BICEPS TENODESIS;  Surgeon: Leim Fabry, MD;  Location: [facility] ORS;  Service: Orthopedics;  Laterality: Left;  . BREAST EXCISIONAL BIOPSY Right 1997   pos  . BREAST SURGERY Right 1997   lumpectomy  . CHOLECYSTECTOMY N/A 08/06/2016   Procedure: LAPAROSCOPIC CHOLECYSTECTOMY WITH INTRAOPERATIVE CHOLANGIOGRAM;  Surgeon: Christene Lye, MD;  Location: [facility] ORS;  Service: General;  Laterality: N/A;  . COLONOSCOPY  2008  . COLONOSCOPY WITH PROPOFOL N/A 07/21/2015   Procedure: COLONOSCOPY WITH PROPOFOL;  Surgeon: Lucilla Lame, MD;  Location: [place];  Service: Endoscopy;  Laterality: N/A;  . COLONOSCOPY WITH PROPOFOL N/A 01/05/2018   Procedure: COLONOSCOPY WITH PROPOFOL;  Surgeon: Jonathon Bellows, MD;  [facility] ENDOSCOPY;  Service: Gastroenterology;  Laterality: N/A;  . ESOPHAGOGASTRODUODENOSCOPY (EGD) WITH PROPOFOL N/A 06/16/2016   Procedure: ESOPHAGOGASTRODUODENOSCOPY (EGD) WITH PROPOFOL;  Surgeon: Christene Lye, MD;  Location: [facility] ENDOSCOPY;  Service: Endoscopy;  Laterality: N/A;  .  ESOPHAGOGASTRODUODENOSCOPY (EGD) WITH PROPOFOL N/A 01/05/2018   Procedure: ESOPHAGOGASTRODUODENOSCOPY (EGD) WITH PROPOFOL;  Surgeon: Jonathon Bellows, MD;   Location: [facility] ENDOSCOPY;  Service: Gastroenterology;  Laterality: N/A;  . ESOPHAGOGASTRODUODENOSCOPY (EGD) WITH PROPOFOL N/A 06/13/2018   Procedure: ESOPHAGOGASTRODUODENOSCOPY (EGD) WITH PROPOFOL;  Surgeon: Jonathon Bellows, MD;  Location: [facility] ENDOSCOPY;  Service: Gastroenterology;  Laterality: N/A;  . ESOPHAGOGASTRODUODENOSCOPY (EGD) WITH PROPOFOL N/A 07/16/2018   Procedure: ESOPHAGOGASTRODUODENOSCOPY (EGD) WITH PROPOFOL;  Surgeon: Lucilla Lame, MD;  Location: [facility] ENDOSCOPY;  Service: Endoscopy;  Laterality: N/A;  . HARDWARE REMOVAL Left 12/27/2016   Procedure: HARDWARE REMOVAL LEFT  PROXIMAL HUMEROUS;  Surgeon: Leim Fabry, MD;  Location: [facility] ORS;  Service: Orthopedics;  Laterality: Left;  . IR GJ TUBE CHANGE  04/21/2018  . NISSEN FUNDOPLICATION N/A 4/33/2951   Procedure: NISSEN FUNDOPLICATION;  Surgeon: Jules Husbands, MD;  Location: [facility] ORS;  Service: General;  Laterality: N/A;  . ORIF HUMERUS FRACTURE Left 11/23/2016   Procedure: OPEN REDUCTION INTERNAL FIXATION (ORIF) PROXIMAL HUMERUS FRACTURE;  Surgeon: Leim Fabry, MD;  Location: [facility] ORS;  Service: Orthopedics;  Laterality: Left;  . REPAIR OF ESOPHAGUS  03/09/2018   Procedure: REPAIR OF ESOPHAGUS;  Surgeon: Jules Husbands, MD;  Location: [facility] ORS;  Service: General;;  . REVERSE SHOULDER ARTHROPLASTY Left 12/27/2016   Procedure: REVERSE SHOULDER ARTHROPLASTY;  Surgeon: Leim Fabry, MD;  Location: [facility] ORS;  Service: Orthopedics;  Laterality: Left;  . ROBOTIC ASSISTED LAPAROSCOPIC REPAIR OF PARAESOPHAGEAL HERNIA N/A 03/09/2018   Procedure: ROBOTIC HIATAL HERNIA CONVERTED TO OPEN;  Surgeon: Jules Husbands, MD;  Location: [facility] ORS;  Service: General;  Laterality: N/A;   Family History:  Family History  Problem Relation Age of Onset  . Anxiety disorder Brother   . Obesity Brother   . COPD Brother   . Kidney disease Mother   . Heart attack Father   . Hypertension Father   . Alcohol abuse Father   . Depression Brother   . Anxiety disorder  Brother   . Breast cancer Maternal Grandmother    Family Psychiatric  History: see H&P Social History:  Social History   Substance and Sexual Activity  Alcohol Use No  . Alcohol/week: 0.0 standard drinks     Social History   Substance and Sexual Activity  Drug Use No    Social History   Socioeconomic History  . Marital status: Widowed    Spouse name: Not on file  . Number of children: 2  . Years of education: Not on file  . Highest education level: Bachelor's degree (e.g., BA, AB, BS)  Occupational History  . Not on file  Tobacco Use  . Smoking status: Current Some Day Smoker    Packs/day: 0.10    Years: 10.00    Pack years: 1.00    Types: Cigarettes    Start date: 11/25/2009  . Smokeless tobacco: Never Used  . Tobacco comment: 1 cigarette daily  Vaping Use  . Vaping Use: Never used  Substance and Sexual Activity  . Alcohol use: No    Alcohol/week: 0.0 standard drinks  . Drug use: No  . Sexual activity: Never  Other Topics Concern  . Not on file  Social History Narrative  . Not on file   Social Determinants of Health   Financial Resource Strain:   . Difficulty of Paying Living Expenses: Not on file  Food Insecurity:   . Worried About Charity fundraiser in the Last Year: Not on file  . Ran Out of Food  in the Last Year: Not on file  Transportation Needs:   . Lack of Transportation (Medical): Not on file  . Lack of Transportation (Non-Medical): Not on file  Physical Activity:   . Days of Exercise per Week: Not on file  . Minutes of Exercise per Session: Not on file  Stress:   . Feeling of Stress : Not on file  Social Connections:   . Frequency of Communication with Friends and Family: Not on file  . Frequency of Social Gatherings with Friends and Family: Not on file  . Attends Religious Services: Not on file  . Active Member of Clubs or Organizations: Not on file  . Attends Archivist Meetings: Not on file  . Marital Status: Not on file    Additional Social History:                         Sleep: Poor  Appetite:  Poor  Current Medications: Current Facility-Administered Medications  Medication Dose Route Frequency Provider Last Rate Last Admin  . acetaminophen (TYLENOL) tablet 650 mg  650 mg Oral Q6H PRN Patrecia Pour, NP   650 mg at 09/29/19 2115  . alum & mag hydroxide-simeth (MAALOX/MYLANTA) 200-200-20 MG/5ML suspension 30 mL  30 mL Oral Q4H PRN Patrecia Pour, NP      . busPIRone (BUSPAR) tablet 10 mg  10 mg Oral BID Patrecia Pour, NP   10 mg at 09/30/19 2952  . clonazePAM (KLONOPIN) disintegrating tablet 0.25 mg  0.25 mg Oral BID Eulas Post, MD   0.25 mg at 09/30/19 8413  . doxepin (SINEQUAN) capsule 10 mg  10 mg Oral QHS PRN Larita Fife, MD   10 mg at 09/29/19 2107  . iohexol (OMNIPAQUE) 9 MG/ML oral solution 500 mL  500 mL Oral BID PRN Patrecia Pour, NP      . linaclotide [facility]) capsule 72 mcg  72 mcg Oral Q0600 Hampton Abbot, MD   72 mcg at 09/30/19 [phone]  . magnesium hydroxide (MILK OF MAGNESIA) suspension 30 mL  30 mL Oral Daily PRN Patrecia Pour, NP      . OLANZapine (ZYPREXA) tablet 5 mg  5 mg Oral QHS Eulas Post, MD   5 mg at 09/29/19 2107  . pantoprazole (PROTONIX) EC tablet 40 mg  40 mg Oral Daily Patrecia Pour, NP   40 mg at 09/30/19 1027  . PARoxetine (PAXIL) tablet 40 mg  40 mg Oral QAC supper Eulas Post, MD   40 mg at 09/29/19 1659  . polyethylene glycol (MIRALAX / GLYCOLAX) packet 17 g  17 g Oral Daily Patrecia Pour, NP   17 g at 09/30/19 0831  . simvastatin (ZOCOR) tablet 20 mg  20 mg Oral QHS Patrecia Pour, NP   20 mg at 09/29/19 2107  . sucralfate (CARAFATE) 1 GM/10ML suspension 1 g  1 g Oral BID Eulas Post, MD   1 g at 09/30/19 2536    Lab Results: No results found for this or any previous visit (from the past 48 hour(s)).  Blood Alcohol level:  Lab Results  Component Value Date   ETH <10 09/24/2019   ETH <10 64/40/3474    Metabolic  Disorder Labs: No results found for: HGBA1C, MPG No results found for: PROLACTIN Lab Results  Component Value Date   CHOL 162 07/27/2017   TRIG 93 07/27/2017   HDL 56 07/27/2017   VLDL 15 01/05/2016   LDLCALC  87 07/27/2017   LDLCALC 89 07/05/2016    Physical Findings: AIMS:  , ,  ,  ,    CIWA:    COWS:     Musculoskeletal: Strength & Muscle Tone: within normal limits Gait & Station: normal Patient leans: N/A  Psychiatric Specialty Exam: Physical Exam   Review of Systems   Blood pressure 114/83, pulse 77, temperature 98.8 F (37.1 C), temperature source Oral, resp. rate 18, height 5\' 5"  (1.651 m), weight 54.4 kg, SpO2 100 %.Body mass index is 19.96 kg/m.  General Appearance: Casual  Eye Contact:  Poor  Speech:  Slow  Volume:  Decreased  Mood:  Depressed  Affect:  Blunt  Thought Process:  Coherent and Goal Directed  Orientation:  Full (Time, Place, and Person)  Thought Content:  Logical  Suicidal Thoughts:  No  Homicidal Thoughts:  No  Memory:  NA  Judgement:  Other:  limited  Insight:  Fair  Psychomotor Activity:  Decreased  Concentration:  Concentration: Fair and Attention Span: Fair  Recall:  [employer] of Knowledge:  Fair  Language:  Fair  Akathisia:  No  Handed:  Right  AIMS (if indicated):     Assets:  Desire for Improvement  ADL's:  Intact  Cognition:  WNL  Sleep:  Number of Hours: 7     Treatment Plan Summary: Daily contact with patient to assess and evaluate symptoms and progress in treatment and Medication management   Patient is a 69 year old female with the above-stated past psychiatric history who is seen in follow-up.  Chart reviewed. Patient discussed with nursing. Patient remains depressed in settings of life stressors, she is not acutely suicidal; she would benefit from outpatient peer support or/and case management. Her sleep has improved partially. No medication changes today (the dose of Zyprexa was increased two days ago; last ECG  8/30 - NSR, QTc 470ms.)    Plan:  -continue inpatient psych admission; 15-minute checks; daily contact with patient to assess and evaluate symptoms and progress in treatment; psychoeducation.  -continue scheduled psych medications:  . busPIRone  10 mg Oral BID  . clonazePAM  0.25 mg Oral BID  . linaclotide  72 mcg Oral Q0600  . OLANZapine  5 mg Oral QHS  . pantoprazole  40 mg Oral Daily  . PARoxetine  40 mg Oral QAC supper  . polyethylene glycol  17 g Oral Daily  . simvastatin  20 mg Oral QHS  . sucralfate  1 g Oral BID   -continue PRN medications.  acetaminophen, alum & mag hydroxide-simeth, doxepin, iohexol, magnesium hydroxide  -Disposition: to be determined. Likely d/c home with outpatient psych follow-up in 3-4 days when patient is stabilized.  Larita Fife, MD 09/30/2019, 1:20 PM

## 2019-09-30 NOTE — [facility] Group Notes (Signed)
[place] [facility] LCSW Group Therapy Note  Date/Time:  09/30/2019 1:18- 2:30 PM  Type of Therapy and Topic:  Group Therapy:  Healthy and Unhealthy Supports  Participation Level:  Active.   Description of Group:  Patients in this group were introduced to the idea of adding a variety of healthy supports to address the various needs in their lives.Patients discussed what additional healthy supports could be helpful in their recovery and wellness after discharge in order to prevent future hospitalizations.   An emphasis was placed on using counselor, doctor, therapy groups, 12-step groups, and problem-specific support groups to expand supports.  They also worked as a group on developing a specific plan for several patients to deal with unhealthy supports through [place], psychoeducation with loved ones, and even termination of relationships.   Therapeutic Goals:   1)  discuss importance of adding supports to stay well once out of the hospital  2)  compare healthy versus unhealthy supports and identify some examples of each  3)  generate ideas and descriptions of healthy supports that can be added  4)  offer mutual support about how to address unhealthy supports  5)  encourage active participation in and adherence to discharge plan    Summary of Patient Progress:  Patient stated that she does not have much support. Patient spoke about her family being there for her when it was convenient to them. Patient also talked about that they would hold her past over her head. Patient at times would put her head down on the table during group.    Therapeutic Modalities:   Motivational Interviewing Brief Solution-Focused Therapy  Raina Mina, Latanya Presser 09/30/2019  4:07 PM

## 2019-10-01 DIAGNOSIS — G894 Chronic pain syndrome: Secondary | ICD-10-CM

## 2019-10-01 MED ORDER — DOCUSATE SODIUM 100 MG PO CAPS
100.0000 mg | ORAL_CAPSULE | Freq: Two times a day (BID) | ORAL | Status: DC
  Administered 2019-10-01 – 2019-10-15 (×29): 100 mg via ORAL
  Filled 2019-10-01 (×29): qty 1

## 2019-10-01 MED ORDER — BUSPIRONE HCL 5 MG PO TABS
10.0000 mg | ORAL_TABLET | Freq: Three times a day (TID) | ORAL | Status: DC
  Administered 2019-10-01 – 2019-10-15 (×40): 10 mg via ORAL
  Filled 2019-10-01 (×40): qty 2

## 2019-10-01 MED ORDER — DOXEPIN HCL 10 MG PO CAPS
10.0000 mg | ORAL_CAPSULE | Freq: Once | ORAL | Status: AC
  Administered 2019-10-01: 10 mg via ORAL

## 2019-10-01 NOTE — Progress Notes (Signed)
[facility] MD Progress Note  10/01/2019 2:53 PM Chelsey [name]  MRN:  [id number] Subjective: Follow-up for this 69 year old woman with major depression.  Patient seen chart reviewed.  This is a 69 year old woman who has had recurrent episodes of severe major depression who is here at the hospital after an overdose of medication with suicidal intent.  On interview today the patient is open and admitting that she wanted to die when she took the overdose.  Says she continues to have frequent thoughts that she wishes she were dead.  Mood feels depressed down flat and worthless all the time.  Denies any hallucinations.  Continues to complain mostly of the physical symptoms related to her abdomen including chronic pain which she says radiates all the way through her abdomen and is an aching pain.  She does not think it feels particularly like acid reflux.  She does admit that it is somewhat better since coming into the hospital.  She continues to be chronically constipated. Principal Problem: Major depressive disorder, recurrent severe without psychotic features ([place]) Diagnosis: Principal Problem:   Major depressive disorder, recurrent severe without psychotic features ([place]) Active Problems:   Gastroesophageal reflux disease without esophagitis   Chronic constipation   Chronic pain syndrome  Total Time spent with patient: 30 minutes  Past Psychiatric History: Patient has a history of prior longstanding depression.  Previous medication has mostly it appears included Paxil unclear if there are other medicines besides that and citalopram.  Not clear how well medication has worked in the past.  No known history of psychosis or mania.  Past Medical History:  Past Medical History:  Diagnosis Date  . Breast cancer ([place]) 1997   right breast, radiation  . Bronchitis    recent/ 06/09/15 had chest xray/ [facility]/resolved  . Cancer [facility]) 1997   right lumpectomy,L/Ad/R   . GERD (gastroesophageal reflux  disease)   . History of hiatal hernia   . Hypercholesteremia   . Hyperlipidemia   . Low BP    TYPICALLY RUNS 80'S/60'S  . Panic attack   . Personal history of malignant neoplasm of breast     Past Surgical History:  Procedure Laterality Date  . BICEPT TENODESIS Left 11/23/2016   Procedure: BICEPS TENODESIS;  Surgeon: Leim Fabry, MD;  Location: [facility] ORS;  Service: Orthopedics;  Laterality: Left;  . BREAST EXCISIONAL BIOPSY Right 1997   pos  . BREAST SURGERY Right 1997   lumpectomy  . CHOLECYSTECTOMY N/A 08/06/2016   Procedure: LAPAROSCOPIC CHOLECYSTECTOMY WITH INTRAOPERATIVE CHOLANGIOGRAM;  Surgeon: Christene Lye, MD;  Location: [facility] ORS;  Service: General;  Laterality: N/A;  . COLONOSCOPY  2008  . COLONOSCOPY WITH PROPOFOL N/A 07/21/2015   Procedure: COLONOSCOPY WITH PROPOFOL;  Surgeon: Lucilla Lame, MD;  Location: [place];  Service: Endoscopy;  Laterality: N/A;  . COLONOSCOPY WITH PROPOFOL N/A 01/05/2018   Procedure: COLONOSCOPY WITH PROPOFOL;  Surgeon: Jonathon Bellows, MD;  Location: [facility] ENDOSCOPY;  Service: Gastroenterology;  Laterality: N/A;  . ESOPHAGOGASTRODUODENOSCOPY (EGD) WITH PROPOFOL N/A 06/16/2016   Procedure: ESOPHAGOGASTRODUODENOSCOPY (EGD) WITH PROPOFOL;  Surgeon: Christene Lye, MD;  Location: [facility] ENDOSCOPY;  Service: Endoscopy;  Laterality: N/A;  . ESOPHAGOGASTRODUODENOSCOPY (EGD) WITH PROPOFOL N/A 01/05/2018   Procedure: ESOPHAGOGASTRODUODENOSCOPY (EGD) WITH PROPOFOL;  Surgeon: Jonathon Bellows, MD;  Location: [facility] ENDOSCOPY;  Service: Gastroenterology;  Laterality: N/A;  . ESOPHAGOGASTRODUODENOSCOPY (EGD) WITH PROPOFOL N/A 06/13/2018   Procedure: ESOPHAGOGASTRODUODENOSCOPY (EGD) WITH PROPOFOL;  Surgeon: Jonathon Bellows, MD;  Location: [facility] ENDOSCOPY;  Service: Gastroenterology;  Laterality: N/A;  . ESOPHAGOGASTRODUODENOSCOPY (EGD) WITH PROPOFOL N/A 07/16/2018   Procedure: ESOPHAGOGASTRODUODENOSCOPY (EGD) WITH PROPOFOL;  Surgeon: Lucilla Lame, MD;   Location: [facility] ENDOSCOPY;  Service: Endoscopy;  Laterality: N/A;  . HARDWARE REMOVAL Left 12/27/2016   Procedure: HARDWARE REMOVAL LEFT  PROXIMAL HUMEROUS;  Surgeon: Leim Fabry, MD;  Location: [facility] ORS;  Service: Orthopedics;  Laterality: Left;  . IR GJ TUBE CHANGE  04/21/2018  . NISSEN FUNDOPLICATION N/A 3/41/9622   Procedure: NISSEN FUNDOPLICATION;  Surgeon: Jules Husbands, MD;  Location: [facility] ORS;  Service: General;  Laterality: N/A;  . ORIF HUMERUS FRACTURE Left 11/23/2016   Procedure: OPEN REDUCTION INTERNAL FIXATION (ORIF) PROXIMAL HUMERUS FRACTURE;  Surgeon: Leim Fabry, MD;  Location: [facility]S;  Service: Orthopedics;  Laterality: Left;  . REPAIR OF ESOPHAGUS  03/09/2018   Procedure: REPAIR OF ESOPHAGUS;  Surgeon: Jules Husbands, MD;  Location: [facility]S;  Service: General;;  . REVERSE SHOULDER ARTHROPLASTY Left 12/27/2016   Procedure: REVERSE SHOULDER ARTHROPLASTY;  Surgeon: Leim Fabry, MD;  Location: [facility];  Service: Orthopedics;  Laterality: Left;  . ROBOTIC ASSISTED LAPAROSCOPIC REPAIR OF PARAESOPHAGEAL HERNIA N/A 03/09/2018   Procedure: ROBOTIC HIATAL HERNIA CONVERTED TO OPEN;  Surgeon: Jules Husbands, MD;  Location: [facility] ORS;  Service: General;  Laterality: N/A;   Family History:  Family History  Problem Relation Age of Onset  . Anxiety disorder Brother   . Obesity Brother   . COPD Brother   . Kidney disease Mother   . Heart attack Father   . Hypertension Father   . Alcohol abuse Father   . Depression Brother   . Anxiety disorder Brother   . Breast cancer Maternal Grandmother    Family Psychiatric  History: Patient reports that her son has a drinking problem.  This is one of her major stresses.  Additionally her brother had depression and died of suicide. Social History:  Social History   Substance and Sexual Activity  Alcohol Use No  . Alcohol/week: 0.0 standard drinks     Social History   Substance and Sexual Activity  Drug Use No    Social History    Socioeconomic History  . Marital status: Widowed    Spouse name: Not on file  . Number of children: 2  . Years of education: Not on file  . Highest education level: Bachelor's degree (e.g., BA, AB, BS)  Occupational History  . Not on file  Tobacco Use  . Smoking status: Current Some Day Smoker    Packs/day: 0.10    Years: 10.00    Pack years: 1.00    Types: Cigarettes    Start date: 11/25/2009  . Smokeless tobacco: Never Used  . Tobacco comment: 1 cigarette daily  Vaping Use  . Vaping Use: Never used  Substance and Sexual Activity  . Alcohol use: No    Alcohol/week: 0.0 standard drinks  . Drug use: No  . Sexual activity: Never  Other Topics Concern  . Not on file  Social History Narrative  . Not on file   Social Determinants of Health   Financial Resource Strain:   . Difficulty of Paying Living Expenses: Not on file  Food Insecurity:   . Worried About Charity fundraiser in the Last Year: Not on file  . Ran Out of Food in the Last Year: Not on file  Transportation Needs:   . Lack of Transportation (Medical): Not on file  . Lack of Transportation (Non-Medical): Not on file  Physical  Activity:   . Days of Exercise per Week: Not on file  . Minutes of Exercise per Session: Not on file  Stress:   . Feeling of Stress : Not on file  Social Connections:   . Frequency of Communication with Friends and Family: Not on file  . Frequency of Social Gatherings with Friends and Family: Not on file  . Attends Religious Services: Not on file  . Active Member of Clubs or Organizations: Not on file  . Attends Archivist Meetings: Not on file  . Marital Status: Not on file   Additional Social History:                         Sleep: Fair  Appetite:  Poor  Current Medications: Current Facility-Administered Medications  Medication Dose Route Frequency Provider Last Rate Last Admin  . acetaminophen (TYLENOL) tablet 650 mg  650 mg Oral Q6H PRN Patrecia Pour, NP   650 mg at 09/30/19 1650  . alum & mag hydroxide-simeth (MAALOX/MYLANTA) 200-200-20 MG/5ML suspension 30 mL  30 mL Oral Q4H PRN Patrecia Pour, NP      . busPIRone (BUSPAR) tablet 10 mg  10 mg Oral TID Gaston Dase, Madie Reno, MD   10 mg at 10/01/19 1218  . docusate sodium (COLACE) capsule 100 mg  100 mg Oral BID Elye Harmsen T, MD      . linaclotide [facility]) capsule 72 mcg  72 mcg Oral Q0600 Hampton Abbot, MD   72 mcg at 10/01/19 [phone]  . magnesium hydroxide (MILK OF MAGNESIA) suspension 30 mL  30 mL Oral Daily PRN Patrecia Pour, NP      . OLANZapine (ZYPREXA) tablet 5 mg  5 mg Oral QHS Eulas Post, MD   5 mg at 09/30/19 2120  . pantoprazole (PROTONIX) EC tablet 40 mg  40 mg Oral Daily Patrecia Pour, NP   40 mg at 10/01/19 0753  . PARoxetine (PAXIL) tablet 40 mg  40 mg Oral QAC supper Eulas Post, MD   40 mg at 09/30/19 1648  . polyethylene glycol (MIRALAX / GLYCOLAX) packet 17 g  17 g Oral Daily Patrecia Pour, NP   17 g at 10/01/19 0754  . simvastatin (ZOCOR) tablet 20 mg  20 mg Oral QHS Patrecia Pour, NP   20 mg at 09/30/19 2120    Lab Results: No results found for this or any previous visit (from the past 48 hour(s)).  Blood Alcohol level:  Lab Results  Component Value Date   ETH <10 09/24/2019   ETH <10 24/09/7351    Metabolic Disorder Labs: No results found for: HGBA1C, MPG No results found for: PROLACTIN Lab Results  Component Value Date   CHOL 162 07/27/2017   TRIG 93 07/27/2017   HDL 56 07/27/2017   VLDL 15 01/05/2016   LDLCALC 87 07/27/2017   LDLCALC 89 07/05/2016    Physical Findings: AIMS:  , ,  ,  ,    CIWA:    COWS:     Musculoskeletal: Strength & Muscle Tone: decreased Gait & Station: shuffle Patient leans: N/A  Psychiatric Specialty Exam: Physical Exam Vitals and nursing note reviewed.  Constitutional:      Appearance: She is well-developed. She is ill-appearing.  HENT:     Head: Normocephalic and atraumatic.  Eyes:      Conjunctiva/sclera: Conjunctivae normal.     Pupils: Pupils are equal, round, and reactive to light.  Cardiovascular:  Heart sounds: Normal heart sounds.  Pulmonary:     Effort: Pulmonary effort is normal.  Abdominal:     Palpations: Abdomen is soft.  Musculoskeletal:        General: Normal range of motion.     Cervical back: Normal range of motion.  Skin:    General: Skin is warm and dry.  Neurological:     General: No focal deficit present.     Mental Status: She is alert.  Psychiatric:        Attention and Perception: Attention normal.        Mood and Affect: Mood is depressed.        Speech: Speech is delayed.        Behavior: Behavior is withdrawn.        Thought Content: Thought content includes suicidal ideation. Thought content does not include suicidal plan.        Cognition and Memory: Cognition is impaired.        Judgment: Judgment normal.     Review of Systems  Constitutional: Negative.   HENT: Negative.   Eyes: Negative.   Respiratory: Negative.   Cardiovascular: Negative.   Gastrointestinal: Positive for abdominal pain and constipation.  Musculoskeletal: Negative.   Skin: Negative.   Neurological: Negative.   Psychiatric/Behavioral: Positive for dysphoric mood. The patient is nervous/anxious.     Blood pressure 114/83, pulse 77, temperature 98.8 F (37.1 C), temperature source Oral, resp. rate 18, height 5\' 5"  (1.651 m), weight 54.4 kg, SpO2 100 %.Body mass index is 19.96 kg/m.  General Appearance: Disheveled  Eye Contact:  Minimal  Speech:  Slow  Volume:  Decreased  Mood:  Depressed  Affect:  Depressed  Thought Process:  Coherent  Orientation:  Full (Time, Place, and Person)  Thought Content:  Rumination  Suicidal Thoughts:  Yes.  without intent/plan  Homicidal Thoughts:  No  Memory:  Immediate;   Fair Recent;   Fair Remote;   Fair  Judgement:  Impaired  Insight:  Fair  Psychomotor Activity:  Decreased  Concentration:  Concentration: Poor   Recall:  [employer] of Knowledge:  Fair  Language:  Fair  Akathisia:  No  Handed:  Right  AIMS (if indicated):     Assets:  Desire for Improvement Housing  ADL's:  Intact  Cognition:  WNL  Sleep:  Number of Hours: 7.75     Treatment Plan Summary: Daily contact with patient to assess and evaluate symptoms and progress in treatment, Medication management and Plan 69 year old woman with severe depression who continues to pray for death.  She does not appear to be psychotic but does appear to be extremely depressed.  Treatment team and professionals on the ward tell me they feel she has not shown any improvement in the past week.  Today I have adjusted her medicine slightly.  Some of her GI medicines appeared unnecessary such as sucralfate which is being discontinued.  I have added Colace for her specific complaints of constipation.  I have discussed with the patient possibility of electroconvulsive therapy without making yet a specific recommendation.  I think it might be very helpful to her mood but I would be concerned about memory impairment.  Meanwhile continue individual and group therapy and assessment and 15-minute checks.  Alethia Berthold, MD 10/01/2019, 2:53 PM

## 2019-10-01 NOTE — Plan of Care (Signed)
  Problem: Education: Goal: Knowledge of [place] [facility] Education information/materials will improve Outcome: Progressing Goal: Emotional status will improve Outcome: Progressing Goal: Mental status will improve Outcome: Progressing Goal: Verbalization of understanding the information provided will improve Outcome: Progressing   Problem: Safety: Goal: Periods of time without injury will increase Outcome: Progressing

## 2019-10-01 NOTE — Plan of Care (Signed)
Pt rates anxiety and depression both 8/10. Pt denies SI, HI and AVH. Pt was educated on care plan and verbalizes understanding. Pt was encouraged to attend groups. Collier Bullock RN Problem: Education: Goal: Knowledge of [facility] Education information/materials will improve 10/01/2019 1229 by Kieth Brightly, RN Outcome: Progressing 10/01/2019 1229 by Kieth Brightly, RN Outcome: Progressing 10/01/2019 1228 by Kieth Brightly, RN Outcome: Progressing Goal: Emotional status will improve 10/01/2019 1229 by Kieth Brightly, RN Outcome: Not Progressing 10/01/2019 1229 by Kieth Brightly, RN Outcome: Not Progressing 10/01/2019 1228 by Kieth Brightly, RN Outcome: Progressing Goal: Mental status will improve 10/01/2019 1229 by Kieth Brightly, RN Outcome: Not Progressing 10/01/2019 1229 by Kieth Brightly, RN Outcome: Not Progressing 10/01/2019 1228 by Kieth Brightly, RN Outcome: Progressing Goal: Verbalization of understanding the information provided will improve 10/01/2019 1229 by Kieth Brightly, RN Outcome: Progressing 10/01/2019 1229 by Kieth Brightly, RN Outcome: Progressing 10/01/2019 1228 by Kieth Brightly, RN Outcome: Progressing   Problem: Safety: Goal: Periods of time without injury will increase 10/01/2019 1229 by Kieth Brightly, RN Outcome: Progressing 10/01/2019 1229 by Kieth Brightly, RN Outcome: Progressing 10/01/2019 1228 by Kieth Brightly, RN Outcome: Progressing   Problem: Education: Goal: Ability to make informed decisions regarding treatment will improve 10/01/2019 1229 by Kieth Brightly, RN Outcome: Progressing 10/01/2019 1229 by Kieth Brightly, RN Outcome: Progressing 10/01/2019 1228 by Kieth Brightly, RN Outcome: Progressing   Problem: Self-Concept: Goal: Ability to disclose and discuss suicidal ideas will improve 10/01/2019 1229 by Kieth Brightly, RN Outcome: Progressing 10/01/2019 1229 by Kieth Brightly, RN Outcome: Progressing 10/01/2019 1228 by Kieth Brightly,  RN Outcome: Progressing Goal: Will verbalize positive feelings about self 10/01/2019 1229 by Kieth Brightly, RN Outcome: Not Progressing 10/01/2019 1229 by Kieth Brightly, RN Outcome: Progressing 10/01/2019 1228 by Kieth Brightly, RN Outcome: Progressing   Problem: Self-Concept: Goal: Ability to disclose and discuss suicidal ideas will improve 10/01/2019 1229 by Kieth Brightly, RN Outcome: Progressing 10/01/2019 1229 by Kieth Brightly, RN Outcome: Progressing 10/01/2019 1228 by Kieth Brightly, RN Outcome: Progressing Goal: Will verbalize positive feelings about self 10/01/2019 1229 by Kieth Brightly, RN Outcome: Not Progressing 10/01/2019 1229 by Kieth Brightly, RN Outcome: Progressing 10/01/2019 1228 by Kieth Brightly, RN Outcome: Progressing

## 2019-10-01 NOTE — B[facility] Group Notes (Signed)
[facility] LCSW Group Therapy Note  Date/Time:  10/01/2019  1:00pm-2:15pm  Type of Therapy and Topic:  Group Therapy:  Discharge Obstacles  Participation Level:  Active   Description of Group: In this process group, patients were asked first to share what they want their recovery to look like when they leave the hospital.  Group members were then asked to share the obstacles they anticipate facing as they try to achieve their goals.  The group discussed these anticipated obstacles to wellness individually with CSW, did not really interact with each other.  Therapeutic Goals: 1. Patients will identify what their recovery goals are for post-hospital discharge 2. Patients will think about and acknowledge the obstacles they think they will face in trying to achieve their goals 3. Patients will be able to realize that they are not alone and others are actually facing similar obstacles 4. Patients will start to consider a plan for how to overcome the anticipated obstacles.  Summary of Patient Progress:  At the beginning of group, patient expressed that her discharge goals are unknown, but readily could identify her obstacles as wanting to go back to bed every morning when she wakes up, coping with her son who drinks and does drugs, the manner in which being out of a routine seems to ruin her whole day, and being tired/fatigued all the time.  She wonders if exercise would help her to not be so tired, and received encouragement to consider both this as well as implementing a schedule at home since the one at the hospital is helpful.  Patient's participation in group was minimal but appropriate, affect was depressed and speech was soft and organized.  Her engagement in the group was appropriate, although there were other group members who were monopolizing and thus preventing her from additional spontaneous engagement.  Therapeutic Modalities: Processing Brief Solution Therapy   Selmer Dominion, LCSW

## 2019-10-01 NOTE — Progress Notes (Signed)
Patient has been pleasant and cooperative but still appears sad and withdrawn. She spends time out in the dayroom but her interaction with her peers is very minimal. Denies SI, HI and AVH

## 2019-10-01 NOTE — Progress Notes (Signed)
D- Patient alert and oriented. Affect/mood is blunted and depressed. Pt denies SI, HI, AVH, and pain. Pt has been calm and cooperative.   A- Scheduled medications administered to patient, per MD orders. Support and encouragement provided.  Routine safety checks conducted every 15 minutes.  Patient informed to notify staff with problems or concerns.  R- No adverse drug reactions noted. Patient contracts for safety at this time. Patient compliant with medications and treatment plan. Patient receptive, calm, and cooperative. Patient interacts well with others on the unit.  Patient remains safe at this time.  Collier Bullock RN

## 2019-10-02 LAB — LIPID PANEL
Cholesterol: 164 mg/dL (ref 0–200)
HDL: 52 mg/dL (ref >40)
LDL Cholesterol: 89 mg/dL (ref 0–99)
Total CHOL/HDL Ratio: 3.2 RATIO
Triglycerides: 117 mg/dL (ref <150)
VLDL: 23 mg/dL (ref 0–40)

## 2019-10-02 LAB — VITAMIN B12: Vitamin B-12: 335 pg/mL (ref 180–914)

## 2019-10-02 MED ORDER — OLANZAPINE 10 MG PO TABS
10.0000 mg | ORAL_TABLET | Freq: Every day | ORAL | Status: DC
  Administered 2019-10-02 – 2019-10-06 (×5): 10 mg via ORAL
  Filled 2019-10-02 (×5): qty 1

## 2019-10-02 MED ORDER — TRAZODONE HCL 100 MG PO TABS
100.0000 mg | ORAL_TABLET | Freq: Every day | ORAL | Status: DC
  Administered 2019-10-02 – 2019-10-14 (×13): 100 mg via ORAL
  Filled 2019-10-02 (×13): qty 1

## 2019-10-02 NOTE — Tx Team (Signed)
Interdisciplinary Treatment and Diagnostic Plan Update  10/02/2019 Time of Session: 8:30AM Chelsey [name] MRN: [id number]  Principal Diagnosis: Major depressive disorder, recurrent severe without psychotic features ([place])  Secondary Diagnoses: Principal Problem:   Major depressive disorder, recurrent severe without psychotic features ([place]) Active Problems:   Gastroesophageal reflux disease without esophagitis   Chronic constipation   Chronic pain syndrome   Current Medications:  Current Facility-Administered Medications  Medication Dose Route Frequency Provider Last Rate Last Admin  . acetaminophen (TYLENOL) tablet 650 mg  650 mg Oral Q6H PRN Patrecia Pour, NP   650 mg at 09/30/19 1650  . alum & mag hydroxide-simeth (MAALOX/MYLANTA) 200-200-20 MG/5ML suspension 30 mL  30 mL Oral Q4H PRN Patrecia Pour, NP      . busPIRone (BUSPAR) tablet 10 mg  10 mg Oral TID Clapacs, Madie Reno, MD   10 mg at 10/02/19 0751  . docusate sodium (COLACE) capsule 100 mg  100 mg Oral BID Clapacs, Madie Reno, MD   100 mg at 10/02/19 0751  . linaclotide (LINZESS) capsule 72 mcg  72 mcg Oral Q0600 Hampton Abbot, MD   72 mcg at 10/02/19 8182  . magnesium hydroxide (MILK OF MAGNESIA) suspension 30 mL  30 mL Oral Daily PRN Patrecia Pour, NP      . OLANZapine (ZYPREXA) tablet 5 mg  5 mg Oral QHS Eulas Post, MD   5 mg at 10/01/19 2135  . pantoprazole (PROTONIX) EC tablet 40 mg  40 mg Oral Daily Patrecia Pour, NP   40 mg at 10/02/19 0751  . PARoxetine (PAXIL) tablet 40 mg  40 mg Oral QAC supper Eulas Post, MD   40 mg at 10/01/19 1710  . polyethylene glycol (MIRALAX / GLYCOLAX) packet 17 g  17 g Oral Daily Patrecia Pour, NP   17 g at 10/02/19 0752  . simvastatin (ZOCOR) tablet 20 mg  20 mg Oral QHS Patrecia Pour, NP   20 mg at 10/01/19 2135   PTA Medications: Medications Prior to Admission  Medication Sig Dispense Refill Last Dose  . bisacodyl (DULCOLAX) 5 MG EC tablet Take 1 tablet (5 mg total) by  mouth daily. (Patient not taking: Reported on 09/24/2019) 30 tablet 0   . busPIRone (BUSPAR) 10 MG tablet Take 1 tablet (10 mg total) by mouth 2 (two) times daily. (Patient not taking: Reported on 09/24/2019) 180 tablet 1   . linaclotide (LINZESS) 72 MCG capsule Take 1 capsule (72 mcg total) by mouth daily before breakfast. 30 capsule 2   . omeprazole (PRILOSEC) 20 MG capsule Take 1 capsule (20 mg total) by mouth 2 (two) times daily before a meal. 180 capsule 3   . PARoxetine (PAXIL) 20 MG tablet Take 1 tablet (20 mg total) by mouth daily before supper. 90 tablet 1   . polyethylene glycol (MIRALAX / GLYCOLAX) 17 g packet Take 17 g by mouth daily. 14 each 0   . Simethicone (GAS-X PO) Take 1-2 tablets by mouth daily as needed (gas).     . simvastatin (ZOCOR) 20 MG tablet TAKE ONE TABLET BY MOUTH AT BEDTIME (Patient not taking: Reported on 07/12/2019) 90 tablet 1   . traZODone (DESYREL) 100 MG tablet Take 1 tablet (100 mg total) by mouth at bedtime. 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   Physician Treatment Plan for Primary Diagnosis: Major depressive disorder, recurrent severe without psychotic features ([place]) Long Term Goal(s):     Short Term Goals:    Medication Management: Evaluate patient's response, side effects, and tolerance of medication regimen.  Therapeutic Interventions: 1 to 1 sessions, Unit Group sessions and Medication administration.  Evaluation of Outcomes: Not Progressing  Physician Treatment Plan for Secondary Diagnosis: Principal Problem:   Major depressive disorder, recurrent severe without psychotic features ([place]) Active Problems:   Gastroesophageal reflux disease without esophagitis    Chronic constipation   Chronic pain syndrome  Long Term Goal(s):     Short Term Goals:       Medication Management: Evaluate patient's response, side effects, and tolerance of medication regimen.  Therapeutic Interventions: 1 to 1 sessions, Unit Group sessions and Medication administration.  Evaluation of Outcomes: Not Progressing   RN Treatment Plan for Primary Diagnosis: Major depressive disorder, recurrent severe without psychotic features ([place]) Long Term Goal(s): Knowledge of disease and therapeutic regimen to maintain health will improve  Short Term Goals: Ability to demonstrate self-control, Ability to participate in decision making will improve, Ability to verbalize feelings will improve, Ability to disclose and discuss suicidal ideas, Ability to identify and develop effective coping behaviors will improve and Compliance with prescribed medications will improve  Medication Management: RN will administer medications as ordered by provider, will assess and evaluate patient's response and provide education to patient for prescribed medication. RN will report any adverse and/or side effects to prescribing provider.  Therapeutic Interventions: 1 on 1 counseling sessions, Psychoeducation, Medication administration, Evaluate responses to treatment, Monitor vital signs and CBGs as ordered, Perform/monitor CIWA, COWS, AIMS and Fall Risk screenings as ordered, Perform wound care treatments as ordered.  Evaluation of Outcomes: Not Progressing   LCSW Treatment Plan for Primary Diagnosis: Major depressive disorder, recurrent severe without psychotic features ([place]) Long Term Goal(s): Safe transition to appropriate next level of care at discharge, Engage patient in therapeutic group addressing interpersonal concerns.  Short Term Goals: Engage patient in aftercare planning with referrals and resources, Increase social support, Increase ability to appropriately verbalize feelings, Increase  emotional regulation, Facilitate acceptance of mental health diagnosis and concerns and Increase skills for wellness and recovery  Therapeutic Interventions: Assess for all discharge needs, 1 to 1 time with Social worker, Explore available resources and support systems, Assess for adequacy in community support network, Educate family and significant other(s) on suicide prevention, Complete Psychosocial Assessment, Interpersonal group therapy.  Evaluation of Outcomes: Not Progressing   Progress in Treatment: Attending groups: Yes. Participating in groups: Yes. Taking medication as prescribed: Yes. Toleration medication: Yes. Family/Significant other contact made: Yes, individual(s) contacted:  SPE completed with the patient's son.   Patient understands diagnosis: Yes. Discussing patient identified problems/goals with staff: Yes. Medical problems stabilized or resolved: Yes. Denies suicidal/homicidal ideation: Yes. Issues/concerns per patient self-inventory: No. Other: none  New problem(s) identified: No, Describe:  none  New Short Term/Long Term Goal(s): medication management for mood stabilization; elimination of SI thoughts; development of comprehensive mental wellness/sobriety plan.  Updates 10/02/2019:  No changes at this time.   Patient Goals:   "get my medication right, stop hurting all day and get some energy" Updates 10/02/2019:  No changes at this time.  Discharge Plan or Barriers: Patient reports plans to return to her home. She has been hesitant at this time to agree to aftercare plans. Updates 10/02/2019:  No changes at this time.  Reason for Continuation of Hospitalization: Anxiety Depression Medication stabilization Suicidal ideation  Estimated Length of Stay:  1-7 days  Recreational Therapy: Patient: N/A Patient Goal: Patient will engage in groups without prompting or encouragement from LRT x3 group sessions within 5 recreation therapy group  sessions  Attendees: Patient:  10/02/2019 10:15 AM  Physician: Dr. Weber Cooks, MD 10/02/2019 10:15 AM  Nursing:  10/02/2019 10:15 AM  RN Care Manager: 10/02/2019 10:15 AM  Social Worker: Assunta Curtis, LCSW 10/02/2019 10:15 AM  Recreational Therapist:  10/02/2019 10:15 AM  Other:  10/02/2019 10:15 AM  Other:  10/02/2019 10:15 AM  Other: 10/02/2019 10:15 AM    Scribe for Treatment Team: Rozann Lesches, LCSW 10/02/2019 10:15 AM

## 2019-10-02 NOTE — Plan of Care (Signed)
Pt rates depression 7/10, hopelessness 8/10 and anxiety 6/10. Pt was educated on care plan and verbalizes understanding. Pt was encouraged to attend groups. Collier Bullock RN Problem: Education: Goal: Freight forwarder Education information/materials will improve Outcome: Progressing Goal: Emotional status will improve Outcome: Progressing Goal: Mental status will improve Outcome: Progressing Goal: Verbalization of understanding the information provided will improve Outcome: Progressing   Problem: Safety: Goal: Periods of time without injury will increase Outcome: Progressing   Problem: Education: Goal: Ability to make informed decisions regarding treatment will improve Outcome: Progressing   Problem: Self-Concept: Goal: Ability to disclose and discuss suicidal ideas will improve Outcome: Progressing Goal: Will verbalize positive feelings about self Outcome: Progressing   Problem: Education: Goal: Utilization of techniques to improve thought processes will improve Outcome: Progressing Goal: Knowledge of the prescribed therapeutic regimen will improve Outcome: Progressing   Problem: Activity: Goal: Interest or engagement in leisure activities will improve Outcome: Progressing Goal: Imbalance in normal sleep/wake cycle will improve Outcome: Progressing   Problem: Coping: Goal: Coping ability will improve Outcome: Progressing Goal: Will verbalize feelings Outcome: Progressing   Problem: Health Behavior/Discharge Planning: Goal: Ability to make decisions will improve Outcome: Progressing Goal: Compliance with therapeutic regimen will improve Outcome: Progressing   Problem: Role Relationship: Goal: Will demonstrate positive changes in social behaviors and relationships Outcome: Progressing   Problem: Safety: Goal: Ability to disclose and discuss suicidal ideas will improve Outcome: Progressing Goal: Ability to identify and utilize support systems that  promote safety will improve Outcome: Progressing   Problem: Self-Concept: Goal: Will verbalize positive feelings about self Outcome: Progressing Goal: Level of anxiety will decrease Outcome: Progressing   Problem: Education: Goal: Ability to state activities that reduce stress will improve Outcome: Progressing   Problem: Coping: Goal: Ability to identify and develop effective coping behavior will improve Outcome: Progressing   Problem: Self-Concept: Goal: Ability to identify factors that promote anxiety will improve Outcome: Progressing Goal: Level of anxiety will decrease Outcome: Progressing Goal: Ability to modify response to factors that promote anxiety will improve Outcome: Progressing

## 2019-10-02 NOTE — Progress Notes (Signed)
[facility] MD Progress Note  10/02/2019 3:03 PM Chelsey [name]  MRN:  [id number] Subjective: Patient seen chart reviewed.  Follow-up for 69 year old woman with severe depression.  Patient continues to be very depressed.  Low motivation.  Very low energy.  Talks a bit about how at home she felt so tired she could barely clean her self.  Continues to have suicidal thoughts although without any intention of acting on it currently but still says she wishes she would die.  Patient comes out of her room and stays on the milieu a little bit but stays pretty isolated.  Still has abdominal pain.  Reports that she had a bowel movement yesterday with some improvement in her discomfort but still has continued pain. Principal Problem: Major depressive disorder, recurrent severe without psychotic features ([place]) Diagnosis: Principal Problem:   Major depressive disorder, recurrent severe without psychotic features ([place]) Active Problems:   Gastroesophageal reflux disease without esophagitis   Chronic constipation   Chronic pain syndrome  Total Time spent with patient: 30 minutes  Past Psychiatric History: Patient has a history of recurrent episodes of depression.  Chronic depression with chronic somatic symptoms as well.  Past Medical History:  Past Medical History:  Diagnosis Date  . Breast cancer ([place]) 1997   right breast, radiation  . Bronchitis    recent/ 06/09/15 had chest xray/ [facility]/resolved  . Cancer [facility]) 1997   right lumpectomy,L/Ad/R   . GERD (gastroesophageal reflux disease)   . History of hiatal hernia   . Hypercholesteremia   . Hyperlipidemia   . Low BP    TYPICALLY RUNS 80'S/60'S  . Panic attack   . Personal history of malignant neoplasm of breast     Past Surgical History:  Procedure Laterality Date  . BICEPT TENODESIS Left 11/23/2016   Procedure: BICEPS TENODESIS;  Surgeon: Leim Fabry, MD;  Location: [facility] ORS;  Service: Orthopedics;  Laterality: Left;  . BREAST  EXCISIONAL BIOPSY Right 1997   pos  . BREAST SURGERY Right 1997   lumpectomy  . CHOLECYSTECTOMY N/A 08/06/2016   Procedure: LAPAROSCOPIC CHOLECYSTECTOMY WITH INTRAOPERATIVE CHOLANGIOGRAM;  Surgeon: Christene Lye, MD;  Location: [facility] ORS;  Service: General;  Laterality: N/A;  . COLONOSCOPY  2008  . COLONOSCOPY WITH PROPOFOL N/A 07/21/2015   Procedure: COLONOSCOPY WITH PROPOFOL;  Surgeon: Lucilla Lame, MD;  Location: [place];  Service: Endoscopy;  Laterality: N/A;  . COLONOSCOPY WITH PROPOFOL N/A 01/05/2018   Procedure: COLONOSCOPY WITH PROPOFOL;  Surgeon: Jonathon Bellows, MD;  Location: [facility] ENDOSCOPY;  Service: Gastroenterology;  Laterality: N/A;  . ESOPHAGOGASTRODUODENOSCOPY (EGD) WITH PROPOFOL N/A 06/16/2016   Procedure: ESOPHAGOGASTRODUODENOSCOPY (EGD) WITH PROPOFOL;  Surgeon: Christene Lye, MD;  Location: [facility] ENDOSCOPY;  Service: Endoscopy;  Laterality: N/A;  . ESOPHAGOGASTRODUODENOSCOPY (EGD) WITH PROPOFOL N/A 01/05/2018   Procedure: ESOPHAGOGASTRODUODENOSCOPY (EGD) WITH PROPOFOL;  Surgeon: Jonathon Bellows, MD;  Location: [facility] ENDOSCOPY;  Service: Gastroenterology;  Laterality: N/A;  . ESOPHAGOGASTRODUODENOSCOPY (EGD) WITH PROPOFOL N/A 06/13/2018   Procedure: ESOPHAGOGASTRODUODENOSCOPY (EGD) WITH PROPOFOL;  Surgeon: Jonathon Bellows, MD;  Location: [facility] ENDOSCOPY;  Service: Gastroenterology;  Laterality: N/A;  . ESOPHAGOGASTRODUODENOSCOPY (EGD) WITH PROPOFOL N/A 07/16/2018   Procedure: ESOPHAGOGASTRODUODENOSCOPY (EGD) WITH PROPOFOL;  Surgeon: Lucilla Lame, MD;  Location[facility] ENDOSCOPY;  Service: Endoscopy;  Laterality: N/A;  . HARDWARE REMOVAL Left 12/27/2016   Procedure: HARDWARE REMOVAL LEFT  PROXIMAL HUMEROUS;  Surgeon: Leim Fabry, MD;  Location: [facility] ORS;  Service: Orthopedics;  Laterality: Left;  . IR GJ TUBE CHANGE  04/21/2018  .  NISSEN FUNDOPLICATION N/A 2/42/3536   Procedure: NISSEN FUNDOPLICATION;  Surgeon: Jules Husbands, MD;  Location: [facility] ORS;  Service: General;   Laterality: N/A;  . ORIF HUMERUS FRACTURE Left 11/23/2016   Procedure: OPEN REDUCTION INTERNAL FIXATION (ORIF) PROXIMAL HUMERUS FRACTURE;  Surgeon: Leim Fabry, MD;  Location: [facility] ORS;  Service: Orthopedics;  Laterality: Left;  . REPAIR OF ESOPHAGUS  03/09/2018   Procedure: REPAIR OF ESOPHAGUS;  Surgeon: Jules Husbands, MD;  Location: [facility] ORS;  Service: General;;  . REVERSE SHOULDER ARTHROPLASTY Left 12/27/2016   Procedure: REVERSE SHOULDER ARTHROPLASTY;  Surgeon: Leim Fabry, MD;  Location: [facility] ORS;  Service: Orthopedics;  Laterality: Left;  . ROBOTIC ASSISTED LAPAROSCOPIC REPAIR OF PARAESOPHAGEAL HERNIA N/A 03/09/2018   Procedure: ROBOTIC HIATAL HERNIA CONVERTED TO OPEN;  Surgeon: Jules Husbands, MD;  Location: [facility] ORS;  Service: General;  Laterality: N/A;   Family History:  Family History  Problem Relation Age of Onset  . Anxiety disorder Brother   . Obesity Brother   . COPD Brother   . Kidney disease Mother   . Heart attack Father   . Hypertension Father   . Alcohol abuse Father   . Depression Brother   . Anxiety disorder Brother   . Breast cancer Maternal Grandmother    Family Psychiatric  History: See previous.  Son with alcohol abuse by her report.  Brother with depression who died by suicide Social History:  Social History   Substance and Sexual Activity  Alcohol Use No  . Alcohol/week: 0.0 standard drinks     Social History   Substance and Sexual Activity  Drug Use No    Social History   Socioeconomic History  . Marital status: Widowed    Spouse name: Not on file  . Number of children: 2  . Years of education: Not on file  . Highest education level: Bachelor's degree (e.g., BA, AB, BS)  Occupational History  . Not on file  Tobacco Use  . Smoking status: Current Some Day Smoker    Packs/day: 0.10    Years: 10.00    Pack years: 1.00    Types: Cigarettes    Start date: 11/25/2009  . Smokeless tobacco: Never Used  . Tobacco comment: 1 cigarette daily   Vaping Use  . Vaping Use: Never used  Substance and Sexual Activity  . Alcohol use: No    Alcohol/week: 0.0 standard drinks  . Drug use: No  . Sexual activity: Never  Other Topics Concern  . Not on file  Social History Narrative  . Not on file   Social Determinants of Health   Financial Resource Strain:   . Difficulty of Paying Living Expenses: Not on file  Food Insecurity:   . Worried About Charity fundraiser in the Last Year: Not on file  . Ran Out of Food in the Last Year: Not on file  Transportation Needs:   . Lack of Transportation (Medical): Not on file  . Lack of Transportation (Non-Medical): Not on file  Physical Activity:   . Days of Exercise per Week: Not on file  . Minutes of Exercise per Session: Not on file  Stress:   . Feeling of Stress : Not on file  Social Connections:   . Frequency of Communication with Friends and Family: Not on file  . Frequency of Social Gatherings with Friends and Family: Not on file  . Attends Religious Services: Not on file  . Active Member of Clubs or Organizations: Not on  file  . Attends Archivist Meetings: Not on file  . Marital Status: Not on file   Additional Social History:                         Sleep: Fair  Appetite:  Fair  Current Medications: Current Facility-Administered Medications  Medication Dose Route Frequency Provider Last Rate Last Admin  . acetaminophen (TYLENOL) tablet 650 mg  650 mg Oral Q6H PRN Patrecia Pour, NP   650 mg at 09/30/19 1650  . alum & mag hydroxide-simeth (MAALOX/MYLANTA) 200-200-20 MG/5ML suspension 30 mL  30 mL Oral Q4H PRN Patrecia Pour, NP      . busPIRone (BUSPAR) tablet 10 mg  10 mg Oral TID Kavontae Pritchard, Madie Reno, MD   10 mg at 10/02/19 1247  . docusate sodium (COLACE) capsule 100 mg  100 mg Oral BID Onya Eutsler, Madie Reno, MD   100 mg at 10/02/19 0751  . linaclotide (LINZESS) capsule 72 mcg  72 mcg Oral Q0600 [place] Abbot, MD   72 mcg at 10/02/19 0973  . magnesium  hydroxide (MILK OF MAGNESIA) suspension 30 mL  30 mL Oral Daily PRN Patrecia Pour, NP      . OLANZapine (ZYPREXA) tablet 10 mg  10 mg Oral QHS Kennady Zimmerle T, MD      . pantoprazole (PROTONIX) EC tablet 40 mg  40 mg Oral Daily Patrecia Pour, NP   40 mg at 10/02/19 0751  . PARoxetine (PAXIL) tablet 40 mg  40 mg Oral QAC supper Eulas Post, MD   40 mg at 10/01/19 1710  . polyethylene glycol (MIRALAX / GLYCOLAX) packet 17 g  17 g Oral Daily Patrecia Pour, NP   17 g at 10/02/19 0752  . traZODone (DESYREL) tablet 100 mg  100 mg Oral QHS Indya Oliveria, Madie Reno, MD        Lab Results:  Results for orders placed or performed during the hospital encounter of 09/25/19 (from the past 48 hour(s))  Lipid panel     Status: None   Collection Time: 10/02/19  7:29 AM  Result Value Ref Range   Cholesterol 164 0 - 200 mg/dL   Triglycerides 117 <150 mg/dL   HDL 52 >40 mg/dL   Total CHOL/HDL Ratio 3.2 RATIO   VLDL 23 0 - 40 mg/dL   LDL Cholesterol 89 0 - 99 mg/dL    Comment:        Total Cholesterol/HDL:CHD Risk Coronary Heart Disease Risk Table                     Men   Women  1/2 Average Risk   3.4   3.3  Average Risk       5.0   4.4  2 X Average Risk   9.6   7.1  3 X Average Risk  23.4   11.0        Use the calculated Patient Ratio above and the CHD Risk Table to determine the patient's CHD Risk.        ATP III CLASSIFICATION (LDL):  <100     mg/dL   Optimal  100-129  mg/dL   Near or Above                    Optimal  130-159  mg/dL   Borderline  160-189  mg/dL   High  >190     mg/dL   Very  High Performed at [facility], [address] [place], [place] [address]     Blood Alcohol level:  Lab Results  Component Value Date   [facility] <10 09/24/2019   ETH <10 62/86/3817    Metabolic Disorder Labs: No results found for: HGBA1C, MPG No results found for: PROLACTIN Lab Results  Component Value Date   CHOL 164 10/02/2019   TRIG 117 10/02/2019   HDL 52 10/02/2019   CHOLHDL  3.2 10/02/2019   VLDL 23 10/02/2019   LDLCALC 89 10/02/2019   LDLCALC 87 07/27/2017    Physical Findings: AIMS:  , ,  ,  ,    CIWA:    COWS:     Musculoskeletal: Strength & Muscle Tone: within normal limits Gait & Station: shuffle Patient leans: N/A  Psychiatric Specialty Exam: Physical Exam Vitals and nursing note reviewed.  Constitutional:      Appearance: She is well-developed.  HENT:     Head: Normocephalic and atraumatic.  Eyes:     Conjunctiva/sclera: Conjunctivae normal.     Pupils: Pupils are equal, round, and reactive to light.  Cardiovascular:     Heart sounds: Normal heart sounds.  Pulmonary:     Effort: Pulmonary effort is normal.  Abdominal:     Palpations: Abdomen is soft.  Musculoskeletal:        General: Normal range of motion.     Cervical back: Normal range of motion.  Skin:    General: Skin is warm and dry.  Neurological:     General: No focal deficit present.     Mental Status: She is alert.  Psychiatric:        Attention and Perception: She is inattentive.        Mood and Affect: Mood is depressed. Affect is flat.        Speech: Speech is delayed.        Behavior: Behavior is slowed and withdrawn. Behavior is cooperative.        Thought Content: Thought content includes suicidal ideation. Thought content does not include suicidal plan.        Cognition and Memory: Cognition is impaired.        Judgment: Judgment normal.     Review of Systems  Constitutional: Negative.   HENT: Negative.   Eyes: Negative.   Respiratory: Negative.   Cardiovascular: Negative.   Gastrointestinal: Positive for abdominal pain and constipation.  Musculoskeletal: Negative.   Skin: Negative.   Neurological: Negative.   Psychiatric/Behavioral: Positive for dysphoric mood and suicidal ideas.    Blood pressure 108/78, pulse 73, temperature 98.5 F (36.9 C), temperature source Oral, resp. rate 18, height 5\' 5"  (1.651 m), weight 54.4 kg, SpO2 100 %.Body mass index  is 19.96 kg/m.  General Appearance: Casual  Eye Contact:  Minimal  Speech:  Slow  Volume:  Decreased  Mood:  Depressed  Affect:  Congruent  Thought Process:  Coherent  Orientation:  Full (Time, Place, and Person)  Thought Content:  Rumination and Tangential  Suicidal Thoughts:  Yes.  without intent/plan  Homicidal Thoughts:  No  Memory:  Immediate;   Fair Recent;   Fair Remote;   Fair  Judgement:  Fair  Insight:  Fair  Psychomotor Activity:  Decreased  Concentration:  Concentration: Poor  Recall:  [employer] of Knowledge:  Fair  Language:  Fair  Akathisia:  No  Handed:  Right  AIMS (if indicated):     Assets:  Desire for Improvement Housing  ADL's:  Impaired  Cognition:  Impaired,  Mild  Sleep:  Number of Hours: 7.5     Treatment Plan Summary: Daily contact with patient to assess and evaluate symptoms and progress in treatment, Medication management and Plan Patient with severe depression.  No clear improvement since yesterday.  Still with abdominal pain.  1 improvement may be that she did have a bowel movement since yesterday.  Talk with patient about overall therapy plan.  ECT not available this week but we will continue to hold on to that as a possible treatment.  No change in current antidepressant profile for today.  Praised patient for her attempts to get out of her room and stay active.  Alethia Berthold, MD 10/02/2019, 3:03 PM

## 2019-10-02 NOTE — Progress Notes (Signed)
°   10/02/19 1400  Clinical Encounter Type  Visited With Patient  Visit Type Initial;Spiritual support;Social support;Behavioral Health  Referral From Chaplain  Consult/Referral To Chaplain  Pt attended Ch group today. The subject today was Life. How is life treating you. We had some great discussions and Pt participated a lot. Ch will follow-up with Pt.

## 2019-10-02 NOTE — Progress Notes (Signed)
D- Patient alert and oriented. Affect/mood is blunt, sullen worried, calm and cooperative. Pt denies SI, HI, AVH, and pain.Pt attended groups today and was social.   A- Scheduled medications administered to patient, per MD orders. Support and encouragement provided.  Routine safety checks conducted every 15 minutes.  Patient informed to notify staff with problems or concerns.  R- No adverse drug reactions noted. Patient contracts for safety at this time. Patient compliant with medications and treatment plan. Patient receptive, calm, and cooperative. Patient interacts well with others on the unit.  Patient remains safe at this time.  Collier Bullock RN

## 2019-10-02 NOTE — Plan of Care (Signed)
°  Problem: Education: Goal: Knowledge of [facility] Education information/materials will improve Outcome: Progressing Goal: Emotional status will improve Outcome: Progressing Goal: Mental status will improve Outcome: Progressing Goal: Verbalization of understanding the information provided will improve Outcome: Progressing   Problem: Safety: Goal: Periods of time without injury will increase Outcome: Progressing   Problem: Education: Goal: Ability to make informed decisions regarding treatment will improve Outcome: Progressing   Problem: Self-Concept: Goal: Ability to disclose and discuss suicidal ideas will improve Outcome: Progressing Goal: Will verbalize positive feelings about self Outcome: Progressing   Problem: Education: Goal: Utilization of techniques to improve thought processes will improve Outcome: Progressing Goal: Knowledge of the prescribed therapeutic regimen will improve Outcome: Progressing   Problem: Activity: Goal: Interest or engagement in leisure activities will improve Outcome: Progressing Goal: Imbalance in normal sleep/wake cycle will improve Outcome: Progressing   Problem: Coping: Goal: Coping ability will improve Outcome: Progressing Goal: Will verbalize feelings Outcome: Progressing   Problem: Health Behavior/Discharge Planning: Goal: Ability to make decisions will improve Outcome: Progressing Goal: Compliance with therapeutic regimen will improve Outcome: Progressing   Problem: Role Relationship: Goal: Will demonstrate positive changes in social behaviors and relationships Outcome: Progressing   Problem: Safety: Goal: Ability to disclose and discuss suicidal ideas will improve Outcome: Progressing Goal: Ability to identify and utilize support systems that promote safety will improve Outcome: Progressing   Problem: Self-Concept: Goal: Will verbalize positive feelings about self Outcome: Progressing Goal: Level of anxiety  will decrease Outcome: Progressing   Problem: Education: Goal: Ability to state activities that reduce stress will improve Outcome: Progressing   Problem: Coping: Goal: Ability to identify and develop effective coping behavior will improve Outcome: Progressing   Problem: Self-Concept: Goal: Ability to identify factors that promote anxiety will improve Outcome: Progressing Goal: Level of anxiety will decrease Outcome: Progressing Goal: Ability to modify response to factors that promote anxiety will improve Outcome: Progressing

## 2019-10-02 NOTE — Progress Notes (Signed)
Patient still appears flat, depressed and withdrawn. Denies SI. Complained of not being able to sleep. Her prn sleep medication had been discontinued. Corene Cornea, NP restarted her sinquan 10 mg per one time order and patient has had no further complaints.

## 2019-10-02 NOTE — Plan of Care (Signed)
  Problem: Education: Goal: Knowledge of [place] [facility] Education information/materials will improve Outcome: Progressing Goal: Emotional status will improve Outcome: Progressing Goal: Mental status will improve Outcome: Progressing Goal: Verbalization of understanding the information provided will improve Outcome: Progressing   Problem: Safety: Goal: Periods of time without injury will increase Outcome: Progressing

## 2019-10-03 ENCOUNTER — Inpatient Hospital Stay: Payer: Medicare Other

## 2019-10-03 ENCOUNTER — Other Ambulatory Visit: Payer: Self-pay | Admitting: Orthopedic Surgery

## 2019-10-03 DIAGNOSIS — S42202S Unspecified fracture of upper end of left humerus, sequela: Secondary | ICD-10-CM

## 2019-10-03 MED ORDER — IBUPROFEN 600 MG PO TABS
600.0000 mg | ORAL_TABLET | Freq: Four times a day (QID) | ORAL | Status: DC | PRN
  Administered 2019-10-03 – 2019-10-10 (×8): 600 mg via ORAL
  Filled 2019-10-03 (×8): qty 1

## 2019-10-03 MED ORDER — SENNOSIDES-DOCUSATE SODIUM 8.6-50 MG PO TABS
2.0000 | ORAL_TABLET | Freq: Two times a day (BID) | ORAL | Status: DC | PRN
  Administered 2019-10-13: 2 via ORAL
  Filled 2019-10-03 (×2): qty 2

## 2019-10-03 NOTE — Progress Notes (Signed)
Recreation Therapy Notes  Date: 10/03/2019  Time: 9:30 am  Location: Craft room   Behavioral response: Appropriate  Intervention Topic: Stress Management    Discussion/Intervention:  Group content on today was focused on stress. The group defined stress and way to cope with stress. Participants expressed how they know when they are stresses out. Individuals described the different ways they have to cope with stress. The group stated reasons why it is important to cope with stress. Patient explained what good stress is and some examples. The group participated in the intervention "Stress Management". Individuals were separated into two group and answered questions related to stress.   Clinical Observations/Feedback:  Patient came to group late due to unknown reasons.  Individual was social with peers and staff while participating in the intervention.  Caydan Mctavish LRT/CTRS         Reighn Kaplan 10/03/2019 2:47 PM

## 2019-10-03 NOTE — [facility] Group Notes (Signed)
LCSW Group Therapy Note  10/03/2019 3:33 PM  Type of Therapy/Topic:  Group Therapy:  Emotion Regulation  Participation Level:  None   Description of Group:   The purpose of this group is to assist patients in learning to regulate negative emotions and experience positive emotions. Patients will be guided to discuss ways in which they have been vulnerable to their negative emotions. These vulnerabilities will be juxtaposed with experiences of positive emotions or situations, and patients will be challenged to use positive emotions to combat negative ones. Special emphasis will be placed on coping with negative emotions in conflict situations, and patients will process healthy conflict resolution skills.  Therapeutic Goals: 1. Patient will identify two positive emotions or experiences to reflect on in order to balance out negative emotions 2. Patient will label two or more emotions that they find the most difficult to experience 3. Patient will demonstrate positive conflict resolution skills through discussion and/or role plays  Summary of Patient Progress: Patient was present in group, however, did not engage in group discussions.    Therapeutic Modalities:   Cognitive Behavioral Therapy Feelings Identification Dialectical Behavioral Therapy  Assunta Curtis, MSW, LCSW 10/03/2019 3:33 PM

## 2019-10-03 NOTE — Progress Notes (Signed)
D- Patient alert and oriented. Affect/mood is sullen and depressed. Pt denies SI, HI, AVH, and pain. Pt has been calm and cooperative. Pt has attend groups and been social.   A- Scheduled medications administered to patient, per MD orders. Support and encouragement provided.  Routine safety checks conducted every 15 minutes.  Patient informed to notify staff with problems or concerns.  R- No adverse drug reactions noted. Patient contracts for safety at this time. Patient compliant with medications and treatment plan. Patient receptive, calm, and cooperative. Patient interacts well with others on the unit.  Patient remains safe at this time.  Collier Bullock RN

## 2019-10-03 NOTE — Plan of Care (Signed)
°  Problem: Group Participation °Goal: STG - Patient will engage in groups without prompting or encouragement from LRT x3 group sessions within 5 recreation therapy group sessions °Description: STG - Patient will engage in groups without prompting or encouragement from LRT x3 group sessions within 5 recreation therapy group sessions °Outcome: Progressing °  °

## 2019-10-03 NOTE — Plan of Care (Signed)
Pt rates depression 7/10, hopelessness 6/10 and anxiety 6/10. Pt was educated on care plan and verbalizes understanding. Pt was encouraged to attend groups. Collier Bullock RN Problem: Education: Goal: [employer] Education information/materials will improve Outcome: Progressing Goal: Emotional status will improve Outcome: Progressing Goal: Mental status will improve Outcome: Progressing Goal: Verbalization of understanding the information provided will improve Outcome: Progressing   Problem: Safety: Goal: Periods of time without injury will increase Outcome: Progressing   Problem: Education: Goal: Ability to make informed decisions regarding treatment will improve Outcome: Progressing   Problem: Self-Concept: Goal: Ability to disclose and discuss suicidal ideas will improve Outcome: Progressing Goal: Will verbalize positive feelings about self Outcome: Progressing   Problem: Education: Goal: Utilization of techniques to improve thought processes will improve Outcome: Progressing Goal: Knowledge of the prescribed therapeutic regimen will improve Outcome: Progressing   Problem: Activity: Goal: Interest or engagement in leisure activities will improve Outcome: Progressing Goal: Imbalance in normal sleep/wake cycle will improve Outcome: Progressing   Problem: Coping: Goal: Coping ability will improve Outcome: Progressing Goal: Will verbalize feelings Outcome: Progressing   Problem: Health Behavior/Discharge Planning: Goal: Ability to make decisions will improve Outcome: Progressing Goal: Compliance with therapeutic regimen will improve Outcome: Progressing   Problem: Role Relationship: Goal: Will demonstrate positive changes in social behaviors and relationships Outcome: Progressing   Problem: Safety: Goal: Ability to disclose and discuss suicidal ideas will improve Outcome: Progressing Goal: Ability to identify and utilize support systems that  promote safety will improve Outcome: Progressing   Problem: Self-Concept: Goal: Will verbalize positive feelings about self Outcome: Progressing Goal: Level of anxiety will decrease Outcome: Progressing   Problem: Education: Goal: Ability to state activities that reduce stress will improve Outcome: Progressing   Problem: Coping: Goal: Ability to identify and develop effective coping behavior will improve Outcome: Progressing   Problem: Self-Concept: Goal: Ability to identify factors that promote anxiety will improve Outcome: Progressing Goal: Level of anxiety will decrease Outcome: Progressing Goal: Ability to modify response to factors that promote anxiety will improve Outcome: Progressing

## 2019-10-03 NOTE — Consult Note (Signed)
Reason for Consult: Left shoulder pain Referring Physician: Dr. Karle [name] is an 69 y.o. female.  HPI: Patient is a 69 year old who suffered a left humeral neck fracture initially treated with open reduction internal fixation by Dr. [name].  Has had failure fixation and subsequently underwent reverse shoulder arthroplasty.  This had problems with bone resorption and subsequently had revision surgery at [name].  That that surgery there was a plate applied to the lateral aspect of the proximal humerus as well as significant bone grafting.  She has had some chronic pain since then and is complaining of a great deal of shoulder pain now.  COVID interfered with her ability to do therapy postoperatively and she never regained much motion.  She complains predominantly of shoulder pain that is posterior and over the scapula as well as some pain over the lateral aspect of the shoulder.  Past Medical History:  Diagnosis Date  . Breast cancer ([name]) 1997   right breast, radiation  . Bronchitis    recent/ 06/09/15 had chest xray/ [name]/resolved  . Cancer [name]) 1997   right lumpectomy,L/Ad/R   . GERD (gastroesophageal reflux disease)   . History of hiatal hernia   . Hypercholesteremia   . Hyperlipidemia   . Low BP    TYPICALLY RUNS 80'S/60'S  . Panic attack   . Personal history of malignant neoplasm of breast     Past Surgical History:  Procedure Laterality Date  . BICEPT TENODESIS Left 11/23/2016   Procedure: BICEPS TENODESIS;  Surgeon: [facility] Fabry, MD;  Location: [name];  Service: Orthopedics;  Laterality: Left;  . BREAST EXCISIONAL BIOPSY Right 1997   pos  . BREAST SURGERY Right 1997   lumpectomy  . CHOLECYSTECTOMY N/A 08/06/2016   Procedure: LAPAROSCOPIC CHOLECYSTECTOMY WITH INTRAOPERATIVE CHOLANGIOGRAM;  Surgeon: [facility] Lye, MD;  Location: [name];  Service: General;  Laterality: N/A;  . COLONOSCOPY  2008  . COLONOSCOPY WITH PROPOFOL N/A  07/21/2015   Procedure: COLONOSCOPY WITH PROPOFOL;  Surgeon: [facility] Lame, MD;  [name];  Service: [name];  Laterality: N/A;  . COLONOSCOPY WITH PROPOFOL N/A 01/05/2018   Procedure: COLONOSCOPY WITH PROPOFOL;  Surgeon: [facility] Bellows, MD;  [name];  Service: Gastroenterology;  Laterality: N/A;  . ESOPHAGOGASTRODUODENOSCOPY (EGD) WITH PROPOFOL N/A 06/16/2016   Procedure: ESOPHAGOGASTRODUODENOSCOPY (EGD) WITH PROPOFOL;  Surgeon: [facility] Lye, MD;  [name];  Service: [name];  Laterality: N/A;  . ESOPHAGOGASTRODUODENOSCOPY (EGD) WITH PROPOFOL N/A 01/05/2018   Procedure: ESOPHAGOGASTRODUODENOSCOPY (EGD) WITH PROPOFOL;  Surgeon: [facility] Bellows, MD;  [name];  Service: Gastroenterology;  Laterality: N/A;  . ESOPHAGOGASTRODUODENOSCOPY (EGD) WITH PROPOFOL N/A 06/13/2018   Procedure: ESOPHAGOGASTRODUODENOSCOPY (EGD) WITH PROPOFOL;  Surgeon: [facility] Bellows, MD;  [name];  Service: Gastroenterology;  Laterality: N/A;  . ESOPHAGOGASTRODUODENOSCOPY (EGD) WITH PROPOFOL N/A 07/16/2018   Procedure: ESOPHAGOGASTRODUODENOSCOPY (EGD) WITH PROPOFOL;  Surgeon: [facility] Lame, MD;  [name];  Service: [name];  Laterality: N/A;  . HARDWARE REMOVAL Left 12/27/2016   Procedure: HARDWARE REMOVAL LEFT  PROXIMAL HUMEROUS;  Surgeon: [facility] Fabry, MD;  Location: [name];  Service: Orthopedics;  Laterality: Left;  . IR GJ TUBE CHANGE  04/21/2018  . NISSEN FUNDOPLICATION N/A 0/08/6759   Procedure: NISSEN FUNDOPLICATION;  Surgeon: [facility] Husbands, MD;  Location: [name];  Service: General;  Laterality: N/A;  . ORIF HUMERUS FRACTURE Left 11/23/2016   Procedure: OPEN REDUCTION INTERNAL FIXATION (ORIF) PROXIMAL HUMERUS FRACTURE;  Surgeon: [facility] Fabry, MD;  [name];  Service: Orthopedics;  Laterality: Left;  . REPAIR OF ESOPHAGUS  03/09/2018   Procedure: REPAIR OF ESOPHAGUS;  Surgeon: [facility] Husbands, MD;  Location:  [name];  Service: General;;  . REVERSE SHOULDER ARTHROPLASTY Left 12/27/2016   Procedure: REVERSE SHOULDER ARTHROPLASTY;  Surgeon: [facility] Fabry, MD;  Location: [name];  Service: Orthopedics;  Laterality: Left;  . ROBOTIC ASSISTED LAPAROSCOPIC REPAIR OF PARAESOPHAGEAL HERNIA N/A 03/09/2018   Procedure: ROBOTIC HIATAL HERNIA CONVERTED TO OPEN;  Surgeon: [facility] Husbands, MD;  Location: [name];  Service: General;  Laterality: N/A;    Family History  Problem Relation Age of Onset  . Anxiety disorder Brother   . Obesity Brother   . COPD Brother   . Kidney disease Mother   . Heart attack Father   . Hypertension Father   . Alcohol abuse Father   . Depression Brother   . Anxiety disorder Brother   . Breast cancer Maternal Grandmother     Social History:  reports that she has been smoking cigarettes. She started smoking about 9 years ago. She has a 1.00 pack-year smoking history. She has never used smokeless tobacco. She reports that she does not drink alcohol and does not use drugs.  Allergies: No Known Allergies  Medications: I have reviewed the patient's current medications.  Results for orders placed or performed during the hospital encounter of 09/25/19 (from the past 48 hour(s))  Lipid panel     Status: None   Collection Time: 10/02/19  7:29 AM  Result Value Ref Range   Cholesterol 164 0 - 200 mg/dL   Triglycerides 117 <150 mg/dL   HDL 52 >40 mg/dL   Total CHOL/HDL Ratio 3.2 RATIO   VLDL 23 0 - 40 mg/dL   LDL Cholesterol 89 0 - 99 mg/dL    Comment:        Total Cholesterol/HDL:CHD Risk Coronary Heart Disease Risk Table                     Men   Women  1/2 Average Risk   3.4   3.3  Average Risk       5.0   4.4  2 X Average Risk   9.6   7.1  3 X Average Risk  23.4   11.0        Use the calculated Patient Ratio above and the CHD Risk Table to determine the patient's CHD Risk.        ATP III CLASSIFICATION (LDL):  <100     mg/dL   Optimal  100-129  mg/dL   Near or  Above                    Optimal  130-159  mg/dL   Borderline  160-189  mg/dL   High  >190     mg/dL   Very High Performed at [facility], [address] [place], [place] [address]   Vitamin B12     Status: None   Collection Time: 10/02/19  7:32 AM  Result Value Ref Range   Vitamin B-12 335 180 - 914 pg/mL    Comment: (NOTE) This assay is not validated for testing neonatal or myeloproliferative syndrome specimens for Vitamin B12 levels. Performed at [facility], [place] [address] [place], [place] [address]     DG Shoulder Left Port  Result Date: 10/03/2019 CLINICAL DATA:  Left shoulder pain after multiple falls. EXAM: LEFT SHOULDER  COMPARISON:  December 27, 2016. FINDINGS: Status post left total shoulder arthroplasty. The glenoid and humeral components appear to be well situated. No fracture or dislocation is noted. Ribs are unremarkable. IMPRESSION: Status post left total shoulder arthroplasty. Electronically Signed   By: Marijo Conception M.D.   On: 10/03/2019 15:37    Review of Systems Blood pressure 117/81, pulse 73, temperature 98.1 F (36.7 C), temperature source Oral, resp. rate 18, height 5\' 5"  (1.651 m), weight 54.4 kg, SpO2 100 %. Physical Exam She has active abduction about 35 degrees forward flexion 20 and extension 10.  Passive range of motion is slightly better but with significant pain at the ends of motion.  On palpation she has significant atrophy around the supraspinatus and posterior deltoid with weakness present.  She is tender over the lateral aspect of the proximal humerus with plate palpable under the deltoid. Radiographic review reveals stable appearance to implant and plate with multiple screws. Assessment/Plan: Chronic pain related to multiple shoulder surgeries.  I suspect hardware removal might relieve some of her pain but most of her pain I think is related to chronic pain syndrome related to rotator cuff tear and retraction of muscle.  I  suspect that multiple modalities will be needed to treat this.  Presently topical treatment is odd recommend and when she is improved I would recommend that she have trial of a TENS unit.  She has about CBD as a treatment and I do not have a medical support for this a either way.  Recommend Voltaren gel or icy hot treatment to the shoulder at present with TENS unit and discussion of hardware removal with her surgeon at [name] after she feels better from this admission.  Additionally she complains of difficulty with gait she has a somewhat left steppage gait and has a valgus knee deformity and I asked her to follow-up with me when she is feeling better and we can review her left hip and knee problems as an outpatient.  Hessie Knows 10/03/2019, 4:41 PM

## 2019-10-03 NOTE — [facility] Group Notes (Signed)
[place] Group Notes:  (Nursing/MHT/Case Management/Adjunct)  Date:  10/03/2019  Time:  9:48 PM  Type of Therapy:  Group Therapy  Participation Level:  Active  Participation Quality:  Appropriate  Affect:  Appropriate  Cognitive:  Alert  Insight:  Good  Engagement in Group:  Engaged and her goal was to feel less depressed.  Modes of Intervention:  Support  Summary of Progress/Problems:  [name] 10/03/2019, 9:48 PM

## 2019-10-03 NOTE — Progress Notes (Signed)
[facility] MD Progress Note  10/03/2019 12:12 PM Chelsey [name]  MRN:  [id number] Subjective: Follow-up for this 69 year old woman with severe depression.  Patient says she is still feeling depressed today.  She is able to discuss the fact that she has probably been depressed for years.  Continues to have thoughts about dying but no suicidal intent.  Continues to have a very flat withdrawn affect but is making an effort to be interactive.  She specifically today wanted to talk about the pain in her left shoulder which has been worsened since she fell against it.  She has a past history of shoulder surgery on that side.  Additionally she points out to me that her cholesterol was normal and asks if she may please switch to the regular diet. Principal Problem: Major depressive disorder, recurrent severe without psychotic features ([place]) Diagnosis: Principal Problem:   Major depressive disorder, recurrent severe without psychotic features ([place]) Active Problems:   Gastroesophageal reflux disease without esophagitis   Chronic constipation   Chronic pain syndrome  Total Time spent with patient: 30 minutes  Past Psychiatric History: Past history of severe depression recent suicide attempt  Past Medical History:  Past Medical History:  Diagnosis Date  . Breast cancer ([place]) 1997   right breast, radiation  . Bronchitis    recent/ 06/09/15 [facility] chest xray/ [facility]/resolved  . Cancer [facility]) 1997   right lumpectomy,L/Ad/R   . GERD (gastroesophageal reflux disease)   . History of hiatal hernia   . Hypercholesteremia   . Hyperlipidemia   . Low BP    TYPICALLY RUNS 80'S/60'S  . Panic attack   . Personal history of malignant neoplasm of breast     Past Surgical History:  Procedure Laterality Date  . BICEPT TENODESIS Left 11/23/2016   Procedure: BICEPS TENODESIS;  Surgeon: Leim Fabry, MD;  Location: [facility] ORS;  Service: Orthopedics;  Laterality: Left;  . BREAST EXCISIONAL BIOPSY Right 1997    pos  . BREAST SURGERY Right 1997   lumpectomy  . CHOLECYSTECTOMY N/A 08/06/2016   Procedure: LAPAROSCOPIC CHOLECYSTECTOMY WITH INTRAOPERATIVE CHOLANGIOGRAM;  Surgeon: Christene Lye, MD;  Location: [facility];  Service: General;  Laterality: N/A;  . COLONOSCOPY  2008  . COLONOSCOPY WITH PROPOFOL N/A 07/21/2015   Procedure: COLONOSCOPY WITH PROPOFOL;  Surgeon: Lucilla Lame, MD;  Location: [facility];  Service: Endoscopy;  Laterality: N/A;  . COLONOSCOPY WITH PROPOFOL N/A 01/05/2018   Procedure: COLONOSCOPY WITH PROPOFOL;  Surgeon: Jonathon Bellows, MD;  Location: [facility] ENDOSCOPY;  Service: Gastroenterology;  Laterality: N/A;  . ESOPHAGOGASTRODUODENOSCOPY (EGD) WITH PROPOFOL N/A 06/16/2016   Procedure: ESOPHAGOGASTRODUODENOSCOPY (EGD) WITH PROPOFOL;  Surgeon: Christene Lye, MD;  Location: [facility] ENDOSCOPY;  Service: Endoscopy;  Laterality: N/A;  . ESOPHAGOGASTRODUODENOSCOPY (EGD) WITH PROPOFOL N/A 01/05/2018   Procedure: ESOPHAGOGASTRODUODENOSCOPY (EGD) WITH PROPOFOL;  Surgeon: Jonathon Bellows, MD;  Location[facility];  Service: Gastroenterology;  Laterality: N/A;  . ESOPHAGOGASTRODUODENOSCOPY (EGD) WITH PROPOFOL N/A 06/13/2018   Procedure: ESOPHAGOGASTRODUODENOSCOPY (EGD) WITH PROPOFOL;  Surgeon: Jonathon Bellows, MD;  Location: [facility] ENDOSCOPY;  Service: Gastroenterology;  Laterality: N/A;  . ESOPHAGOGASTRODUODENOSCOPY (EGD) WITH PROPOFOL N/A 07/16/2018   Procedure: ESOPHAGOGASTRODUODENOSCOPY (EGD) WITH PROPOFOL;  Surgeon: Lucilla Lame, MD;  Location: [facility] ENDOSCOPY;  Service: Endoscopy;  Laterality: N/A;  . HARDWARE REMOVAL Left 12/27/2016   Procedure: HARDWARE REMOVAL LEFT  PROXIMAL HUMEROUS;  Surgeon: Leim Fabry, MD;  Location: [facility];  Service: Orthopedics;  Laterality: Left;  . IR GJ TUBE CHANGE  04/21/2018  . NISSEN  FUNDOPLICATION N/A 07/24/5282   Procedure: NISSEN FUNDOPLICATION;  Surgeon: Jules Husbands, MD;  Location: [facility];  Service: General;  Laterality: N/A;  . ORIF  HUMERUS FRACTURE Left 11/23/2016   Procedure: OPEN REDUCTION INTERNAL FIXATION (ORIF) PROXIMAL HUMERUS FRACTURE;  Surgeon: Leim Fabry, MD;  Location: [facility];  Service: Orthopedics;  Laterality: Left;  . REPAIR OF ESOPHAGUS  03/09/2018   Procedure: REPAIR OF ESOPHAGUS;  Surgeon: Jules Husbands, MD;  Location: [facility];  Service: General;;  . REVERSE SHOULDER ARTHROPLASTY Left 12/27/2016   Procedure: REVERSE SHOULDER ARTHROPLASTY;  Surgeon: Leim Fabry, MD;  Location: [facility];  Service: Orthopedics;  Laterality: Left;  . ROBOTIC ASSISTED LAPAROSCOPIC REPAIR OF PARAESOPHAGEAL HERNIA N/A 03/09/2018   Procedure: ROBOTIC HIATAL HERNIA CONVERTED TO OPEN;  Surgeon: Jules Husbands, MD;  Location: [facility];  Service: General;  Laterality: N/A;   Family History:  Family History  Problem Relation Age of Onset  . Anxiety disorder Brother   . Obesity Brother   . COPD Brother   . Kidney disease Mother   . Heart attack Father   . Hypertension Father   . Alcohol abuse Father   . Depression Brother   . Anxiety disorder Brother   . Breast cancer Maternal Grandmother    Family Psychiatric  History: See previous Social History:  Social History   Substance and Sexual Activity  Alcohol Use No  . Alcohol/week: 0.0 standard drinks     Social History   Substance and Sexual Activity  Drug Use No    Social History   Socioeconomic History  . Marital status: Widowed    Spouse name: Not on file  . Number of children: 2  . Years of education: Not on file  . Highest education level: Bachelor's degree (e.g., BA, AB, BS)  Occupational History  . Not on file  Tobacco Use  . Smoking status: Current Some Day Smoker    Packs/day: 0.10    Years: 10.00    Pack years: 1.00    Types: Cigarettes    Start date: 11/25/2009  . Smokeless tobacco: Never Used  . Tobacco comment: 1 cigarette daily  Vaping Use  . Vaping Use: Never used  Substance and Sexual Activity  . Alcohol use: No    Alcohol/week: 0.0  standard drinks  . Drug use: No  . Sexual activity: Never  Other Topics Concern  . Not on file  Social History Narrative  . Not on file   Social Determinants of Health   Financial Resource Strain:   . Difficulty of Paying Living Expenses: Not on file  Food Insecurity:   . Worried About Charity fundraiser in the Last Year: Not on file  . Ran Out of Food in the Last Year: Not on file  Transportation Needs:   . Lack of Transportation (Medical): Not on file  . Lack of Transportation (Non-Medical): Not on file  Physical Activity:   . Days of Exercise per Week: Not on file  . Minutes of Exercise per Session: Not on file  Stress:   . Feeling of Stress : Not on file  Social Connections:   . Frequency of Communication with Friends and Family: Not on file  . Frequency of Social Gatherings with Friends and Family: Not on file  . Attends Religious Services: Not on file  . Active Member of Clubs or Organizations: Not on file  . Attends Archivist Meetings: Not on file  . Marital Status: Not on  file   Additional Social History:                         Sleep: Fair  Appetite:  Fair  Current Medications: Current Facility-Administered Medications  Medication Dose Route Frequency Provider Last Rate Last Admin  . acetaminophen (TYLENOL) tablet 650 mg  650 mg Oral Q6H PRN Patrecia Pour, NP   650 mg at 10/02/19 2131  . alum & mag hydroxide-simeth (MAALOX/MYLANTA) 200-200-20 MG/5ML suspension 30 mL  30 mL Oral Q4H PRN Patrecia Pour, NP      . busPIRone (BUSPAR) tablet 10 mg  10 mg Oral TID Shirlie Enck, Madie Reno, MD   10 mg at 10/03/19 1207  . docusate sodium (COLACE) capsule 100 mg  100 mg Oral BID Meshell Abdulaziz, Madie Reno, MD   100 mg at 10/03/19 3664  . ibuprofen (ADVIL) tablet 600 mg  600 mg Oral Q6H PRN Deavon Podgorski, Madie Reno, MD   600 mg at 10/03/19 1207  . linaclotide (LINZESS) capsule 72 mcg  72 mcg Oral Q0600 Hampton Abbot, MD   72 mcg at 10/03/19 0525  . magnesium hydroxide (MILK  OF MAGNESIA) suspension 30 mL  30 mL Oral Daily PRN Patrecia Pour, NP      . OLANZapine (ZYPREXA) tablet 10 mg  10 mg Oral QHS Opha Mcghee, Madie Reno, MD   10 mg at 10/02/19 2132  . pantoprazole (PROTONIX) EC tablet 40 mg  40 mg Oral Daily Patrecia Pour, NP   40 mg at 10/03/19 4034  . PARoxetine (PAXIL) tablet 40 mg  40 mg Oral QAC supper Eulas Post, MD   40 mg at 10/02/19 1704  . polyethylene glycol (MIRALAX / GLYCOLAX) packet 17 g  17 g Oral Daily Patrecia Pour, NP   17 g at 10/03/19 7425  . traZODone (DESYREL) tablet 100 mg  100 mg Oral QHS Aerica Rincon, Madie Reno, MD   100 mg at 10/02/19 2132    Lab Results:  Results for orders placed or performed during the hospital encounter of 09/25/19 (from the past 48 hour(s))  Lipid panel     Status: None   Collection Time: 10/02/19  7:29 AM  Result Value Ref Range   Cholesterol 164 0 - 200 mg/dL   Triglycerides 117 <150 mg/dL   HDL 52 >40 mg/dL   Total CHOL/HDL Ratio 3.2 RATIO   VLDL 23 0 - 40 mg/dL   LDL Cholesterol 89 0 - 99 mg/dL    Comment:        Total Cholesterol/HDL:CHD Risk Coronary Heart Disease Risk Table                     Men   Women  1/2 Average Risk   3.4   3.3  Average Risk       5.0   4.4  2 X Average Risk   9.6   7.1  3 X Average Risk  23.4   11.0        Use the calculated Patient Ratio above and the CHD Risk Table to determine the patient's CHD Risk.        ATP III CLASSIFICATION (LDL):  <100     mg/dL   Optimal  100-129  mg/dL   Near or Above                    Optimal  130-159  mg/dL   Borderline  160-189  mg/dL  High  >190     mg/dL   Very High Performed at [facility], [address] [place], [place] [address]   Vitamin B12     Status: None   Collection Time: 10/02/19  7:32 AM  Result Value Ref Range   Vitamin B-12 335 180 - 914 pg/mL    Comment: (NOTE) This assay is not validated for testing neonatal or myeloproliferative syndrome specimens for Vitamin B12 levels. Performed at [facility], [place] [address], [place], [place] [address]     Blood Alcohol level:  Lab Results  Component Value Date   ETH <10 09/24/2019   ETH <10 02/58/5277    Metabolic Disorder Labs: No results found for: HGBA1C, MPG No results found for: PROLACTIN Lab Results  Component Value Date   CHOL 164 10/02/2019   TRIG 117 10/02/2019   HDL 52 10/02/2019   CHOLHDL 3.2 10/02/2019   VLDL 23 10/02/2019   LDLCALC 89 10/02/2019   LDLCALC 87 07/27/2017    Physical Findings: AIMS:  , ,  ,  ,    CIWA:    COWS:     Musculoskeletal: Strength & Muscle Tone: within normal limits Gait & Station: normal Patient leans: N/A  Psychiatric Specialty Exam: Physical Exam Constitutional:      Appearance: She is well-developed.  HENT:     Head: Normocephalic and atraumatic.  Eyes:     Conjunctiva/sclera: Conjunctivae normal.     Pupils: Pupils are equal, round, and reactive to light.  Cardiovascular:     Heart sounds: Normal heart sounds.  Pulmonary:     Effort: Pulmonary effort is normal.  Abdominal:     Palpations: Abdomen is soft.  Musculoskeletal:        General: Normal range of motion.     Cervical back: Normal range of motion.  Skin:    General: Skin is warm and dry.  Neurological:     General: No focal deficit present.     Mental Status: She is alert.  Psychiatric:        Attention and Perception: Attention normal.        Mood and Affect: Mood is depressed.        Speech: Speech is delayed.        Behavior: Behavior is slowed and withdrawn.        Thought Content: Thought content includes suicidal ideation. Thought content does not include suicidal plan.        Cognition and Memory: Cognition normal.        Judgment: Judgment normal.     Review of Systems  Constitutional: Negative.   HENT: Negative.   Eyes: Negative.   Respiratory: Negative.   Cardiovascular: Negative.   Gastrointestinal: Negative.   Musculoskeletal:       Patient complains of pain and limited range  of motion in her left shoulder  Skin: Negative.   Neurological: Negative.   Psychiatric/Behavioral: Positive for decreased concentration, dysphoric mood and suicidal ideas. Negative for agitation, behavioral problems, confusion, hallucinations and sleep disturbance. The patient is nervous/anxious.     Blood pressure 117/81, pulse 73, temperature 98.1 F (36.7 C), temperature source Oral, resp. rate 18, height 5\' 5"  (1.651 m), weight 54.4 kg, SpO2 100 %.Body mass index is 19.96 kg/m.  General Appearance: Casual  Eye Contact:  Fair  Speech:  Slow  Volume:  Decreased  Mood:  Depressed and Dysphoric  Affect:  Congruent  Thought Process:  Coherent  Orientation:  Full (  Time, Place, and Person)  Thought Content:  Logical and Rumination  Suicidal Thoughts:  Yes.  without intent/plan  Homicidal Thoughts:  No  Memory:  Immediate;   Fair Recent;   Fair Remote;   Fair  Judgement:  Fair  Insight:  Fair  Psychomotor Activity:  Decreased  Concentration:  Concentration: Fair  Recall:  [employer] of Knowledge:  Fair  Language:  Fair  Akathisia:  No  Handed:  Right  AIMS (if indicated):     Assets:  Desire for Improvement Housing Resilience  ADL's:  Intact  Cognition:  WNL  Sleep:  Number of Hours: 7.45     Treatment Plan Summary: Daily contact with patient to assess and evaluate symptoms and progress in treatment, Medication management and Plan Patient subjectively says that she might be a little bit better but cannot really describe how.  Still has suicidal thoughts.  Good insight.  No psychosis.  No indication today to change antidepressant medicine.  We once again discussed ECT which is still an option but could not start until next week.  I put in a consult to orthopedics who will see the patient later today for her worsening shoulder pain.  Alethia Berthold, MD 10/03/2019, 12:12 PM

## 2019-10-03 NOTE — Progress Notes (Signed)
Patient is pleasant and cooperative. She denies SI/HI/AVH, anxiety and pain at this encounter. She does endorse depression, but states she is managing her symptoms.  She is med compliant and tolerated her meds without incident. She remains safe on the unit with 15 minute safety rounds and informed to contact staff with any concerns.      Cleo Butler-Nicholson, LPN

## 2019-10-04 NOTE — Progress Notes (Signed)
Recreation Therapy Notes  Date: 10/04/2019  Time: 9:30 am   Location: Craft room  Behavioral response: Appropriate  Intervention Topic: Relaxation    Discussion/Intervention:  Group content today was focused on relaxation. The group defined relaxation and identified healthy ways to relax. Individuals expressed how much time they spend relaxing. Patients expressed how much their life would be if they did not make time for themselves to relax. The group stated ways they could improve their relaxation techniques in the future.  Individuals participated in the intervention "Time to Relax" where they had a chance to experience different relaxation techniques.   Clinical Observations/Feedback:  Patient came to group and was focused on what peers and staff had to say about relaxation. Individual was social with peers and staff while participating in the intervention.  Chelsey [name] LRT/CTRS         Kennieth Plotts 10/04/2019 12:50 PM

## 2019-10-04 NOTE — Progress Notes (Signed)
Patient presents with flat affect, brightens on approach. Denies SI, Hi, AVH. Medication compliant. Appropriate with staff and peers. Visible in milieu. Request trazodone for sleep, given with good relief.  Encouragement and support provided. Safety checks maintained. Medications given as prescribed. Pt receptive and remains safe on unit with q 15 min checks.

## 2019-10-04 NOTE — Progress Notes (Signed)
Patient calm and pleasant during assessment denying SI/HI/AVH. Patient endorses pain, 6/10 (check MAR). Patient endorses depression. Patient compliant with medication administration per MD orders. Pt given education, support, and encouragement to be active in her treatment plan. Patient observed by this Probation officer interacting appropriately with staff and peers on the unit. Patient being monitored Q 15 minutes for safety per unit protocol. Pt remains safe on the unit.

## 2019-10-04 NOTE — Plan of Care (Signed)
  Problem: Education: Goal: Emotional status will improve Outcome: Progressing Goal: Mental status will improve Outcome: Progressing Goal: Verbalization of understanding the information provided will improve Outcome: Progressing   Problem: Safety: Goal: Periods of time without injury will increase Outcome: Progressing   

## 2019-10-04 NOTE — Plan of Care (Signed)
Patient endorsing anxiety/depression   Problem: Education: Goal: Emotional status will improve Outcome: Not Progressing Goal: Mental status will improve Outcome: Not Progressing

## 2019-10-04 NOTE — Progress Notes (Signed)
[facility] MD Progress Note  10/04/2019 4:43 PM Chelsey [name]  MRN:  [id number] Subjective: Follow-up for this patient with depression.  Says she is feeling slightly better.  Constant thoughts of death but no suicidal intent.  Patient expresses that she is looking forward to receiving ECT.  We discussed the option of outpatient ECT but she has no family or anyone else who would be able to provide any transportation Principal Problem: Major depressive disorder, recurrent severe without psychotic features ([place]) Diagnosis: Principal Problem:   Major depressive disorder, recurrent severe without psychotic features ([place]) Active Problems:   Gastroesophageal reflux disease without esophagitis   Chronic constipation   Chronic pain syndrome  Total Time spent with patient: 30 minutes  Past Psychiatric History: Past history of recurrent depression  Past Medical History:  Past Medical History:  Diagnosis Date  . Breast cancer ([employer]) 1997   right breast, radiation  . Bronchitis    recent/ 06/09/15 had chest xray/ [facility]/resolved  . Cancer [facility]) 1997   right lumpectomy,L/Ad/R   . GERD (gastroesophageal reflux disease)   . History of hiatal hernia   . Hypercholesteremia   . Hyperlipidemia   . Low BP    TYPICALLY RUNS 80'S/60'S  . Panic attack   . Personal history of malignant neoplasm of breast     Past Surgical History:  Procedure Laterality Date  . BICEPT TENODESIS Left 11/23/2016   Procedure: BICEPS TENODESIS;  Surgeon: Leim Fabry, MD;  Location: [facility];  Service: Orthopedics;  Laterality: Left;  . BREAST EXCISIONAL BIOPSY Right 1997   pos  . BREAST SURGERY Right 1997   lumpectomy  . CHOLECYSTECTOMY N/A 08/06/2016   Procedure: LAPAROSCOPIC CHOLECYSTECTOMY WITH INTRAOPERATIVE CHOLANGIOGRAM;  Surgeon: Christene Lye, MD;  Location: [facility];  Service: General;  Laterality: N/A;  . COLONOSCOPY  2008  . COLONOSCOPY WITH PROPOFOL N/A 07/21/2015   Procedure: COLONOSCOPY  WITH PROPOFOL;  Surgeon: Lucilla Lame, MD;  Location: [place];  Service: Endoscopy;  Laterality: N/A;  . COLONOSCOPY WITH PROPOFOL N/A 01/05/2018   Procedure: COLONOSCOPY WITH PROPOFOL;  Surgeon: Jonathon Bellows, MD;  Location: [facility] ENDOSCOPY;  Service: Gastroenterology;  Laterality: N/A;  . ESOPHAGOGASTRODUODENOSCOPY (EGD) WITH PROPOFOL N/A 06/16/2016   Procedure: ESOPHAGOGASTRODUODENOSCOPY (EGD) WITH PROPOFOL;  Surgeon: Christene Lye, MD;  Location: [facility] ENDOSCOPY;  Service: Endoscopy;  Laterality: N/A;  . ESOPHAGOGASTRODUODENOSCOPY (EGD) WITH PROPOFOL N/A 01/05/2018   Procedure: ESOPHAGOGASTRODUODENOSCOPY (EGD) WITH PROPOFOL;  Surgeon: Jonathon Bellows, MD;  Location: [facility] ENDOSCOPY;  Service: Gastroenterology;  Laterality: N/A;  . ESOPHAGOGASTRODUODENOSCOPY (EGD) WITH PROPOFOL N/A 06/13/2018   Procedure: ESOPHAGOGASTRODUODENOSCOPY (EGD) WITH PROPOFOL;  Surgeon: Jonathon Bellows, MD;  Location: [facility] ENDOSCOPY;  Service: Gastroenterology;  Laterality: N/A;  . ESOPHAGOGASTRODUODENOSCOPY (EGD) WITH PROPOFOL N/A 07/16/2018   Procedure: ESOPHAGOGASTRODUODENOSCOPY (EGD) WITH PROPOFOL;  Surgeon: Lucilla Lame, MD;  Location: [facility] ENDOSCOPY;  Service: Endoscopy;  Laterality: N/A;  . HARDWARE REMOVAL Left 12/27/2016   Procedure: HARDWARE REMOVAL LEFT  PROXIMAL HUMEROUS;  Surgeon: Leim Fabry, MD;  Location: [facility];  Service: Orthopedics;  Laterality: Left;  . IR GJ TUBE CHANGE  04/21/2018  . NISSEN FUNDOPLICATION N/A 04/22/9240   Procedure: NISSEN FUNDOPLICATION;  Surgeon: Jules Husbands, MD;  Location: [facility];  Service: General;  Laterality: N/A;  . ORIF HUMERUS FRACTURE Left 11/23/2016   Procedure: OPEN REDUCTION INTERNAL FIXATION (ORIF) PROXIMAL HUMERUS FRACTURE;  Surgeon: Leim Fabry, MD;  Location: [facility];  Service: Orthopedics;  Laterality: Left;  . REPAIR OF ESOPHAGUS  03/09/2018  Procedure: REPAIR OF ESOPHAGUS;  Surgeon: Jules Husbands, MD;  Location: [facility];  Service: General;;  .  REVERSE SHOULDER ARTHROPLASTY Left 12/27/2016   Procedure: REVERSE SHOULDER ARTHROPLASTY;  Surgeon: Leim Fabry, MD;  Location: [facility];  Service: Orthopedics;  Laterality: Left;  . ROBOTIC ASSISTED LAPAROSCOPIC REPAIR OF PARAESOPHAGEAL HERNIA N/A 03/09/2018   Procedure: ROBOTIC HIATAL HERNIA CONVERTED TO OPEN;  Surgeon: Jules Husbands, MD;  Location: [facility];  Service: General;  Laterality: N/A;   Family History:  Family History  Problem Relation Age of Onset  . Anxiety disorder Brother   . Obesity Brother   . COPD Brother   . Kidney disease Mother   . Heart attack Father   . Hypertension Father   . Alcohol abuse Father   . Depression Brother   . Anxiety disorder Brother   . Breast cancer Maternal Grandmother    Family Psychiatric  History: See previous Social History:  Social History   Substance and Sexual Activity  Alcohol Use No  . Alcohol/week: 0.0 standard drinks     Social History   Substance and Sexual Activity  Drug Use No    Social History   Socioeconomic History  . Marital status: Widowed    Spouse name: Not on file  . Number of children: 2  . Years of education: Not on file  . Highest education level: Bachelor's degree (e.g., BA, AB, BS)  Occupational History  . Not on file  Tobacco Use  . Smoking status: Current Some Day Smoker    Packs/day: 0.10    Years: 10.00    Pack years: 1.00    Types: Cigarettes    Start date: 11/25/2009  . Smokeless tobacco: Never Used  . Tobacco comment: 1 cigarette daily  Vaping Use  . Vaping Use: Never used  Substance and Sexual Activity  . Alcohol use: No    Alcohol/week: 0.0 standard drinks  . Drug use: No  . Sexual activity: Never  Other Topics Concern  . Not on file  Social History Narrative  . Not on file   Social Determinants of Health   Financial Resource Strain:   . Difficulty of Paying Living Expenses: Not on file  Food Insecurity:   . Worried About Charity fundraiser in the Last Year: Not on file   . Ran Out of Food in the Last Year: Not on file  Transportation Needs:   . Lack of Transportation (Medical): Not on file  . Lack of Transportation (Non-Medical): Not on file  Physical Activity:   . Days of Exercise per Week: Not on file  . Minutes of Exercise per Session: Not on file  Stress:   . Feeling of Stress : Not on file  Social Connections:   . Frequency of Communication with Friends and Family: Not on file  . Frequency of Social Gatherings with Friends and Family: Not on file  . Attends Religious Services: Not on file  . Active Member of Clubs or Organizations: Not on file  . Attends Archivist Meetings: Not on file  . Marital Status: Not on file   Additional Social History:                         Sleep: Fair  Appetite:  Fair  Current Medications: Current Facility-Administered Medications  Medication Dose Route Frequency Provider Last Rate Last Admin  . acetaminophen (TYLENOL) tablet 650 mg  650 mg Oral Q6H PRN Lord,  Asa Saunas, NP   650 mg at 10/02/19 2131  . alum & mag hydroxide-simeth (MAALOX/MYLANTA) 200-200-20 MG/5ML suspension 30 mL  30 mL Oral Q4H PRN Patrecia Pour, NP      . busPIRone (BUSPAR) tablet 10 mg  10 mg Oral TID Adorian Gwynne, Madie [place], MD   10 mg at 10/04/19 1230  . docusate sodium (COLACE) capsule 100 mg  100 mg Oral BID Iridian Reader, Madie [place], MD   100 mg at 10/04/19 0834  . ibuprofen (ADVIL) tablet 600 mg  600 mg Oral Q6H PRN Inetta Dicke, Madie [place], MD   600 mg at 10/03/19 1207  . linaclotide (LINZESS) capsule 72 mcg  72 mcg Oral Q0600 Hampton Abbot, MD   72 mcg at 10/04/19 0615  . magnesium hydroxide (MILK OF MAGNESIA) suspension 30 mL  30 mL Oral Daily PRN Patrecia Pour, NP      . OLANZapine (ZYPREXA) tablet 10 mg  10 mg Oral QHS Chaela Branscum, Madie [place], MD   10 mg at 10/03/19 2116  . pantoprazole (PROTONIX) EC tablet 40 mg  40 mg Oral Daily Patrecia Pour, NP   40 mg at 10/04/19 0834  . PARoxetine (PAXIL) tablet 40 mg  40 mg Oral QAC supper Eulas Post, MD   40 mg at 10/03/19 1701  . polyethylene glycol (MIRALAX / GLYCOLAX) packet 17 g  17 g Oral Daily Patrecia Pour, NP   17 g at 10/04/19 0834  . senna-docusate (Senokot-S) tablet 2 tablet  2 tablet Oral BID PRN Mozell Hardacre T, MD      . traZODone (DESYREL) tablet 100 mg  100 mg Oral QHS Jamielyn Petrucci, Madie [place], MD   100 mg at 10/03/19 2116    Lab Results: No results found for this or any previous visit (from the past 48 hour(s)).  Blood Alcohol level:  Lab Results  Component Value Date   ETH <10 09/24/2019   ETH <10 52/84/1324    Metabolic Disorder Labs: No results found for: HGBA1C, MPG No results found for: PROLACTIN Lab Results  Component Value Date   CHOL 164 10/02/2019   TRIG 117 10/02/2019   HDL 52 10/02/2019   CHOLHDL 3.2 10/02/2019   VLDL 23 10/02/2019   LDLCALC 89 10/02/2019   LDLCALC 87 07/27/2017    Physical Findings: AIMS:  , ,  ,  ,    CIWA:    COWS:     Musculoskeletal: Strength & Muscle Tone: within normal limits Gait & Station: normal Patient leans: N/A  Psychiatric Specialty Exam: Physical Exam Vitals and nursing note reviewed.  Constitutional:      Appearance: She is well-developed.  HENT:     Head: Normocephalic and atraumatic.  Eyes:     Conjunctiva/sclera: Conjunctivae normal.     Pupils: Pupils are equal, round, and reactive to light.  Cardiovascular:     Heart sounds: Normal heart sounds.  Pulmonary:     Effort: Pulmonary effort is normal.  Abdominal:     Palpations: Abdomen is soft.  Musculoskeletal:        General: Normal range of motion.     Cervical back: Normal range of motion.  Skin:    General: Skin is warm and dry.  Neurological:     General: No focal deficit present.     Mental Status: She is alert.  Psychiatric:        Attention and Perception: Attention normal.        Mood and Affect: Mood is depressed.  Speech: Speech is delayed.        Behavior: Behavior is slowed and withdrawn.        Thought  Content: Thought content includes suicidal ideation. Thought content does not include suicidal plan.        Cognition and Memory: Cognition is impaired.     Review of Systems  Constitutional: Negative.   HENT: Negative.   Eyes: Negative.   Respiratory: Negative.   Cardiovascular: Negative.   Gastrointestinal: Negative.   Musculoskeletal: Negative.   Skin: Negative.   Neurological: Negative.   Psychiatric/Behavioral: Positive for dysphoric mood. The patient is nervous/anxious.     Blood pressure 92/65, pulse 81, temperature 98.1 F (36.7 C), temperature source Oral, resp. rate 18, height 5\' 5"  (1.651 m), weight 54.4 kg, SpO2 98 %.Body mass index is 19.96 kg/m.  General Appearance: Casual  Eye Contact:  Fair  Speech:  Slow  Volume:  Decreased  Mood:  Depressed  Affect:  Depressed  Thought Process:  Disorganized  Orientation:  Full (Time, Place, and Person)  Thought Content:  Rumination  Suicidal Thoughts:  Yes.  without intent/plan  Homicidal Thoughts:  No  Memory:  Immediate;   Fair Recent;   Poor Remote;   Poor  Judgement:  Impaired  Insight:  Shallow  Psychomotor Activity:  Normal  Concentration:  Concentration: Fair  Recall:  [employer] of Knowledge:  Fair  Language:  Fair  Akathisia:  No  Handed:  Right  AIMS (if indicated):     Assets:  Desire for Improvement Housing  ADL's:  Impaired  Cognition:  Impaired,  Mild  Sleep:  Number of Hours: 6.15     Treatment Plan Summary: Daily contact with patient to assess and evaluate symptoms and progress in treatment, Medication management and Plan Patient is wanting to get ECT.  She has no one who would be able to bring her in for outpatient treatment.  We will reassess over the next couple days for appropriate treatment planning.  No change to medicine today.  Shoulder was seen by orthopedics no specific change to treatment plan.  Alethia Berthold, MD 10/04/2019, 4:44 PM

## 2019-10-04 NOTE — Plan of Care (Signed)
D: Pt alert and oriented. Pt rates depression 6/10, hopelessness 6/10, and anxiety 5/10. Pt goal: "get all the counseling and group activities." Pt reports energy level as low and concentration as being good. Pt reports sleep last night as being fair. Pt did receive medications for sleep and did find them helpful. Pt reports experiencing 6/10 abdominal pain, pt states after receiving scheduled morning medication that abdominal pain was feeling better. Pt denies experiencing any SI/HI, or AVH at this time.   A: Scheduled medications administered to pt, per MD orders. Support and encouragement provided. Frequent verbal contact made. Routine safety checks conducted q15 minutes.   R: No adverse drug reactions noted. Pt verbally contracts for safety at this time. Pt complaint with medications and treatment plan. Pt interacts well with others on the unit. Pt remains safe at this time. Will continue to monitor.   Problem: Education: Goal: Knowledge of [facility] Education information/materials will improve Outcome: Not Progressing Goal: Emotional status will improve Outcome: Not Progressing Goal: Mental status will improve Outcome: Not Progressing Goal: Verbalization of understanding the information provided will improve Outcome: Not Progressing   Problem: Safety: Goal: Periods of time without injury will increase Outcome: Not Progressing   Problem: Education: Goal: Ability to make informed decisions regarding treatment will improve Outcome: Not Progressing   Problem: Self-Concept: Goal: Ability to disclose and discuss suicidal ideas will improve Outcome: Not Progressing Goal: Will verbalize positive feelings about self Outcome: Not Progressing   Problem: Education: Goal: Utilization of techniques to improve thought processes will improve Outcome: Not Progressing Goal: Knowledge of the prescribed therapeutic regimen will improve Outcome: Not Progressing   Problem:  Activity: Goal: Interest or engagement in leisure activities will improve Outcome: Not Progressing Goal: Imbalance in normal sleep/wake cycle will improve Outcome: Not Progressing   Problem: Coping: Goal: Coping ability will improve Outcome: Not Progressing Goal: Will verbalize feelings Outcome: Not Progressing   Problem: Health Behavior/Discharge Planning: Goal: Ability to make decisions will improve Outcome: Not Progressing Goal: Compliance with therapeutic regimen will improve Outcome: Not Progressing   Problem: Role Relationship: Goal: Will demonstrate positive changes in social behaviors and relationships Outcome: Not Progressing   Problem: Safety: Goal: Ability to disclose and discuss suicidal ideas will improve Outcome: Not Progressing Goal: Ability to identify and utilize support systems that promote safety will improve Outcome: Not Progressing   Problem: Self-Concept: Goal: Will verbalize positive feelings about self Outcome: Not Progressing Goal: Level of anxiety will decrease Outcome: Not Progressing   Problem: Education: Goal: Ability to state activities that reduce stress will improve Outcome: Not Progressing   Problem: Coping: Goal: Ability to identify and develop effective coping behavior will improve Outcome: Not Progressing   Problem: Self-Concept: Goal: Ability to identify factors that promote anxiety will improve Outcome: Not Progressing Goal: Level of anxiety will decrease Outcome: Not Progressing Goal: Ability to modify response to factors that promote anxiety will improve Outcome: Not Progressing

## 2019-10-05 DIAGNOSIS — M21372 Foot drop, left foot: Secondary | ICD-10-CM

## 2019-10-05 LAB — SARS CORONAVIRUS 2 BY RT PCR (HOSPITAL ORDER, PERFORMED IN ~~LOC~~ HOSPITAL LAB): SARS Coronavirus 2: NEGATIVE

## 2019-10-05 NOTE — Consult Note (Signed)
NEURO HOSPITALIST CONSULT NOTE   Requestig physician: Dr. Weber Cooks  Reason for Consult: Patient symptoms of new left foot drop and weakness below the left knee  History obtained from:   Patient and Chart    HPI:                                                                                                                                          Chelsey [name] is an 69 y.o. female admitted for chronic MDD with passive SI. Also was complaining of poor concentration, fatigue, low appetite and poor sleep. ECT is anticipated for next week. She has a history of somatic complaints in association with her depression. Recently had an inpatient Orthopedics consult for shoulder pain. During rounds by the Psychiatry service today, she complainted of new onset foot drop. Per Psychiatry note: "Also now complaining of foot drop on the left side which she says is new onset this week and getting worse with subjective weakness.  Watching her ambulate she does look like she is starting to drag the left foot and has something of a irregular gait as a result."  Neurology was consulted for further evaluation. The patient clarifies that her left foot drop has been present for about 2 weeks. She thinks it may have occurred after a fall.   Past Medical History:  Diagnosis Date  . Breast cancer ([place]) 1997   right breast, radiation  . Bronchitis    recent/ 06/09/15 [facility]/resolved  . Cancer [facility]) 1997   right lumpectomy,L/Ad/R   . GERD (gastroesophageal reflux disease)   . History of hiatal hernia   . Hypercholesteremia   . Hyperlipidemia   . Low BP    TYPICALLY RUNS 80'S/60'S  . Panic attack   . Personal history of malignant neoplasm of breast     Past Surgical History:  Procedure Laterality Date  . BICEPT TENODESIS Left 11/23/2016   Procedure: BICEPS TENODESIS;  Surgeon: Leim Fabry, MD;  Location: [facility];  Service: Orthopedics;  Laterality: Left;  .  BREAST EXCISIONAL BIOPSY Right 1997   pos  . BREAST SURGERY Right 1997   lumpectomy  . CHOLECYSTECTOMY N/A 08/06/2016   Procedure: LAPAROSCOPIC CHOLECYSTECTOMY WITH INTRAOPERATIVE CHOLANGIOGRAM;  Surgeon: Christene Lye, MD;  Location: [facility];  Service: General;  Laterality: N/A;  . COLONOSCOPY  2008  . COLONOSCOPY WITH PROPOFOL N/A 07/21/2015   Procedure: COLONOSCOPY WITH PROPOFOL;  Surgeon: Lucilla Lame, MD;  Location: [place];  Service: Endoscopy;  Laterality: N/A;  . COLONOSCOPY WITH PROPOFOL N/A 01/05/2018   Procedure: COLONOSCOPY WITH PROPOFOL;  Surgeon: Jonathon Bellows, MD;  Location: [facility] ENDOSCOPY;  Service: Gastroenterology;  Laterality: N/A;  . ESOPHAGOGASTRODUODENOSCOPY (EGD) WITH PROPOFOL N/A 06/16/2016   Procedure: ESOPHAGOGASTRODUODENOSCOPY (EGD) WITH  PROPOFOL;  Surgeon: Christene Lye, MD;  Location: [facility] ENDOSCOPY;  Service: Endoscopy;  Laterality: N/A;  . ESOPHAGOGASTRODUODENOSCOPY (EGD) WITH PROPOFOL N/A 01/05/2018   Procedure: ESOPHAGOGASTRODUODENOSCOPY (EGD) WITH PROPOFOL;  Surgeon: Jonathon Bellows, MD;  Location: [facility] ENDOSCOPY;  Service: Gastroenterology;  Laterality: N/A;  . ESOPHAGOGASTRODUODENOSCOPY (EGD) WITH PROPOFOL N/A 06/13/2018   Procedure: ESOPHAGOGASTRODUODENOSCOPY (EGD) WITH PROPOFOL;  Surgeon: Jonathon Bellows, MD;  Location: [facility] ENDOSCOPY;  Service: Gastroenterology;  Laterality: N/A;  . ESOPHAGOGASTRODUODENOSCOPY (EGD) WITH PROPOFOL N/A 07/16/2018   Procedure: ESOPHAGOGASTRODUODENOSCOPY (EGD) WITH PROPOFOL;  Surgeon: Lucilla Lame, MD;  Location: [facility] ENDOSCOPY;  Service: Endoscopy;  Laterality: N/A;  . HARDWARE REMOVAL Left 12/27/2016   Procedure: HARDWARE REMOVAL LEFT  PROXIMAL HUMEROUS;  Surgeon: Leim Fabry, MD;  Location: [facility];  Service: Orthopedics;  Laterality: Left;  . IR GJ TUBE CHANGE  04/21/2018  . NISSEN FUNDOPLICATION N/A 07/22/3660   Procedure: NISSEN FUNDOPLICATION;  Surgeon: Jules Husbands, MD;  Location: [facility];  Service:  General;  Laterality: N/A;  . ORIF HUMERUS FRACTURE Left 11/23/2016   Procedure: OPEN REDUCTION INTERNAL FIXATION (ORIF) PROXIMAL HUMERUS FRACTURE;  Surgeon: Leim Fabry, MD;  Location: [facility];  Service: Orthopedics;  Laterality: Left;  . REPAIR OF ESOPHAGUS  03/09/2018   Procedure: REPAIR OF ESOPHAGUS;  Surgeon: Jules Husbands, MD;  Location: [facility];  Service: General;;  . REVERSE SHOULDER ARTHROPLASTY Left 12/27/2016   Procedure: REVERSE SHOULDER ARTHROPLASTY;  Surgeon: Leim Fabry, MD;  Location: [facility];  Service: Orthopedics;  Laterality: Left;  . ROBOTIC ASSISTED LAPAROSCOPIC REPAIR OF PARAESOPHAGEAL HERNIA N/A 03/09/2018   Procedure: ROBOTIC HIATAL HERNIA CONVERTED TO OPEN;  Surgeon: Jules Husbands, MD;  Location: [facility];  Service: General;  Laterality: N/A;    Family History  Problem Relation Age of Onset  . Anxiety disorder Brother   . Obesity Brother   . COPD Brother   . Kidney disease Mother   . Heart attack Father   . Hypertension Father   . Alcohol abuse Father   . Depression Brother   . Anxiety disorder Brother   . Breast cancer Maternal Grandmother               Social History:  reports that she has been smoking cigarettes. She started smoking about 9 years ago. She has a 1.00 pack-year smoking history. She has never used smokeless tobacco. She reports that she does not drink alcohol and does not use drugs.  No Known Allergies  MEDICATIONS:                                                                                                                     Scheduled: . busPIRone  10 mg Oral TID  . docusate sodium  100 mg Oral BID  . linaclotide  72 mcg Oral Q0600  . OLANZapine  10 mg Oral QHS  . pantoprazole  40 mg Oral Daily  . PARoxetine  40 mg Oral QAC supper  . polyethylene glycol  17 g Oral  Daily  . traZODone  100 mg Oral QHS     ROS:                                                                                                                                        Positive for left shoulder pain and weakness in the context of prior injury and surgery. Other ROS as per HPI. Does not endorse additional symptoms.    Blood pressure 90/61, pulse 74, temperature 98.4 F (36.9 C), temperature source Oral, resp. rate 18, height 5\' 5"  (1.651 m), weight 54.4 kg, SpO2 99 %.   General Examination:                                                                                                       Physical Exam  HEENT-  Force/AT   Lungs- Respirations unlabored Extremities- Onychomycosis and tinea pedis bilaterally   Neurological Examination Mental Status: Awake and alert. Oriented x 5. Mildly flattened, dysthymic affect. Speech fluent with intact naming and comprehension. Able to follow all commands and answer all questions. Slightly diminished eye contact.  Cranial Nerves: II: Visual fields grossly normal with no extinction to DSS. Fixates normally. PERRL.   III,IV, VI: No ptosis. EOMI. No nystagmus.  V,VII: Smile symmetric, facial temp sensation mildly decreased on the left  VIII: hearing intact to voice IX,X: No hypophonia XI: Symmetric XII: Midline tongue extension Motor: RUE 5/5 proximally and distally LUE with 3-4/5 deltoid (chronic per patient), 4+/5 biceps/triceps (some pain in shoulder), 5/5 grip RLE 5/5 proximally and distally LLE 5/5 HF, KE, KF and foot plantar flexion and inversion. Foot dorsiflexion and eversion are 2/5 Sensory: Temp sensation mildly decreased to LUE and LLE. Decreased fine touch sensation to lateral aspect of left calf and dorsum of left foot.  Deep Tendon Reflexes: 2+ and symmetric biceps, brachioradialis, patellae and achilles Plantars: Right: downgoing  Left: downgoing Cerebellar: No ataxia with FNF bilaterally Gait: Ambulates without assistance. Mildly hunched posture and decreased armswing. No instability. No wrist or hand tremor elicited by ambulation. Left foot drop noted with some dragging. Left foot  also slightly externally rotated.    Lab Results: Basic Metabolic Panel: No results for input(s): NA, K, CL, CO2, GLUCOSE, BUN, CREATININE, CALCIUM, MG, PHOS in the last 168 hours.  CBC: No results for input(s): WBC, NEUTROABS, HGB, HCT, MCV, PLT in the last 168 hours.  Cardiac Enzymes: No results for input(s): CKTOTAL, CKMB, CKMBINDEX, TROPONINI in the last 168 hours.  Lipid Panel: Recent Labs  Lab 10/02/19 0729  CHOL 164  TRIG 117  HDL 52  CHOLHDL 3.2  VLDL 23  LDLCALC 89    Imaging: DG Shoulder Left Port  Result Date: 10/03/2019 CLINICAL DATA:  Left shoulder pain after multiple falls. EXAM: LEFT SHOULDER COMPARISON:  December 27, 2016. FINDINGS: Status post left total shoulder arthroplasty. The glenoid and humeral components appear to be well situated. No fracture or dislocation is noted. Ribs are unremarkable. IMPRESSION: Status post left total shoulder arthroplasty. Electronically Signed   By: Marijo Conception M.D.   On: 10/03/2019 15:37    Assessment: 69 year old female admitted for passive SI in the context of severe melancholic depression, with history of somatic complaints, now with new symptom of left foot drop.  1. Exam reveals weakness of foot dorsiflexion and eversion at 2/5, as well as left foot drop with external rotation and some dragging on ambulation. There is decreased fine touch sensation to lateral aspect of left calf and dorsum of left foot. Overall exam findings are most consistent with a lesion of the common peroneal nerve. The findings are also compatible with a left L5 radiculopathy. The sensory loss pattern militates against an isolated lesion of the deep peroneal nerve.   2. Mild left face, arm and leg decreased temp sensation also noted on exam. This may be referable to a possible right thalamic lacunar infarction of indeterminate age.  3. CT head (8/30): No evidence of acute territorial infarction, hemorrhage, hydrocephalus,extra-axial collection or mass  lesion/mass effect. Normal gray-white differentiation. Ventricles are normal in size and contour.  Recommendations: 1. MRI brain to assess for possible right thalamic lacunar infarction 2. MRI of lumbar spine to assess for possible left L5 radiculopathy.  3. If no nerve root compression seen on L-spine MRI, will need outpatient Neurology appointment for EMG/NCS of LLE.     Electronically signed: Dr. Kerney Elbe 10/05/2019, 2:15 PM
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
## 2019-10-05 NOTE — Progress Notes (Signed)
D: Pt alert and oriented. Pt rates depression 7/10, hopelessness 6/10, and anxiety 4/10.Pt goal: "Stop hurting, be more talkative at group." Pt reports energy level as low and concentration as being good. Pt reports sleep last night as being fair. Pt did receive medications for sleep and sometimes finds them helpful. Pt reports experiencing 2/10 shoulder pain, prn meds given . Pt denies experiencing any SI/HI, or AVH at this time.   A: Scheduled medications administered to pt, per MD orders. Support and encouragement provided. Frequent verbal contact made. Routine safety checks conducted q15 minutes.   R: No adverse drug reactions noted. Pt verbally contracts for safety at this time. Pt complaint with medications and treatment plan. Pt interacts well with others on the unit. Pt remains safe at this time. Will continue to monitor.

## 2019-10-05 NOTE — Progress Notes (Signed)
Patient calm and pleasant during assessment denying SI/HI/AVH. Patient endorses pain, 5/10 (check MAR). Patient endorses depression. Patient compliant with medication administration per MD orders. Pt given education, support, and encouragement to be active in her treatment plan. Patient observed by this Probation officer interacting appropriately with staff and peers on the unit. Patient being monitored Q 15 minutes for safety per unit protocol. Pt remains safe on the unit.

## 2019-10-05 NOTE — [facility] Group Notes (Signed)
[place] Group Notes:  (Nursing/MHT/Case Management/Adjunct)  Date:  10/05/2019  Time:  9:12 PM  Type of Therapy:  Group Therapy  Participation Level:  Active  Participation Quality:  Appropriate  Affect:  Appropriate  Cognitive:  Alert  Insight:  Good  Engagement in Group:  Engaged and wan to be call [name] and goal was to feel better and she did.  Modes of Intervention:  Support  Summary of Progress/Problems:  Chelsey [name] 10/05/2019, 9:12 PM

## 2019-10-05 NOTE — Plan of Care (Signed)
Patient endorsing depression, pt states she is going to be stating ECT next week  Problem: Education: Goal: Emotional status will improve Outcome: Not Progressing Goal: Mental status will improve Outcome: Not Progressing

## 2019-10-06 ENCOUNTER — Inpatient Hospital Stay: Payer: Medicare Other

## 2019-10-06 ENCOUNTER — Other Ambulatory Visit: Payer: Self-pay | Admitting: Psychiatry

## 2019-10-06 MED ORDER — PAROXETINE HCL 20 MG PO TABS
20.0000 mg | ORAL_TABLET | Freq: Every day | ORAL | Status: DC
  Administered 2019-10-06: 20 mg via ORAL
  Filled 2019-10-06 (×2): qty 1

## 2019-10-06 MED ORDER — BUPROPION HCL ER (XL) 150 MG PO TB24
150.0000 mg | ORAL_TABLET | Freq: Every day | ORAL | Status: DC
  Administered 2019-10-06 – 2019-10-07 (×2): 150 mg via ORAL
  Filled 2019-10-06 (×2): qty 1

## 2019-10-06 NOTE — Progress Notes (Signed)
[facility] MD Progress Note  10/06/2019 12:41 PM Chelsey [name]  MRN:  [id number] Subjective: Follow-up patient with major depression.  Continues to complain of depressed mood fatigue hopelessness negativity.  No hallucinations or psychotic symptoms.  Suicidal thoughts without intent.  No new physical complaints.  Coronavirus test negative Principal Problem: Major depressive disorder, recurrent severe without psychotic features ([place]) Diagnosis: Principal Problem:   Major depressive disorder, recurrent severe without psychotic features ([place]) Active Problems:   Gastroesophageal reflux disease without esophagitis   Chronic constipation   Chronic pain syndrome  Total Time spent with patient: 30 minutes  Past Psychiatric History: History of longstanding recurrent depression with recent suicidal behavior  Past Medical History:  Past Medical History:  Diagnosis Date  . Breast cancer ([place]) 1997   right breast, radiation  . Bronchitis    recent/ 06/09/15 had chest xray/ [facility]/resolved  . Cancer [facility]) 1997   right lumpectomy,L/Ad/R   . GERD (gastroesophageal reflux disease)   . History of hiatal hernia   . Hypercholesteremia   . Hyperlipidemia   . Low BP    TYPICALLY RUNS 80'S/60'S  . Panic attack   . Personal history of malignant neoplasm of breast     Past Surgical History:  Procedure Laterality Date  . BICEPT TENODESIS Left 11/23/2016   Procedure: BICEPS TENODESIS;  Surgeon: Leim Fabry, MD;  Location: [facility] ORS;  Service: Orthopedics;  Laterality: Left;  . BREAST EXCISIONAL BIOPSY Right 1997   pos  . BREAST SURGERY Right 1997   lumpectomy  . CHOLECYSTECTOMY N/A 08/06/2016   Procedure: LAPAROSCOPIC CHOLECYSTECTOMY WITH INTRAOPERATIVE CHOLANGIOGRAM;  Surgeon: Christene Lye, MD;  Location: [facility] ORS;  Service: General;  Laterality: N/A;  . COLONOSCOPY  2008  . COLONOSCOPY WITH PROPOFOL N/A 07/21/2015   Procedure: COLONOSCOPY WITH PROPOFOL;  Surgeon: Lucilla Lame, MD;   Location: [place];  Service: Endoscopy;  Laterality: N/A;  . COLONOSCOPY WITH PROPOFOL N/A 01/05/2018   Procedure: COLONOSCOPY WITH PROPOFOL;  Surgeon: Jonathon Bellows, MD;  Location: [facility] ENDOSCOPY;  Service: Gastroenterology;  Laterality: N/A;  . ESOPHAGOGASTRODUODENOSCOPY (EGD) WITH PROPOFOL N/A 06/16/2016   Procedure: ESOPHAGOGASTRODUODENOSCOPY (EGD) WITH PROPOFOL;  Surgeon: Christene Lye, MD;  Location: [facility] ENDOSCOPY;  Service: Endoscopy;  Laterality: N/A;  . ESOPHAGOGASTRODUODENOSCOPY (EGD) WITH PROPOFOL N/A 01/05/2018   Procedure: ESOPHAGOGASTRODUODENOSCOPY (EGD) WITH PROPOFOL;  Surgeon: Jonathon Bellows, MD;  Location: [facility] ENDOSCOPY;  Service: Gastroenterology;  Laterality: N/A;  . ESOPHAGOGASTRODUODENOSCOPY (EGD) WITH PROPOFOL N/A 06/13/2018   Procedure: ESOPHAGOGASTRODUODENOSCOPY (EGD) WITH PROPOFOL;  Surgeon: Jonathon Bellows, MD;  Location: [facility] ENDOSCOPY;  Service: Gastroenterology;  Laterality: N/A;  . ESOPHAGOGASTRODUODENOSCOPY (EGD) WITH PROPOFOL N/A 07/16/2018   Procedure: ESOPHAGOGASTRODUODENOSCOPY (EGD) WITH PROPOFOL;  Surgeon: Lucilla Lame, MD;  Location: [facility] ENDOSCOPY;  Service: Endoscopy;  Laterality: N/A;  . HARDWARE REMOVAL Left 12/27/2016   Procedure: HARDWARE REMOVAL LEFT  PROXIMAL HUMEROUS;  Surgeon: Leim Fabry, MD;  Location: [facility] ORS;  Service: Orthopedics;  Laterality: Left;  . IR GJ TUBE CHANGE  04/21/2018  . NISSEN FUNDOPLICATION N/A 8/91/6945   Procedure: NISSEN FUNDOPLICATION;  Surgeon: Jules Husbands, MD;  Location: [facility] ORS;  Service: General;  Laterality: N/A;  . ORIF HUMERUS FRACTURE Left 11/23/2016   Procedure: OPEN REDUCTION INTERNAL FIXATION (ORIF) PROXIMAL HUMERUS FRACTURE;  Surgeon: Leim Fabry, MD;  Location: [facility] ORS;  Service: Orthopedics;  Laterality: Left;  . REPAIR OF ESOPHAGUS  03/09/2018   Procedure: REPAIR OF ESOPHAGUS;  Surgeon: Jules Husbands, MD;  Location: [facility] ORS;  Service: General;;  .  REVERSE SHOULDER ARTHROPLASTY Left 12/27/2016    Procedure: REVERSE SHOULDER ARTHROPLASTY;  Surgeon: Leim Fabry, MD;  Location: [facility] ORS;  Service: Orthopedics;  Laterality: Left;  . ROBOTIC ASSISTED LAPAROSCOPIC REPAIR OF PARAESOPHAGEAL HERNIA N/A 03/09/2018   Procedure: ROBOTIC HIATAL HERNIA CONVERTED TO OPEN;  Surgeon: Jules Husbands, MD;  Location: [facility] ORS;  Service: General;  Laterality: N/A;   Family History:  Family History  Problem Relation Age of Onset  . Anxiety disorder Brother   . Obesity Brother   . COPD Brother   . Kidney disease Mother   . Heart attack Father   . Hypertension Father   . Alcohol abuse Father   . Depression Brother   . Anxiety disorder Brother   . Breast cancer Maternal Grandmother    Family Psychiatric  History: See previous Social History:  Social History   Substance and Sexual Activity  Alcohol Use No  . Alcohol/week: 0.0 standard drinks     Social History   Substance and Sexual Activity  Drug Use No    Social History   Socioeconomic History  . Marital status: Widowed    Spouse name: Not on file  . Number of children: 2  . Years of education: Not on file  . Highest education level: Bachelor's degree (e.g., BA, AB, BS)  Occupational History  . Not on file  Tobacco Use  . Smoking status: Current Some Day Smoker    Packs/day: 0.10    Years: 10.00    Pack years: 1.00    Types: Cigarettes    Start date: 11/25/2009  . Smokeless tobacco: Never Used  . Tobacco comment: 1 cigarette daily  Vaping Use  . Vaping Use: Never used  Substance and Sexual Activity  . Alcohol use: No    Alcohol/week: 0.0 standard drinks  . Drug use: No  . Sexual activity: Never  Other Topics Concern  . Not on file  Social History Narrative  . Not on file   Social Determinants of Health   Financial Resource Strain:   . Difficulty of Paying Living Expenses: Not on file  Food Insecurity:   . Worried About Charity fundraiser in the Last Year: Not on file  . Ran Out of Food in the Last Year: Not on  file  Transportation Needs:   . Lack of Transportation (Medical): Not on file  . Lack of Transportation (Non-Medical): Not on file  Physical Activity:   . Days of Exercise per Week: Not on file  . Minutes of Exercise per Session: Not on file  Stress:   . Feeling of Stress : Not on file  Social Connections:   . Frequency of Communication with Friends and Family: Not on file  . Frequency of Social Gatherings with Friends and Family: Not on file  . Attends Religious Services: Not on file  . Active Member of Clubs or Organizations: Not on file  . Attends Archivist Meetings: Not on file  . Marital Status: Not on file   Additional Social History:                         Sleep: Fair  Appetite:  Fair  Current Medications: Current Facility-Administered Medications  Medication Dose Route Frequency Provider Last Rate Last Admin  . acetaminophen (TYLENOL) tablet 650 mg  650 mg Oral Q6H PRN Patrecia Pour, NP   650 mg at 10/02/19 2131  . alum & mag hydroxide-simeth (MAALOX/MYLANTA) 200-200-20 MG/5ML  suspension 30 mL  30 mL Oral Q4H PRN Patrecia Pour, NP      . buPROPion (WELLBUTRIN XL) 24 hr tablet 150 mg  150 mg Oral Daily Chriselda Leppert T, MD      . busPIRone (BUSPAR) tablet 10 mg  10 mg Oral TID Ameri Cahoon, Madie Reno, MD   10 mg at 10/06/19 1205  . docusate sodium (COLACE) capsule 100 mg  100 mg Oral BID Zahari Fazzino, Madie Reno, MD   100 mg at 10/06/19 0827  . ibuprofen (ADVIL) tablet 600 mg  600 mg Oral Q6H PRN Kiernan Farkas, Madie Reno, MD   600 mg at 10/06/19 0829  . linaclotide (LINZESS) capsule 72 mcg  72 mcg Oral Q0600 Hampton Abbot, MD   72 mcg at 10/06/19 [phone]  . magnesium hydroxide (MILK OF MAGNESIA) suspension 30 mL  30 mL Oral Daily PRN Patrecia Pour, NP   30 mL at 10/06/19 0829  . OLANZapine (ZYPREXA) tablet 10 mg  10 mg Oral QHS Chisom Muntean, Madie Reno, MD   10 mg at 10/05/19 2121  . pantoprazole (PROTONIX) EC tablet 40 mg  40 mg Oral Daily Patrecia Pour, NP   40 mg at 10/06/19  0827  . PARoxetine (PAXIL) tablet 20 mg  20 mg Oral QAC supper Najwa Spillane T, MD      . polyethylene glycol (MIRALAX / GLYCOLAX) packet 17 g  17 g Oral Daily Patrecia Pour, NP   17 g at 10/06/19 0827  . senna-docusate (Senokot-S) tablet 2 tablet  2 tablet Oral BID PRN Kandy Towery T, MD      . traZODone (DESYREL) tablet 100 mg  100 mg Oral QHS Shanika Levings, Madie Reno, MD   100 mg at 10/05/19 2121    Lab Results:  Results for orders placed or performed during the hospital encounter of 09/25/19 (from the past 48 hour(s))  SARS Coronavirus 2 by RT PCR (hospital order, performed in [facility] hospital lab) Nasopharyngeal Nasopharyngeal Swab     Status: None   Collection Time: 10/05/19  6:59 PM   Specimen: Nasopharyngeal Swab  Result Value Ref Range   SARS Coronavirus 2 NEGATIVE NEGATIVE    Comment: (NOTE) SARS-CoV-2 target nucleic acids are NOT DETECTED.  The SARS-CoV-2 RNA is generally detectable in upper and lower respiratory specimens during the acute phase of infection. The lowest concentration of SARS-CoV-2 viral copies this assay can detect is 250 copies / mL. A negative result does not preclude SARS-CoV-2 infection and should not be used as the sole basis for treatment or other patient management decisions.  A negative result may occur with improper specimen collection / handling, submission of specimen other than nasopharyngeal swab, presence of viral mutation(s) within the areas targeted by this assay, and inadequate number of viral copies (<250 copies / mL). A negative result must be combined with clinical observations, patient history, and epidemiological information.  Fact Sheet for Patients:   StrictlyIdeas.no  Fact Sheet for Healthcare Providers: BankingDealers.co.za  This test is not yet approved or  cleared by the [place] FDA and has been authorized for detection and/or diagnosis of SARS-CoV-2 by FDA under an Emergency  Use Authorization (EUA).  This EUA will remain in effect (meaning this test can be used) for the duration of the COVID-19 declaration under Section 564(b)(1) of the Act, 21 U.S.C. section 360bbb-3(b)(1), unless the authorization is terminated or revoked sooner.  Performed at [facility], [address], [place], [place] [address]  Blood Alcohol level:  Lab Results  Component Value Date   ETH <10 09/24/2019   ETH <10 40/81/4481    Metabolic Disorder Labs: No results found for: HGBA1C, MPG No results found for: PROLACTIN Lab Results  Component Value Date   CHOL 164 10/02/2019   TRIG 117 10/02/2019   HDL 52 10/02/2019   CHOLHDL 3.2 10/02/2019   VLDL 23 10/02/2019   LDLCALC 89 10/02/2019   LDLCALC 87 07/27/2017    Physical Findings: AIMS:  , ,  ,  ,    CIWA:    COWS:     Musculoskeletal: Strength & Muscle Tone: within normal limits Gait & Station: normal Patient leans: N/A  Psychiatric Specialty Exam: Physical Exam Vitals and nursing note reviewed.  Constitutional:      Appearance: She is well-developed.  HENT:     Head: Normocephalic and atraumatic.  Eyes:     Conjunctiva/sclera: Conjunctivae normal.     Pupils: Pupils are equal, round, and reactive to light.  Cardiovascular:     Heart sounds: Normal heart sounds.  Pulmonary:     Effort: Pulmonary effort is normal.  Abdominal:     Palpations: Abdomen is soft.  Musculoskeletal:        General: Normal range of motion.     Cervical back: Normal range of motion.  Skin:    General: Skin is warm and dry.  Neurological:     General: No focal deficit present.     Mental Status: She is alert.  Psychiatric:        Attention and Perception: She is inattentive.        Mood and Affect: Mood is depressed.        Speech: Speech is delayed.        Behavior: Behavior is slowed.        Thought Content: Thought content includes suicidal ideation. Thought content does not include suicidal plan.         Cognition and Memory: Cognition is impaired.        Judgment: Judgment normal.     Review of Systems  Constitutional: Positive for fatigue.  HENT: Negative.   Eyes: Negative.   Respiratory: Negative.   Cardiovascular: Negative.   Gastrointestinal: Negative.   Musculoskeletal: Negative.   Skin: Negative.   Neurological: Negative.   Psychiatric/Behavioral: Positive for decreased concentration, dysphoric mood and suicidal ideas.    Blood pressure 110/70, pulse 74, temperature 97.7 F (36.5 C), temperature source Oral, resp. rate 18, height 5\' 5"  (1.651 m), weight 54.4 kg, SpO2 100 %.Body mass index is 19.96 kg/m.  General Appearance: Disheveled  Eye Contact:  Minimal  Speech:  Slow  Volume:  Decreased  Mood:  Depressed  Affect:  Congruent  Thought Process:  Coherent  Orientation:  Full (Time, Place, and Person)  Thought Content:  Logical and Rumination  Suicidal Thoughts:  Yes.  without intent/plan  Homicidal Thoughts:  No  Memory:  Immediate;   Fair Recent;   Fair Remote;   Fair  Judgement:  Impaired  Insight:  Shallow  Psychomotor Activity:  Decreased  Concentration:  Concentration: Fair  Recall:  [employer] of Knowledge:  Fair  Language:  Fair  Akathisia:  No  Handed:  Right  AIMS (if indicated):     Assets:  Desire for Improvement Housing  ADL's:  Impaired  Cognition:  Impaired,  Mild  Sleep:  Number of Hours: 6     Treatment Plan Summary: Daily contact with patient to  assess and evaluate symptoms and progress in treatment, Medication management and Plan We reviewed her antidepressant medicine.  We will taper back on some of the Paxil down to just 20 mg while starting bupropion 150 mg a day with possible cross titration.  Tentative schedule for ECT on Monday.  Supportive and educational counseling and review of plan with patient  Alethia Berthold, MD 10/06/2019, 12:41 PM

## 2019-10-06 NOTE — Progress Notes (Signed)
Adult Psychoeducational Group Note  Date:  10/06/2019 Time:  10:04 PM  Group Topic/Focus:  Wrap-Up Group:   The focus of this group is to help patients review their daily goal of treatment and discuss progress on daily workbooks.  Participation Level:  Active  Participation Quality:  Appropriate  Affect:  Appropriate  Cognitive:  Appropriate  Insight: Appropriate  Engagement in Group:  Engaged  Modes of Intervention:  Discussion  Additional Comments:  Patient attended group and said that her day was a 6. Her coping skills were watching tv, reading and attending group.   Aika Brzoska W Yexalen Deike 5/33/9179, 10:04 PM

## 2019-10-06 NOTE — Tx Team (Signed)
Interdisciplinary Treatment and Diagnostic Plan Update  10/06/2019 Time of Session: 10:00 AM   Chelsey [name] MRN: [id number]  Principal Diagnosis: Major depressive disorder, recurrent severe without psychotic features ([place])  Secondary Diagnoses: Principal Problem:   Major depressive disorder, recurrent severe without psychotic features ([place]) Active Problems:   Gastroesophageal reflux disease without esophagitis   Chronic constipation   Chronic pain syndrome   Current Medications:  Current Facility-Administered Medications  Medication Dose Route Frequency Provider Last Rate Last Admin  . acetaminophen (TYLENOL) tablet 650 mg  650 mg Oral Q6H PRN Patrecia Pour, NP   650 mg at 10/02/19 2131  . alum & mag hydroxide-simeth (MAALOX/MYLANTA) 200-200-20 MG/5ML suspension 30 mL  30 mL Oral Q4H PRN Patrecia Pour, NP      . buPROPion (WELLBUTRIN XL) 24 hr tablet 150 mg  150 mg Oral Daily Clapacs, Madie Reno, MD   150 mg at 10/06/19 1301  . busPIRone (BUSPAR) tablet 10 mg  10 mg Oral TID Clapacs, Madie Reno, MD   10 mg at 10/06/19 1205  . docusate sodium (COLACE) capsule 100 mg  100 mg Oral BID Clapacs, Madie Reno, MD   100 mg at 10/06/19 0827  . ibuprofen (ADVIL) tablet 600 mg  600 mg Oral Q6H PRN Clapacs, Madie Reno, MD   600 mg at 10/06/19 0829  . linaclotide (LINZESS) capsule 72 mcg  72 mcg Oral Q0600 Hampton Abbot, MD   72 mcg at 10/06/19 [phone]  . magnesium hydroxide (MILK OF MAGNESIA) suspension 30 mL  30 mL Oral Daily PRN Patrecia Pour, NP   30 mL at 10/06/19 0829  . OLANZapine (ZYPREXA) tablet 10 mg  10 mg Oral QHS Clapacs, Madie Reno, MD   10 mg at 10/05/19 2121  . pantoprazole (PROTONIX) EC tablet 40 mg  40 mg Oral Daily Patrecia Pour, NP   40 mg at 10/06/19 0827  . PARoxetine (PAXIL) tablet 20 mg  20 mg Oral QAC supper Clapacs, John T, MD      . polyethylene glycol (MIRALAX / GLYCOLAX) packet 17 g  17 g Oral Daily Patrecia Pour, NP   17 g at 10/06/19 0827  . senna-docusate (Senokot-S) tablet 2  tablet  2 tablet Oral BID PRN Clapacs, John T, MD      . traZODone (DESYREL) tablet 100 mg  100 mg Oral QHS Clapacs, Madie Reno, MD   100 mg at 10/05/19 2121   PTA Medications: Medications Prior to Admission  Medication Sig Dispense Refill Last Dose  . bisacodyl (DULCOLAX) 5 MG EC tablet Take 1 tablet (5 mg total) by mouth daily. (Patient not taking: Reported on 09/24/2019) 30 tablet 0   . busPIRone (BUSPAR) 10 MG tablet Take 1 tablet (10 mg total) by mouth 2 (two) times daily. (Patient not taking: Reported on 09/24/2019) 180 tablet 1   . linaclotide (LINZESS) 72 MCG capsule Take 1 capsule (72 mcg total) by mouth daily before breakfast. 30 capsule 2   . omeprazole (PRILOSEC) 20 MG capsule Take 1 capsule (20 mg total) by mouth 2 (two) times daily before a meal. 180 capsule 3   . PARoxetine (PAXIL) 20 MG tablet Take 1 tablet (20 mg total) by mouth daily before supper. 90 tablet 1   . polyethylene glycol (MIRALAX / GLYCOLAX) 17 g packet Take 17 g by mouth daily. 14 each 0   . Simethicone (GAS-X PO) Take 1-2 tablets by mouth daily as needed (gas).     . simvastatin (ZOCOR)  20 MG tablet TAKE ONE TABLET BY MOUTH AT BEDTIME (Patient not taking: Reported on 07/12/2019) 90 tablet 1   . traZODone (DESYREL) 100 MG tablet Take 1 tablet (100 mg total) by mouth at bedtime. 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   Physician Treatment Plan for Primary Diagnosis: Major depressive disorder, recurrent severe without psychotic features ([place]) Long Term Goal(s):     Short Term Goals:    Medication Management: Evaluate patient's response, side effects, and tolerance of medication regimen.  Therapeutic Interventions: 1 to 1 sessions,  Unit Group sessions and Medication administration.  Evaluation of Outcomes: Not Progressing  Physician Treatment Plan for Secondary Diagnosis: Principal Problem:   Major depressive disorder, recurrent severe without psychotic features ([place]) Active Problems:   Gastroesophageal reflux disease without esophagitis   Chronic constipation   Chronic pain syndrome  Long Term Goal(s):     Short Term Goals:       Medication Management: Evaluate patient's response, side effects, and tolerance of medication regimen.  Therapeutic Interventions: 1 to 1 sessions, Unit Group sessions and Medication administration.  Evaluation of Outcomes: Not Progressing   RN Treatment Plan for Primary Diagnosis: Major depressive disorder, recurrent severe without psychotic features ([place]) Long Term Goal(s): Knowledge of disease and therapeutic regimen to maintain health will improve  Short Term Goals: Ability to demonstrate self-control, Ability to participate in decision making will improve, Ability to verbalize feelings will improve, Ability to disclose and discuss suicidal ideas, Ability to identify and develop effective coping behaviors will improve and Compliance with prescribed medications will improve  Medication Management: RN will administer medications as ordered by provider, will assess and evaluate patient's response and provide education to patient for prescribed medication. RN will report any adverse and/or side effects to prescribing provider.  Therapeutic Interventions: 1 on 1 counseling sessions, Psychoeducation, Medication administration, Evaluate responses to treatment, Monitor vital signs and CBGs as ordered, Perform/monitor CIWA, COWS, AIMS and Fall Risk screenings as ordered, Perform wound care treatments as ordered.  Evaluation of Outcomes: Not Progressing   LCSW Treatment Plan for Primary Diagnosis: Major depressive disorder, recurrent severe without psychotic features ([place]) Long Term  Goal(s): Safe transition to appropriate next level of care at discharge, Engage patient in therapeutic group addressing interpersonal concerns.  Short Term Goals: Engage patient in aftercare planning with referrals and resources, Increase social support, Increase ability to appropriately verbalize feelings, Increase emotional regulation, Facilitate acceptance of mental health diagnosis and concerns, Identify triggers associated with mental health/substance abuse issues and Increase skills for wellness and recovery  Therapeutic Interventions: Assess for all discharge needs, 1 to 1 time with Social worker, Explore available resources and support systems, Assess for adequacy in community support network, Educate family and significant other(s) on suicide prevention, Complete Psychosocial Assessment, Interpersonal group therapy.  Evaluation of Outcomes: Not Progressing   Progress in Treatment: Attending groups: Yes. Participating in groups: Yes. Taking medication as prescribed: Yes. Toleration medication: Yes. Family/Significant other contact made: Yes, individual(s) contacted:  SPE completed with patients son  Patient understands diagnosis: Yes. Discussing patient identified problems/goals with staff: Yes. Medical problems stabilized or resolved: Yes. Denies suicidal/homicidal ideation: Yes. Issues/concerns per patient self-inventory: No. Other: N/a  New problem(s) identified: No, Describe:  None   New Short Term/Long Term Goal(s): medication management  for mood stabilization; elimination of SI thoughts; development of comprehensive mental wellness/sobriety plan.  Updates 10/02/2019:  No changes at this time. Update 10/06/19 No changes at this time.   Patient Goals:  "get my medication right, stop hurting all day and get some energy" Updates 10/02/2019:  No changes at this time. Update 10/06/19: Patient stated that she would like to know how to deal with living with a son who is an alcoholic and is  not ready to receive help. Patient stated she was interested in long-term assisted living    Discharge Plan or Barriers: Patient reports plans to return to her home. She has been hesitant at this time to agree to aftercare plans. Updates 10/02/2019:  No changes at this time. Update 10/06/19- Patient is interested in long-term assisted living.   Reason for Continuation of Hospitalization: Anxiety Depression Medication stabilization Suicidal ideation  Estimated Length of Stay: 1-7 days   Attendees: Patient: 10/06/2019 4:33 PM  Physician: Dr. Weber Cooks, MD 10/06/2019 4:33 PM  Nursing:  10/06/2019 4:33 PM  RN Care Manager: 10/06/2019 4:33 PM  Social Worker: Raina Mina, [place]  10/06/2019 4:33 PM  Recreational Therapist:  10/06/2019 4:33 PM  Other:  10/06/2019 4:33 PM  Other:  10/06/2019 4:33 PM  Other: 10/06/2019 4:33 PM    Scribe for Treatment Team: Raina Mina, [place] 10/06/2019 4:33 PM

## 2019-10-06 NOTE — Progress Notes (Signed)
D: Pt alert and oriented. Pt rates depression 6/10, hopelessness 6/10, and anxiety 5/10.Pt goal: "Having more energy and courage to face each day." Pt reports energy level as low and concentration as being good. Pt reports sleep last night as being fair. Pt did receive medications for sleep and did find them helpful. Pt reports experiencing 4/10 Left shoulder pain, prn meds given. Pt denies experiencing any SI/HI, or AVH at this time.   A: Scheduled medications administered to pt, per MD orders. Support and encouragement provided. Frequent verbal contact made. Routine safety checks conducted q15 minutes.   R: No adverse drug reactions noted. Pt verbally contracts for safety at this time. Pt complaint with medications and treatment plan. Pt interacts well with others on the unit. Pt remains safe at this time. Will continue to monitor.  Pt is scheduled to begin ECT Monday. Pt had an MRI preformed today.

## 2019-10-06 NOTE — [facility] Group Notes (Signed)
LCSW Group Therapy 10/06/19: 1:50 PM- 2:30 PM    Type of Therapy and Topic:  Group Therapy:  Setting Goals   Participation Level:  Minimal    Description of Group: In this process group, patients discussed using strengths to work toward goals and address challenges.  Patients identified two positive things about themselves and one goal they were working on.  Patients were given the opportunity to share openly and support each other's plan for self-empowerment.  The group discussed the value of gratitude and were encouraged to have a daily reflection of positive characteristics or circumstances.  Patients were encouraged to identify a plan to utilize their strengths to work on current challenges and goals.   Therapeutic Goals 1. Patient will verbalize personal strengths/positive qualities and relate how these can assist with achieving desired personal goals 2. Patients will verbalize affirmation of peers plans for personal change and goal setting 3. Patients will explore the value of gratitude and positive focus as related to successful achievement of goals 4. Patients will verbalize a plan for regular reinforcement of personal positive qualities and circumstances.   Summary of Patient Progress: Patient stated that she had a MRI today and that she feels tired from that. Patient stated that one of her goals is to learn how to live with her alcoholic son. Patient stated that her son feels like he has no problem and has no other place to live. Patient stated that she is interested in long-term assisted living. Patient is also open to counseling and therapy upon discharge.       Therapeutic Modalities Cognitive Behavioral Therapy Motivational Interviewing

## 2019-10-07 MED ORDER — BUPROPION HCL ER (XL) 150 MG PO TB24
300.0000 mg | ORAL_TABLET | Freq: Every day | ORAL | Status: DC
  Administered 2019-10-08 – 2019-10-15 (×8): 300 mg via ORAL
  Filled 2019-10-07 (×8): qty 2

## 2019-10-07 MED ORDER — OLANZAPINE 5 MG PO TABS
5.0000 mg | ORAL_TABLET | Freq: Every day | ORAL | Status: DC
  Administered 2019-10-07 – 2019-10-14 (×8): 5 mg via ORAL
  Filled 2019-10-07 (×8): qty 1

## 2019-10-07 NOTE — Plan of Care (Signed)
  Problem: Education: Goal: Knowledge of [place] [facility] Education information/materials will improve Outcome: Progressing Goal: Emotional status will improve Outcome: Progressing Goal: Mental status will improve Outcome: Progressing Goal: Verbalization of understanding the information provided will improve Outcome: Progressing   Problem: Safety: Goal: Periods of time without injury will increase Outcome: Progressing

## 2019-10-07 NOTE — Plan of Care (Signed)
Pt rates depression 6/10 and anxiety 5/10. Pt denies SI, HI and AVH. Pt was educated on care plan and verbalizes understanding. Pt was encouraged to attend groups. Collier Bullock RN Problem: Education: Goal: Freight forwarder Education information/materials will improve Outcome: Progressing Goal: Emotional status will improve Outcome: Progressing Goal: Mental status will improve Outcome: Progressing Goal: Verbalization of understanding the information provided will improve Outcome: Progressing   Problem: Safety: Goal: Periods of time without injury will increase Outcome: Progressing   Problem: Education: Goal: Ability to make informed decisions regarding treatment will improve Outcome: Progressing   Problem: Self-Concept: Goal: Ability to disclose and discuss suicidal ideas will improve Outcome: Progressing Goal: Will verbalize positive feelings about self Outcome: Progressing   Problem: Education: Goal: Utilization of techniques to improve thought processes will improve Outcome: Progressing Goal: Knowledge of the prescribed therapeutic regimen will improve Outcome: Progressing   Problem: Activity: Goal: Interest or engagement in leisure activities will improve Outcome: Progressing Goal: Imbalance in normal sleep/wake cycle will improve Outcome: Progressing   Problem: Coping: Goal: Coping ability will improve Outcome: Progressing Goal: Will verbalize feelings Outcome: Progressing   Problem: Health Behavior/Discharge Planning: Goal: Ability to make decisions will improve Outcome: Progressing Goal: Compliance with therapeutic regimen will improve Outcome: Progressing   Problem: Role Relationship: Goal: Will demonstrate positive changes in social behaviors and relationships Outcome: Progressing   Problem: Safety: Goal: Ability to disclose and discuss suicidal ideas will improve Outcome: Progressing Goal: Ability to identify and utilize support systems  that promote safety will improve Outcome: Progressing   Problem: Self-Concept: Goal: Will verbalize positive feelings about self Outcome: Progressing Goal: Level of anxiety will decrease Outcome: Progressing

## 2019-10-07 NOTE — Progress Notes (Signed)
Patient's mood is depressed. Affect is sad. Spending time out in the day room but keeps to herself, not interacting very much, if any with her peers. Denies SI, HI and AVH. Contracting for safety

## 2019-10-07 NOTE — [facility] Group Notes (Signed)
Date/Time: 10/07/2019 1:06 PM- 1:45 PM    Type of Therapy and Topic: Group Therapy: Trust and Honesty    Participation Level: Active    Description of Group:  In this group patients will be asked to explore value of being honest. Patients will be guided to discuss their thoughts, feelings, and behaviors related to honesty and trusting in others. Patients will process together how trust and honesty relate to how we form relationships with peers, family members, and self. Each patient will be challenged to identify and express feelings of being vulnerable. Patients will discuss reasons why people are dishonest and identify alternative outcomes if one was truthful (to self or others). This group will be process-oriented, with patients participating in exploration of their own experiences as well as giving and receiving support and challenge from other group members.    Therapeutic Goals:  1. Patient will identify why honesty is important to relationships and how honesty overall affects relationships.  2. Patient will identify a situation where they lied or were lied too and the feelings, thought process, and behaviors surrounding the situation  3. Patient will identify the meaning of being vulnerable, how that feels, and how that correlates to being honest with self and others.  4. Patient will identify situations where they could have told the truth, but instead lied and explain reasons of dishonesty.      Summary of Patient Progress: Patient checked into group feeling tired. Patient stated that she feels being honest is a sense of comfort. Patient stated if people are honest with her then she will be able to trust them. Patient stated that her son lies to her all the time and he makes her feel like she is crazy. Patient stated that she will lie to people about her feelings. CSW asked patient why she cannot tell people how she really feels. Patient stated she feels like people will pretend like they  care but deep down they really don't. Patient is considering being honest with her son when she returns home.     Therapeutic Modalities:  Cognitive Behavioral Therapy  Solution Focused Therapy  Motivational Interviewing  Brief Therapy     Raina Mina, Latanya Presser   10/07/2019

## 2019-10-07 NOTE — Progress Notes (Signed)
[facility] MD Progress Note  10/07/2019 12:51 PM Chelsey [name]  MRN:  [id number] Subjective: Follow-up for this 69 year old woman with depression still complaining of fatigue no different than yesterday.  Suicidal ideation without specific plan.  No psychosis.  Still having foot drop on the left side. Principal Problem: Major depressive disorder, recurrent severe without psychotic features ([place]) Diagnosis: Principal Problem:   Major depressive disorder, recurrent severe without psychotic features ([place]) Active Problems:   Gastroesophageal reflux disease without esophagitis   Chronic constipation   Chronic pain syndrome  Total Time spent with patient: 30 minutes  Past Psychiatric History: History of recurrent depression  Past Medical History:  Past Medical History:  Diagnosis Date  . Breast cancer ([place]) 1997   right breast, radiation  . Bronchitis    recent/ 06/09/15 [facility]/ [facility]/resolved  . Cancer [facility]) 1997   right lumpectomy,L/Ad/R   . GERD (gastroesophageal reflux disease)   . History of hiatal hernia   . Hypercholesteremia   . Hyperlipidemia   . Low BP    TYPICALLY RUNS 80'S/60'S  . Panic attack   . Personal history of malignant neoplasm of breast     Past Surgical History:  Procedure Laterality Date  . BICEPT TENODESIS Left 11/23/2016   Procedure: BICEPS TENODESIS;  Surgeon: Leim Fabry, MD;  Location: [facility] ORS;  Service: Orthopedics;  Laterality: Left;  . BREAST EXCISIONAL BIOPSY Right 1997   pos  . BREAST SURGERY Right 1997   lumpectomy  . CHOLECYSTECTOMY N/A 08/06/2016   Procedure: LAPAROSCOPIC CHOLECYSTECTOMY WITH INTRAOPERATIVE CHOLANGIOGRAM;  Surgeon: Christene Lye, MD;  Location: [facility];  Service: General;  Laterality: N/A;  . COLONOSCOPY  2008  . COLONOSCOPY WITH PROPOFOL N/A 07/21/2015   Procedure: COLONOSCOPY WITH PROPOFOL;  Surgeon: Lucilla Lame, MD;  Location: [place];  Service: Endoscopy;  Laterality: N/A;  .  COLONOSCOPY WITH PROPOFOL N/A 01/05/2018   Procedure: COLONOSCOPY WITH PROPOFOL;  Surgeon: Jonathon Bellows, MD;  Location: [facility] END[facility]COPY;  Service: Gastroenterology;  Laterality: N/A;  . ESOPHAGOGASTRODUODENOSCOPY (EGD) WITH PROPOFOL N/A 06/16/2016   Procedure: ESOPHAGOGASTRODUODENOSCOPY (EGD) WITH PROPOFOL;  Surgeon: Christene Lye, MD;  Location: [facility] ENDOSCOPY;  Service: Endoscopy;  Laterality: N/A;  . ESOPHAGOGASTRODUODENOSCOPY (EGD) WITH PROPOFOL N/A 01/05/2018   Procedure: ESOPHAGOGASTRODUODENOSCOPY (EGD) WITH PROPOFOL;  Surgeon: Jonathon Bellows, MD;  Location: [facility] ENDOSCOPY;  Service: Gastroenterology;  Laterality: N/A;  . ESOPHAGOGASTRODUODENOSCOPY (EGD) WITH PROPOFOL N/A 06/13/2018   Procedure: ESOPHAGOGASTRODUODENOSCOPY (EGD) WITH PROPOFOL;  Surgeon: Jonathon Bellows, MD;  Location: [facility] ENDOSCOPY;  Service: Gastroenterology;  Laterality: N/A;  . ESOPHAGOGASTRODUODENOSCOPY (EGD) WITH PROPOFOL N/A 07/16/2018   Procedure: ESOPHAGOGASTRODUODENOSCOPY (EGD) WITH PROPOFOL;  Surgeon: Lucilla Lame, MD;  Location: [facility] ENDOSCOPY;  Service: Endoscopy;  Laterality: N/A;  . HARDWARE REMOVAL Left 12/27/2016   Procedure: HARDWARE REMOVAL LEFT  PROXIMAL HUMEROUS;  Surgeon: Leim Fabry, MD;  Location: [facility];  Service: Orthopedics;  Laterality: Left;  . IR GJ TUBE CHANGE  04/21/2018  . NISSEN FUNDOPLICATION N/A 8/82/8003   Procedure: NISSEN FUNDOPLICATION;  Surgeon: Jules Husbands, MD;  Location: [facility];  Service: General;  Laterality: N/A;  . ORIF HUMERUS FRACTURE Left 11/23/2016   Procedure: OPEN REDUCTION INTERNAL FIXATION (ORIF) PROXIMAL HUMERUS FRACTURE;  Surgeon: Leim Fabry, MD;  Location: [facility];  Service: Orthopedics;  Laterality: Left;  . REPAIR OF ESOPHAGUS  03/09/2018   Procedure: REPAIR OF ESOPHAGUS;  Surgeon: Jules Husbands, MD;  Location: [facility];  Service: General;;  . REVERSE SHOULDER ARTHROPLASTY Left 12/27/2016  Procedure: REVERSE SHOULDER ARTHROPLASTY;  Surgeon: Leim Fabry, MD;   Location: [facility];  Service: Orthopedics;  Laterality: Left;  . ROBOTIC ASSISTED LAPAROSCOPIC REPAIR OF PARAESOPHAGEAL HERNIA N/A 03/09/2018   Procedure: ROBOTIC HIATAL HERNIA CONVERTED TO OPEN;  Surgeon: Jules Husbands, MD;  Location: [facility];  Service: General;  Laterality: N/A;   Family History:  Family History  Problem Relation Age of Onset  . Anxiety disorder Brother   . Obesity Brother   . COPD Brother   . Kidney disease Mother   . Heart attack Father   . Hypertension Father   . Alcohol abuse Father   . Depression Brother   . Anxiety disorder Brother   . Breast cancer Maternal Grandmother    Family Psychiatric  History: See previous Social History:  Social History   Substance and Sexual Activity  Alcohol Use No  . Alcohol/week: 0.0 standard drinks     Social History   Substance and Sexual Activity  Drug Use No    Social History   Socioeconomic History  . Marital status: Widowed    Spouse name: Not on file  . Number of children: 2  . Years of education: Not on file  . Highest education level: Bachelor's degree (e.g., BA, AB, BS)  Occupational History  . Not on file  Tobacco Use  . Smoking status: Current Some Day Smoker    Packs/day: 0.10    Years: 10.00    Pack years: 1.00    Types: Cigarettes    Start date: 11/25/2009  . Smokeless tobacco: Never Used  . Tobacco comment: 1 cigarette daily  Vaping Use  . Vaping Use: Never used  Substance and Sexual Activity  . Alcohol use: No    Alcohol/week: 0.0 standard drinks  . Drug use: No  . Sexual activity: Never  Other Topics Concern  . Not on file  Social History Narrative  . Not on file   Social Determinants of Health   Financial Resource Strain:   . Difficulty of Paying Living Expenses: Not on file  Food Insecurity:   . Worried About Charity fundraiser in the Last Year: Not on file  . Ran Out of Food in the Last Year: Not on file  Transportation Needs:   . Lack of Transportation (Medical): Not on  file  . Lack of Transportation (Non-Medical): Not on file  Physical Activity:   . Days of Exercise per Week: Not on file  . Minutes of Exercise per Session: Not on file  Stress:   . Feeling of Stress : Not on file  Social Connections:   . Frequency of Communication with Friends and Family: Not on file  . Frequency of Social Gatherings with Friends and Family: Not on file  . Attends Religious Services: Not on file  . Active Member of Clubs or Organizations: Not on file  . Attends Archivist Meetings: Not on file  . Marital Status: Not on file   Additional Social History:                         Sleep: Fair  Appetite:  Fair  Current Medications: Current Facility-Administered Medications  Medication Dose Route Frequency Provider Last Rate Last Admin  . acetaminophen (TYLENOL) tablet 650 mg  650 mg Oral Q6H PRN Patrecia Pour, NP   650 mg at 10/02/19 2131  . alum & mag hydroxide-simeth (MAALOX/MYLANTA) 200-200-20 MG/5ML suspension 30 mL  30 mL Oral  Q4H PRN Patrecia Pour, NP      . Derrill Memo ON 10/08/2019] buPROPion (WELLBUTRIN XL) 24 hr tablet 300 mg  300 mg Oral Daily Rayhan Groleau T, MD      . busPIRone (BUSPAR) tablet 10 mg  10 mg Oral TID Aryanah Enslow, Madie Reno, MD   10 mg at 10/07/19 1142  . docusate sodium (COLACE) capsule 100 mg  100 mg Oral BID Saul Fabiano, Madie Reno, MD   100 mg at 10/07/19 0816  . ibuprofen (ADVIL) tablet 600 mg  600 mg Oral Q6H PRN Santoria Chason, Madie Reno, MD   600 mg at 10/07/19 1142  . linaclotide (LINZESS) capsule 72 mcg  72 mcg Oral Q0600 Hampton Abbot, MD   72 mcg at 10/07/19 0531  . magnesium hydroxide (MILK OF MAGNESIA) suspension 30 mL  30 mL Oral Daily PRN Patrecia Pour, NP   30 mL at 10/06/19 0829  . OLANZapine (ZYPREXA) tablet 5 mg  5 mg Oral QHS Hannia Matchett T, MD      . pantoprazole (PROTONIX) EC tablet 40 mg  40 mg Oral Daily Patrecia Pour, NP   40 mg at 10/07/19 0817  . polyethylene glycol (MIRALAX / GLYCOLAX) packet 17 g  17 g Oral Daily  Patrecia Pour, NP   17 g at 10/07/19 0817  . senna-docusate (Senokot-S) tablet 2 tablet  2 tablet Oral BID PRN Nicky Milhouse T, MD      . traZODone (DESYREL) tablet 100 mg  100 mg Oral QHS Aidyn Sportsman, Madie Reno, MD   100 mg at 10/06/19 2117    Lab Results:  Results for orders placed or performed during the hospital encounter of 09/25/19 (from the past 48 hour(s))  SARS Coronavirus 2 by RT PCR (hospital order, performed in [facility] hospital lab) Nasopharyngeal Nasopharyngeal Swab     Status: None   Collection Time: 10/05/19  6:59 PM   Specimen: Nasopharyngeal Swab  Result Value Ref Range   SARS Coronavirus 2 NEGATIVE NEGATIVE    Comment: (NOTE) SARS-CoV-2 target nucleic acids are NOT DETECTED.  The SARS-CoV-2 RNA is generally detectable in upper and lower respiratory specimens during the acute phase of infection. The lowest concentration of SARS-CoV-2 viral copies this assay can detect is 250 copies / mL. A negative result does not preclude SARS-CoV-2 infection and should not be used as the sole basis for treatment or other patient management decisions.  A negative result may occur with improper specimen collection / handling, submission of specimen other than nasopharyngeal swab, presence of viral mutation(s) within the areas targeted by this assay, and inadequate number of viral copies (<250 copies / mL). A negative result must be combined with clinical observations, patient history, and epidemiological information.  Fact Sheet for Patients:   StrictlyIdeas.no  Fact Sheet for Healthcare Providers: BankingDealers.co.za  This test is not yet approved or  cleared by the [place] FDA and has been authorized for detection and/or diagnosis of SARS-CoV-2 by FDA under an Emergency Use Authorization (EUA).  This EUA will remain in effect (meaning this test can be used) for the duration of the COVID-19 declaration under Section 564(b)(1)  of the Act, 21 U.S.C. section 360bbb-3(b)(1), unless the authorization is terminated or revoked sooner.  Performed at [facility], [address], [place], [place] [address]     Blood Alcohol level:  Lab Results  Component Value Date   [facility] <10 09/24/2019   ETH <10 19/37/9024    Metabolic Disorder Labs: No results  found for: HGBA1C, MPG No results found for: PROLACTIN Lab Results  Component Value Date   CHOL 164 10/02/2019   TRIG 117 10/02/2019   HDL 52 10/02/2019   CHOLHDL 3.2 10/02/2019   VLDL 23 10/02/2019   LDLCALC 89 10/02/2019   LDLCALC 87 07/27/2017    Physical Findings: AIMS:  , ,  ,  ,    CIWA:    COWS:     Musculoskeletal: Strength & Muscle Tone: within normal limits Gait & Station: normal Patient leans: N/A  Psychiatric Specialty Exam: Physical Exam Constitutional:      Appearance: She is well-developed.  HENT:     Head: Normocephalic and atraumatic.  Eyes:     Conjunctiva/sclera: Conjunctivae normal.     Pupils: Pupils are equal, round, and reactive to light.  Cardiovascular:     Heart sounds: Normal heart sounds.  Pulmonary:     Effort: Pulmonary effort is normal.  Abdominal:     Palpations: Abdomen is soft.  Musculoskeletal:        General: Normal range of motion.     Cervical back: Normal range of motion.  Skin:    General: Skin is warm and dry.  Neurological:     Mental Status: She is alert.     Motor: Weakness present.     Comments: Left foot drop as documented previously and noted by neurology  Psychiatric:        Attention and Perception: Attention normal.        Mood and Affect: Mood is depressed.        Speech: Speech is delayed.        Behavior: Behavior is slowed.        Thought Content: Thought content includes suicidal ideation. Thought content does not include suicidal plan.        Cognition and Memory: Cognition is impaired.        Judgment: Judgment normal.     Review of Systems  Constitutional: Negative.    HENT: Negative.   Eyes: Negative.   Respiratory: Negative.   Cardiovascular: Negative.   Gastrointestinal: Negative.   Musculoskeletal: Negative.   Skin: Negative.   Neurological: Negative.   Psychiatric/Behavioral: Positive for dysphoric mood.    Blood pressure 112/79, pulse 75, temperature 97.8 F (36.6 C), temperature source Oral, resp. rate 17, height 5\' 5"  (1.651 m), weight 54.4 kg, SpO2 98 %.Body mass index is 19.96 kg/m.  General Appearance: Casual  Eye Contact:  Good  Speech:  Clear and Coherent  Volume:  Normal  Mood:  Depressed  Affect:  Depressed  Thought Process:  Coherent  Orientation:  Full (Time, Place, and Person)  Thought Content:  Logical  Suicidal Thoughts:  Yes.  without intent/plan  Homicidal Thoughts:  No  Memory:  Immediate;   Fair Recent;   Fair Remote;   Poor  Judgement:  Impaired  Insight:  Shallow  Psychomotor Activity:  Decreased  Concentration:  Concentration: Poor  Recall:  [employer] of Knowledge:  Fair  Language:  Fair  Akathisia:  No  Handed:  Right  AIMS (if indicated):     Assets:  Desire for Improvement  ADL's:  Impaired  Cognition:  Impaired,  Mild  Sleep:  Number of Hours: 6.75     Treatment Plan Summary: Daily contact with patient to assess and evaluate symptoms and progress in treatment, Medication management and Plan Increase Wellbutrin to 300 mg.  Stop Paxil entirely.  Cut Zyprexa back down to 5 mg  all in an attempt to make her less sedated and less constipated.  ECT scheduled to begin tomorrow.  Alethia Berthold, MD 10/07/2019, 12:51 PM

## 2019-10-07 NOTE — Progress Notes (Signed)
MRI brain and L-spine completed.   MRI brain: Normal MRI appearance of the brain for age. No acute or focal lesion to explain the patient's symptoms.  MRI L-spine: 1. Mild lateral recess encroachment bilaterally at L2-3 and L3-4. 2. Early spinal and lateral recess stenosis at L4-5.  A/R: 69 year old female with left foot drop.  - The imaging studies do not reveal a stroke or nerve root compression to explain the patient's left foot drop.  - Exam revealed weakness of foot dorsiflexion and eversion at 2/5, as well as left foot drop with external rotation and some dragging on ambulation. There was decreased fine touch sensation to lateral aspect of left calf and dorsum of left foot. Overall exam findings were most consistent with a lesion of the common peroneal nerve. Left L5 radiculopathy has been ruled out with MRI. The sensory loss pattern militates against an isolated lesion of the deep peroneal nerve.  - She will need further evaluation with EMG/NCS as an outpatient.  - Neurohospitalist service will sign off. Please call if there are additional questions.   Electronically signed: Dr. Kerney Elbe

## 2019-10-07 NOTE — Progress Notes (Signed)
D- Patient alert and oriented. Affect/mood is sullen. Pt denies SI, HI, AVH. Pt received a PRN for pain. Pt attended groups today and was social.   A- Scheduled medications administered to patient, per MD orders. Support and encouragement provided.  Routine safety checks conducted every 15 minutes.  Patient informed to notify staff with problems or concerns.  R- No adverse drug reactions noted. Patient contracts for safety at this time. Patient compliant with medications and treatment plan. Patient receptive, calm, and cooperative. Patient interacts well with others on the unit.  Patient remains safe at this time.  Collier Bullock RN

## 2019-10-08 ENCOUNTER — Inpatient Hospital Stay: Payer: Medicare Other | Admitting: Anesthesiology

## 2019-10-08 ENCOUNTER — Encounter: Payer: Self-pay | Admitting: Psychiatry

## 2019-10-08 LAB — GLUCOSE, CAPILLARY: Glucose-Capillary: 87 mg/dL (ref 70–99)

## 2019-10-08 MED ORDER — KETAMINE HCL 10 MG/ML IJ SOLN
INTRAMUSCULAR | Status: DC | PRN
  Administered 2019-10-08: 70 mg via INTRAVENOUS

## 2019-10-08 MED ORDER — SUCCINYLCHOLINE CHLORIDE 20 MG/ML IJ SOLN
INTRAMUSCULAR | Status: DC | PRN
  Administered 2019-10-08: 70 mg via INTRAVENOUS

## 2019-10-08 MED ORDER — PROPOFOL 10 MG/ML IV BOLUS
INTRAVENOUS | Status: DC | PRN
  Administered 2019-10-08: 70 mg via INTRAVENOUS

## 2019-10-08 MED ORDER — SODIUM CHLORIDE 0.9 % IV SOLN: INTRAVENOUS | Status: DC | PRN

## 2019-10-08 NOTE — Progress Notes (Signed)
Patient calm and pleasant during assessment denying SI/HI/AVH. Patient endorses pain, 5/10 (check MAR). Patient endorses depression. Patient compliant with medication administration per MD orders. Patient stated she went to ECT today and was feeling groggy afterwards. Pt given education, support, and encouragement to be active in her treatment plan. Patient observed by this Probation officer interacting appropriately with staff and peers on the unit. Patient being monitored Q 15 minutes for safety per unit protocol. Pt remains safe on the unit.

## 2019-10-08 NOTE — Anesthesia Preprocedure Evaluation (Signed)
Anesthesia Evaluation  Patient identified by MRN, date of birth, ID band Patient awake    Reviewed: Allergy & Precautions, NPO status , Patient's Chart, lab work & pertinent test results  History of Anesthesia Complications Negative for: history of anesthetic complications  Airway Mallampati: II  TM Distance: >3 FB Neck ROM: Full    Dental  (+) Poor Dentition   Pulmonary neg sleep apnea, neg COPD, Current Smoker and Patient abstained from smoking.,    breath sounds clear to auscultation- rhonchi (-) wheezing      Cardiovascular (-) hypertension(-) CAD, (-) Past MI, (-) Cardiac Stents and (-) CABG  Rhythm:Regular Rate:Normal - Systolic murmurs and - Diastolic murmurs    Neuro/Psych neg Seizures PSYCHIATRIC DISORDERS Anxiety Depression negative neurological ROS     GI/Hepatic Neg liver ROS, hiatal hernia, GERD  ,  Endo/Other  negative endocrine ROSneg diabetes  Renal/GU negative Renal ROS     Musculoskeletal negative musculoskeletal ROS (+)   Abdominal (+) - obese,   Peds  Hematology  (+) anemia ,   Anesthesia Other Findings Past Medical History: 1997: Breast cancer ([place])     Comment:  right breast, radiation No date: Bronchitis     Comment:  recent/ 06/09/15 [facility]/ [facility]               [facility]/resolved 1997: Cancer [facility])     Comment:  right lumpectomy,L/Ad/R  No date: GERD (gastroesophageal reflux disease) No date: History of hiatal hernia No date: Hypercholesteremia No date: Hyperlipidemia No date: Low BP     Comment:  TYPICALLY RUNS 80'S/60'S No date: Panic attack No date: Personal history of malignant neoplasm of breast   Reproductive/Obstetrics                             Anesthesia Physical Anesthesia Plan  ASA: II  Anesthesia Plan: General   Post-op Pain Management:    Induction: Intravenous  PONV Risk Score and Plan: 1 and Ondansetron  Airway  Management Planned: Mask  Additional Equipment:   Intra-op Plan:   Post-operative Plan:   Informed Consent: I have reviewed the patients History and Physical, chart, labs and discussed the procedure including the risks, benefits and alternatives for the proposed anesthesia with the patient or authorized representative who has indicated his/her understanding and acceptance.     Dental advisory given  Plan Discussed with: CRNA and Anesthesiologist  Anesthesia Plan Comments:         Anesthesia Quick Evaluation

## 2019-10-08 NOTE — Progress Notes (Signed)
This Probation officer received report from [place], RN on patient from ECT. It was reported that she woke up early and answered all of her questions. It was also reported that she did really good for her first treatment and would be on the way back to the unit in a few minutes.

## 2019-10-08 NOTE — Plan of Care (Signed)
  Problem: Education: Goal: Knowledge of [place] [facility] Education information/materials will improve Outcome: Progressing Goal: Emotional status will improve Outcome: Progressing Goal: Mental status will improve Outcome: Progressing Goal: Verbalization of understanding the information provided will improve Outcome: Progressing   Problem: Safety: Goal: Periods of time without injury will increase Outcome: Progressing

## 2019-10-08 NOTE — Procedures (Signed)
ECT SERVICES Physician's Interval Evaluation & Treatment Note  Patient Identification: Chelsey [name] MRN:  [id number] Date of Evaluation:  10/08/2019 TX #: 1  MADRS:   MMSE:   P.E. Findings:  Patient has a left-sided foot drop.  Pain in the shoulder.  No new findings.  Cardiovascularly stable  Psychiatric Interval Note:  Depressed flat withdrawn with suicidal thoughts  Subjective:  Patient is a 69 y.o. female seen for evaluation for Electroconvulsive Therapy. Feeling hopeless  Treatment Summary:   [x]   Right Unilateral             []  Bilateral   % Energy : 0.3 ms 70%   Impedance: 2360 ohms  Seizure Energy Index: 1782 V squared  Postictal Suppression Index: No reading  Seizure Concordance Index: 96%  Medications  Pre Shock: Propofol 70 mg ketamine 70 mg succinylcholine 70 mg  Post Shock:    Seizure Duration: 7 seconds by EMG 20 seconds EEG   Comments: Seizure was just barely adequate.  Increased energy and change medicine probably getting rid of the propofol for next treatment on Wednesday  Lungs:  [x]   Clear to auscultation               []  Other:   Heart:    [x]   Regular rhythm             []  irregular rhythm    [x]   Previous H&P reviewed, patient examined and there are NO CHANGES                 []   Previous H&P reviewed, patient examined and there are changes noted.   Alethia Berthold, MD 9/13/20214:06 PM

## 2019-10-08 NOTE — Plan of Care (Signed)
Patient had ECT today and stated that she is feeling better since she had it.   Problem: Education: Goal: Emotional status will improve Outcome: Progressing Goal: Mental status will improve Outcome: Progressing

## 2019-10-08 NOTE — Plan of Care (Signed)
Pt rates depression 7/10, anxiety and hopelessness 6/10. Pt denies SI, HI and AVH. Pt was educated on care plan and verbalizes understanding. Pt was encouraged to attend groups. Collier Bullock RN Problem: Education: Goal: Freight forwarder Education information/materials will improve Outcome: Progressing Goal: Emotional status will improve Outcome: Progressing Goal: Mental status will improve Outcome: Progressing Goal: Verbalization of understanding the information provided will improve Outcome: Progressing   Problem: Safety: Goal: Periods of time without injury will increase Outcome: Progressing   Problem: Education: Goal: Ability to make informed decisions regarding treatment will improve Outcome: Progressing   Problem: Self-Concept: Goal: Ability to disclose and discuss suicidal ideas will improve Outcome: Progressing Goal: Will verbalize positive feelings about self Outcome: Progressing   Problem: Education: Goal: Utilization of techniques to improve thought processes will improve Outcome: Progressing Goal: Knowledge of the prescribed therapeutic regimen will improve Outcome: Progressing   Problem: Activity: Goal: Interest or engagement in leisure activities will improve Outcome: Progressing Goal: Imbalance in normal sleep/wake cycle will improve Outcome: Progressing   Problem: Coping: Goal: Coping ability will improve Outcome: Progressing Goal: Will verbalize feelings Outcome: Progressing   Problem: Health Behavior/Discharge Planning: Goal: Ability to make decisions will improve Outcome: Progressing Goal: Compliance with therapeutic regimen will improve Outcome: Progressing   Problem: Role Relationship: Goal: Will demonstrate positive changes in social behaviors and relationships Outcome: Progressing   Problem: Safety: Goal: Ability to disclose and discuss suicidal ideas will improve Outcome: Progressing Goal: Ability to identify and utilize  support systems that promote safety will improve Outcome: Progressing   Problem: Self-Concept: Goal: Will verbalize positive feelings about self Outcome: Progressing Goal: Level of anxiety will decrease Outcome: Progressing   Problem: Education: Goal: Ability to state activities that reduce stress will improve Outcome: Progressing   Problem: Coping: Goal: Ability to identify and develop effective coping behavior will improve Outcome: Progressing   Problem: Self-Concept: Goal: Ability to identify factors that promote anxiety will improve Outcome: Progressing Goal: Level of anxiety will decrease Outcome: Progressing Goal: Ability to modify response to factors that promote anxiety will improve Outcome: Progressing

## 2019-10-08 NOTE — H&P (Signed)
Chelsey [name] is an 69 y.o. female.   Chief Complaint: severe depression HPI: recurrent depression  Past Medical History:  Diagnosis Date  . Breast cancer ([place]) 1997   right breast, radiation  . Bronchitis    recent/ 06/09/15 had [facility] x[facility]/ [facility]/resolved  . Cancer [facility]) 1997   right lumpectomy,L/Ad/R   . GERD (gastroesophageal reflux disease)   . History of hiatal hernia   . Hypercholesteremia   . Hyperlipidemia   . Low BP    TYPICALLY RUNS 80'S/60'S  . Panic attack   . Personal history of malignant neoplasm of breast     Past Surgical History:  Procedure Laterality Date  . BICEPT TENODESIS Left 11/23/2016   Procedure: BICEPS TENODESIS;  Surgeon: Leim Fabry, MD;  Location: [facility]S;  Service: Orthopedics;  Laterality: Left;  . BREAST EXCISIONAL BIOPSY Right 1997   pos  . BREAST SURGERY Right 1997   lumpectomy  . CHOLECYSTECTOMY N/A 08/06/2016   Procedure: LAPAROSCOPIC CHOLECYSTECTOMY WITH INTRAOPERATIVE CHOLANGIOGRAM;  Surgeon: Christene Lye, MD;  Location: [facility];  Service: General;  Laterality: N/A;  . COLONOSCOPY  2008  . COLONOSCOPY WITH PROPOFOL N/A 07/21/2015   Procedure: COLONOSCOPY WITH PROPOFOL;  Surgeon: Lucilla Lame, MD;  Location: [place];  Service: Endoscopy;  Laterality: N/A;  . COLONOSCOPY WITH PROPOFOL N/A 01/05/2018   Procedure: COLONOSCOPY WITH PROPOFOL;  Surgeon: Jonathon Bellows, MD;  Location: [facility] ENDOSCOPY;  Service: Gastroenterology;  Laterality: N/A;  . ESOPHAGOGASTRODUODENOSCOPY (EGD) WITH PROPOFOL N/A 06/16/2016   Procedure: ESOPHAGOGASTRODUODENOSCOPY (EGD) WITH PROPOFOL;  Surgeon: Christene Lye, MD;  Location: [facility] ENDOSCOPY;  Service: Endoscopy;  Laterality: N/A;  . ESOPHAGOGASTRODUODENOSCOPY (EGD) WITH PROPOFOL N/A 01/05/2018   Procedure: ESOPHAGOGASTRODUODENOSCOPY (EGD) WITH PROPOFOL;  Surgeon: Jonathon Bellows, MD;  Location: [facility] ENDOSCOPY;  Service: Gastroenterology;  Laterality: N/A;  .  ESOPHAGOGASTRODUODENOSCOPY (EGD) WITH PROPOFOL N/A 06/13/2018   Procedure: ESOPHAGOGASTRODUODENOSCOPY (EGD) WITH PROPOFOL;  Surgeon: Jonathon Bellows, MD;  Location: [facility] ENDOSCOPY;  Service: Gastroenterology;  Laterality: N/A;  . ESOPHAGOGASTRODUODENOSCOPY (EGD) WITH PROPOFOL N/A 07/16/2018   Procedure: ESOPHAGOGASTRODUODENOSCOPY (EGD) WITH PROPOFOL;  Surgeon: Lucilla Lame, MD;  Location: [facility] ENDOSCOPY;  Service: Endoscopy;  Laterality: N/A;  . HARDWARE REMOVAL Left 12/27/2016   Procedure: HARDWARE REMOVAL LEFT  PROXIMAL HUMEROUS;  Surgeon: Leim Fabry, MD;  Location: [facility];  Service: Orthopedics;  Laterality: Left;  . IR GJ TUBE CHANGE  04/21/2018  . NISSEN FUNDOPLICATION N/A 1/94/1740   Procedure: NISSEN FUNDOPLICATION;  Surgeon: Jules Husbands, MD;  Location: [facility];  Service: General;  Laterality: N/A;  . ORIF HUMERUS FRACTURE Left 11/23/2016   Procedure: OPEN REDUCTION INTERNAL FIXATION (ORIF) PROXIMAL HUMERUS FRACTURE;  Surgeon: Leim Fabry, MD;  Location: [facility];  Service: Orthopedics;  Laterality: Left;  . REPAIR OF ESOPHAGUS  03/09/2018   Procedure: REPAIR OF ESOPHAGUS;  Surgeon: Jules Husbands, MD;  Location: [facility];  Service: General;;  . REVERSE SHOULDER ARTHROPLASTY Left 12/27/2016   Procedure: REVERSE SHOULDER ARTHROPLASTY;  Surgeon: Leim Fabry, MD;  Location: [facility];  Service: Orthopedics;  Laterality: Left;  . ROBOTIC ASSISTED LAPAROSCOPIC REPAIR OF PARAESOPHAGEAL HERNIA N/A 03/09/2018   Procedure: ROBOTIC HIATAL HERNIA CONVERTED TO OPEN;  Surgeon: Jules Husbands, MD;  Location: [facility];  Service: General;  Laterality: N/A;    Family History  Problem Relation Age of Onset  . Anxiety disorder Brother   . Obesity Brother   . COPD Brother   . Kidney disease Mother   . Heart attack Father   .  Hypertension Father   . Alcohol abuse Father   . Depression Brother   . Anxiety disorder Brother   . Breast cancer Maternal Grandmother    Social History:  reports that she has  been smoking cigarettes. She started smoking about 9 years ago. She has a 1.00 pack-year smoking history. She has never used smokeless tobacco. She reports that she does not drink alcohol and does not use drugs.  Allergies: No Known Allergies  Medications Prior to Admission  Medication Sig Dispense Refill  . bisacodyl (DULCOLAX) 5 MG EC tablet Take 1 tablet (5 mg total) by mouth daily. (Patient not taking: Reported on 09/24/2019) 30 tablet 0  . busPIRone (BUSPAR) 10 MG tablet Take 1 tablet (10 mg total) by mouth 2 (two) times daily. (Patient not taking: Reported on 09/24/2019) 180 tablet 1  . linaclotide (LINZESS) 72 MCG capsule Take 1 capsule (72 mcg total) by mouth daily before breakfast. 30 capsule 2  . omeprazole (PRILOSEC) 20 MG capsule Take 1 capsule (20 mg total) by mouth 2 (two) times daily before a meal. 180 capsule 3  . PARoxetine (PAXIL) 20 MG tablet Take 1 tablet (20 mg total) by mouth daily before supper. 90 tablet 1  . polyethylene glycol (MIRALAX / GLYCOLAX) 17 g packet Take 17 g by mouth daily. 14 each 0  . Simethicone (GAS-X PO) Take 1-2 tablets by mouth daily as needed (gas).    . simvastatin (ZOCOR) 20 MG tablet TAKE ONE TABLET BY MOUTH AT BEDTIME (Patient not taking: Reported on 07/12/2019) 90 tablet 1  . traZODone (DESYREL) 100 MG tablet Take 1 tablet (100 mg total) by mouth at bedtime. 90 tablet 1    Results for orders placed or performed during the hospital encounter of 09/25/19 (from the past 48 hour(s))  Glucose, capillary     Status: None   Collection Time: 10/08/19  6:28 AM  Result Value Ref Range   Glucose-Capillary 87 70 - 99 mg/dL    Comment: Glucose reference range applies only to samples taken after fasting for at least 8 hours.   Comment 1 Notify RN    MR BRAIN WO CONTRAST  Result Date: 10/06/2019 CLINICAL DATA:  Neuro deficit, acute, stroke suspected. Progressive chronic dizziness. EXAM: MRI HEAD WITHOUT CONTRAST TECHNIQUE: Multiplanar, multiecho pulse  sequences of the brain and surrounding structures were obtained without intravenous contrast. COMPARISON:  CT head without contrast 09/24/2019 FINDINGS: Brain: No acute infarct, hemorrhage, or mass lesion is present. The ventricles are of normal size. Mild periventricular and subcortical T2 hyperintensities are likely within normal limits for age. Basal ganglia are intact. No acute or focal cortical abnormalities are present. No significant acute or remote blood products are present. Vascular: Flow is present in the major intracranial arteries. Globes and orbits are within normal limits. Skull and upper cervical spine: The craniocervical junction is normal. Upper cervical spine is within normal limits. Marrow signal is unremarkable. Sinuses/Orbits: The paranasal sinuses and mastoid air cells are clear. The globes and orbits are within normal limits. IMPRESSION: Normal MRI appearance of the brain for age. No acute or focal lesion to explain the patient's symptoms. Electronically Signed   By: San Morelle M.D.   On: 10/06/2019 14:17   MR LUMBAR SPINE WO CONTRAST  Result Date: 10/06/2019 CLINICAL DATA:  Low back and left lower extremity pain. EXAM: MRI LUMBAR SPINE WITHOUT CONTRAST TECHNIQUE: Multiplanar, multisequence MR imaging of the lumbar spine was performed. No intravenous contrast was administered. COMPARISON:  None. FINDINGS: Segmentation:  There are five lumbar type vertebral bodies. The last full intervertebral disc space is labeled L5-S1. Alignment:  Normal Vertebrae: Normal marrow signal. No bone lesions or fractures. Endplate reactive changes are noted at L3-4. L1 hemangioma noted. Conus medullaris and cauda equina: Conus extends to the L1 level. Conus and cauda equina appear normal. Paraspinal and other soft tissues: No significant paraspinal or retroperitoneal findings. Disc levels: T12-L1: No significant findings. L1-2: Mild facet disease but no disc protrusions, spinal or foraminal stenosis.  L2-3: Mild facet disease but no disc protrusions, spinal or foraminal stenosis. Mild annular bulge with slight lateral recess encroachment bilaterally. L3-4: Mild annular bulge and moderate facet disease but no significant spinal or foraminal stenosis. Mild lateral recess narrowing bilaterally. L4-5: Mild degenerative anterolisthesis of L4 with a slight bulging uncovered disc. There is also moderate to advanced facet disease with early spinal and lateral recess stenosis. No foraminal stenosis. L5-S1: Moderate facet disease but no disc protrusions, spinal or foraminal stenosis. IMPRESSION: 1. Mild lateral recess encroachment bilaterally at L2-3 and L3-4. 2. Early spinal and lateral recess stenosis at L4-5. Electronically Signed   By: Marijo Sanes M.D.   On: 10/06/2019 14:08    Review of Systems  Constitutional: Positive for fatigue and unexpected weight change.  HENT: Negative.   Eyes: Negative.   Respiratory: Negative.   Cardiovascular: Negative.   Gastrointestinal: Negative.   Musculoskeletal: Negative.   Skin: Negative.   Neurological: Negative.   Psychiatric/Behavioral: Positive for dysphoric mood and sleep disturbance. The patient is nervous/anxious.     Blood pressure 126/79, pulse 78, temperature 98.1 F (36.7 C), temperature source Oral, resp. rate 18, height 5\' 5"  (1.651 m), weight 54.4 kg, SpO2 100 %. Physical Exam Vitals and nursing note reviewed.  Constitutional:      Appearance: She is well-developed.  HENT:     Head: Normocephalic and atraumatic.  Eyes:     Conjunctiva/sclera: Conjunctivae normal.     Pupils: Pupils are equal, round, and reactive to light.  Cardiovascular:     Heart sounds: Normal heart sounds.  Pulmonary:     Effort: Pulmonary effort is normal.  Abdominal:     Palpations: Abdomen is soft.  Musculoskeletal:        General: Normal range of motion.     Cervical back: Normal range of motion.  Skin:    General: Skin is warm and dry.  Neurological:      Mental Status: She is alert.  Psychiatric:        Attention and Perception: Attention normal.        Mood and Affect: Mood is depressed. Affect is flat.        Speech: Speech is delayed.        Behavior: Behavior is slowed.        Thought Content: Thought content includes suicidal ideation. Thought content does not include suicidal plan.        Cognition and Memory: Memory is impaired.        Judgment: Judgment normal.      Assessment/Plan Start rul ect  Alethia Berthold, MD 10/08/2019, 10:13 AM

## 2019-10-08 NOTE — Anesthesia Postprocedure Evaluation (Signed)
Anesthesia Post Note  Patient: Chelsey [name]  Procedure(s) Performed: ECT TX  Patient location during evaluation: PACU Anesthesia Type: General Level of consciousness: awake and alert Pain management: pain level controlled Vital Signs Assessment: post-procedure vital signs reviewed and stable Respiratory status: spontaneous breathing, nonlabored ventilation and respiratory function stable Cardiovascular status: blood pressure returned to baseline and stable Postop Assessment: no signs of nausea or vomiting Anesthetic complications: no   No complications documented.   Last Vitals:  Vitals:   10/08/19 1315 10/08/19 1324  BP: 132/80 132/83  Pulse: 79 76  Resp: 10 14  Temp:  (!) 36.3 C  SpO2: 97% 100%    Last Pain:  Vitals:   10/08/19 1330  TempSrc:   PainSc: 6                  [name]

## 2019-10-08 NOTE — Progress Notes (Signed)
[facility] MD Progress Note  10/08/2019 4:03 PM MAAT KAFER  MRN:  [id number] Subjective: Follow-up for this patient with depression.  Patient continues to endorse depressed mood fatigue lack of energy lack of motivation and passive suicidal ideation.  She had electroconvulsive therapy this morning without incident.  Short seizure.  No complication.  After coming back to the ward was eating lunch and felt stable with no change. Principal Problem: Major depressive disorder, recurrent severe without psychotic features ([place]) Diagnosis: Principal Problem:   Major depressive disorder, recurrent severe without psychotic features ([place]) Active Problems:   Gastroesophageal reflux disease without esophagitis   Chronic constipation   Chronic pain syndrome  Total Time spent with patient: 30 minutes  Past Psychiatric History: Past history of recurrent depression recent serious suicide attempt  Past Medical History:  Past Medical History:  Diagnosis Date  . Breast cancer ([place]) 1997   right breast, radiation  . Bronchitis    recent/ 06/09/15 had [facility]/ [facility]/resolved  . Cancer [facility]) 1997   right lumpectomy,L/Ad/R   . GERD (gastroesophageal reflux disease)   . History of hiatal hernia   . Hypercholesteremia   . Hyperlipidemia   . Low BP    TYPICALLY RUNS 80'S/60'S  . Panic attack   . Personal history of malignant neoplasm of breast     Past Surgical History:  Procedure Laterality Date  . BICEPT TENODESIS Left 11/23/2016   Procedure: BICEPS TENODESIS;  Surgeon: Leim Fabry, MD;  Location: [facility];  Service: Orthopedics;  Laterality: Left;  . BREAST EXCISIONAL BIOPSY Right 1997   pos  . BREAST SURGERY Right 1997   lumpectomy  . CHOLECYSTECTOMY N/A 08/06/2016   Procedure: LAPAROSCOPIC CHOLECYSTECTOMY WITH INTRAOPERATIVE CHOLANGIOGRAM;  Surgeon: Christene Lye, MD;  Location: [facility];  Service: General;  Laterality: N/A;  . COLONOSCOPY  2008  . COLONOSCOPY WITH  PROPOFOL N/A 07/21/2015   Procedure: COLONOSCOPY WITH PROPOFOL;  Surgeon: Lucilla Lame, MD;  Location: [place];  Service: Endoscopy;  Laterality: N/A;  . COLONOSCOPY WITH PROPOFOL N/A 01/05/2018   Procedure: COLONOSCOPY WITH PROPOFOL;  Surgeon: Jonathon Bellows, MD;  Location: [facility] ENDOSCOPY;  Service: Gastroenterology;  Laterality: N/A;  . ESOPHAGOGASTRODUODENOSCOPY (EGD) WITH PROPOFOL N/A 06/16/2016   Procedure: ESOPHAGOGASTRODUODENOSCOPY (EGD) WITH PROPOFOL;  Surgeon: Christene Lye, MD;  Location: [facility] ENDOSCOPY;  Service: Endoscopy;  Laterality: N/A;  . ESOPHAGOGASTRODUODENOSCOPY (EGD) WITH PROPOFOL N/A 01/05/2018   Procedure: ESOPHAGOGASTRODUODENOSCOPY (EGD) WITH PROPOFOL;  Surgeon: Jonathon Bellows, MD;  Location: [facility] ENDOSCOPY;  Service: Gastroenterology;  Laterality: N/A;  . ESOPHAGOGASTRODUODENOSCOPY (EGD) WITH PROPOFOL N/A 06/13/2018   Procedure: ESOPHAGOGASTRODUODENOSCOPY (EGD) WITH PROPOFOL;  Surgeon: Jonathon Bellows, MD;  Location: [facility] ENDOSCOPY;  Service: Gastroenterology;  Laterality: N/A;  . ESOPHAGOGASTRODUODENOSCOPY (EGD) WITH PROPOFOL N/A 07/16/2018   Procedure: ESOPHAGOGASTRODUODENOSCOPY (EGD) WITH PROPOFOL;  Surgeon: Lucilla Lame, MD;  Location: [facility] ENDOSCOPY;  Service: Endoscopy;  Laterality: N/A;  . HARDWARE REMOVAL Left 12/27/2016   Procedure: HARDWARE REMOVAL LEFT  PROXIMAL HUMEROUS;  Surgeon: Leim Fabry, MD;  Location: [facility];  Service: Orthopedics;  Laterality: Left;  . IR GJ TUBE CHANGE  04/21/2018  . NISSEN FUNDOPLICATION N/A 9/92/4268   Procedure: NISSEN FUNDOPLICATION;  Surgeon: Jules Husbands, MD;  Location: [facility];  Service: General;  Laterality: N/A;  . ORIF HUMERUS FRACTURE Left 11/23/2016   Procedure: OPEN REDUCTION INTERNAL FIXATION (ORIF) PROXIMAL HUMERUS FRACTURE;  Surgeon: Leim Fabry, MD;  Location: [facility];  Service: Orthopedics;  Laterality: Left;  . REPAIR OF ESOPHAGUS  03/09/2018   Procedure: REPAIR OF ESOPHAGUS;  Surgeon: Jules Husbands, MD;   Location: [facility];  Service: General;;  . REVERSE SHOULDER ARTHROPLASTY Left 12/27/2016   Procedure: REVERSE SHOULDER ARTHROPLASTY;  Surgeon: Leim Fabry, MD;  Location: [facility];  Service: Orthopedics;  Laterality: Left;  . ROBOTIC ASSISTED LAPAROSCOPIC REPAIR OF PARAESOPHAGEAL HERNIA N/A 03/09/2018   Procedure: ROBOTIC HIATAL HERNIA CONVERTED TO OPEN;  Surgeon: Jules Husbands, MD;  Location: [facility];  Service: General;  Laterality: N/A;   Family History:  Family History  Problem Relation Age of Onset  . Anxiety disorder Brother   . Obesity Brother   . COPD Brother   . Kidney disease Mother   . Heart attack Father   . Hypertension Father   . Alcohol abuse Father   . Depression Brother   . Anxiety disorder Brother   . Breast cancer Maternal Grandmother    Family Psychiatric  History: See previous Social History:  Social History   Substance and Sexual Activity  Alcohol Use No  . Alcohol/week: 0.0 standard drinks     Social History   Substance and Sexual Activity  Drug Use No    Social History   Socioeconomic History  . Marital status: Widowed    Spouse name: Not on file  . Number of children: 2  . Years of education: Not on file  . Highest education level: Bachelor's degree (e.g., BA, AB, BS)  Occupational History  . Not on file  Tobacco Use  . Smoking status: Current Some Day Smoker    Packs/day: 0.10    Years: 10.00    Pack years: 1.00    Types: Cigarettes    Start date: 11/25/2009  . Smokeless tobacco: Never Used  . Tobacco comment: 1 cigarette daily  Vaping Use  . Vaping Use: Never used  Substance and Sexual Activity  . Alcohol use: No    Alcohol/week: 0.0 standard drinks  . Drug use: No  . Sexual activity: Never  Other Topics Concern  . Not on file  Social History Narrative  . Not on file   Social Determinants of Health   Financial Resource Strain:   . Difficulty of Paying Living Expenses: Not on file  Food Insecurity:   . Worried About  Charity fundraiser in the Last Year: Not on file  . Ran Out of Food in the Last Year: Not on file  Transportation Needs:   . Lack of Transportation (Medical): Not on file  . Lack of Transportation (Non-Medical): Not on file  Physical Activity:   . Days of Exercise per Week: Not on file  . Minutes of Exercise per Session: Not on file  Stress:   . Feeling of Stress : Not on file  Social Connections:   . Frequency of Communication with Friends and Family: Not on file  . Frequency of Social Gatherings with Friends and Family: Not on file  . Attends Religious Services: Not on file  . Active Member of Clubs or Organizations: Not on file  . Attends Archivist Meetings: Not on file  . Marital Status: Not on file   Additional Social History:                         Sleep: Fair  Appetite:  Fair  Current Medications: Current Facility-Administered Medications  Medication Dose Route Frequency Provider Last Rate Last Admin  . acetaminophen (TYLENOL) tablet 650 mg  650 mg Oral  Q6H PRN Patrecia Pour, NP   650 mg at 10/08/19 1335  . alum & mag hydroxide-simeth (MAALOX/MYLANTA) 200-200-20 MG/5ML suspension 30 mL  30 mL Oral Q4H PRN Patrecia Pour, NP   30 mL at 10/07/19 2144  . buPROPion (WELLBUTRIN XL) 24 hr tablet 300 mg  300 mg Oral Daily Ohanna Gassert T, MD   300 mg at 10/08/19 1334  . busPIRone (BUSPAR) tablet 10 mg  10 mg Oral TID Alizeh Madril, Madie Reno, MD   10 mg at 10/08/19 1333  . docusate sodium (COLACE) capsule 100 mg  100 mg Oral BID Yanelle Sousa, Madie Reno, MD   100 mg at 10/08/19 1333  . ibuprofen (ADVIL) tablet 600 mg  600 mg Oral Q6H PRN Jamy Whyte, Madie Reno, MD   600 mg at 10/07/19 1142  . linaclotide (LINZESS) capsule 72 mcg  72 mcg Oral Q0600 Hampton Abbot, MD   72 mcg at 10/07/19 0531  . magnesium hydroxide (MILK OF MAGNESIA) suspension 30 mL  30 mL Oral Daily PRN Patrecia Pour, NP   30 mL at 10/06/19 0829  . OLANZapine (ZYPREXA) tablet 5 mg  5 mg Oral QHS Olamide Carattini  T, MD   5 mg at 10/07/19 2120  . pantoprazole (PROTONIX) EC tablet 40 mg  40 mg Oral Daily Patrecia Pour, NP   40 mg at 10/08/19 0805  . polyethylene glycol (MIRALAX / GLYCOLAX) packet 17 g  17 g Oral Daily Patrecia Pour, NP   17 g at 10/07/19 0817  . senna-docusate (Senokot-S) tablet 2 tablet  2 tablet Oral BID PRN Dontarious Schaum T, MD      . traZODone (DESYREL) tablet 100 mg  100 mg Oral QHS Asta Corbridge, Madie Reno, MD   100 mg at 10/07/19 2120    Lab Results:  Results for orders placed or performed during the hospital encounter of 09/25/19 (from the past 48 hour(s))  Glucose, capillary     Status: None   Collection Time: 10/08/19  6:28 AM  Result Value Ref Range   Glucose-Capillary 87 70 - 99 mg/dL    Comment: Glucose reference range applies only to samples taken after fasting for at least 8 hours.   Comment 1 Notify RN     Blood Alcohol level:  Lab Results  Component Value Date   ETH <10 09/24/2019   ETH <10 37/16/9678    Metabolic Disorder Labs: No results found for: HGBA1C, MPG No results found for: PROLACTIN Lab Results  Component Value Date   CHOL 164 10/02/2019   TRIG 117 10/02/2019   HDL 52 10/02/2019   CHOLHDL 3.2 10/02/2019   VLDL 23 10/02/2019   LDLCALC 89 10/02/2019   LDLCALC 87 07/27/2017    Physical Findings: AIMS:  , ,  ,  ,    CIWA:    COWS:     Musculoskeletal: Strength & Muscle Tone: within normal limits Gait & Station: normal Patient leans: N/A  Psychiatric Specialty Exam: Physical Exam Vitals and nursing note reviewed.  Constitutional:      Appearance: She is well-developed.  HENT:     Head: Normocephalic and atraumatic.  Eyes:     Conjunctiva/sclera: Conjunctivae normal.     Pupils: Pupils are equal, round, and reactive to light.  Cardiovascular:     Heart sounds: Normal heart sounds.  Pulmonary:     Effort: Pulmonary effort is normal.  Abdominal:     Palpations: Abdomen is soft.  Musculoskeletal:  General: Normal range of  motion.     Cervical back: Normal range of motion.  Skin:    General: Skin is warm and dry.  Neurological:     General: No focal deficit present.     Mental Status: She is alert.  Psychiatric:        Attention and Perception: She is inattentive.        Mood and Affect: Mood is anxious and depressed.        Speech: Speech is delayed.        Behavior: Behavior is slowed.        Thought Content: Thought content includes suicidal ideation. Thought content does not include suicidal plan.        Cognition and Memory: Cognition normal.        Judgment: Judgment normal.     Review of Systems  Constitutional: Negative.   HENT: Negative.   Eyes: Negative.   Respiratory: Negative.   Cardiovascular: Negative.   Gastrointestinal: Negative.   Musculoskeletal: Negative.   Skin: Negative.   Neurological: Negative.   Psychiatric/Behavioral: Positive for dysphoric mood and suicidal ideas.    Blood pressure 132/83, pulse 76, temperature (!) 97.4 F (36.3 C), resp. rate 14, height 5\' 5"  (1.651 m), weight 54.3 kg, SpO2 100 %.Body mass index is 19.92 kg/m.  General Appearance: Disheveled  Eye Contact:  Minimal  Speech:  Slow  Volume:  Decreased  Mood:  Depressed  Affect:  Constricted and Depressed  Thought Process:  Disorganized  Orientation:  Full (Time, Place, and Person)  Thought Content:  Rumination and Tangential  Suicidal Thoughts:  Yes.  without intent/plan  Homicidal Thoughts:  No  Memory:  Immediate;   Fair Recent;   Fair Remote;   Fair  Judgement:  Fair  Insight:  Fair  Psychomotor Activity:  Normal  Concentration:  Concentration: Fair  Recall:  [employer] of Knowledge:  Fair  Language:  Fair  Akathisia:  No  Handed:  Right  AIMS (if indicated):     Assets:  Desire for Improvement  ADL's:  Impaired  Cognition:  Impaired,  Mild  Sleep:  Number of Hours: 8     Treatment Plan Summary: Daily contact with patient to assess and evaluate symptoms and progress in  treatment, Medication management and Plan Plan is to continue ECT.  Patient has no transportation at home to allow for coming in for treatment.  I have made changes in her medicine discontinuing the Paxil putting her on appropriate strength Wellbutrin.  Alethia Berthold, MD 10/08/2019, 4:03 PM

## 2019-10-08 NOTE — Progress Notes (Signed)
Recreation Therapy Notes   Date: 10/08/2019  Time: 9:30 am   Location: Craft room     Behavioral response: N/A   Intervention Topic: Happiness   Discussion/Intervention: Patient did not attend group.   Clinical Observations/Feedback:  Patient did not attend group.   Isabela Nardelli LRT/CTRS        Annakate Soulier 10/08/2019 12:45 PM

## 2019-10-08 NOTE — Progress Notes (Signed)
Patient has continued to appear to be sad and withdrawn. Affect is flat. Denies SI, HI and AVH. Instructed patient that she is NPO after midnight and reminded her that she is not to eat or drink anything in the morning. Verbalizes understanding. Only concern voiced about ECT was whether or not it would be done early in the day because she would get hungry if it is done later in the day.

## 2019-10-08 NOTE — Progress Notes (Signed)
D- Patient alert and oriented. Affect/mood is sullen and depressed. Pt denies SI, HI, AVH, and pain. Pt has had a PRN today for a headache after ECT.Pt has been social and went outside.   A- Scheduled medications administered to patient, per MD orders. Support and encouragement provided.  Routine safety checks conducted every 15 minutes.  Patient informed to notify staff with problems or concerns.  R- No adverse drug reactions noted. Patient contracts for safety at this time. Patient compliant with medications and treatment plan. Patient receptive, calm, and cooperative. Patient interacts well with others on the unit.  Patient remains safe at this time.  Collier Bullock RN

## 2019-10-08 NOTE — Transfer of Care (Signed)
Immediate Anesthesia Transfer of Care Note  Patient: Chelsey [name]  Procedure(s) Performed: ECT TX  Patient Location: PACU  Anesthesia Type:General  Level of Consciousness: drowsy and patient cooperative  Airway & Oxygen Therapy: Patient Spontanous Breathing  Post-op Assessment: Report given to RN and Post -op Vital signs reviewed and stable  Post vital signs: Reviewed and stable  Last Vitals:  Vitals Value Taken Time  BP 134/76 10/08/19 1305  Temp 36.4 C 10/08/19 1255  Pulse 88 10/08/19 1307  Resp 13 10/08/19 1307  SpO2 98 % 10/08/19 1307  Vitals shown include unvalidated device data.  Last Pain:  Vitals:   10/08/19 1255  TempSrc:   PainSc: Asleep      Patients Stated Pain Goal: 0 (40/81/44 [id number])  Complications: No complications documented.

## 2019-10-09 ENCOUNTER — Other Ambulatory Visit: Payer: Self-pay | Admitting: Psychiatry

## 2019-10-09 NOTE — Plan of Care (Signed)
Patient endorses depression but did state she was feeling better and thinks that ECT is helping her   Problem: Education: Goal: Emotional status will improve Outcome: Progressing Goal: Mental status will improve Outcome: Progressing

## 2019-10-09 NOTE — Progress Notes (Signed)
Recreation Therapy Notes   Date: 10/09/2019  Time: 9:30 am   Location: Craft room  Behavioral response: Appropriate  Intervention Topic: Goals  Discussion/Intervention:  Group content on today was focused on goals. Patients described what goals are and how they define goals. Individuals expressed how they go about setting goals and reaching them. The group identified how important goals are and if they make short term goals to reach long term goals. Patients described how many goals they work on at a time and what affects them not reaching their goal. Individuals described how much time they put into planning and obtaining their goals. The group participated in the intervention "My Goal Board" and made personal goal boards to help them achieve their goal.  Clinical Observations/Feedback:  Patient came to group and expressed that she has been feeling so bad for so long that it is hard for her to make small short term goals for herself. Individual was social with peers and staff while participating in the intervention.  Devonta Blanford LRT/CTRS          Iliza Blankenbeckler 10/09/2019 1:39 PM

## 2019-10-09 NOTE — Progress Notes (Signed)
Pt is alert and oriented to person, place, time and situation. Pt is calm, cooperative, denies suicidal and homicidal ideation, denies hallucinations, reports feelings of depression, c/o of the noise on the unit from another peer that has been very loud, pt requested and was given PRN Tylenol for c/o headache. Pt has been quiet, pleasant, polite, affect is flat, eye contact fair. Pt participates with unit programming and went with peer group and MHTs for outdoor time. Will continues to monitor pt per Q15 minute face checks and monitor for safety and progress.

## 2019-10-09 NOTE — Progress Notes (Signed)
[facility] MD Progress Note  10/09/2019 4:16 PM Chelsey [name]  MRN:  [id number] Subjective: Follow-up for this patient with severe depression.  Patient reports feeling slightly better today.  Indeed she actually makes better eye contact and her affect looks a little brighter.  Still complains of chronic leg and shoulder weakness and pain but nothing different than usual.  Seems to be a little more energetic.  No sign of psychosis.  No complaints of memory problems Principal Problem: Major depressive disorder, recurrent severe without psychotic features ([place]) Diagnosis: Principal Problem:   Major depressive disorder, recurrent severe without psychotic features ([place]) Active Problems:   Gastroesophageal reflux disease without esophagitis   Chronic constipation   Chronic pain syndrome  Total Time spent with patient: 30 minutes  Past Psychiatric History: Past history of recurrent depression  Past Medical History:  Past Medical History:  Diagnosis Date  . Breast cancer ([place]) 1997   right breast, radiation  . Bronchitis    recent/ 06/09/15 [facility]/resolved  . Cancer [facility]) 1997   right lumpectomy,L/Ad/R   . GERD (gastroesophageal reflux disease)   . History of hiatal hernia   . Hypercholesteremia   . Hyperlipidemia   . Low BP    TYPICALLY RUNS 80'S/60'S  . Panic attack   . Personal history of malignant neoplasm of breast     Past Surgical History:  Procedure Laterality Date  . BICEPT TENODESIS Left 11/23/2016   Procedure: BICEPS TENODESIS;  Surgeon: Leim Fabry, MD;  Location: [facility] ORS;  Service: Orthopedics;  Laterality: Left;  . BREAST EXCISIONAL BIOPSY Right 1997   pos  . BREAST SURGERY Right 1997   lumpectomy  . CHOLECYSTECTOMY N/A 08/06/2016   Procedure: LAPAROSCOPIC CHOLECYSTECTOMY WITH INTRAOPERATIVE CHOLANGIOGRAM;  Surgeon: Christene Lye, MD;  Location: [facility] ORS;  Service: General;  Laterality: N/A;  . COLONOSCOPY  2008  . COLONOSCOPY WITH  PROPOFOL N/A 07/21/2015   Procedure: COLONOSCOPY WITH PROPOFOL;  Surgeon: Lucilla Lame, MD;  Location: [place];  Service: Endoscopy;  Laterality: N/A;  . COLONOSCOPY WITH PROPOFOL N/A 01/05/2018   Procedure: COLONOSCOPY WITH PROPOFOL;  Surgeon: Jonathon Bellows, MD;  Location: [facility] ENDOSCOPY;  Service: Gastroenterology;  Laterality: N/A;  . ESOPHAGOGASTRODUODENOSCOPY (EGD) WITH PROPOFOL N/A 06/16/2016   Procedure: ESOPHAGOGASTRODUODENOSCOPY (EGD) WITH PROPOFOL;  Surgeon: Christene Lye, MD;  Location: [facility] ENDOSCOPY;  Service: Endoscopy;  Laterality: N/A;  . ESOPHAGOGASTRODUODENOSCOPY (EGD) WITH PROPOFOL N/A 01/05/2018   Procedure: ESOPHAGOGASTRODUODENOSCOPY (EGD) WITH PROPOFOL;  Surgeon: Jonathon Bellows, MD;  Location[facility] ENDOSCOPY;  Service: Gastroenterology;  Laterality: N/A;  . ESOPHAGOGASTRODUODENOSCOPY (EGD) WITH PROPOFOL N/A 06/13/2018   Procedure: ESOPHAGOGASTRODUODENOSCOPY (EGD) WITH PROPOFOL;  Surgeon: Jonathon Bellows, MD;  Location: [facility] ENDOSCOPY;  Service: Gastroenterology;  Laterality: N/A;  . ESOPHAGOGASTRODUODENOSCOPY (EGD) WITH PROPOFOL N/A 07/16/2018   Procedure: ESOPHAGOGASTRODUODENOSCOPY (EGD) WITH PROPOFOL;  Surgeon: Lucilla Lame, MD;  Location: [facility] ENDOSCOPY;  Service: Endoscopy;  Laterality: N/A;  . HARDWARE REMOVAL Left 12/27/2016   Procedure: HARDWARE REMOVAL LEFT  PROXIMAL HUMEROUS;  Surgeon: Leim Fabry, MD;  Location: [facility] ORS;  Service: Orthopedics;  Laterality: Left;  . IR [facility] TUBE CHANGE  04/21/2018  . NISSEN FUNDOPLICATION N/A 2/77/8242   Procedure: NISSEN FUNDOPLICATION;  Surgeon: Jules Husbands, MD;  Location: [facility] ORS;  Service: General;  Laterality: N/A;  . ORIF HUMERUS FRACTURE Left 11/23/2016   Procedure: OPEN REDUCTION INTERNAL FIXATION (ORIF) PROXIMAL HUMERUS FRACTURE;  Surgeon: Leim Fabry, MD;  Location: [facility] ORS;  Service: Orthopedics;  Laterality: Left;  .  REPAIR OF ESOPHAGUS  03/09/2018   Procedure: REPAIR OF ESOPHAGUS;  Surgeon: Jules Husbands, MD;   Location: [facility];  Service: General;;  . REVERSE SHOULDER ARTHROPLASTY Left 12/27/2016   Procedure: REVERSE SHOULDER ARTHROPLASTY;  Surgeon: Leim Fabry, MD;  Location: [facility];  Service: Orthopedics;  Laterality: Left;  . ROBOTIC ASSISTED LAPAROSCOPIC REPAIR OF PARAESOPHAGEAL HERNIA N/A 03/09/2018   Procedure: ROBOTIC HIATAL HERNIA CONVERTED TO OPEN;  Surgeon: Jules Husbands, MD;  Location: [facility] ORS;  Service: General;  Laterality: N/A;   Family History:  Family History  Problem Relation Age of Onset  . Anxiety disorder Brother   . Obesity Brother   . COPD Brother   . Kidney disease Mother   . Heart attack Father   . Hypertension Father   . Alcohol abuse Father   . Depression Brother   . Anxiety disorder Brother   . Breast cancer Maternal Grandmother    Family Psychiatric  History: See previous Social History:  Social History   Substance and Sexual Activity  Alcohol Use No  . Alcohol/week: 0.0 standard drinks     Social History   Substance and Sexual Activity  Drug Use No    Social History   Socioeconomic History  . Marital status: Widowed    Spouse name: Not on file  . Number of children: 2  . Years of education: Not on file  . Highest education level: Bachelor's degree (e.g., BA, AB, BS)  Occupational History  . Not on file  Tobacco Use  . Smoking status: Current Some Day Smoker    Packs/day: 0.10    Years: 10.00    Pack years: 1.00    Types: Cigarettes    Start date: 11/25/2009  . Smokeless tobacco: Never Used  . Tobacco comment: 1 cigarette daily  Vaping Use  . Vaping Use: Never used  Substance and Sexual Activity  . Alcohol use: No    Alcohol/week: 0.0 standard drinks  . Drug use: No  . Sexual activity: Never  Other Topics Concern  . Not on file  Social History Narrative  . Not on file   Social Determinants of Health   Financial Resource Strain:   . Difficulty of Paying Living Expenses: Not on file  Food Insecurity:   . Worried About  Charity fundraiser in the Last Year: Not on file  . Ran Out of Food in the Last Year: Not on file  Transportation Needs:   . Lack of Transportation (Medical): Not on file  . Lack of Transportation (Non-Medical): Not on file  Physical Activity:   . Days of Exercise per Week: Not on file  . Minutes of Exercise per Session: Not on file  Stress:   . Feeling of Stress : Not on file  Social Connections:   . Frequency of Communication with Friends and Family: Not on file  . Frequency of Social Gatherings with Friends and Family: Not on file  . Attends Religious Services: Not on file  . Active Member of Clubs or Organizations: Not on file  . Attends Archivist Meetings: Not on file  . Marital Status: Not on file   Additional Social History:                         Sleep: Fair  Appetite:  Fair  Current Medications: Current Facility-Administered Medications  Medication Dose Route Frequency Provider Last Rate Last Admin  . acetaminophen (TYLENOL) tablet 650 mg  650 mg Oral Q6H PRN Patrecia Pour, NP   650 mg at 10/08/19 1335  . alum & mag hydroxide-simeth (MAALOX/MYLANTA) 200-200-20 MG/5ML suspension 30 mL  30 mL Oral Q4H PRN Patrecia Pour, NP   30 mL at 10/08/19 2235  . buPROPion (WELLBUTRIN XL) 24 hr tablet 300 mg  300 mg Oral Daily Lemya Greenwell, Madie Reno, MD   300 mg at 10/09/19 0805  . busPIRone (BUSPAR) tablet 10 mg  10 mg Oral TID Ruhee Enck, Madie Reno, MD   10 mg at 10/09/19 1220  . docusate sodium (COLACE) capsule 100 mg  100 mg Oral BID Zylan Almquist, Madie Reno, MD   100 mg at 10/09/19 0805  . ibuprofen (ADVIL) tablet 600 mg  600 mg Oral Q6H PRN Nolyn Eilert, Madie Reno, MD   600 mg at 10/08/19 2118  . linaclotide (LINZESS) capsule 72 mcg  72 mcg Oral Q0600 Hampton Abbot, MD   72 mcg at 10/09/19 0610  . magnesium hydroxide (MILK OF MAGNESIA) suspension 30 mL  30 mL Oral Daily PRN Patrecia Pour, NP   30 mL at 10/09/19 1220  . OLANZapine (ZYPREXA) tablet 5 mg  5 mg Oral QHS Joseluis Alessio, Madie Reno, MD   5 mg at 10/08/19 2118  . pantoprazole (PROTONIX) EC tablet 40 mg  40 mg Oral Daily Patrecia Pour, NP   40 mg at 10/09/19 0805  . polyethylene glycol (MIRALAX / GLYCOLAX) packet 17 g  17 g Oral Daily Patrecia Pour, NP   17 g at 10/09/19 0804  . senna-docusate (Senokot-S) tablet 2 tablet  2 tablet Oral BID PRN Janey Petron T, MD      . traZODone (DESYREL) tablet 100 mg  100 mg Oral QHS Rache Klimaszewski, Madie Reno, MD   100 mg at 10/08/19 2118    Lab Results:  Results for orders placed or performed during the hospital encounter of 09/25/19 (from the past 48 hour(s))  Glucose, capillary     Status: None   Collection Time: 10/08/19  6:28 AM  Result Value Ref Range   Glucose-Capillary 87 70 - 99 mg/dL    Comment: Glucose reference range applies only to samples taken after fasting for at least 8 hours.   Comment 1 Notify RN     Blood Alcohol level:  Lab Results  Component Value Date   ETH <10 09/24/2019   ETH <10 54/27/0623    Metabolic Disorder Labs: No results found for: HGBA1C, MPG No results found for: PROLACTIN Lab Results  Component Value Date   CHOL 164 10/02/2019   TRIG 117 10/02/2019   HDL 52 10/02/2019   CHOLHDL 3.2 10/02/2019   VLDL 23 10/02/2019   LDLCALC 89 10/02/2019   LDLCALC 87 07/27/2017    Physical Findings: AIMS:  , ,  ,  ,    CIWA:    COWS:     Musculoskeletal: Strength & Muscle Tone: within normal limits Gait & Station: normal Patient leans: Backward  Psychiatric Specialty Exam: Physical Exam Vitals and nursing note reviewed.  Constitutional:      Appearance: She is well-developed.  HENT:     Head: Normocephalic and atraumatic.  Eyes:     Conjunctiva/sclera: Conjunctivae normal.     Pupils: Pupils are equal, round, and reactive to light.  Cardiovascular:     Heart sounds: Normal heart sounds.  Pulmonary:     Effort: Pulmonary effort is normal.  Abdominal:     Palpations: Abdomen is soft.  Musculoskeletal:  General: Normal range of  motion.     Cervical back: Normal range of motion.  Skin:    General: Skin is warm and dry.  Neurological:     General: No focal deficit present.     Mental Status: She is alert.  Psychiatric:        Attention and Perception: Attention normal.        Mood and Affect: Mood is depressed.        Speech: Speech normal.        Behavior: Behavior is cooperative.        Thought Content: Thought content normal.        Cognition and Memory: Cognition normal.        Judgment: Judgment normal.     Review of Systems  Constitutional: Negative.   HENT: Negative.   Eyes: Negative.   Respiratory: Negative.   Cardiovascular: Negative.   Gastrointestinal: Negative.   Musculoskeletal: Negative.   Skin: Negative.   Neurological: Negative.   Psychiatric/Behavioral: Positive for dysphoric mood and suicidal ideas.    Blood pressure 126/87, pulse 69, temperature 98.2 F (36.8 C), temperature source Oral, resp. rate 18, height 5\' 5"  (1.651 m), weight 54.3 kg, SpO2 100 %.Body mass index is 19.92 kg/m.  General Appearance: Casual  Eye Contact:  Good  Speech:  Clear and Coherent  Volume:  Normal  Mood:  Dysphoric  Affect:  Constricted  Thought Process:  Coherent  Orientation:  Full (Time, Place, and Person)  Thought Content:  Logical  Suicidal Thoughts:  Yes.  without intent/plan  Homicidal Thoughts:  No  Memory:  Immediate;   Fair Recent;   Fair Remote;   Fair  Judgement:  Fair  Insight:  Fair  Psychomotor Activity:  Normal  Concentration:  Concentration: Fair  Recall:  [employer] of Knowledge:  Fair  Language:  Fair  Akathisia:  No  Handed:  Right  AIMS (if indicated):     Assets:  Desire for Improvement Housing Physical Health  ADL's:  Impaired  Cognition:  Impaired,  Mild  Sleep:  Number of Hours: 8     Treatment Plan Summary: Daily contact with patient to assess and evaluate symptoms and progress in treatment, Medication management and Plan I would like to think that she  may look a little bit better after just a single treatment.  Certainly no worse.  Reviewed with her plan for continued ECT next treatment tomorrow.  Also I think she seems to be more awake and alert on her current medicine regimen.  No change to any of that either.  Alethia Berthold, MD 10/09/2019, 4:17 PM

## 2019-10-10 ENCOUNTER — Inpatient Hospital Stay: Payer: Medicare Other | Admitting: Certified Registered"

## 2019-10-10 ENCOUNTER — Inpatient Hospital Stay (HOSPITAL_COMMUNITY): Admit: 2019-10-10 | Discharge: 2019-10-10 | Disposition: A | Discharge status: Home / Self Care | Payer: Medicare Other

## 2019-10-10 DIAGNOSIS — F332 Major depressive disorder, recurrent severe without psychotic features: Secondary | ICD-10-CM

## 2019-10-10 LAB — GLUCOSE, CAPILLARY: Glucose-Capillary: 93 mg/dL (ref 70–99)

## 2019-10-10 MED ORDER — SODIUM CHLORIDE 0.9 % IV SOLN: INTRAVENOUS | Status: DC | PRN

## 2019-10-10 MED ORDER — METHOHEXITAL SODIUM 100 MG/10ML IV SOSY
PREFILLED_SYRINGE | INTRAVENOUS | Status: DC | PRN
  Administered 2019-10-10: 60 mg via INTRAVENOUS

## 2019-10-10 MED ORDER — SUCCINYLCHOLINE CHLORIDE 20 MG/ML IJ SOLN
INTRAMUSCULAR | Status: DC | PRN
  Administered 2019-10-10: 80 mg via INTRAVENOUS

## 2019-10-10 NOTE — Progress Notes (Signed)
Recreation Therapy Notes  Date: 10/10/2019  Time: 9:30 am   Location: Craft room     Behavioral response: N/A   Intervention Topic: Teamwork   Discussion/Intervention: Patient did not attend group.   Clinical Observations/Feedback:  Patient did not attend group.   Aveline Daus LRT/CTRS          Pharrell Ledford 10/10/2019 11:29 AM

## 2019-10-10 NOTE — Progress Notes (Signed)
D- Patient alert and oriented. Affect/mood is pleasant and brighter today.. Pt denies SI, HI, AVH, and pain. Pt received ECT and has no complaints since then.   A- Scheduled medications administered to patient, per MD orders. Support and encouragement provided.  Routine safety checks conducted every 15 minutes.  Patient informed to notify staff with problems or concerns.  R- No adverse drug reactions noted. Patient contracts for safety at this time. Patient compliant with medications and treatment plan. Patient receptive, calm, and cooperative. Patient interacts well with others on the unit.  Patient remains safe at this time. Collier Bullock RN

## 2019-10-10 NOTE — Anesthesia Preprocedure Evaluation (Signed)
Anesthesia Evaluation  Patient identified by MRN, date of birth, ID band Patient awake    Reviewed: Allergy & Precautions, H&P , NPO status , Patient's Chart, lab work & pertinent test results, reviewed documented beta blocker date and time   Airway Mallampati: II   Neck ROM: full    Dental  (+) Poor Dentition   Pulmonary neg pulmonary ROS, Current Smoker,    Pulmonary exam normal        Cardiovascular Exercise Tolerance: Good negative cardio ROS Normal cardiovascular exam Rhythm:regular Rate:Normal     Neuro/Psych Anxiety Depression negative neurological ROS  negative psych ROS   GI/Hepatic Neg liver ROS, hiatal hernia, GERD  Medicated,  Endo/Other  negative endocrine ROS  Renal/GU negative Renal ROS  negative genitourinary   Musculoskeletal   Abdominal   Peds  Hematology  (+) Blood dyscrasia, anemia ,   Anesthesia Other Findings Past Medical History: 1997: Breast cancer ([place])     Comment:  right breast, radiation No date: Bronchitis     Comment:  recent/ 06/09/15 had [facility]/ [facility]               [facility]/resolved 1997: Cancer [facility])     Comment:  right lumpectomy,L/Ad/R  No date: GERD (gastroesophageal reflux disease) No date: History of hiatal hernia No date: Hypercholesteremia No date: Hyperlipidemia No date: Low BP     Comment:  TYPICALLY RUNS 80'S/60'S No date: Panic attack No date: Personal history of malignant neoplasm of breast Past Surgical History: 11/23/2016: BICEPT TENODESIS; Left     Comment:  Procedure: BICEPS TENODESIS;  Surgeon: Leim Fabry, MD;              Location: [facility] ORS;  Service: Orthopedics;  Laterality:               Left; 1997: BREAST EXCISIONAL BIOPSY; Right     Comment:  pos 1997: BREAST SURGERY; Right     Comment:  lumpectomy 08/06/2016: CHOLECYSTECTOMY; N/A     Comment:  Procedure: LAPAROSCOPIC CHOLECYSTECTOMY WITH               INTRAOPERATIVE CHOLANGIOGRAM;   Surgeon: Christene Lye, MD;  Location: [facility] ORS;  Service:               General;  Laterality: N/A; 2008: COLONOSCOPY 07/21/2015: COLONOSCOPY WITH PROPOFOL; N/A     Comment:  Procedure: COLONOSCOPY WITH PROPOFOL;  Surgeon: Lucilla Lame, MD;  Location: [place];  Service:               Endoscopy;  Laterality: N/A; 01/05/2018: COLONOSCOPY WITH PROPOFOL; N/A     Comment:  Procedure: COLONOSCOPY WITH PROPOFOL;  Surgeon: Jonathon Bellows, MD;  Location: [facility] ENDOSCOPY;  Service:               Gastroenterology;  Laterality: N/A; 06/16/2016: ESOPHAGOGASTRODUODENOSCOPY (EGD) WITH PROPOFOL; N/A     Comment:  Procedure: ESOPHAGOGASTRODUODENOSCOPY (EGD) WITH               PROPOFOL;  Surgeon: Christene Lye, MD;                Location: [facility] ENDOSCOPY;  Service: Endoscopy;  Laterality: N/A; 01/05/2018: ESOPHAGOGASTRODUODENOSCOPY (EGD) WITH PROPOFOL; N/A     Comment:  Procedure: ESOPHAGOGASTRODUODENOSCOPY (EGD) WITH               PROPOFOL;  Surgeon: Jonathon Bellows, MD;  Location: [facility]               ENDOSCOPY;  Service: Gastroenterology;  Laterality: N/A; 06/13/2018: ESOPHAGOGASTRODUODENOSCOPY (EGD) WITH PROPOFOL; N/A     Comment:  Procedure: ESOPHAGOGASTRODUODENOSCOPY (EGD) WITH               PROPOFOL;  Surgeon: Jonathon Bellows, MD;  Location: [facility]               ENDOSCOPY;  Service: Gastroenterology;  Laterality: N/A; 07/16/2018: ESOPHAGOGASTRODUODENOSCOPY (EGD) WITH PROPOFOL; N/A     Comment:  Procedure: ESOPHAGOGASTRODUODENOSCOPY (EGD) WITH               PROPOFOL;  Surgeon: Lucilla Lame, MD;  Location: [facility]               ENDOSCOPY;  Service: Endoscopy;  Laterality: N/A; 12/27/2016: HARDWARE REMOVAL; Left     Comment:  Procedure: HARDWARE REMOVAL LEFT  PROXIMAL HUMEROUS;                Surgeon: Leim Fabry, MD;  Location: [facility] OR[facility];  Service:              Orthopedics;  Laterality: Left; 04/21/2018: IR [place] TUBE CHANGE 8/50/2774:  NISSEN FUNDOPLICATION; N/A     Comment:  Procedure: NISSEN FUNDOPLICATION;  Surgeon: Jules Husbands, MD;  Location: [facility] OR[facility];  Service: General;                Laterality: N/A; 11/23/2016: ORIF HUMERUS FRACTURE; Left     Comment:  Procedure: OPEN REDUCTION INTERNAL FIXATION (ORIF)               PROXIMAL HUMERUS FRACTURE;  Surgeon: Leim Fabry, MD;                Location: [facility];  Service: Orthopedics;  Laterality:               Left; 03/09/2018: REPAIR OF ESOPHAGUS     Comment:  Procedure: REPAIR OF ESOPHAGUS;  Surgeon: Jules Husbands, MD;  Location: [facility];  Service: General;; 12/27/2016: REVERSE SHOULDER ARTHROPLASTY; Left     Comment:  Procedure: REVERSE SHOULDER ARTHROPLASTY;  Surgeon:               Leim Fabry, MD;  Location: [facility];  Service:               Orthopedics;  Laterality: Left; 03/09/2018: ROBOTIC ASSISTED LAPAROSCOPIC REPAIR OF PARAESOPHAGEAL  HERNIA; N/A     Comment:  Procedure: ROBOTIC HIATAL HERNIA CONVERTED TO OPEN;                Surgeon: Jules Husbands, MD;  Location: [facility] OR[facility];                Service: General;  Laterality: N/A;   Reproductive/Obstetrics negative OB ROS                             Anesthesia Physical Anesthesia Plan  ASA: III  Anesthesia Plan: General   Post-op Pain Management:    Induction:  PONV Risk Score and Plan:   Airway Management Planned:   Additional Equipment:   Intra-op Plan:   Post-operative Plan:   Informed Consent: I have reviewed the patients History and Physical, chart, labs and discussed the procedure including the risks, benefits and alternatives for the proposed anesthesia with the patient or authorized representative who has indicated his/her understanding and acceptance.     Dental Advisory Given  Plan Discussed with: CRNA  Anesthesia Plan Comments:         Anesthesia Quick Evaluation

## 2019-10-10 NOTE — Procedures (Signed)
ECT SERVICES Physician's Interval Evaluation & Treatment Note  Patient Identification: Chelsey [name] MRN:  [id number] Date of Evaluation:  10/10/2019 TX #: 2  MADRS:   MMSE:   P.E. Findings:  No change to physical exam.  Psychiatric Interval Note:  Affect slightly brighter smiling more  Subjective:  Patient is a 69 y.o. female seen for evaluation for Electroconvulsive Therapy. No new complaint  Treatment Summary:   [x]   Right Unilateral             []  Bilateral   % Energy : 0.3 ms 100%   Impedance: 2750 ohms  Seizure Energy Index: 6380 V squared  Postictal Suppression Index: 53%  Seizure Concordance Index: 27%  Medications  Pre Shock: Brevital 60 mg succinylcholine 80 mg  Post Shock: None  Seizure Duration: 6 seconds EMG 28 seconds EEG   Comments: Follow-up treatment Wednesday and then reassess for possible discharge  Lungs:  [x]   Clear to auscultation               []  Other:   Heart:    [x]   Regular rhythm             []  irregular rhythm    [x]   Previous H&P reviewed, patient examined and there are NO CHANGES                 []   Previous H&P reviewed, patient examined and there are changes noted.   Alethia Berthold, MD 9/15/20213:54 PM

## 2019-10-10 NOTE — Anesthesia Postprocedure Evaluation (Signed)
Anesthesia Post Note  Patient: Chelsey [name]  Procedure(s) Performed: ECT TX  Patient location during evaluation: PACU Anesthesia Type: General Level of consciousness: awake and alert Pain management: pain level controlled Vital Signs Assessment: post-procedure vital signs reviewed and stable Respiratory status: spontaneous breathing, nonlabored ventilation, respiratory function stable and patient connected to nasal cannula oxygen Cardiovascular status: blood pressure returned to baseline and stable Postop Assessment: no apparent nausea or vomiting Anesthetic complications: no   No complications documented.   Last Vitals:  Vitals:   10/10/19 1033 10/10/19 1043  BP: 129/84 137/76  Pulse: 75 73  Resp: 12 10  Temp:  36.5 C  SpO2: 100% 99%    Last Pain:  Vitals:   10/10/19 1043  TempSrc:   PainSc: 0-No pain                 Molli Barrows

## 2019-10-10 NOTE — Plan of Care (Signed)
Pt rates depression 6/10, hopelessness 5/10 and anxiety 6/10. Pt denies SI, HI and AVH. Pt was educated on care plan and verbalizes understanding. Pt was encouraged to attend groups. Collier Bullock RN Problem: Education: Goal: Freight forwarder Education information/materials will improve Outcome: Progressing Goal: Emotional status will improve Outcome: Progressing Goal: Mental status will improve Outcome: Progressing Goal: Verbalization of understanding the information provided will improve Outcome: Progressing   Problem: Safety: Goal: Periods of time without injury will increase Outcome: Progressing   Problem: Education: Goal: Ability to make informed decisions regarding treatment will improve Outcome: Progressing   Problem: Self-Concept: Goal: Ability to disclose and discuss suicidal ideas will improve Outcome: Progressing Goal: Will verbalize positive feelings about self Outcome: Progressing   Problem: Education: Goal: Utilization of techniques to improve thought processes will improve Outcome: Progressing Goal: Knowledge of the prescribed therapeutic regimen will improve Outcome: Progressing   Problem: Activity: Goal: Interest or engagement in leisure activities will improve Outcome: Progressing Goal: Imbalance in normal sleep/wake cycle will improve Outcome: Progressing   Problem: Coping: Goal: Coping ability will improve Outcome: Progressing Goal: Will verbalize feelings Outcome: Progressing   Problem: Health Behavior/Discharge Planning: Goal: Ability to make decisions will improve Outcome: Progressing Goal: Compliance with therapeutic regimen will improve Outcome: Progressing   Problem: Role Relationship: Goal: Will demonstrate positive changes in social behaviors and relationships Outcome: Progressing   Problem: Safety: Goal: Ability to disclose and discuss suicidal ideas will improve Outcome: Progressing Goal: Ability to identify and  utilize support systems that promote safety will improve Outcome: Progressing   Problem: Self-Concept: Goal: Will verbalize positive feelings about self Outcome: Progressing Goal: Level of anxiety will decrease Outcome: Progressing   Problem: Education: Goal: Ability to state activities that reduce stress will improve Outcome: Progressing   Problem: Coping: Goal: Ability to identify and develop effective coping behavior will improve Outcome: Progressing   Problem: Self-Concept: Goal: Ability to identify factors that promote anxiety will improve Outcome: Progressing Goal: Level of anxiety will decrease Outcome: Progressing Goal: Ability to modify response to factors that promote anxiety will improve Outcome: Progressing

## 2019-10-10 NOTE — Plan of Care (Signed)
°  Problem: Education: Goal: Mental status will improve Outcome: Progressing   Problem: Safety: Goal: Periods of time without injury will increase Outcome: Progressing   

## 2019-10-10 NOTE — Progress Notes (Signed)
Patient calm and pleasant during assessment denying SI/HI/AVH.  Patient endorses depression. Patient compliant with medication administration per MD orders. Patient aware of her ECT tomorrow and is NPO at midnight. Pt given education, support, and encouragement to be active in her treatment plan. Patient observed by this Probation officer interacting appropriately with staff and peers on the unit. Patient being monitored Q 15 minutes for safety per unit protocol. Pt remains safe on the unit.

## 2019-10-10 NOTE — Transfer of Care (Signed)
Immediate Anesthesia Transfer of Care Note  Patient: Chelsey [name]  Procedure(s) Performed: ECT TX  Patient Location: PACU  Anesthesia Type:General  Level of Consciousness: awake and drowsy  Airway & Oxygen Therapy: Patient Spontanous Breathing and Patient connected to face mask oxygen  Post-op Assessment: Report given to RN and Post -op Vital signs reviewed and stable  Post vital signs: Reviewed and stable  Last Vitals:  Vitals Value Taken Time  BP 147/95 10/10/19 1023  Temp 36.4 C 10/10/19 1023  Pulse 81 10/10/19 1023  Resp 14 10/10/19 1023  SpO2 100 % 10/10/19 1023  Vitals shown include unvalidated device data.  Last Pain:  Vitals:   10/10/19 0933  TempSrc:   PainSc: 0-No pain         Complications: No complications documented.

## 2019-10-10 NOTE — Progress Notes (Signed)
Patient remains with flat affect. Denies any SI, HI, AVH.  Night meds given and received without complications. Visible in milieu. Stayed in dayroom until daytime. Encouragement and support provided, safety checks maintained. Medications given as prescribed. Pt receptive and remains safe on unit with q 15 min checks.

## 2019-10-10 NOTE — Progress Notes (Signed)
[facility] MD Progress Note  10/10/2019 2:23 PM Chelsey [name]  MRN:  [id number] Subjective: Follow-up for 69 year old woman with major depression.  Patient had electroconvulsive therapy today for the second time.  Treatment was without any complication with an effective quality seizure.  Patient continues to say she feels down and nervous but denies active suicidal ideation.  Her affect appears brighter and her energy level appears improved. Principal Problem: Major depressive disorder, recurrent severe without psychotic features ([place]) Diagnosis: Principal Problem:   Major depressive disorder, recurrent severe without psychotic features ([place]) Active Problems:   Gastroesophageal reflux disease without esophagitis   Chronic constipation   Chronic pain syndrome  Total Time spent with patient: 30 minutes  Past Psychiatric History: Past history of severe depression and recent suicide attempt  Past Medical History:  Past Medical History:  Diagnosis Date  . Breast cancer ([place]) 1997   right breast, radiation  . Bronchitis    recent/ 06/09/15 [facility]/resolved  . Cancer [facility]) 1997   right lumpectomy,L/Ad/R   . GERD (gastroesophageal reflux disease)   . History of hiatal hernia   . Hypercholesteremia   . Hyperlipidemia   . Low BP    TYPICALLY RUNS 80'S/60'S  . Panic attack   . Personal history of malignant neoplasm of breast     Past Surgical History:  Procedure Laterality Date  . BICEPT TENODESIS Left 11/23/2016   Procedure: BICEPS TENODESIS;  Surgeon: Leim Fabry, MD;  Location: [facility] ORS;  Service: Orthopedics;  Laterality: Left;  . BREAST EXCISIONAL BIOPSY Right 1997   pos  . BREAST SURGERY Right 1997   lumpectomy  . CHOLECYSTECTOMY N/A 08/06/2016   Procedure: LAPAROSCOPIC CHOLECYSTECTOMY WITH INTRAOPERATIVE CHOLANGIOGRAM;  Surgeon: Christene Lye, MD;  Location: [facility];  Service: General;  Laterality: N/A;  . COLONOSCOPY  2008  . COLONOSCOPY WITH  PROPOFOL N/A 07/21/2015   Procedure: COLONOSCOPY WITH PROPOFOL;  Surgeon: Lucilla Lame, MD;  Location: [place];  Service: Endoscopy;  Laterality: N/A;  . COLONOSCOPY WITH PROPOFOL N/A 01/05/2018   Procedure: COLONOSCOPY WITH PROPOFOL;  Surgeon: Jonathon Bellows, MD;  Location[facility] ENDOSCOPY;  Service: Gastroenterology;  Laterality: N/A;  . ESOPHAGOGASTRODUODENOSCOPY (EGD) WITH PROPOFOL N/A 06/16/2016   Procedure: ESOPHAGOGASTRODUODENOSCOPY (EGD) WITH PROPOFOL;  Surgeon: Christene Lye, MD;  Location: [facility] ENDOSCOPY;  Service: Endoscopy;  Laterality: N/A;  . ESOPHAGOGASTRODUODENOSCOPY (EGD) WITH PROPOFOL N/A 01/05/2018   Procedure: ESOPHAGOGASTRODUODENOSCOPY (EGD) WITH PROPOFOL;  Surgeon: Jonathon Bellows, MD;  Location: [facility] ENDOSCOPY;  Service: Gastroenterology;  Laterality: N/A;  . ESOPHAGOGASTRODUODENOSCOPY (EGD) WITH PROPOFOL N/A 06/13/2018   Procedure: ESOPHAGOGASTRODUODENOSCOPY (EGD) WITH PROPOFOL;  Surgeon: Jonathon Bellows, MD;  Location: [facility] ENDOSCOPY;  Service: Gastroenterology;  Laterality: N/A;  . ESOPHAGOGASTRODUODENOSCOPY (EGD) WITH PROPOFOL N/A 07/16/2018   Procedure: ESOPHAGOGASTRODUODENOSCOPY (EGD) WITH PROPOFOL;  Surgeon: Lucilla Lame, MD;  Location: [facility] ENDOSCOPY;  Service: Endoscopy;  Laterality: N/A;  . HARDWARE REMOVAL Left 12/27/2016   Procedure: HARDWARE REMOVAL LEFT  PROXIMAL HUMEROUS;  Surgeon: Leim Fabry, MD;  Location: [facility];  Service: Orthopedics;  Laterality: Left;  . IR GJ TUBE CHANGE  04/21/2018  . NISSEN FUNDOPLICATION N/A 1/49/7026   Procedure: NISSEN FUNDOPLICATION;  Surgeon: Jules Husbands, MD;  Location: [facility];  Service: General;  Laterality: N/A;  . ORIF HUMERUS FRACTURE Left 11/23/2016   Procedure: OPEN REDUCTION INTERNAL FIXATION (ORIF) PROXIMAL HUMERUS FRACTURE;  Surgeon: Leim Fabry, MD;  Location: [facility];  Service: Orthopedics;  Laterality: Left;  . REPAIR OF ESOPHAGUS  03/09/2018  Procedure: REPAIR OF ESOPHAGUS;  Surgeon: Jules Husbands, MD;   Location: [facility];  Service: General;;  . REVERSE SHOULDER ARTHROPLASTY Left 12/27/2016   Procedure: REVERSE SHOULDER ARTHROPLASTY;  Surgeon: Leim Fabry, MD;  Location: [facility];  Service: Orthopedics;  Laterality: Left;  . ROBOTIC ASSISTED LAPAROSCOPIC REPAIR OF PARAESOPHAGEAL HERNIA N/A 03/09/2018   Procedure: ROBOTIC HIATAL HERNIA CONVERTED TO OPEN;  Surgeon: Jules Husbands, MD;  Location: [facility];  Service: General;  Laterality: N/A;   Family History:  Family History  Problem Relation Age of Onset  . Anxiety disorder Brother   . Obesity Brother   . COPD Brother   . Kidney disease Mother   . Heart attack Father   . Hypertension Father   . Alcohol abuse Father   . Depression Brother   . Anxiety disorder Brother   . Breast cancer Maternal Grandmother    Family Psychiatric  History: See previous Social History:  Social History   Substance and Sexual Activity  Alcohol Use No  . Alcohol/week: 0.0 standard drinks     Social History   Substance and Sexual Activity  Drug Use No    Social History   Socioeconomic History  . Marital status: Widowed    Spouse name: Not on file  . Number of children: 2  . Years of education: Not on file  . Highest education level: Bachelor's degree (e.g., BA, AB, BS)  Occupational History  . Not on file  Tobacco Use  . Smoking status: Current Some Day Smoker    Packs/day: 0.10    Years: 10.00    Pack years: 1.00    Types: Cigarettes    Start date: 11/25/2009  . Smokeless tobacco: Never Used  . Tobacco comment: 1 cigarette daily  Vaping Use  . Vaping Use: Never used  Substance and Sexual Activity  . Alcohol use: No    Alcohol/week: 0.0 standard drinks  . Drug use: No  . Sexual activity: Never  Other Topics Concern  . Not on file  Social History Narrative  . Not on file   Social Determinants of Health   Financial Resource Strain:   . Difficulty of Paying Living Expenses: Not on file  Food Insecurity:   . Worried About  Charity fundraiser in the Last Year: Not on file  . Ran Out of Food in the Last Year: Not on file  Transportation Needs:   . Lack of Transportation (Medical): Not on file  . Lack of Transportation (Non-Medical): Not on file  Physical Activity:   . Days of Exercise per Week: Not on file  . Minutes of Exercise per Session: Not on file  Stress:   . Feeling of Stress : Not on file  Social Connections:   . Frequency of Communication with Friends and Family: Not on file  . Frequency of Social Gatherings with Friends and Family: Not on file  . Attends Religious Services: Not on file  . Active Member of Clubs or Organizations: Not on file  . Attends Archivist Meetings: Not on file  . Marital Status: Not on file   Additional Social History:                         Sleep: Fair  Appetite:  Fair  Current Medications: Current Facility-Administered Medications  Medication Dose Route Frequency Provider Last Rate Last Admin  . acetaminophen (TYLENOL) tablet 650 mg  650 mg Oral Q6H PRN Lord,  Asa Saunas, NP   650 mg at 10/08/19 1335  . alum & mag hydroxide-simeth (MAALOX/MYLANTA) 200-200-20 MG/5ML suspension 30 mL  30 mL Oral Q4H PRN Patrecia Pour, NP   30 mL at 10/08/19 2235  . buPROPion (WELLBUTRIN XL) 24 hr tablet 300 mg  300 mg Oral Daily Jabril Pursell T, MD   300 mg at 10/10/19 1105  . busPIRone (BUSPAR) tablet 10 mg  10 mg Oral TID Hakeem Frazzini, Madie Reno, MD   10 mg at 10/10/19 1107  . docusate sodium (COLACE) capsule 100 mg  100 mg Oral BID Robert Sperl T, MD   100 mg at 10/10/19 1105  . ibuprofen (ADVIL) tablet 600 mg  600 mg Oral Q6H PRN Yovan Leeman, Madie Reno, MD   600 mg at 10/10/19 1106  . linaclotide (LINZESS) capsule 72 mcg  72 mcg Oral Q0600 Hampton Abbot, MD   72 mcg at 10/09/19 0610  . magnesium hydroxide (MILK OF MAGNESIA) suspension 30 mL  30 mL Oral Daily PRN Patrecia Pour, NP   30 mL at 10/09/19 1220  . OLANZapine (ZYPREXA) tablet 5 mg  5 mg Oral QHS Christy Ehrsam, Madie Reno, MD   5 mg at 10/09/19 2116  . pantoprazole (PROTONIX) EC tablet 40 mg  40 mg Oral Daily Patrecia Pour, NP   40 mg at 10/10/19 0813  . polyethylene glycol (MIRALAX / GLYCOLAX) packet 17 g  17 g Oral Daily Patrecia Pour, NP   17 g at 10/10/19 1108  . senna-docusate (Senokot-S) tablet 2 tablet  2 tablet Oral BID PRN Leonela Kivi T, MD      . traZODone (DESYREL) tablet 100 mg  100 mg Oral QHS Cecila Satcher, Madie Reno, MD   100 mg at 10/09/19 2116    Lab Results:  Results for orders placed or performed during the hospital encounter of 09/25/19 (from the past 48 hour(s))  Glucose, capillary     Status: None   Collection Time: 10/10/19  6:13 AM  Result Value Ref Range   Glucose-Capillary 93 70 - 99 mg/dL    Comment: Glucose reference range applies only to samples taken after fasting for at least 8 hours.    Blood Alcohol level:  Lab Results  Component Value Date   ETH <10 09/24/2019   ETH <10 10/62/6948    Metabolic Disorder Labs: No results found for: HGBA1C, MPG No results found for: PROLACTIN Lab Results  Component Value Date   CHOL 164 10/02/2019   TRIG 117 10/02/2019   HDL 52 10/02/2019   CHOLHDL 3.2 10/02/2019   VLDL 23 10/02/2019   LDLCALC 89 10/02/2019   LDLCALC 87 07/27/2017    Physical Findings: AIMS:  , ,  ,  ,    CIWA:    COWS:     Musculoskeletal: Strength & Muscle Tone: within normal limits Gait & Station: normal Patient leans: N/A  Psychiatric Specialty Exam: Physical Exam Vitals and nursing note reviewed.  Constitutional:      Appearance: She is well-developed.  HENT:     Head: Normocephalic and atraumatic.  Eyes:     Conjunctiva/sclera: Conjunctivae normal.     Pupils: Pupils are equal, round, and reactive to light.  Cardiovascular:     Heart sounds: Normal heart sounds.  Pulmonary:     Effort: Pulmonary effort is normal.  Abdominal:     Palpations: Abdomen is soft.  Musculoskeletal:        General: Normal range of motion.  Cervical back:  Normal range of motion.  Skin:    General: Skin is warm and dry.  Neurological:     General: No focal deficit present.     Mental Status: She is alert.  Psychiatric:        Attention and Perception: Attention normal.        Mood and Affect: Mood is depressed.        Speech: Speech normal.        Behavior: Behavior is cooperative.        Thought Content: Thought content normal.        Cognition and Memory: Memory is impaired.        Judgment: Judgment normal.     Review of Systems  Constitutional: Negative.   HENT: Negative.   Eyes: Negative.   Respiratory: Negative.   Cardiovascular: Negative.   Gastrointestinal: Negative.   Musculoskeletal: Negative.   Skin: Negative.   Neurological: Negative.   Psychiatric/Behavioral: Positive for dysphoric mood. Negative for suicidal ideas.    Blood pressure 112/83, pulse 75, temperature 98 F (36.7 C), temperature source Oral, resp. rate 17, height 5\' 5"  (1.651 m), weight 54.3 kg, SpO2 100 %.Body mass index is 19.92 kg/m.  General Appearance: Casual  Eye Contact:  Fair  Speech:  Slow  Volume:  Decreased  Mood:  Dysphoric  Affect:  Constricted  Thought Process:  Goal Directed  Orientation:  Full (Time, Place, and Person)  Thought Content:  Logical  Suicidal Thoughts:  No  Homicidal Thoughts:  No  Memory:  Immediate;   Fair Recent;   Fair Remote;   Fair  Judgement:  Fair  Insight:  Fair  Psychomotor Activity:  Normal  Concentration:  Concentration: Fair  Recall:  [employer] of Knowledge:  Fair  Language:  Fair  Akathisia:  No  Handed:  Right  AIMS (if indicated):     Assets:  Desire for Improvement Housing Resilience  ADL's:  Intact  Cognition:  Impaired,  Mild  Sleep:  Number of Hours: 7.75     Treatment Plan Summary: Daily contact with patient to assess and evaluate symptoms and progress in treatment, Medication management and Plan I am optimistic that she may be showing some early improvement from  electroconvulsive therapy.  Also tolerating medicine well.  I think we are hoping to aim for possible discharge by the end of the week although it would be ideal if we could do further outpatient treatment.  Patient understands plan.  No change to medicine for today  Alethia Berthold, MD 10/10/2019, 2:23 PM

## 2019-10-10 NOTE — H&P (Signed)
Chelsey [name] is an 69 y.o. female.   Chief Complaint: ongoing depression. No psychosis HPI: recurrent severe depression with suicide attempt  Past Medical History:  Diagnosis Date  . Breast cancer ([place]) 1997   right breast, radiation  . Bronchitis    recent/ 06/09/15 had [facility] x[facility]/ [facility]/resolved  . Cancer [facility]) 1997   right lumpectomy,L/Ad/R   . GERD (gastroesophageal reflux disease)   . History of hiatal hernia   . Hypercholesteremia   . Hyperlipidemia   . Low BP    TYPICALLY RUNS 80'S/60'S  . Panic attack   . Personal history of malignant neoplasm of breast     Past Surgical History:  Procedure Laterality Date  . BICEPT TENODESIS Left 11/23/2016   Procedure: BICEPS TENODESIS;  Surgeon: Leim Fabry, MD;  Location: [facility];  Service: Orthopedics;  Laterality: Left;  . BREAST EXCISIONAL BIOPSY Right 1997   pos  . BREAST SURGERY Right 1997   lumpectomy  . CHOLECYSTECTOMY N/A 08/06/2016   Procedure: LAPAROSCOPIC CHOLECYSTECTOMY WITH INTRAOPERATIVE CHOLANGIOGRAM;  Surgeon: Christene Lye, MD;  Location: [facility];  Service: General;  Laterality: N/A;  . COLONOSCOPY  2008  . COLONOSCOPY WITH PROPOFOL N/A 07/21/2015   Procedure: COLONOSCOPY WITH PROPOFOL;  Surgeon: Lucilla Lame, MD;  Location: [place];  Service: Endoscopy;  Laterality: N/A;  . COLONOSCOPY WITH PROPOFOL N/A 01/05/2018   Procedure: COLONOSCOPY WITH PROPOFOL;  Surgeon: Jonathon Bellows, MD;  Location: [facility] ENDOSCOPY;  Service: Gastroenterology;  Laterality: N/A;  . ESOPHAGOGASTRODUODENOSCOPY (EGD) WITH PROPOFOL N/A 06/16/2016   Procedure: ESOPHAGOGASTRODUODENOSCOPY (EGD) WITH PROPOFOL;  Surgeon: Christene Lye, MD;  Location: [facility] ENDOSCOPY;  Service: Endoscopy;  Laterality: N/A;  . ESOPHAGOGASTRODUODENOSCOPY (EGD) WITH PROPOFOL N/A 01/05/2018   Procedure: ESOPHAGOGASTRODUODENOSCOPY (EGD) WITH PROPOFOL;  Surgeon: Jonathon Bellows, MD;  Location: [facility];  Service:  Gastroenterology;  Laterality: N/A;  . ESOPHAGOGASTRODUODENOSCOPY (EGD) WITH PROPOFOL N/A 06/13/2018   Procedure: ESOPHAGOGASTRODUODENOSCOPY (EGD) WITH PROPOFOL;  Surgeon: Jonathon Bellows, MD;  Location: [facility] ENDOSCOPY;  Service: Gastroenterology;  Laterality: N/A;  . ESOPHAGOGASTRODUODENOSCOPY (EGD) WITH PROPOFOL N/A 07/16/2018   Procedure: ESOPHAGOGASTRODUODENOSCOPY (EGD) WITH PROPOFOL;  Surgeon: Lucilla Lame, MD;  Location: [facility] ENDOSCOPY;  Service: Endoscopy;  Laterality: N/A;  . HARDWARE REMOVAL Left 12/27/2016   Procedure: HARDWARE REMOVAL LEFT  PROXIMAL HUMEROUS;  Surgeon: Leim Fabry, MD;  Location: [facility];  Service: Orthopedics;  Laterality: Left;  . IR GJ TUBE CHANGE  04/21/2018  . NISSEN FUNDOPLICATION N/A 04/17/5571   Procedure: NISSEN FUNDOPLICATION;  Surgeon: Jules Husbands, MD;  Location: [facility];  Service: General;  Laterality: N/A;  . ORIF HUMERUS FRACTURE Left 11/23/2016   Procedure: OPEN REDUCTION INTERNAL FIXATION (ORIF) PROXIMAL HUMERUS FRACTURE;  Surgeon: Leim Fabry, MD;  Location: [facility];  Service: Orthopedics;  Laterality: Left;  . REPAIR OF ESOPHAGUS  03/09/2018   Procedure: REPAIR OF ESOPHAGUS;  Surgeon: Jules Husbands, MD;  Location: [facility];  Service: General;;  . REVERSE SHOULDER ARTHROPLASTY Left 12/27/2016   Procedure: REVERSE SHOULDER ARTHROPLASTY;  Surgeon: Leim Fabry, MD;  Location: [facility];  Service: Orthopedics;  Laterality: Left;  . ROBOTIC ASSISTED LAPAROSCOPIC REPAIR OF PARAESOPHAGEAL HERNIA N/A 03/09/2018   Procedure: ROBOTIC HIATAL HERNIA CONVERTED TO OPEN;  Surgeon: Jules Husbands, MD;  Location: [facility];  Service: General;  Laterality: N/A;    Family History  Problem Relation Age of Onset  . Anxiety disorder Brother   . Obesity Brother   . COPD Brother   . Kidney disease Mother   .  Heart attack Father   . Hypertension Father   . Alcohol abuse Father   . Depression Brother   . Anxiety disorder Brother   . Breast cancer Maternal Grandmother     Social History:  reports that she has been smoking cigarettes. She started smoking about 9 years ago. She has a 1.00 pack-year smoking history. She has never used smokeless tobacco. She reports that she does not drink alcohol and does not use drugs.  Allergies: No Known Allergies  (Not in a hospital admission)   No results found for this or any previous visit (from the past 48 hour(s)). No results found.  Review of Systems  Constitutional: Negative.   HENT: Negative.   Eyes: Negative.   Respiratory: Negative.   Cardiovascular: Negative.   Gastrointestinal: Negative.   Musculoskeletal: Negative.   Skin: Negative.   Neurological: Negative.   Psychiatric/Behavioral: Positive for dysphoric mood. Negative for suicidal ideas.    Blood pressure 120/76, pulse 70, temperature 97.8 F (36.6 C), temperature source Oral, resp. rate 18, SpO2 99 %. Physical Exam Vitals and nursing note reviewed.  Constitutional:      Appearance: She is well-developed.  HENT:     Head: Normocephalic and atraumatic.  Eyes:     Conjunctiva/sclera: Conjunctivae normal.     Pupils: Pupils are equal, round, and reactive to light.  Cardiovascular:     Heart sounds: Normal heart sounds.  Pulmonary:     Effort: Pulmonary effort is normal.  Abdominal:     Palpations: Abdomen is soft.  Musculoskeletal:        General: Normal range of motion.     Cervical back: Normal range of motion.  Skin:    General: Skin is warm and dry.  Neurological:     Mental Status: She is alert.  Psychiatric:        Attention and Perception: Attention normal.        Mood and Affect: Mood is depressed.        Behavior: Behavior is slowed.        Thought Content: Thought content is not paranoid. Thought content does not include homicidal or suicidal ideation.        Cognition and Memory: Cognition is not impaired.        Judgment: Judgment normal.      Assessment/Plan Treatment number 2  Alethia Berthold, MD 10/10/2019,  10:02 AM

## 2019-10-11 ENCOUNTER — Other Ambulatory Visit: Payer: Self-pay | Admitting: Psychiatry

## 2019-10-11 NOTE — Progress Notes (Signed)
BRIEF PHARMACY NOTE   This patient attended and participated in Medication Management Group counseling led by [facility] staff pharmacist.  This interactive class reviews basic information about prescription medications and education on personal responsibility in medication management.  The class also includes general knowledge of 3 main classes of behavioral medications, including antipsychotics, antidepressants, and mood stabilizers.     Patient behavior was appropriate for group setting.   Educational materials sourced from:  "Medication Do's and Don'ts" from [email or web address]   "Mental Health Medications" from [email or web address]#part [place], PharmD, BCPS Clinical Pharmacist 10/11/2019 3:23 PM

## 2019-10-11 NOTE — Progress Notes (Signed)
[facility] MD Progress Note  10/11/2019 4:53 PM Chelsey [name]  MRN:  [id number] Subjective: Follow-up for this patient with major depression receiving ECT.  Patient acknowledges her mood is improved.  No active suicidal thoughts.  No clear confusion.  No new physical issues.  Still does not sleep very well at night but better than she did before Principal Problem: Major depressive disorder, recurrent severe without psychotic features ([place]) Diagnosis: Principal Problem:   Major depressive disorder, recurrent severe without psychotic features ([place]) Active Problems:   Gastroesophageal reflux disease without esophagitis   Chronic constipation   Chronic pain syndrome  Total Time spent with patient: 30 minutes  Past Psychiatric History: Past history of recurrent depression  Past Medical History:  Past Medical History:  Diagnosis Date  . Breast cancer ([place]) 1997   right breast, radiation  . Bronchitis    recent/ 06/09/15 had chest xray/ [facility]/resolved  . Cancer [facility]) 1997   right lumpectomy,L/Ad/R   . GERD (gastroesophageal reflux disease)   . History of hiatal hernia   . Hypercholesteremia   . Hyperlipidemia   . Low BP    TYPICALLY RUNS 80'S/60'S  . Panic attack   . Personal history of malignant neoplasm of breast     Past Surgical History:  Procedure Laterality Date  . BICEPT TENODESIS Left 11/23/2016   Procedure: BICEPS TENODESIS;  Surgeon: Leim Fabry, MD;  Location: [facility] ORS;  Service: Orthopedics;  Laterality: Left;  . BREAST EXCISIONAL BIOPSY Right 1997   pos  . BREAST SURGERY Right 1997   lumpectomy  . CHOLECYSTECTOMY N/A 08/06/2016   Procedure: LAPAROSCOPIC CHOLECYSTECTOMY WITH INTRAOPERATIVE CHOLANGIOGRAM;  Surgeon: Christene Lye, MD;  Location: [facility] ORS;  Service: General;  Laterality: N/A;  . COLONOSCOPY  2008  . COLONOSCOPY WITH PROPOFOL N/A 07/21/2015   Procedure: COLONOSCOPY WITH PROPOFOL;  Surgeon: Lucilla Lame, MD;  Location: [place];   Service: Endoscopy;  Laterality: N/A;  . COLONOSCOPY WITH PROPOFOL N/A 01/05/2018   Procedure: COLONOSCOPY WITH PROPOFOL;  Surgeon: Jonathon Bellows, MD;  Location: [facility] ENDOSCOPY;  Service: Gastroenterology;  Laterality: N/A;  . ESOPHAGOGASTRODUODENOSCOPY (EGD) WITH PROPOFOL N/A 06/16/2016   Procedure: ESOPHAGOGASTRODUODENOSCOPY (EGD) WITH PROPOFOL;  Surgeon: Christene Lye, MD;  Location: [facility] ENDOSCOPY;  Service: Endoscopy;  Laterality: N/A;  . ESOPHAGOGASTRODUODENOSCOPY (EGD) WITH PROPOFOL N/A 01/05/2018   Procedure: ESOPHAGOGASTRODUODENOSCOPY (EGD) WITH PROPOFOL;  Surgeon: Jonathon Bellows, MD;  Location: [facility]OSCOPY;  Service: Gastroenterology;  Laterality: N/A;  . ESOPHAGOGASTRODUODENOSCOPY (EGD) WITH PROPOFOL N/A 06/13/2018   Procedure: ESOPHAGOGASTRODUODENOSCOPY (EGD) WITH PROPOFOL;  Surgeon: Jonathon Bellows, MD;  Location: [facility] ENDOSCOPY;  Service: Gastroenterology;  Laterality: N/A;  . ESOPHAGOGASTRODUODENOSCOPY (EGD) WITH PROPOFOL N/A 07/16/2018   Procedure: ESOPHAGOGASTRODUODENOSCOPY (EGD) WITH PROPOFOL;  Surgeon: Lucilla Lame, MD;  Location: [facility] ENDOSCOPY;  Service: Endoscopy;  Laterality: N/A;  . HARDWARE REMOVAL Left 12/27/2016   Procedure: HARDWARE REMOVAL LEFT  PROXIMAL HUMEROUS;  Surgeon: Leim Fabry, MD;  Location: [facility] ORS;  Service: Orthopedics;  Laterality: Left;  . IR GJ TUBE CHANGE  04/21/2018  . NISSEN FUNDOPLICATION N/A 0/93/2671   Procedure: NISSEN FUNDOPLICATION;  Surgeon: Jules Husbands, MD;  Location: [facility] ORS;  Service: General;  Laterality: N/A;  . ORIF HUMERUS FRACTURE Left 11/23/2016   Procedure: OPEN REDUCTION INTERNAL FIXATION (ORIF) PROXIMAL HUMERUS FRACTURE;  Surgeon: Leim Fabry, MD;  Location: [facility] ORS;  Service: Orthopedics;  Laterality: Left;  . REPAIR OF ESOPHAGUS  03/09/2018   Procedure: REPAIR OF ESOPHAGUS;  Surgeon: Jules Husbands, MD;  Location: [facility] ORS;  Service: General;;  . REVERSE SHOULDER ARTHROPLASTY Left 12/27/2016   Procedure: REVERSE SHOULDER  ARTHROPLASTY;  Surgeon: Leim Fabry, MD;  Location: [facility] ORS;  Service: Orthopedics;  Laterality: Left;  . ROBOTIC ASSISTED LAPAROSCOPIC REPAIR OF PARAESOPHAGEAL HERNIA N/A 03/09/2018   Procedure: ROBOTIC HIATAL HERNIA CONVERTED TO OPEN;  Surgeon: Jules Husbands, MD;  Location: [facility] ORS;  Service: General;  Laterality: N/A;   Family History:  Family History  Problem Relation Age of Onset  . Anxiety disorder Brother   . Obesity Brother   . COPD Brother   . Kidney disease Mother   . Heart attack Father   . Hypertension Father   . Alcohol abuse Father   . Depression Brother   . Anxiety disorder Brother   . Breast cancer Maternal Grandmother    Family Psychiatric  History: Family history of depression Social History:  Social History   Substance and Sexual Activity  Alcohol Use No  . Alcohol/week: 0.0 standard drinks     Social History   Substance and Sexual Activity  Drug Use No    Social History   Socioeconomic History  . Marital status: Widowed    Spouse name: Not on file  . Number of children: 2  . Years of education: Not on file  . Highest education level: Bachelor's degree (e.g., BA, AB, BS)  Occupational History  . Not on file  Tobacco Use  . Smoking status: Current Some Day Smoker    Packs/day: 0.10    Years: 10.00    Pack years: 1.00    Types: Cigarettes    Start date: 11/25/2009  . Smokeless tobacco: Never Used  . Tobacco comment: 1 cigarette daily  Vaping Use  . Vaping Use: Never used  Substance and Sexual Activity  . Alcohol use: No    Alcohol/week: 0.0 standard drinks  . Drug use: No  . Sexual activity: Never  Other Topics Concern  . Not on file  Social History Narrative  . Not on file   Social Determinants of Health   Financial Resource Strain:   . Difficulty of Paying Living Expenses: Not on file  Food Insecurity:   . Worried About Charity fundraiser in the Last Year: Not on file  . Ran Out of Food in the Last Year: Not on file   Transportation Needs:   . Lack of Transportation (Medical): Not on file  . Lack of Transportation (Non-Medical): Not on file  Physical Activity:   . Days of Exercise per Week: Not on file  . Minutes of Exercise per Session: Not on file  Stress:   . Feeling of Stress : Not on file  Social Connections:   . Frequency of Communication with Friends and Family: Not on file  . Frequency of Social Gatherings with Friends and Family: Not on file  . Attends Religious Services: Not on file  . Active Member of Clubs or Organizations: Not on file  . Attends Archivist Meetings: Not on file  . Marital Status: Not on file   Additional Social History:                         Sleep: Fair  Appetite:  Fair  Current Medications: Current Facility-Administered Medications  Medication Dose Route Frequency Provider Last Rate Last Admin  . acetaminophen (TYLENOL) tablet 650 mg  650 mg Oral Q6H PRN Patrecia Pour, NP   650 mg at 10/08/19  1335  . alum & mag hydroxide-simeth (MAALOX/MYLANTA) 200-200-20 MG/5ML suspension 30 mL  30 mL Oral Q4H PRN Patrecia Pour, NP   30 mL at 10/10/19 2219  . buPROPion (WELLBUTRIN XL) 24 hr tablet 300 mg  300 mg Oral Daily Mace Weinberg, Madie Reno, MD   300 mg at 10/11/19 0805  . busPIRone (BUSPAR) tablet 10 mg  10 mg Oral TID Jeremaine Maraj, Madie Reno, MD   10 mg at 10/11/19 1215  . docusate sodium (COLACE) capsule 100 mg  100 mg Oral BID Aryanne Gilleland, Madie Reno, MD   100 mg at 10/11/19 0806  . ibuprofen (ADVIL) tablet 600 mg  600 mg Oral Q6H PRN Karra Pink, Madie Reno, MD   600 mg at 10/10/19 1106  . linaclotide (LINZESS) capsule 72 mcg  72 mcg Oral Q0600 Hampton Abbot, MD   72 mcg at 10/11/19 0622  . magnesium hydroxide (MILK OF MAGNESIA) suspension 30 mL  30 mL Oral Daily PRN Patrecia Pour, NP   30 mL at 10/09/19 1220  . OLANZapine (ZYPREXA) tablet 5 mg  5 mg Oral QHS Ahan Eisenberger, Madie Reno, MD   5 mg at 10/10/19 2106  . pantoprazole (PROTONIX) EC tablet 40 mg  40 mg Oral Daily Patrecia Pour, NP   40 mg at 10/11/19 0805  . polyethylene glycol (MIRALAX / GLYCOLAX) packet 17 g  17 g Oral Daily Patrecia Pour, NP   17 g at 10/11/19 0805  . senna-docusate (Senokot-S) tablet 2 tablet  2 tablet Oral BID PRN Cristo Ausburn T, MD      . traZODone (DESYREL) tablet 100 mg  100 mg Oral QHS Rigby Swamy, Madie Reno, MD   100 mg at 10/10/19 2106    Lab Results:  Results for orders placed or performed during the hospital encounter of 09/25/19 (from the past 48 hour(s))  Glucose, capillary     Status: None   Collection Time: 10/10/19  6:13 AM  Result Value Ref Range   Glucose-Capillary 93 70 - 99 mg/dL    Comment: Glucose reference range applies only to samples taken after fasting for at least 8 hours.    Blood Alcohol level:  Lab Results  Component Value Date   ETH <10 09/24/2019   ETH <10 32/95/1884    Metabolic Disorder Labs: No results found for: HGBA1C, MPG No results found for: PROLACTIN Lab Results  Component Value Date   CHOL 164 10/02/2019   TRIG 117 10/02/2019   HDL 52 10/02/2019   CHOLHDL 3.2 10/02/2019   VLDL 23 10/02/2019   LDLCALC 89 10/02/2019   LDLCALC 87 07/27/2017    Physical Findings: AIMS:  , ,  ,  ,    CIWA:    COWS:     Musculoskeletal: Strength & Muscle Tone: within normal limits Gait & Station: normal Patient leans: N/A  Psychiatric Specialty Exam: Physical Exam Vitals and nursing note reviewed.  Constitutional:      Appearance: She is well-developed.  HENT:     Head: Normocephalic and atraumatic.  Eyes:     Conjunctiva/sclera: Conjunctivae normal.     Pupils: Pupils are equal, round, and reactive to light.  Cardiovascular:     Heart sounds: Normal heart sounds.  Pulmonary:     Effort: Pulmonary effort is normal.  Abdominal:     Palpations: Abdomen is soft.  Musculoskeletal:        General: Normal range of motion.     Cervical back: Normal range of motion.  Skin:  General: Skin is warm and dry.  Neurological:     General: No  focal deficit present.     Mental Status: She is alert.  Psychiatric:        Attention and Perception: Attention normal.        Mood and Affect: Mood is anxious.        Speech: Speech normal.        Behavior: Behavior normal.        Thought Content: Thought content normal.        Cognition and Memory: Cognition normal.        Judgment: Judgment normal.     Review of Systems  Constitutional: Negative.   HENT: Negative.   Eyes: Negative.   Respiratory: Negative.   Cardiovascular: Negative.   Gastrointestinal: Negative.   Musculoskeletal: Negative.   Skin: Negative.   Neurological: Negative.   Psychiatric/Behavioral: Positive for dysphoric mood. Negative for suicidal ideas.    Blood pressure 93/61, pulse 79, temperature 98.3 F (36.8 C), temperature source Oral, resp. rate 17, height 5\' 5"  (1.651 m), weight 54.3 kg, SpO2 100 %.Body mass index is 19.92 kg/m.  General Appearance: Casual  Eye Contact:  Good  Speech:  Clear and Coherent  Volume:  Decreased  Mood:  Dysphoric  Affect:  Congruent  Thought Process:  Coherent  Orientation:  Full (Time, Place, and Person)  Thought Content:  Logical  Suicidal Thoughts:  No  Homicidal Thoughts:  No  Memory:  Immediate;   Fair Recent;   Fair Remote;   Fair  Judgement:  Fair  Insight:  Fair  Psychomotor Activity:  Decreased  Concentration:  Concentration: Fair  Recall:  [employer] of Knowledge:  Fair  Language:  Fair  Akathisia:  No  Handed:  Right  AIMS (if indicated):     Assets:  Desire for Improvement Housing Resilience  ADL's:  Intact  Cognition:  Impaired,  Mild  Sleep:  Number of Hours: 7     Treatment Plan Summary: Daily contact with patient to assess and evaluate symptoms and progress in treatment, Medication management and Plan Review of medication and treatment plan.  ECT tomorrow for treatment #3.  We are looking forward to possibly discharge this weekend.  Alethia Berthold, MD 10/11/2019, 4:53 PM

## 2019-10-11 NOTE — Plan of Care (Signed)
Patient is appropriate in the unit.Patient thinks that her depression is getting better.Patient states " it is going to be different when I get home with my alcoholic son." Patient denies SI,HI and AVH.Compliant with medications.Appetite and energy level good.Attended groups.Support and encouragement given.

## 2019-10-11 NOTE — Tx Team (Signed)
Interdisciplinary Treatment and Diagnostic Plan Update  10/11/2019 Time of Session: 8:30 AM  Chelsey [name] MRN: [id number]  Principal Diagnosis: Major depressive disorder, recurrent severe without psychotic features ([place])  Secondary Diagnoses: Principal Problem:   Major depressive disorder, recurrent severe without psychotic features ([place]) Active Problems:   Gastroesophageal reflux disease without esophagitis   Chronic constipation   Chronic pain syndrome   Current Medications:  Current Facility-Administered Medications  Medication Dose Route Frequency Provider Last Rate Last Admin  . acetaminophen (TYLENOL) tablet 650 mg  650 mg Oral Q6H PRN Patrecia Pour, NP   650 mg at 10/08/19 1335  . alum & mag hydroxide-simeth (MAALOX/MYLANTA) 200-200-20 MG/5ML suspension 30 mL  30 mL Oral Q4H PRN Patrecia Pour, NP   30 mL at 10/10/19 2219  . buPROPion (WELLBUTRIN XL) 24 hr tablet 300 mg  300 mg Oral Daily Clapacs, Madie Reno, MD   300 mg at 10/11/19 0805  . busPIRone (BUSPAR) tablet 10 mg  10 mg Oral TID Clapacs, Madie Reno, MD   10 mg at 10/11/19 1215  . docusate sodium (COLACE) capsule 100 mg  100 mg Oral BID Clapacs, Madie Reno, MD   100 mg at 10/11/19 0806  . ibuprofen (ADVIL) tablet 600 mg  600 mg Oral Q6H PRN Clapacs, Madie Reno, MD   600 mg at 10/10/19 1106  . linaclotide (LINZESS) capsule 72 mcg  72 mcg Oral Q0600 Hampton Abbot, MD   72 mcg at 10/11/19 0622  . magnesium hydroxide (MILK OF MAGNESIA) suspension 30 mL  30 mL Oral Daily PRN Patrecia Pour, NP   30 mL at 10/09/19 1220  . OLANZapine (ZYPREXA) tablet 5 mg  5 mg Oral QHS Clapacs, Madie Reno, MD   5 mg at 10/10/19 2106  . pantoprazole (PROTONIX) EC tablet 40 mg  40 mg Oral Daily Patrecia Pour, NP   40 mg at 10/11/19 0805  . polyethylene glycol (MIRALAX / GLYCOLAX) packet 17 g  17 g Oral Daily Patrecia Pour, NP   17 g at 10/11/19 0805  . senna-docusate (Senokot-S) tablet 2 tablet  2 tablet Oral BID PRN Clapacs, John T, MD      . traZODone  (DESYREL) tablet 100 mg  100 mg Oral QHS Clapacs, Madie Reno, MD   100 mg at 10/10/19 2106   PTA Medications: Medications Prior to Admission  Medication Sig Dispense Refill Last Dose  . bisacodyl (DULCOLAX) 5 MG EC tablet Take 1 tablet (5 mg total) by mouth daily. (Patient not taking: Reported on 09/24/2019) 30 tablet 0   . busPIRone (BUSPAR) 10 MG tablet Take 1 tablet (10 mg total) by mouth 2 (two) times daily. (Patient not taking: Reported on 09/24/2019) 180 tablet 1   . linaclotide (LINZESS) 72 MCG capsule Take 1 capsule (72 mcg total) by mouth daily before breakfast. 30 capsule 2   . omeprazole (PRILOSEC) 20 MG capsule Take 1 capsule (20 mg total) by mouth 2 (two) times daily before a meal. 180 capsule 3   . PARoxetine (PAXIL) 20 MG tablet Take 1 tablet (20 mg total) by mouth daily before supper. 90 tablet 1   . polyethylene glycol (MIRALAX / GLYCOLAX) 17 g packet Take 17 g by mouth daily. 14 each 0   . Simethicone (GAS-X PO) Take 1-2 tablets by mouth daily as needed (gas).     . simvastatin (ZOCOR) 20 MG tablet TAKE ONE TABLET BY MOUTH AT BEDTIME (Patient not taking: Reported on 07/12/2019) 90 tablet  1   . traZODone (DESYREL) 100 MG tablet Take 1 tablet (100 mg total) by mouth at bedtime. 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   Physician Treatment Plan for Primary Diagnosis: Major depressive disorder, recurrent severe without psychotic features ([place]) Long Term Goal(s):     Short Term Goals:    Medication Management: Evaluate patient's response, side effects, and tolerance of medication regimen.  Therapeutic Interventions: 1 to 1 sessions, Unit Group sessions and Medication administration.  Evaluation of  Outcomes: Progressing  Physician Treatment Plan for Secondary Diagnosis: Principal Problem:   Major depressive disorder, recurrent severe without psychotic features ([place]) Active Problems:   Gastroesophageal reflux disease without esophagitis   Chronic constipation   Chronic pain syndrome  Long Term Goal(s):     Short Term Goals:       Medication Management: Evaluate patient's response, side effects, and tolerance of medication regimen.  Therapeutic Interventions: 1 to 1 sessions, Unit Group sessions and Medication administration.  Evaluation of Outcomes: Progressing   RN Treatment Plan for Primary Diagnosis: Major depressive disorder, recurrent severe without psychotic features ([place]) Long Term Goal(s): Knowledge of disease and therapeutic regimen to maintain health will improve  Short Term Goals: Ability to demonstrate self-control, Ability to participate in decision making will improve, Ability to verbalize feelings will improve, Ability to disclose and discuss suicidal ideas, Ability to identify and develop effective coping behaviors will improve and Compliance with prescribed medications will improve  Medication Management: RN will administer medications as ordered by provider, will assess and evaluate patient's response and provide education to patient for prescribed medication. RN will report any adverse and/or side effects to prescribing provider.  Therapeutic Interventions: 1 on 1 counseling sessions, Psychoeducation, Medication administration, Evaluate responses to treatment, Monitor vital signs and CBGs as ordered, Perform/monitor CIWA, COWS, AIMS and Fall Risk screenings as ordered, Perform wound care treatments as ordered.  Evaluation of Outcomes: Progressing   LCSW Treatment Plan for Primary Diagnosis: Major depressive disorder, recurrent severe without psychotic features ([place]) Long Term Goal(s): Safe transition to appropriate next level of care at discharge, Engage  patient in therapeutic group addressing interpersonal concerns.  Short Term Goals: Engage patient in aftercare planning with referrals and resources, Increase social support, Increase ability to appropriately verbalize feelings, Increase emotional regulation, Facilitate acceptance of mental health diagnosis and concerns, Identify triggers associated with mental health/substance abuse issues and Increase skills for wellness and recovery  Therapeutic Interventions: Assess for all discharge needs, 1 to 1 time with Social worker, Explore available resources and support systems, Assess for adequacy in community support network, Educate family and significant other(s) on suicide prevention, Complete Psychosocial Assessment, Interpersonal group therapy.  Evaluation of Outcomes: Progressing   Progress in Treatment: Attending groups: Yes. Participating in groups: Yes. Taking medication as prescribed: Yes. Toleration medication: Yes. Family/Significant other contact made: Yes, individual(s) contacted:  SPE completed with patients son  Patient understands diagnosis: Yes. Discussing patient identified problems/goals with staff: Yes. Medical problems stabilized or resolved: Yes. Denies suicidal/homicidal ideation: Yes. Issues/concerns per patient self-inventory: No. Other: N/a  New problem(s) identified: No, Describe:  None   New Short Term/Long Term Goal(s): medication management for mood stabilization; elimination of SI thoughts; development of comprehensive mental wellness/sobriety plan.  Updates 10/02/2019:  No changes at this time.  Update 10/06/19 No changes at this time.  Update 10/11/19:  No changes at this time.   Patient Goals:  "get my medication right, stop hurting all day and get some energy" Updates 10/02/2019:  No changes at this time. Update 10/06/19: Patient stated that she would like to know how to deal with living with a son who is an alcoholic and is not ready to receive help. Patient stated  she was interested in long-term assisted living  Update 10/11/19:  No changes at this time  Discharge Plan or Barriers: Patient reports plans to return to her home. She has been hesitant at this time to agree to aftercare plans. Updates 10/02/2019:  No changes at this time. Update 10/06/19- Patient is interested in long-term assisted living. Update 10/11/19:  Patient has befin ECT and has begun to make some improvements.    Reason for Continuation of Hospitalization: Anxiety Depression Medication stabilization Suicidal ideation  Estimated Length of Stay: 1-7 days   Attendees: Patient: 10/11/2019 1:06 PM  Physician: Dr. Weber Cooks, MD 10/11/2019 1:06 PM  Nursing:  10/11/2019 1:06 PM  RN Care Manager: 10/11/2019 1:06 PM  Social Worker: Assunta Curtis, Meta 10/11/2019 1:06 PM  Recreational Therapist:  10/11/2019 1:06 PM  Other:  10/11/2019 1:06 PM  Other:  10/11/2019 1:06 PM  Other: 10/11/2019 1:06 PM    Scribe for Treatment Team: Rozann Lesches, LCSW 10/11/2019 1:06 PM

## 2019-10-11 NOTE — [facility] Group Notes (Signed)
[facility] Group Notes:  (Nursing/MHT/Case Management/Adjunct)  Date:  10/11/2019  Time:  9:03 PM  Type of Therapy:  Group Therapy  Participation Level:  Active  Participation Quality:  Supportive  Affect:  Appropriate  Cognitive:  Alert  Insight:  Good  Engagement in Group:  Engaged and her goal is to talk to doctor about medicine.  Modes of Intervention:  Support  Summary of Progress/Problems:  Chelsey [name] 10/11/2019, 9:03 PM

## 2019-10-11 NOTE — Progress Notes (Signed)
Recreation Therapy Notes  Date: 10/11/2019  Time: 9:30 am   Location: Craft room  Behavioral response: Appropriate  Intervention Topic: Communication   Discussion/Intervention:  Group content today was focused on communication. The group defined communication and ways to communicate with others. Individuals stated reason why communication is important and some reasons to communicate with others. Patients expressed if they thought they were good at communicating with others and ways they could improve their communication skills. The group identified important parts of communication and some experiences they have had in the past with communication. The group participated in the intervention "What is that?", where they had a chance to test out their communication skills and identify ways to improve their communication techniques.   Clinical Observations/Feedback: Patient came to group and defined communication as actions and reactions. Individual was social with peers and staff while participating in the intervention.  Jaylenne Hamelin LRT/CTRS         Kendyll Huettner 10/11/2019 12:24 PM

## 2019-10-12 ENCOUNTER — Encounter: Payer: Self-pay | Admitting: Psychiatry

## 2019-10-12 ENCOUNTER — Inpatient Hospital Stay: Payer: Medicare Other | Admitting: Anesthesiology

## 2019-10-12 ENCOUNTER — Other Ambulatory Visit: Payer: Self-pay | Admitting: Psychiatry

## 2019-10-12 LAB — GLUCOSE, CAPILLARY: Glucose-Capillary: 81 mg/dL (ref 70–99)

## 2019-10-12 MED ORDER — OLANZAPINE 5 MG PO TABS
5.0000 mg | ORAL_TABLET | Freq: Every day | ORAL | 1 refills | Status: DC
Start: 2019-10-12 — End: 2020-01-21

## 2019-10-12 MED ORDER — KETAMINE HCL 10 MG/ML IJ SOLN
INTRAMUSCULAR | Status: DC | PRN
  Administered 2019-10-12: 80 mg via INTRAVENOUS

## 2019-10-12 MED ORDER — LINACLOTIDE 72 MCG PO CAPS
72.0000 ug | ORAL_CAPSULE | Freq: Every day | ORAL | 1 refills | Status: DC
Start: 2019-10-13 — End: 2020-01-21

## 2019-10-12 MED ORDER — POLYETHYLENE GLYCOL 3350 17 G PO PACK
17.0000 g | PACK | Freq: Every day | ORAL | 1 refills | Status: DC
Start: 2019-10-13 — End: 2021-12-23

## 2019-10-12 MED ORDER — SUCCINYLCHOLINE CHLORIDE 20 MG/ML IJ SOLN
INTRAMUSCULAR | Status: DC | PRN
  Administered 2019-10-12: 80 mg via INTRAVENOUS

## 2019-10-12 MED ORDER — SODIUM CHLORIDE 0.9 % IV SOLN: 500.0000 mL | Freq: Once | INTRAVENOUS | Status: DC

## 2019-10-12 MED ORDER — PANTOPRAZOLE SODIUM 40 MG PO TBEC
40.0000 mg | DELAYED_RELEASE_TABLET | Freq: Every day | ORAL | 1 refills | Status: DC
Start: 2019-10-13 — End: 2020-01-21

## 2019-10-12 MED ORDER — BUPROPION HCL ER (XL) 300 MG PO TB24
300.0000 mg | ORAL_TABLET | Freq: Every day | ORAL | 1 refills | Status: DC
Start: 2019-10-13 — End: 2020-01-21

## 2019-10-12 MED ORDER — DOCUSATE SODIUM 100 MG PO CAPS
100.0000 mg | ORAL_CAPSULE | Freq: Two times a day (BID) | ORAL | 1 refills | Status: DC
Start: 2019-10-13 — End: 2020-01-21

## 2019-10-12 MED ORDER — TRAZODONE HCL 100 MG PO TABS
100.0000 mg | ORAL_TABLET | Freq: Every day | ORAL | 1 refills | Status: DC
Start: 2019-10-12 — End: 2020-01-21

## 2019-10-12 MED ORDER — BUSPIRONE HCL 10 MG PO TABS
10.0000 mg | ORAL_TABLET | Freq: Three times a day (TID) | ORAL | 1 refills | Status: DC
Start: 2019-10-13 — End: 2020-01-21

## 2019-10-12 MED ORDER — SODIUM CHLORIDE 0.9 % IV SOLN
500.0000 mL | Freq: Once | INTRAVENOUS | Status: AC
  Administered 2019-10-12: 500 mL via INTRAVENOUS

## 2019-10-12 NOTE — Progress Notes (Signed)
D: Pt alert and oriented. Pt rates depression 6/10, hopelessness 5/10, and anxiety 5/10.Pt goal: "get procedure done - ask MD questions." Pt reports energy level as low and concentration as being good. Pt reports sleep last night as being poor. Pt did not receive medications for sleep. Pt reports experiencing 4/10 Left shoulder pain, prn meds could not be given d/t having ECT. Pt denies experiencing any SI/HI, or AVH at this time.   A: Scheduled medications administered to pt, per MD orders. Support and encouragement provided. Frequent verbal contact made. Routine safety checks conducted q15 minutes.   R: No adverse drug reactions noted. Pt verbally contracts for safety at this time. Pt complaint with medications and treatment plan. Pt interacts well with others on the unit. Pt remains safe at this time. Will continue to monitor.

## 2019-10-12 NOTE — Progress Notes (Signed)
[facility] MD Progress Note  10/12/2019 6:41 PM LATRICIA CERRITO  MRN:  [id number] Subjective: Follow-up patient with depression.  ECT today tolerated well.  Mood continues to show improvement.  No suicidal ideation or hopelessness.  Physically feeling better.  Able to review the plan effectively for discharge Principal Problem: Major depressive disorder, recurrent severe without psychotic features ([place]) Diagnosis: Principal Problem:   Major depressive disorder, recurrent severe without psychotic features ([place]) Active Problems:   Gastroesophageal reflux disease without esophagitis   Chronic constipation   Chronic pain syndrome  Total Time spent with patient: 30 minutes  Past Psychiatric History: Past history of recurrent severe depression recent suicide attempt  Past Medical History:  Past Medical History:  Diagnosis Date  . Breast cancer ([place]) 1997   right breast, radiation  . Bronchitis    recent/ 06/09/15 [facility]/resolved  . Cancer [facility]) 1997   right lumpectomy,L/Ad/R   . GERD (gastroesophageal reflux disease)   . History of hiatal hernia   . Hypercholesteremia   . Hyperlipidemia   . Low BP    TYPICALLY RUNS 80'S/60'S  . Panic attack   . Personal history of malignant neoplasm of breast     Past Surgical History:  Procedure Laterality Date  . BICEPT TENODESIS Left 11/23/2016   Procedure: BICEPS TENODESIS;  Surgeon: Leim Fabry, MD;  Location: [facility];  Service: Orthopedics;  Laterality: Left;  . BREAST EXCISIONAL BIOPSY Right 1997   pos  . BREAST SURGERY Right 1997   lumpectomy  . CHOLECYSTECTOMY N/A 08/06/2016   Procedure: LAPAROSCOPIC CHOLECYSTECTOMY WITH INTRAOPERATIVE CHOLANGIOGRAM;  Surgeon: Christene Lye, MD;  Location: [facility];  Service: General;  Laterality: N/A;  . COLONOSCOPY  2008  . COLONOSCOPY WITH PROPOFOL N/A 07/21/2015   Procedure: COLONOSCOPY WITH PROPOFOL;  Surgeon: Lucilla Lame, MD;  Location: [place];   Service: Endoscopy;  Laterality: N/A;  . COLONOSCOPY WITH PROPOFOL N/A 01/05/2018   Procedure: COLONOSCOPY WITH PROPOFOL;  Surgeon: Jonathon Bellows, MD;  Location: [facility] ENDOSCOPY;  Service: Gastroenterology;  Laterality: N/A;  . ESOPHAGOGASTRODUODENOSCOPY (EGD) WITH PROPOFOL N/A 06/16/2016   Procedure: ESOPHAGOGASTRODUODENOSCOPY (EGD) WITH PROPOFOL;  Surgeon: Christene Lye, MD;  Location: [facility] ENDOSCOPY;  Service: Endoscopy;  Laterality: N/A;  . ESOPHAGOGASTRODUODENOSCOPY (EGD) WITH PROPOFOL N/A 01/05/2018   Procedure: ESOPHAGOGASTRODUODENOSCOPY (EGD) WITH PROPOFOL;  Surgeon: Jonathon Bellows, MD;  Location: [facility] ENDOSCOPY;  Service: Gastroenterology;  Laterality: N/A;  . ESOPHAGOGASTRODUODENOSCOPY (EGD) WITH PROPOFOL N/A 06/13/2018   Procedure: ESOPHAGOGASTRODUODENOSCOPY (EGD) WITH PROPOFOL;  Surgeon: Jonathon Bellows, MD;  Location: [facility] ENDOSCOPY;  Service: Gastroenterology;  Laterality: N/A;  . ESOPHAGOGASTRODUODENOSCOPY (EGD) WITH PROPOFOL N/A 07/16/2018   Procedure: ESOPHAGOGASTRODUODENOSCOPY (EGD) WITH PROPOFOL;  Surgeon: Lucilla Lame, MD;  Location: [facility] ENDOSCOPY;  Service: Endoscopy;  Laterality: N/A;  . HARDWARE REMOVAL Left 12/27/2016   Procedure: HARDWARE REMOVAL LEFT  PROXIMAL HUMEROUS;  Surgeon: Leim Fabry, MD;  Location: [facility];  Service: Orthopedics;  Laterality: Left;  . IR [place] TUBE CHANGE  04/21/2018  . NISSEN FUNDOPLICATION N/A 05/13/6220   Procedure: NISSEN FUNDOPLICATION;  Surgeon: Jules Husbands, MD;  Location: [facility];  Service: General;  Laterality: N/A;  . ORIF HUMERUS FRACTURE Left 11/23/2016   Procedure: OPEN REDUCTION INTERNAL FIXATION (ORIF) PROXIMAL HUMERUS FRACTURE;  Surgeon: Leim Fabry, MD;  Location: [facility];  Service: Orthopedics;  Laterality: Left;  . REPAIR OF ESOPHAGUS  03/09/2018   Procedure: REPAIR OF ESOPHAGUS;  Surgeon: Jules Husbands, MD;  Location: [facility];  Service: General;;  .  REVERSE SHOULDER ARTHROPLASTY Left 12/27/2016   Procedure: REVERSE SHOULDER  ARTHROPLASTY;  Surgeon: Leim Fabry, MD;  Location: [facility];  Service: Orthopedics;  Laterality: Left;  . ROBOTIC ASSISTED LAPAROSCOPIC REPAIR OF PARAESOPHAGEAL HERNIA N/A 03/09/2018   Procedure: ROBOTIC HIATAL HERNIA CONVERTED TO OPEN;  Surgeon: Jules Husbands, MD;  Location: [facility];  Service: General;  Laterality: N/A;   Family History:  Family History  Problem Relation Age of Onset  . Anxiety disorder Brother   . Obesity Brother   . COPD Brother   . Kidney disease Mother   . Heart attack Father   . Hypertension Father   . Alcohol abuse Father   . Depression Brother   . Anxiety disorder Brother   . Breast cancer Maternal Grandmother    Family Psychiatric  History: See previous Social History:  Social History   Substance and Sexual Activity  Alcohol Use No  . Alcohol/week: 0.0 standard drinks     Social History   Substance and Sexual Activity  Drug Use No    Social History   Socioeconomic History  . Marital status: Widowed    Spouse name: Not on file  . Number of children: 2  . Years of education: Not on file  . Highest education level: Bachelor's degree (e.g., BA, AB, BS)  Occupational History  . Not on file  Tobacco Use  . Smoking status: Current Some Day Smoker    Packs/day: 0.10    Years: 10.00    Pack years: 1.00    Types: Cigarettes    Start date: 11/25/2009  . Smokeless tobacco: Never Used  . Tobacco comment: 1 cigarette daily  Vaping Use  . Vaping Use: Never used  Substance and Sexual Activity  . Alcohol use: No    Alcohol/week: 0.0 standard drinks  . Drug use: No  . Sexual activity: Never  Other Topics Concern  . Not on file  Social History Narrative  . Not on file   Social Determinants of Health   Financial Resource Strain:   . Difficulty of Paying Living Expenses: Not on file  Food Insecurity:   . Worried About Charity fundraiser in the Last Year: Not on file  . Ran Out of Food in the Last Year: Not on file  Transportation Needs:    . Lack of Transportation (Medical): Not on file  . Lack of Transportation (Non-Medical): Not on file  Physical Activity:   . Days of Exercise per Week: Not on file  . Minutes of Exercise per Session: Not on file  Stress:   . Feeling of Stress : Not on file  Social Connections:   . Frequency of Communication with Friends and Family: Not on file  . Frequency of Social Gatherings with Friends and Family: Not on file  . Attends Religious Services: Not on file  . Active Member of Clubs or Organizations: Not on file  . Attends Archivist Meetings: Not on file  . Marital Status: Not on file   Additional Social History:                         Sleep: Fair  Appetite:  Fair  Current Medications: Current Facility-Administered Medications  Medication Dose Route Frequency Provider Last Rate Last Admin  . 0.9 %  sodium chloride infusion  500 mL Intravenous Once Jarelyn Bambach T, MD      . 0.9 %  sodium chloride infusion  500 mL Intravenous  Once Jarae Nemmers, Madie Reno, MD      . acetaminophen (TYLENOL) tablet 650 mg  650 mg Oral Q6H PRN Patrecia Pour, NP   650 mg at 10/08/19 1335  . alum & mag hydroxide-simeth (MAALOX/MYLANTA) 200-200-20 MG/5ML suspension 30 mL  30 mL Oral Q4H PRN Patrecia Pour, NP   30 mL at 10/10/19 2219  . buPROPion (WELLBUTRIN XL) 24 hr tablet 300 mg  300 mg Oral Daily Jacorion Klem T, MD   300 mg at 10/12/19 1111  . busPIRone (BUSPAR) tablet 10 mg  10 mg Oral TID Maryagnes Carrasco, Madie Reno, MD   10 mg at 10/12/19 1707  . docusate sodium (COLACE) capsule 100 mg  100 mg Oral BID Senaya Dicenso, Madie Reno, MD   100 mg at 10/12/19 1703  . ibuprofen (ADVIL) tablet 600 mg  600 mg Oral Q6H PRN Ishmeal Rorie, Madie Reno, MD   600 mg at 10/10/19 1106  . linaclotide (LINZESS) capsule 72 mcg  72 mcg Oral Q0600 Hampton Abbot, MD   72 mcg at 10/12/19 1217  . magnesium hydroxide (MILK OF MAGNESIA) suspension 30 mL  30 mL Oral Daily PRN Patrecia Pour, NP   30 mL at 10/09/19 1220  . OLANZapine  (ZYPREXA) tablet 5 mg  5 mg Oral QHS Tayvien Kane, Madie Reno, MD   5 mg at 10/11/19 2114  . pantoprazole (PROTONIX) EC tablet 40 mg  40 mg Oral Daily Patrecia Pour, NP   40 mg at 10/12/19 0750  . polyethylene glycol (MIRALAX / GLYCOLAX) packet 17 g  17 g Oral Daily Patrecia Pour, NP   17 g at 10/12/19 1112  . senna-docusate (Senokot-S) tablet 2 tablet  2 tablet Oral BID PRN Patsye Sullivant T, MD      . traZODone (DESYREL) tablet 100 mg  100 mg Oral QHS Amaurie Wandel, Madie Reno, MD   100 mg at 10/11/19 2114    Lab Results:  Results for orders placed or performed during the hospital encounter of 09/25/19 (from the past 48 hour(s))  Glucose, capillary     Status: None   Collection Time: 10/12/19  6:20 AM  Result Value Ref Range   Glucose-Capillary 81 70 - 99 mg/dL    Comment: Glucose reference range applies only to samples taken after fasting for at least 8 hours.    Blood Alcohol level:  Lab Results  Component Value Date   ETH <10 09/24/2019   ETH <10 15/40/0867    Metabolic Disorder Labs: No results found for: HGBA1C, MPG No results found for: PROLACTIN Lab Results  Component Value Date   CHOL 164 10/02/2019   TRIG 117 10/02/2019   HDL 52 10/02/2019   CHOLHDL 3.2 10/02/2019   VLDL 23 10/02/2019   LDLCALC 89 10/02/2019   LDLCALC 87 07/27/2017    Physical Findings: AIMS:  , ,  ,  ,    CIWA:    COWS:     Musculoskeletal: Strength & Muscle Tone: within normal limits Gait & Station: normal Patient leans: N/A  Psychiatric Specialty Exam: Physical Exam Vitals and nursing note reviewed.  Constitutional:      Appearance: She is well-developed.  HENT:     Head: Normocephalic and atraumatic.  Eyes:     Conjunctiva/sclera: Conjunctivae normal.     Pupils: Pupils are equal, round, and reactive to light.  Cardiovascular:     Heart sounds: Normal heart sounds.  Pulmonary:     Effort: Pulmonary effort is normal.  Abdominal:  Palpations: Abdomen is soft.  Musculoskeletal:         General: Normal range of motion.     Cervical back: Normal range of motion.  Skin:    General: Skin is warm and dry.  Neurological:     General: No focal deficit present.     Mental Status: She is alert.  Psychiatric:        Mood and Affect: Mood normal.     Review of Systems  Constitutional: Negative.   HENT: Negative.   Eyes: Negative.   Respiratory: Negative.   Cardiovascular: Negative.   Gastrointestinal: Negative.   Musculoskeletal: Negative.   Skin: Negative.   Neurological: Negative.   Psychiatric/Behavioral: Negative.     Blood pressure 92/63, pulse 93, temperature (!) 97 F (36.1 C), resp. rate 18, height 5\' 5"  (1.651 m), weight 54.3 kg, SpO2 100 %.Body mass index is 19.92 kg/m.  General Appearance: Casual  Eye Contact:  Good  Speech:  Clear and Coherent  Volume:  Normal  Mood:  Euthymic  Affect:  Congruent  Thought Process:  Goal Directed  Orientation:  Full (Time, Place, and Person)  Thought Content:  Logical  Suicidal Thoughts:  No  Homicidal Thoughts:  No  Memory:  Immediate;   Fair Recent;   Fair Remote;   Fair  Judgement:  Fair  Insight:  Fair  Psychomotor Activity:  Normal  Concentration:  Concentration: Fair  Recall:  [employer] of Knowledge:  Fair  Language:  Fair  Akathisia:  No  Handed:  Right  AIMS (if indicated):     Assets:  Desire for Improvement Physical Health Resilience  ADL's:  Intact  Cognition:  WNL  Sleep:  Number of Hours: 7.25     Treatment Plan Summary: Plan Continue current medication.  Plan is for discharge on Sunday with continued ECT next week  Alethia Berthold, MD 10/12/2019, 6:41 PM

## 2019-10-12 NOTE — Discharge Instructions (Signed)
NEW PATIENT INSTRUCTIONS FOR ECT  -Patient should plan to be at the hospital for at least 4 hours              You will need a driver to bring you to and from treatment                     They should be no further than 30 minutes away Driver will need to be within 30 minutes of discharge to retrieve patient  Patient: -Nothing to eat or drink after midnight - Take blood pressure medication and Prilosec/ Nexium/ Protonix - No anti-anxiety medication after 5 pm the night before treatment (Ativan, Klonopin, Valium, Lamictal, or Xanax) -No insulin the morning of treatment -Register at Admitting the morning of treatment (bring a photo ID and insurance card) -Call [phone] before 7am if you need to cancel or reschedule

## 2019-10-12 NOTE — Progress Notes (Signed)
Recreation Therapy Notes   Date: 10/12/2019  Time: 9:30 am   Location: Craft room     Behavioral response: N/A   Intervention Topic: Self-Care   Discussion/Intervention: Patient did not attend group.   Clinical Observations/Feedback:  Patient did not attend group.   Sieanna Vanstone LRT/CTRS        Larra Crunkleton 10/12/2019 11:23 AM

## 2019-10-12 NOTE — Anesthesia Preprocedure Evaluation (Signed)
Anesthesia Evaluation  Patient identified by MRN, date of birth, ID band Patient awake    Reviewed: Allergy & Precautions, H&P , NPO status , Patient's Chart, lab work & pertinent test results, reviewed documented beta blocker date and time   Airway Mallampati: II   Neck ROM: full    Dental  (+) Poor Dentition, Chipped   Pulmonary neg pulmonary ROS, Current Smoker,    Pulmonary exam normal        Cardiovascular Exercise Tolerance: Good negative cardio ROS Normal cardiovascular exam Rhythm:regular Rate:Normal     Neuro/Psych PSYCHIATRIC DISORDERS Anxiety Depression negative neurological ROS     GI/Hepatic Neg liver ROS, hiatal hernia, GERD  Medicated,  Endo/Other  negative endocrine ROS  Renal/GU negative Renal ROS  negative genitourinary   Musculoskeletal   Abdominal   Peds  Hematology  (+) Blood dyscrasia, anemia ,   Anesthesia Other Findings Past Medical History: 1997: Breast cancer ([place])     Comment:  right breast, radiation No date: Bronchitis     Comment:  recent/ 06/09/15 had [facility]/ [facility]               [facility]/resolved 1997: Cancer [facility])     Comment:  right lumpectomy,L/Ad/R  No date: GERD (gastroesophageal reflux disease) No date: History of hiatal hernia No date: Hypercholesteremia No date: Hyperlipidemia No date: Low BP     Comment:  TYPICALLY RUNS 80'S/60'S No date: Panic attack No date: Personal history of malignant neoplasm of breast Past Surgical History: 11/23/2016: BICEPT TENODESIS; Left     Comment:  Procedure: BICEPS TENODESIS;  Surgeon: Leim Fabry, MD;              Location: [facility];  Service: Orthopedics;  Laterality:               Left; 1997: BREAST EXCISIONAL BIOPSY; Right     Comment:  pos 1997: BREAST SURGERY; Right     Comment:  lumpectomy 08/06/2016: CHOLECYSTECTOMY; N/A     Comment:  Procedure: LAPAROSCOPIC CHOLECYSTECTOMY WITH               INTRAOPERATIVE  CHOLANGIOGRAM;  Surgeon: Christene Lye, MD;  Location: [facility];  Service:               General;  Laterality: N/A; 2008: COLONOSCOPY 07/21/2015: COLONOSCOPY WITH PROPOFOL; N/A     Comment:  Procedure: COLONOSCOPY WITH PROPOFOL;  Surgeon: Lucilla Lame, MD;  Location: [place];  Service:               Endoscopy;  Laterality: N/A; 01/05/2018: COLONOSCOPY WITH PROPOFOL; N/A     Comment:  Procedure: COLONOSCOPY WITH PROPOFOL;  Surgeon: Jonathon Bellows, MD;  Location: [facility] ENDOSCOPY;  Service:               Gastroenterology;  Laterality: N/A; 06/16/2016: ESOPHAGOGASTRODUODENOSCOPY (EGD) WITH PROPOFOL; N/A     Comment:  Procedure: ESOPHAGOGASTRODUODENOSCOPY (EGD) WITH               PROPOFOL;  Surgeon: Christene Lye, MD;                Location: [facility] ENDOSCOPY;  Service: Endoscopy;  Laterality: N/A; 01/05/2018: ESOPHAGOGASTRODUODENOSCOPY (EGD) WITH PROPOFOL; N/A     Comment:  Procedure: ESOPHAGOGASTRODUODENOSCOPY (EGD) WITH               PROPOFOL;  Surgeon: Jonathon Bellows, MD;  Location: [facility]               ENDOSCOPY;  Service: Gastroenterology;  Laterality: N/A; 06/13/2018: ESOPHAGOGASTRODUODENOSCOPY (EGD) WITH PROPOFOL; N/A     Comment:  Procedure: ESOPHAGOGASTRODUODENOSCOPY (EGD) WITH               PROPOFOL;  Surgeon: Jonathon Bellows, MD;  Location: [facility]               ENDOSCOPY;  Service: Gastroenterology;  Laterality: N/A; 07/16/2018: ESOPHAGOGASTRODUODENOSCOPY (EGD) WITH PROPOFOL; N/A     Comment:  Procedure: ESOPHAGOGASTRODUODENOSCOPY (EGD) WITH               PROPOFOL;  Surgeon: Lucilla Lame, MD;  Location: [facility]               ENDOSCOPY;  Service: Endoscopy;  Laterality: N/A; 12/27/2016: HARDWARE REMOVAL; Left     Comment:  Procedure: HARDWARE REMOVAL LEFT  PROXIMAL HUMEROUS;                Surgeon: Leim Fabry, MD;  Location: [facility];  Service:              Orthopedics;  Laterality: Left; 04/21/2018: IR [place] TUBE  CHANGE 4/69/6295: NISSEN FUNDOPLICATION; N/A     Comment:  Procedure: NISSEN FUNDOPLICATION;  Surgeon: Jules Husbands, MD;  Location: [facility];  Service: General;                Laterality: N/A; 11/23/2016: ORIF HUMERUS FRACTURE; Left     Comment:  Procedure: OPEN REDUCTION INTERNAL FIXATION (ORIF)               PROXIMAL HUMERUS FRACTURE;  Surgeon: Leim Fabry, MD;                Location: [facility];  Service: Orthopedics;  Laterality:               Left; 03/09/2018: REPAIR OF ESOPHAGUS     Comment:  Procedure: REPAIR OF ESOPHAGUS;  Surgeon: Jules Husbands, MD;  Location: [facility];  Service: General;; 12/27/2016: REVERSE SHOULDER ARTHROPLASTY; Left     Comment:  Procedure: REVERSE SHOULDER ARTHROPLASTY;  Surgeon:               Leim Fabry, MD;  Location: [facility];  Service:               Orthopedics;  Laterality: Left; 03/09/2018: ROBOTIC ASSISTED LAPAROSCOPIC REPAIR OF PARAESOPHAGEAL  HERNIA; N/A     Comment:  Procedure: ROBOTIC HIATAL HERNIA CONVERTED TO OPEN;                Surgeon: Jules Husbands, MD;  Location: [facility];                Service: General;  Laterality: N/A;   Reproductive/Obstetrics negative OB ROS                             Anesthesia Physical  Anesthesia Plan  ASA: III  Anesthesia Plan: General   Post-op Pain Management:  Induction: Intravenous  PONV Risk Score and Plan:   Airway Management Planned: Mask  Additional Equipment:   Intra-op Plan:   Post-operative Plan:   Informed Consent: I have reviewed the patients History and Physical, chart, labs and discussed the procedure including the risks, benefits and alternatives for the proposed anesthesia with the patient or authorized representative who has indicated his/her understanding and acceptance.     Dental Advisory Given  Plan Discussed with: CRNA  Anesthesia Plan Comments: (Patient consented for risks of anesthesia including but not  limited to:  - adverse reactions to medications - risk of intubation if required - damage to eyes, teeth, lips or other oral mucosa - nerve damage due to positioning  - sore throat or hoarseness - Damage to heart, brain, nerves, lungs, other parts of body or loss of life  Patient voiced understanding.)        Anesthesia Quick Evaluation

## 2019-10-12 NOTE — Procedures (Signed)
ECT SERVICES Physician's Interval Evaluation & Treatment Note  Patient Identification: Chelsey [name] MRN:  [id number] Date of Evaluation:  10/12/2019 TX #: 3  MADRS:   MMSE:   P.E. Findings:  No change to physical exam  Psychiatric Interval Note:  Mood slightly improved  Subjective:  Patient is a 69 y.o. female seen for evaluation for Electroconvulsive Therapy. Mild depression still  Treatment Summary:   [x]   Right Unilateral             []  Bilateral   % Energy : 0.3 ms 100%   Impedance: 1860 ohms  Seizure Energy Index: 7190 V squared  Postictal Suppression Index: 71%  Seizure Concordance Index: 86%  Medications  Pre Shock: Ketamine 80 mg succinylcholine 80 mg  Post Shock:    Seizure Duration: 10 seconds EMG 20 seconds EEG   Comments: Next treatment Monday possibly as an outpatient  Lungs:  [x]   Clear to auscultation               []  Other:   Heart:    [x]   Regular rhythm             []  irregular rhythm    [x]   Previous H&P reviewed, patient examined and there are NO CHANGES                 []   Previous H&P reviewed, patient examined and there are changes noted.   Alethia Berthold, MD 9/17/20216:30 PM

## 2019-10-12 NOTE — [facility] Suicide Risk Assessment (Signed)
[facility] Discharge Suicide Risk Assessment   Principal Problem: Major depressive disorder, recurrent severe without psychotic features ([place]) Discharge Diagnoses: Principal Problem:   Major depressive disorder, recurrent severe without psychotic features ([place]) Active Problems:   Gastroesophageal reflux disease without esophagitis   Chronic constipation   Chronic pain syndrome   Total Time spent with patient: 30 minutes  Musculoskeletal: Strength & Muscle Tone: within normal limits Gait & Station: normal Patient leans: Right  Psychiatric Specialty Exam: Review of Systems  Constitutional: Negative.   HENT: Negative.   Eyes: Negative.   Respiratory: Negative.   Cardiovascular: Negative.   Gastrointestinal: Negative.   Musculoskeletal: Negative.   Skin: Negative.   Neurological: Negative.   Psychiatric/Behavioral: Negative.     Blood pressure 92/63, pulse 93, temperature (!) 97 F (36.1 C), resp. rate 18, height 5\' 5"  (1.651 m), weight 54.3 kg, SpO2 100 %.Body mass index is 19.92 kg/m.  General Appearance: Casual  Eye Contact::  Good  Speech:  Clear and [id number]  Volume:  Normal  Mood:  Euthymic  Affect:  Congruent  Thought Process:  Goal Directed  Orientation:  Full (Time, Place, and Person)  Thought Content:  Logical  Suicidal Thoughts:  No  Homicidal Thoughts:  No  Memory:  Immediate;   Fair Recent;   Fair Remote;   Fair  Judgement:  Fair  Insight:  Fair  Psychomotor Activity:  Normal  Concentration:  Fair  Recall:  [employer] of Knowledge:Fair  Language: Fair  Akathisia:  No  Handed:  Right  AIMS (if indicated):     Assets:  Desire for Improvement  Sleep:  Number of Hours: 7.25  Cognition: Impaired,  Mild  ADL's:  Intact   Mental Status Per Nursing Assessment::   On Admission:  NA  Demographic Factors:  Caucasian and Unemployed  Loss Factors: Decline in physical health  Historical Factors: NA  Risk Reduction Factors:   Positive therapeutic  relationship  Continued Clinical Symptoms:  Depression:   Impulsivity  Cognitive Features That Contribute To Risk:  None    Suicide Risk:  Minimal: No identifiable suicidal ideation.  Patients presenting with no risk factors but with morbid ruminations; may be classified as minimal risk based on the severity of the depressive symptoms   Follow-up Information    [facility] Follow up on 10/15/2019.   Why: PLEASE ARRIVE AT 8:00 Contact information: [place] [phone] [address] [place] [phone]              Plan Of Care/Follow-up recommendations:  Activity:  Activity as tolerated Diet:  Regular diet Other:  Follow-up outpatient treatment in the community as well as ECT on Monday  Alethia Berthold, MD 10/12/2019, 6:44 PM

## 2019-10-12 NOTE — Plan of Care (Signed)
°  Problem: Group Participation °Goal: STG - Patient will engage in groups without prompting or encouragement from LRT x3 group sessions within 5 recreation therapy group sessions °Description: STG - Patient will engage in groups without prompting or encouragement from LRT x3 group sessions within 5 recreation therapy group sessions °Outcome: Progressing °  °

## 2019-10-12 NOTE — H&P (Signed)
Chelsey [name] is an 69 y.o. female.   Chief Complaint: mood improved. Chronic stomach pain HPI: recurrent depression  Past Medical History:  Diagnosis Date  . Breast cancer ([place]) 1997   right breast, radiation  . Bronchitis    recent/ 06/09/15 had [facility] x[facility]/ [facility]/resolved  . Cancer [facility]) 1997   right lumpectomy,L/Ad/R   . GERD (gastroesophageal reflux disease)   . History of hiatal hernia   . Hypercholesteremia   . Hyperlipidemia   . Low BP    TYPICALLY RUNS 80'S/60'S  . Panic attack   . Personal history of malignant neoplasm of breast     Past Surgical History:  Procedure Laterality Date  . BICEPT TENODESIS Left 11/23/2016   Procedure: BICEPS TENODESIS;  Surgeon: Leim Fabry, MD;  Location: [facility] ORS;  Service: Orthopedics;  Laterality: Left;  . BREAST EXCISIONAL BIOPSY Right 1997   pos  . BREAST SURGERY Right 1997   lumpectomy  . CHOLECYSTECTOMY N/A 08/06/2016   Procedure: LAPAROSCOPIC CHOLECYSTECTOMY WITH INTRAOPERATIVE CHOLANGIOGRAM;  Surgeon: Christene Lye, MD;  Location: [facility] ORS;  Service: General;  Laterality: N/A;  . COLONOSCOPY  2008  . COLONOSCOPY WITH PROPOFOL N/A 07/21/2015   Procedure: COLONOSCOPY WITH PROPOFOL;  Surgeon: Lucilla Lame, MD;  Location: [place];  Service: Endoscopy;  Laterality: N/A;  . COLONOSCOPY WITH PROPOFOL N/A 01/05/2018   Procedure: COLONOSCOPY WITH PROPOFOL;  Surgeon: Jonathon Bellows, MD;  Location: [facility] ENDOSCOPY;  Service: Gastroenterology;  Laterality: N/A;  . ESOPHAGOGASTRODUODENOSCOPY (EGD) WITH PROPOFOL N/A 06/16/2016   Procedure: ESOPHAGOGASTRODUODENOSCOPY (EGD) WITH PROPOFOL;  Surgeon: Christene Lye, MD;  Location: [facility] ENDOSCOPY;  Service: Endoscopy;  Laterality: N/A;  . ESOPHAGOGASTRODUODENOSCOPY (EGD) WITH PROPOFOL N/A 01/05/2018   Procedure: ESOPHAGOGASTRODUODENOSCOPY (EGD) WITH PROPOFOL;  Surgeon: Jonathon Bellows, MD;  Location: [facility] ENDOSCOPY;  Service: Gastroenterology;  Laterality: N/A;  .  ESOPHAGOGASTRODUODENOSCOPY (EGD) WITH PROPOFOL N/A 06/13/2018   Procedure: ESOPHAGOGASTRODUODENOSCOPY (EGD) WITH PROPOFOL;  Surgeon: Jonathon Bellows, MD;  Location: [facility] ENDOSCOPY;  Service: Gastroenterology;  Laterality: N/A;  . ESOPHAGOGASTRODUODENOSCOPY (EGD) WITH PROPOFOL N/A 07/16/2018   Procedure: ESOPHAGOGASTRODUODENOSCOPY (EGD) WITH PROPOFOL;  Surgeon: Lucilla Lame, MD;  Location: [facility] ENDOSCOPY;  Service: Endoscopy;  Laterality: N/A;  . HARDWARE REMOVAL Left 12/27/2016   Procedure: HARDWARE REMOVAL LEFT  PROXIMAL HUMEROUS;  Surgeon: Leim Fabry, MD;  Location: [facility];  Service: Orthopedics;  Laterality: Left;  . IR GJ TUBE CHANGE  04/21/2018  . NISSEN FUNDOPLICATION N/A 6/57/8469   Procedure: NISSEN FUNDOPLICATION;  Surgeon: Jules Husbands, MD;  Location: [facility];  Service: General;  Laterality: N/A;  . ORIF HUMERUS FRACTURE Left 11/23/2016   Procedure: OPEN REDUCTION INTERNAL FIXATION (ORIF) PROXIMAL HUMERUS FRACTURE;  Surgeon: Leim Fabry, MD;  Location: [facility];  Service: Orthopedics;  Laterality: Left;  . REPAIR OF ESOPHAGUS  03/09/2018   Procedure: REPAIR OF ESOPHAGUS;  Surgeon: Jules Husbands, MD;  Location: [facility];  Service: General;;  . REVERSE SHOULDER ARTHROPLASTY Left 12/27/2016   Procedure: REVERSE SHOULDER ARTHROPLASTY;  Surgeon: Leim Fabry, MD;  Location: [facility];  Service: Orthopedics;  Laterality: Left;  . ROBOTIC ASSISTED LAPAROSCOPIC REPAIR OF PARAESOPHAGEAL HERNIA N/A 03/09/2018   Procedure: ROBOTIC HIATAL HERNIA CONVERTED TO OPEN;  Surgeon: Jules Husbands, MD;  Location: [facility];  Service: General;  Laterality: N/A;    Family History  Problem Relation Age of Onset  . Anxiety disorder Brother   . Obesity Brother   . COPD Brother   . Kidney disease Mother   .  Heart attack Father   . Hypertension Father   . Alcohol abuse Father   . Depression Brother   . Anxiety disorder Brother   . Breast cancer Maternal Grandmother    Social History:  reports that she has  been smoking cigarettes. She started smoking about 9 years ago. She has a 1.00 pack-year smoking history. She has never used smokeless tobacco. She reports that she does not drink alcohol and does not use drugs.  Allergies: No Known Allergies  Medications Prior to Admission  Medication Sig Dispense Refill  . bisacodyl (DULCOLAX) 5 MG EC tablet Take 1 tablet (5 mg total) by mouth daily. 30 tablet 0  . busPIRone (BUSPAR) 10 MG tablet Take 1 tablet (10 mg total) by mouth 2 (two) times daily. 180 tablet 1  . linaclotide (LINZESS) 72 MCG capsule Take 1 capsule (72 mcg total) by mouth daily before breakfast. 30 capsule 2  . omeprazole (PRILOSEC) 20 MG capsule Take 1 capsule (20 mg total) by mouth 2 (two) times daily before a meal. 180 capsule 3  . PARoxetine (PAXIL) 20 MG tablet Take 1 tablet (20 mg total) by mouth daily before supper. 90 tablet 1  . polyethylene glycol (MIRALAX / GLYCOLAX) 17 g packet Take 17 g by mouth daily. 14 each 0  . Simethicone (GAS-X PO) Take 1-2 tablets by mouth daily as needed (gas).    . simvastatin (ZOCOR) 20 MG tablet TAKE ONE TABLET BY MOUTH AT BEDTIME 90 tablet 1  . traZODone (DESYREL) 100 MG tablet Take 1 tablet (100 mg total) by mouth at bedtime. 90 tablet 1    No results found for this or any previous visit (from the past 48 hour(s)). No results found.  Review of Systems  Constitutional: Negative.   HENT: Negative.   Eyes: Negative.   Respiratory: Negative.   Cardiovascular: Negative.   Gastrointestinal: Negative.   Musculoskeletal: Negative.   Skin: Negative.   Neurological: Negative.   Psychiatric/Behavioral: Positive for dysphoric mood. Negative for agitation, behavioral problems, confusion, decreased concentration, hallucinations and suicidal ideas. The patient is not hyperactive.     Blood pressure 106/64, pulse 72, temperature 98.1 F (36.7 C), temperature source Oral, resp. rate 18, height 5\' 5"  (1.651 m), weight 54.3 kg, SpO2 98 %. Physical  Exam Constitutional:      Appearance: She is well-developed.  HENT:     Head: Normocephalic and atraumatic.  Eyes:     Conjunctiva/sclera: Conjunctivae normal.     Pupils: Pupils are equal, round, and reactive to light.  Cardiovascular:     Heart sounds: Normal heart sounds.  Pulmonary:     Effort: Pulmonary effort is normal.  Abdominal:     Palpations: Abdomen is soft.  Musculoskeletal:        General: Normal range of motion.     Cervical back: Normal range of motion.  Skin:    General: Skin is warm and dry.  Neurological:     General: No focal deficit present.     Mental Status: She is alert.  Psychiatric:        Mood and Affect: Affect is blunt.        Thought Content: Thought content does not include suicidal ideation.      Assessment/Plan likely discharge after today but with outpt treatment  Alethia Berthold, MD 10/12/2019, 9:41 AM

## 2019-10-12 NOTE — Transfer of Care (Signed)
Immediate Anesthesia Transfer of Care Note  Patient: Chelsey [name]  Procedure(s) Performed: ECT TX  Patient Location: PACU  Anesthesia Type:General  Level of Consciousness: awake  Airway & Oxygen Therapy: Patient Spontanous Breathing and Patient connected to face mask oxygen  Post-op Assessment: Report given to RN and Post -op Vital signs reviewed and stable  Post vital signs: Reviewed  Last Vitals:  Vitals Value Taken Time  BP    Temp    Pulse 91 10/12/19 1027  Resp 14 10/12/19 1027  SpO2 98 % 10/12/19 1027  Vitals shown include unvalidated device data.  Last Pain:  Vitals:   10/12/19 0839  TempSrc:   PainSc: 0-No pain      Patients Stated Pain Goal: 0 (86/16/83 7290)  Complications: No complications documented.

## 2019-10-12 NOTE — Progress Notes (Addendum)
D: Patient alert and oriented x 4, denies SI/HI/AVH. she is pleasant and cooperative, she does ECT/Depression She appears less anxious and is interacting with peers and staff appropriately.  A: Patient  was offered support and encouragement, given scheduled medications, and was  encouraged to attend groups. Q 15 minute checks were done for safety.  R:Patient attends groups and interacts well with peers and staff, she is complaint with medication.and  receptive to treatment, safety maintained on unit,will continue to monitor.

## 2019-10-13 NOTE — B[facility] Group Notes (Signed)
LCSW Aftercare Discharge Planning Group Note   10/13/2019 1:33 PM- 2:30 PM   Type of Group and Topic: Psychoeducational Group:  Discharge Planning  Participation Level:  Active  Description of Group  Discharge planning group reviews patient's anticipated discharge plans and assists patients to anticipate and address any barriers to wellness/recovery in the community.  Suicide prevention education is reviewed with patients in group.  Therapeutic Goals 1. Patients will state their anticipated discharge plan and mental health aftercare 2. Patients will identify potential barriers to wellness in the community setting 3. Patients will engage in problem solving, solution focused discussion of ways to anticipate and address barriers to wellness/recovery  Summary of Patient Progress: Patient checked into group feeling rocky and out of it. Patient stated that she did not have a plan for discharge. Patient was able to speak about how she would like to feel good and cope with her son better. Patient stated that her physical health could sometimes be a barrier for her. Patient stated this is what cause her mental health. Patient created a vision board of some of her goals and plan for discharge. Patient spoke about incorporating more family time with her grandchildren. Patient also wanted to feel happiness.    Plan for Discharge/Comments:  Patient is considering therapy and possibly will include her son   Transportation Means: Patient has access to transportation   Supports: Patient stated that its hard to find good supports   Therapeutic Modalities: [employer] [place], Latanya Presser 10/13/2019 5:23 PM

## 2019-10-13 NOTE — Progress Notes (Signed)
D: Patient alert and oriented x 4, denies SI/HI/AVH. she is pleasant and cooperative, She appears less anxious and is interacting with peers and staff appropriately.  A: Patient  was offered support and encouragement, given scheduled medications, and was  encouraged to attend groups. Q 15 minute checks were done for safety.  R:Patient attends groups and interacts well with peers and staff, she is complaint with medication.and  receptive to treatment, safety maintained on unit,will continue to monitor.

## 2019-10-13 NOTE — Progress Notes (Signed)
D: Pt alert and oriented. Pt rates depression 5/10, hopelessness 4/10, and anxiety 5/10.Pt goal: "Go to groups and ask any questions about dischange." Pt reports energy level as low and concentration as being good. Pt reports sleep last night as being fair. Pt did not receive medications for sleep. Pt reports experiencing 3/10 Left shoulder pain, prn meds given. Pt denies experiencing any SI/HI, or AVH at this time.   A: Scheduled medications administered to pt, per MD orders. Support and encouragement provided. Frequent verbal contact made. Routine safety checks conducted q15 minutes.   R: No adverse drug reactions noted. Pt verbally contracts for safety at this time. Pt complaint with medications and treatment plan. Pt interacts well with others on the unit. Pt remains safe at this time. Will continue to monitor.

## 2019-10-13 NOTE — Progress Notes (Signed)
[facility] MD Progress Note  10/13/2019 2:57 PM Chelsey [name]  MRN:  [id number]  Principal Problem: Major depressive disorder, recurrent severe without psychotic features ([place]) Diagnosis: Principal Problem:   Major depressive disorder, recurrent severe without psychotic features ([place]) Active Problems:   Gastroesophageal reflux disease without esophagitis   Chronic constipation   Chronic pain syndrome  Mrs. [name] is a  69y.o. female that has a previous psychiatric history of depressive disorder, who presents to the [facility] unit for treatment of depressive disorder in the context of suicidal ideations.    Interval History Patient was seen today for re-evaluation.  Nursing reports no events overnight. The patient reports no issues with performing ADLs.  Patient has been medication compliant.  The patient reports no side effects from medications.  Current symptoms being addressed include:  Depression, anxiety, insomnia.  Since last assessment, patient reports symptoms have improved.    SUBJECTIVE: On assessment patient reports "I am alright. Feel better. Less depressed." Denies suicidal ideations, plans. Reports she had three ECT treatments and is willing to continue. Reports feeling tired, but "good overall". Reports she slept well last night. She thinks she is ready for discharge tomorrow and can contract for safety if discharged.  Current suicidal/homicidal ideations: Denies Current auditory/visual hallucinations: Denies  Review Of Systems: A complete review of systems of the following systems was conducted (Constitutional, Psychiatric, Neurological, Musculoskeletal, Eyes, Gastrointestinal, Cardiovascular, Respiratory, Skin, and Endocrine). All reviewed systems are negative.  Labs: glucose 81 mg/dl  Total Time spent with patient: 15 minutes  Past Psychiatric History: see H&P  Past Medical History:  Past Medical History:  Diagnosis Date  . Breast cancer ([place]) 1997   right breast, radiation   . Bronchitis    recent/ 06/09/15 had [facility]/ [facility]/resolved  . Cancer [facility]) 1997   right lumpectomy,L/Ad/R   . GERD (gastroesophageal reflux disease)   . History of hiatal hernia   . Hypercholesteremia   . Hyperlipidemia   . Low BP    TYPICALLY RUNS 80'S/60'S  . Panic attack   . Personal history of malignant neoplasm of breast     Past Surgical History:  Procedure Laterality Date  . BICEPT TENODESIS Left 11/23/2016   Procedure: BICEPS TENODESIS;  Surgeon: Leim Fabry, MD;  Location: [facility];  Service: Orthopedics;  Laterality: Left;  . BREAST EXCISIONAL BIOPSY Right 1997   pos  . BREAST SURGERY Right 1997   lumpectomy  . CHOLECYSTECTOMY N/A 08/06/2016   Procedure: LAPAROSCOPIC CHOLECYSTECTOMY WITH INTRAOPERATIVE CHOLANGIOGRAM;  Surgeon: Christene Lye, MD;  Location: [facility];  Service: General;  Laterality: N/A;  . COLONOSCOPY  2008  . COLONOSCOPY WITH PROPOFOL N/A 07/21/2015   Procedure: COLONOSCOPY WITH PROPOFOL;  Surgeon: Lucilla Lame, MD;  Location: [place];  Service: Endoscopy;  Laterality: N/A;  . COLONOSCOPY WITH PROPOFOL N/A 01/05/2018   Procedure: COLONOSCOPY WITH PROPOFOL;  Surgeon: Jonathon Bellows, MD;  Location: [facility] ENDOSCOPY;  Service: Gastroenterology;  Laterality: N/A;  . ESOPHAGOGASTRODUODENOSCOPY (EGD) WITH PROPOFOL N/A 06/16/2016   Procedure: ESOPHAGOGASTRODUODENOSCOPY (EGD) WITH PROPOFOL;  Surgeon: Christene Lye, MD;  Location: [facility] ENDOSCOPY;  Service: Endoscopy;  Laterality: N/A;  . ESOPHAGOGASTRODUODENOSCOPY (EGD) WITH PROPOFOL N/A 01/05/2018   Procedure: ESOPHAGOGASTRODUODENOSCOPY (EGD) WITH PROPOFOL;  Surgeon: Jonathon Bellows, MD;  Location: [facility] ENDOSCOPY;  Service: Gastroenterology;  Laterality: N/A;  . ESOPHAGOGASTRODUODENOSCOPY (EGD) WITH PROPOFOL N/A 06/13/2018   Procedure: ESOPHAGOGASTRODUODENOSCOPY (EGD) WITH PROPOFOL;  Surgeon: Jonathon Bellows, MD;  Location: [facility] ENDOSCOPY;  Service: Gastroenterology;  Laterality:  N/A;  .  ESOPHAGOGASTRODUODENOSCOPY (EGD) WITH PROPOFOL N/A 07/16/2018   Procedure: ESOPHAGOGASTRODUODENOSCOPY (EGD) WITH PROPOFOL;  Surgeon: Lucilla Lame, MD;  Location: [facility] ENDOSCOPY;  Service: Endoscopy;  Laterality: N/A;  . HARDWARE REMOVAL Left 12/27/2016   Procedure: HARDWARE REMOVAL LEFT  PROXIMAL HUMEROUS;  Surgeon: Leim Fabry, MD;  Location: [facility];  Service: Orthopedics;  Laterality: Left;  . IR GJ TUBE CHANGE  04/21/2018  . NISSEN FUNDOPLICATION N/A 05/23/7679   Procedure: NISSEN FUNDOPLICATION;  Surgeon: Jules Husbands, MD;  Location: [facility];  Service: General;  Laterality: N/A;  . ORIF HUMERUS FRACTURE Left 11/23/2016   Procedure: OPEN REDUCTION INTERNAL FIXATION (ORIF) PROXIMAL HUMERUS FRACTURE;  Surgeon: Leim Fabry, MD;  Location: [facility];  Service: Orthopedics;  Laterality: Left;  . REPAIR OF ESOPHAGUS  03/09/2018   Procedure: REPAIR OF ESOPHAGUS;  Surgeon: Jules Husbands, MD;  Location: [facility];  Service: General;;  . REVERSE SHOULDER ARTHROPLASTY Left 12/27/2016   Procedure: REVERSE SHOULDER ARTHROPLASTY;  Surgeon: Leim Fabry, MD;  Location: [facility];  Service: Orthopedics;  Laterality: Left;  . ROBOTIC ASSISTED LAPAROSCOPIC REPAIR OF PARAESOPHAGEAL HERNIA N/A 03/09/2018   Procedure: ROBOTIC HIATAL HERNIA CONVERTED TO OPEN;  Surgeon: Jules Husbands, MD;  Location: [facility];  Service: General;  Laterality: N/A;   Family History:  Family History  Problem Relation Age of Onset  . Anxiety disorder Brother   . Obesity Brother   . COPD Brother   . Kidney disease Mother   . Heart attack Father   . Hypertension Father   . Alcohol abuse Father   . Depression Brother   . Anxiety disorder Brother   . Breast cancer Maternal Grandmother    Family Psychiatric  History: see H&P Social History:  Social History   Substance and Sexual Activity  Alcohol Use No  . Alcohol/week: 0.0 standard drinks     Social History   Substance and Sexual Activity  Drug Use No    Social  History   Socioeconomic History  . Marital status: Widowed    Spouse name: Not on file  . Number of children: 2  . Years of education: Not on file  . Highest education level: Bachelor's degree (e.g., BA, AB, BS)  Occupational History  . Not on file  Tobacco Use  . Smoking status: Current Some Day Smoker    Packs/day: 0.10    Years: 10.00    Pack years: 1.00    Types: Cigarettes    Start date: 11/25/2009  . Smokeless tobacco: Never Used  . Tobacco comment: 1 cigarette daily  Vaping Use  . Vaping Use: Never used  Substance and Sexual Activity  . Alcohol use: No    Alcohol/week: 0.0 standard drinks  . Drug use: No  . Sexual activity: Never  Other Topics Concern  . Not on file  Social History Narrative  . Not on file   Social Determinants of Health   Financial Resource Strain:   . Difficulty of Paying Living Expenses: Not on file  Food Insecurity:   . Worried About Charity fundraiser in the Last Year: Not on file  . Ran Out of Food in the Last Year: Not on file  Transportation Needs:   . Lack of Transportation (Medical): Not on file  . Lack of Transportation (Non-Medical): Not on file  Physical Activity:   . Days of Exercise per Week: Not on file  . Minutes of Exercise per Session: Not on file  Stress:   . Feeling of Stress :  Not on file  Social Connections:   . Frequency of Communication with Friends and Family: Not on file  . Frequency of Social Gatherings with Friends and Family: Not on file  . Attends Religious Services: Not on file  . Active Member of Clubs or Organizations: Not on file  . Attends Archivist Meetings: Not on file  . Marital Status: Not on file   Additional Social History:                         Sleep: good  Appetite:  good  Current Medications: Current Facility-Administered Medications  Medication Dose Route Frequency Provider Last Rate Last Admin  . 0.9 %  sodium chloride infusion  500 mL Intravenous Once Clapacs,  John T, MD      . 0.9 %  sodium chloride infusion  500 mL Intravenous Once Clapacs, John T, MD      . acetaminophen (TYLENOL) tablet 650 mg  650 mg Oral Q6H PRN Patrecia Pour, NP   650 mg at 10/13/19 0804  . alum & mag hydroxide-simeth (MAALOX/MYLANTA) 200-200-20 MG/5ML suspension 30 mL  30 mL Oral Q4H PRN Patrecia Pour, NP   30 mL at 10/10/19 2219  . buPROPion (WELLBUTRIN XL) 24 hr tablet 300 mg  300 mg Oral Daily Clapacs, Madie Reno, MD   300 mg at 10/13/19 0804  . busPIRone (BUSPAR) tablet 10 mg  10 mg Oral TID Clapacs, Madie Reno, MD   10 mg at 10/13/19 1222  . docusate sodium (COLACE) capsule 100 mg  100 mg Oral BID Clapacs, Madie Reno, MD   100 mg at 10/13/19 0804  . ibuprofen (ADVIL) tablet 600 mg  600 mg Oral Q6H PRN Clapacs, Madie Reno, MD   600 mg at 10/10/19 1106  . linaclotide (LINZESS) capsule 72 mcg  72 mcg Oral Q0600 Hampton Abbot, MD   72 mcg at 10/13/19 0728  . magnesium hydroxide (MILK OF MAGNESIA) suspension 30 mL  30 mL Oral Daily PRN Patrecia Pour, NP   30 mL at 10/12/19 2140  . OLANZapine (ZYPREXA) tablet 5 mg  5 mg Oral QHS Clapacs, John T, MD   5 mg at 10/12/19 2140  . pantoprazole (PROTONIX) EC tablet 40 mg  40 mg Oral Daily Patrecia Pour, NP   40 mg at 10/13/19 0804  . polyethylene glycol (MIRALAX / GLYCOLAX) packet 17 g  17 g Oral Daily Patrecia Pour, NP   17 g at 10/13/19 0804  . senna-docusate (Senokot-S) tablet 2 tablet  2 tablet Oral BID PRN Clapacs, Madie Reno, MD   2 tablet at 10/13/19 1223  . traZODone (DESYREL) tablet 100 mg  100 mg Oral QHS Clapacs, Madie Reno, MD   100 mg at 10/12/19 2139    Lab Results:  Results for orders placed or performed during the hospital encounter of 09/25/19 (from the past 48 hour(s))  Glucose, capillary     Status: None   Collection Time: 10/12/19  6:20 AM  Result Value Ref Range   Glucose-Capillary 81 70 - 99 mg/dL    Comment: Glucose reference range applies only to samples taken after fasting for at least 8 hours.    Blood Alcohol level:   Lab Results  Component Value Date   ETH <10 09/24/2019   ETH <10 76/19/5093    Metabolic Disorder Labs: No results found for: HGBA1C, MPG No results found for: PROLACTIN Lab Results  Component Value Date  CHOL 164 10/02/2019   TRIG 117 10/02/2019   HDL 52 10/02/2019   CHOLHDL 3.2 10/02/2019   VLDL 23 10/02/2019   LDLCALC 89 10/02/2019   LDLCALC 87 07/27/2017    Physical Findings: AIMS:  , ,  ,  ,    CIWA:    COWS:     Musculoskeletal: Strength & Muscle Tone: within normal limits Gait & Station: normal Patient leans: N/A  Psychiatric Specialty Exam: Physical Exam   Review of Systems   Blood pressure 97/71, pulse 80, temperature 97.7 F (36.5 C), temperature source Oral, resp. rate 18, height 5\' 5"  (1.651 m), weight 54.3 kg, SpO2 99 %.Body mass index is 19.92 kg/m.  General Appearance: Casual  Eye Contact: good  Speech:  Slow  Volume:  Decreased  Mood: euthymic  Affect:  constricted  Thought Process:  Coherent and Goal Directed  Orientation:  Full (Time, Place, and Person)  Thought Content:  Logical  Suicidal Thoughts:  No  Homicidal Thoughts:  No  Memory:  NA  Judgement:  fair  Insight:  Fair  Psychomotor Activity: WNL  Concentration:  Concentration: Fair and Attention Span: Fair  Recall:  [employer] of Knowledge:  Fair  Language:  Fair  Akathisia:  No  Handed:  Right  AIMS (if indicated):     Assets:  Desire for Improvement  ADL's:  Intact  Cognition:  WNL  Sleep:  Number of Hours: 8     Treatment Plan Summary: Daily contact with patient to assess and evaluate symptoms and progress in treatment and Medication management   Patient is a 69 year old female with the above-stated past psychiatric history who is seen in follow-up.  Chart reviewed. Patient discussed with nursing. Patient denies feeling depressed, she is not suicidal. She thinks she is ready for discharge and can contract for safety. She is planning to continue outpatient ECT. No  medication changes today.  Plan:  -continue inpatient psych admission; 15-minute checks; daily contact with patient to assess and evaluate symptoms and progress in treatment; psychoeducation.  -continue scheduled psych medications: . buPROPion  300 mg Oral Daily  . busPIRone  10 mg Oral TID  . docusate sodium  100 mg Oral BID  . linaclotide  72 mcg Oral Q0600  . OLANZapine  5 mg Oral QHS  . pantoprazole  40 mg Oral Daily  . polyethylene glycol  17 g Oral Daily  . traZODone  100 mg Oral QHS   -continue PRN medications.  acetaminophen, alum & mag hydroxide-simeth, ibuprofen, magnesium hydroxide, senna-docusate  -Disposition: to be determined. Likely d/c home tomorrow with outpatient psych follow-up.  Larita Fife, MD 10/13/2019, 2:57 PM

## 2019-10-13 NOTE — [facility] Group Notes (Signed)
[place] Group Notes:  (Nursing/MHT/Case Management/Adjunct)  Date:  10/13/2019  Time:  11:05 PM  Type of Therapy:  Group Therapy  Participation Level:  Active  Participation Quality:  Appropriate  Affect:  Appropriate  Cognitive:  Alert  Insight:  Good  Engagement in Group:  Engaged and she said she was going to be discharge tomorrow and she know what treatment she have when she come back  Modes of Intervention:  Support  Summary of Progress/Problems:  Chelsey [name] 10/13/2019, 11:05 PM

## 2019-10-14 NOTE — Discharge Summary (Signed)
Physician Discharge Summary Note  Patient:  Chelsey [name] is an 69 y.o., female MRN:  [id number] DOB:  [birth year] Patient phone:  [phone] (home)  Patient address:   [place] [address],  Total Time spent with patient: 30 minutes  Date of Admission:  09/25/2019 Date of Discharge: 10/14/2019  Reason for Admission: suicidal ideations, depression, s/p suicide atempt via overdose on medication.  Principal Problem: Major depressive disorder, recurrent severe without psychotic features [facility]) Discharge Diagnoses: Principal Problem:   Major depressive disorder, recurrent severe without psychotic features ([place]) Active Problems:   Gastroesophageal reflux disease without esophagitis   Chronic constipation   Chronic pain syndrome   Past Psychiatric History:History of longstanding recurrent depression with recent suicidal behavior.   Past Medical History:  Past Medical History:  Diagnosis Date   Breast cancer ([place]) 1997   right breast, radiation   Bronchitis    recent/ 06/09/15 had chest xray[facility]/resolved   Cancer [facility]) 1997   right lumpectomy,L/Ad/R    GERD (gastroesophageal reflux disease)    History of hiatal hernia    Hypercholesteremia    Hyperlipidemia    Low BP    TYPICALLY RUNS 80'S/60'S   Panic attack    Personal history of malignant neoplasm of breast     Past Surgical History:  Procedure Laterality Date   BICEPT TENODESIS Left 11/23/2016   Procedure: BICEPS TENODESIS;  Surgeon: Leim Fabry, MD;  Location: [facility] ORS;  Service: Orthopedics;  Laterality: Left;   BREAST EXCISIONAL BIOPSY Right 1997   pos   BREAST SURGERY Right 1997   lumpectomy   CHOLECYSTECTOMY N/A 08/06/2016   Procedure: LAPAROSCOPIC CHOLECYSTECTOMY WITH INTRAOPERATIVE CHOLANGIOGRAM;  Surgeon: Christene Lye, MD;  Location: [facility];  Service: General;  Laterality: N/A;   COLONOSCOPY  2008   COLONOSCOPY WITH PROPOFOL N/A 07/21/2015    Procedure: COLONOSCOPY WITH PROPOFOL;  Surgeon: Lucilla Lame, MD;  Location: [facility];  Service: Endoscopy;  Laterality: N/A;   COLONOSCOPY WITH PROPOFOL N/A 01/05/2018   Procedure: COLONOSCOPY WITH PROPOFOL;  Surgeon: Jonathon Bellows, MD;  Location: [facility] ENDOSCOPY;  Service: Gastroenterology;  Laterality: N/A;   ESOPHAGOGASTRODUODENOSCOPY (EGD) WITH PROPOFOL N/A 06/16/2016   Procedure: ESOPHAGOGASTRODUODENOSCOPY (EGD) WITH PROPOFOL;  Surgeon: Christene Lye, MD;  Location: [facility] ENDOSCOPY;  Service: Endoscopy;  Laterality: N/A;   ESOPHAGOGASTRODUODENOSCOPY (EGD) WITH PROPOFOL N/A 01/05/2018   Procedure: ESOPHAGOGASTRODUODENOSCOPY (EGD) WITH PROPOFOL;  Surgeon: Jonathon Bellows, MD;  Location: [facility] ENDOSCOPY;  Service: Gastroenterology;  Laterality: N/A;   ESOPHAGOGASTRODUODENOSCOPY (EGD) WITH PROPOFOL N/A 06/13/2018   Procedure: ESOPHAGOGASTRODUODENOSCOPY (EGD) WITH PROPOFOL;  Surgeon: Jonathon Bellows, MD;  Location: [facility] ENDOSCOPY;  Service: Gastroenterology;  Laterality: N/A;   ESOPHAGOGASTRODUODENOSCOPY (EGD) WITH PROPOFOL N/A 07/16/2018   Procedure: ESOPHAGOGASTRODUODENOSCOPY (EGD) WITH PROPOFOL;  Surgeon: Lucilla Lame, MD;  Location: [facility] ENDOSCOPY;  Service: Endoscopy;  Laterality: N/A;   HARDWARE REMOVAL Left 12/27/2016   Procedure: HARDWARE REMOVAL LEFT  PROXIMAL HUMEROUS;  Surgeon: Leim Fabry, MD;  Location: [facility];  Service: Orthopedics;  Laterality: Left;   IR GJ TUBE CHANGE  1/88/4166   NISSEN FUNDOPLICATION N/A 0/63/0160   Procedure: NISSEN FUNDOPLICATION;  Surgeon: Jules Husbands, MD;  Location: [facility];  Service: General;  Laterality: N/A;   ORIF HUMERUS FRACTURE Left 11/23/2016   Procedure: OPEN REDUCTION INTERNAL FIXATION (ORIF) PROXIMAL HUMERUS FRACTURE;  Surgeon: Leim Fabry, MD;  Location: [facility];  Service: Orthopedics;  Laterality: Left;   REPAIR OF ESOPHAGUS  03/09/2018   Procedure: REPAIR OF ESOPHAGUS;  Surgeon: Jules Husbands, MD;  Location: [facility];   Service: General;;   REVERSE SHOULDER ARTHROPLASTY Left 12/27/2016   Procedure: REVERSE SHOULDER ARTHROPLASTY;  Surgeon: Leim Fabry, MD;  Location: [facility];  Service: Orthopedics;  Laterality: Left;   ROBOTIC ASSISTED LAPAROSCOPIC REPAIR OF PARAESOPHAGEAL HERNIA N/A 03/09/2018   Procedure: ROBOTIC HIATAL HERNIA CONVERTED TO OPEN;  Surgeon: Jules Husbands, MD;  Location: [facility];  Service: General;  Laterality: N/A;   Family History:  Family History  Problem Relation Age of Onset   Anxiety disorder Brother    Obesity Brother    COPD Brother    Kidney disease Mother    Heart attack Father    Hypertension Father    Alcohol abuse Father    Depression Brother    Anxiety disorder Brother    Breast cancer Maternal Grandmother     Social History:  Social History   Substance and Sexual Activity  Alcohol Use No   Alcohol/week: 0.0 standard drinks     Social History   Substance and Sexual Activity  Drug Use No    Social History   Socioeconomic History   Marital status: Widowed    Spouse name: Not on file   Number of children: 2   Years of education: Not on file   Highest education level: Bachelor's degree (e.g., BA, AB, BS)  Occupational History   Not on file  Tobacco Use   Smoking status: Current Some Day Smoker    Packs/day: 0.10    Years: 10.00    Pack years: 1.00    Types: Cigarettes    Start date: 11/25/2009   Smokeless tobacco: Never Used   Tobacco comment: 1 cigarette daily  Vaping Use   Vaping Use: Never used  Substance and Sexual Activity   Alcohol use: No    Alcohol/week: 0.0 standard drinks   Drug use: No   Sexual activity: Never  Other Topics Concern   Not on file  Social History Narrative   Not on file   Social Determinants of Health   Financial Resource Strain:    Difficulty of Paying Living Expenses: Not on file  Food Insecurity:    Worried About [employer] in the Last Year: Not on file   [employer] of Food  in the Last Year: Not on file  Transportation Needs:    Lack of Transportation (Medical): Not on file   Lack of Transportation (Non-Medical): Not on file  Physical Activity:    Days of Exercise per Week: Not on file   Minutes of Exercise per Session: Not on file  Stress:    Feeling of Stress : Not on file  Social Connections:    Frequency of Communication with Friends and Family: Not on file   Frequency of Social Gatherings with Friends and Family: Not on file   Attends Religious Services: Not on file   Active Member of Clubs or Organizations: Not on file   Attends Archivist Meetings: Not on file   Marital Status: Not on file    Hospital Course:   Mrs. Rotundo is a  69y.o. female that has a previous psychiatric history of depressive disorder, who presents to the [facility] unit for treatment of depressive disorder in the context of suicidal ideations.   The patient was admitted to Adult Psychiatry under the care of Dr. Weber Cooks on 09/26/19 a voluntary basis. She was restricted to ward and placed on suicide precautions. Patient was introduced to milieu  activities and encouraged to participate in psycho-social groups. The plan at the time of admission was safety, stabilization and treatment.  For the management of depressive disorder, patient was continued on home medication Paxil at 40 mg p.o. daily initially, then she was switched to Wellbutrin with gradual increase dose to 300mg  PO daily; she was also managed on Buspirone 10mg  PO TID for anxiety. Zyprexa was started and eventually increased to 5mg  PO QHS for management of her severe depression. We initially switched her from Trazodone to Doxepin for management of insomnia, although the medication was not effective and patient was restarted on Trazodone with dose increase to 100mg  QHS. We hold off on any control medications at the time of discharge. In addition to medications, patient underwent three ECT sessions for her  depression; sessions went without complication. She is planning to continue ECT on outpatient basis. She does have a history of constipation and is being discharged on Colace, Linzess and Miralax as well as Pantoprazole for GERD. Her EKG showed a sinus rhythm with a normal QTc interval. That was monitored. Patienty never required as needed medications for agitation or psychosis. She participated in groups and socialized with few peers. She never required seclusion or restraints. Her initial symptoms of depression had improved during the course of hospitalization.  She reports feeling much better, reports being in good mood, denies any thoughts of harming self, denies thoughts of harming others. Denies any physical complaints. She reports he can contract for safety if discharged home.   On the day of discharge 10/14/19, the patient was considered an acute LOW risk of self harm despite recent overdose. Patients current age represents non-modifiable/baseline risk factors. Presence of mental disorder (anxiety) is dynamic risk factor. The patient denies suicidal thoughts, denies access to firearm. Patient is future-oriented, has responsibilities, help-seeking, has access to mental health care, has family and community support, has cultural/religious beliefs that discourage suicide - all protective factors. Therefore, represents a low risk for harming self acutely and elevated chronic risk due to non-modifiable risk factors.  [place] Suicide Severity Rating Scale Wish to be dead: No Suicidal thoughts: No                 Suicidal thoughts with method: No                 Suicidal intent: No                 Suicide intent with specific plan: No                 Suicide behavior: No     Physical Findings: AIMS:  , ,  ,  ,    CIWA:    COWS:     Psychiatric Specialty Exam: Physical Exam   Review of Systems   Blood pressure 97/71, pulse 80, temperature 97.7 F (36.5 C), temperature source Oral, resp.  rate 18, height 5\' 5"  (1.651 m), weight 54.3 kg, SpO2 99 %.Body mass index is 19.92 kg/m.  General Appearance: Casual  Eye Contact: good  Speech:  Slow  Volume:  Decreased  Mood: euthymic  Affect:  constricted  Thought Process:  Coherent and Goal Directed  Orientation:  Full (Time, Place, and Person)  Thought Content:  Logical  Suicidal Thoughts:  No  Homicidal Thoughts:  No  Memory:  NA  Judgement:  fair  Insight:  Fair  Psychomotor Activity: WNL  Concentration:  Concentration: Fair and Attention Span: Fair  Recall:  Fair  Fund of Knowledge:  Fair  Language:  Fair  Akathisia:  No  Handed:  Right  AIMS (if indicated):     Assets:  Desire for Improvement  ADL's:  Intact  Cognition:  WNL  Sleep:  Number of Hours: 8      Have you used any form of tobacco in the last 30 days? (Cigarettes, Smokeless Tobacco, Cigars, and/or Pipes): Yes  Has this patient used any form of tobacco in the last 30 days? (Cigarettes, Smokeless Tobacco, Cigars, and/or Pipes) Yes.  Blood Alcohol level:  Lab Results  Component Value Date   ETH <10 09/24/2019   ETH <10 61/60/7371    Metabolic Disorder Labs:  No results found for: HGBA1C, MPG No results found for: PROLACTIN Lab Results  Component Value Date   CHOL 164 10/02/2019   TRIG 117 10/02/2019   HDL 52 10/02/2019   CHOLHDL 3.2 10/02/2019   VLDL 23 10/02/2019   LDLCALC 89 10/02/2019   LDLCALC 87 07/27/2017    See Psychiatric Specialty Exam and Suicide Risk Assessment completed by Attending Physician prior to discharge.  Discharge destination:  Home  Is patient on multiple antipsychotic therapies at discharge:  No   Has Patient had three or more failed trials of antipsychotic monotherapy by history:  No  Recommended Plan for Multiple Antipsychotic Therapies: NA   Allergies as of 10/14/2019   No Known Allergies     Medication List    STOP taking these medications   bisacodyl 5 MG EC tablet Commonly known as: DULCOLAX    GAS-X PO   omeprazole 20 MG capsule Commonly known as: PRILOSEC Replaced by: pantoprazole 40 MG tablet   PARoxetine 20 MG tablet Commonly known as: PAXIL   simvastatin 20 MG tablet Commonly known as: ZOCOR     TAKE these medications     Indication  buPROPion 300 MG 24 hr tablet Commonly known as: WELLBUTRIN XL Take 1 tablet (300 mg total) by mouth daily.  Indication: Major Depressive Disorder   busPIRone 10 MG tablet Commonly known as: BUSPAR Take 1 tablet (10 mg total) by mouth 3 (three) times daily. What changed: when to take this  Indication: Major Depressive Disorder   docusate sodium 100 MG capsule Commonly known as: COLACE Take 1 capsule (100 mg total) by mouth 2 (two) times daily.  Indication: Constipation   linaclotide 72 MCG capsule Commonly known as: Linzess Take 1 capsule (72 mcg total) by mouth daily at 6 (six) AM. What changed: when to take this  Indication: Constipation caused by Irritable Bowel Syndrome   OLANZapine 5 MG tablet Commonly known as: ZYPREXA Take 1 tablet (5 mg total) by mouth at bedtime.  Indication: Major Depressive Disorder   pantoprazole 40 MG tablet Commonly known as: PROTONIX Take 1 tablet (40 mg total) by mouth daily. Replaces: omeprazole 20 MG capsule  Indication: Gastroesophageal Reflux Disease   polyethylene glycol 17 g packet Commonly known as: MIRALAX / GLYCOLAX Take 17 g by mouth daily.  Indication: Constipation   traZODone 100 MG tablet Commonly known as: DESYREL Take 1 tablet (100 mg total) by mouth at bedtime.  Indication: Trouble Sleeping       Follow-up Information    [facility] Follow up on 10/15/2019.   Why: PLEASE ARRIVE AT 8:00 Contact information: [place] [id number] [address] [place] [phone]              Follow-up recommendations:  Activity:  as tolerates Diet:  high-fiber Other:  continue ECT, psych medication management by outpatient  psychiatrist.    Signed: Larita Fife, MD 10/14/2019, 12:26 PM

## 2019-10-14 NOTE — Anesthesia Postprocedure Evaluation (Signed)
Anesthesia Post Note  Patient: SAHRA CONVERSE  Procedure(s) Performed: ECT TX  Patient location during evaluation: PACU Anesthesia Type: General Level of consciousness: awake and alert Pain management: pain level controlled Vital Signs Assessment: post-procedure vital signs reviewed and stable Respiratory status: spontaneous breathing, nonlabored ventilation, respiratory function stable and patient connected to nasal cannula oxygen Cardiovascular status: blood pressure returned to baseline and stable Postop Assessment: no apparent nausea or vomiting Anesthetic complications: no   No complications documented.   Last Vitals:  Vitals:   10/13/19 0614 10/14/19 0616  BP: 97/71 (!) 92/57  Pulse: 80   Resp: 18 18  Temp: 36.5 C 36.8 C  SpO2: 99%     Last Pain:  Vitals:   10/14/19 0616  TempSrc: Oral  PainSc:                  Precious Haws Loreda Silverio

## 2019-10-14 NOTE — Progress Notes (Signed)
Patient is alert and oriented. She is active on the unit spending a lot of her time in the milieu watching TV and engaging with others. She denies SI/HI/AVH and pain at this encounter.  She does endorse anxiety and depression, but states its a mild case.  She is med compliant and tolerated her meds without incident. She remains safe on the unit and informed to contact staff with any concerns.      Cleo Butler-Nicholson, LPN

## 2019-10-14 NOTE — Progress Notes (Signed)
Pt is alert and oriented to person, place, time and situation. Pt is calm, cooperative, pleasant, affect is flat. Pt reports mild depression rates it 3/10 on a 0-10, 10 being worst. Pt participates in unit activities, spends time watching tv in the dayroom, reports she slept good and her appetite is good. Pt reports she is looking forward for discharge tomorrow. Pt denies suicidal and homicidal ideation, denies hallucinations, denies feelings anxiety. No distress noted, none reported, will continue to monitor pt per Q15 minute face checks and monitor for safety and progress.

## 2019-10-14 NOTE — [facility] Counselor (Signed)
Dr. Danella Sensing was contacted to verify discharge plan for patient. It was unsure of who would be providing patients aftercare. CSW reviewed handoff and saw [facility] was listed. CSW spoke with client about following up with [facility]. Patient stated she did not know and asked CSW who would be scheduling her an appointment. CSW let patient know [facility] is closed on Saturday and Sunday and CSW is unable to verify any appointments. Patient appeared to uneasy about calling and making her follow-up appointment for tomorrow. It was decided per Dr. Danella Sensing that patient will be discharged tomorrow 10/15/19. Patient has an ECT Therapy appointment scheduled for 8:00 AM on 10/15/19.

## 2019-10-14 NOTE — [facility] Group Notes (Signed)
LCSW Group Therapy Note  10/14/2019   1:08 PM- 1:52 PM   Type of Therapy and Topic:  Group Therapy: Anger Cues and Responses  Participation Level:  Active   Description of Group:   In this group, patients learned how to recognize the physical, cognitive, emotional, and behavioral responses they have to anger-provoking situations.  They identified a recent time they became angry and how they reacted.  They analyzed how their reaction was possibly beneficial and how it was possibly unhelpful.  The group discussed a variety of healthier coping skills that could help with such a situation in the future.  Focus was placed on how helpful it is to recognize the underlying emotions to our anger, because working on those can lead to a more permanent solution as well as our ability to focus on the important rather than the urgent.  Therapeutic Goals: 1. Patients will remember their last incident of anger and how they felt emotionally and physically, what their thoughts were at the time, and how they behaved. 2. Patients will identify how their behavior at that time worked for them, as well as how it worked against them. 3. Patients will explore possible new behaviors to use in future anger situations. 4. Patients will learn that anger itself is normal and cannot be eliminated, and that healthier reactions can assist with resolving conflict rather than worsening situations.  Summary of Patient Progress:  Patient came into group late but was able to identify some coping skills she could use when she gets upset. Patient did not share the last time she was angry and how she reacted. Patient was able to tell CSW that she shuts down and gets quiet when she is triggered by someone.    Therapeutic Modalities:   Cognitive Behavioral Therapy    Raina Mina, Latanya Presser 10/14/2019  2:29 PM

## 2019-10-14 NOTE — Plan of Care (Signed)
  Problem: Education: Goal: Knowledge of [facility] Education information/materials will improve Outcome: Progressing Goal: Emotional status will improve Outcome: Progressing Goal: Mental status will improve Outcome: Progressing Goal: Verbalization of understanding the information provided will improve Outcome: Progressing   Problem: Safety: Goal: Periods of time without injury will increase Outcome: Progressing   Problem: Education: Goal: Ability to make informed decisions regarding treatment will improve Outcome: Progressing   Problem: Self-Concept: Goal: Ability to disclose and discuss suicidal ideas will improve Outcome: Progressing Goal: Will verbalize positive feelings about self Outcome: Progressing   Problem: Education: Goal: Utilization of techniques to improve thought processes will improve Outcome: Progressing Goal: Knowledge of the prescribed therapeutic regimen will improve Outcome: Progressing   Problem: Activity: Goal: Interest or engagement in leisure activities will improve Outcome: Progressing Goal: Imbalance in normal sleep/wake cycle will improve Outcome: Progressing   Problem: Coping: Goal: Coping ability will improve Outcome: Progressing Goal: Will verbalize feelings Outcome: Progressing   Problem: Health Behavior/Discharge Planning: Goal: Ability to make decisions will improve Outcome: Progressing Goal: Compliance with therapeutic regimen will improve Outcome: Progressing   Problem: Role Relationship: Goal: Will demonstrate positive changes in social behaviors and relationships Outcome: Progressing   Problem: Safety: Goal: Ability to disclose and discuss suicidal ideas will improve Outcome: Progressing Goal: Ability to identify and utilize support systems that promote safety will improve Outcome: Progressing   Problem: Self-Concept: Goal: Will verbalize positive feelings about self Outcome: Progressing Goal: Level of anxiety  will decrease Outcome: Progressing   Problem: Education: Goal: Ability to state activities that reduce stress will improve Outcome: Progressing   Problem: Coping: Goal: Ability to identify and develop effective coping behavior will improve Outcome: Progressing   Problem: Self-Concept: Goal: Ability to identify factors that promote anxiety will improve Outcome: Progressing Goal: Level of anxiety will decrease Outcome: Progressing Goal: Ability to modify response to factors that promote anxiety will improve Outcome: Progressing

## 2019-10-14 NOTE — Progress Notes (Signed)
Per Dr. Lucas Mallow after speaking with social worker and this Probation officer and pt, plan is to discharge tomorrow, after ECT and after a follow up  Medication management appointment is established. Today's discharge order was discontinued.

## 2019-10-15 ENCOUNTER — Encounter: Payer: Self-pay | Admitting: Psychiatry

## 2019-10-15 ENCOUNTER — Inpatient Hospital Stay: Payer: Medicare Other | Admitting: Anesthesiology

## 2019-10-15 MED ORDER — KETAMINE HCL 50 MG/ML IJ SOLN
INTRAMUSCULAR | Status: AC
  Filled 2019-10-15: qty 10

## 2019-10-15 MED ORDER — SUCCINYLCHOLINE CHLORIDE 20 MG/ML IJ SOLN
INTRAMUSCULAR | Status: DC | PRN
  Administered 2019-10-15: 80 mg via INTRAVENOUS

## 2019-10-15 MED ORDER — KETAMINE HCL 10 MG/ML IJ SOLN
INTRAMUSCULAR | Status: DC | PRN
  Administered 2019-10-15: 80 mg via INTRAVENOUS

## 2019-10-15 MED ORDER — LABETALOL HCL 5 MG/ML IV SOLN
INTRAVENOUS | Status: AC
  Filled 2019-10-15: qty 4

## 2019-10-15 MED ORDER — SODIUM CHLORIDE 0.9 % IV SOLN: 500.0000 mL | Freq: Once | INTRAVENOUS | Status: DC

## 2019-10-15 NOTE — Discharge Summary (Signed)
Physician Discharge Summary Note  Patient:  Chelsey [name] is an 69 y.o., female MRN:  [id number] DOB:  1950-05-22 Patient phone:  [phone] (home)  Patient address:   [place] [address],  Total Time spent with patient: 30 minutes  Date of Admission:  09/25/2019 Date of Discharge:10/15/19  Reason for Admission:  Suicide attempt  Principal Problem: Major depressive disorder, recurrent severe without psychotic features [facility]) Discharge Diagnoses: Principal Problem:   Major depressive disorder, recurrent severe without psychotic features ([place]) Active Problems:   Gastroesophageal reflux disease without esophagitis   Chronic constipation   Chronic pain syndrome   Past Psychiatric History: recurrent depression  Past Medical History:  Past Medical History:  Diagnosis Date  . Breast cancer ([place]) 1997   right breast, radiation  . Bronchitis    recent/ 06/09/15 had chest xray/ [facility]/resolved  . Cancer [facility]) 1997   right lumpectomy,L/Ad/R   . GERD (gastroesophageal reflux disease)   . History of hiatal hernia   . Hypercholesteremia   . Hyperlipidemia   . Low BP    TYPICALLY RUNS 80'S/60'S  . Panic attack   . Personal history of malignant neoplasm of breast     Past Surgical History:  Procedure Laterality Date  . BICEPT TENODESIS Left 11/23/2016   Procedure: BICEPS TENODESIS;  Surgeon: Leim Fabry, MD;  Location: [facility];  Service: Orthopedics;  Laterality: Left;  . BREAST EXCISIONAL BIOPSY Right 1997   pos  . BREAST SURGERY Right 1997   lumpectomy  . CHOLECYSTECTOMY N/A 08/06/2016   Procedure: LAPAROSCOPIC CHOLECYSTECTOMY WITH INTRAOPERATIVE CHOLANGIOGRAM;  Surgeon: Christene Lye, MD;  Location: [facility];  Service: General;  Laterality: N/A;  . COLONOSCOPY  2008  . COLONOSCOPY WITH PROPOFOL N/A 07/21/2015   Procedure: COLONOSCOPY WITH PROPOFOL;  Surgeon: Lucilla Lame, MD;  Location: [place];  Service: Endoscopy;   Laterality: N/A;  . COLONOSCOPY WITH PROPOFOL N/A 01/05/2018   Procedure: COLONOSCOPY WITH PROPOFOL;  Surgeon: Jonathon Bellows, MD;  Location: [facility] ENDOSCOPY;  Service: Gastroenterology;  Laterality: N/A;  . ESOPHAGOGASTRODUODENOSCOPY (EGD) WITH PROPOFOL N/A 06/16/2016   Procedure: ESOPHAGOGASTRODUODENOSCOPY (EGD) WITH PROPOFOL;  Surgeon: Christene Lye, MD;  Location: [facility] ENDOSCOPY;  Service: Endoscopy;  Laterality: N/A;  . ESOPHAGOGASTRODUODENOSCOPY (EGD) WITH PROPOFOL N/A 01/05/2018   Procedure: ESOPHAGOGASTRODUODENOSCOPY (EGD) WITH PROPOFOL;  Surgeon: Jonathon Bellows, MD;  Location: [facility] ENDOSCOPY;  Service: Gastroenterology;  Laterality: N/A;  . ESOPHAGOGASTRODUODENOSCOPY (EGD) WITH PROPOFOL N/A 06/13/2018   Procedure: ESOPHAGOGASTRODUODENOSCOPY (EGD) WITH PROPOFOL;  Surgeon: Jonathon Bellows, MD;  Location: [facility] ENDOSCOPY;  Service: Gastroenterology;  Laterality: N/A;  . ESOPHAGOGASTRODUODENOSCOPY (EGD) WITH PROPOFOL N/A 07/16/2018   Procedure: ESOPHAGOGASTRODUODENOSCOPY (EGD) WITH PROPOFOL;  Surgeon: Lucilla Lame, MD;  Location: [facility] ENDOSCOPY;  Service: Endoscopy;  Laterality: N/A;  . HARDWARE REMOVAL Left 12/27/2016   Procedure: HARDWARE REMOVAL LEFT  PROXIMAL HUMEROUS;  Surgeon: Leim Fabry, MD;  Location: [facility];  Service: Orthopedics;  Laterality: Left;  . IR GJ TUBE CHANGE  04/21/2018  . NISSEN FUNDOPLICATION N/A 8/65/7846   Procedure: NISSEN FUNDOPLICATION;  Surgeon: Jules Husbands, MD;  Location: [facility];  Service: General;  Laterality: N/A;  . ORIF HUMERUS FRACTURE Left 11/23/2016   Procedure: OPEN REDUCTION INTERNAL FIXATION (ORIF) PROXIMAL HUMERUS FRACTURE;  Surgeon: Leim Fabry, MD;  Location: [facility];  Service: Orthopedics;  Laterality: Left;  . REPAIR OF ESOPHAGUS  03/09/2018   Procedure: REPAIR OF ESOPHAGUS;  Surgeon: Jules Husbands, MD;  Location: [facility];  Service: General;;  .  REVERSE SHOULDER ARTHROPLASTY Left 12/27/2016   Procedure: REVERSE SHOULDER ARTHROPLASTY;  Surgeon:  Leim Fabry, MD;  Location: [facility];  Service: Orthopedics;  Laterality: Left;  . ROBOTIC ASSISTED LAPAROSCOPIC REPAIR OF PARAESOPHAGEAL HERNIA N/A 03/09/2018   Procedure: ROBOTIC HIATAL HERNIA CONVERTED TO OPEN;  Surgeon: Jules Husbands, MD;  Location: [facility];  Service: General;  Laterality: N/A;   Family History:  Family History  Problem Relation Age of Onset  . Anxiety disorder Brother   . Obesity Brother   . COPD Brother   . Kidney disease Mother   . Heart attack Father   . Hypertension Father   . Alcohol abuse Father   . Depression Brother   . Anxiety disorder Brother   . Breast cancer Maternal Grandmother    Family Psychiatric  History: see previous Social History:  Social History   Substance and Sexual Activity  Alcohol Use No  . Alcohol/week: 0.0 standard drinks     Social History   Substance and Sexual Activity  Drug Use No    Social History   Socioeconomic History  . Marital status: Widowed    Spouse name: Not on file  . Number of children: 2  . Years of education: Not on file  . Highest education level: Bachelor's degree (e.g., BA, AB, BS)  Occupational History  . Not on file  Tobacco Use  . Smoking status: Current Some Day Smoker    Packs/day: 0.10    Years: 10.00    Pack years: 1.00    Types: Cigarettes    Start date: 11/25/2009  . Smokeless tobacco: Never Used  . Tobacco comment: 1 cigarette daily  Vaping Use  . Vaping Use: Never used  Substance and Sexual Activity  . Alcohol use: No    Alcohol/week: 0.0 standard drinks  . Drug use: No  . Sexual activity: Never  Other Topics Concern  . Not on file  Social History Narrative  . Not on file   Social Determinants of Health   Financial Resource Strain:   . Difficulty of Paying Living Expenses: Not on file  Food Insecurity:   . Worried About Charity fundraiser in the Last Year: Not on file  . Ran Out of Food in the Last Year: Not on file  Transportation Needs:   . Lack of Transportation  (Medical): Not on file  . Lack of Transportation (Non-Medical): Not on file  Physical Activity:   . Days of Exercise per Week: Not on file  . Minutes of Exercise per Session: Not on file  Stress:   . Feeling of Stress : Not on file  Social Connections:   . Frequency of Communication with Friends and Family: Not on file  . Frequency of Social Gatherings with Friends and Family: Not on file  . Attends Religious Services: Not on file  . Active Member of Clubs or Organizations: Not on file  . Attends Archivist Meetings: Not on file  . Marital Status: Not on file    Hospital Course:  No dangeraus behavior in hospital. Cooperative with treatment. Medicine changes and ECT now post 4 RUL ECT treatments with improvement.  Physical Findings: AIMS:  , ,  ,  ,    CIWA:    COWS:     Musculoskeletal: Strength & Muscle Tone: within normal limits Gait & Station: normal Patient leans: N/A  Psychiatric Specialty Exam: Physical Exam Constitutional:      Appearance: She is well-developed.  HENT:  Head: Normocephalic and atraumatic.  Eyes:     Conjunctiva/sclera: Conjunctivae normal.     Pupils: Pupils are equal, round, and reactive to light.  Cardiovascular:     Heart sounds: Normal heart sounds.  Pulmonary:     Effort: Pulmonary effort is normal.  Abdominal:     Palpations: Abdomen is soft.  Musculoskeletal:        General: Normal range of motion.     Cervical back: Normal range of motion.  Skin:    General: Skin is warm and dry.  Neurological:     General: No focal deficit present.     Mental Status: She is alert.  Psychiatric:        Mood and Affect: Mood normal.     Review of Systems  Constitutional: Negative.   HENT: Negative.   Eyes: Negative.   Respiratory: Negative.   Cardiovascular: Negative.   Gastrointestinal: Negative.   Musculoskeletal: Negative.   Skin: Negative.   Neurological: Negative.   Psychiatric/Behavioral: Negative.     Blood pressure  (!) 142/88, pulse 80, temperature 97.9 F (36.6 C), resp. rate 11, height 5\' 5"  (1.651 m), weight 54.3 kg, SpO2 98 %.Body mass index is 19.92 kg/m.  General Appearance: Casual  Eye Contact:  Good  Speech:  Clear and Coherent  Volume:  Normal  Mood:  Euthymic  Affect:  Congruent  Thought Process:  Goal Directed  Orientation:  Full (Time, Place, and Person)  Thought Content:  Logical  Suicidal Thoughts:  No  Homicidal Thoughts:  No  Memory:  Immediate;   Fair Recent;   Fair Remote;   Fair  Judgement:  Fair  Insight:  Fair  Psychomotor Activity:  Normal  Concentration:  Concentration: Fair  Recall:  [place] of Knowledge:  Fair  Language:  Fair  Akathisia:  No  Handed:  Right  AIMS (if indicated):     Assets:  Desire for Improvement Social Support  ADL's:  Intact  Cognition:  WNL  Sleep:  Number of Hours: 7     Have you used any form of tobacco in the last 30 days? (Cigarettes, Smokeless Tobacco, Cigars, and/or Pipes): Yes  Has this patient used any form of tobacco in the last 30 days? (Cigarettes, Smokeless Tobacco, Cigars, and/or Pipes) Yes, No  Blood Alcohol level:  Lab Results  Component Value Date   ETH <10 09/24/2019   ETH <10 10/17/3005    Metabolic Disorder Labs:  No results found for: HGBA1C, MPG No results found for: PROLACTIN Lab Results  Component Value Date   CHOL 164 10/02/2019   TRIG 117 10/02/2019   HDL 52 10/02/2019   CHOLHDL 3.2 10/02/2019   VLDL 23 10/02/2019   LDLCALC 89 10/02/2019   LDLCALC 87 07/27/2017    See Psychiatric Specialty Exam and Suicide Risk Assessment completed by Attending Physician prior to discharge.  Discharge destination:  Home  Is patient on multiple antipsychotic therapies at discharge:  No   Has Patient had three or more failed trials of antipsychotic monotherapy by history:  No  Recommended Plan for Multiple Antipsychotic Therapies: NA  Discharge Instructions    Diet - low sodium heart healthy   Complete  by: As directed    Increase activity slowly   Complete by: As directed      Allergies as of 10/15/2019   No Known Allergies     Medication List    STOP taking these medications   bisacodyl 5 MG EC tablet Commonly known  as: DULCOLAX   GAS-X PO   omeprazole 20 MG capsule Commonly known as: PRILOSEC Replaced by: pantoprazole 40 MG tablet   PARoxetine 20 MG tablet Commonly known as: PAXIL   simvastatin 20 MG tablet Commonly known as: ZOCOR     TAKE these medications     Indication  buPROPion 300 MG 24 hr tablet Commonly known as: WELLBUTRIN XL Take 1 tablet (300 mg total) by mouth daily.  Indication: Major Depressive Disorder   busPIRone 10 MG tablet Commonly known as: BUSPAR Take 1 tablet (10 mg total) by mouth 3 (three) times daily. What changed: when to take this  Indication: Major Depressive Disorder   docusate sodium 100 MG capsule Commonly known as: COLACE Take 1 capsule (100 mg total) by mouth 2 (two) times daily.  Indication: Constipation   linaclotide 72 MCG capsule Commonly known as: Linzess Take 1 capsule (72 mcg total) by mouth daily at 6 (six) AM. What changed: when to take this  Indication: Constipation caused by Irritable Bowel Syndrome   OLANZapine 5 MG tablet Commonly known as: ZYPREXA Take 1 tablet (5 mg total) by mouth at bedtime.  Indication: Major Depressive Disorder   pantoprazole 40 MG tablet Commonly known as: PROTONIX Take 1 tablet (40 mg total) by mouth daily. Replaces: omeprazole 20 MG capsule  Indication: Gastroesophageal Reflux Disease   polyethylene glycol 17 g packet Commonly known as: MIRALAX / GLYCOLAX Take 17 g by mouth daily.  Indication: Constipation   traZODone 100 MG tablet Commonly known as: DESYREL Take 1 tablet (100 mg total) by mouth at bedtime.  Indication: Trouble Sleeping       Follow-up Information    [facility] Follow up on 10/15/2019.   Why: PLEASE ARRIVE AT 8:00 Contact information: [place] [id number] ar [place] [phone]              Follow-up recommendations:  Activity:  as toloerated Diet:  regular Other:  return for outpt ect  Comments:  Scripts given at discharge  Signed: Alethia Berthold, MD 10/15/2019, 11:45 AM

## 2019-10-15 NOTE — Progress Notes (Signed)
Recreation Therapy Notes   Date: 10/15/2019  Time: 9:30 am   Location: Craft room     Behavioral response: N/A   Intervention Topic: Self-Esteem   Discussion/Intervention: Patient did not attend group.   Clinical Observations/Feedback:  Patient did not attend group.   Mirakle Tomlin LRT/CTRS        Akshaj Besancon 10/15/2019 11:39 AM

## 2019-10-15 NOTE — Plan of Care (Signed)
  Problem: Education: Goal: Knowledge of [facility] Education information/materials will improve Outcome: Progressing Goal: Emotional status will improve Outcome: Progressing Goal: Mental status will improve Outcome: Progressing Goal: Verbalization of understanding the information provided will improve Outcome: Progressing   Problem: Safety: Goal: Periods of time without injury will increase Outcome: Progressing   Problem: Education: Goal: Ability to make informed decisions regarding treatment will improve Outcome: Progressing   Problem: Self-Concept: Goal: Ability to disclose and discuss suicidal ideas will improve Outcome: Progressing Goal: Will verbalize positive feelings about self Outcome: Progressing   Problem: Education: Goal: Utilization of techniques to improve thought processes will improve Outcome: Progressing Goal: Knowledge of the prescribed therapeutic regimen will improve Outcome: Progressing   Problem: Activity: Goal: Interest or engagement in leisure activities will improve Outcome: Progressing Goal: Imbalance in normal sleep/wake cycle will improve Outcome: Progressing   Problem: Coping: Goal: Coping ability will improve Outcome: Progressing Goal: Will verbalize feelings Outcome: Progressing   Problem: Health Behavior/Discharge Planning: Goal: Ability to make decisions will improve Outcome: Progressing Goal: Compliance with therapeutic regimen will improve Outcome: Progressing   Problem: Role Relationship: Goal: Will demonstrate positive changes in social behaviors and relationships Outcome: Progressing   Problem: Safety: Goal: Ability to disclose and discuss suicidal ideas will improve Outcome: Progressing Goal: Ability to identify and utilize support systems that promote safety will improve Outcome: Progressing   Problem: Self-Concept: Goal: Will verbalize positive feelings about self Outcome: Progressing Goal: Level of anxiety  will decrease Outcome: Progressing   Problem: Education: Goal: Ability to state activities that reduce stress will improve Outcome: Progressing   Problem: Coping: Goal: Ability to identify and develop effective coping behavior will improve Outcome: Progressing   Problem: Self-Concept: Goal: Ability to identify factors that promote anxiety will improve Outcome: Progressing Goal: Level of anxiety will decrease Outcome: Progressing Goal: Ability to modify response to factors that promote anxiety will improve Outcome: Progressing

## 2019-10-15 NOTE — BHH Suicide Risk Assessment (Signed)
[facility] Discharge Suicide Risk Assessment   Principal Problem: Major depressive disorder, recurrent severe without psychotic features ([place]) Discharge Diagnoses: Principal Problem:   Major depressive disorder, recurrent severe without psychotic features ([place]) Active Problems:   Gastroesophageal reflux disease without esophagitis   Chronic constipation   Chronic pain syndrome   Total Time spent with patient: 30 minutes  Musculoskeletal: Strength & Muscle Tone: within normal limits Gait & Station: normal Patient leans: N/A  Psychiatric Specialty Exam: Review of Systems  Constitutional: Negative.   HENT: Negative.   Eyes: Negative.   Respiratory: Negative.   Cardiovascular: Negative.   Gastrointestinal: Negative.   Musculoskeletal: Negative.   Skin: Negative.   Neurological: Negative.   Psychiatric/Behavioral: Negative.     Blood pressure (!) 142/88, pulse 80, temperature 97.9 F (36.6 C), resp. rate 11, height 5\' 5"  (1.651 m), weight 54.3 kg, SpO2 98 %.Body mass index is 19.92 kg/m.  General Appearance: Casual  Eye Contact::  Good  Speech:  Clear and [id number]  Volume:  Normal  Mood:  Euthymic  Affect:  Congruent  Thought Process:  Goal Directed  Orientation:  Full (Time, Place, and Person)  Thought Content:  Logical  Suicidal Thoughts:  No  Homicidal Thoughts:  No  Memory:  Immediate;   Fair Recent;   Fair Remote;   Fair  Judgement:  Good  Insight:  Good  Psychomotor Activity:  Normal  Concentration:  Fair  Recall:  [employer] of Knowledge:Fair  Language: Fair  Akathisia:  No  Handed:  Right  AIMS (if indicated):     Assets:  Communication Skills  Sleep:  Number of Hours: 7  Cognition: WNL  ADL's:  Intact   Mental Status Per Nursing Assessment::   On Admission:  NA  Demographic Factors:  Caucasian  Loss Factors: NA  Historical Factors: NA  Risk Reduction Factors:   Sense of responsibility to family, Living with another person, especially a  relative, Positive social support, Positive therapeutic relationship and Positive coping skills or problem solving skills  Continued Clinical Symptoms:  Depression:   Impulsivity  Cognitive Features That Contribute To Risk:  None    Suicide Risk:  Minimal: No identifiable suicidal ideation.  Patients presenting with no risk factors but with morbid ruminations; may be classified as minimal risk based on the severity of the depressive symptoms   Follow-up Information    [facility] Follow up on 10/15/2019.   Why: PLEASE ARRIVE AT 8:00 Contact information: [place] [id number] [address] [place] [phone]              Plan Of Care/Follow-up recommendations:  Activity:  as tolerated Diet:  regular Other:  follow up outpt ect  Alethia Berthold, MD 10/15/2019, 11:42 AM

## 2019-10-15 NOTE — BHH Group Notes (Signed)
[facility] Group Notes:  (Nursing/MHT/Case Management/Adjunct)  Date:  10/15/2019  Time:  9:47 AM  Type of Therapy:  Community Meeting   Adela Lank [facility] 10/15/2019, 9:47 AM

## 2019-10-15 NOTE — Progress Notes (Signed)
Recreation Therapy Notes  INPATIENT RECREATION TR PLAN  Patient Details Name: Chelsey [name] MRN: [id number] DOB: [birth year] Today's Date: 10/15/2019  Rec Therapy Plan Is patient appropriate for Therapeutic Recreation?: Yes Treatment times per week: at least 3 Estimated Length of Stay: 5-7 days TR Treatment/Interventions: Group participation (Comment)  Discharge Criteria Pt will be discharged from therapy if:: Discharged Treatment plan/goals/alternatives discussed and agreed upon by:: Patient/family  Discharge Summary Short term goals set: Patient will engage in groups without prompting or encouragement from LRT x3 group sessions within 5 recreation therapy group sessions Short term goals met: Complete Progress toward goals comments: Groups attended Which groups?: Communication, Goal setting, Stress management, Other (Comment) (Relaxation) Reason goals not met: N/A Therapeutic equipment acquired: N/A Reason patient discharged from therapy: Discharge from hospital Pt/family agrees with progress & goals achieved: Yes Date patient discharged from therapy: 10/15/19   [name] 10/15/2019, 12:34 PM

## 2019-10-15 NOTE — Transfer of Care (Signed)
Immediate Anesthesia Transfer of Care Note  Patient: Chelsey [name]  Procedure(s) Performed: ECT TX  Patient Location: PACU  Anesthesia Type:General  Level of Consciousness: awake  Airway & Oxygen Therapy: Patient Spontanous Breathing and Patient connected to face mask oxygen  Post-op Assessment: Report given to RN and Post -op Vital signs reviewed and stable  Post vital signs: Reviewed  Last Vitals:  Vitals Value Taken Time  BP    Temp    Pulse 89 10/15/19 1033  Resp    SpO2 100 % 10/15/19 1033  Vitals shown include unvalidated device data.  Last Pain:  Vitals:   10/15/19 1023  TempSrc:   PainSc: 0-No pain      Patients Stated Pain Goal: 0 (94/99/71 8209)  Complications: No complications documented.

## 2019-10-15 NOTE — Progress Notes (Signed)
  [facility] Adult Case Management Discharge Plan :  Will you be returning to the same living situation after discharge:  Yes,  return home with son. At discharge, do you have transportation home?: Yes,  patient will be transported by sister, Florestine Avers, at time of discharge. Do you have the ability to pay for your medications: Yes,  patient has active coverage via medicare.  Release of information consent forms completed and in the chart;  Patient's signature needed at discharge.  Patient to Follow up at:  Follow-up Information    [facility] Follow up on 10/15/2019.   Why: PLEASE ARRIVE AT 8:00 Contact information: [place] [id number] ar [place] [phone]       [facility] Follow up on 10/17/2019.   Why: You are scheduled for a virtural appointment on Wednesday, September 22nd at 10:00am.  Someone from [facility] will contact you at [phone]. Contact information: [place]  [place] [address] [phone]               Next level of care provider has access to [place] and Suicide Prevention discussed: Yes,  SPE reviewed with son, Aliannah Holstrom.  Have you used any form of tobacco in the last 30 days? (Cigarettes, Smokeless Tobacco, Cigars, and/or Pipes): Yes  Has patient been referred to the Quitline?: Patient refused referral  Patient has been referred for addiction treatment: Yes  Blane Ohara, LCSW 10/15/2019, 12:32 PM

## 2019-10-15 NOTE — Anesthesia Postprocedure Evaluation (Signed)
Anesthesia Post Note  Patient: Chelsey [name]  Procedure(s) Performed: ECT TX  Patient location during evaluation: PACU Anesthesia Type: General Level of consciousness: awake and alert Pain management: pain level controlled Vital Signs Assessment: post-procedure vital signs reviewed and stable Respiratory status: spontaneous breathing, nonlabored ventilation and respiratory function stable Cardiovascular status: blood pressure returned to baseline and stable Postop Assessment: no apparent nausea or vomiting Anesthetic complications: no   No complications documented.   Last Vitals:  Vitals:   10/15/19 1043 10/15/19 1053  BP: (!) 142/89 (!) 142/88  Pulse: 84 80  Resp: 16 11  Temp:    SpO2: 98% 98%    Last Pain:  Vitals:   10/15/19 1100  TempSrc:   PainSc: 0-No pain                 Brett Canales Mehtab Dolberry

## 2019-10-15 NOTE — Procedures (Signed)
[facility] Physician's Interval Evaluation & Treatment Note  Patient Identification: Chelsey [name] MRN:  [id number] Date of Evaluation:  10/15/2019 TX #: 4  MADRS:   MMSE:   P.E. Findings:  No change physical exam.  Still has a left foot drop but otherwise looks good  Psychiatric Interval Note:  Mood is clearly improved  Subjective:  Patient is a 69 y.o. female seen for evaluation for Electroconvulsive Therapy. Patient is aware that her mood and affect are brighter  Treatment Summary:   [x]   Right Unilateral             []  Bilateral   % Energy : 0.3 ms 100%   Impedance: 2580 ohms  Seizure Energy Index: 9945 V squared  Postictal Suppression Index: 94%  Seizure Concordance Index: 94%  Medications  Pre Shock: Ketamine 80 mg succinylcholine 80 mg  Post Shock:    Seizure Duration: 10 seconds EMG 14 seconds EEG   Comments: Very short seizure and I am not sure what else we can do since we are already using ketamine.  She is being discharged today and we hope to see her back on Wednesday after that we will probably switch to maintenance if possible  Lungs:  [x]   Clear to auscultation               []  Other:   Heart:    [x]   Regular rhythm             []  irregular rhythm    [x]   Previous H&P reviewed, patient examined and there are NO CHANGES                 []   Previous H&P reviewed, patient examined and there are changes noted.   Chelsey Berthold, MD 9/20/20211:03 PM

## 2019-10-15 NOTE — Plan of Care (Signed)
  Problem: Group Participation Goal: STG - Patient will engage in groups without prompting or encouragement from LRT x3 group sessions within 5 recreation therapy group sessions Description: STG - Patient will engage in groups without prompting or encouragement from LRT x3 group sessions within 5 recreation therapy group sessions Outcome: Completed/Met

## 2019-10-15 NOTE — Progress Notes (Signed)
Patient denies SI/HI, denies A/V hallucinations. Patient verbalizes understanding of discharge instructions, follow up care and prescriptions. Patient given all belongings from [facility] locker. Patient escorted out by staff, transported by family. 

## 2019-10-15 NOTE — Progress Notes (Signed)
Patient is alert and oriented x 4. She is pleasant and cooperative and easy to engage in conversation. She denies SI/HI/AVH depression and anxiety at this encounter. She is active on the unit watching TV and interacts well with others. She is med compliant and tolerates med s without incident. She is safe on the unit with 15 minute safety checks and informed to contact staff with any concerns.     Cleo Butler-Nicholson, LPN

## 2019-10-15 NOTE — [facility] Group Notes (Signed)
[facility] Group Notes:  (Nursing/MHT/Case Management/Adjunct)  Date:  10/15/2019  Time:  5:26 PM  Type of Therapy:  Psychoeducational Skills  Participation Level:  Minimal  Participation Quality:  Drowsy  Affect: Flat Cognitive:  Oriented  Insight:  Limited  Engagement in Group:  Limited  Modes of Intervention:  Education  Summary of Progress/Problems:  Chelsey [name] 10/15/2019, 5:26 PM

## 2019-10-15 NOTE — Anesthesia Preprocedure Evaluation (Signed)
Anesthesia Evaluation  Patient identified by MRN, date of birth, ID band Patient awake    Reviewed: Allergy & Precautions, NPO status , Patient's Chart, lab work & pertinent test results  History of Anesthesia Complications Negative for: history of anesthetic complications  Airway Mallampati: II  TM Distance: >3 FB Neck ROM: Full    Dental  (+) Poor Dentition   Pulmonary neg sleep apnea, neg COPD, Current SmokerPatient did not abstain from smoking.,    breath sounds clear to auscultation- rhonchi (-) wheezing      Cardiovascular (-) hypertension(-) CAD, (-) Past MI, (-) Cardiac Stents and (-) CABG  Rhythm:Regular Rate:Normal - Systolic murmurs and - Diastolic murmurs    Neuro/Psych neg Seizures PSYCHIATRIC DISORDERS Anxiety Depression negative neurological ROS     GI/Hepatic Neg liver ROS, hiatal hernia, GERD  ,  Endo/Other  negative endocrine ROSneg diabetes  Renal/GU negative Renal ROS     Musculoskeletal negative musculoskeletal ROS (+)   Abdominal (+) - obese,   Peds  Hematology  (+) Blood dyscrasia, anemia ,   Anesthesia Other Findings Past Medical History: 1997: Breast cancer ([place])     Comment:  right breast, radiation No date: Bronchitis     Comment:  recent/ 06/09/15 [facility] x[facility]/ [facility]               care/resolved 1997: Cancer [facility])     Comment:  right lumpectomy,L/Ad/R  No date: GERD (gastroesophageal reflux disease) No date: History of hiatal hernia No date: Hypercholesteremia No date: Hyperlipidemia No date: Low BP     Comment:  TYPICALLY RUNS 80'S/60'S No date: Panic attack No date: Personal history of malignant neoplasm of breast   Reproductive/Obstetrics                             Anesthesia Physical  Anesthesia Plan  ASA: II  Anesthesia Plan: General   Post-op Pain Management:    Induction: Intravenous  PONV Risk Score and Plan:   Airway  Management Planned: Mask  Additional Equipment:   Intra-op Plan:   Post-operative Plan:   Informed Consent: I have reviewed the patients History and Physical, chart, labs and discussed the procedure including the risks, benefits and alternatives for the proposed anesthesia with the patient or authorized representative who has indicated his/her understanding and acceptance.     Dental advisory given  Plan Discussed with: CRNA and Anesthesiologist  Anesthesia Plan Comments:         Anesthesia Quick Evaluation

## 2019-10-15 NOTE — H&P (Signed)
Chelsey [name] is an 69 y.o. female.   Chief Complaint: No specific complaint HPI: Recurrent severe depression improved with ECT  Past Medical History:  Diagnosis Date  . Breast cancer ([place]) 1997   right breast, radiation  . Bronchitis    recent/ 06/09/15 had chest xray/ [facility]/resolved  . Cancer [facility]) 1997   right lumpectomy,L/Ad/R   . GERD (gastroesophageal reflux disease)   . History of hiatal hernia   . Hypercholesteremia   . Hyperlipidemia   . Low BP    TYPICALLY RUNS 80'S/60'S  . Panic attack   . Personal history of malignant neoplasm of breast     Past Surgical History:  Procedure Laterality Date  . BICEPT TENODESIS Left 11/23/2016   Procedure: BICEPS TENODESIS;  Surgeon: Leim Fabry, MD;  Location: [facility] ORS;  Service: Orthopedics;  Laterality: Left;  . BREAST EXCISIONAL BIOPSY Right 1997   pos  . BREAST SURGERY Right 1997   lumpectomy  . CHOLECYSTECTOMY N/A 08/06/2016   Procedure: LAPAROSCOPIC CHOLECYSTECTOMY WITH INTRAOPERATIVE CHOLANGIOGRAM;  Surgeon: Christene Lye, MD;  Location: [facility] ORS;  Service: General;  Laterality: N/A;  . COLONOSCOPY  2008  . COLONOSCOPY WITH PROPOFOL N/A 07/21/2015   Procedure: COLONOSCOPY WITH PROPOFOL;  Surgeon: Lucilla Lame, MD;  Location: [facility];  Service: Endoscopy;  Laterality: N/A;  . COLONOSCOPY WITH PROPOFOL N/A 01/05/2018   Procedure: COLONOSCOPY WITH PROPOFOL;  Surgeon: Jonathon Bellows, MD;  Location: [facility] ENDOSCOPY;  Service: Gastroenterology;  Laterality: N/A;  . ESOPHAGOGASTRODUODENOSCOPY (EGD) WITH PROPOFOL N/A 06/16/2016   Procedure: ESOPHAGOGASTRODUODENOSCOPY (EGD) WITH PROPOFOL;  Surgeon: Christene Lye, MD;  Location: [facility] ENDOSCOPY;  Service: Endoscopy;  Laterality: N/A;  . ESOPHAGOGASTRODUODENOSCOPY (EGD) WITH PROPOFOL N/A 01/05/2018   Procedure: ESOPHAGOGASTRODUODENOSCOPY (EGD) WITH PROPOFOL;  Surgeon: Jonathon Bellows, MD;  Location[facility]COPY;  Service: Gastroenterology;   Laterality: N/A;  . ESOPHAGOGASTRODUODENOSCOPY (EGD) WITH PROPOFOL N/A 06/13/2018   Procedure: ESOPHAGOGASTRODUODENOSCOPY (EGD) WITH PROPOFOL;  Surgeon: Jonathon Bellows, MD;  Location: [facility] ENDOSCOPY;  Service: Gastroenterology;  Laterality: N/A;  . ESOPHAGOGASTRODUODENOSCOPY (EGD) WITH PROPOFOL N/A 07/16/2018   Procedure: ESOPHAGOGASTRODUODENOSCOPY (EGD) WITH PROPOFOL;  Surgeon: Lucilla Lame, MD;  Location: [facility] ENDOSCOPY;  Service: Endoscopy;  Laterality: N/A;  . HARDWARE REMOVAL Left 12/27/2016   Procedure: HARDWARE REMOVAL LEFT  PROXIMAL HUMEROUS;  Surgeon: Leim Fabry, MD;  Location: [facility] ORS;  Service: Orthopedics;  Laterality: Left;  . IR GJ TUBE CHANGE  04/21/2018  . NISSEN FUNDOPLICATION N/A 9/38/1017   Procedure: NISSEN FUNDOPLICATION;  Surgeon: Jules Husbands, MD;  Location: [facility] ORS;  Service: General;  Laterality: N/A;  . ORIF HUMERUS FRACTURE Left 11/23/2016   Procedure: OPEN REDUCTION INTERNAL FIXATION (ORIF) PROXIMAL HUMERUS FRACTURE;  Surgeon: Leim Fabry, MD;  Location: [facility] ORS;  Service: Orthopedics;  Laterality: Left;  . REPAIR OF ESOPHAGUS  03/09/2018   Procedure: REPAIR OF ESOPHAGUS;  Surgeon: Jules Husbands, MD;  Location: [facility] ORS;  Service: General;;  . REVERSE SHOULDER ARTHROPLASTY Left 12/27/2016   Procedure: REVERSE SHOULDER ARTHROPLASTY;  Surgeon: Leim Fabry, MD;  Location: [facility] ORS;  Service: Orthopedics;  Laterality: Left;  . ROBOTIC ASSISTED LAPAROSCOPIC REPAIR OF PARAESOPHAGEAL HERNIA N/A 03/09/2018   Procedure: ROBOTIC HIATAL HERNIA CONVERTED TO OPEN;  Surgeon: Jules Husbands, MD;  Location: [facility] ORS;  Service: General;  Laterality: N/A;    Family History  Problem Relation Age of Onset  . Anxiety disorder Brother   . Obesity Brother   . COPD Brother   . Kidney disease Mother   .  Heart attack Father   . Hypertension Father   . Alcohol abuse Father   . Depression Brother   . Anxiety disorder Brother   . Breast cancer Maternal Grandmother    Social History:   reports that she has been smoking cigarettes. She started smoking about 9 years ago. She has a 1.00 pack-year smoking history. She has never used smokeless tobacco. She reports that she does not drink alcohol and does not use drugs.  Allergies: No Known Allergies  Medications Prior to Admission  Medication Sig Dispense Refill  . bisacodyl (DULCOLAX) 5 MG EC tablet Take 1 tablet (5 mg total) by mouth daily. 30 tablet 0  . busPIRone (BUSPAR) 10 MG tablet Take 1 tablet (10 mg total) by mouth 2 (two) times daily. 180 tablet 1  . linaclotide (LINZESS) 72 MCG capsule Take 1 capsule (72 mcg total) by mouth daily before breakfast. 30 capsule 2  . omeprazole (PRILOSEC) 20 MG capsule Take 1 capsule (20 mg total) by mouth 2 (two) times daily before a meal. 180 capsule 3  . PARoxetine (PAXIL) 20 MG tablet Take 1 tablet (20 mg total) by mouth daily before supper. 90 tablet 1  . polyethylene glycol (MIRALAX / GLYCOLAX) 17 g packet Take 17 g by mouth daily. 14 each 0  . Simethicone (GAS-X PO) Take 1-2 tablets by mouth daily as needed (gas).    . simvastatin (ZOCOR) 20 MG tablet TAKE ONE TABLET BY MOUTH AT BEDTIME 90 tablet 1  . [DISCONTINUED] traZODone (DESYREL) 100 MG tablet Take 1 tablet (100 mg total) by mouth at bedtime. 90 tablet 1    No results found for this or any previous visit (from the past 48 hour(s)). No results found.  Review of Systems  Constitutional: Negative.   HENT: Negative.   Eyes: Negative.   Respiratory: Negative.   Cardiovascular: Negative.   Gastrointestinal: Negative.   Musculoskeletal: Negative.   Skin: Negative.   Neurological: Negative.   Psychiatric/Behavioral: Negative.     Blood pressure 114/76, pulse 76, temperature 97.6 F (36.4 C), temperature source Oral, resp. rate 14, height 5\' 5"  (1.651 m), weight 54.3 kg, SpO2 100 %. Physical Exam Vitals and nursing note reviewed.  Constitutional:      Appearance: She is well-developed.  HENT:     Head: Normocephalic  and atraumatic.  Eyes:     Conjunctiva/sclera: Conjunctivae normal.     Pupils: Pupils are equal, round, and reactive to light.  Cardiovascular:     Heart sounds: Normal heart sounds.  Pulmonary:     Effort: Pulmonary effort is normal.  Abdominal:     Palpations: Abdomen is soft.  Musculoskeletal:        General: Normal range of motion.     Cervical back: Normal range of motion.  Skin:    General: Skin is warm and dry.  Neurological:     General: No focal deficit present.     Mental Status: She is alert.  Psychiatric:        Mood and Affect: Mood normal.      Assessment/Plan Treatment today followed by discharge this afternoon  Alethia Berthold, MD 10/15/2019, 9:22 AM

## 2019-10-15 NOTE — [facility] Group Notes (Signed)
  [facility] LCSW Group Therapy Note  Date/Time:  10/15/2019 1:15PM  Type of Therapy and Topic:  Group Therapy:  Feelings About Hospitalization  Participation Level:  Minimal   Description of Group This process group involved patients discussing their feelings related to being hospitalized, as well as the benefits they see to being in the hospital.  These feelings and benefits were itemized.  The group then brainstormed specific ways in which they could seek those same benefits when they discharge and return home.  Therapeutic Goals 1. Patient will identify and describe positive and negative feelings related to hospitalization 2. Patient will verbalize benefits of hospitalization to themselves personally 3. Patients will brainstorm together ways they can obtain similar benefits in the outpatient setting, identify barriers to wellness and possible solutions  Summary of Patient Progress:  The patient was not initially present at group opening. Pt proved to join midway through duration of group. Pt actively participated in discussion surrounding how similar benefits identified by group members can be secured on an outpatient basis. Pt proved receptive to following through with discharge plan and ensuring follow up care be adhered to. Pt proved receptive to alternate group members input and feedback from [place].   Therapeutic Modalities Cognitive Behavioral Therapy Motivational Interviewing    Blane Ohara, [place] 10/15/2019  2:39 PM

## 2019-10-16 ENCOUNTER — Other Ambulatory Visit: Payer: Self-pay | Admitting: Psychiatry

## 2019-11-06 ENCOUNTER — Other Ambulatory Visit: Payer: Self-pay | Admitting: Psychiatry

## 2019-12-28 ENCOUNTER — Telehealth: Payer: Self-pay

## 2019-12-28 ENCOUNTER — Encounter: Payer: Self-pay | Admitting: Nurse Practitioner

## 2019-12-28 NOTE — Telephone Encounter (Signed)
Called pt to r/s 12/17 appt no answer left vm

## 2020-01-11 ENCOUNTER — Encounter: Payer: Federal, State, Local not specified - PPO | Admitting: Family Medicine

## 2020-01-11 ENCOUNTER — Encounter: Payer: Federal, State, Local not specified - PPO | Admitting: Nurse Practitioner

## 2020-01-19 ENCOUNTER — Encounter: Payer: Self-pay | Admitting: Nurse Practitioner

## 2020-01-19 DIAGNOSIS — I700 Atherosclerosis of aorta: Secondary | ICD-10-CM | POA: Insufficient documentation

## 2020-01-21 ENCOUNTER — Encounter: Payer: Self-pay | Admitting: Nurse Practitioner

## 2020-01-21 ENCOUNTER — Ambulatory Visit (INDEPENDENT_AMBULATORY_CARE_PROVIDER_SITE_OTHER): Payer: Medicare Other | Admitting: Nurse Practitioner

## 2020-01-21 ENCOUNTER — Other Ambulatory Visit: Payer: Self-pay

## 2020-01-21 VITALS — BP 100/69 | HR 94 | Temp 98.2°F | Ht 65.16 in | Wt 168.4 lb

## 2020-01-21 DIAGNOSIS — Z136 Encounter for screening for cardiovascular disorders: Secondary | ICD-10-CM | POA: Diagnosis not present

## 2020-01-21 DIAGNOSIS — Z1231 Encounter for screening mammogram for malignant neoplasm of breast: Secondary | ICD-10-CM | POA: Diagnosis not present

## 2020-01-21 DIAGNOSIS — I700 Atherosclerosis of aorta: Secondary | ICD-10-CM | POA: Diagnosis not present

## 2020-01-21 DIAGNOSIS — M85851 Other specified disorders of bone density and structure, right thigh: Secondary | ICD-10-CM

## 2020-01-21 DIAGNOSIS — Z0000 Encounter for general adult medical examination without abnormal findings: Secondary | ICD-10-CM

## 2020-01-21 DIAGNOSIS — Z853 Personal history of malignant neoplasm of breast: Secondary | ICD-10-CM

## 2020-01-21 DIAGNOSIS — Z23 Encounter for immunization: Secondary | ICD-10-CM

## 2020-01-21 DIAGNOSIS — F332 Major depressive disorder, recurrent severe without psychotic features: Secondary | ICD-10-CM | POA: Diagnosis not present

## 2020-01-21 DIAGNOSIS — R5383 Other fatigue: Secondary | ICD-10-CM | POA: Diagnosis not present

## 2020-01-21 DIAGNOSIS — F17210 Nicotine dependence, cigarettes, uncomplicated: Secondary | ICD-10-CM

## 2020-01-21 DIAGNOSIS — Z13220 Encounter for screening for lipoid disorders: Secondary | ICD-10-CM | POA: Diagnosis not present

## 2020-01-21 DIAGNOSIS — Z1329 Encounter for screening for other suspected endocrine disorder: Secondary | ICD-10-CM

## 2020-01-21 DIAGNOSIS — F5101 Primary insomnia: Secondary | ICD-10-CM

## 2020-01-21 MED ORDER — BUPROPION HCL ER (XL) 300 MG PO TB24
300.0000 mg | ORAL_TABLET | Freq: Every day | ORAL | 4 refills | Status: DC
Start: 2020-01-21 — End: 2020-06-24

## 2020-01-21 MED ORDER — OLANZAPINE 5 MG PO TABS
5.0000 mg | ORAL_TABLET | Freq: Every day | ORAL | 4 refills | Status: DC
Start: 2020-01-21 — End: 2020-06-24

## 2020-01-21 MED ORDER — OLANZAPINE 5 MG PO TABS
5.0000 mg | ORAL_TABLET | Freq: Every day | ORAL | 4 refills | Status: DC
Start: 2020-01-21 — End: 2020-01-21

## 2020-01-21 MED ORDER — BUSPIRONE HCL 10 MG PO TABS
10.0000 mg | ORAL_TABLET | Freq: Three times a day (TID) | ORAL | 4 refills | Status: DC
Start: 2020-01-21 — End: 2020-06-24

## 2020-01-21 MED ORDER — PANTOPRAZOLE SODIUM 40 MG PO TBEC
40.0000 mg | DELAYED_RELEASE_TABLET | Freq: Every day | ORAL | 4 refills | Status: DC
Start: 2020-01-21 — End: 2020-06-24

## 2020-01-21 MED ORDER — TRAZODONE HCL 300 MG PO TABS: 300.0000 mg | ORAL_TABLET | Freq: Every day | ORAL | 4 refills | Status: DC

## 2020-01-21 MED ORDER — DOCUSATE SODIUM 100 MG PO CAPS
100.0000 mg | ORAL_CAPSULE | Freq: Two times a day (BID) | ORAL | 4 refills | Status: DC
Start: 2020-01-21 — End: 2020-01-21

## 2020-01-21 MED ORDER — DOCUSATE SODIUM 100 MG PO CAPS
100.0000 mg | ORAL_CAPSULE | Freq: Two times a day (BID) | ORAL | 4 refills | Status: DC
Start: 2020-01-21 — End: 2020-06-24

## 2020-01-21 NOTE — Assessment & Plan Note (Signed)
Noted on imaging 09/24/19.  Recommend continue smoking cessation and will consider statin addition if elevation in cholesterol levels.

## 2020-01-21 NOTE — Assessment & Plan Note (Signed)
Noted on DEXA 08/09/17.  Recommend continue ensuring adequate calcium intake daily and add on a Vitamin D3 supplement.  Check Vitamin D3 level today and plan on repeat DEXA in 2024.

## 2020-01-21 NOTE — Assessment & Plan Note (Signed)
Quit 2-3 months ago.  I have recommended complete cessation of tobacco use. I have discussed various options available for assistance with tobacco cessation including over the counter methods (Nicotine gum, patch and lozenges). We also discussed prescription options (Chantix, Nicotine Inhaler / Nasal Spray). The patient is not interested in pursuing any prescription tobacco cessation options at this time.

## 2020-01-21 NOTE — Assessment & Plan Note (Signed)
Chronic, ongoing issue.  Will increase Trazodone to max dose 300 MG QHS.  Referral to local psychiatry group placed to decrease travel time for patient.  Would benefit from ongoing psychiatric care due to lengthy mental health history.

## 2020-01-21 NOTE — Assessment & Plan Note (Signed)
Mammogram ordered today 

## 2020-01-21 NOTE — Patient Instructions (Signed)
[facility]  Address: [address], [place], [place] [address]  Phone: [phone]  Healthy Eating Following a healthy eating pattern may help you to achieve and maintain a healthy body weight, reduce the risk of chronic disease, and live a long and productive life. It is important to follow a healthy eating pattern at an appropriate calorie level for your body. Your nutritional needs should be met primarily through food by choosing a variety of nutrient-rich foods. What are tips for following this plan? Reading food labels  Read labels and choose the following: ? Reduced or low sodium. ? Juices with 100% fruit juice. ? Foods with low saturated fats and high polyunsaturated and monounsaturated fats. ? Foods with whole grains, such as whole wheat, cracked wheat, brown rice, and wild rice. ? Whole grains that are fortified with folic acid. This is recommended for women who are pregnant or who want to become pregnant.  Read labels and avoid the following: ? Foods with a lot of added sugars. These include foods that contain brown sugar, corn sweetener, corn syrup, dextrose, fructose, glucose, high-fructose corn syrup, honey, invert sugar, lactose, malt syrup, maltose, molasses, raw sugar, sucrose, trehalose, or turbinado sugar.  Do not eat more than the following amounts of added sugar per day:  6 teaspoons (25 g) for women.  9 teaspoons (38 g) for men. ? Foods that contain processed or refined starches and grains. ? Refined grain products, such as white flour, degermed cornmeal, white bread, and white rice. Shopping  Choose nutrient-rich snacks, such as vegetables, whole fruits, and nuts. Avoid high-calorie and high-sugar snacks, such as potato chips, fruit snacks, and candy.  Use oil-based dressings and spreads on foods instead of solid fats such as butter, stick margarine, or cream cheese.  Limit pre-made sauces, mixes, and "instant" products  such as flavored rice, instant noodles, and ready-made pasta.  Try more plant-protein sources, such as tofu, tempeh, black beans, edamame, lentils, nuts, and seeds.  Explore eating plans such as the Mediterranean diet or vegetarian diet. Cooking  Use oil to saut or stir-fry foods instead of solid fats such as butter, stick margarine, or lard.  Try baking, boiling, grilling, or broiling instead of frying.  Remove the fatty part of meats before cooking.  Steam vegetables in water or broth. Meal planning   At meals, imagine dividing your plate into fourths: ? One-half of your plate is fruits and vegetables. ? One-fourth of your plate is whole grains. ? One-fourth of your plate is protein, especially lean meats, poultry, eggs, tofu, beans, or nuts.  Include low-fat dairy as part of your daily diet. Lifestyle  Choose healthy options in all settings, including home, work, school, restaurants, or stores.  Prepare your food safely: ? Wash your hands after handling raw meats. ? Keep food preparation surfaces clean by regularly washing with hot, soapy water. ? Keep raw meats separate from ready-to-eat foods, such as fruits and vegetables. ? Cook seafood, meat, poultry, and eggs to the recommended internal temperature. ? Store foods at safe temperatures. In general:  Keep cold foods at 40F (4.4C) or below.  Keep hot foods at 140F (60C) or above.  Keep your freezer at 0F (-17.8C) or below.  Foods are no longer safe to eat when they have been between the temperatures of 40-140F (4.4-60C) for more than 2 hours. What foods should I eat? Fruits Aim to eat 2 cup-equivalents of fresh, canned (in natural juice), or frozen fruits   fruits each day. Examples of 1 cup-equivalent of fruit include 1 small apple, 8 large strawberries, 1 cup canned fruit,  cup dried fruit, or 1 cup 100% juice. Vegetables Aim to eat 2-3 cup-equivalents of fresh and frozen vegetables each day, including different  varieties and colors. Examples of 1 cup-equivalent of vegetables include 2 medium carrots, 2 cups raw, leafy greens, 1 cup chopped vegetable (raw or cooked), or 1 medium baked potato. Grains Aim to eat 6 ounce-equivalents of whole grains each day. Examples of 1 ounce-equivalent of grains include 1 slice of bread, 1 cup ready-to-eat cereal, 3 cups popcorn, or  cup cooked rice, pasta, or cereal. Meats and other proteins Aim to eat 5-6 ounce-equivalents of protein each day. Examples of 1 ounce-equivalent of protein include 1 egg, 1/2 cup nuts or seeds, or 1 tablespoon (16 g) peanut butter. A cut of meat or fish that is the size of a deck of cards is about 3-4 ounce-equivalents.  Of the protein you eat each week, try to have at least 8 ounces come from seafood. This includes salmon, trout, herring, and anchovies. Dairy Aim to eat 3 cup-equivalents of fat-free or low-fat dairy each day. Examples of 1 cup-equivalent of dairy include 1 cup (240 mL) milk, 8 ounces (250 g) yogurt, 1 ounces (44 g) natural cheese, or 1 cup (240 mL) fortified soy milk. Fats and oils  Aim for about 5 teaspoons (21 g) per day. Choose monounsaturated fats, such as canola and olive oils, avocados, peanut butter, and most nuts, or polyunsaturated fats, such as sunflower, corn, and soybean oils, walnuts, pine nuts, sesame seeds, sunflower seeds, and flaxseed. Beverages  Aim for six 8-oz glasses of water per day. Limit coffee to three to five 8-oz cups per day.  Limit caffeinated beverages that have added calories, such as soda and energy drinks.  Limit alcohol intake to no more than 1 drink a day for nonpregnant women and 2 drinks a day for men. One drink equals 12 oz of beer (355 mL), 5 oz of wine (148 mL), or 1 oz of hard liquor (44 mL). Seasoning and other foods  Avoid adding excess amounts of salt to your foods. Try flavoring foods with herbs and spices instead of salt.  Avoid adding sugar to foods.  Try using  oil-based dressings, sauces, and spreads instead of solid fats. This information is based on general [place] nutrition guidelines. For more information, visit [employer]es. Exact amounts may vary based on your nutrition needs. Summary  A healthy eating plan may help you to maintain a healthy weight, reduce the risk of chronic diseases, and stay active throughout your life.  Plan your meals. Make sure you eat the right portions of a variety of nutrient-rich foods.  Try baking, boiling, grilling, or broiling instead of frying.  Choose healthy options in all settings, including home, work, school, restaurants, or stores. This information is not intended to replace advice given to you by your health care provider. Make sure you discuss any questions you have with your health care provider. Document Revised: 04/25/2017 Document Reviewed: 04/25/2017 Elsevier Patient Education  [employer].

## 2020-01-21 NOTE — Progress Notes (Signed)
BP 100/69   Pulse 94   Temp 98.2 F (36.8 C)   Ht 5' 5.16" (1.655 m)   Wt 168 lb 6 oz (76.4 kg)   LMP  (LMP Unknown)   SpO2 97%   BMI 27.88 kg/m    Subjective:    Patient ID: Chelsey [name], female    DOB: [birth year], 69 y.o.   MRN: [id number]  HPI: Chelsey [name] is a 69 y.o. female presenting on 01/21/2020 for comprehensive medical examination. Current medical complaints include:none  She currently lives with: lives with son Menopausal Symptoms: no   DEPRESSION Continues on Trazodone 100 MG for insomnia (sometimes is taking 200 MG), Buspar 10 MG TID, Wellbutrin 300 MG daily, Olanzapine 5 MG daily at bedtime.  Was seeing [facility] psychiatry in [place], but this is too far for her to drive and would like to go local.  Reports Trazodone at 200 MG is not helping a lot with sleep -- she would like something stronger.  Did trial ECT while in hospital in September and this was beneficial for mood she reports.  She is a past smoker -- smoked for 10 years and quit 2-3 months ago.  Aortic atherosclerosis noted on CT scan 09/24/19. Mood status: stable Satisfied with current treatment?: yes Symptom severity: moderate  Duration of current treatment : chronic Side effects: no Medication compliance: poor compliance Psychotherapy/counseling: in the past Previous psychiatric medications: multiple different medications Depressed mood: no Anxious mood: no Anhedonia: no Significant weight loss or gain: no Insomnia: yes hard to fall asleep Fatigue: no Feelings of worthlessness or guilt: no Impaired concentration/indecisiveness: no Suicidal ideations: no Hopelessness: no Crying spells: no Depression screen [facility] 2/9 01/21/2020 07/12/2019 07/27/2017 08/17/2016 07/05/2016  Decreased Interest 0 1 1 0 0  Down, Depressed, Hopeless 0 0 1 1 0  PHQ - 2 Score 0 1 2 1  0  Altered sleeping 3 0 0 1 0  Tired, decreased energy 1 1 1 1  0  Change in appetite - 1 1 0 0  Feeling bad or failure about  yourself  0 0 1 0 0  Trouble concentrating 0 0 0 1 0  Moving slowly or fidgety/restless 0 0 0 0 0  Suicidal thoughts 0 0 1 0 0  PHQ-9 Score 4 3 6 4  0  Difficult doing work/chores Not difficult at all - - - -  Some recent data might be hidden    The patient does not have a history of falls. I did not complete a risk assessment for falls. A plan of care for falls was not documented.   Past Medical History:  Past Medical History:  Diagnosis Date  . Breast cancer ([place]) 1997   right breast, radiation  . Bronchitis    recent/ 06/09/15 had chest xray/ [facility]/resolved  . Cancer [facility]) 1997   right lumpectomy,L/Ad/R   . GERD (gastroesophageal reflux disease)   . History of hiatal hernia   . Hypercholesteremia   . Hyperlipidemia   . Low BP    TYPICALLY RUNS 80'S/60'S  . Panic attack   . Personal history of malignant neoplasm of breast     Surgical History:  Past Surgical History:  Procedure Laterality Date  . BICEPT TENODESIS Left 11/23/2016   Procedure: BICEPS TENODESIS;  Surgeon: Leim Fabry, MD;  Location: [facility] ORS;  Service: Orthopedics;  Laterality: Left;  . BREAST EXCISIONAL BIOPSY Right 1997   pos  . BREAST SURGERY Right 1997   lumpectomy  . CHOLECYSTECTOMY  N/A 08/06/2016   Procedure: LAPAROSCOPIC CHOLECYSTECTOMY WITH INTRAOPERATIVE CHOLANGIOGRAM;  Surgeon: Christene Lye, MD;  Location: [facility];  Service: General;  Laterality: N/A;  . COLONOSCOPY  2008  . COLONOSCOPY WITH PROPOFOL N/A 07/21/2015   Procedure: COLONOSCOPY WITH PROPOFOL;  Surgeon: Lucilla Lame, MD;  Location: [place];  Service: Endoscopy;  Laterality: N/A;  . COLONOSCOPY WITH PROPOFOL N/A 01/05/2018   Procedure: COLONOSCOPY WITH PROPOFOL;  Surgeon: Jonathon Bellows, MD;  Location: [facility] ENDOSCOPY;  Service: Gastroenterology;  Laterality: N/A;  . ESOPHAGOGASTRODUODENOSCOPY (EGD) WITH PROPOFOL N/A 06/16/2016   Procedure: ESOPHAGOGASTRODUODENOSCOPY (EGD) WITH PROPOFOL;  Surgeon: Christene Lye, MD;  Location: [facility] ENDOSCOPY;  Service: Endoscopy;  Laterality: N/A;  . ESOPHAGOGASTRODUODENOSCOPY (EGD) WITH PROPOFOL N/A 01/05/2018   Procedure: ESOPHAGOGASTRODUODENOSCOPY (EGD) WITH PROPOFOL;  Surgeon: Jonathon Bellows, MD;  Location: [facility] ENDOSCOPY;  Service: Gastroenterology;  Laterality: N/A;  . ESOPHAGOGASTRODUODENOSCOPY (EGD) WITH PROPOFOL N/A 06/13/2018   Procedure: ESOPHAGOGASTRODUODENOSCOPY (EGD) WITH PROPOFOL;  Surgeon: Jonathon Bellows, MD;  Location: [facility] ENDOSCOPY;  Service: Gastroenterology;  Laterality: N/A;  . ESOPHAGOGASTRODUODENOSCOPY (EGD) WITH PROPOFOL N/A 07/16/2018   Procedure: ESOPHAGOGASTRODUODENOSCOPY (EGD) WITH PROPOFOL;  Surgeon: Lucilla Lame, MD;  Location: [facility] ENDOSCOPY;  Service: Endoscopy;  Laterality: N/A;  . HARDWARE REMOVAL Left 12/27/2016   Procedure: HARDWARE REMOVAL LEFT  PROXIMAL HUMEROUS;  Surgeon: Leim Fabry, MD;  Location: [facility];  Service: Orthopedics;  Laterality: Left;  . IR GJ TUBE CHANGE  04/21/2018  . NISSEN FUNDOPLICATION N/A 0000000   Procedure: NISSEN FUNDOPLICATION;  Surgeon: Jules Husbands, MD;  Location: [facility];  Service: General;  Laterality: N/A;  . ORIF HUMERUS FRACTURE Left 11/23/2016   Procedure: OPEN REDUCTION INTERNAL FIXATION (ORIF) PROXIMAL HUMERUS FRACTURE;  Surgeon: Leim Fabry, MD;  Location: [facility];  Service: Orthopedics;  Laterality: Left;  . REPAIR OF ESOPHAGUS  03/09/2018   Procedure: REPAIR OF ESOPHAGUS;  Surgeon: Jules Husbands, MD;  Location: [facility];  Service: General;;  . REVERSE SHOULDER ARTHROPLASTY Left 12/27/2016   Procedure: REVERSE SHOULDER ARTHROPLASTY;  Surgeon: Leim Fabry, MD;  Location: [facility];  Service: Orthopedics;  Laterality: Left;  . ROBOTIC ASSISTED LAPAROSCOPIC REPAIR OF PARAESOPHAGEAL HERNIA N/A 03/09/2018   Procedure: ROBOTIC HIATAL HERNIA CONVERTED TO OPEN;  Surgeon: Jules Husbands, MD;  Location: [facility];  Service: General;  Laterality: N/A;    Medications:  Current Outpatient  Medications on File Prior to Visit  Medication Sig  . polyethylene glycol (MIRALAX / GLYCOLAX) 17 g packet Take 17 g by mouth daily.   No current facility-administered medications on file prior to visit.    Allergies:  No Known Allergies  Social History:  Social History   Socioeconomic History  . Marital status: Widowed    Spouse name: Not on file  . Number of children: 2  . Years of education: Not on file  . Highest education level: Bachelor's degree (e.g., BA, AB, BS)  Occupational History  . Not on file  Tobacco Use  . Smoking status: Former Smoker    Packs/day: 0.10    Years: 10.00    Pack years: 1.00    Types: Cigarettes    Start date: 11/25/2009    Quit date: 10/22/2019    Years since quitting: 0.2  . Smokeless tobacco: Never Used  . Tobacco comment: 1 cigarette daily  Vaping Use  . Vaping Use: Never used  Substance and Sexual Activity  . Alcohol use: No    Alcohol/week: 0.0 standard drinks  . Drug use: No  .  Sexual activity: Never  Other Topics Concern  . Not on file  Social History Narrative  . Not on file   Social Determinants of Health   Financial Resource Strain: Not on file  Food Insecurity: Not on file  Transportation Needs: Not on file  Physical Activity: Not on file  Stress: Not on file  Social Connections: Not on file  Intimate Partner Violence: Not on file   Social History   Tobacco Use  Smoking Status Former Smoker  . Packs/day: 0.10  . Years: 10.00  . Pack years: 1.00  . Types: Cigarettes  . Start date: 11/25/2009  . Quit date: 10/22/2019  . Years since quitting: 0.2  Smokeless Tobacco Never Used  Tobacco Comment   1 cigarette daily   Social History   Substance and Sexual Activity  Alcohol Use No  . Alcohol/week: 0.0 standard drinks    Family History:  Family History  Problem Relation Age of Onset  . Anxiety disorder Brother   . Obesity Brother   . COPD Brother   . Kidney disease Mother   . Heart attack Father   .  Hypertension Father   . Alcohol abuse Father   . Depression Brother   . Anxiety disorder Brother   . Breast cancer Maternal Grandmother     Past medical history, surgical history, medications, allergies, family history and social history reviewed with patient today and changes made to appropriate areas of the chart.   Review of Systems - negative All other ROS negative except what is listed above and in the HPI.      Objective:    BP 100/69   Pulse 94   Temp 98.2 F (36.8 C)   Ht 5' 5.16" (1.655 m)   Wt 168 lb 6 oz (76.4 kg)   LMP  (LMP Unknown)   SpO2 97%   BMI 27.88 kg/m   Wt Readings from Last 3 Encounters:  01/21/20 168 lb 6 oz (76.4 kg)  09/24/19 120 lb (54.4 kg)  07/12/19 126 lb (57.2 kg)    Physical Exam Constitutional:      General: She is awake. She is not in acute distress.    Appearance: She is well-developed. She is not ill-appearing.  HENT:     Head: Normocephalic and atraumatic.     Right Ear: Hearing, tympanic membrane, ear canal and external ear normal. No drainage.     Left Ear: Hearing, tympanic membrane, ear canal and external ear normal. No drainage.     Nose: Nose normal.     Right Sinus: No maxillary sinus tenderness or frontal sinus tenderness.     Left Sinus: No maxillary sinus tenderness or frontal sinus tenderness.     Mouth/Throat:     Mouth: Mucous membranes are moist.     Pharynx: Oropharynx is clear. Uvula midline. No pharyngeal swelling, oropharyngeal exudate or posterior oropharyngeal erythema.  Eyes:     General: Lids are normal.        Right eye: No discharge.        Left eye: No discharge.     Extraocular Movements: Extraocular movements intact.     Conjunctiva/sclera: Conjunctivae normal.     Pupils: Pupils are equal, round, and reactive to light.     Visual Fields: Right eye visual fields normal and left eye visual fields normal.  Neck:     Thyroid: No thyromegaly.     Vascular: No carotid bruit.     Trachea: Trachea normal.   Cardiovascular:  Rate and Rhythm: Normal rate and regular rhythm.     Heart sounds: Normal heart sounds. No murmur heard. No gallop.   Pulmonary:     Effort: Pulmonary effort is normal. No accessory muscle usage or respiratory distress.     Breath sounds: Normal breath sounds.  Chest:     Comments: Deferred per patient request. Abdominal:     General: Bowel sounds are normal.     Palpations: Abdomen is soft. There is no hepatomegaly or splenomegaly.     Tenderness: There is no abdominal tenderness.  Musculoskeletal:        General: Normal range of motion.     Cervical back: Normal range of motion and neck supple.     Right lower leg: No edema.     Left lower leg: No edema.  Lymphadenopathy:     Head:     Right side of head: No submental, submandibular, tonsillar, preauricular or posterior auricular adenopathy.     Left side of head: No submental, submandibular, tonsillar, preauricular or posterior auricular adenopathy.     Cervical: No cervical adenopathy.  Skin:    General: Skin is warm and dry.     Capillary Refill: Capillary refill takes less than 2 seconds.     Findings: No rash.  Neurological:     Mental Status: She is alert and oriented to person, place, and time.     Cranial Nerves: Cranial nerves are intact.     Gait: Gait is intact.     Deep Tendon Reflexes: Reflexes are normal and symmetric.     Reflex Scores:      Brachioradialis reflexes are 2+ on the right side and 2+ on the left side.      Patellar reflexes are 2+ on the right side and 2+ on the left side. Psychiatric:        Attention and Perception: Attention normal.        Mood and Affect: Mood normal.        Speech: Speech normal.        Behavior: Behavior normal. Behavior is cooperative.        Thought Content: Thought content normal.        Judgment: Judgment normal.    Results for orders placed or performed during the hospital encounter of 09/24/19  SARS Coronavirus 2 by RT PCR (hospital order,  performed in [facility] hospital lab) Nasopharyngeal Nasopharyngeal Swab   Specimen: Nasopharyngeal Swab  Result Value Ref Range   SARS Coronavirus 2 NEGATIVE NEGATIVE  Basic metabolic panel  Result Value Ref Range   Sodium 132 (L) 135 - 145 mmol/L   Potassium 4.2 3.5 - 5.1 mmol/L   Chloride 95 (L) 98 - 111 mmol/L   CO2 26 22 - 32 mmol/L   Glucose, Bld 97 70 - 99 mg/dL   BUN 13 8 - 23 mg/dL   Creatinine, Ser 2.67 0.44 - 1.00 mg/dL   Calcium 8.6 (L) 8.9 - 10.3 mg/dL   GFR calc non Af Amer >60 >60 mL/min   GFR calc Af Amer >60 >60 mL/min   Anion gap 11 5 - 15  CBC  Result Value Ref Range   WBC 7.7 4.0 - 10.5 K/uL   RBC 4.12 3.87 - 5.11 MIL/uL   Hemoglobin 13.0 12.0 - 15.0 g/dL   HCT 12.4 58.0 - 99.8 %   MCV 93.0 80.0 - 100.0 fL   MCH 31.6 26.0 - 34.0 pg   MCHC 33.9 30.0 - 36.0  g/dL   RDW 12.5 11.5 - 15.5 %   Platelets 205 150 - 400 K/uL   nRBC 0.0 0.0 - 0.2 %  Urinalysis, Complete w Microscopic  Result Value Ref Range   Color, Urine YELLOW (A) YELLOW   APPearance CLEAR (A) CLEAR   Specific Gravity, Urine 1.008 1.005 - 1.030   pH 6.0 5.0 - 8.0   Glucose, UA NEGATIVE NEGATIVE mg/dL   Hgb urine dipstick SMALL (A) NEGATIVE   Bilirubin Urine NEGATIVE NEGATIVE   Ketones, ur 5 (A) NEGATIVE mg/dL   Protein, ur NEGATIVE NEGATIVE mg/dL   Nitrite NEGATIVE NEGATIVE   Leukocytes,Ua NEGATIVE NEGATIVE   WBC, UA 0-5 0 - 5 WBC/hpf   Bacteria, UA NONE SEEN NONE SEEN   Squamous Epithelial / LPF NONE SEEN 0 - 5  Lipase, blood  Result Value Ref Range   Lipase 19 11 - 51 U/L  Lactic acid, plasma  Result Value Ref Range   Lactic Acid, Venous 1.2 0.5 - 1.9 mmol/L  Hepatic function panel  Result Value Ref Range   Total Protein 6.8 6.5 - 8.1 g/dL   Albumin 3.8 3.5 - 5.0 g/dL   AST 54 (H) 15 - 41 U/L   ALT 61 (H) 0 - 44 U/L   Alkaline Phosphatase 80 38 - 126 U/L   Total Bilirubin 1.9 (H) 0.3 - 1.2 mg/dL   Bilirubin, Direct 0.3 (H) 0.0 - 0.2 mg/dL   Indirect Bilirubin 1.6 (H) 0.3 - 0.9  mg/dL  Ethanol  Result Value Ref Range   Alcohol, Ethyl (B) <10 <10 mg/dL  Acetaminophen level  Result Value Ref Range   Acetaminophen (Tylenol), Serum <10 (L) 10 - 30 ug/mL  Salicylate level  Result Value Ref Range   Salicylate Lvl [id number] (L) 7.0 - 30.0 mg/dL  Urine Drug Screen, Qualitative  Result Value Ref Range   Tricyclic, Ur Screen NONE DETECTED NONE DETECTED   Amphetamines, Ur Screen NONE DETECTED NONE DETECTED   MDMA (Ecstasy)Ur Screen NONE DETECTED NONE DETECTED   Cocaine Metabolite,Ur [place] NONE DETECTED NONE DETECTED   Opiate, Ur Screen NONE DETECTED NONE DETECTED   Phencyclidine (PCP) Ur S NONE DETECTED NONE DETECTED   Cannabinoid 50 Ng, Ur [place] NONE DETECTED NONE DETECTED   Barbiturates, Ur Screen NONE DETECTED NONE DETECTED   Benzodiazepine, Ur Scrn NONE DETECTED NONE DETECTED   Methadone Scn, Ur NONE DETECTED NONE DETECTED      Assessment & Plan:   Problem List Items Addressed This Visit      Cardiovascular and Mediastinum   Aortic atherosclerosis ([place])    Noted on imaging 09/24/19.  Recommend continue smoking cessation and will consider statin addition if elevation in cholesterol levels.        Musculoskeletal and Integument   Osteopenia    Noted on DEXA 08/09/17.  Recommend continue ensuring adequate calcium intake daily and add on a Vitamin D3 supplement.  Check Vitamin D3 level today and plan on repeat DEXA in 2024.      Relevant Orders   VITAMIN D 25 Hydroxy (Vit-D Deficiency, Fractures)     Other   History of breast cancer    Mammogram ordered today.      Insomnia    Chronic, ongoing issue.  Will increase Trazodone to max dose 300 MG QHS.  Referral to local psychiatry group placed to decrease travel time for patient.  Would benefit from ongoing psychiatric care due to lengthy mental health history.      Nicotine dependence, cigarettes,  uncomplicated    Quit 2-3 months ago.  I have recommended complete cessation of tobacco use. I have discussed various  options available for assistance with tobacco cessation including over the counter methods (Nicotine gum, patch and lozenges). We also discussed prescription options (Chantix, Nicotine Inhaler / Nasal Spray). The patient is not interested in pursuing any prescription tobacco cessation options at this time.       Major depressive disorder, recurrent severe without psychotic features ([place]) - Primary    Chronic, ongoing.  Denies SI/HI.  Referral to local psychiatry group placed to decrease travel time.  Would benefit from ongoing psychiatric care due to lengthy history.  Continue current medication regimen and adjust as needed.  Return to office in 6 months to meet new PCP, sooner if worsening mood.      Relevant Medications   busPIRone (BUSPAR) 10 MG tablet   buPROPion (WELLBUTRIN XL) 300 MG 24 hr tablet   trazodone (DESYREL) 300 MG tablet   Other Relevant Orders   Ambulatory referral to Psychiatry    Other Visit Diagnoses    Encounter for screening mammogram for malignant neoplasm of breast       Mammogram ordered   Relevant Orders   MM DIGITAL SCREENING BILATERAL   Flu vaccine need       Flu vaccine today   Relevant Orders   Flu Vaccine QUAD High Dose(Fluad) (Completed)   Thyroid disorder screen       TSH on labs today   Relevant Orders   TSH   Screening cholesterol level       Lipid panel on labs today   Relevant Orders   Lipid Panel w/o Chol/HDL Ratio   Encounter for annual physical exam       Annual labs today to include CBC, CMP, TSH, lipid   Relevant Orders   CBC with Differential/Platelet   Comprehensive metabolic panel       Follow up plan: Return in about 6 months (around 07/21/2020) for DEPRESSION -- meet new PCP.   LABORATORY TESTING:  - Pap smear: not applicable  IMMUNIZATIONS:   - Tdap: Tetanus vaccination status reviewed: last tetanus booster within 10 years. - Influenza: Administered today - Pneumovax: Up to date - Prevnar: Up to date - HPV: Not  applicable - Zostavax vaccine: Up to date  SCREENING: -Mammogram: Ordered today  - Colonoscopy: Up to date  - Bone Density: Up to date  -Hearing Test: Not applicable  -Spirometry: Not applicable   PATIENT COUNSELING:   Advised to take 1 mg of folate supplement per day if capable of pregnancy.   Sexuality: Discussed sexually transmitted diseases, partner selection, use of condoms, avoidance of unintended pregnancy  and contraceptive alternatives.   Advised to avoid cigarette smoking.  I discussed with the patient that most people either abstain from alcohol or drink within safe limits (<=14/week and <=4 drinks/occasion for males, <=7/weeks and <= 3 drinks/occasion for females) and that the risk for alcohol disorders and other health effects rises proportionally with the number of drinks per week and how often a drinker exceeds daily limits.  Discussed cessation/primary prevention of drug use and availability of treatment for abuse.   Diet: Encouraged to adjust caloric intake to maintain  or achieve ideal body weight, to reduce intake of dietary saturated fat and total fat, to limit sodium intake by avoiding high sodium foods and not adding table salt, and to maintain adequate dietary potassium and calcium preferably from fresh fruits, vegetables, and low-fat dairy  products.    Stressed the importance of regular exercise  Injury prevention: Discussed safety belts, safety helmets, smoke detector, smoking near bedding or upholstery.   Dental health: Discussed importance of regular tooth brushing, flossing, and dental visits.    NEXT PREVENTATIVE PHYSICAL DUE IN 1 YEAR. Return in about 6 months (around 07/21/2020) for DEPRESSION -- meet new PCP.

## 2020-01-21 NOTE — Assessment & Plan Note (Signed)
Chronic, ongoing.  Denies SI/HI.  Referral to local psychiatry group placed to decrease travel time.  Would benefit from ongoing psychiatric care due to lengthy history.  Continue current medication regimen and adjust as needed.  Return to office in 6 months to meet new PCP, sooner if worsening mood.

## 2020-01-22 LAB — CBC WITH DIFFERENTIAL/PLATELET
Basophils Absolute: 0.1 10*3/uL (ref 0.0–0.2)
Basos: 1 %
EOS (ABSOLUTE): 0.1 10*3/uL (ref 0.0–0.4)
Eos: 3 %
Hematocrit: 42 % (ref 34.0–46.6)
Hemoglobin: 13.9 g/dL (ref 11.1–15.9)
Immature Grans (Abs): 0 10*3/uL (ref 0.0–0.1)
Immature Granulocytes: 0 %
Lymphocytes Absolute: 1 10*3/uL (ref 0.7–3.1)
Lymphs: 22 %
MCH: 30.3 pg (ref 26.6–33.0)
MCHC: 33.1 g/dL (ref 31.5–35.7)
MCV: 92 fL (ref 79–97)
Monocytes Absolute: 0.4 10*3/uL (ref 0.1–0.9)
Monocytes: 9 %
Neutrophils Absolute: 3 10*3/uL (ref 1.4–7.0)
Neutrophils: 65 %
Platelets: 231 10*3/uL (ref 150–450)
RBC: 4.59 x10E6/uL (ref 3.77–5.28)
RDW: 13 % (ref 11.7–15.4)
WBC: 4.6 10*3/uL (ref 3.4–10.8)

## 2020-01-22 LAB — COMPREHENSIVE METABOLIC PANEL
ALT: 13 IU/L (ref 0–32)
AST: 15 IU/L (ref 0–40)
Albumin/Globulin Ratio: 1.5 (ref 1.2–2.2)
Albumin: 3.8 g/dL (ref 3.8–4.8)
Alkaline Phosphatase: 113 IU/L (ref 44–121)
BUN/Creatinine Ratio: 14 (ref 12–28)
BUN: 11 mg/dL (ref 8–27)
Bilirubin Total: 0.4 mg/dL (ref 0.0–1.2)
CO2: 22 mmol/L (ref 20–29)
Calcium: 9 mg/dL (ref 8.7–10.3)
Chloride: 102 mmol/L (ref 96–106)
Creatinine, Ser: 0.76 mg/dL (ref 0.57–1.00)
GFR calc Af Amer: 93 mL/min/{1.73_m2} (ref >59)
GFR calc non Af Amer: 81 mL/min/{1.73_m2} (ref >59)
Globulin, Total: 2.5 g/dL (ref 1.5–4.5)
Glucose: 106 mg/dL — ABNORMAL HIGH (ref 65–99)
Potassium: 4.6 mmol/L (ref 3.5–5.2)
Sodium: 140 mmol/L (ref 134–144)
Total Protein: 6.3 g/dL (ref 6.0–8.5)

## 2020-01-22 LAB — LIPID PANEL W/O CHOL/HDL RATIO
Cholesterol, Total: 210 mg/dL — ABNORMAL HIGH (ref 100–199)
HDL: 62 mg/dL (ref >39)
LDL Chol Calc (NIH): 131 mg/dL — ABNORMAL HIGH (ref 0–99)
Triglycerides: 98 mg/dL (ref 0–149)
VLDL Cholesterol Cal: 17 mg/dL (ref 5–40)

## 2020-01-22 LAB — TSH: TSH: 2.88 u[IU]/mL (ref 0.450–4.500)

## 2020-01-22 LAB — VITAMIN D 25 HYDROXY (VIT D DEFICIENCY, FRACTURES): Vit D, 25-Hydroxy: 19.6 ng/mL — ABNORMAL LOW (ref 30.0–100.0)

## 2020-01-22 NOTE — Progress Notes (Signed)
Please let Ms. Cuccia know her labs have returned.  No anemia seen.  Kidney and liver function remain stable. Thyroid normal.  Cholesterol levels remain elevated, but at this time continue recommendation to focus on diet and regular activity.  Vitamin D level is low at 19.6, would like to see 30 or greater.  I recommend starting over the counter Vitamin D3 2000 units daily which is good for overall bone and muscle health.  Will recheck next visit.  Any questions?  Have a great day!! Keep being awesome!!  Thank you for allowing me to participate in your care. Kindest regards, Klye Besecker

## 2020-06-19 ENCOUNTER — Telehealth: Payer: Self-pay

## 2020-06-19 NOTE — Telephone Encounter (Signed)
Copied from [place] [id number]. Topic: General - Other >> Jun 19, 2020  9:36 AM Tessa Lerner A wrote: Reason for CRM: Patient accidentally spilled their remaining trazodone (DESYREL) 300 MG tablets on Saturday 06/14/20 and has none left   Patient contacted their pharmacy and was directed to contact their PCP for a refill because it was too early to be filled  Patient would like their prescription submitted to their preferred pharmacy if possible [facility] [place], [place] AT [address]  Phone:  [phone] Fax:  [phone]  Please contact to further advise prescription submission

## 2020-06-20 NOTE — Telephone Encounter (Signed)
Have tried multiple times to make contact with patient, goes straight to voicemail, lvm to return call. Patient has not been seen by Dr. Neomia Dear, per Dr. Neomia Dear patient can get a prescription of Trazodone only to get her to her appt. Patient needs appointment asap

## 2020-06-24 ENCOUNTER — Other Ambulatory Visit: Payer: Self-pay

## 2020-06-24 ENCOUNTER — Ambulatory Visit (INDEPENDENT_AMBULATORY_CARE_PROVIDER_SITE_OTHER): Payer: Medicare Other | Admitting: Nurse Practitioner

## 2020-06-24 ENCOUNTER — Encounter: Payer: Self-pay | Admitting: Nurse Practitioner

## 2020-06-24 VITALS — BP 82/58 | HR 102 | Temp 98.4°F | Ht 65.2 in | Wt 177.0 lb

## 2020-06-24 DIAGNOSIS — M25562 Pain in left knee: Secondary | ICD-10-CM

## 2020-06-24 DIAGNOSIS — M25561 Pain in right knee: Secondary | ICD-10-CM | POA: Diagnosis not present

## 2020-06-24 DIAGNOSIS — I700 Atherosclerosis of aorta: Secondary | ICD-10-CM | POA: Diagnosis not present

## 2020-06-24 DIAGNOSIS — K219 Gastro-esophageal reflux disease without esophagitis: Secondary | ICD-10-CM | POA: Diagnosis not present

## 2020-06-24 DIAGNOSIS — F332 Major depressive disorder, recurrent severe without psychotic features: Secondary | ICD-10-CM | POA: Diagnosis not present

## 2020-06-24 DIAGNOSIS — G8929 Other chronic pain: Secondary | ICD-10-CM | POA: Diagnosis not present

## 2020-06-24 DIAGNOSIS — Z8639 Personal history of other endocrine, nutritional and metabolic disease: Secondary | ICD-10-CM | POA: Diagnosis not present

## 2020-06-24 MED ORDER — TRAZODONE HCL 300 MG PO TABS: 300.0000 mg | ORAL_TABLET | Freq: Every day | ORAL | 1 refills | Status: DC

## 2020-06-24 MED ORDER — BUPROPION HCL ER (XL) 300 MG PO TB24: 300.0000 mg | ORAL_TABLET | Freq: Every day | ORAL | 1 refills | Status: DC

## 2020-06-24 MED ORDER — BUSPIRONE HCL 10 MG PO TABS: 10.0000 mg | ORAL_TABLET | Freq: Three times a day (TID) | ORAL | 1 refills | Status: DC

## 2020-06-24 MED ORDER — PANTOPRAZOLE SODIUM 40 MG PO TBEC: 40.0000 mg | DELAYED_RELEASE_TABLET | Freq: Every day | ORAL | 1 refills | Status: DC

## 2020-06-24 MED ORDER — OLANZAPINE 5 MG PO TABS: 5.0000 mg | ORAL_TABLET | Freq: Every day | ORAL | 1 refills | Status: DC

## 2020-06-24 MED ORDER — DOCUSATE SODIUM 100 MG PO CAPS: 100.0000 mg | ORAL_CAPSULE | Freq: Two times a day (BID) | ORAL | 1 refills | Status: DC

## 2020-06-24 NOTE — Assessment & Plan Note (Signed)
Chronic, noted on CT scan 09/24/19. Will check CMP, CBC, and lipid panel today. Encouraged smoking cessation. Will treat based on results.

## 2020-06-24 NOTE — Progress Notes (Signed)
Established Patient Office Visit  Subjective:  Patient ID: Chelsey [name], female    DOB: Dec 29, 1950  Age: 70 y.o. MRN: [id number]  CC:  Chief Complaint  Patient presents with  . Medication Refill  . Hyperlipidemia  . Depression    HPI Chelsey [name] presents for follow-up on depression and hyperlipidemia  HYPERLIPIDEMIA  Hyperlipidemia status: controlled Satisfied with current treatment?  yes Side effects:  n/a Medication compliance: n/a Past cholesterol meds: none Supplements: none Aspirin:  no The ASCVD Risk score Mikey Bussing DC Jr., et al., 2013) failed to calculate for the following reasons:   The valid systolic blood pressure range is 90 to 200 mmHg Chest pain:  no Coronary artery disease:  no Family history CAD:  yes Family history early CAD:  no   DEPRESSION  Mood status: controlled Satisfied with current treatment?: yes Symptom severity: mild  Duration of current treatment : chronic Side effects: no Medication compliance: excellent compliance Psychotherapy/counseling: no  Previous psychiatric medications: buspar, wellbutrin and zyprexa , trazadone Depressed mood: no Anxious mood: no Anhedonia: no Significant weight loss or gain: no Insomnia: controlled with trazadone Fatigue: no Feelings of worthlessness or guilt: no Impaired concentration/indecisiveness: no Suicidal ideations: no Hopelessness: no Crying spells: no Depression screen [facility] 2/9 06/24/2020 01/21/2020 07/12/2019 07/27/2017 08/17/2016  Decreased Interest 0 0 1 1 0  Down, Depressed, Hopeless 0 0 0 1 1  PHQ - 2 Score 0 0 1 2 1   Altered sleeping 1 3 0 0 1  Tired, decreased energy 1 1 1 1 1   Change in appetite 0 - 1 1 0  Feeling bad or failure about yourself  0 0 0 1 0  Trouble concentrating 0 0 0 0 1  Moving slowly or fidgety/restless 0 0 0 0 0  Suicidal thoughts 0 0 0 1 0  PHQ-9 Score 2 4 3 6 4   Difficult doing work/chores Not difficult at all Not difficult at all - - -  Some recent data  might be hidden   KNEE PAIN   Duration: chronic Involved knee: bilateral Mechanism of injury: unknown Location:diffuse Onset: gradual Severity: moderate  Quality:  aching Frequency: intermittent Radiation: no Aggravating factors: from sitting to standing  Alleviating factors: rest  Status: fluctuating Treatments attempted: none  Relief with NSAIDs?:  No NSAIDs Taken Weakness with weight bearing or walking: no Sensation of giving way: no Locking: no Popping: no Bruising: no Swelling: no Redness: no Paresthesias/decreased sensation: no Fevers: no   Past Medical History:  Diagnosis Date  . Breast cancer ([place]) 1997   right breast, radiation  . Bronchitis    recent/ 06/09/15 had chest xray/ [facility]/resolved  . Cancer [facility]) 1997   right lumpectomy,L/Ad/R   . GERD (gastroesophageal reflux disease)   . History of hiatal hernia   . Hypercholesteremia   . Hyperlipidemia   . Low BP    TYPICALLY RUNS 80'S/60'S  . Panic attack   . Personal history of malignant neoplasm of breast     Past Surgical History:  Procedure Laterality Date  . BICEPT TENODESIS Left 11/23/2016   Procedure: BICEPS TENODESIS;  Surgeon: Leim Fabry, MD;  Location: [facility];  Service: Orthopedics;  Laterality: Left;  . BREAST EXCISIONAL BIOPSY Right 1997   pos  . BREAST SURGERY Right 1997   lumpectomy  . CHOLECYSTECTOMY N/A 08/06/2016   Procedure: LAPAROSCOPIC CHOLECYSTECTOMY WITH INTRAOPERATIVE CHOLANGIOGRAM;  Surgeon: Christene Lye, MD;  Location: [facility];  Service: General;  Laterality: N/A;  .  COLONOSCOPY  2008  . COLONOSCOPY WITH PROPOFOL N/A 07/21/2015   Procedure: COLONOSCOPY WITH PROPOFOL;  Surgeon: Lucilla Lame, MD;  Location: [place];  Service: Endoscopy;  Laterality: N/A;  . COLONOSCOPY WITH PROPOFOL N/A 01/05/2018   Procedure: COLONOSCOPY WITH PROPOFOL;  Surgeon: Jonathon Bellows, MD;  Location: [facility]COPY;  Service: Gastroenterology;  Laterality: N/A;  .  ESOPHAGOGASTRODUODENOSCOPY (EGD) WITH PROPOFOL N/A 06/16/2016   Procedure: ESOPHAGOGASTRODUODENOSCOPY (EGD) WITH PROPOFOL;  Surgeon: Christene Lye, MD;  Location: [facility] ENDOSCOPY;  Service: Endoscopy;  Laterality: N/A;  . ESOPHAGOGASTRODUODENOSCOPY (EGD) WITH PROPOFOL N/A 01/05/2018   Procedure: ESOPHAGOGASTRODUODENOSCOPY (EGD) WITH PROPOFOL;  Surgeon: Jonathon Bellows, MD;  Location: [facility] ENDOSCOPY;  Service: Gastroenterology;  Laterality: N/A;  . ESOPHAGOGASTRODUODENOSCOPY (EGD) WITH PROPOFOL N/A 06/13/2018   Procedure: ESOPHAGOGASTRODUODENOSCOPY (EGD) WITH PROPOFOL;  Surgeon: Jonathon Bellows, MD;  Location: [facility] ENDOSCOPY;  Service: Gastroenterology;  Laterality: N/A;  . ESOPHAGOGASTRODUODENOSCOPY (EGD) WITH PROPOFOL N/A 07/16/2018   Procedure: ESOPHAGOGASTRODUODENOSCOPY (EGD) WITH PROPOFOL;  Surgeon: Lucilla Lame, MD;  Location: [facility] ENDOSCOPY;  Service: Endoscopy;  Laterality: N/A;  . HARDWARE REMOVAL Left 12/27/2016   Procedure: HARDWARE REMOVAL LEFT  PROXIMAL HUMEROUS;  Surgeon: Leim Fabry, MD;  Location: [facility];  Service: Orthopedics;  Laterality: Left;  . IR GJ TUBE CHANGE  04/21/2018  . NISSEN FUNDOPLICATION N/A 1/61/0960   Procedure: NISSEN FUNDOPLICATION;  Surgeon: Jules Husbands, MD;  Location: [facility];  Service: General;  Laterality: N/A;  . ORIF HUMERUS FRACTURE Left 11/23/2016   Procedure: OPEN REDUCTION INTERNAL FIXATION (ORIF) PROXIMAL HUMERUS FRACTURE;  Surgeon: Leim Fabry, MD;  Location: [facility];  Service: Orthopedics;  Laterality: Left;  . REPAIR OF ESOPHAGUS  03/09/2018   Procedure: REPAIR OF ESOPHAGUS;  Surgeon: Jules Husbands, MD;  Location: [facility];  Service: General;;  . REVERSE SHOULDER ARTHROPLASTY Left 12/27/2016   Procedure: REVERSE SHOULDER ARTHROPLASTY;  Surgeon: Leim Fabry, MD;  Location: [facility];  Service: Orthopedics;  Laterality: Left;  . ROBOTIC ASSISTED LAPAROSCOPIC REPAIR OF PARAESOPHAGEAL HERNIA N/A 03/09/2018   Procedure: ROBOTIC HIATAL HERNIA CONVERTED  TO OPEN;  Surgeon: Jules Husbands, MD;  Location: [facility];  Service: General;  Laterality: N/A;    Family History  Problem Relation Age of Onset  . Anxiety disorder Brother   . Obesity Brother   . COPD Brother   . Kidney disease Mother   . Heart attack Father   . Hypertension Father   . Alcohol abuse Father   . Depression Brother   . Anxiety disorder Brother   . Breast cancer Maternal Grandmother     Social History   Socioeconomic History  . Marital status: Widowed    Spouse name: Not on file  . Number of children: 2  . Years of education: Not on file  . Highest education level: Bachelor's degree (e.g., BA, AB, BS)  Occupational History  . Not on file  Tobacco Use  . Smoking status: Former Smoker    Packs/day: 0.10    Years: 10.00    Pack years: 1.00    Types: Cigarettes    Start date: 11/25/2009    Quit date: 10/22/2019    Years since quitting: 0.6  . Smokeless tobacco: Never Used  . Tobacco comment: 1 cigarette daily  Vaping Use  . Vaping Use: Never used  Substance and Sexual Activity  . Alcohol use: No    Alcohol/week: 0.0 standard drinks  . Drug use: No  . Sexual activity: Never  Other Topics Concern  . Not on  file  Social History Narrative  . Not on file   Social Determinants of Health   Financial Resource Strain: Not on file  Food Insecurity: Not on file  Transportation Needs: Not on file  Physical Activity: Not on file  Stress: Not on file  Social Connections: Not on file  Intimate Partner Violence: Not on file    Outpatient Medications Prior to Visit  Medication Sig Dispense Refill  . polyethylene glycol (MIRALAX / GLYCOLAX) 17 g packet Take 17 g by mouth daily. 30 each 1  . buPROPion (WELLBUTRIN XL) 300 MG 24 hr tablet Take 1 tablet (300 mg total) by mouth daily. 30 tablet 4  . busPIRone (BUSPAR) 10 MG tablet Take 1 tablet (10 mg total) by mouth 3 (three) times daily. 90 tablet 4  . docusate sodium (COLACE) 100 MG capsule Take 1 capsule (100  mg total) by mouth 2 (two) times daily. 90 capsule 4  . OLANZapine (ZYPREXA) 5 MG tablet Take 1 tablet (5 mg total) by mouth at bedtime. 30 tablet 4  . pantoprazole (PROTONIX) 40 MG tablet Take 1 tablet (40 mg total) by mouth daily. 30 tablet 4  . trazodone (DESYREL) 300 MG tablet Take 1 tablet (300 mg total) by mouth at bedtime. 90 tablet 4   No facility-administered medications prior to visit.    No Known Allergies  ROS Review of Systems  Constitutional: Positive for fatigue. Negative for appetite change.  HENT: Negative.   Eyes: Negative.   Respiratory: Negative.   Cardiovascular: Negative.   Gastrointestinal: Negative.   Genitourinary: Negative.   Musculoskeletal: Positive for arthralgias (bilateral knee pain).  Skin: Negative.   Neurological: Negative.   Psychiatric/Behavioral:       Depression controlled with medication      Objective:    Physical Exam Vitals and nursing note reviewed.  Constitutional:      General: She is not in acute distress.    Appearance: Normal appearance.  HENT:     Head: Normocephalic and atraumatic.  Eyes:     Conjunctiva/sclera: Conjunctivae normal.  Neck:     Vascular: No carotid bruit.  Cardiovascular:     Rate and Rhythm: Normal rate and regular rhythm.     Pulses: Normal pulses.     Heart sounds: Normal heart sounds.  Pulmonary:     Effort: Pulmonary effort is normal.     Breath sounds: Normal breath sounds.  Musculoskeletal:        General: No swelling or tenderness. Normal range of motion.     Cervical back: Normal range of motion.  Skin:    General: Skin is warm and dry.  Neurological:     General: No focal deficit present.     Mental Status: She is alert and oriented to person, place, and time.  Psychiatric:        Mood and Affect: Mood normal.        Behavior: Behavior normal.        Thought Content: Thought content normal.        Judgment: Judgment normal.     BP (!) 82/58   Pulse (!) 102   Temp 98.4 F (36.9  C) (Oral)   Ht 5' 5.2" (1.656 m)   Wt 177 lb (80.3 kg)   LMP  (LMP Unknown)   SpO2 96%   BMI 29.28 kg/m  Wt Readings from Last 3 Encounters:  06/24/20 177 lb (80.3 kg)  01/21/20 168 lb 6 oz (76.4 kg)  09/24/19 120  lb (54.4 kg)     Health Maintenance Due  Topic Date Due  . MAMMOGRAM  03/05/2018    There are no preventive care reminders to display for this patient.  Lab Results  Component Value Date   TSH 2.880 01/21/2020   Lab Results  Component Value Date   WBC 4.6 01/21/2020   HGB 13.9 01/21/2020   HCT 42.0 01/21/2020   MCV 92 01/21/2020   PLT 231 01/21/2020   Lab Results  Component Value Date   NA 140 01/21/2020   K 4.6 01/21/2020   CO2 22 01/21/2020   GLUCOSE 106 (H) 01/21/2020   BUN 11 01/21/2020   CREATININE 0.76 01/21/2020   BILITOT 0.4 01/21/2020   ALKPHOS 113 01/21/2020   AST 15 01/21/2020   ALT 13 01/21/2020   PROT 6.3 01/21/2020   ALBUMIN 3.8 01/21/2020   CALCIUM 9.0 01/21/2020   ANIONGAP 11 09/24/2019   Lab Results  Component Value Date   CHOL 210 (H) 01/21/2020   Lab Results  Component Value Date   HDL 62 01/21/2020   Lab Results  Component Value Date   LDLCALC 131 (H) 01/21/2020   Lab Results  Component Value Date   TRIG 98 01/21/2020   Lab Results  Component Value Date   CHOLHDL 3.2 10/02/2019   No results found for: HGBA1C    Assessment & Plan:   Problem List Items Addressed This Visit      Cardiovascular and Mediastinum   Aortic atherosclerosis ([place]) - Primary    Chronic, noted on CT scan 09/24/19. Will check CMP, CBC, and lipid panel today. Encouraged smoking cessation. Will treat based on results.       Relevant Orders   Comp Met (CMET)   CBC with Differential   Lipid Panel w/o Chol/HDL Ratio     Digestive   Gastroesophageal reflux disease without esophagitis    Chronic, controlled well with protonix. Refill sent to the pharmacy. Follow-up in 6 months.      Relevant Medications   docusate sodium (COLACE)  100 MG capsule   pantoprazole (PROTONIX) 40 MG tablet     Other   H/O hypercholesterolemia    Checking lipid panel today. Treat and follow-up based on results.       Relevant Orders   Lipid Panel w/o Chol/HDL Ratio   Major depressive disorder, recurrent severe without psychotic features (HCC)    Chronic, stable. PHQ-9 score is a 2 today. Continue bupropion, bupsar, zyprexa, and trazadone. Refills sent to the pharmacy. Follow-up in 6 months.      Relevant Medications   buPROPion (WELLBUTRIN XL) 300 MG 24 hr tablet   busPIRone (BUSPAR) 10 MG tablet   trazodone (DESYREL) 300 MG tablet   Chronic pain of both knees    Has noticed intermittent pain in bilateral knees. Left knee x-ray reviewed which showed degenerative changes. She can take tylenol and use voltaren OTC gel as needed for pain. She can also use ice/heat. Discussed injections in the future if pain is still not controlled. Follow-up in 6 months or sooner with any concerns.       Relevant Medications   buPROPion (WELLBUTRIN XL) 300 MG 24 hr tablet   trazodone (DESYREL) 300 MG tablet      Meds ordered this encounter  Medications  . buPROPion (WELLBUTRIN XL) 300 MG 24 hr tablet    Sig: Take 1 tablet (300 mg total) by mouth daily.    Dispense:  90 tablet    Refill:  1  . busPIRone (BUSPAR) 10 MG tablet    Sig: Take 1 tablet (10 mg total) by mouth 3 (three) times daily.    Dispense:  270 tablet    Refill:  1  . docusate sodium (COLACE) 100 MG capsule    Sig: Take 1 capsule (100 mg total) by mouth 2 (two) times daily.    Dispense:  90 capsule    Refill:  1  . OLANZapine (ZYPREXA) 5 MG tablet    Sig: Take 1 tablet (5 mg total) by mouth at bedtime.    Dispense:  90 tablet    Refill:  1  . pantoprazole (PROTONIX) 40 MG tablet    Sig: Take 1 tablet (40 mg total) by mouth daily.    Dispense:  90 tablet    Refill:  1  . trazodone (DESYREL) 300 MG tablet    Sig: Take 1 tablet (300 mg total) by mouth at bedtime.     Dispense:  90 tablet    Refill:  1    Follow-up: Return in about 7 months (around 01/24/2021) for after 01/20/21 for physical.    Charyl Dancer, NP

## 2020-06-24 NOTE — Assessment & Plan Note (Signed)
Has noticed intermittent pain in bilateral knees. Left knee x-ray reviewed which showed degenerative changes. She can take tylenol and use voltaren OTC gel as needed for pain. She can also use ice/heat. Discussed injections in the future if pain is still not controlled. Follow-up in 6 months or sooner with any concerns.

## 2020-06-24 NOTE — Assessment & Plan Note (Signed)
Chronic, stable. PHQ-9 score is a 2 today. Continue bupropion, bupsar, zyprexa, and trazadone. Refills sent to the pharmacy. Follow-up in 6 months.

## 2020-06-24 NOTE — Assessment & Plan Note (Signed)
Checking lipid panel today. Treat and follow-up based on results.

## 2020-06-24 NOTE — Assessment & Plan Note (Signed)
Chronic, controlled well with protonix. Refill sent to the pharmacy. Follow-up in 6 months.

## 2020-06-24 NOTE — Patient Instructions (Signed)
It was great to see you!  You can use voltaren gel over the counter as needed for your knee pain. You can also take tylenol as needed for pain.   Let's follow-up in 6 month, sooner if you have concerns.  If a referral was placed today, you will be contacted for an appointment. Please note that routine referrals can sometimes take up to 3-4 weeks to process. Please call our office if you haven't heard anything after this time frame.  Take care,  Vance Peper, NP   Osteoarthritis  Osteoarthritis is a type of arthritis. It refers to joint pain or joint disease. Osteoarthritis affects tissue that covers the ends of bones in joints (cartilage). Cartilage acts as a cushion between the bones and helps them move smoothly. Osteoarthritis occurs when cartilage in the joints gets worn down. Osteoarthritis is sometimes called "wear and tear" arthritis. Osteoarthritis is the most common form of arthritis. It often occurs in older people. It is a condition that gets worse over time. The joints most often affected by this condition are in the fingers, toes, hips, knees, and spine, including the neck and lower back. What are the causes? This condition is caused by the wearing down of cartilage that covers the ends of bones. What increases the risk? The following factors may make you more likely to develop this condition:  Being age 54 or older.  Obesity.  Overuse of joints.  Past injury of a joint.  Past surgery on a joint.  Family history of osteoarthritis. What are the signs or symptoms? The main symptoms of this condition are pain, swelling, and stiffness in the joint. Other symptoms may include:  An enlarged joint.  More pain and further damage caused by small pieces of bone or cartilage that break off and float inside of the joint.  Small deposits of bone (osteophytes) that grow on the edges of the joint.  A grating or scraping feeling inside the joint when you move it.  Popping or  creaking sounds when you move.  Difficulty walking or exercising.  An inability to grip items, twist your hand(s), or control the movements of your hands and fingers. How is this diagnosed? This condition may be diagnosed based on:  Your medical history.  A physical exam.  Your symptoms.  X-rays of the affected joint(s).  Blood tests to rule out other types of arthritis. How is this treated? There is no cure for this condition, but treatment can help control pain and improve joint function. Treatment may include a combination of therapies, such as:  Pain relief techniques, such as: ? Applying heat and cold to the joint. ? Massage. ? A form of talk therapy called cognitive behavioral therapy (CBT). This therapy helps you set goals and follow up on the changes that you make.  Medicines for pain and inflammation. The medicines can be taken by mouth or applied to the skin. They include: ? NSAIDs, such as ibuprofen. ? Prescription medicines. ? Strong anti-inflammatory medicines (corticosteroids). ? Certain nutritional supplements.  A prescribed exercise program. You may work with a physical therapist.  Assistive devices, such as a brace, wrap, splint, specialized glove, or cane.  A weight control plan.  Surgery, such as: ? An osteotomy. This is done to reposition the bones and relieve pain or to remove loose pieces of bone and cartilage. ? Joint replacement surgery. You may need this surgery if you have advanced osteoarthritis. Follow these instructions at home: Activity  Rest your affected joints  as told by your health care provider.  Exercise as told by your health care provider. He or she may recommend specific types of exercise, such as: ? Strengthening exercises. These are done to strengthen the muscles that support joints affected by arthritis. ? Aerobic activities. These are exercises, such as brisk walking or water aerobics, that increase your heart  rate. ? Range-of-motion activities. These help your joints move more easily. ? Balance and agility exercises. Managing pain, stiffness, and swelling  If directed, apply heat to the affected area as often as told by your health care provider. Use the heat source that your health care provider recommends, such as a moist heat pack or a heating pad. ? If you have a removable assistive device, remove it as told by your health care provider. ? Place a towel between your skin and the heat source. If your health care provider tells you to keep the assistive device on while you apply heat, place a towel between the assistive device and the heat source. ? Leave the heat on for 20-30 minutes. ? Remove the heat if your skin turns bright red. This is especially important if you are unable to feel pain, heat, or cold. You may have a greater risk of getting burned.  If directed, put ice on the affected area. To do this: ? If you have a removable assistive device, remove it as told by your health care provider. ? Put ice in a plastic bag. ? Place a towel between your skin and the bag. If your health care provider tells you to keep the assistive device on during icing, place a towel between the assistive device and the bag. ? Leave the ice on for 20 minutes, 2-3 times a day. ? Move your fingers or toes often to reduce stiffness and swelling. ? Raise (elevate) the injured area above the level of your heart while you are sitting or lying down.      General instructions  Take over-the-counter and prescription medicines only as told by your health care provider.  Maintain a healthy weight. Follow instructions from your health care provider for weight control.  Do not use any products that contain nicotine or tobacco, such as cigarettes, e-cigarettes, and chewing tobacco. If you need help quitting, ask your health care provider.  Use assistive devices as told by your health care provider.  Keep all  follow-up visits as told by your health care provider. This is important. Where to find more information  [employer] of Arthritis and Musculoskeletal and Skin Diseases: www.niams.SouthExposed.es  [employer] on Aging: http://kim-miller.com/  American College of Rheumatology: www.rheumatology.org Contact a health care provider if:  You have redness, swelling, or a feeling of warmth in a joint that gets worse.  You have a fever along with joint or muscle aches.  You develop a rash.  You have trouble doing your normal activities. Get help right away if:  You have pain that gets worse and is not relieved by pain medicine. Summary  Osteoarthritis is a type of arthritis that affects tissue covering the ends of bones in joints (cartilage).  This condition is caused by the wearing down of cartilage that covers the ends of bones.  The main symptom of this condition is pain, swelling, and stiffness in the joint.  There is no cure for this condition, but treatment can help control pain and improve joint function. This information is not intended to replace advice given to you by your health care provider.  Make sure you discuss any questions you have with your health care provider. Document Revised: 01/08/2019 Document Reviewed: 01/08/2019 Elsevier Patient Education  2021 [employer].

## 2020-06-25 LAB — LIPID PANEL W/O CHOL/HDL RATIO
Cholesterol, Total: 245 mg/dL — ABNORMAL HIGH (ref 100–199)
HDL: 52 mg/dL (ref >39)
LDL Chol Calc (NIH): 165 mg/dL — ABNORMAL HIGH (ref 0–99)
Triglycerides: 153 mg/dL — ABNORMAL HIGH (ref 0–149)
VLDL Cholesterol Cal: 28 mg/dL (ref 5–40)

## 2020-06-25 LAB — COMPREHENSIVE METABOLIC PANEL
ALT: 12 IU/L (ref 0–32)
AST: 10 IU/L (ref 0–40)
Albumin/Globulin Ratio: 1.7 (ref 1.2–2.2)
Albumin: 3.8 g/dL (ref 3.8–4.8)
Alkaline Phosphatase: 117 IU/L (ref 44–121)
BUN/Creatinine Ratio: 17 (ref 12–28)
BUN: 13 mg/dL (ref 8–27)
Bilirubin Total: 0.3 mg/dL (ref 0.0–1.2)
CO2: 21 mmol/L (ref 20–29)
Calcium: 8.8 mg/dL (ref 8.7–10.3)
Chloride: 104 mmol/L (ref 96–106)
Creatinine, Ser: 0.76 mg/dL (ref 0.57–1.00)
Globulin, Total: 2.3 g/dL (ref 1.5–4.5)
Glucose: 94 mg/dL (ref 65–99)
Potassium: 4.4 mmol/L (ref 3.5–5.2)
Sodium: 139 mmol/L (ref 134–144)
Total Protein: 6.1 g/dL (ref 6.0–8.5)
eGFR: 85 mL/min/{1.73_m2} (ref >59)

## 2020-06-25 LAB — CBC WITH DIFFERENTIAL/PLATELET
Basophils Absolute: 0.1 10*3/uL (ref 0.0–0.2)
Basos: 1 %
EOS (ABSOLUTE): 0.1 10*3/uL (ref 0.0–0.4)
Eos: 2 %
Hematocrit: 41.8 % (ref 34.0–46.6)
Hemoglobin: 13.8 g/dL (ref 11.1–15.9)
Immature Grans (Abs): 0 10*3/uL (ref 0.0–0.1)
Immature Granulocytes: 0 %
Lymphocytes Absolute: 1.4 10*3/uL (ref 0.7–3.1)
Lymphs: 28 %
MCH: 31.2 pg (ref 26.6–33.0)
MCHC: 33 g/dL (ref 31.5–35.7)
MCV: 94 fL (ref 79–97)
Monocytes Absolute: 0.4 10*3/uL (ref 0.1–0.9)
Monocytes: 9 %
Neutrophils Absolute: 2.8 10*3/uL (ref 1.4–7.0)
Neutrophils: 60 %
Platelets: 234 10*3/uL (ref 150–450)
RBC: 4.43 x10E6/uL (ref 3.77–5.28)
RDW: 12.9 % (ref 11.7–15.4)
WBC: 4.8 10*3/uL (ref 3.4–10.8)

## 2020-07-18 ENCOUNTER — Other Ambulatory Visit: Payer: Self-pay | Admitting: Nurse Practitioner

## 2020-08-27 ENCOUNTER — Other Ambulatory Visit: Payer: Self-pay | Admitting: Nurse Practitioner

## 2020-08-27 NOTE — Telephone Encounter (Signed)
Patient should have enough medication to last until 11/2020.

## 2020-09-17 ENCOUNTER — Other Ambulatory Visit: Payer: Self-pay | Admitting: Nurse Practitioner

## 2020-09-17 NOTE — Telephone Encounter (Signed)
Future visit in 4  Months . Valid encounter noted .

## 2020-09-22 ENCOUNTER — Other Ambulatory Visit: Payer: Self-pay | Admitting: Nurse Practitioner

## 2020-09-22 NOTE — Telephone Encounter (Signed)
Requested medication (s) are due for refill today: no  Requested medication (s) are on the active medication list: yes  Last refill:  06/24/20 #90 with 1 refill  Future visit scheduled: yes  Notes to clinic:  [place] review for refill. Refill not delegated per protocol.     Requested Prescriptions  Pending Prescriptions Disp Refills   OLANZapine (ZYPREXA) 5 MG tablet [Pharmacy Med Name: OLANZAPINE '5MG'$  TABLETS] 30 tablet     Sig: TAKE 1 TABLET(5 MG) BY MOUTH AT BEDTIME     Not Delegated - Psychiatry:  Antipsychotics - Second Generation (Atypical) - olanzapine Failed - 09/22/2020  2:20 PM      Failed - This refill cannot be delegated      Failed - Last BP in normal range    BP Readings from Last 1 Encounters:  06/24/20 (!) 82/58          Passed - ALT in normal range and within 360 days    ALT  Date Value Ref Range Status  06/24/2020 12 0 - 32 IU/L Final   SGPT (ALT)  Date Value Ref Range Status  05/31/2013 18 12 - 78 U/L Final          Passed - AST in normal range and within 360 days    AST  Date Value Ref Range Status  06/24/2020 10 0 - 40 IU/L Final   SGOT(AST)  Date Value Ref Range Status  05/31/2013 23 15 - 77 Unit/L Final          Passed - Valid encounter within last 6 months    Recent Outpatient Visits           3 months ago Aortic atherosclerosis ([place])   [facility] McElwee, Lauren A, NP   8 months ago Major depressive disorder, recurrent severe without psychotic features ([place])   [place], Barbaraann Faster, NP   1 year ago Primary insomnia   [place], Rachel [place], [place]   3 years ago Need for Tdap vaccination   [facility] Kathrine Haddock, NP   3 years ago Other eczema   [place] Kathrine Haddock, NP       Future Appointments             In 4 months Vigg, Avanti, MD [facility]

## 2020-09-23 ENCOUNTER — Ambulatory Visit: Payer: Self-pay | Admitting: *Deleted

## 2020-09-23 NOTE — Telephone Encounter (Signed)
Reason for Disposition  [1] MODERATE headache (e.g., interferes with normal activities) AND [2] present > 24 hours AND [3] unexplained  (Exceptions: analgesics not tried, typical migraine, or headache part of viral illness)  Answer Assessment - Initial Assessment Questions 1. LOCATION: "Where does it hurt?"      Front and temporal sides of head 2. ONSET: "When did the headache start?" (Minutes, hours or days)      Yesterday  3. PATTERN: "Does the pain come and go, or has it been constant since it started?"     Constant  4. SEVERITY: "How bad is the pain?" and "What does it keep you from doing?"  (e.g., Scale 1-10; mild, moderate, or severe)   - MILD (1-3): doesn't interfere with normal activities    - MODERATE (4-7): interferes with normal activities or awakens from sleep    - SEVERE (8-10): excruciating pain, unable to do any normal activities        Moderate  5. RECURRENT SYMPTOM: "Have you ever had headaches before?" If Yes, ask: "When was the last time?" and "What happened that time?"      No  6. CAUSE: "What do you think is causing the headache?"     Not sure maybe covid  7. MIGRAINE: "Have you been diagnosed with migraine headaches?" If Yes, ask: "Is this headache similar?"      No  8. HEAD INJURY: "Has there been any recent injury to the head?"      No 9. OTHER SYMPTOMS: "Do you have any other symptoms?" (fever, stiff neck, eye pain, sore throat, cold symptoms)     Felt warm last night, chills , dizziness, nausea  10. PREGNANCY: "Is there any chance you are pregnant?" "When was your last menstrual period?"       na  Protocols used: [facility]

## 2020-09-23 NOTE — Telephone Encounter (Signed)
C/o headache frontal and temporal sides that started yesterday around 6 pm. C/o dizziness, nausea, dry cough, chills last night . Does not have thermometer to check for fever. C/o burping a lot. Denies sore throat, chest pain, difficulty breathing, no runny nose, no body aches. Encouraged patient to covid test with at home test and requesting testing in the office instead. Virtual appt scheduled for 09/25/20. Patient reports she is having pain in head that is constant. Care advise given. Patient verbalized understanding of care advise and to call back or go to [facility] or ED if symptoms worsen. Please advise if earlier appt available virtually.

## 2020-09-24 NOTE — Telephone Encounter (Signed)
Patient has appointment tomorrow

## 2020-09-25 ENCOUNTER — Encounter: Payer: Self-pay | Admitting: Nurse Practitioner

## 2020-09-25 ENCOUNTER — Telehealth (INDEPENDENT_AMBULATORY_CARE_PROVIDER_SITE_OTHER): Payer: Medicare Other | Admitting: Nurse Practitioner

## 2020-09-25 DIAGNOSIS — J069 Acute upper respiratory infection, unspecified: Secondary | ICD-10-CM

## 2020-09-25 MED ORDER — BENZONATATE 100 MG PO CAPS: 100.0000 mg | ORAL_CAPSULE | Freq: Three times a day (TID) | ORAL | 0 refills | Status: DC | PRN

## 2020-09-25 NOTE — Progress Notes (Signed)
 Acute Office Visit  Subjective:    Patient ID: Chelsey [name], female    DOB: 06/08/1950, 70 y.o.   MRN: [id number]  Chief Complaint  Patient presents with   Headache    Started on Monday, bad cough and sneezing, nasal congestion. Nausea and diarrhea. Weakness    HPI Patient is in today for headache, nasal congestion, and cough.   UPPER RESPIRATORY TRACT INFECTION Worst symptom: headache Fever: no Cough: yes Shortness of breath: no Wheezing: no Chest pain: no Chest tightness: no Chest congestion: no Nasal congestion: yes Runny nose: yes Post nasal drip: yes Sneezing: yes Sore throat: yes Swollen glands: no Sinus pressure: no Headache: yes Face pain: no Toothache: no Ear pain: no Ear pressure: no  Eyes red/itching:no Eye drainage/crusting: no  Vomiting: no Rash: no Fatigue: yes Sick contacts: no Strep contacts: no  Context: stable Recurrent sinusitis: no Relief with OTC cold/cough medications: no  Treatments attempted:  tylenol     Past Medical History:  Diagnosis Date   Breast cancer (HCC) 1997   right breast, radiation   Bronchitis    recent/ 06/09/15 had chest xray/ [facility]/resolved   Cancer (HCC) 1997   right lumpectomy,L/Ad/R    GERD (gastroesophageal reflux disease)    History of hiatal hernia    Hypercholesteremia    Hyperlipidemia    Low BP    TYPICALLY RUNS 80'S/60'S   Panic attack    Personal history of malignant neoplasm of breast     Past Surgical History:  Procedure Laterality Date   BICEPT TENODESIS Left 11/23/2016   Procedure: BICEPS TENODESIS;  Surgeon: Patel, Sunny, MD;  Location: [facility];  Service: Orthopedics;  Laterality: Left;   BREAST EXCISIONAL BIOPSY Right 1997   pos   BREAST SURGERY Right 1997   lumpectomy   CHOLECYSTECTOMY N/A 08/06/2016   Procedure: LAPAROSCOPIC CHOLECYSTECTOMY WITH INTRAOPERATIVE CHOLANGIOGRAM;  Surgeon: Sankar, Seeplaputhur G, MD;  Location: [facility];  Service: General;  Laterality:  N/A;   COLONOSCOPY  2008   COLONOSCOPY WITH PROPOFOL N/A 07/21/2015   Procedure: COLONOSCOPY WITH PROPOFOL;  Surgeon: Darren Wohl, MD;  Location: [facility];  Service: Endoscopy;  Laterality: N/A;   COLONOSCOPY WITH PROPOFOL N/A 01/05/2018   Procedure: COLONOSCOPY WITH PROPOFOL;  Surgeon: Anna, Kiran, MD;  Location: [facility];  Service: Gastroenterology;  Laterality: N/A;   ESOPHAGOGASTRODUODENOSCOPY (EGD) WITH PROPOFOL N/A 06/16/2016   Procedure: ESOPHAGOGASTRODUODENOSCOPY (EGD) WITH PROPOFOL;  Surgeon: Sankar, Seeplaputhur G, MD;  Location: [facility];  Service: Endoscopy;  Laterality: N/A;   ESOPHAGOGASTRODUODENOSCOPY (EGD) WITH PROPOFOL N/A 01/05/2018   Procedure: ESOPHAGOGASTRODUODENOSCOPY (EGD) WITH PROPOFOL;  Surgeon: Anna, Kiran, MD;  Location: [facility];  Service: Gastroenterology;  Laterality: N/A;   ESOPHAGOGASTRODUODENOSCOPY (EGD) WITH PROPOFOL N/A 06/13/2018   Procedure: ESOPHAGOGASTRODUODENOSCOPY (EGD) WITH PROPOFOL;  Surgeon: Anna, Kiran, MD;  Location: [facility];  Service: Gastroenterology;  Laterality: N/A;   ESOPHAGOGASTRODUODENOSCOPY (EGD) WITH PROPOFOL N/A 07/16/2018   Procedure: ESOPHAGOGASTRODUODENOSCOPY (EGD) WITH PROPOFOL;  Surgeon: Wohl, Darren, MD;  Location: [facility];  Service: Endoscopy;  Laterality: N/A;   HARDWARE REMOVAL Left 12/27/2016   Procedure: HARDWARE REMOVAL LEFT  PROXIMAL HUMEROUS;  Surgeon: Patel, Sunny, MD;  Location: [facility];  Service: Orthopedics;  Laterality: Left;   IR GJ TUBE CHANGE  04/21/2018   NISSEN FUNDOPLICATION N/A 03/09/2018   Procedure: NISSEN FUNDOPLICATION;  Surgeon: Pabon, Diego F, MD;  Location: [facility];  Service: General;  Laterality: N/A;   ORIF HUMERUS FRACTURE Left 11/23/2016   Procedure: OPEN   REDUCTION INTERNAL FIXATION (ORIF) PROXIMAL HUMERUS FRACTURE;  Surgeon: Patel, Sunny, MD;  Location: [facility];  Service: Orthopedics;  Laterality: Left;   REPAIR OF ESOPHAGUS  03/09/2018   Procedure: REPAIR OF  ESOPHAGUS;  Surgeon: Pabon, Diego F, MD;  Location: [facility];  Service: General;;   REVERSE SHOULDER ARTHROPLASTY Left 12/27/2016   Procedure: REVERSE SHOULDER ARTHROPLASTY;  Surgeon: Patel, Sunny, MD;  Location: [facility];  Service: Orthopedics;  Laterality: Left;   ROBOTIC ASSISTED LAPAROSCOPIC REPAIR OF PARAESOPHAGEAL HERNIA N/A 03/09/2018   Procedure: ROBOTIC HIATAL HERNIA CONVERTED TO OPEN;  Surgeon: Pabon, Diego F, MD;  Location: [facility];  Service: General;  Laterality: N/A;    Family History  Problem Relation Age of Onset   Anxiety disorder Brother    Obesity Brother    COPD Brother    Kidney disease Mother    Heart attack Father    Hypertension Father    Alcohol abuse Father    Depression Brother    Anxiety disorder Brother    Breast cancer Maternal Grandmother     Social History   Socioeconomic History   Marital status: Widowed    Spouse name: Not on file   Number of children: 2   Years of education: Not on file   Highest education level: Bachelor's degree (e.g., BA, AB, BS)  Occupational History   Not on file  Tobacco Use   Smoking status: Former    Packs/day: 0.10    Years: 10.00    Pack years: 1.00    Types: Cigarettes    Start date: 11/25/2009    Quit date: 10/22/2019    Years since quitting: 0.9   Smokeless tobacco: Never   Tobacco comments:    1 cigarette daily  Vaping Use   Vaping Use: Never used  Substance and Sexual Activity   Alcohol use: No    Alcohol/week: 0.0 standard drinks   Drug use: No   Sexual activity: Never  Other Topics Concern   Not on file  Social History Narrative   Not on file   Social Determinants of Health   Financial Resource Strain: Not on file  Food Insecurity: Not on file  Transportation Needs: Not on file  Physical Activity: Not on file  Stress: Not on file  Social Connections: Not on file  Intimate Partner Violence: Not on file    Outpatient Medications Prior to Visit  Medication Sig Dispense Refill   buPROPion  (WELLBUTRIN XL) 300 MG 24 hr tablet TAKE 1 TABLET(300 MG) BY MOUTH DAILY 90 tablet 1   busPIRone (BUSPAR) 10 MG tablet TAKE 1 TABLET(10 MG) BY MOUTH THREE TIMES DAILY 270 tablet 0   docusate sodium (COLACE) 100 MG capsule Take 1 capsule (100 mg total) by mouth 2 (two) times daily. 90 capsule 1   OLANZapine (ZYPREXA) 5 MG tablet Take 1 tablet (5 mg total) by mouth at bedtime. 90 tablet 1   pantoprazole (PROTONIX) 40 MG tablet TAKE 1 TABLET(40 MG) BY MOUTH DAILY 90 tablet 1   polyethylene glycol (MIRALAX / GLYCOLAX) 17 g packet Take 17 g by mouth daily. 30 each 1   trazodone (DESYREL) 300 MG tablet Take 1 tablet (300 mg total) by mouth at bedtime. 90 tablet 1   No facility-administered medications prior to visit.    No Known Allergies  Review of Systems  Constitutional:  Positive for fatigue. Negative for fever.  HENT:  Positive for congestion, postnasal drip, rhinorrhea and sore throat. Negative for ear pain.     Eyes: Negative.   Respiratory:  Positive for cough. Negative for shortness of breath.   Cardiovascular: Negative.   Gastrointestinal:  Positive for diarrhea and nausea. Negative for abdominal pain.  Genitourinary: Negative.   Musculoskeletal:  Positive for myalgias.  Skin: Negative.   Neurological: Negative.       Objective:    Physical Exam Vitals and nursing note reviewed.  Pulmonary:     Comments: Able to talk in complete sentences Neurological:     Mental Status: She is oriented to person, place, and time.  Psychiatric:        Thought Content: Thought content normal.    LMP  (LMP Unknown)  Wt Readings from Last 3 Encounters:  06/24/20 177 lb (80.3 kg)  01/21/20 168 lb 6 oz (76.4 kg)  09/24/19 120 lb (54.4 kg)    Health Maintenance Due  Topic Date Due   Zoster Vaccines- Shingrix (1 of 2) Never done   MAMMOGRAM  03/05/2018   COVID-19 Vaccine (3 - Pfizer risk series) 11/24/2019   INFLUENZA VACCINE  08/25/2020    There are no preventive care reminders to  display for this patient.   Lab Results  Component Value Date   TSH 2.880 01/21/2020   Lab Results  Component Value Date   WBC 4.8 06/24/2020   HGB 13.8 06/24/2020   HCT 41.8 06/24/2020   MCV 94 06/24/2020   PLT 234 06/24/2020   Lab Results  Component Value Date   NA 139 06/24/2020   K 4.4 06/24/2020   CO2 21 06/24/2020   GLUCOSE 94 06/24/2020   BUN 13 06/24/2020   CREATININE 0.76 06/24/2020   BILITOT 0.3 06/24/2020   ALKPHOS 117 06/24/2020   AST 10 06/24/2020   ALT 12 06/24/2020   PROT 6.1 06/24/2020   ALBUMIN 3.8 06/24/2020   CALCIUM 8.8 06/24/2020   ANIONGAP 11 09/24/2019   EGFR 85 06/24/2020   Lab Results  Component Value Date   CHOL 245 (H) 06/24/2020   Lab Results  Component Value Date   HDL 52 06/24/2020   Lab Results  Component Value Date   LDLCALC 165 (H) 06/24/2020   Lab Results  Component Value Date   TRIG 153 (H) 06/24/2020   Lab Results  Component Value Date   CHOLHDL 3.2 10/02/2019   No results found for: HGBA1C     Assessment & Plan:   Problem List Items Addressed This Visit   None Visit Diagnoses     Upper respiratory tract infection, unspecified type    -  Primary   Will test for covid-19 today. encouraged fluids, rest, isolation until test results. Tylenol/ibuprofen prn headache. Tessalon prn cough   Relevant Orders   Novel Coronavirus, NAA ([facility])        Meds ordered this encounter  Medications   benzonatate (TESSALON) 100 MG capsule    Sig: Take 1-2 capsules (100-200 mg total) by mouth 3 (three) times daily as needed for cough.    Dispense:  20 capsule    Refill:  0    This visit was completed via telephone due to the restrictions of the COVID-19 pandemic. All issues as above were discussed and addressed but no physical exam was performed. If it was felt that the patient should be evaluated in the office, they were directed there. The patient verbally consented to this visit. Patient was unable to complete an  audio/visual visit due to Technical difficulties", "Lack of internet. Due to the catastrophic nature of the COVID-19 pandemic,   this visit was done through audio contact only. Location of the patient: home Location of the provider: work Those involved with this call:  Provider:  , DNP CMA: Tiffany Reel, CMA Front Desk/Registration:  Shannon Levens   Time spent on call:  10 minutes on the phone discussing health concerns. 10 minutes total spent in review of patient's record and preparation of their chart.    A , NP  

## 2020-09-26 LAB — SARS-COV-2, NAA 2 DAY TAT

## 2020-09-26 LAB — NOVEL CORONAVIRUS, NAA: SARS-CoV-2, NAA: DETECTED — AB

## 2020-09-26 MED ORDER — MOLNUPIRAVIR EUA 200MG CAPSULE: 4.0000 | ORAL_CAPSULE | Freq: Two times a day (BID) | ORAL | 0 refills | Status: AC

## 2020-09-26 NOTE — Addendum Note (Signed)
Addended by: Vance Peper A on: 09/26/2020 04:51 PM   Modules accepted: Orders

## 2020-10-14 ENCOUNTER — Telehealth: Payer: Self-pay

## 2020-10-14 ENCOUNTER — Telehealth: Payer: Self-pay | Admitting: Internal Medicine

## 2020-10-14 NOTE — Telephone Encounter (Signed)
Left patient a VM requesting a call back to schedule Medicare AWV

## 2020-10-14 NOTE — Telephone Encounter (Signed)
Copied from [place] [phone]. Topic: Medicare AWV >> Oct 14, 2020  2:15 PM Lavonia Drafts wrote: Reason for CRM:  Left message for patient to call back and schedule Medicare Annual Wellness Visit (AWV) to be done virtually or by telephone.  No hx of AWV eligible as of 11/26/19  Please schedule at anytime with [facility]Nurse Health Advisor.      38 Minutes appointment   Any questions, please call me at [phone]

## 2020-10-19 ENCOUNTER — Ambulatory Visit: Payer: Medicare Other

## 2020-10-19 ENCOUNTER — Telehealth: Payer: Self-pay

## 2020-10-19 NOTE — Telephone Encounter (Signed)
Left the patient a VM requesting she call to get scheduled for her Medicare AWV

## 2020-10-22 ENCOUNTER — Ambulatory Visit (INDEPENDENT_AMBULATORY_CARE_PROVIDER_SITE_OTHER): Payer: Medicare Other

## 2020-10-22 ENCOUNTER — Other Ambulatory Visit: Payer: Self-pay | Admitting: Internal Medicine

## 2020-10-22 DIAGNOSIS — Z0000 Encounter for general adult medical examination without abnormal findings: Secondary | ICD-10-CM | POA: Diagnosis not present

## 2020-10-22 NOTE — Progress Notes (Signed)
Subjective:   Chelsey [name] is a 70 y.o. female who presents for an Initial Medicare Annual Wellness Visit.I connected with  Chelsey [name] on 10/22/20 by a audio enabled telemedicine application and verified that I am speaking with the correct person using two identifiers.   I discussed the limitations of evaluation and management by telemedicine. The patient expressed understanding and agreed to proceed.   Location of patient:home Location of provider:office [place] completed visit with Chelsey [name], CMA  Review of Systems    Defer to PCP       Objective:    There were no vitals filed for this visit. There is no height or weight on file to calculate BMI.  Advanced Directives 09/24/2019 07/16/2018 07/14/2018 07/14/2018 06/13/2018 04/21/2018 04/11/2018  Does Patient Have a Medical Advance Directive? No No No No No No No  Would patient like information on creating a medical advance directive? - - No - Patient declined - - No - Patient declined No - Patient declined  Some encounter information is confidential and restricted. Go to Review Flowsheets activity to see all data.    Current Medications (verified) Outpatient Encounter Medications as of 10/22/2020  Medication Sig   buPROPion (WELLBUTRIN XL) 300 MG 24 hr tablet TAKE 1 TABLET(300 MG) BY MOUTH DAILY   busPIRone (BUSPAR) 10 MG tablet TAKE 1 TABLET(10 MG) BY MOUTH THREE TIMES DAILY   docusate sodium (COLACE) 100 MG capsule Take 1 capsule (100 mg total) by mouth 2 (two) times daily.   OLANZapine (ZYPREXA) 5 MG tablet Take 1 tablet (5 mg total) by mouth at bedtime.   pantoprazole (PROTONIX) 40 MG tablet TAKE 1 TABLET(40 MG) BY MOUTH DAILY   polyethylene glycol (MIRALAX / GLYCOLAX) 17 g packet Take 17 g by mouth daily.   trazodone (DESYREL) 300 MG tablet Take 1 tablet (300 mg total) by mouth at bedtime.   benzonatate (TESSALON) 100 MG capsule Take 1-2 capsules (100-200 mg total) by mouth 3 (three) times daily as needed for cough.  (Patient not taking: Reported on 10/22/2020)   No facility-administered encounter medications on file as of 10/22/2020.    Allergies (verified) Patient has no known allergies.   History: Past Medical History:  Diagnosis Date   Breast cancer ([place]) 1997   right breast, radiation   Bronchitis    recent/ 06/09/15 had chest xray/ [facility]/resolved   Cancer [facility]) 1997   right lumpectomy,L/Ad/R    GERD (gastroesophageal reflux disease)    History of hiatal hernia    Hypercholesteremia    Hyperlipidemia    Low BP    TYPICALLY RUNS 80'S/60'S   Panic attack    Personal history of malignant neoplasm of breast    Past Surgical History:  Procedure Laterality Date   BICEPT TENODESIS Left 11/23/2016   Procedure: BICEPS TENODESIS;  Surgeon: Leim Fabry, MD;  Location: [facility];  Service: Orthopedics;  Laterality: Left;   BREAST EXCISIONAL BIOPSY Right 1997   pos   BREAST SURGERY Right 1997   lumpectomy   CHOLECYSTECTOMY N/A 08/06/2016   Procedure: LAPAROSCOPIC CHOLECYSTECTOMY WITH INTRAOPERATIVE CHOLANGIOGRAM;  Surgeon: Christene Lye, MD;  Location: [facility];  Service: General;  Laterality: N/A;   COLONOSCOPY  2008   COLONOSCOPY WITH PROPOFOL N/A 07/21/2015   Procedure: COLONOSCOPY WITH PROPOFOL;  Surgeon: Lucilla Lame, MD;  Location: [place];  Service: Endoscopy;  Laterality: N/A;   COLONOSCOPY WITH PROPOFOL N/A 01/05/2018   Procedure: COLONOSCOPY WITH PROPOFOL;  Surgeon: Jonathon Bellows, MD;  Location: [facility];  Service: Gastroenterology;  Laterality: N/A;   ESOPHAGOGASTRODUODENOSCOPY (EGD) WITH PROPOFOL N/A 06/16/2016   Procedure: ESOPHAGOGASTRODUODENOSCOPY (EGD) WITH PROPOFOL;  Surgeon: Christene Lye, MD;  Location: [facility];  Service: Endoscopy;  Laterality: N/A;   ESOPHAGOGASTRODUODENOSCOPY (EGD) WITH PROPOFOL N/A 01/05/2018   Procedure: ESOPHAGOGASTRODUODENOSCOPY (EGD) WITH PROPOFOL;  Surgeon: Jonathon Bellows, MD;  Location: [facility] ENDOSCOPY;   Service: Gastroenterology;  Laterality: N/A;   ESOPHAGOGASTRODUODENOSCOPY (EGD) WITH PROPOFOL N/A 06/13/2018   Procedure: ESOPHAGOGASTRODUODENOSCOPY (EGD) WITH PROPOFOL;  Surgeon: Jonathon Bellows, MD;  Location: [facility] ENDOSCOPY;  Service: Gastroenterology;  Laterality: N/A;   ESOPHAGOGASTRODUODENOSCOPY (EGD) WITH PROPOFOL N/A 07/16/2018   Procedure: ESOPHAGOGASTRODUODENOSCOPY (EGD) WITH PROPOFOL;  Surgeon: Lucilla Lame, MD;  Location: [facility] ENDOSCOPY;  Service: Endoscopy;  Laterality: N/A;   HARDWARE REMOVAL Left 12/27/2016   Procedure: HARDWARE REMOVAL LEFT  PROXIMAL HUMEROUS;  Surgeon: Leim Fabry, MD;  Location: [facility];  Service: Orthopedics;  Laterality: Left;   IR GJ TUBE CHANGE  07/21/9483   NISSEN FUNDOPLICATION N/A 4/62/7035   Procedure: NISSEN FUNDOPLICATION;  Surgeon: Jules Husbands, MD;  Location: [facility];  Service: General;  Laterality: N/A;   ORIF HUMERUS FRACTURE Left 11/23/2016   Procedure: OPEN REDUCTION INTERNAL FIXATION (ORIF) PROXIMAL HUMERUS FRACTURE;  Surgeon: Leim Fabry, MD;  Location: [facility];  Service: Orthopedics;  Laterality: Left;   REPAIR OF ESOPHAGUS  03/09/2018   Procedure: REPAIR OF ESOPHAGUS;  Surgeon: Jules Husbands, MD;  Location: [facility];  Service: General;;   REVERSE SHOULDER ARTHROPLASTY Left 12/27/2016   Procedure: REVERSE SHOULDER ARTHROPLASTY;  Surgeon: Leim Fabry, MD;  Location: [facility];  Service: Orthopedics;  Laterality: Left;   ROBOTIC ASSISTED LAPAROSCOPIC REPAIR OF PARAESOPHAGEAL HERNIA N/A 03/09/2018   Procedure: ROBOTIC HIATAL HERNIA CONVERTED TO OPEN;  Surgeon: Jules Husbands, MD;  Location: [facility];  Service: General;  Laterality: N/A;   Family History  Problem Relation Age of Onset   Anxiety disorder Brother    Obesity Brother    COPD Brother    Kidney disease Mother    Heart attack Father    Hypertension Father    Alcohol abuse Father    Depression Brother    Anxiety disorder Brother    Breast cancer Maternal Grandmother    Social  History   Socioeconomic History   Marital status: Widowed    Spouse name: Not on file   Number of children: 2   Years of education: Not on file   Highest education level: Bachelor's degree (e.g., BA, AB, BS)  Occupational History   Not on file  Tobacco Use   Smoking status: Former    Packs/day: 0.10    Years: 10.00    Pack years: 1.00    Types: Cigarettes    Start date: 11/25/2009    Quit date: 10/22/2019    Years since quitting: 1.0   Smokeless tobacco: Never   Tobacco comments:    1 cigarette daily  Vaping Use   Vaping Use: Never used  Substance and Sexual Activity   Alcohol use: No    Alcohol/week: 0.0 standard drinks   Drug use: No   Sexual activity: Never  Other Topics Concern   Not on file  Social History Narrative   Not on file   Social Determinants of Health   Financial Resource Strain: Medium Risk   Difficulty of Paying Living Expenses: Somewhat hard  Food Insecurity: No Food Insecurity   Worried About Running Out of Food in the Last Year: Never true  Ran Out of Food in the Last Year: Never true  Transportation Needs: No Transportation Needs   Lack of Transportation (Medical): No   Lack of Transportation (Non-Medical): No  Physical Activity: Insufficiently Active   Days of Exercise per Week: 1 day   Minutes of Exercise per Session: 20 min  Stress: Stress Concern Present   Feeling of Stress : To some extent  Social Connections: Socially Isolated   Frequency of Communication with Friends and Family: Twice a week   Frequency of Social Gatherings with Friends and Family: Once a week   Attends Religious Services: Never   Marine scientist or Organizations: No   Attends Archivist Meetings: Never   Marital Status: Widowed    Tobacco Counseling Counseling given: Not Answered Tobacco comments: 1 cigarette daily   Clinical Intake:  Pre-visit preparation completed: Yes  Pain : No/denies pain     Nutritional Risks: None Diabetes:  No  How often do you need to have someone help you when you read instructions, pamphlets, or other written materials from your doctor or pharmacy?: 1 - Never What is the last grade level you completed in school?: 4 years of college  Diabetic?no  Interpreter Needed?: No  Information entered by :: Chelsey [name], [facility]   Activities of Daily Living In your present state of health, do you have any difficulty performing the following activities: 09/25/2020  Hearing? N  Vision? N  Difficulty concentrating or making decisions? N  Walking or climbing stairs? N  Dressing or bathing? N  Doing errands, shopping? N  Some recent data might be hidden    Patient Care Team: Charlynne Cousins, MD as PCP - General Christene Lye, MD (General Surgery) Guadalupe Maple, MD (Family Medicine)  Indicate any recent Medical Services you may have received from other than [facility] providers in the past year (date may be approximate).     Assessment:   This is a routine wellness examination for [place].  Hearing/Vision screen No results found.  Dietary issues and exercise activities discussed:     Goals Addressed   None    Depression Screen PHQ 2/9 Scores 09/25/2020 06/24/2020 01/21/2020 07/12/2019 07/27/2017 08/17/2016 07/05/2016  PHQ - 2 Score 0 0 0 1 2 1  0  PHQ- 9 Score 0 2 4 3 6 4  0    Fall Risk Fall Risk  09/25/2020 06/24/2020 07/12/2019 07/12/2019 07/10/2018  Falls in the past year? 0 0 0 0 0  Number falls in past yr: 0 0 0 0 0  Injury with Fall? 0 0 1 0 0  Comment - - left shoulder on 2018 - -  Risk for fall due to : No Fall Risks No Fall Risks - - -  Follow up Falls evaluation completed Falls evaluation completed - - -    FALL RISK PREVENTION PERTAINING TO THE HOME:  Any stairs in or around the home? Yes  If so, are there any without handrails? No  Home free of loose throw rugs in walkways, pet beds, electrical cords, etc? No  Adequate lighting in your home to reduce risk of falls? Yes    ASSISTIVE DEVICES UTILIZED TO PREVENT FALLS:  Life alert? No  Use of a cane, [place] No or w/c? No  Grab bars in the bathroom? No  Shower chair or bench in shower? No  Elevated toilet seat or a handicapped toilet? No   TIMED UP AND GO:  Was the test performed?  N/A .  Length of  time to ambulate 10 feet: N/A sec.     Cognitive Function:        Immunizations Immunization History  Administered Date(s) Administered   Fluad Quad(high Dose 65+) 01/21/2020   Influenza, High Dose Seasonal PF 10/28/2017   Influenza,inj,Quad PF,6+ Mos 01/05/2016   Influenza-Unspecified 11/20/2014, 10/18/2016   PFIZER(Purple Top)SARS-COV-2 Vaccination 09/27/2019, 10/27/2019   Pneumococcal Conjugate-13 07/05/2016   Pneumococcal Polysaccharide-23 03/10/2018   Td 02/14/2007   Tdap 07/27/2017   Zoster, Live 05/29/2013    TDAP status: Up to date  Flu Vaccine status: Due, Education has been provided regarding the importance of this vaccine. Advised may receive this vaccine at local pharmacy or Health Dept. Aware to provide a copy of the vaccination record if obtained from local pharmacy or Health Dept. Verbalized acceptance and understanding.  Pneumococcal vaccine status: Due, Education has been provided regarding the importance of this vaccine. Advised may receive this vaccine at local pharmacy or Health Dept. Aware to provide a copy of the vaccination record if obtained from local pharmacy or Health Dept. Verbalized acceptance and understanding.  Covid-19 vaccine status: Completed vaccines  Qualifies for Shingles Vaccine? Yes   Zostavax completed No   Shingrix Completed?: No.    Education has been provided regarding the importance of this vaccine. Patient has been advised to call insurance company to determine out of pocket expense if they have not yet received this vaccine. Advised may also receive vaccine at local pharmacy or Health Dept. Verbalized acceptance and understanding.  Screening  Tests Health Maintenance  Topic Date Due   Zoster Vaccines- Shingrix (1 of 2) Never done   MAMMOGRAM  03/05/2018   COVID-19 Vaccine (3 - Pfizer risk series) 11/24/2019   INFLUENZA VACCINE  08/25/2020   COLONOSCOPY (Pts 45-35yrs Insurance coverage will need to be confirmed)  01/06/2023   TETANUS/TDAP  07/28/2027   DEXA SCAN  Completed   Hepatitis C Screening  Completed   HPV VACCINES  Aged Out    Health Maintenance  Health Maintenance Due  Topic Date Due   Zoster Vaccines- Shingrix (1 of 2) Never done   MAMMOGRAM  03/05/2018   COVID-19 Vaccine (3 - Pfizer risk series) 11/24/2019   INFLUENZA VACCINE  08/25/2020    Colorectal cancer screening: Type of screening: Colonoscopy. Completed 01/05/18. Repeat every 5 years/  Mammogram status: Ordered 10/22/20. Pt provided with contact info and advised to call to schedule appt.   Bone Density status: Completed 08/09/17. Results reflect: Bone density results: NORMAL. Repeat every   years.  Lung Cancer Screening: (Low Dose CT Chest recommended if Age 60-80 years, 30 pack-year currently smoking OR have quit w/in 15years.) does qualify.   Lung Cancer Screening Referral: defer to PCP Additional Screening:  Hepatitis C Screening: does not qualify; Completed 07/04/15  Vision Screening: Recommended annual ophthalmology exams for early detection of glaucoma and other disorders of the eye. Is the patient up to date with their annual eye exam?  Yes  Who is the provider or what is the name of the office in which the patient attends annual eye exams? yearly If pt is not established with a provider, would they like to be referred to a provider to establish care?  N/A .   Dental Screening: Recommended annual dental exams for proper oral hygiene  Community Resource Referral / Chronic Care Management: CRR required this visit?  No   CCM required this visit?  No      Plan:     I have personally reviewed  and noted the following in the patient's  chart:   Medical and social history Use of alcohol, tobacco or illicit drugs  Current medications and supplements including opioid prescriptions. Patient is not currently taking opioid prescriptions. Functional ability and status Nutritional status Physical activity Advanced directives List of other physicians Hospitalizations, surgeries, and ER visits in previous 12 months Vitals Screenings to include cognitive, depression, and falls Referrals and appointments  In addition, I have reviewed and discussed with patient certain preventive protocols, quality metrics, and best practice recommendations. A written personalized care plan for preventive services as well as general preventive health recommendations were provided to patient.     Chelsey [name], [place]   10/22/2020   Nurse Notes: non face to face 60 minutes  Ms. Depaz , Thank you for taking time to come for your Medicare Wellness Visit. I appreciate your ongoing commitment to your health goals. Please review the following plan we discussed and let me know if I can assist you in the future.  I ordered MM-no other referrals requested  These are the goals we discussed:  Goals   None     This is a list of the screening recommended for you and due dates:  Health Maintenance  Topic Date Due   Zoster (Shingles) Vaccine (1 of 2) Never done   Mammogram  03/05/2018   COVID-19 Vaccine (3 - Pfizer risk series) 11/24/2019   Flu Shot  08/25/2020   Colon Cancer Screening  01/06/2023   Tetanus Vaccine  07/28/2027   DEXA scan (bone density measurement)  Completed   Hepatitis C Screening: USPSTF Recommendation to screen - Ages 34-79 yo.  Completed   HPV Vaccine  Aged Out

## 2020-10-22 NOTE — Patient Instructions (Signed)
Health Maintenance, Female Adopting a healthy lifestyle and getting preventive care are important in promoting health and wellness. Ask your health care provider about: The right schedule for you to have regular tests and exams. Things you can do on your own to prevent diseases and keep yourself healthy. What should I know about diet, weight, and exercise? Eat a healthy diet  Eat a diet that includes plenty of vegetables, fruits, low-fat dairy products, and lean protein. Do not eat a lot of foods that are high in solid fats, added sugars, or sodium. Maintain a healthy weight Body mass index (BMI) is used to identify weight problems. It estimates body fat based on height and weight. Your health care provider can help determine your BMI and help you achieve or maintain a healthy weight. Get regular exercise Get regular exercise. This is one of the most important things you can do for your health. Most adults should: Exercise for at least 150 minutes each week. The exercise should increase your heart rate and make you sweat (moderate-intensity exercise). Do strengthening exercises at least twice a week. This is in addition to the moderate-intensity exercise. Spend less time sitting. Even light physical activity can be beneficial. Watch cholesterol and blood lipids Have your blood tested for lipids and cholesterol at 69 years of age, then have this test every 5 years. Have your cholesterol levels checked more often if: Your lipid or cholesterol levels are high. You are older than 69 years of age. You are at high risk for heart disease. What should I know about cancer screening? Depending on your health history and family history, you may need to have cancer screening at various ages. This may include screening for: Breast cancer. Cervical cancer. Colorectal cancer. Skin cancer. Lung cancer. What should I know about heart disease, diabetes, and high blood pressure? Blood pressure and heart  disease High blood pressure causes heart disease and increases the risk of stroke. This is more likely to develop in people who have high blood pressure readings, are of African descent, or are overweight. Have your blood pressure checked: Every 3-5 years if you are 18-39 years of age. Every year if you are 40 years old or older. Diabetes Have regular diabetes screenings. This checks your fasting blood sugar level. Have the screening done: Once every three years after age 40 if you are at a normal weight and have a low risk for diabetes. More often and at a younger age if you are overweight or have a high risk for diabetes. What should I know about preventing infection? Hepatitis B If you have a higher risk for hepatitis B, you should be screened for this virus. Talk with your health care provider to find out if you are at risk for hepatitis B infection. Hepatitis C Testing is recommended for: Everyone born from 1945 through 1965. Anyone with known risk factors for hepatitis C. Sexually transmitted infections (STIs) Get screened for STIs, including gonorrhea and chlamydia, if: You are sexually active and are younger than 69 years of age. You are older than 69 years of age and your health care provider tells you that you are at risk for this type of infection. Your sexual activity has changed since you were last screened, and you are at increased risk for chlamydia or gonorrhea. Ask your health care provider if you are at risk. Ask your health care provider about whether you are at high risk for HIV. Your health care provider may recommend a prescription medicine   to help prevent HIV infection. If you choose to take medicine to prevent HIV, you should first get tested for HIV. You should then be tested every 3 months for as long as you are taking the medicine. Pregnancy If you are about to stop having your period (premenopausal) and you may become pregnant, seek counseling before you get  pregnant. Take 400 to 800 micrograms (mcg) of folic acid every day if you become pregnant. Ask for birth control (contraception) if you want to prevent pregnancy. Osteoporosis and menopause Osteoporosis is a disease in which the bones lose minerals and strength with aging. This can result in bone fractures. If you are 65 years old or older, or if you are at risk for osteoporosis and fractures, ask your health care provider if you should: Be screened for bone loss. Take a calcium or vitamin D supplement to lower your risk of fractures. Be given hormone replacement therapy (HRT) to treat symptoms of menopause. Follow these instructions at home: Lifestyle Do not use any products that contain nicotine or tobacco, such as cigarettes, e-cigarettes, and chewing tobacco. If you need help quitting, ask your health care provider. Do not use street drugs. Do not share needles. Ask your health care provider for help if you need support or information about quitting drugs. Alcohol use Do not drink alcohol if: Your health care provider tells you not to drink. You are pregnant, may be pregnant, or are planning to become pregnant. If you drink alcohol: Limit how much you use to 0-1 drink a day. Limit intake if you are breastfeeding. Be aware of how much alcohol is in your drink. In the [place] one drink equals one 12 oz bottle of beer (355 mL), one 5 oz glass of wine (148 mL), or one 1 oz glass of hard liquor (44 mL). General instructions Schedule regular health, dental, and eye exams. Stay current with your vaccines. Tell your health care provider if: You often feel depressed. You have ever been abused or do not feel safe at home. Summary Adopting a healthy lifestyle and getting preventive care are important in promoting health and wellness. Follow your health care provider's instructions about healthy diet, exercising, and getting tested or screened for diseases. Follow your health care provider's  instructions on monitoring your cholesterol and blood pressure. This information is not intended to replace advice given to you by your health care provider. Make sure you discuss any questions you have with your health care provider. Document Revised: 03/21/2020 Document Reviewed: 01/04/2018 Elsevier Patient Education  2022 Elsevier Inc.  

## 2020-11-26 ENCOUNTER — Other Ambulatory Visit: Payer: Self-pay | Admitting: Internal Medicine

## 2020-11-26 NOTE — Telephone Encounter (Signed)
Requested Prescriptions  Pending Prescriptions Disp Refills  . buPROPion (WELLBUTRIN XL) 300 MG 24 hr tablet [Pharmacy Med Name: BUPROPION XL 300MG  TABLETS] 90 tablet 0    Sig: TAKE 1 TABLET(300 MG) BY MOUTH DAILY     Psychiatry: Antidepressants - bupropion Failed - 11/26/2020  8:09 AM      Failed - Last BP in normal range    BP Readings from Last 1 Encounters:  06/24/20 (!) 82/58         Passed - Completed PHQ-2 or PHQ-9 in the last 360 days      Passed - Valid encounter within last 6 months    Recent Outpatient Visits          2 months ago Upper respiratory tract infection, unspecified type   [facility] Mc[place]we[place], Lauren A, NP   5 months ago Aortic atherosclerosis ([place])   [place], Lauren A, NP   10 months ago Major depressive disorder, recurrent severe without psychotic features ([place])   [place], Barbaraann Faster, NP   1 year ago Primary insomnia   [facility] [place] Volney American, [place]   3 years ago Need for Tdap vaccination   [facility] Kathrine Haddock, NP      Future Appointments            In 1 month Vigg, Avanti, MD [facility] [place]

## 2020-12-04 ENCOUNTER — Other Ambulatory Visit: Payer: Self-pay | Admitting: Nurse Practitioner

## 2020-12-04 NOTE — Telephone Encounter (Signed)
Requested Prescriptions  Pending Prescriptions Disp Refills  . trazodone (DESYREL) 300 MG tablet [Pharmacy Med Name: TRAZODONE 300MG  TABLETS] 90 tablet 0    Sig: TAKE 1 TABLET(300 MG) BY MOUTH AT BEDTIME     Psychiatry: Antidepressants - Serotonin Modulator Passed - 12/04/2020  8:09 AM      Passed - Completed PHQ-2 or PHQ-9 in the last 360 days      Passed - Valid encounter within last 6 months    Recent Outpatient Visits          2 months ago Upper respiratory tract infection, unspecified type   [place], Lauren A, NP   5 months ago Aortic atherosclerosis ([place])   [place], Lauren A, NP   10 months ago Major depressive disorder, recurrent severe without psychotic features ([place])   [place], Barbaraann Faster, NP   1 year ago Primary insomnia   [facility] Volney American, [place]   3 years ago Need for Tdap vaccination   [facility] Kathrine Haddock, NP      Future Appointments            In 1 month Vigg, Avanti, MD [facility], [place]

## 2020-12-13 ENCOUNTER — Other Ambulatory Visit: Payer: Self-pay | Admitting: Nurse Practitioner

## 2020-12-13 NOTE — Telephone Encounter (Signed)
Requested Prescriptions  Pending Prescriptions Disp Refills  . busPIRone (BUSPAR) 10 MG tablet [Pharmacy Med Name: BUSPIRONE 10MG  TABLETS] 270 tablet 0    Sig: TAKE 1 TABLET(10 MG) BY MOUTH THREE TIMES DAILY     Psychiatry: Anxiolytics/Hypnotics - Non-controlled Passed - 12/13/2020 11:44 AM      Passed - Valid encounter within last 6 months    Recent Outpatient Visits          2 months ago Upper respiratory tract infection, unspecified type   [place], Lauren A, NP   5 months ago Aortic atherosclerosis ([place])   [place], Lauren A, NP   10 months ago Major depressive disorder, recurrent severe without psychotic features ([place])   [place], Barbaraann Faster, NP   1 year ago Primary insomnia   [facility] Volney American, [place]   3 years ago Need for Tdap vaccination   [facility] Kathrine Haddock, NP      Future Appointments            In 1 month Vigg, Avanti, MD [facility], [place]

## 2021-01-22 ENCOUNTER — Other Ambulatory Visit: Payer: Self-pay

## 2021-01-22 ENCOUNTER — Encounter: Payer: Self-pay | Admitting: Internal Medicine

## 2021-01-22 ENCOUNTER — Ambulatory Visit (INDEPENDENT_AMBULATORY_CARE_PROVIDER_SITE_OTHER): Payer: Medicare Other | Admitting: Internal Medicine

## 2021-01-22 VITALS — BP 118/86 | HR 86 | Temp 98.3°F | Ht 65.35 in | Wt 181.2 lb

## 2021-01-22 DIAGNOSIS — F419 Anxiety disorder, unspecified: Secondary | ICD-10-CM

## 2021-01-22 DIAGNOSIS — Z23 Encounter for immunization: Secondary | ICD-10-CM

## 2021-01-22 DIAGNOSIS — F339 Major depressive disorder, recurrent, unspecified: Secondary | ICD-10-CM | POA: Insufficient documentation

## 2021-01-22 DIAGNOSIS — E785 Hyperlipidemia, unspecified: Secondary | ICD-10-CM | POA: Insufficient documentation

## 2021-01-22 DIAGNOSIS — I700 Atherosclerosis of aorta: Secondary | ICD-10-CM | POA: Diagnosis not present

## 2021-01-22 DIAGNOSIS — Z0000 Encounter for general adult medical examination without abnormal findings: Secondary | ICD-10-CM | POA: Diagnosis not present

## 2021-01-22 LAB — URINALYSIS, ROUTINE W REFLEX MICROSCOPIC
Bilirubin, UA: NEGATIVE
Glucose, UA: NEGATIVE
Ketones, UA: NEGATIVE
Leukocytes,UA: NEGATIVE
Nitrite, UA: NEGATIVE
Protein,UA: NEGATIVE
RBC, UA: NEGATIVE
Specific Gravity, UA: >1.03 — ABNORMAL HIGH (ref 1.005–1.030)
Urobilinogen, Ur: 1 mg/dL (ref 0.2–1.0)
pH, UA: 5.5 (ref 5.0–7.5)

## 2021-01-22 MED ORDER — BUSPIRONE HCL 10 MG PO TABS: ORAL_TABLET | ORAL | 3 refills | Status: DC

## 2021-01-22 MED ORDER — TRAZODONE HCL 300 MG PO TABS: ORAL_TABLET | ORAL | 3 refills | Status: DC

## 2021-01-22 MED ORDER — BUPROPION HCL ER (XL) 300 MG PO TB24: ORAL_TABLET | ORAL | 3 refills | Status: DC

## 2021-01-22 MED ORDER — ATORVASTATIN CALCIUM 20 MG PO TABS: 20.0000 mg | ORAL_TABLET | Freq: Every day | ORAL | 3 refills | Status: DC

## 2021-01-22 NOTE — Progress Notes (Signed)
BP 118/86    Pulse 86    Temp 98.3 F (36.8 C) (Oral)    Ht 5' 5.35" (1.66 m)    Wt 181 lb 3.2 oz (82.2 kg)    LMP  (LMP Unknown)    SpO2 98%    BMI 29.83 kg/m    Subjective:    Patient ID: Chelsey [name], female    DOB: 13-Sep-1950, 70 y.o.   MRN: [id number]  Chief Complaint  Patient presents with   Annual Exam    HPI: Chelsey [name] is a 70 y.o. female  Depression        This is a chronic problem.  The current episode started 1 to 4 weeks ago.   Associated symptoms include sad.  Associated symptoms include no decreased concentration, no fatigue, no helplessness, no hopelessness, does not have insomnia, not irritable, no restlessness, no decreased interest, no myalgias, no headaches, no indigestion and no suicidal ideas.( Insomnia - is on trazadone )  Past medical history includes anxiety.   Anxiety Presents for follow-up visit. Patient reports no chest pain, compulsions, confusion, decreased concentration, feeling of choking, insomnia, irritability, malaise, muscle tension, nausea, restlessness or suicidal ideas.    Hyperlipidemia This is a chronic problem. The problem is uncontrolled. Pertinent negatives include no chest pain or myalgias.   Chief Complaint  Patient presents with   Annual Exam    Relevant past medical, surgical, family and social history reviewed and updated as indicated. Interim medical history since our last visit reviewed. Allergies and medications reviewed and updated.  Review of Systems  Constitutional:  Negative for fatigue and irritability.  Cardiovascular:  Negative for chest pain.  Gastrointestinal:  Negative for nausea.  Musculoskeletal:  Negative for myalgias.  Neurological:  Negative for headaches.  Psychiatric/Behavioral:  Positive for depression. Negative for confusion, decreased concentration and suicidal ideas. The patient does not have insomnia.    Per HPI unless specifically indicated above     Objective:    BP 118/86    Pulse  86    Temp 98.3 F (36.8 C) (Oral)    Ht 5' 5.35" (1.66 m)    Wt 181 lb 3.2 oz (82.2 kg)    LMP  (LMP Unknown)    SpO2 98%    BMI 29.83 kg/m   Wt Readings from Last 3 Encounters:  01/22/21 181 lb 3.2 oz (82.2 kg)  06/24/20 177 lb (80.3 kg)  01/21/20 168 lb 6 oz (76.4 kg)    Physical Exam Constitutional:      General: She is not irritable.   Results for orders placed or performed in visit on 09/25/20  Novel Coronavirus, NAA ([facility])   Specimen: Nasopharyngeal(NP) swabs in vial transport medium  Result Value Ref Range   SARS-CoV-2, NAA Detected (A) Not Detected  SARS-COV-2, NAA 2 DAY TAT  Result Value Ref Range   SARS-CoV-2, NAA 2 DAY TAT Performed         Current Outpatient Medications:    buPROPion (WELLBUTRIN XL) 300 MG 24 hr tablet, TAKE 1 TABLET(300 MG) BY MOUTH DAILY, Disp: 90 tablet, Rfl: 0   busPIRone (BUSPAR) 10 MG tablet, TAKE 1 TABLET(10 MG) BY MOUTH THREE TIMES DAILY, Disp: 270 tablet, Rfl: 0   docusate sodium (COLACE) 100 MG capsule, Take 1 capsule (100 mg total) by mouth 2 (two) times daily., Disp: 90 capsule, Rfl: 1   pantoprazole (PROTONIX) 40 MG tablet, TAKE 1 TABLET(40 MG) BY MOUTH DAILY, Disp: 90 tablet, Rfl: 1  polyethylene glycol (MIRALAX / GLYCOLAX) 17 g packet, Take 17 g by mouth daily., Disp: 30 each, Rfl: 1   trazodone (DESYREL) 300 MG tablet, TAKE 1 TABLET(300 MG) BY MOUTH AT BEDTIME, Disp: 90 tablet, Rfl: 0   OLANZapine (ZYPREXA) 5 MG tablet, Take 1 tablet (5 mg total) by mouth at bedtime. (Patient not taking: Reported on 01/22/2021), Disp: 90 tablet, Rfl: 1    Assessment & Plan:  Depression was seeing psych @ was referred to psych s/p olanzapine unsure ? / what diagnosis is [place] without such.  Will be referred to psych for such  Was done in 2021 no follow through  2. HLD  recheck FLP, check LFT's work on diet, SE of meds explained to pt. low fat and high fiber diet explained to pt.  Latest Reference Range & Units 06/24/20 11:54   Cholesterol, Total 100 - 199 mg/dL 245 (H)  HDL Cholesterol >39 mg/dL 52  Triglycerides 0 - 149 mg/dL 153 (H)  VLDL Cholesterol Cal 5 - 40 mg/dL 28  LDL Chol Calc (NIH) 0 - 99 mg/dL 165 (H)  (H): Data is abnormally high   Arthritis  Stable chronic.  Consider ortho referral.   Physical wnl PHYSICAL :  Physical Wnl will check CMP, FLP, CBC,TSH  Problem List Items Addressed This Visit   None    Orders Placed This Encounter  Procedures   Flu Vaccine QUAD High Dose(Fluad)   CBC with Differential/Platelet   Comprehensive metabolic panel   Lipid panel   TSH   Urinalysis, Routine w reflex microscopic   Ambulatory referral to Psychiatry     Meds ordered this encounter  Medications   buPROPion (WELLBUTRIN XL) 300 MG 24 hr tablet    Sig: TAKE 1 TABLET(300 MG) BY MOUTH DAILY    Dispense:  30 tablet    Refill:  3    ZERO refills remain on this prescription. Your patient is requesting advance approval of refills for this medication to PREVENT ANY MISSED DOSES   busPIRone (BUSPAR) 10 MG tablet    Sig: TAKE 1 TABLET(10 MG) BY MOUTH THREE TIMES DAILY    Dispense:  30 tablet    Refill:  3   trazodone (DESYREL) 300 MG tablet    Sig: TAKE 1 TABLET(300 MG) BY MOUTH AT BEDTIME    Dispense:  30 tablet    Refill:  3    ZERO refills remain on this prescription. Your patient is requesting advance approval of refills for this medication to PREVENT ANY MISSED DOSES   atorvastatin (LIPITOR) 20 MG tablet    Sig: Take 1 tablet (20 mg total) by mouth daily.    Dispense:  90 tablet    Refill:  3     Follow up plan: No follow-ups on file.

## 2021-01-23 LAB — CBC WITH DIFFERENTIAL/PLATELET
Basophils Absolute: 0 10*3/uL (ref 0.0–0.2)
Basos: 1 %
EOS (ABSOLUTE): 0.1 10*3/uL (ref 0.0–0.4)
Eos: 3 %
Hematocrit: 41.4 % (ref 34.0–46.6)
Hemoglobin: 13.7 g/dL (ref 11.1–15.9)
Immature Grans (Abs): 0 10*3/uL (ref 0.0–0.1)
Immature Granulocytes: 0 %
Lymphocytes Absolute: 1.1 10*3/uL (ref 0.7–3.1)
Lymphs: 21 %
MCH: 30.5 pg (ref 26.6–33.0)
MCHC: 33.1 g/dL (ref 31.5–35.7)
MCV: 92 fL (ref 79–97)
Monocytes Absolute: 0.5 10*3/uL (ref 0.1–0.9)
Monocytes: 9 %
Neutrophils Absolute: 3.6 10*3/uL (ref 1.4–7.0)
Neutrophils: 66 %
Platelets: 241 10*3/uL (ref 150–450)
RBC: 4.49 x10E6/uL (ref 3.77–5.28)
RDW: 13.2 % (ref 11.7–15.4)
WBC: 5.3 10*3/uL (ref 3.4–10.8)

## 2021-01-23 LAB — COMPREHENSIVE METABOLIC PANEL
ALT: 10 IU/L (ref 0–32)
AST: 16 IU/L (ref 0–40)
Albumin/Globulin Ratio: 1.6 (ref 1.2–2.2)
Albumin: 3.9 g/dL (ref 3.8–4.8)
Alkaline Phosphatase: 113 IU/L (ref 44–121)
BUN/Creatinine Ratio: 11 — ABNORMAL LOW (ref 12–28)
BUN: 9 mg/dL (ref 8–27)
Bilirubin Total: 0.3 mg/dL (ref 0.0–1.2)
CO2: 26 mmol/L (ref 20–29)
Calcium: 9 mg/dL (ref 8.7–10.3)
Chloride: 101 mmol/L (ref 96–106)
Creatinine, Ser: 0.84 mg/dL (ref 0.57–1.00)
Globulin, Total: 2.4 g/dL (ref 1.5–4.5)
Glucose: 104 mg/dL — ABNORMAL HIGH (ref 70–99)
Potassium: 4.7 mmol/L (ref 3.5–5.2)
Sodium: 139 mmol/L (ref 134–144)
Total Protein: 6.3 g/dL (ref 6.0–8.5)
eGFR: 75 mL/min/{1.73_m2} (ref >59)

## 2021-01-23 LAB — TSH: TSH: 3.63 u[IU]/mL (ref 0.450–4.500)

## 2021-01-27 ENCOUNTER — Other Ambulatory Visit: Payer: Self-pay | Admitting: Internal Medicine

## 2021-01-28 NOTE — Telephone Encounter (Signed)
Requested Prescriptions  Pending Prescriptions Disp Refills   pantoprazole (PROTONIX) 40 MG tablet [Pharmacy Med Name: PANTOPRAZOLE 40MG  TABLETS] 90 tablet 3    Sig: TAKE 1 TABLET(40 MG) BY MOUTH DAILY     Gastroenterology: Proton Pump Inhibitors Passed - 01/27/2021 11:00 AM      Passed - Valid encounter within last 12 months    Recent Outpatient Visits          6 days ago Annual physical exam   [facility] Vigg, Avanti, MD   4 months ago Upper respiratory tract infection, unspecified type   [place], Lauren A, NP   7 months ago Aortic atherosclerosis ([place])   [place], Lauren A, NP   1 year ago Major depressive disorder, recurrent severe without psychotic features ([place])   [place], Barbaraann Faster, NP   1 year ago Primary insomnia   [place], Lilia Argue, [place]      Future Appointments            In 5 months Vigg, Avanti, MD [facility], [facility]

## 2021-02-16 ENCOUNTER — Ambulatory Visit: Payer: Self-pay

## 2021-02-16 NOTE — Telephone Encounter (Signed)
Pt called saying she has a headache, diarrhea, cough and wants to know if she can come by the office and get a covid test.  She does not have any home test.    Chief Complaint: Cough, chills, diarrhea, headache Symptoms: Has not had a COVID 19 test. Frequency: Started yesterday Pertinent Negatives: Patient denies shortness of breath. Disposition: [] ED /[] Urgent Care (no appt availability in office) / [x] Appointment(In office/virtual)/ []  [place] [facility]/ [] Home Care/ [] Refused Recommended Disposition /[] [place] [facility]/ []  Follow-up with PCP Additional Notes:     Reason for Disposition  [1] Fever returns after gone for over 24 hours AND [2] symptoms worse or not improved  Answer Assessment - Initial Assessment Questions 1. ONSET: "When did the cough begin?"      Yesterday 2. SEVERITY: "How bad is the cough today?"      Mild 3. SPUTUM: "Describe the color of your sputum" (none, dry cough; clear, white, yellow, green)     None 4. HEMOPTYSIS: "Are you coughing up any blood?" If so ask: "How much?" (flecks, streaks, tablespoons, etc.)     No 5. DIFFICULTY BREATHING: "Are you having difficulty breathing?" If Yes, ask: "How bad is it?" (e.g., mild, moderate, severe)    - MILD: No SOB at rest, mild SOB with walking, speaks normally in sentences, can lie down, no retractions, pulse < 100.    - MODERATE: SOB at rest, SOB with minimal exertion and prefers to sit, cannot lie down flat, speaks in phrases, mild retractions, audible wheezing, pulse 100-120.    - SEVERE: Very SOB at rest, speaks in single words, struggling to breathe, sitting hunched forward, retractions, pulse > 120      No 6. FEVER: "Do you have a fever?" If Yes, ask: "What is your temperature, how was it measured, and when did it start?"     Chills 7. CARDIAC HISTORY: "Do you have any history of heart disease?" (e.g., heart attack, congestive heart failure)      No 8. LUNG HISTORY: "Do you have any history of lung  disease?"  (e.g., pulmonary embolus, asthma, emphysema)     No 9. PE RISK FACTORS: "Do you have a history of blood clots?" (or: recent major surgery, recent prolonged travel, bedridden)     No 10. OTHER SYMPTOMS: "Do you have any other symptoms?" (e.g., runny nose, wheezing, chest pain)       Diarrhea, achy 11. PREGNANCY: "Is there any chance you are pregnant?" "When was your last menstrual period?"       No 12. TRAVEL: "Have you traveled out of the country in the last month?" (e.g., travel history, exposures)       No  Protocols used: Cough - Acute Productive-A-AH

## 2021-02-17 ENCOUNTER — Other Ambulatory Visit: Payer: Self-pay

## 2021-02-17 ENCOUNTER — Encounter: Payer: Self-pay | Admitting: Nurse Practitioner

## 2021-02-17 ENCOUNTER — Ambulatory Visit (INDEPENDENT_AMBULATORY_CARE_PROVIDER_SITE_OTHER): Payer: Medicare Other | Admitting: Nurse Practitioner

## 2021-02-17 VITALS — BP 110/77 | HR 94 | Temp 98.4°F

## 2021-02-17 DIAGNOSIS — R6889 Other general symptoms and signs: Secondary | ICD-10-CM | POA: Insufficient documentation

## 2021-02-17 DIAGNOSIS — Z760 Encounter for issue of repeat prescription: Secondary | ICD-10-CM

## 2021-02-17 LAB — VERITOR FLU A/B WAIVED
Influenza A: NEGATIVE
Influenza B: NEGATIVE

## 2021-02-17 MED ORDER — TRAZODONE HCL 300 MG PO TABS: ORAL_TABLET | ORAL | 4 refills | Status: DC

## 2021-02-17 MED ORDER — PREDNISONE 20 MG PO TABS: 40.0000 mg | ORAL_TABLET | Freq: Every day | ORAL | 0 refills | Status: AC

## 2021-02-17 NOTE — Progress Notes (Signed)
BP 110/77    Pulse 94    Temp 98.4 F (36.9 C) (Oral)    LMP  (LMP Unknown)    SpO2 97%    Subjective:    Patient ID: Chelsey [name], female    DOB: [birth year], 71 y.o.   MRN: [id number]  HPI: Chelsey [name] is a 71 y.o. female  Chief Complaint  Patient presents with   Cough    Patient states she became symptomatic Sunday. Patient states she is feeling a little better and states she is just paranoid as she tested positive for COVID back in September. Patient states she has tried over the counter medication for the headache, but nothing else.    Generalized Body Aches   Headache   Fever    Patient states she noticed she would get hot then cold, but doesn't know if she had a fever as she was unable to check it.    Chills   Medication Refill    Patient states she is requesting a refill on her Trazodone.    Needs refills on Trazodone today.  UPPER RESPIRATORY TRACT INFECTION Started with symptoms on Sunday -- started with headache and aches all over + coughing.  Has history of Covid back in September 2022 and was treated with Molnupiravir.  Has had flu vaccine and 2 Covid vaccinations.  Is a current smoker, smokes two cigarettes a day.  Has smoked for > 30 years.   Worst symptom: chills Fever:  chills -- not been checking temp Cough: yes Shortness of breath: yes Wheezing: no Chest pain: no Chest tightness: no Chest congestion: no Nasal congestion: yes Runny nose: yes Post nasal drip: yes Sneezing: yes Sore throat: no Swollen glands: none Sinus pressure: no Headache: yes Face pain: no Toothache: no Ear pain: none Ear pressure: none Eyes red/itching:no Eye drainage/crusting: no  Vomiting: no Rash: no Fatigue: yes Sick contacts: no Strep contacts: no  Context:  a little bit better Recurrent sinusitis: no Relief with OTC cold/cough medications: yes  Treatments attempted:  Tylenol     Relevant past medical, surgical, family and social history reviewed and  updated as indicated. Interim medical history since our last visit reviewed. Allergies and medications reviewed and updated.  Review of Systems  Constitutional:  Positive for chills and fatigue. Negative for activity change, appetite change and fever.  HENT:  Positive for postnasal drip and rhinorrhea. Negative for congestion, ear discharge, ear pain, facial swelling, sinus pressure, sinus pain, sneezing, sore throat and voice change.   Eyes:  Negative for pain and visual disturbance.  Respiratory:  Positive for cough and shortness of breath. Negative for chest tightness and wheezing.   Cardiovascular:  Negative for chest pain, palpitations and leg swelling.  Gastrointestinal: Negative.   Endocrine: Negative.   Musculoskeletal:  Positive for myalgias.  Neurological:  Positive for headaches. Negative for dizziness and numbness.  Psychiatric/Behavioral: Negative.     Per HPI unless specifically indicated above     Objective:    BP 110/77    Pulse 94    Temp 98.4 F (36.9 C) (Oral)    LMP  (LMP Unknown)    SpO2 97%   Wt Readings from Last 3 Encounters:  01/22/21 181 lb 3.2 oz (82.2 kg)  06/24/20 177 lb (80.3 kg)  01/21/20 168 lb 6 oz (76.4 kg)    Physical Exam Vitals and nursing note reviewed.  Constitutional:      General: She is awake. She is not in acute  distress.    Appearance: She is well-developed and well-groomed. She is not ill-appearing or toxic-appearing.  HENT:     Head: Normocephalic.     Right Ear: Hearing, tympanic membrane, ear canal and external ear normal.     Left Ear: Hearing, tympanic membrane, ear canal and external ear normal.     Nose: Rhinorrhea present. Rhinorrhea is clear.     Right Sinus: No maxillary sinus tenderness or frontal sinus tenderness.     Left Sinus: No maxillary sinus tenderness or frontal sinus tenderness.     Mouth/Throat:     Mouth: Mucous membranes are moist.     Pharynx: Posterior oropharyngeal erythema (mild with cobblestone  appearance) present. No pharyngeal swelling or oropharyngeal exudate.     Tonsils: 0 on the right. 0 on the left.  Eyes:     General: Lids are normal.        Right eye: No discharge.        Left eye: No discharge.     Conjunctiva/sclera: Conjunctivae normal.     Pupils: Pupils are equal, round, and reactive to light.  Cardiovascular:     Rate and Rhythm: Normal rate and regular rhythm.     Heart sounds: Normal heart sounds. No murmur heard.   No gallop.  Pulmonary:     Effort: Pulmonary effort is normal. No accessory muscle usage or respiratory distress.     Breath sounds: Normal breath sounds.  Abdominal:     General: Bowel sounds are normal.     Palpations: Abdomen is soft. There is no hepatomegaly or splenomegaly.  Musculoskeletal:     Cervical back: Normal range of motion and neck supple.     Right lower leg: No edema.     Left lower leg: No edema.  Skin:    General: Skin is warm and dry.  Neurological:     Mental Status: She is alert and oriented to person, place, and time.  Psychiatric:        Attention and Perception: Attention normal.        Mood and Affect: Mood normal.        Speech: Speech normal.        Behavior: Behavior normal. Behavior is cooperative.        Thought Content: Thought content normal.   Results for orders placed or performed in visit on 01/22/21  CBC with Differential/Platelet  Result Value Ref Range   WBC 5.3 3.4 - 10.8 x10E3/uL   RBC 4.49 3.77 - 5.28 x10E6/uL   Hemoglobin 13.7 11.1 - 15.9 g/dL   Hematocrit 41.4 34.0 - 46.6 %   MCV 92 79 - 97 fL   MCH 30.5 26.6 - 33.0 pg   MCHC 33.1 31.5 - 35.7 g/dL   RDW 13.2 11.7 - 15.4 %   Platelets 241 150 - 450 x10E3/uL   Neutrophils 66 Not Estab. %   Lymphs 21 Not Estab. %   Monocytes 9 Not Estab. %   Eos 3 Not Estab. %   Basos 1 Not Estab. %   Neutrophils Absolute 3.6 1.4 - 7.0 x10E3/uL   Lymphocytes Absolute 1.1 0.7 - 3.1 x10E3/uL   Monocytes Absolute 0.5 0.1 - 0.9 x10E3/uL   EOS (ABSOLUTE)  0.1 0.0 - 0.4 x10E3/uL   Basophils Absolute 0.0 0.0 - 0.2 x10E3/uL   Immature Granulocytes 0 Not Estab. %   Immature Grans (Abs) 0.0 0.0 - 0.1 x10E3/uL  Comprehensive metabolic panel  Result Value Ref Range   Glucose  104 (H) 70 - 99 mg/dL   BUN 9 8 - 27 mg/dL   Creatinine, Ser 0.84 0.57 - 1.00 mg/dL   eGFR 75 >59 mL/min/1.73   BUN/Creatinine Ratio 11 (L) 12 - 28   Sodium 139 134 - 144 mmol/L   Potassium 4.7 3.5 - 5.2 mmol/L   Chloride 101 96 - 106 mmol/L   CO2 26 20 - 29 mmol/L   Calcium 9.0 8.7 - 10.3 mg/dL   Total Protein 6.3 6.0 - 8.5 g/dL   Albumin 3.9 3.8 - 4.8 g/dL   Globulin, Total 2.4 1.5 - 4.5 g/dL   Albumin/Globulin Ratio 1.6 1.2 - 2.2   Bilirubin Total 0.3 0.0 - 1.2 mg/dL   Alkaline Phosphatase 113 44 - 121 IU/L   AST 16 0 - 40 IU/L   ALT 10 0 - 32 IU/L  TSH  Result Value Ref Range   TSH 3.630 0.450 - 4.500 uIU/mL  Urinalysis, Routine w reflex microscopic  Result Value Ref Range   Specific Gravity, UA >1.030 (H) 1.005 - 1.030   pH, UA 5.5 5.0 - 7.5   Color, UA Yellow Yellow   Appearance Ur Cloudy (A) Clear   Leukocytes,UA Negative Negative   Protein,UA Negative Negative/Trace   Glucose, UA Negative Negative   Ketones, UA Negative Negative   RBC, UA Negative Negative   Bilirubin, UA Negative Negative   Urobilinogen, Ur 1.0 0.2 - 1.0 mg/dL   Nitrite, UA Negative Negative      Assessment & Plan:   Problem List Items Addressed This Visit       Other   Flu-like symptoms - Primary    Acute for 4 days -- she is up to date on vaccinations.  Will perform flu and Covid testing today -- flu returned negative.  Recommend she self quarantine until tests return and symptoms improved.  At this time suspect more viral, possibly exacerbation as is smoker and suspect some underlying lung disease.  Will send in Prednisone 40 MG daily x 5 days.  Recommend: - Increased rest - Increasing Fluids - Acetaminophen as needed for fever/pain.  - Salt water gargling, chloraseptic  spray and throat lozenges - Mucinex.  Return to office as needed for worsening or ongoing symptoms.      Relevant Orders   Novel Coronavirus, NAA ([facility])   Influenza A & B ([facility])   Other Visit Diagnoses     Medication refill       Refill on Trazodone sent per request.        Follow up plan: Return if symptoms worsen or fail to improve.

## 2021-02-17 NOTE — Assessment & Plan Note (Signed)
Acute for 4 days -- she is up to date on vaccinations.  Will perform flu and Covid testing today -- flu returned negative.  Recommend she self quarantine until tests return and symptoms improved.  At this time suspect more viral, possibly exacerbation as is smoker and suspect some underlying lung disease.  Will send in Prednisone 40 MG daily x 5 days.  Recommend: - Increased rest - Increasing Fluids - Acetaminophen as needed for fever/pain.  - Salt water gargling, chloraseptic spray and throat lozenges - Mucinex.  Return to office as needed for worsening or ongoing symptoms.

## 2021-02-17 NOTE — Patient Instructions (Signed)

## 2021-02-18 LAB — SARS-COV-2, NAA 2 DAY TAT

## 2021-02-18 LAB — NOVEL CORONAVIRUS, NAA: SARS-CoV-2, NAA: NOT DETECTED

## 2021-02-18 NOTE — Progress Notes (Signed)
Good morning, please let patient know that Covid testing is negative.

## 2021-03-16 ENCOUNTER — Other Ambulatory Visit: Payer: Self-pay | Admitting: Internal Medicine

## 2021-03-17 NOTE — Telephone Encounter (Signed)
Requested Prescriptions  Pending Prescriptions Disp Refills   busPIRone (BUSPAR) 10 MG tablet [Pharmacy Med Name: BUSPIRONE 10MG  TABLETS] 270 tablet 0    Sig: TAKE 1 TABLET(10 MG) BY MOUTH THREE TIMES DAILY     Psychiatry: Anxiolytics/Hypnotics - Non-controlled Passed - 03/16/2021  8:02 AM      Passed - Valid encounter within last 12 months    Recent Outpatient Visits          4 weeks ago Flu-like symptoms   [place], Henrine Screws T, NP   1 month ago Annual physical exam   [facility] Vigg, Avanti, MD   5 months ago Upper respiratory tract infection, unspecified type   [place], Lauren A, NP   8 months ago Aortic atherosclerosis ([place])   [place], Lauren A, NP   1 year ago Major depressive disorder, recurrent severe without psychotic features ([place])   [place], Barbaraann Faster, NP      Future Appointments            In 4 months Vigg, Avanti, MD [facility], [facility]

## 2021-07-13 ENCOUNTER — Other Ambulatory Visit: Payer: Self-pay | Admitting: Internal Medicine

## 2021-07-13 NOTE — Telephone Encounter (Signed)
Requested Prescriptions  Pending Prescriptions Disp Refills  . buPROPion (WELLBUTRIN XL) 300 MG 24 hr tablet [Pharmacy Med Name: BUPROPION XL '300MG'$  TABLETS] 90 tablet 0    Sig: TAKE 1 TABLET(300 MG) BY MOUTH DAILY     Psychiatry: Antidepressants - bupropion Passed - 07/13/2021 10:23 AM      Passed - Cr in normal range and within 360 days    Creatinine  Date Value Ref Range Status  05/31/2013 0.77 0.60 - 1.30 mg/dL Final   Creatinine, Ser  Date Value Ref Range Status  01/22/2021 0.84 0.57 - 1.00 mg/dL Final         Passed - AST in normal range and within 360 days    AST  Date Value Ref Range Status  01/22/2021 16 0 - 40 IU/L Final   SGOT(AST)  Date Value Ref Range Status  05/31/2013 23 15 - 37 Unit/L Final         Passed - ALT in normal range and within 360 days    ALT  Date Value Ref Range Status  01/22/2021 10 0 - 32 IU/L Final   SGPT (ALT)  Date Value Ref Range Status  05/31/2013 18 12 - 78 U/L Final         Passed - Completed PHQ-2 or PHQ-9 in the last 360 days      Passed - Last BP in normal range    BP Readings from Last 1 Encounters:  02/17/21 110/77         Passed - Valid encounter within last 6 months    Recent Outpatient Visits          4 months ago Flu-like symptoms   [place], Barbaraann Faster, NP   5 months ago Annual physical exam   [facility] Vigg, Avanti, MD   9 months ago Upper respiratory tract infection, unspecified type   [place], Lauren A, NP   1 year ago Aortic atherosclerosis ([place])   [place], Lauren A, NP   1 year ago Major depressive disorder, recurrent severe without psychotic features ([place])   [place], Barbaraann Faster, NP      Future Appointments            In 1 week Vigg, Avanti, MD [facility], [facility]

## 2021-07-23 ENCOUNTER — Ambulatory Visit (INDEPENDENT_AMBULATORY_CARE_PROVIDER_SITE_OTHER): Payer: Medicare Other | Admitting: Internal Medicine

## 2021-07-23 ENCOUNTER — Telehealth: Payer: Self-pay

## 2021-07-23 ENCOUNTER — Encounter: Payer: Self-pay | Admitting: Internal Medicine

## 2021-07-23 VITALS — BP 119/82 | HR 80 | Temp 97.9°F | Ht 65.35 in | Wt 186.0 lb

## 2021-07-23 DIAGNOSIS — F17210 Nicotine dependence, cigarettes, uncomplicated: Secondary | ICD-10-CM | POA: Diagnosis not present

## 2021-07-23 DIAGNOSIS — E785 Hyperlipidemia, unspecified: Secondary | ICD-10-CM

## 2021-07-23 DIAGNOSIS — F339 Major depressive disorder, recurrent, unspecified: Secondary | ICD-10-CM | POA: Diagnosis not present

## 2021-07-23 MED ORDER — BUPROPION HCL ER (XL) 300 MG PO TB24: ORAL_TABLET | ORAL | 3 refills | Status: DC

## 2021-07-23 MED ORDER — ATORVASTATIN CALCIUM 20 MG PO TABS: 20.0000 mg | ORAL_TABLET | Freq: Every day | ORAL | 3 refills | Status: DC

## 2021-07-23 MED ORDER — PANTOPRAZOLE SODIUM 40 MG PO TBEC: 40.0000 mg | DELAYED_RELEASE_TABLET | Freq: Every day | ORAL | 3 refills | Status: DC

## 2021-07-23 MED ORDER — BUSPIRONE HCL 10 MG PO TABS: 10.0000 mg | ORAL_TABLET | Freq: Two times a day (BID) | ORAL | 3 refills | Status: DC

## 2021-07-23 NOTE — Telephone Encounter (Signed)
Copied from [place] [id number]. Topic: General - Other >> Jul 23, 2021 11:52 AM Rudene Anda wrote: Reason for CRM: Pharmacist called in stating Rx was sent in with 2 sets of directions, caller needed to know which one was correct for busPIRone (BUSPAR) 10 MG tablet, please advise.   Routing to provider. Please correct and resend to the pharmacy.

## 2021-07-23 NOTE — Progress Notes (Signed)
BP 119/82   Pulse 80   Temp 97.9 F (36.6 C) (Oral)   Ht 5' 5.35" (1.66 m)   Wt 186 lb (84.4 kg)   LMP  (LMP Unknown)   SpO2 98%   BMI 30.62 kg/m    Subjective:    Patient ID: Chelsey [name], female    DOB: 10/18/1950, 71 y.o.   MRN: [id number]  Chief Complaint  Patient presents with  . Hyperlipidemia  . Depression  . Anxiety    HPI: Chelsey [name] is a 71 y.o. female  Hyperlipidemia This is a chronic problem. The current episode started more than 1 year ago. The problem is controlled. Pertinent negatives include no chest pain.  Depression        This is a chronic problem.  The current episode started more than 1 year ago.   Associated symptoms include no decreased concentration and no restlessness.  Past medical history includes anxiety.   Anxiety Presents for follow-up visit. Patient reports no chest pain, compulsions, confusion, decreased concentration, depressed mood, dizziness, dry mouth, excessive worry, feeling of choking, irritability, malaise, muscle tension, nausea, nervous/anxious behavior, obsessions, palpitations, panic or restlessness.     Chief Complaint  Patient presents with  . Hyperlipidemia  . Depression  . Anxiety    Relevant past medical, surgical, family and social history reviewed and updated as indicated. Interim medical history since our last visit reviewed. Allergies and medications reviewed and updated.  Review of Systems  Constitutional:  Negative for irritability.  Cardiovascular:  Negative for chest pain and palpitations.  Gastrointestinal:  Negative for nausea.  Neurological:  Negative for dizziness.  Psychiatric/Behavioral:  Positive for depression. Negative for confusion and decreased concentration. The patient is not nervous/anxious.     Per HPI unless specifically indicated above     Objective:    BP 119/82   Pulse 80   Temp 97.9 F (36.6 C) (Oral)   Ht 5' 5.35" (1.66 m)   Wt 186 lb (84.4 kg)   LMP  (LMP Unknown)    SpO2 98%   BMI 30.62 kg/m   Wt Readings from Last 3 Encounters:  07/23/21 186 lb (84.4 kg)  01/22/21 181 lb 3.2 oz (82.2 kg)  06/24/20 177 lb (80.3 kg)    Physical Exam  Results for orders placed or performed in visit on 02/17/21  Novel Coronavirus, NAA ([facility])   Specimen: Nasopharyngeal(NP) swabs in vial transport medium  Result Value Ref Range   SARS-CoV-2, NAA Not Detected Not Detected  SARS-COV-2, NAA 2 DAY TAT  Result Value Ref Range   SARS-CoV-2, NAA 2 DAY TAT Performed   Influenza A & B (STAT)  Result Value Ref Range   Influenza A Negative Negative   Influenza B Negative Negative        Current Outpatient Medications:  .  atorvastatin (LIPITOR) 20 MG tablet, Take 1 tablet (20 mg total) by mouth daily., Disp: 90 tablet, Rfl: 3 .  buPROPion (WELLBUTRIN XL) 300 MG 24 hr tablet, TAKE 1 TABLET(300 MG) BY MOUTH DAILY, Disp: 90 tablet, Rfl: 0 .  busPIRone (BUSPAR) 10 MG tablet, TAKE 1 TABLET(10 MG) BY MOUTH THREE TIMES DAILY (Patient taking differently: 10 mg 2 (two) times daily. TAKE 1 TABLET(10 MG) BY MOUTH THREE TIMES DAILY), Disp: 270 tablet, Rfl: 0 .  docusate sodium (COLACE) 100 MG capsule, Take 1 capsule (100 mg total) by mouth 2 (two) times daily., Disp: 90 capsule, Rfl: 1 .  pantoprazole (PROTONIX) 40 MG tablet, TAKE  1 TABLET(40 MG) BY MOUTH DAILY, Disp: 90 tablet, Rfl: 3 .  polyethylene glycol (MIRALAX / GLYCOLAX) 17 g packet, Take 17 g by mouth daily., Disp: 30 each, Rfl: 1 .  trazodone (DESYREL) 300 MG tablet, TAKE 1 TABLET(300 MG) BY MOUTH AT BEDTIME, Disp: 90 tablet, Rfl: 4    Assessment & Plan:     Problem List Items Addressed This Visit   None    No orders of the defined types were placed in this encounter.    No orders of the defined types were placed in this encounter.    Follow up plan: No follow-ups on file.

## 2021-07-23 NOTE — Telephone Encounter (Signed)
[facility] was called and I spoke with [place] a Customer service manager and informed him that patient should be taking her Buspirone 10 mg BID and not TID, [place] verbalized understanding.

## 2021-07-23 NOTE — Patient Instructions (Signed)
Kegel Exercises  Kegel exercises can help strengthen your pelvic floor muscles. The pelvic floor is a group of muscles that support your rectum, small intestine, and bladder. In females, pelvic floor muscles also help support the uterus. These muscles help you control the flow of urine and stool (feces). Kegel exercises are painless and simple. They do not require any equipment. Your provider may suggest Kegel exercises to: Improve bladder and bowel control. Improve sexual response. Improve weak pelvic floor muscles after surgery to remove the uterus (hysterectomy) or after pregnancy, in females. Improve weak pelvic floor muscles after prostate gland removal or surgery, in males. Kegel exercises involve squeezing your pelvic floor muscles. These are the same muscles you squeeze when you try to stop the flow of urine or keep from passing gas. The exercises can be done while sitting, standing, or lying down, but it is best to vary your position. Ask your health care provider which exercises are safe for you. Do exercises exactly as told by your health care provider and adjust them as directed. Do not begin these exercises until told by your health care provider. Exercises How to do Kegel exercises: Squeeze your pelvic floor muscles tight. You should feel a tight lift in your rectal area. If you are a female, you should also feel a tightness in your vaginal area. Keep your stomach, buttocks, and legs relaxed. Hold the muscles tight for up to 10 seconds. Breathe normally. Relax your muscles for up to 10 seconds. Repeat as told by your health care provider. Repeat this exercise daily as told by your health care provider. Continue to do this exercise for at least 4-6 weeks, or for as long as told by your health care provider. You may be referred to a physical therapist who can help you learn more about how to do Kegel exercises. Depending on your condition, your health care provider may  recommend: Varying how long you squeeze your muscles. Doing several sets of exercises every day. Doing exercises for several weeks. Making Kegel exercises a part of your regular exercise routine. This information is not intended to replace advice given to you by your health care provider. Make sure you discuss any questions you have with your health care provider. Document Revised: 05/22/2020 Document Reviewed: 05/22/2020 Elsevier Patient Education  2023 [employer].  

## 2021-07-23 NOTE — Telephone Encounter (Signed)
She takes it bid so had to be chanegd

## 2021-09-24 ENCOUNTER — Telehealth: Payer: Self-pay

## 2021-09-24 DIAGNOSIS — Z1231 Encounter for screening mammogram for malignant neoplasm of breast: Secondary | ICD-10-CM

## 2021-09-24 NOTE — Telephone Encounter (Signed)
Spoke with patient about Mammogram will call and schedule for patient

## 2021-09-24 NOTE — Telephone Encounter (Deleted)
Mammogram ordered and scheduled

## 2021-09-25 ENCOUNTER — Encounter: Payer: Self-pay | Admitting: Nurse Practitioner

## 2021-09-25 ENCOUNTER — Ambulatory Visit (INDEPENDENT_AMBULATORY_CARE_PROVIDER_SITE_OTHER): Payer: Medicare Other | Admitting: Nurse Practitioner

## 2021-09-25 DIAGNOSIS — B029 Zoster without complications: Secondary | ICD-10-CM | POA: Diagnosis not present

## 2021-09-25 MED ORDER — TRIAMCINOLONE ACETONIDE 0.1 % EX CREA: 1.0000 | TOPICAL_CREAM | Freq: Two times a day (BID) | CUTANEOUS | 0 refills | Status: DC

## 2021-09-25 MED ORDER — VALACYCLOVIR HCL 1 G PO TABS: 1000.0000 mg | ORAL_TABLET | Freq: Three times a day (TID) | ORAL | 0 refills | Status: AC

## 2021-09-25 NOTE — Progress Notes (Signed)
BP 134/89   Pulse 89   Temp 98.3 F (36.8 C) (Oral)   Ht 5' 5.35" (1.66 m)   Wt 190 lb 11.2 oz (86.5 kg)   LMP  (LMP Unknown)   SpO2 98%   BMI 31.39 kg/m    Subjective:    Patient ID: Chelsey [name], female    DOB: 1950-11-04, 71 y.o.   MRN: [id number]  HPI: Chelsey [name] is a 71 y.o. female  Chief Complaint  Patient presents with   Rash    Started this week, goes from shoulder to the fingers, no itch just painful   RASH Started sometime at beginning of week, no known exposures.  Splotchy rash to right arm. Has not been outside gardening or hiking.  Has not had similar in past. Duration:  days  Location: arms right arm only Itching: no Burning: yes Redness: yes Oozing: no Scaling: no Blisters: yes Painful: yes Fevers: no Change in detergents/soaps/personal care products: no Recent illness: no Recent travel:no History of same: no Context: worse Alleviating factors: nothing Treatments attempted:hydrocortisone cream Shortness of breath: no  Throat/tongue swelling: no Myalgias/arthralgias: no   Relevant past medical, surgical, family and social history reviewed and updated as indicated. Interim medical history since our last visit reviewed. Allergies and medications reviewed and updated.  Review of Systems  Constitutional:  Negative for activity change, appetite change, diaphoresis, fatigue and fever.  Respiratory:  Negative for cough, chest tightness and shortness of breath.   Cardiovascular:  Negative for chest pain, palpitations and leg swelling.  Gastrointestinal: Negative.   Skin:  Positive for rash.  Neurological: Negative.   Psychiatric/Behavioral: Negative.      Per HPI unless specifically indicated above     Objective:    BP 134/89   Pulse 89   Temp 98.3 F (36.8 C) (Oral)   Ht 5' 5.35" (1.66 m)   Wt 190 lb 11.2 oz (86.5 kg)   LMP  (LMP Unknown)   SpO2 98%   BMI 31.39 kg/m   Wt Readings from Last 3 Encounters:  09/25/21 190 lb  11.2 oz (86.5 kg)  07/23/21 186 lb (84.4 kg)  01/22/21 181 lb 3.2 oz (82.2 kg)    Physical Exam Vitals and nursing note reviewed.  Constitutional:      General: She is awake. She is not in acute distress.    Appearance: She is well-developed and well-groomed. She is obese. She is not ill-appearing or toxic-appearing.  HENT:     Head: Normocephalic.     Right Ear: Hearing normal.     Left Ear: Hearing normal.  Eyes:     General: Lids are normal.        Right eye: No discharge.        Left eye: No discharge.     Conjunctiva/sclera: Conjunctivae normal.     Pupils: Pupils are equal, round, and reactive to light.  Neck:     Thyroid: No thyromegaly.     Vascular: No carotid bruit.  Cardiovascular:     Rate and Rhythm: Normal rate and regular rhythm.     Heart sounds: Normal heart sounds. No murmur heard.    No gallop.  Pulmonary:     Effort: Pulmonary effort is normal. No accessory muscle usage or respiratory distress.     Breath sounds: Normal breath sounds.  Abdominal:     General: Bowel sounds are normal.     Palpations: Abdomen is soft. There is no hepatomegaly or splenomegaly.  Musculoskeletal:     Cervical back: Normal range of motion and neck supple.     Right lower leg: No edema.     Left lower leg: No edema.  Skin:    General: Skin is warm and dry.     Findings: Rash present. Rash is vesicular.     Comments: To entire right arm, extending from shoulder/axilla to right hand around thumb. Vesicular clusters with white vesicles and central erythema with mild swelling to some areas.  No tenderness.  Neurological:     Mental Status: She is alert and oriented to person, place, and time.  Psychiatric:        Attention and Perception: Attention normal.        Mood and Affect: Mood normal.        Speech: Speech normal.        Behavior: Behavior normal. Behavior is cooperative.        Thought Content: Thought content normal.     Results for orders placed or performed in  visit on 02/17/21  Novel Coronavirus, NAA ([facility])   Specimen: Nasopharyngeal(NP) swabs in vial transport medium  Result Value Ref Range   SARS-CoV-2, NAA Not Detected Not Detected  SARS-COV-2, NAA 2 DAY TAT  Result Value Ref Range   SARS-CoV-2, NAA 2 DAY TAT Performed   Influenza A & B (STAT)  Result Value Ref Range   Influenza A Negative Negative   Influenza B Negative Negative      Assessment & Plan:   Problem List Items Addressed This Visit       Other   Shingles    Acute for 3 days to entire right arm.  At this time start Valtrex 1000 MG TID for 14 days and Triamcinolone + Voltaren cream as needed.  Recommend to avoid touching other areas of skin and avoid contact with other people until blisters are no longer present.  Educated her on shingles at length.  Return to office in 2 weeks for recheck.      Relevant Medications   valACYclovir (VALTREX) 1000 MG tablet     Follow up plan: Return in about 2 weeks (around 10/09/2021) for Shingles.

## 2021-09-25 NOTE — Assessment & Plan Note (Signed)
Acute for 3 days to entire right arm.  At this time start Valtrex 1000 MG TID for 14 days and Triamcinolone + Voltaren cream as needed.  Recommend to avoid touching other areas of skin and avoid contact with other people until blisters are no longer present.  Educated her on shingles at length.  Return to office in 2 weeks for recheck.

## 2021-09-25 NOTE — Patient Instructions (Signed)
May use Voltaren gel to rash as well.  Shingles  Shingles is an infection. It gives you a painful skin rash and blisters that have fluid in them. Shingles is caused by the same germ (virus) that causes chickenpox. Shingles only happens in people who: Have had chickenpox. Have been given a shot (vaccine) to protect against chickenpox. Shingles is rare in this group. What are the causes? This condition is caused by varicella-zoster virus. This is the same germ that causes chickenpox. After a person is exposed to the germ, the germ stays in the body but is not active (dormant). Shingles develops if the germ becomes active again (is reactivated). This can happen many years after the first exposure to the germ. It is not known what causes this germ to become active again. What increases the risk? People who have had chickenpox or received the chickenpox shot are at risk for shingles. This infection is more common in people who: Are older than 70 years of age. Have a weakened disease-fighting system (immune system), such as people with: HIV (human immunodeficiency virus). AIDS (acquired immunodeficiency syndrome). Cancer. Are taking medicines that weaken the immune system, such as organ transplant medicines. Have a lot of stress. What are the signs or symptoms? The first symptoms of shingles may be itching, tingling, or pain in an area on your skin. A rash will show on your skin a few days or weeks later. This is what usually happens: The rash is likely to be on one side of your body. The rash usually has a shape like a belt or a band. Over time, the rash turns into fluid-filled blisters. The blisters will break open and change into scabs. The scabs usually dry up in about 2-3 weeks. You may also have: A fever. Chills. A headache. A feeling like you may vomit (nausea). How is this treated? The rash may last for several weeks. There is not a specific cure for this condition. Your doctor  may prescribe medicines. Medicines may: Help with pain. Help you get better sooner. Help to prevent long-term problems. Help with itching (antihistamines). If the area involved is on your face, you may need to see a specialist. This may be an eye doctor or an ear, nose, and throat (ENT) doctor. Follow these instructions at home: Medicines Take over-the-counter and prescription medicines only as told by your doctor. Put on an anti-itch cream or numbing cream where you have a rash, blisters, or scabs. Do this as told by your doctor. Helping with itching and discomfort  Put cold, wet cloths (cold compresses) on the area of the rash or blisters as told by your doctor. Cool baths can help you feel better. Try adding baking soda or dry oatmeal to the water to lessen itching. Do not bathe in hot water. Use calamine lotion as told by your doctor. Blister and rash care Keep your rash covered with a loose bandage (dressing). Wear loose clothing that does not rub on your rash. Wash your hands with soap and water for at least 20 seconds before and after you change your bandage. If you cannot use soap and water, use hand sanitizer. Change your bandage as told by your doctor. Keep your rash and blisters clean. To do this, wash the area with mild soap and cool water as told by your doctor. Check your rash every day for signs of infection. Check for: More redness, swelling, or pain. Fluid or blood. Warmth. Pus or a bad smell. Do not scratch  your rash. Do not pick at your blisters. To help you to not scratch: Keep your fingernails clean and cut short. Wear gloves or mittens when you sleep, if scratching is a problem. General instructions Rest as told by your doctor. Wash your hands often with soap and water for at least 20 seconds. If you cannot use soap and water, use hand sanitizer. Doing this lowers your chance of getting a skin infection. Your infection can cause chickenpox in people who have  never had chickenpox or never got a chickenpox vaccine shot. If you have blisters that did not change into scabs yet, try not to touch other people or be around other people, especially: Babies. Pregnant women. Children who have areas of red, itchy, or rough skin (eczema). Older people who have organ transplants. People who have a long-term (chronic) illness, like cancer or AIDS. Keep all follow-up visits. How is this prevented? A vaccine shot is the best way to prevent shingles and protect against shingles problems. If you have not had a vaccine shot, talk with your doctor about getting it. Where to find more information Centers for Disease Control and Prevention: http://www.wolf.info/ Contact a doctor if: Your pain does not get better with medicine. Your pain does not get better after the rash heals. You have any of these signs of infection around the rash: More redness, swelling, or pain. Fluid or blood. Warmth. Pus or a bad smell. You have a fever. Get help right away if: The rash is on your face or nose. You have pain in your face or pain by your eye. You lose feeling on one side of your face. You have trouble seeing. You have ear pain, or you have ringing in your ear. You have a loss of taste. Your condition gets worse. Summary Shingles gives you a painful skin rash and blisters that have fluid in them. Shingles is caused by the same germ (virus) that causes chickenpox. Keep your rash covered with a loose bandage. Wear loose clothing that does not rub on your rash. If you have blisters that did not change into scabs yet, try not to touch other people or be around people. This information is not intended to replace advice given to you by your health care provider. Make sure you discuss any questions you have with your health care provider. Document Revised: 01/07/2020 Document Reviewed: 01/07/2020 Elsevier Patient Education  [place].

## 2021-10-04 NOTE — Patient Instructions (Addendum)
Get 1st shingles vaccine from pharmacy on November 15th and then second on January 15th.  Shingles  Shingles is an infection. It gives you a painful skin rash and blisters that have fluid in them. Shingles is caused by the same germ (virus) that causes chickenpox. Shingles only happens in people who: Have had chickenpox. Have been given a shot (vaccine) to protect against chickenpox. Shingles is rare in this group. What are the causes? This condition is caused by varicella-zoster virus. This is the same germ that causes chickenpox. After a person is exposed to the germ, the germ stays in the body but is not active (dormant). Shingles develops if the germ becomes active again (is reactivated). This can happen many years after the first exposure to the germ. It is not known what causes this germ to become active again. What increases the risk? People who have had chickenpox or received the chickenpox shot are at risk for shingles. This infection is more common in people who: Are older than 70 years of age. Have a weakened disease-fighting system (immune system), such as people with: HIV (human immunodeficiency virus). AIDS (acquired immunodeficiency syndrome). Cancer. Are taking medicines that weaken the immune system, such as organ transplant medicines. Have a lot of stress. What are the signs or symptoms? The first symptoms of shingles may be itching, tingling, or pain in an area on your skin. A rash will show on your skin a few days or weeks later. This is what usually happens: The rash is likely to be on one side of your body. The rash usually has a shape like a belt or a band. Over time, the rash turns into fluid-filled blisters. The blisters will break open and change into scabs. The scabs usually dry up in about 2-3 weeks. You may also have: A fever. Chills. A headache. A feeling like you may vomit (nausea). How is this treated? The rash may last for several weeks. There is not  a specific cure for this condition. Your doctor may prescribe medicines. Medicines may: Help with pain. Help you get better sooner. Help to prevent long-term problems. Help with itching (antihistamines). If the area involved is on your face, you may need to see a specialist. This may be an eye doctor or an ear, nose, and throat (ENT) doctor. Follow these instructions at home: Medicines Take over-the-counter and prescription medicines only as told by your doctor. Put on an anti-itch cream or numbing cream where you have a rash, blisters, or scabs. Do this as told by your doctor. Helping with itching and discomfort  Put cold, wet cloths (cold compresses) on the area of the rash or blisters as told by your doctor. Cool baths can help you feel better. Try adding baking soda or dry oatmeal to the water to lessen itching. Do not bathe in hot water. Use calamine lotion as told by your doctor. Blister and rash care Keep your rash covered with a loose bandage (dressing). Wear loose clothing that does not rub on your rash. Wash your hands with soap and water for at least 20 seconds before and after you change your bandage. If you cannot use soap and water, use hand sanitizer. Change your bandage as told by your doctor. Keep your rash and blisters clean. To do this, wash the area with mild soap and cool water as told by your doctor. Check your rash every day for signs of infection. Check for: More redness, swelling, or pain. Fluid or blood. Warmth. Pus  or a bad smell. Do not scratch your rash. Do not pick at your blisters. To help you to not scratch: Keep your fingernails clean and cut short. Wear gloves or mittens when you sleep, if scratching is a problem. General instructions Rest as told by your doctor. Wash your hands often with soap and water for at least 20 seconds. If you cannot use soap and water, use hand sanitizer. Doing this lowers your chance of getting a skin infection. Your  infection can cause chickenpox in people who have never had chickenpox or never got a chickenpox vaccine shot. If you have blisters that did not change into scabs yet, try not to touch other people or be around other people, especially: Babies. Pregnant women. Children who have areas of red, itchy, or rough skin (eczema). Older people who have organ transplants. People who have a long-term (chronic) illness, like cancer or AIDS. Keep all follow-up visits. How is this prevented? A vaccine shot is the best way to prevent shingles and protect against shingles problems. If you have not had a vaccine shot, talk with your doctor about getting it. Where to find more information Centers for Disease Control and Prevention: http://www.wolf.info/ Contact a doctor if: Your pain does not get better with medicine. Your pain does not get better after the rash heals. You have any of these signs of infection around the rash: More redness, swelling, or pain. Fluid or blood. Warmth. Pus or a bad smell. You have a fever. Get help right away if: The rash is on your face or nose. You have pain in your face or pain by your eye. You lose feeling on one side of your face. You have trouble seeing. You have ear pain, or you have ringing in your ear. You have a loss of taste. Your condition gets worse. Summary Shingles gives you a painful skin rash and blisters that have fluid in them. Shingles is caused by the same germ (virus) that causes chickenpox. Keep your rash covered with a loose bandage. Wear loose clothing that does not rub on your rash. If you have blisters that did not change into scabs yet, try not to touch other people or be around people. This information is not intended to replace advice given to you by your health care provider. Make sure you discuss any questions you have with your health care provider. Document Revised: 01/07/2020 Document Reviewed: 01/07/2020 Elsevier Patient Education  [place].

## 2021-10-09 ENCOUNTER — Encounter: Payer: Self-pay | Admitting: Nurse Practitioner

## 2021-10-09 ENCOUNTER — Ambulatory Visit (INDEPENDENT_AMBULATORY_CARE_PROVIDER_SITE_OTHER): Payer: Medicare Other | Admitting: Nurse Practitioner

## 2021-10-09 VITALS — BP 102/70 | HR 86 | Temp 98.0°F | Ht 65.35 in | Wt 186.1 lb

## 2021-10-09 DIAGNOSIS — Z23 Encounter for immunization: Secondary | ICD-10-CM

## 2021-10-09 DIAGNOSIS — B029 Zoster without complications: Secondary | ICD-10-CM | POA: Diagnosis not present

## 2021-10-09 MED ORDER — SHINGRIX 50 MCG/0.5ML IM SUSR: 0.5000 mL | Freq: Once | INTRAMUSCULAR | 0 refills | Status: DC

## 2021-10-09 MED ORDER — GABAPENTIN 600 MG PO TABS: 300.0000 mg | ORAL_TABLET | Freq: Every day | ORAL | 2 refills | Status: DC

## 2021-10-09 MED ORDER — SHINGRIX 50 MCG/0.5ML IM SUSR: 0.5000 mL | Freq: Once | INTRAMUSCULAR | 0 refills | Status: AC

## 2021-10-09 NOTE — Assessment & Plan Note (Signed)
Overall improving at this time.  Some pain remains, will send in Gabapentin 300 MG to start taking at night for discomfort.  Educated her on this medication at length, including risks and benefits + side effects.  Discussed that she may not need to be on long term.  Shingrix ordered to obtain in November.

## 2021-10-09 NOTE — Progress Notes (Signed)
BP 102/70   Pulse 86   Temp 98 F (36.7 C) (Oral)   Ht 5' 5.35" (1.66 m)   Wt 186 lb 1.6 oz (84.4 kg)   LMP  (LMP Unknown)   SpO2 97%   BMI 30.64 kg/m    Subjective:    Patient ID: Chelsey [name], female    DOB: 1950/11/17, 72 y.o.   MRN: [id number]  HPI: Chelsey [name] is a 71 y.o. female  Chief Complaint  Patient presents with   Herpes Zoster    Patient is here for two week follow up on Shingles. Patient says everything is getting better.    RASH Follow-up for shingles, overall rash improving but continues to have pain to right hand and forearm.   Duration:  days  Location: arms right arm only Itching: no Burning: yes Redness: yes Oozing: no Scaling: no Blisters: yes Painful: yes Fevers: no Change in detergents/soaps/personal care products: no Recent illness: no Recent travel:no History of same: no Context: worse Alleviating factors: nothing Treatments attempted:hydrocortisone cream Shortness of breath: no  Throat/tongue swelling: no Myalgias/arthralgias: no   Relevant past medical, surgical, family and social history reviewed and updated as indicated. Interim medical history since our last visit reviewed. Allergies and medications reviewed and updated.  Review of Systems  Constitutional:  Negative for activity change, appetite change, diaphoresis, fatigue and fever.  Respiratory:  Negative for cough, chest tightness and shortness of breath.   Cardiovascular:  Negative for chest pain, palpitations and leg swelling.  Gastrointestinal: Negative.   Skin:  Positive for rash.  Neurological: Negative.   Psychiatric/Behavioral: Negative.      Per HPI unless specifically indicated above     Objective:    BP 102/70   Pulse 86   Temp 98 F (36.7 C) (Oral)   Ht 5' 5.35" (1.66 m)   Wt 186 lb 1.6 oz (84.4 kg)   LMP  (LMP Unknown)   SpO2 97%   BMI 30.64 kg/m   Wt Readings from Last 3 Encounters:  10/09/21 186 lb 1.6 oz (84.4 kg)  09/25/21 190 lb  11.2 oz (86.5 kg)  07/23/21 186 lb (84.4 kg)    Physical Exam Vitals and nursing note reviewed.  Constitutional:      General: She is awake. She is not in acute distress.    Appearance: She is well-developed and well-groomed. She is obese. She is not ill-appearing or toxic-appearing.  HENT:     Head: Normocephalic.     Right Ear: Hearing normal.     Left Ear: Hearing normal.  Eyes:     General: Lids are normal.        Right eye: No discharge.        Left eye: No discharge.     Conjunctiva/sclera: Conjunctivae normal.     Pupils: Pupils are equal, round, and reactive to light.  Neck:     Thyroid: No thyromegaly.     Vascular: No carotid bruit.  Cardiovascular:     Rate and Rhythm: Normal rate and regular rhythm.     Heart sounds: Normal heart sounds. No murmur heard.    No gallop.  Pulmonary:     Effort: Pulmonary effort is normal. No accessory muscle usage or respiratory distress.     Breath sounds: Normal breath sounds.  Abdominal:     General: Bowel sounds are normal.     Palpations: Abdomen is soft. There is no hepatomegaly or splenomegaly.  Musculoskeletal:     Cervical  back: Normal range of motion and neck supple.     Right lower leg: No edema.     Left lower leg: No edema.  Skin:    General: Skin is warm and dry.     Findings: Rash present.     Comments: To entire right arm, extending from shoulder/axilla to right hand around thumb. Areas now crusted over with no further vesicles -- overall healing.  Neurological:     Mental Status: She is alert and oriented to person, place, and time.  Psychiatric:        Attention and Perception: Attention normal.        Mood and Affect: Mood normal.        Speech: Speech normal.        Behavior: Behavior normal. Behavior is cooperative.        Thought Content: Thought content normal.     Results for orders placed or performed in visit on 02/17/21  Novel Coronavirus, NAA ([facility])   Specimen: Nasopharyngeal(NP) swabs in  vial transport medium  Result Value Ref Range   SARS-CoV-2, NAA Not Detected Not Detected  SARS-COV-2, NAA 2 DAY TAT  Result Value Ref Range   SARS-CoV-2, NAA 2 DAY TAT Performed   Influenza A & B (STAT)  Result Value Ref Range   Influenza A Negative Negative   Influenza B Negative Negative      Assessment & Plan:   Problem List Items Addressed This Visit       Other   Shingles - Primary    Overall improving at this time.  Some pain remains, will send in Gabapentin 300 MG to start taking at night for discomfort.  Educated her on this medication at length, including risks and benefits + side effects.  Discussed that she may not need to be on long term.  Shingrix ordered to obtain in November.      Relevant Medications   gabapentin (NEURONTIN) 600 MG tablet   Zoster Vaccine Adjuvanted [facility]) injection (Start on 12/09/2021)   Zoster Vaccine Adjuvanted [facility]) injection (Start on 02/08/2022)   Other Visit Diagnoses     Need for shingles vaccine       To obtain # 1 in November.   Relevant Medications   Zoster Vaccine Adjuvanted [facility]) injection (Start on 12/09/2021)   Zoster Vaccine Adjuvanted [facility]) injection (Start on 02/08/2022)        Follow up plan: Return for as scheduled for physical January.

## 2021-10-21 ENCOUNTER — Ambulatory Visit
Admission: RE | Admit: 2021-10-21 | Discharge: 2021-10-21 | Disposition: A | Discharge status: Home / Self Care | Payer: Medicare Other | Source: Ambulatory Visit | Attending: Nurse Practitioner | Admitting: Nurse Practitioner

## 2021-10-21 DIAGNOSIS — Z1231 Encounter for screening mammogram for malignant neoplasm of breast: Secondary | ICD-10-CM | POA: Diagnosis not present

## 2021-10-22 ENCOUNTER — Other Ambulatory Visit: Payer: Self-pay | Admitting: Nurse Practitioner

## 2021-10-22 DIAGNOSIS — R928 Other abnormal and inconclusive findings on diagnostic imaging of breast: Secondary | ICD-10-CM

## 2021-10-22 DIAGNOSIS — N6489 Other specified disorders of breast: Secondary | ICD-10-CM

## 2021-10-27 ENCOUNTER — Ambulatory Visit: Payer: Medicare Other

## 2021-11-02 ENCOUNTER — Other Ambulatory Visit: Payer: Self-pay

## 2021-11-02 ENCOUNTER — Other Ambulatory Visit: Payer: Self-pay | Admitting: Nurse Practitioner

## 2021-11-02 MED ORDER — BUSPIRONE HCL 10 MG PO TABS: 10.0000 mg | ORAL_TABLET | Freq: Three times a day (TID) | ORAL | 12 refills | Status: DC

## 2021-11-02 MED ORDER — BUSPIRONE HCL 10 MG PO TABS: 10.0000 mg | ORAL_TABLET | Freq: Two times a day (BID) | ORAL | 3 refills | Status: DC

## 2021-11-02 NOTE — Telephone Encounter (Signed)
Medication refill for Buspirone 10 mg last ov 10/09/21, upcoming ov 01/26/22 . Please advise

## 2021-11-03 ENCOUNTER — Ambulatory Visit (INDEPENDENT_AMBULATORY_CARE_PROVIDER_SITE_OTHER): Payer: Medicare Other | Admitting: *Deleted

## 2021-11-03 DIAGNOSIS — Z0000 Encounter for general adult medical examination without abnormal findings: Secondary | ICD-10-CM

## 2021-11-03 NOTE — Progress Notes (Signed)
Subjective:   Chelsey [name] is a 71 y.o. female who presents for Medicare Annual (Subsequent) preventive examination.  I connected with  KENEDI CILIA on 11/03/21 by a telephone enabled telemedicine application and verified that I am speaking with the correct person using two identifiers.   I discussed the limitations of evaluation and management by telemedicine. The patient expressed understanding and agreed to proceed.  Patient location: home  Provider location: Tele-health-home   Review of Systems     Cardiac Risk Factors include: advanced age (>38mn, >>53women);family history of premature cardiovascular disease;smoking/ tobacco exposure;obesity (BMI >30kg/m2);sedentary lifestyle     Objective:    Today's Vitals   There is no height or weight on file to calculate BMI.     11/03/2021   11:37 AM 10/15/2019    1:07 PM 09/25/2019   10:39 PM 09/24/2019    7:01 PM 07/16/2018   12:41 PM 07/14/2018    9:10 PM 07/14/2018    1:30 PM  Advanced Directives  Does Patient Have a Medical Advance Directive? No   No No No No  Would patient like information on creating a medical advance directive? No - Patient declined     No - Patient declined      Information is confidential and restricted. Go to Review Flowsheets to unlock data.    Current Medications (verified) Outpatient Encounter Medications as of 11/03/2021  Medication Sig   atorvastatin (LIPITOR) 20 MG tablet Take 1 tablet (20 mg total) by mouth daily.   buPROPion (WELLBUTRIN XL) 300 MG 24 hr tablet One tab Once a day   busPIRone (BUSPAR) 10 MG tablet Take 1 tablet (10 mg total) by mouth 3 (three) times daily.   docusate sodium (COLACE) 100 MG capsule Take 1 capsule (100 mg total) by mouth 2 (two) times daily.   gabapentin (NEURONTIN) 600 MG tablet Take 0.5 tablets (300 mg total) by mouth at bedtime.   pantoprazole (PROTONIX) 40 MG tablet Take 1 tablet (40 mg total) by mouth daily.   polyethylene glycol (MIRALAX /  GLYCOLAX) 17 g packet Take 17 g by mouth daily.   trazodone (DESYREL) 300 MG tablet TAKE 1 TABLET(300 MG) BY MOUTH AT BEDTIME   triamcinolone cream (KENALOG) 0.1 % Apply 1 Application topically 2 (two) times daily.   [START ON 12/09/2021] Zoster Vaccine Adjuvanted ([facility] injection Inject 0.5 mLs into the muscle once for 1 dose.   [START ON 02/08/2022] Zoster Vaccine Adjuvanted ([facility] injection Inject 0.5 mLs into the muscle once for 1 dose.   No facility-administered encounter medications on file as of 11/03/2021.    Allergies (verified) Patient has no known allergies.   History: Past Medical History:  Diagnosis [place]   [place]   [place], [place]   Bronchitis    recent/ 06/09/15 [facility] chest x[facility]/resolved   Can[facility] [place]   right lumpectomy,L/Ad/R    GERD (gastroesophageal reflux disease)    History of hiatal hernia    Hypercholesteremia    Hyperlipidemia    Low BP    TYPICALLY RUNS 80'S/60'S   Panic attack    Personal history of malignant neoplasm of breast    Past Surgical History:  Procedure Laterality Date   BICEPT TENODESIS Left 11/23/2016   Procedure: BICEPS TENODESIS;  Surgeon: PLeim Fabry MD;  Location: [facility] ORS;  Service: Orthopedics;  Laterality: Left;   BREAST EXCISIONAL BIOPSY Right 1997   pos   BREAST SURGERY Right 1997   lumpectomy  CHOLECYSTECTOMY N/A 08/06/2016   Procedure: LAPAROSCOPIC CHOLECYSTECTOMY WITH INTRAOPERATIVE CHOLANGIOGRAM;  Surgeon: Christene Lye, MD;  Location: [facility];  Service: General;  Laterality: N/A;   COLONOSCOPY  2008   COLONOSCOPY WITH PROPOFOL N/A 07/21/2015   Procedure: COLONOSCOPY WITH PROPOFOL;  Surgeon: Lucilla Lame, MD;  Location: [place];  Service: Endoscopy;  Laterality: N/A;   COLONOSCOPY WITH PROPOFOL N/A 01/05/2018   Procedure: COLONOSCOPY WITH PROPOFOL;  Surgeon: Jonathon Bellows, MD;  Location: [facility] ENDOSCOPY;  Service: Gastroenterology;  Laterality: N/A;    ESOPHAGOGASTRODUODENOSCOPY (EGD) WITH PROPOFOL N/A 06/16/2016   Procedure: ESOPHAGOGASTRODUODENOSCOPY (EGD) WITH PROPOFOL;  Surgeon: Christene Lye, MD;  Location: [facility] ENDOSCOPY;  Service: Endoscopy;  Laterality: N/A;   ESOPHAGOGASTRODUODENOSCOPY (EGD) WITH PROPOFOL N/A 01/05/2018   Procedure: ESOPHAGOGASTRODUODENOSCOPY (EGD) WITH PROPOFOL;  Surgeon: Jonathon Bellows, MD;  Location[facility] ENDOSCOPY;  Service: Gastroenterology;  Laterality: N/A;   ESOPHAGOGASTRODUODENOSCOPY (EGD) WITH PROPOFOL N/A 06/13/2018   Procedure: ESOPHAGOGASTRODUODENOSCOPY (EGD) WITH PROPOFOL;  Surgeon: Jonathon Bellows, MD;  [facility] END[facility]COPY;  Service: Gastroenterology;  Laterality: N/A;   ESOPHAGOGASTRODUODENOSCOPY (EGD) WITH PROPOFOL N/A 07/16/2018   Procedure: ESOPHAGOGASTRODUODENOSCOPY (EGD) WITH PROPOFOL;  Surgeon: Lucilla Lame, MD;  Location: [facility] ENDOSCOPY;  Service: Endoscopy;  Laterality: N/A;   HARDWARE REMOVAL Left 12/27/2016   Procedure: HARDWARE REMOVAL LEFT  PROXIMAL HUMEROUS;  Surgeon: Leim Fabry, MD;  Location: [facility];  Service: Orthopedics;  Laterality: Left;   IR GJ TUBE CHANGE  07/01/3708   NISSEN FUNDOPLICATION N/A 07/21/9483   Procedure: NISSEN FUNDOPLICATION;  Surgeon: Jules Husbands, MD;  Location: [facility];  Service: General;  Laterality: N/A;   ORIF HUMERUS FRACTURE Left 11/23/2016   Procedure: OPEN REDUCTION INTERNAL FIXATION (ORIF) PROXIMAL HUMERUS FRACTURE;  Surgeon: Leim Fabry, MD;  Location: [facility];  Service: Orthopedics;  Laterality: Left;   REPAIR OF ESOPHAGUS  03/09/2018   Procedure: REPAIR OF ESOPHAGUS;  Surgeon: Jules Husbands, MD;  Location: [facility];  Service: General;;   REVERSE SHOULDER ARTHROPLASTY Left 12/27/2016   Procedure: REVERSE SHOULDER ARTHROPLASTY;  Surgeon: Leim Fabry, MD;  Location: [facility];  Service: Orthopedics;  Laterality: Left;   ROBOTIC ASSISTED LAPAROSCOPIC REPAIR OF PARAESOPHAGEAL HERNIA N/A 03/09/2018   Procedure: ROBOTIC HIATAL HERNIA CONVERTED TO  OPEN;  Surgeon: Jules Husbands, MD;  Location: [facility];  Service: General;  Laterality: N/A;   Family History  Problem Relation Age of Onset   Anxiety disorder Brother    Obesity Brother    COPD Brother    Kidney disease Mother    Heart attack Father    Hypertension Father    Alcohol abuse Father    Depression Brother    Anxiety disorder Brother    Breast cancer Maternal Grandmother    Social History   Socioeconomic History   Marital status: Widowed    Spouse name: Not on file   Number of children: 2   Years of education: Not on file   Highest education level: Bachelor's degree (e.g., BA, AB, BS)  Occupational History   Not on file  Tobacco Use   Smoking status: Former    Packs/day: 0.10    Years: 10.00    Total pack years: 1.00    Types: Cigarettes    Start date: 11/25/2009    Quit date: 10/22/2019    Years since quitting: 2.0   Smokeless tobacco: Never   Tobacco comments:    1 cigarette daily  Vaping Use   Vaping Use: Never used  Substance and Sexual Activity  Alcohol use: No    Alcohol/week: 0.0 standard drinks of alcohol   Drug use: No   Sexual activity: Never  Other Topics Concern   Not on file  Social History Narrative   Not on file   Social Determinants of Health   Financial Resource Strain: Low Risk  (11/03/2021)   Overall Financial Resource Strain (CARDIA)    Difficulty of Paying Living Expenses: Not very hard  Food Insecurity: No Food Insecurity (11/03/2021)   Hunger Vital Sign    Worried About Running Out of Food in the Last Year: Never true    Ran Out of Food in the Last Year: Never true  Transportation Needs: Unknown (11/03/2021)   PRAPARE - Hydrologist (Medical): Not on file    Lack of Transportation (Non-Medical): No  Physical Activity: Inactive (11/03/2021)   Exercise Vital Sign    Days of Exercise per Week: 0 days    Minutes of Exercise per Session: 0 min  Stress: No Stress Concern Present (11/03/2021)    [place]    Feeling of Stress : Not at all  Social Connections: Socially Isolated (11/03/2021)   Social Connection and Isolation Panel [NHANES]    Frequency of Communication with Friends and Family: Twice a week    Frequency of Social Gatherings with Friends and Family: Never    Attends Religious Services: More than 4 times per year    Active Member of [employer] or Organizations: Not on file    Attends Archivist Meetings: Never    Marital Status: Widowed    Tobacco Counseling Counseling given: Not Answered Tobacco comments: 1 cigarette daily   Clinical Intake:  Pre-visit preparation completed: Yes  Pain : No/denies pain     Diabetes: No  How often do you need to have someone help you when you read instructions, pamphlets, or other written materials from your doctor or pharmacy?: 1 - Never  Diabetic?  no  Interpreter Needed?: No  Information entered by :: Leroy Kennedy LPN   Activities of Daily Living    11/03/2021   11:44 AM  In your present state of health, do you have any difficulty performing the following activities:  Hearing? 0  Vision? 0  Difficulty concentrating or making decisions? 0  Walking or climbing stairs? 0  Dressing or bathing? 0  Doing errands, shopping? 0  Preparing Food and eating ? N  Using the Toilet? N  In the past six months, have you accidently leaked urine? N  Do you have problems with loss of bowel control? N  Managing your Medications? N  Managing your Finances? N  Housekeeping or managing your Housekeeping? N    Patient Care Team: Venita Lick, NP as PCP - General (Nurse Practitioner) Christene Lye, MD (General Surgery) Guadalupe Maple, MD (Family Medicine)  Indicate any recent Medical Services you may have received from other than [facility] providers in the past year (date may be approximate).     Assessment:   This is a routine wellness  examination for Shiane.  Hearing/Vision screen Hearing Screening - Comments:: No issue hearing Vision Screening - Comments:: Not up to date  Dietary issues and exercise activities discussed: Current Exercise Habits: The patient does not participate in regular exercise at present   Goals Addressed             This Visit's Progress    Patient Stated  Continue current lifestyle       Depression Screen    11/03/2021   11:39 AM 10/09/2021   10:19 AM 09/25/2021   11:07 AM 07/23/2021   11:22 AM 01/22/2021    9:44 AM 09/25/2020    3:08 PM 06/24/2020   11:34 AM  PHQ 2/9 Scores  PHQ - 2 Score 0 0 0 0 0 0 0  PHQ- 9 Score 0 1 0 0 2 0 2    Fall Risk    11/03/2021   11:37 AM 10/09/2021   10:20 AM 09/25/2021   11:07 AM 07/23/2021   11:22 AM 09/25/2020    3:07 PM  [place] in the past year? 0 0 1 0 0  Number falls in past yr: 0 0 1 0 0  Injury with Fall? 0 0 0 0 0  Risk for fall due to :  No Fall Risks History of fall(s) No Fall Risks No Fall Risks  Follow up Falls evaluation completed;Education provided;Falls prevention discussed Falls evaluation completed Falls evaluation completed Falls evaluation completed Falls evaluation completed    FALL RISK PREVENTION PERTAINING TO THE HOME:  Any stairs in or around the home? Yes  If so, are there any without handrails? No  Home free of loose throw rugs in walkways, pet beds, electrical cords, etc? Yes  Adequate lighting in your home to reduce risk of falls? Yes   ASSISTIVE DEVICES UTILIZED TO PREVENT FALLS:  Life alert? No  Use of a cane, walker or w/c? No  Grab bars in the bathroom? No  Shower chair or bench in shower? No  Elevated toilet seat or a handicapped toilet? No   TIMED UP AND GO:  Was the test performed? No .    Cognitive Function:        11/03/2021   11:36 AM  6CIT Screen  What Year? 0 points  What month? 0 points  What time? 0 points  Count back from 20 0 points  Months in reverse 0 points   Repeat phrase 0 points  Total Score 0 points    Immunizations Immunization History  Administered Date(s) Administered   Fluad Quad(high Dose 65+) 01/21/2020, 01/22/2021   Influenza, High Dose Seasonal PF 10/28/2017   Influenza,inj,Quad PF,6+ Mos 01/05/2016   Influenza-Unspecified 11/20/2014, 10/18/2016   PFIZER(Purple Top)SARS-COV-2 Vaccination 09/27/2019, 10/27/2019   Pneumococcal Conjugate-13 07/05/2016   Pneumococcal Polysaccharide-23 03/10/2018   Td 02/14/2007   Tdap 07/27/2017   Zoster, Live 05/29/2013    TDAP status: Up to date  Flu Vaccine status: Due, Education has been provided regarding the importance of this vaccine. Advised may receive this vaccine at local pharmacy or Health Dept. Aware to provide a copy of the vaccination record if obtained from local pharmacy or Health Dept. Verbalized acceptance and understanding.  Pneumococcal vaccine status: Up to date  Covid-19 vaccine status: Information provided on how to obtain vaccines.   Qualifies for Shingles Vaccine? Yes   Zostavax completed No   Shingrix Completed?: No.    Education has been provided regarding the importance of this vaccine. Patient has been advised to call insurance company to determine out of pocket expense if they have not yet received this vaccine. Advised may also receive vaccine at local pharmacy or Health Dept. Verbalized acceptance and understanding.  Screening Tests Health Maintenance  Topic Date Due   COVID-19 Vaccine (3 - Pfizer risk series) 11/24/2019   Zoster Vaccines- Shingrix (1 of 2) 01/08/2022 (Originally 07/05/1969)   INFLUENZA  VACCINE  04/25/2022 (Originally 08/25/2021)   COLONOSCOPY (Pts 45-46yr Insurance coverage will need to be confirmed)  01/06/2023   MAMMOGRAM  10/22/2023   TETANUS/TDAP  07/28/2027   Pneumonia Vaccine 70 Years old  Completed   DEXA SCAN  Completed   Hepatitis C Screening  Completed   HPV VACCINES  Aged Out    Health Maintenance  Health Maintenance Due   Topic Date Due   COVID-19 Vaccine (3 - Pfizer risk series) 11/24/2019    Colorectal cancer screening: Type of screening: Colonoscopy. Completed 2019. Repeat every 5 years  Mammogram status: Completed  . Repeat every year  is scheduled for a repeat with ultrasound    Lung Cancer Screening: (Low Dose CT Chest recommended if Age 70-80years, 30 pack-year currently smoking OR have quit w/in 15years.) does not qualify.   Lung Cancer Screening Referral:   Additional Screening:  Hepatitis C Screening: does not qualify; Completed 2017  Vision Screening: Recommended annual ophthalmology exams for early detection of glaucoma and other disorders of the eye. Is the patient up to date with their annual eye exam?  No  Who is the provider or what is the name of the office in which the patient attends annual eye exams?  If pt is not established with a provider, would they like to be referred to a provider to establish care? No .   Dental Screening: Recommended annual dental exams for proper oral hygiene  Community Resource Referral / Chronic Care Management: CRR required this visit?  No   CCM required this visit?  No      Plan:     I have personally reviewed and noted the following in the patient's chart:   Medical and social history Use of alcohol, tobacco or illicit drugs  Current medications and supplements including opioid prescriptions. Patient is not currently taking opioid prescriptions. Functional ability and status Nutritional status Physical activity Advanced directives List of other physicians Hospitalizations, surgeries, and ER visits in previous 12 months Vitals Screenings to include cognitive, depression, and falls Referrals and appointments  In addition, I have reviewed and discussed with patient certain preventive protocols, quality metrics, and best practice recommendations. A written personalized care plan for preventive services as well as general preventive  health recommendations were provided to patient.     JLeroy Kennedy LPN   154/49/2010  Nurse Notes:

## 2021-11-03 NOTE — Patient Instructions (Signed)
Chelsey [name] , Thank you for taking time to come for your Medicare Wellness Visit. I appreciate your ongoing commitment to your health goals. Please review the following plan we discussed and let me know if I can assist you in the future.   These are the goals we discussed:  Goals      Patient Stated     Continue current lifestyle        This is a list of the screening recommended for you and due dates:  Health Maintenance  Topic Date Due   COVID-19 Vaccine (3 - Pfizer risk series) 11/24/2019   Zoster (Shingles) Vaccine (1 of 2) 01/08/2022*   Flu Shot  04/25/2022*   Colon Cancer Screening  01/06/2023   Mammogram  10/22/2023   Tetanus Vaccine  07/28/2027   Pneumonia Vaccine  Completed   DEXA scan (bone density measurement)  Completed   Hepatitis C Screening: USPSTF Recommendation to screen - Ages 65-79 yo.  Completed   HPV Vaccine  Aged Out  *Topic was postponed. The date shown is not the original due date.    Advanced directives: Education provided  Conditions/risks identified:   Next appointment: Follow up in one year for your annual wellness visit  01-26-2022 @ 10:00  [facility] 65 Years and Older, Female Preventive care refers to lifestyle choices and visits with your health care provider that can promote health and wellness. What does preventive care include? A yearly physical exam. This is also called an annual well check. Dental exams once or twice a year. Routine eye exams. Ask your health care provider how often you should have your eyes checked. Personal lifestyle choices, including: Daily care of your teeth and gums. Regular physical activity. Eating a healthy diet. Avoiding tobacco and drug use. Limiting alcohol use. Practicing safe sex. Taking low-dose aspirin every day. Taking vitamin and mineral supplements as recommended by your health care provider. What happens during an annual well check? The services and screenings done by your health  care provider during your annual well check will depend on your age, overall health, lifestyle risk factors, and family history of disease. Counseling  Your health care provider may ask you questions about your: Alcohol use. Tobacco use. Drug use. Emotional well-being. Home and relationship well-being. Sexual activity. Eating habits. History of falls. Memory and ability to understand (cognition). Work and work Statistician. Reproductive health. Screening  You may have the following tests or measurements: Height, weight, and BMI. Blood pressure. Lipid and cholesterol levels. These may be checked every 5 years, or more frequently if you are over 37 years old. Skin check. Lung cancer screening. You may have this screening every year starting at age 63 if you have a 30-pack-year history of smoking and currently smoke or have quit within the past 15 years. Fecal occult blood test (FOBT) of the stool. You may have this test every year starting at age 57. Flexible sigmoidoscopy or colonoscopy. You may have a sigmoidoscopy every 5 years or a colonoscopy every 10 years starting at age 37. Hepatitis C blood test. Hepatitis B blood test. Sexually transmitted disease (STD) testing. Diabetes screening. This is done by checking your blood sugar (glucose) after you have not eaten for a while (fasting). You may have this done every 1-3 years. Bone density scan. This is done to screen for osteoporosis. You may have this done starting at age 40. Mammogram. This may be done every 1-2 years. Talk to your health care provider about  how often you should have regular mammograms. Talk with your health care provider about your test results, treatment options, and if necessary, the need for more tests. Vaccines  Your health care provider may recommend certain vaccines, such as: Influenza vaccine. This is recommended every year. Tetanus, diphtheria, and acellular pertussis (Tdap, Td) vaccine. You may need a Td  booster every 10 years. Zoster vaccine. You may need this after age 98. Pneumococcal 13-valent conjugate (PCV13) vaccine. One dose is recommended after age 72. Pneumococcal polysaccharide (PPSV23) vaccine. One dose is recommended after age 59. Talk to your health care provider about which screenings and vaccines you need and how often you need them. This information is not intended to replace advice given to you by your health care provider. Make sure you discuss any questions you have with your health care provider. Document Released: 02/07/2015 Document Revised: 10/01/2015 Document Reviewed: 11/12/2014 Elsevier Interactive Patient Education  2017 [place] Prevention in the Home Falls can cause injuries. They can happen to people of all ages. There are many things you can do to make your home safe and to help prevent falls. What can I do on the outside of my home? Regularly fix the edges of walkways and driveways and fix any cracks. Remove anything that might make you trip as you walk through a door, such as a raised step or threshold. Trim any bushes or trees on the path to your home. Use bright outdoor lighting. Clear any walking paths of anything that might make someone trip, such as rocks or tools. Regularly check to see if handrails are loose or broken. Make sure that both sides of any steps have handrails. Any raised decks and porches should have guardrails on the edges. Have any leaves, snow, or ice cleared regularly. Use sand or salt on walking paths during winter. Clean up any spills in your garage right away. This includes oil or grease spills. What can I do in the bathroom? Use night lights. Install grab bars by the toilet and in the tub and shower. Do not use towel bars as grab bars. Use non-skid mats or decals in the tub or shower. If you need to sit down in the shower, use a plastic, non-slip stool. Keep the floor dry. Clean up any water that spills on the floor  as soon as it happens. Remove soap buildup in the tub or shower regularly. Attach bath mats securely with double-sided non-slip rug tape. Do not have throw rugs and other things on the floor that can make you trip. What can I do in the bedroom? Use night lights. Make sure that you have a light by your bed that is easy to reach. Do not use any sheets or blankets that are too big for your bed. They should not hang down onto the floor. Have a firm chair that has side arms. You can use this for support while you get dressed. Do not have throw rugs and other things on the floor that can make you trip. What can I do in the kitchen? Clean up any spills right away. Avoid walking on wet floors. Keep items that you use a lot in easy-to-reach places. If you need to reach something above you, use a strong step stool that has a grab bar. Keep electrical cords out of the way. Do not use floor polish or wax that makes floors slippery. If you must use wax, use non-skid floor wax. Do not have throw rugs and other  things on the floor that can make you trip. What can I do with my stairs? Do not leave any items on the stairs. Make sure that there are handrails on both sides of the stairs and use them. Fix handrails that are broken or loose. Make sure that handrails are as long as the stairways. Check any carpeting to make sure that it is firmly attached to the stairs. Fix any carpet that is loose or worn. Avoid having throw rugs at the top or bottom of the stairs. If you do have throw rugs, attach them to the floor with carpet tape. Make sure that you have a light switch at the top of the stairs and the bottom of the stairs. If you do not have them, ask someone to add them for you. What else can I do to help prevent falls? Wear shoes that: Do not have high heels. Have rubber bottoms. Are comfortable and fit you well. Are closed at the toe. Do not wear sandals. If you use a stepladder: Make sure that it is  fully opened. Do not climb a closed stepladder. Make sure that both sides of the stepladder are locked into place. Ask someone to hold it for you, if possible. Clearly mark and make sure that you can see: Any grab bars or handrails. First and last steps. Where the edge of each step is. Use tools that help you move around (mobility aids) if they are needed. These include: Canes. Walkers. Scooters. Crutches. Turn on the lights when you go into a dark area. Replace any light bulbs as soon as they burn out. Set up your furniture so you have a clear path. Avoid moving your furniture around. If any of your floors are uneven, fix them. If there are any pets around you, be aware of where they are. Review your medicines with your doctor. Some medicines can make you feel dizzy. This can increase your chance of falling. Ask your doctor what other things that you can do to help prevent falls. This information is not intended to replace advice given to you by your health care provider. Make sure you discuss any questions you have with your health care provider. Document Released: 11/07/2008 Document Revised: 06/19/2015 Document Reviewed: 02/15/2014 Elsevier Interactive Patient Education  2017 [employer].

## 2021-11-10 ENCOUNTER — Ambulatory Visit
Admission: RE | Admit: 2021-11-10 | Discharge: 2021-11-10 | Disposition: A | Discharge status: Home / Self Care | Payer: Medicare Other | Source: Ambulatory Visit | Attending: Nurse Practitioner | Admitting: Nurse Practitioner

## 2021-11-10 DIAGNOSIS — N6489 Other specified disorders of breast: Secondary | ICD-10-CM | POA: Diagnosis not present

## 2021-11-10 DIAGNOSIS — N6311 Unspecified lump in the right breast, upper outer quadrant: Secondary | ICD-10-CM | POA: Diagnosis not present

## 2021-11-10 DIAGNOSIS — R928 Other abnormal and inconclusive findings on diagnostic imaging of breast: Secondary | ICD-10-CM | POA: Diagnosis not present

## 2021-11-11 ENCOUNTER — Other Ambulatory Visit: Payer: Self-pay | Admitting: Nurse Practitioner

## 2021-11-11 ENCOUNTER — Other Ambulatory Visit: Payer: Self-pay | Admitting: Internal Medicine

## 2021-11-11 DIAGNOSIS — R928 Other abnormal and inconclusive findings on diagnostic imaging of breast: Secondary | ICD-10-CM

## 2021-11-11 DIAGNOSIS — N63 Unspecified lump in unspecified breast: Secondary | ICD-10-CM

## 2021-11-26 ENCOUNTER — Ambulatory Visit
Admission: RE | Admit: 2021-11-26 | Discharge: 2021-11-26 | Disposition: A | Discharge status: Home / Self Care | Payer: Medicare Other | Source: Ambulatory Visit | Attending: Nurse Practitioner | Admitting: Nurse Practitioner

## 2021-11-26 DIAGNOSIS — R928 Other abnormal and inconclusive findings on diagnostic imaging of breast: Secondary | ICD-10-CM | POA: Insufficient documentation

## 2021-11-26 DIAGNOSIS — N63 Unspecified lump in unspecified breast: Secondary | ICD-10-CM | POA: Insufficient documentation

## 2021-11-26 DIAGNOSIS — R590 Localized enlarged lymph nodes: Secondary | ICD-10-CM | POA: Diagnosis not present

## 2021-11-26 DIAGNOSIS — C50411 Malignant neoplasm of upper-outer quadrant of right female breast: Secondary | ICD-10-CM | POA: Diagnosis not present

## 2021-11-27 LAB — SURGICAL PATHOLOGY

## 2021-11-28 ENCOUNTER — Encounter: Payer: Self-pay | Admitting: Nurse Practitioner

## 2021-11-28 DIAGNOSIS — C50911 Malignant neoplasm of unspecified site of right female breast: Secondary | ICD-10-CM | POA: Insufficient documentation

## 2021-11-28 DIAGNOSIS — C50919 Malignant neoplasm of unspecified site of unspecified female breast: Secondary | ICD-10-CM | POA: Insufficient documentation

## 2021-11-30 ENCOUNTER — Encounter: Payer: Self-pay | Admitting: *Deleted

## 2021-11-30 DIAGNOSIS — C50919 Malignant neoplasm of unspecified site of unspecified female breast: Secondary | ICD-10-CM

## 2021-11-30 NOTE — Progress Notes (Signed)
Received referral for newly diagnosed breast cancer from [facility].  Navigation initiated.  Med Onc and Surgical consults scheduled.  She will see Dr. Lysle Pearl 11/8 at 2:15 and Dr. Grayland Ormond at 11:15.

## 2021-12-01 ENCOUNTER — Telehealth: Payer: Self-pay | Admitting: *Deleted

## 2021-12-01 NOTE — Telephone Encounter (Signed)
Nurse placed call to patient to review appointment details for upcoming new oncology consultation visit. Patient denies any questions or concerns regarding visit. Advised of appointment time and details, patient may bring one support person to visit.

## 2021-12-02 ENCOUNTER — Inpatient Hospital Stay: Payer: Medicare Other | Attending: Oncology | Admitting: Oncology

## 2021-12-02 ENCOUNTER — Encounter: Payer: Self-pay | Admitting: Oncology

## 2021-12-02 ENCOUNTER — Inpatient Hospital Stay: Payer: Medicare Other

## 2021-12-02 ENCOUNTER — Encounter: Payer: Self-pay | Admitting: *Deleted

## 2021-12-02 ENCOUNTER — Ambulatory Visit: Payer: Self-pay | Admitting: Surgery

## 2021-12-02 VITALS — BP 121/95 | HR 81 | Temp 98.7°F | Resp 16 | Ht 65.35 in | Wt 193.4 lb

## 2021-12-02 DIAGNOSIS — Z853 Personal history of malignant neoplasm of breast: Secondary | ICD-10-CM | POA: Diagnosis not present

## 2021-12-02 DIAGNOSIS — Z923 Personal history of irradiation: Secondary | ICD-10-CM | POA: Diagnosis not present

## 2021-12-02 DIAGNOSIS — F17200 Nicotine dependence, unspecified, uncomplicated: Secondary | ICD-10-CM | POA: Diagnosis not present

## 2021-12-02 DIAGNOSIS — C50411 Malignant neoplasm of upper-outer quadrant of right female breast: Secondary | ICD-10-CM | POA: Diagnosis not present

## 2021-12-02 DIAGNOSIS — Z170 Estrogen receptor positive status [ER+]: Secondary | ICD-10-CM | POA: Insufficient documentation

## 2021-12-02 DIAGNOSIS — Z87891 Personal history of nicotine dependence: Secondary | ICD-10-CM | POA: Insufficient documentation

## 2021-12-02 DIAGNOSIS — C50911 Malignant neoplasm of unspecified site of right female breast: Secondary | ICD-10-CM | POA: Insufficient documentation

## 2021-12-02 DIAGNOSIS — Z803 Family history of malignant neoplasm of breast: Secondary | ICD-10-CM | POA: Insufficient documentation

## 2021-12-02 NOTE — Progress Notes (Signed)
Chelsey [name]  Telephone:([phone]) [phone] Fax:(336) [phone]  ID: Chelsey [name] OB: 1950-05-24  MR#: [id number]  AOZ#:[id number]  Patient Care Team: Venita Lick, NP as PCP - General (Nurse Practitioner) Christene Lye, MD (General Surgery) Guadalupe Maple, MD (Family Medicine) Daiva Huge, RN as Oncology Nurse Navigator  CHIEF COMPLAINT: Clinical stage Ia ER/PR positive, HER2 negative invasive carcinoma of the right breast.  INTERVAL HISTORY: Patient is a 71 year old female with a distant history of right breast cancer who was noted to have an abnormality on routine screening mammogram.  Subsequent ultrasound and biopsy revealed the above-stated malignancy.  She currently feels well and is asymptomatic.  She has no neurologic complaints.  She denies any recent fevers or illnesses.  She has a good appetite and denies weight loss.  She has no chest pain, shortness of breath, cough, or hemoptysis.  She denies any nausea, vomiting, constipation, or diarrhea.  She has no urinary complaints.  Patient offers no further specific complaints today.  REVIEW OF SYSTEMS:   Review of Systems  Constitutional: Negative.  Negative for fever, malaise/fatigue and weight loss.  Respiratory: Negative.  Negative for cough and shortness of breath.   Cardiovascular: Negative.  Negative for chest pain and leg swelling.  Gastrointestinal: Negative.  Negative for abdominal pain.  Genitourinary: Negative.  Negative for dysuria.  Musculoskeletal: Negative.  Negative for back pain.  Skin: Negative.  Negative for rash.  Neurological: Negative.  Negative for dizziness, focal weakness, weakness and headaches.  Psychiatric/Behavioral: Negative.  The patient is not nervous/anxious.     As per HPI. Otherwise, a complete review of systems is negative.  PAST MEDICAL HISTORY: Past Medical History:  Diagnosis Date   Breast cancer [facility]) 1997   right breast, radiation   Bronchitis     recent/ 06/09/15 had chest xray/ [facility]/resolved   Cancer [facility]) 1997   right lumpectomy,L/Ad/R    GERD (gastroesophageal reflux disease)    History of hiatal hernia    Hypercholesteremia    Hyperlipidemia    Low BP    TYPICALLY RUNS 80'S/60'S   Panic attack    Personal history of malignant neoplasm of breast     PAST SURGICAL HISTORY: Past Surgical History:  Procedure Laterality Date   BICEPT TENODESIS Left 11/23/2016   Procedure: BICEPS TENODESIS;  Surgeon: Leim Fabry, MD;  Location: [facility] ORS;  Service: Orthopedics;  Laterality: Left;   BREAST EXCISIONAL BIOPSY Right 1997   pos   BREAST SURGERY Right 1997   lumpectomy   CHOLECYSTECTOMY N/A 08/06/2016   Procedure: LAPAROSCOPIC CHOLECYSTECTOMY WITH INTRAOPERATIVE CHOLANGIOGRAM;  Surgeon: Christene Lye, MD;  Location: [facility];  Service: General;  Laterality: N/A;   COLONOSCOPY  2008   COLONOSCOPY WITH PROPOFOL N/A 07/21/2015   Procedure: COLONOSCOPY WITH PROPOFOL;  Surgeon: Lucilla Lame, MD;  Location: [place];  Service: Endoscopy;  Laterality: N/A;   COLONOSCOPY WITH PROPOFOL N/A 01/05/2018   Procedure: COLONOSCOPY WITH PROPOFOL;  Surgeon: Jonathon Bellows, MD;  Location: [facility] ENDOSCOPY;  Service: Gastroenterology;  Laterality: N/A;   ESOPHAGOGASTRODUODENOSCOPY (EGD) WITH PROPOFOL N/A 06/16/2016   Procedure: ESOPHAGOGASTRODUODENOSCOPY (EGD) WITH PROPOFOL;  Surgeon: Christene Lye, MD;  Location: [facility] ENDOSCOPY;  Service: Endoscopy;  Laterality: N/A;   ESOPHAGOGASTRODUODENOSCOPY (EGD) WITH PROPOFOL N/A 01/05/2018   Procedure: ESOPHAGOGASTRODUODENOSCOPY (EGD) WITH PROPOFOL;  Surgeon: Jonathon Bellows, MD;  Location: [facility] ENDOSCOPY;  Service: Gastroenterology;  Laterality: N/A;   ESOPHAGOGASTRODUODENOSCOPY (EGD) WITH PROPOFOL N/A 06/13/2018   Procedure: ESOPHAGOGASTRODUODENOSCOPY (EGD) WITH  PROPOFOL;  Surgeon: Jonathon Bellows, MD;  Location: [facility] ENDOSCOPY;  Service: Gastroenterology;  Laterality: N/A;    ESOPHAGOGASTRODUODENOSCOPY (EGD) WITH PROPOFOL N/A 07/16/2018   Procedure: ESOPHAGOGASTRODUODENOSCOPY (EGD) WITH PROPOFOL;  Surgeon: Lucilla Lame, MD;  Location: [facility] ENDOSCOPY;  Service: Endoscopy;  Laterality: N/A;   HARDWARE REMOVAL Left 12/27/2016   Procedure: HARDWARE REMOVAL LEFT  PROXIMAL HUMEROUS;  Surgeon: Leim Fabry, MD;  Location: [facility];  Service: Orthopedics;  Laterality: Left;   IR GJ TUBE CHANGE  3/32/9518   NISSEN FUNDOPLICATION N/A 8/41/6606   Procedure: NISSEN FUNDOPLICATION;  Surgeon: Jules Husbands, MD;  Location: [facility];  Service: General;  Laterality: N/A;   ORIF HUMERUS FRACTURE Left 11/23/2016   Procedure: OPEN REDUCTION INTERNAL FIXATION (ORIF) PROXIMAL HUMERUS FRACTURE;  Surgeon: Leim Fabry, MD;  Location: [facility];  Service: Orthopedics;  Laterality: Left;   REPAIR OF ESOPHAGUS  03/09/2018   Procedure: REPAIR OF ESOPHAGUS;  Surgeon: Jules Husbands, MD;  Location: [facility];  Service: General;;   REVERSE SHOULDER ARTHROPLASTY Left 12/27/2016   Procedure: REVERSE SHOULDER ARTHROPLASTY;  Surgeon: Leim Fabry, MD;  Location: [facility];  Service: Orthopedics;  Laterality: Left;   ROBOTIC ASSISTED LAPAROSCOPIC REPAIR OF PARAESOPHAGEAL HERNIA N/A 03/09/2018   Procedure: ROBOTIC HIATAL HERNIA CONVERTED TO OPEN;  Surgeon: Jules Husbands, MD;  Location: [facility];  Service: General;  Laterality: N/A;    FAMILY HISTORY: Family History  Problem Relation Age of Onset   Kidney disease Mother    Cancer Father        stomach   Heart attack Father    Hypertension Father    Alcohol abuse Father    Anxiety disorder Brother    Obesity Brother    COPD Brother    Depression Brother    Anxiety disorder Brother    Cancer Maternal Grandmother    Breast cancer Maternal Grandmother     ADVANCED DIRECTIVES (Y/N):  N  HEALTH MAINTENANCE: Social History   Tobacco Use   Smoking status: Former    Packs/day: 0.10    Years: 10.00    Total pack years: 1.00    Types: Cigarettes     Start date: 11/25/2009    Quit date: 10/22/2019    Years since quitting: 2.1   Smokeless tobacco: Never   Tobacco comments:    1 cigarette daily  Vaping Use   Vaping Use: Never used  Substance Use Topics   Alcohol use: No    Alcohol/week: 0.0 standard drinks of alcohol   Drug use: No     Colonoscopy:  PAP:  Bone density:  Lipid panel:  No Known Allergies  Current Outpatient Medications  Medication Sig Dispense Refill   atorvastatin (LIPITOR) 20 MG tablet Take 1 tablet (20 mg total) by mouth daily. 90 tablet 3   buPROPion (WELLBUTRIN XL) 300 MG 24 hr tablet One tab Once a day 30 tablet 3   busPIRone (BUSPAR) 10 MG tablet Take 1 tablet (10 mg total) by mouth 3 (three) times daily. 90 tablet 12   pantoprazole (PROTONIX) 40 MG tablet Take 1 tablet (40 mg total) by mouth daily. 90 tablet 3   trazodone (DESYREL) 300 MG tablet TAKE 1 TABLET(300 MG) BY MOUTH AT BEDTIME 90 tablet 4   docusate sodium (COLACE) 100 MG capsule Take 1 capsule (100 mg total) by mouth 2 (two) times daily. (Patient not taking: Reported on 12/02/2021) 90 capsule 1   gabapentin (NEURONTIN) 600 MG tablet Take 0.5 tablets (300 mg total) by mouth  at bedtime. (Patient not taking: Reported on 12/02/2021) 45 tablet 2   polyethylene glycol (MIRALAX / GLYCOLAX) 17 g packet Take 17 g by mouth daily. (Patient not taking: Reported on 12/02/2021) 30 each 1   triamcinolone cream (KENALOG) 0.1 % Apply 1 Application topically 2 (two) times daily. (Patient not taking: Reported on 12/02/2021) 30 g 0   [START ON 12/09/2021] Zoster Vaccine Adjuvanted [facility]) injection Inject 0.5 mLs into the muscle once for 1 dose. (Patient not taking: Reported on 12/02/2021) 0.5 mL 0   [START ON 02/08/2022] Zoster Vaccine Adjuvanted [facility]) injection Inject 0.5 mLs into the muscle once for 1 dose. (Patient not taking: Reported on 12/02/2021) 0.5 mL 0   No current facility-administered medications for this visit.    OBJECTIVE: Vitals:   12/02/21  1122  BP: (!) 121/95  Pulse: 81  Resp: 16  Temp: 98.7 F (37.1 C)  SpO2: 96%     Body mass index is 31.84 kg/m.    ECOG FS:0 - Asymptomatic  General: Well-developed, well-nourished, no acute distress. Eyes: Pink conjunctiva, anicteric sclera. HEENT: Normocephalic, moist mucous membranes. Breast: Exam deferred today. Lungs: No audible wheezing or coughing. Heart: Regular rate and rhythm. Abdomen: Soft, nontender, no obvious distention. Musculoskeletal: No edema, cyanosis, or clubbing. Neuro: Alert, answering all questions appropriately. Cranial nerves grossly intact. Skin: No rashes or petechiae noted. Psych: Normal affect. Lymphatics: No cervical, calvicular, axillary or inguinal LAD.   LAB RESULTS:  Lab Results  Component Value Date   NA 139 01/22/2021   K 4.7 01/22/2021   CL 101 01/22/2021   CO2 26 01/22/2021   GLUCOSE 104 (H) 01/22/2021   BUN 9 01/22/2021   CREATININE 0.84 01/22/2021   CALCIUM 9.0 01/22/2021   PROT 6.3 01/22/2021   ALBUMIN 3.9 01/22/2021   AST 16 01/22/2021   ALT 10 01/22/2021   ALKPHOS 113 01/22/2021   BILITOT 0.3 01/22/2021   GFRNONAA 81 01/21/2020   GFRAA 93 01/21/2020    Lab Results  Component Value Date   WBC 5.3 01/22/2021   NEUTROABS 3.6 01/22/2021   HGB 13.7 01/22/2021   HCT 41.4 01/22/2021   MCV 92 01/22/2021   PLT 241 01/22/2021     STUDIES: [place] AXILLARY NODE CORE BIOPSY RIGHT  Addendum Date: 11/27/2021   ADDENDUM REPORT: 11/27/2021 13:46 ADDENDUM: PATHOLOGY revealed: Site A. BREAST, RIGHT AT 10:00, 6 CM FROM NIPPLE; ULTRASOUND-GUIDED CORE NEEDLE BIOPSY:- INVASIVE MAMMARY CARCINOMA, NO SPECIAL TYPE, WITH FOCAL FEATURES OF SOLID PSEUDOPAPILLARY CARCINOMA. Size of invasive carcinoma: 9 mm in this sample. Grade 2. Pathology results are CONCORDANT with imaging findings, per Dr. Beryle Flock. PATHOLOGY revealed: Site B. LYMPH NODE, RIGHT AXILLARY; ULTRASOUND-GUIDED CORE NEEDLE BIOPSY:- FRAGMENTS OF BENIGN LYMPH NODE.- NEGATIVE FOR  METASTATIC CARCINOMA. Pathology results are CONCORDANT with imaging findings, per Dr. Beryle Flock. Pathology results and recommendations below were discussed with patient by telephone on 11/27/2021. Patient reported biopsy site within normal limits with slight tenderness at the site. Post biopsy care instructions were reviewed, questions were answered and my direct phone number was provided to patient. Patient was instructed to call [facility] if any concerns or questions arise related to the biopsy. RECOMMENDATIONS: 1. Surgical and oncological consultation. Request for surgical and oncological consultation relayed to Casper Harrison RN at [facility] by Electa Sniff RN on 11/27/2021. 2. Consider bilateral breast MRI with and without contrast to determine extent of disease. Pathology results reported by Electa Sniff RN on 11/27/2021. Electronically Signed   By: Robby Sermon  Konanur M.D.   On: 11/27/2021 13:46   Result Date: 11/27/2021 CLINICAL DATA:  Presenting for biopsy of right breast mass and right axillary lymph node. History of right lumpectomy in 1997. EXAM: ULTRASOUND GUIDED RIGHT BREAST CORE NEEDLE BIOPSY COMPARISON:  Previous exam(s). PROCEDURE: I met with the patient and we discussed the procedure of ultrasound-guided biopsy, including benefits and alternatives. We discussed the high likelihood of a successful procedure. We discussed the risks of the procedure, including infection, bleeding, tissue injury, clip migration, and inadequate sampling. Informed written consent was given. The usual time-out protocol was performed immediately prior to the procedure. SITE 1: Lesion quadrant: Upper outer quadrant Using sterile technique and 1% Lidocaine as local anesthetic, under direct ultrasound visualization, a 14 gauge spring-loaded device was used to perform biopsy of a right breast mass in the 10 o'clock position using a lateral approach. At the conclusion of the procedure ribbon tissue  marker clip was deployed into the biopsy cavity. SITE 2: Lesion quadrant: Upper outer quadrant Using sterile technique and 1% Lidocaine as local anesthetic, under direct ultrasound visualization, a 14 gauge spring-loaded device was used to perform biopsy of a right axillary lymph node using an inferior approach. At the conclusion of the procedure HydroMARK butterfly tissue marker clip was deployed into the biopsy cavity. Follow up 2 view mammogram was performed and dictated separately. IMPRESSION: Ultrasound guided biopsy of a right breast mass in the 10 o'clock position and right axillary lymph node. No apparent complications. Electronically Signed: By: Beryle Flock M.D. On: 11/26/2021 10:58   [place] RT BREAST BX W LOC DEV 1ST LESION IMG BX SPEC US GUIDE  Addendum Date: 11/27/2021   ADDENDUM REPORT: 11/27/2021 13:46 ADDENDUM: PATHOLOGY revealed: Site A. BREAST, RIGHT AT 10:00, 6 CM FROM NIPPLE; ULTRASOUND-GUIDED CORE NEEDLE BIOPSY:- INVASIVE MAMMARY CARCINOMA, NO SPECIAL TYPE, WITH FOCAL FEATURES OF SOLID PSEUDOPAPILLARY CARCINOMA. Size of invasive carcinoma: 9 mm in this sample. Grade 2. Pathology results are CONCORDANT with imaging findings, per Dr. Beryle Flock. PATHOLOGY revealed: Site B. LYMPH NODE, RIGHT AXILLARY; ULTRASOUND-GUIDED CORE NEEDLE BIOPSY:- FRAGMENTS OF BENIGN LYMPH NODE.- NEGATIVE FOR METASTATIC CARCINOMA. Pathology results are CONCORDANT with imaging findings, per Dr. Beryle Flock. Pathology results and recommendations below were discussed with patient by telephone on 11/27/2021. Patient reported biopsy site within normal limits with slight tenderness at the site. Post biopsy care instructions were reviewed, questions were answered and my direct phone number was provided to patient. Patient was instructed to call [facility] if any concerns or questions arise related to the biopsy. RECOMMENDATIONS: 1. Surgical and oncological consultation. Request for surgical and oncological  consultation relayed to Casper Harrison RN at [facility] by Electa Sniff RN on 11/27/2021. 2. Consider bilateral breast MRI with and without contrast to determine extent of disease. Pathology results reported by Electa Sniff RN on 11/27/2021. Electronically Signed   By: Beryle Flock M.D.   On: 11/27/2021 13:46   Result Date: 11/27/2021 CLINICAL DATA:  Presenting for biopsy of right breast mass and right axillary lymph node. History of right lumpectomy in 1997. EXAM: ULTRASOUND GUIDED RIGHT BREAST CORE NEEDLE BIOPSY COMPARISON:  Previous exam(s). PROCEDURE: I met with the patient and we discussed the procedure of ultrasound-guided biopsy, including benefits and alternatives. We discussed the high likelihood of a successful procedure. We discussed the risks of the procedure, including infection, bleeding, tissue injury, clip migration, and inadequate sampling. Informed written consent was given. The usual time-out protocol was performed immediately prior to the  procedure. SITE 1: Lesion quadrant: Upper outer quadrant Using sterile technique and 1% Lidocaine as local anesthetic, under direct ultrasound visualization, a 14 gauge spring-loaded device was used to perform biopsy of a right breast mass in the 10 o'clock position using a lateral approach. At the conclusion of the procedure ribbon tissue marker clip was deployed into the biopsy cavity. SITE 2: Lesion quadrant: Upper outer quadrant Using sterile technique and 1% Lidocaine as local anesthetic, under direct ultrasound visualization, a 14 gauge spring-loaded device was used to perform biopsy of a right axillary lymph node using an inferior approach. At the conclusion of the procedure HydroMARK butterfly tissue marker clip was deployed into the biopsy cavity. Follow up 2 view mammogram was performed and dictated separately. IMPRESSION: Ultrasound guided biopsy of a right breast mass in the 10 o'clock position and right axillary lymph node. No  apparent complications. Electronically Signed: By: Beryle Flock M.D. On: 11/26/2021 10:58   MM CLIP PLACEMENT RIGHT  Result Date: 11/26/2021 CLINICAL DATA:  Postprocedure mammogram EXAM: 3D DIAGNOSTIC RIGHT MAMMOGRAM POST ULTRASOUND BIOPSY COMPARISON:  Previous exam(s). FINDINGS: 3D Mammographic images were obtained following ultrasound guided biopsy of a right breast mass in the 10 o'clock position and right axillary lymph node. The biopsy marking clips are in expected position at the sites of biopsy. IMPRESSION: Appropriate positioning of the ribbon and HydroMARK shaped biopsy marking clips at the sites of biopsy in the right upper outer quadrant and right axilla. Final Assessment: Post Procedure Mammograms for Marker Placement Electronically Signed   By: Beryle Flock M.D.   On: 11/26/2021 11:01  MM DIAG BREAST TOMO UNI RIGHT  Result Date: 11/10/2021 CLINICAL DATA:  Recall from screening to evaluate a possible right breast asymmetry. History of malignant right lumpectomy 1997. EXAM: DIGITAL DIAGNOSTIC UNILATERAL RIGHT MAMMOGRAM WITH TOMOSYNTHESIS; ULTRASOUND RIGHT BREAST LIMITED TECHNIQUE: Right digital diagnostic mammography and breast tomosynthesis was performed.; Targeted ultrasound examination of the right breast was performed COMPARISON:  Previous exam(s). ACR Breast Density Category b: There are scattered areas of fibroglandular density. FINDINGS: Spot compression tomographic images of the right breast were obtained. There is a focal irregular mass along the posterolateral aspect of the lumpectomy site. Targeted ultrasound is performed, showing a hypoechoic mass with irregular shape and margins over the 10 o'clock position of the right breast 6 cm from the nipple adjacent to the lumpectomy site. This mass measures 0.7 x 0.9 x 1.1 cm. Ultrasound the right axilla demonstrates several normal lymph nodes. There is a single more superficial axillary lymph node with cortex measuring 3.4 mm which  is within normal although this appears different from all the other lymph nodes visualized. IMPRESSION: 1.1 cm suspicious mass over the 10 o'clock position of the right breast 6 cm from the nipple. Single borderline abnormal right axillary lymph node. RECOMMENDATION: Recommend ultrasound-guided core needle biopsy of the suspicious right breast mass at the 10 o'clock position in addition to biopsy of the borderline abnormal right axillary lymph node. I have discussed the findings and recommendations with the patient. If applicable, a reminder letter will be sent to the patient regarding the next appointment. BI-RADS CATEGORY  4: Suspicious. Biopsy will be scheduled here at the [facility]. Electronically Signed   By: Marin Olp M.D.   On: 11/10/2021 15:06  US BREAST LTD UNI RIGHT INC AXILLA  Result Date: 11/10/2021 CLINICAL DATA:  Recall from screening to evaluate a possible right breast asymmetry. History of malignant right lumpectomy 1997. EXAM: DIGITAL DIAGNOSTIC UNILATERAL  RIGHT MAMMOGRAM WITH TOMOSYNTHESIS; ULTRASOUND RIGHT BREAST LIMITED TECHNIQUE: Right digital diagnostic mammography and breast tomosynthesis was performed.; Targeted ultrasound examination of the right breast was performed COMPARISON:  Previous exam(s). ACR Breast Density Category b: There are scattered areas of fibroglandular density. FINDINGS: Spot compression tomographic images of the right breast were obtained. There is a focal irregular mass along the posterolateral aspect of the lumpectomy site. Targeted ultrasound is performed, showing a hypoechoic mass with irregular shape and margins over the 10 o'clock position of the right breast 6 cm from the nipple adjacent to the lumpectomy site. This mass measures 0.7 x 0.9 x 1.1 cm. Ultrasound the right axilla demonstrates several normal lymph nodes. There is a single more superficial axillary lymph node with cortex measuring 3.4 mm which is within normal although this appears  different from all the other lymph nodes visualized. IMPRESSION: 1.1 cm suspicious mass over the 10 o'clock position of the right breast 6 cm from the nipple. Single borderline abnormal right axillary lymph node. RECOMMENDATION: Recommend ultrasound-guided core needle biopsy of the suspicious right breast mass at the 10 o'clock position in addition to biopsy of the borderline abnormal right axillary lymph node. I have discussed the findings and recommendations with the patient. If applicable, a reminder letter will be sent to the patient regarding the next appointment. BI-RADS CATEGORY  4: Suspicious. Biopsy will be scheduled here at the [facility]. Electronically Signed   By: Marin Olp M.D.   On: 11/10/2021 15:06   ASSESSMENT: Clinical stage Ia ER/PR positive, HER2 negative invasive carcinoma of the right breast.  PLAN:    Clinical stage Ia ER/PR positive, HER2 negative invasive carcinoma of the right breast: Patient has a distant history of right breast cancer in 1997.  She reports she underwent lumpectomy and XRT at that time.  She did not have chemotherapy or hormonal therapy.  Patient has consultation with surgery later on today.  Radiation oncology has indicated that despite her history of XRT to that breast, partial breast radiation is still possible.  Unclear if patient will require chemotherapy and will send Oncotype Dx score.  Finally, patient will benefit from letrozole for a total of 5 years at the completion of all her treatments.  Return to clinic 2 weeks after her surgery to discuss her final pathology results and additional treatment planning.  I spent a total of 60 minutes reviewing chart data, face-to-face evaluation with the patient, counseling and coordination of care as detailed above.   Patient expressed understanding and was in agreement with this plan. She also understands that She can call clinic at any time with any questions, concerns, or complaints.    Cancer  Staging  Invasive ductal carcinoma of right breast in female [facility]) Staging form: Breast, AJCC 8th Edition - Clinical stage from 12/02/2021: Stage IA (cT1c, cN0, cM0, G2, ER+, PR+, HER2-) - Signed by Lloyd Huger, MD on 12/02/2021 Stage prefix: Initial diagnosis Histologic grading system: 3 grade system   Lloyd Huger, MD   12/02/2021 12:35 PM

## 2021-12-02 NOTE — Progress Notes (Signed)
Accompanied patient at initial medical oncology appointment.   Reviewed Breast Cancer treatment handbook.   Care plan summary given to patient.   Reviewed outreach programs and cancer center services.  

## 2021-12-02 NOTE — H&P (View-Only) (Signed)
Subjective:   CC: Malignant neoplasm of upper-outer quadrant of right female breast, unspecified estrogen receptor status (CMS-HCC) [C50.411] HPI:  Chelsey [name] is a 71 y.o. female who was referred by Norman Herrlich, * for evaluation of above. Change was noted on last screening mammogram. Personal history of partial mastectomy on same side many years ago, with radiation therapy, unsure if sentinel lymph node biopsy attempted at that time.   Past Medical History:  has a past medical history of Breast cancer (CMS-HCC), Bronchitis, GERD (gastroesophageal reflux disease), History of lumpectomy of right breast, Hypercholesteremia, Hyperlipidemia, and Panic attack.  Past Surgical History:  has a past surgical history that includes Cholecystectomy and Breast excisional biopsy (1997).  Family History: family history includes Alcohol abuse in her father; Anxiety in her brother; Breast cancer in her maternal grandmother; COPD in her brother; Depression in her brother; High blood pressure (Hypertension) in her father; Kidney disease in her mother; Obesity in her brother.  Social History:  reports that she has been smoking cigarettes. She has a 11.25 pack-year smoking history. She has never used smokeless tobacco. She reports that she does not drink alcohol and does not use drugs.  Current Medications: has a current medication list which includes the following prescription(s): atorvastatin, bupropion, buspirone, pantoprazole, trazodone, and lidocaine-prilocaine.  Allergies:  Allergies as of 12/02/2021   (No Known Allergies)    ROS:  A 15 point review of systems was performed and was negative except as noted in HPI   Objective:     BP (!) 128/91   Pulse 110   Ht 156.2 cm (5' 1.5")   Wt 87.5 kg (193 lb)   BMI 35.88 kg/m   Constitutional :  No distress, cooperative, alert  Lymphatics/Throat:  Supple with no lymphadenopathy  Respiratory:  Clear to auscultation bilaterally   Cardiovascular:  Regular rate and rhythm  Gastrointestinal: Soft, non-tender, non-distended, no organomegaly.  Musculoskeletal: Steady gait and movement  Skin: Cool and moist  Psychiatric: Normal affect, non-agitated, not confused  Breast: Normal appearance and no palpable abnormality in left breast and axilla.  Right breast with obvious smaller size, changes from former partial masectomy.  Bruising noted from recent biopsy, likely palpable tumor as well.Chaperone present for exam.      LABS:  CLINICAL DATA:  Recall from screening to evaluate a possible right  breast asymmetry. History of malignant right lumpectomy 1997.   EXAM:  DIGITAL DIAGNOSTIC UNILATERAL RIGHT MAMMOGRAM WITH TOMOSYNTHESIS;  ULTRASOUND RIGHT BREAST LIMITED   TECHNIQUE:  Right digital diagnostic mammography and breast tomosynthesis was  performed.; Targeted ultrasound examination of the right breast was  performed   COMPARISON: Previous exam(s).   ACR Breast Density Category b: There are scattered areas of  fibroglandular density.   FINDINGS:  Spot compression tomographic images of the right breast were  obtained. There is a focal irregular mass along the posterolateral  aspect of the lumpectomy site.   Targeted ultrasound is performed, showing a hypoechoic mass with  irregular shape and margins over the 10 o'clock position of the  right breast 6 cm from the nipple adjacent to the lumpectomy site.  This mass measures 0.7 x 0.9 x 1.1 cm.   Ultrasound the right axilla demonstrates several normal lymph nodes.  There is a single more superficial axillary lymph node with cortex  measuring 3.4 mm which is within normal although this appears  different from all the other lymph nodes visualized.   IMPRESSION:  1.1 cm suspicious mass over the  10 o'clock position of the right  breast 6 cm from the nipple. Single borderline abnormal right  axillary lymph node.   RECOMMENDATION:  Recommend ultrasound-guided  core needle biopsy of the suspicious  right breast mass at the 10 o'clock position in addition to biopsy  of the borderline abnormal right axillary lymph node.   I have discussed the findings and recommendations with the patient.  If applicable, a reminder letter will be sent to the patient  regarding the next appointment.   BI-RADS CATEGORY  4: Suspicious.   Biopsy will be scheduled here at the [facility].    Electronically Signed    By: Marin Olp M.D.    On: 11/10/2021 15:06    RADS: Addendum by Beryle Flock, MD on 11/27/2021  3:46 PM EDT  ADDENDUM REPORT: 11/27/2021 13:46   ADDENDUM:  PATHOLOGY revealed: Site A. BREAST, RIGHT AT 10:00, 6 CM FROM  NIPPLE; ULTRASOUND-GUIDED CORE NEEDLE BIOPSY:- INVASIVE MAMMARY  CARCINOMA, NO SPECIAL TYPE, WITH FOCAL FEATURES OF SOLID  PSEUDOPAPILLARY CARCINOMA. Size of invasive carcinoma: 9 mm in this  sample. Grade 2.   Pathology results are CONCORDANT with imaging findings, per Dr.  Beryle Flock.   PATHOLOGY revealed: Site B. LYMPH NODE, RIGHT AXILLARY;  ULTRASOUND-GUIDED CORE NEEDLE BIOPSY:- FRAGMENTS OF BENIGN LYMPH  NODE.- NEGATIVE FOR METASTATIC CARCINOMA.   Pathology results are CONCORDANT with imaging findings, per Dr.  Beryle Flock.   Pathology results and recommendations below were discussed with  patient by telephone on 11/27/2021. Patient reported biopsy site  within normal limits with slight tenderness at the site. Post biopsy  care instructions were reviewed, questions were answered and my  direct phone number was provided to patient. Patient was instructed  to call [facility] if any concerns or questions arise  related to the biopsy.   RECOMMENDATIONS:   1. Surgical and oncological consultation. Request for surgical and  oncological consultation relayed to Casper Harrison RN at [facility] by Electa Sniff RN on 11/27/2021.   2. Consider bilateral breast MRI with and  without contrast to  determine extent of disease.   Pathology results reported by Electa Sniff RN on 11/27/2021.    Assessment:   Malignant neoplasm of upper-outer quadrant of right female breast, unspecified estrogen receptor status (CMS-HCC) [C50.411]  Plan:     1. Malignant neoplasm of upper-outer quadrant of right female breast, unspecified estrogen receptor status (CMS-HCC) [C50.411]  Pt states she is not concerned with cosmetic outcome after lumpectomy, and wishes to proceed with partial mastectomy again to avoid prolonged recovery from a mastectomy, understanding radiation therapy likely will be needed after a partial.   Discussed the risk of surgery including recurrence, chronic pain, post-op infxn, poor/delayed wound healing, poor cosmesis, seroma, hematoma formation, and possible re-operation to address said risks. The risks of general anesthetic, if used, includes MI, CVA, sudden death or even reaction to anesthetic medications also discussed.  Typical post-op recovery time and possbility of activity restrictions were also discussed.  Alternatives include continued observation.  Benefits include possible symptom relief, pathologic evaluation, and/or curative excision.   The patient verbalized understanding and all questions were answered to the patient's satisfaction.  2. Patient has elected to proceed with surgical treatment. Procedure will be scheduled.  Right partial mastectomy, attempt sentinel lymph node biopsy. RF placement prior.  labs/images/medications/previous chart entries reviewed personally and relevant changes/updates noted above.

## 2021-12-02 NOTE — H&P (Signed)
Subjective:   CC: Malignant neoplasm of upper-outer quadrant of right female breast, unspecified estrogen receptor status (CMS-HCC) [C50.411] HPI:  Chelsey [name] is a 71 y.o. female who was referred by Norman Herrlich, * for evaluation of above. Change was noted on last screening mammogram. Personal history of partial mastectomy on same side many years ago, with radiation therapy, unsure if sentinel lymph node biopsy attempted at that time.   Past Medical History:  has a past medical history of Breast cancer (CMS-HCC), Bronchitis, GERD (gastroesophageal reflux disease), History of lumpectomy of right breast, Hypercholesteremia, Hyperlipidemia, and Panic attack.  Past Surgical History:  has a past surgical history that includes Cholecystectomy and Breast excisional biopsy (1997).  Family History: family history includes Alcohol abuse in her father; Anxiety in her brother; Breast cancer in her maternal grandmother; COPD in her brother; Depression in her brother; High blood pressure (Hypertension) in her father; Kidney disease in her mother; Obesity in her brother.  Social History:  reports that she has been smoking cigarettes. She has a 11.25 pack-year smoking history. She has never used smokeless tobacco. She reports that she does not drink alcohol and does not use drugs.  Current Medications: has a current medication list which includes the following prescription(s): atorvastatin, bupropion, buspirone, pantoprazole, trazodone, and lidocaine-prilocaine.  Allergies:  Allergies as of 12/02/2021   (No Known Allergies)    ROS:  A 15 point review of systems was performed and was negative except as noted in HPI   Objective:     BP (!) 128/91   Pulse 110   Ht 156.2 cm (5' 1.5")   Wt 87.5 kg (193 lb)   BMI 35.88 kg/m   Constitutional :  No distress, cooperative, alert  Lymphatics/Throat:  Supple with no lymphadenopathy  Respiratory:  Clear to auscultation bilaterally   Cardiovascular:  Regular rate and rhythm  Gastrointestinal: Soft, non-tender, non-distended, no organomegaly.  Musculoskeletal: Steady gait and movement  Skin: Cool and moist  Psychiatric: Normal affect, non-agitated, not confused  Breast: Normal appearance and no palpable abnormality in left breast and axilla.  Right breast with obvious smaller size, changes from former partial masectomy.  Bruising noted from recent biopsy, likely palpable tumor as well.Chaperone present for exam.      LABS:  CLINICAL DATA:  Recall from screening to evaluate a possible right  breast asymmetry. History of malignant right lumpectomy 1997.   EXAM:  DIGITAL DIAGNOSTIC UNILATERAL RIGHT MAMMOGRAM WITH TOMOSYNTHESIS;  ULTRASOUND RIGHT BREAST LIMITED   TECHNIQUE:  Right digital diagnostic mammography and breast tomosynthesis was  performed.; Targeted ultrasound examination of the right breast was  performed   COMPARISON: Previous exam(s).   ACR Breast Density Category b: There are scattered areas of  fibroglandular density.   FINDINGS:  Spot compression tomographic images of the right breast were  obtained. There is a focal irregular mass along the posterolateral  aspect of the lumpectomy site.   Targeted ultrasound is performed, showing a hypoechoic mass with  irregular shape and margins over the 10 o'clock position of the  right breast 6 cm from the nipple adjacent to the lumpectomy site.  This mass measures 0.7 x 0.9 x 1.1 cm.   Ultrasound the right axilla demonstrates several normal lymph nodes.  There is a single more superficial axillary lymph node with cortex  measuring 3.4 mm which is within normal although this appears  different from all the other lymph nodes visualized.   IMPRESSION:  1.1 cm suspicious mass over the  10 o'clock position of the right  breast 6 cm from the nipple. Single borderline abnormal right  axillary lymph node.   RECOMMENDATION:  Recommend ultrasound-guided  core needle biopsy of the suspicious  right breast mass at the 10 o'clock position in addition to biopsy  of the borderline abnormal right axillary lymph node.   I have discussed the findings and recommendations with the patient.  If applicable, a reminder letter will be sent to the patient  regarding the next appointment.   BI-RADS CATEGORY  4: Suspicious.   Biopsy will be scheduled here at the [facility].    Electronically Signed    By: Marin Olp M.D.    On: 11/10/2021 15:06    RADS: Addendum by Beryle Flock, MD on 11/27/2021  3:46 PM EDT  ADDENDUM REPORT: 11/27/2021 13:46   ADDENDUM:  PATHOLOGY revealed: Site A. BREAST, RIGHT AT 10:00, 6 CM FROM  NIPPLE; ULTRASOUND-GUIDED CORE NEEDLE BIOPSY:- INVASIVE MAMMARY  CARCINOMA, NO SPECIAL TYPE, WITH FOCAL FEATURES OF SOLID  PSEUDOPAPILLARY CARCINOMA. Size of invasive carcinoma: 9 mm in this  sample. Grade 2.   Pathology results are CONCORDANT with imaging findings, per Dr.  Beryle Flock.   PATHOLOGY revealed: Site B. LYMPH NODE, RIGHT AXILLARY;  ULTRASOUND-GUIDED CORE NEEDLE BIOPSY:- FRAGMENTS OF BENIGN LYMPH  NODE.- NEGATIVE FOR METASTATIC CARCINOMA.   Pathology results are CONCORDANT with imaging findings, per Dr.  Beryle Flock.   Pathology results and recommendations below were discussed with  patient by telephone on 11/27/2021. Patient reported biopsy site  within normal limits with slight tenderness at the site. Post biopsy  care instructions were reviewed, questions were answered and my  direct phone number was provided to patient. Patient was instructed  to call [facility] if any concerns or questions arise  related to the biopsy.   RECOMMENDATIONS:   1. Surgical and oncological consultation. Request for surgical and  oncological consultation relayed to Casper Harrison RN at [facility] by Electa Sniff RN on 11/27/2021.   2. Consider bilateral breast MRI with and  without contrast to  determine extent of disease.   Pathology results reported by Electa Sniff RN on 11/27/2021.    Assessment:   Malignant neoplasm of upper-outer quadrant of right female breast, unspecified estrogen receptor status (CMS-HCC) [C50.411]  Plan:     1. Malignant neoplasm of upper-outer quadrant of right female breast, unspecified estrogen receptor status (CMS-HCC) [C50.411]  Pt states she is not concerned with cosmetic outcome after lumpectomy, and wishes to proceed with partial mastectomy again to avoid prolonged recovery from a mastectomy, understanding radiation therapy likely will be needed after a partial.   Discussed the risk of surgery including recurrence, chronic pain, post-op infxn, poor/delayed wound healing, poor cosmesis, seroma, hematoma formation, and possible re-operation to address said risks. The risks of general anesthetic, if used, includes MI, CVA, sudden death or even reaction to anesthetic medications also discussed.  Typical post-op recovery time and possbility of activity restrictions were also discussed.  Alternatives include continued observation.  Benefits include possible symptom relief, pathologic evaluation, and/or curative excision.   The patient verbalized understanding and all questions were answered to the patient's satisfaction.  2. Patient has elected to proceed with surgical treatment. Procedure will be scheduled.  Right partial mastectomy, attempt sentinel lymph node biopsy. RF placement prior.  labs/images/medications/previous chart entries reviewed personally and relevant changes/updates noted above.

## 2021-12-09 ENCOUNTER — Other Ambulatory Visit: Payer: Self-pay | Admitting: Surgery

## 2021-12-09 DIAGNOSIS — C50411 Malignant neoplasm of upper-outer quadrant of right female breast: Secondary | ICD-10-CM

## 2021-12-10 ENCOUNTER — Encounter: Payer: Self-pay | Admitting: *Deleted

## 2021-12-10 ENCOUNTER — Other Ambulatory Visit: Payer: Self-pay | Admitting: Surgery

## 2021-12-10 DIAGNOSIS — C50411 Malignant neoplasm of upper-outer quadrant of right female breast: Secondary | ICD-10-CM

## 2021-12-10 DIAGNOSIS — C50919 Malignant neoplasm of unspecified site of unspecified female breast: Secondary | ICD-10-CM

## 2021-12-10 NOTE — Progress Notes (Signed)
Lumpectomy scheduled for 12/1.   Appt. Scheduled for Dr. Grayland Ormond and Dr. Baruch Gouty on 12/19.   Details given to the patient.

## 2021-12-21 ENCOUNTER — Ambulatory Visit
Admission: RE | Admit: 2021-12-21 | Discharge: 2021-12-21 | Disposition: A | Discharge status: Home / Self Care | Payer: Medicare Other | Source: Ambulatory Visit | Attending: Surgery | Admitting: Surgery

## 2021-12-21 DIAGNOSIS — C50911 Malignant neoplasm of unspecified site of right female breast: Secondary | ICD-10-CM | POA: Diagnosis not present

## 2021-12-21 DIAGNOSIS — C50411 Malignant neoplasm of upper-outer quadrant of right female breast: Secondary | ICD-10-CM | POA: Diagnosis not present

## 2021-12-21 HISTORY — PX: BREAST BIOPSY: SHX20

## 2021-12-22 MED ORDER — LIDOCAINE HCL (PF) 1 % IJ SOLN: 10.0000 mL | Freq: Once | INTRAMUSCULAR | Status: AC

## 2021-12-23 ENCOUNTER — Encounter
Admission: RE | Admit: 2021-12-23 | Discharge: 2021-12-23 | Disposition: A | Discharge status: Home / Self Care | Payer: Medicare Other | Source: Ambulatory Visit | Attending: Surgery | Admitting: Surgery

## 2021-12-23 ENCOUNTER — Encounter: Payer: Self-pay | Admitting: Urgent Care

## 2021-12-23 VITALS — Ht 65.25 in | Wt 190.0 lb

## 2021-12-23 DIAGNOSIS — R9431 Abnormal electrocardiogram [ECG] [EKG]: Secondary | ICD-10-CM | POA: Insufficient documentation

## 2021-12-23 DIAGNOSIS — I700 Atherosclerosis of aorta: Secondary | ICD-10-CM | POA: Insufficient documentation

## 2021-12-23 DIAGNOSIS — E7849 Other hyperlipidemia: Secondary | ICD-10-CM | POA: Insufficient documentation

## 2021-12-23 DIAGNOSIS — Z01810 Encounter for preprocedural cardiovascular examination: Secondary | ICD-10-CM | POA: Diagnosis not present

## 2021-12-23 DIAGNOSIS — Z01812 Encounter for preprocedural laboratory examination: Secondary | ICD-10-CM

## 2021-12-23 DIAGNOSIS — Z01818 Encounter for other preprocedural examination: Secondary | ICD-10-CM | POA: Insufficient documentation

## 2021-12-23 HISTORY — DX: Diaphragmatic hernia without obstruction or gangrene: K44.9

## 2021-12-23 HISTORY — DX: Atherosclerosis of aorta: I70.0

## 2021-12-23 HISTORY — DX: Other specified disorders of bone density and structure, unspecified site: M85.80

## 2021-12-23 LAB — BASIC METABOLIC PANEL
Anion gap: 6 (ref 5–15)
BUN: 14 mg/dL (ref 8–23)
CO2: 25 mmol/L (ref 22–32)
Calcium: 8.7 mg/dL — ABNORMAL LOW (ref 8.9–10.3)
Chloride: 109 mmol/L (ref 98–111)
Creatinine, Ser: 0.74 mg/dL (ref 0.44–1.00)
GFR, Estimated: >60 mL/min (ref >60)
Glucose, Bld: 100 mg/dL — ABNORMAL HIGH (ref 70–99)
Potassium: 4.4 mmol/L (ref 3.5–5.1)
Sodium: 140 mmol/L (ref 135–145)

## 2021-12-23 LAB — CBC
HCT: 42.4 % (ref 36.0–46.0)
Hemoglobin: 14 g/dL (ref 12.0–15.0)
MCH: 30.6 pg (ref 26.0–34.0)
MCHC: 33 g/dL (ref 30.0–36.0)
MCV: 92.8 fL (ref 80.0–100.0)
Platelets: 220 10*3/uL (ref 150–400)
RBC: 4.57 MIL/uL (ref 3.87–5.11)
RDW: 12.8 % (ref 11.5–15.5)
WBC: 4.8 10*3/uL (ref 4.0–10.5)
nRBC: 0 % (ref 0.0–0.2)

## 2021-12-23 NOTE — Patient Instructions (Addendum)
Your procedure is scheduled on: Friday, December 1 Report to the Registration Desk on the 1st floor of the [place] at the time you were given to go to nuclear medicine dept.  REMEMBER: Instructions that are not followed completely may result in serious medical risk, up to and including death; or upon the discretion of your surgeon and anesthesiologist your surgery may need to be rescheduled.  Do not eat food after midnight the night before surgery.  No gum chewing, lozengers or hard candies.  You may however, drink CLEAR liquids up to 2 hours before you are scheduled to arrive for your surgery. Do not drink anything within 2 hours of your scheduled arrival time.  Clear liquids include: - water  - apple juice without pulp - gatorade (not RED colors) - black coffee or tea (Do NOT add milk or creamers to the coffee or tea) Do NOT drink anything that is not on this list.  TAKE THESE MEDICATIONS THE MORNING OF SURGERY WITH A SIP OF WATER:  Atorvastatin (lipitor) Bupropion (wellbutrin) Buspirone Pantoprazole (protonix)- (take one the night before and one on the morning of surgery - helps to prevent nausea after surgery.)  One week prior to surgery: starting today, November 29 Stop Anti-inflammatories (NSAIDS) such as Advil, Aleve, Ibuprofen, Motrin, Naproxen, Naprosyn and Aspirin based products such as Excedrin, Goodys Powder, BC Powder. Stop ANY OVER THE COUNTER supplements until after surgery. You may however, continue to take Tylenol if needed for pain up until the day of surgery.  No Alcohol for 24 hours before or after surgery.  No Smoking including e-cigarettes for 24 hours prior to surgery.  No chewable tobacco products for at least 6 hours prior to surgery.  No nicotine patches on the day of surgery.  Do not use any "recreational" drugs for at least a week prior to your surgery.  Please be advised that the combination of cocaine and anesthesia may have negative outcomes,  up to and including death. If you test positive for cocaine, your surgery will be cancelled.  On the morning of surgery brush your teeth with toothpaste and water, you may rinse your mouth with mouthwash if you wish. Do not swallow any toothpaste or mouthwash.  Use CHG Soap as directed on instruction sheet.  Do not wear jewelry, make-up, hairpins, clips or nail polish.  Do not wear lotions, powders, or perfumes.   Do not shave body from the neck down 48 hours prior to surgery just in case you cut yourself which could leave a site for infection.  Also, freshly shaved skin may become irritated if using the CHG soap.  Contact lenses, hearing aids and dentures may not be worn into surgery.  Do not bring valuables to the hospital. [facility] is not responsible for any missing/lost belongings or valuables.   Notify your doctor if there is any change in your medical condition (cold, fever, infection).  Wear comfortable clothing (specific to your surgery type) to the hospital.  After surgery, you can help prevent lung complications by doing breathing exercises.  Take deep breaths and cough every 1-2 hours. Your doctor may order a device called an Incentive Spirometer to help you take deep breaths.  If you are being discharged the day of surgery, you will not be allowed to drive home. You will need a responsible adult (18 years or older) to drive you home and stay with you that night.   If you are taking public transportation, you will need to have  a responsible adult (18 years or older) with you. Please confirm with your physician that it is acceptable to use public transportation.   Please call the [place] [facility] at [phone] if you have any questions about these instructions.  Surgery Visitation Policy:  Patients undergoing a surgery or procedure may have two family members or support persons with them as long as the person is not COVID-19 positive or experiencing  its symptoms.      Preparing for Surgery with CHLORHEXIDINE GLUCONATE (CHG) Soap  Chlorhexidine Gluconate (CHG) Soap  o An antiseptic cleaner that kills germs and bonds with the skin to continue killing germs even after washing  o Used for showering the night before surgery and morning of surgery  Before surgery, you can play an important role by reducing the number of germs on your skin.  CHG (Chlorhexidine gluconate) soap is an antiseptic cleanser which kills germs and bonds with the skin to continue killing germs even after washing.  Please do not use if you have an allergy to CHG or antibacterial soaps. If your skin becomes reddened/irritated stop using the CHG.  1. Shower the NIGHT BEFORE SURGERY and the MORNING OF SURGERY with CHG soap.  2. If you choose to wash your hair, wash your hair first as usual with your normal shampoo.  3. After shampooing, rinse your hair and body thoroughly to remove the shampoo.  4. Use CHG as you would any other liquid soap. You can apply CHG directly to the skin and wash gently with a scrungie or a clean washcloth.  5. Apply the CHG soap to your body only from the neck down. Do not use on open wounds or open sores. Avoid contact with your eyes, ears, mouth, and genitals (private parts). Wash face and genitals (private parts) with your normal soap.  6. Wash thoroughly, paying special attention to the area where your surgery will be performed.  7. Thoroughly rinse your body with warm water.  8. Do not shower/wash with your normal soap after using and rinsing off the CHG soap.  9. Pat yourself dry with a clean towel.  10. Wear clean pajamas to bed the night before surgery.  12. Place clean sheets on your bed the night of your first shower and do not sleep with pets.  13. Shower again with the CHG soap on the day of surgery prior to arriving at the hospital.  14. Do not apply any deodorants/lotions/powders.  15. Please wear clean clothes to  the hospital.

## 2021-12-25 ENCOUNTER — Other Ambulatory Visit: Payer: Self-pay

## 2021-12-25 ENCOUNTER — Encounter
Admission: RE | Admit: 2021-12-25 | Discharge: 2021-12-25 | Disposition: A | Discharge status: Home / Self Care | Payer: Medicare Other | Source: Ambulatory Visit | Attending: Surgery | Admitting: Surgery

## 2021-12-25 ENCOUNTER — Ambulatory Visit
Admission: RE | Admit: 2021-12-25 | Discharge: 2021-12-25 | Disposition: A | Discharge status: Home / Self Care | Payer: Medicare Other | Source: Ambulatory Visit | Attending: Surgery | Admitting: Surgery

## 2021-12-25 ENCOUNTER — Encounter
Admission: RE | Disposition: A | Discharge status: Home / Self Care | Payer: Self-pay | Source: Ambulatory Visit | Attending: Surgery

## 2021-12-25 ENCOUNTER — Ambulatory Visit: Payer: Medicare Other | Admitting: Certified Registered"

## 2021-12-25 ENCOUNTER — Encounter: Payer: Self-pay | Admitting: Surgery

## 2021-12-25 DIAGNOSIS — C50411 Malignant neoplasm of upper-outer quadrant of right female breast: Secondary | ICD-10-CM | POA: Insufficient documentation

## 2021-12-25 DIAGNOSIS — R928 Other abnormal and inconclusive findings on diagnostic imaging of breast: Secondary | ICD-10-CM | POA: Diagnosis not present

## 2021-12-25 DIAGNOSIS — F17210 Nicotine dependence, cigarettes, uncomplicated: Secondary | ICD-10-CM | POA: Diagnosis not present

## 2021-12-25 DIAGNOSIS — C50911 Malignant neoplasm of unspecified site of right female breast: Secondary | ICD-10-CM

## 2021-12-25 DIAGNOSIS — K219 Gastro-esophageal reflux disease without esophagitis: Secondary | ICD-10-CM | POA: Diagnosis not present

## 2021-12-25 DIAGNOSIS — E785 Hyperlipidemia, unspecified: Secondary | ICD-10-CM | POA: Diagnosis not present

## 2021-12-25 HISTORY — PX: PART MASTECTOMY,RADIO FREQUENCY LOCALIZER,AXILLARY SENTINEL NODE BIOPSY: SHX6901

## 2021-12-25 HISTORY — PX: BREAST LUMPECTOMY: SHX2

## 2021-12-25 SURGERY — PART MASTECTOMY,RADIO FREQUENCY LOCALIZER,AXILLARY SENTINEL NODE BIOPSY
Anesthesia: General | Laterality: Right

## 2021-12-25 MED ORDER — ONDANSETRON HCL 4 MG/2ML IJ SOLN
INTRAMUSCULAR | Status: DC | PRN
  Administered 2021-12-25: 4 mg via INTRAVENOUS

## 2021-12-25 MED ORDER — ONDANSETRON HCL 4 MG/2ML IJ SOLN
INTRAMUSCULAR | Status: AC
  Filled 2021-12-25: qty 2

## 2021-12-25 MED ORDER — LIDOCAINE HCL (CARDIAC) PF 100 MG/5ML IV SOSY
PREFILLED_SYRINGE | INTRAVENOUS | Status: DC | PRN
  Administered 2021-12-25: 80 mg via INTRAVENOUS

## 2021-12-25 MED ORDER — CHLORHEXIDINE GLUCONATE 0.12 % MT SOLN
OROMUCOSAL | Status: AC
  Administered 2021-12-25: 15 mL via OROMUCOSAL
  Filled 2021-12-25: qty 15

## 2021-12-25 MED ORDER — PHENYLEPHRINE HCL (PRESSORS) 10 MG/ML IV SOLN
INTRAVENOUS | Status: DC | PRN
  Administered 2021-12-25 (×3): 160 ug via INTRAVENOUS
  Administered 2021-12-25: 80 ug via INTRAVENOUS
  Administered 2021-12-25: 160 ug via INTRAVENOUS

## 2021-12-25 MED ORDER — DEXAMETHASONE SODIUM PHOSPHATE 10 MG/ML IJ SOLN
INTRAMUSCULAR | Status: DC | PRN
  Administered 2021-12-25: 5 mg via INTRAVENOUS

## 2021-12-25 MED ORDER — EPHEDRINE 5 MG/ML INJ
INTRAVENOUS | Status: AC
  Filled 2021-12-25: qty 5

## 2021-12-25 MED ORDER — CELECOXIB 200 MG PO CAPS
ORAL_CAPSULE | ORAL | Status: AC
  Administered 2021-12-25: 200 mg via ORAL
  Filled 2021-12-25: qty 1

## 2021-12-25 MED ORDER — HYDROMORPHONE HCL 1 MG/ML IJ SOLN
INTRAMUSCULAR | Status: AC
  Administered 2021-12-25: 0.25 mg via INTRAVENOUS
  Filled 2021-12-25: qty 1

## 2021-12-25 MED ORDER — CEFAZOLIN SODIUM-DEXTROSE 2-4 GM/100ML-% IV SOLN
INTRAVENOUS | Status: AC
  Filled 2021-12-25: qty 100

## 2021-12-25 MED ORDER — PHENYLEPHRINE HCL-NACL 20-0.9 MG/250ML-% IV SOLN
INTRAVENOUS | Status: DC | PRN
  Administered 2021-12-25: 50 ug/min via INTRAVENOUS

## 2021-12-25 MED ORDER — ACETAMINOPHEN 325 MG PO TABS: 650.0000 mg | ORAL_TABLET | Freq: Three times a day (TID) | ORAL | 0 refills | Status: AC | PRN

## 2021-12-25 MED ORDER — CEFAZOLIN SODIUM-DEXTROSE 2-4 GM/100ML-% IV SOLN
2.0000 g | INTRAVENOUS | Status: AC
  Administered 2021-12-25: 2 g via INTRAVENOUS

## 2021-12-25 MED ORDER — CHLORHEXIDINE GLUCONATE CLOTH 2 % EX PADS: 6.0000 | MEDICATED_PAD | Freq: Once | CUTANEOUS | Status: DC

## 2021-12-25 MED ORDER — FENTANYL CITRATE (PF) 100 MCG/2ML IJ SOLN
INTRAMUSCULAR | Status: AC
  Filled 2021-12-25: qty 2

## 2021-12-25 MED ORDER — FENTANYL CITRATE (PF) 100 MCG/2ML IJ SOLN
INTRAMUSCULAR | Status: DC | PRN
  Administered 2021-12-25 (×5): 25 ug via INTRAVENOUS

## 2021-12-25 MED ORDER — BUPIVACAINE-EPINEPHRINE (PF) 0.5% -1:200000 IJ SOLN
INTRAMUSCULAR | Status: AC
  Filled 2021-12-25: qty 30

## 2021-12-25 MED ORDER — DEXMEDETOMIDINE HCL IN NACL 80 MCG/20ML IV SOLN
INTRAVENOUS | Status: DC | PRN
  Administered 2021-12-25: 8 ug via BUCCAL

## 2021-12-25 MED ORDER — SODIUM CHLORIDE (PF) 0.9 % IJ SOLN
INTRAVENOUS | Status: DC | PRN
  Administered 2021-12-25: 20 mL via INTRAMUSCULAR

## 2021-12-25 MED ORDER — HYDROMORPHONE HCL 1 MG/ML IJ SOLN
0.2500 mg | INTRAMUSCULAR | Status: DC | PRN
  Administered 2021-12-25: 0.25 mg via INTRAVENOUS

## 2021-12-25 MED ORDER — LIDOCAINE HCL 1 % IJ SOLN
INTRAMUSCULAR | Status: DC | PRN
  Administered 2021-12-25: 6 mL

## 2021-12-25 MED ORDER — LACTATED RINGERS IV SOLN: INTRAVENOUS | Status: DC

## 2021-12-25 MED ORDER — PROPOFOL 10 MG/ML IV BOLUS
INTRAVENOUS | Status: AC
  Filled 2021-12-25: qty 20

## 2021-12-25 MED ORDER — LIDOCAINE HCL (PF) 2 % IJ SOLN
INTRAMUSCULAR | Status: AC
  Filled 2021-12-25: qty 5

## 2021-12-25 MED ORDER — METHYLENE BLUE 1 % INJ SOLN
INTRAVENOUS | Status: AC
  Filled 2021-12-25: qty 10

## 2021-12-25 MED ORDER — MIDAZOLAM HCL 2 MG/2ML IJ SOLN
INTRAMUSCULAR | Status: AC
  Filled 2021-12-25: qty 2

## 2021-12-25 MED ORDER — BUPIVACAINE-EPINEPHRINE 0.5% -1:200000 IJ SOLN
INTRAMUSCULAR | Status: DC | PRN
  Administered 2021-12-25: 6 mL

## 2021-12-25 MED ORDER — OXYCODONE HCL 5 MG/5ML PO SOLN: 5.0000 mg | Freq: Once | ORAL | Status: AC | PRN

## 2021-12-25 MED ORDER — OXYCODONE HCL 5 MG PO TABS
ORAL_TABLET | ORAL | Status: AC
  Administered 2021-12-25: 5 mg via ORAL
  Filled 2021-12-25: qty 1

## 2021-12-25 MED ORDER — PHENYLEPHRINE 80 MCG/ML (10ML) SYRINGE FOR IV PUSH (FOR BLOOD PRESSURE SUPPORT)
PREFILLED_SYRINGE | INTRAVENOUS | Status: AC
  Filled 2021-12-25: qty 10

## 2021-12-25 MED ORDER — ACETAMINOPHEN 500 MG PO TABS
ORAL_TABLET | ORAL | Status: AC
  Administered 2021-12-25: 1000 mg via ORAL
  Filled 2021-12-25: qty 2

## 2021-12-25 MED ORDER — EPHEDRINE SULFATE (PRESSORS) 50 MG/ML IJ SOLN
INTRAMUSCULAR | Status: DC | PRN
  Administered 2021-12-25 (×2): 5 mg via INTRAVENOUS

## 2021-12-25 MED ORDER — ORAL CARE MOUTH RINSE: 15.0000 mL | Freq: Once | OROMUCOSAL | Status: AC

## 2021-12-25 MED ORDER — LIDOCAINE HCL (PF) 1 % IJ SOLN
INTRAMUSCULAR | Status: AC
  Filled 2021-12-25: qty 30

## 2021-12-25 MED ORDER — IBUPROFEN 800 MG PO TABS: 800.0000 mg | ORAL_TABLET | Freq: Three times a day (TID) | ORAL | 0 refills | Status: DC | PRN

## 2021-12-25 MED ORDER — BUSPIRONE HCL 10 MG PO TABS: 10.0000 mg | ORAL_TABLET | Freq: Two times a day (BID) | ORAL | Status: DC

## 2021-12-25 MED ORDER — TECHNETIUM TC 99M TILMANOCEPT KIT
1.0300 | PACK | Freq: Once | INTRAVENOUS | Status: AC | PRN
  Administered 2021-12-25: 1.03 via INTRADERMAL

## 2021-12-25 MED ORDER — ACETAMINOPHEN 500 MG PO TABS: 1000.0000 mg | ORAL_TABLET | ORAL | Status: AC

## 2021-12-25 MED ORDER — CHLORHEXIDINE GLUCONATE 0.12 % MT SOLN: 15.0000 mL | Freq: Once | OROMUCOSAL | Status: AC

## 2021-12-25 MED ORDER — OXYCODONE HCL 5 MG PO TABS: 5.0000 mg | ORAL_TABLET | Freq: Once | ORAL | Status: AC | PRN

## 2021-12-25 MED ORDER — MIDAZOLAM HCL 2 MG/2ML IJ SOLN
INTRAMUSCULAR | Status: DC | PRN
  Administered 2021-12-25: 1 mg via INTRAVENOUS

## 2021-12-25 MED ORDER — CELECOXIB 200 MG PO CAPS: 200.0000 mg | ORAL_CAPSULE | ORAL | Status: AC

## 2021-12-25 MED ORDER — DEXAMETHASONE SODIUM PHOSPHATE 10 MG/ML IJ SOLN
INTRAMUSCULAR | Status: AC
  Filled 2021-12-25: qty 1

## 2021-12-25 MED ORDER — PROPOFOL 10 MG/ML IV BOLUS
INTRAVENOUS | Status: DC | PRN
  Administered 2021-12-25: 140 mg via INTRAVENOUS

## 2021-12-25 MED ORDER — OXYCODONE-ACETAMINOPHEN 5-325 MG PO TABS: 1.0000 | ORAL_TABLET | Freq: Three times a day (TID) | ORAL | 0 refills | Status: DC | PRN

## 2021-12-25 SURGICAL SUPPLY — 45 items
APPLIER CLIP 11 MED OPEN (CLIP) ×1
BLADE SURG 15 STRL LF DISP TIS (BLADE) ×1 IMPLANT
BLADE SURG 15 STRL SS (BLADE) ×1
CHLORAPREP W/TINT 26 (MISCELLANEOUS) ×1 IMPLANT
CLIP APPLIE 11 MED OPEN (CLIP) IMPLANT
CNTNR SPEC 2.5X3XGRAD LEK (MISCELLANEOUS)
CONT SPEC 4OZ STRL OR WHT (MISCELLANEOUS)
CONTAINER SPEC 2.5X3XGRAD LEK (MISCELLANEOUS) IMPLANT
DERMABOND ADVANCED .7 DNX12 (GAUZE/BANDAGES/DRESSINGS) ×1 IMPLANT
DEVICE DSSCT PLSMBLD 3.0S LGHT (MISCELLANEOUS) ×1 IMPLANT
DEVICE DUBIN SPECIMEN MAMMOGRA (MISCELLANEOUS) ×1 IMPLANT
DRAPE LAPAROTOMY 77X122 PED (DRAPES) ×1 IMPLANT
ELECT CAUTERY BLADE TIP 2.5 (TIP) ×1
ELECT REM PT RETURN 9FT ADLT (ELECTROSURGICAL) ×1
ELECTRODE CAUTERY BLDE TIP 2.5 (TIP) ×1 IMPLANT
ELECTRODE REM PT RTRN 9FT ADLT (ELECTROSURGICAL) ×1 IMPLANT
GAUZE 4X4 16PLY ~~LOC~~+RFID DBL (SPONGE) ×1 IMPLANT
GLOVE BIOGEL PI IND STRL 7.0 (GLOVE) ×1 IMPLANT
GLOVE SURG SYN 6.5 ES PF (GLOVE) ×2 IMPLANT
GLOVE SURG SYN 6.5 PF PI (GLOVE) ×2 IMPLANT
GOWN STRL REUS W/ TWL LRG LVL3 (GOWN DISPOSABLE) ×3 IMPLANT
GOWN STRL REUS W/TWL LRG LVL3 (GOWN DISPOSABLE) ×2
KIT MARKER MARGIN INK (KITS) ×1 IMPLANT
KIT TURNOVER KIT A (KITS) ×1 IMPLANT
LABEL OR SOLS (LABEL) ×1 IMPLANT
LIGHT WAVEGUIDE WIDE FLAT (MISCELLANEOUS) IMPLANT
MANIFOLD NEPTUNE II (INSTRUMENTS) ×1 IMPLANT
MARKER MARGIN CORRECT CLIP (MARKER) ×1 IMPLANT
NEEDLE HYPO 22GX1.5 SAFETY (NEEDLE) ×2 IMPLANT
PACK BASIN MINOR ARMC (MISCELLANEOUS) ×1 IMPLANT
PLASMABLADE 3.0S W/LIGHT (MISCELLANEOUS) ×1
SET LOCALIZER 20 PROBE US (MISCELLANEOUS) ×1 IMPLANT
SUT MNCRL 4-0 (SUTURE) ×1
SUT MNCRL 4-0 27XMFL (SUTURE) ×1
SUT SILK 3 0 12 30 (SUTURE) IMPLANT
SUT VIC AB 3-0 SH 27 (SUTURE) ×1
SUT VIC AB 3-0 SH 27X BRD (SUTURE) ×2 IMPLANT
SUTURE MNCRL 4-0 27XMF (SUTURE) ×2 IMPLANT
SYR 20ML LL LF (SYRINGE) ×1 IMPLANT
SYR BULB IRRIG 60ML STRL (SYRINGE) ×1 IMPLANT
TRAP FLUID SMOKE EVACUATOR (MISCELLANEOUS) ×1 IMPLANT
TRAP NEPTUNE SPECIMEN COLLECT (MISCELLANEOUS) ×1 IMPLANT
TUBING CONNECTING 10 (TUBING) ×1 IMPLANT
WATER STERILE IRR 1000ML POUR (IV SOLUTION) ×1 IMPLANT
WATER STERILE IRR 500ML POUR (IV SOLUTION) ×1 IMPLANT

## 2021-12-25 NOTE — Op Note (Addendum)
Preoperative diagnosis: Right breast carcinoma.  Postoperative diagnosis: Same.   Procedure: Magseed localized right breast partial mastectomy.                      Right axillary Sentinel Lymph node biopsy, Methylene Blue injection  Anesthesia: GETA  Surgeon: Dr. [name]  Wound Classification: Clean  Indications: Patient is a 71 y.o. female with a nonpalpable right breast mass noted on mammography with core biopsy demonstrating carcinoma, recurrent.  Requires Magseed localizer placement, partial mastectomy for treatment with sentinel lymph node biopsy.   Specimen: Right breast mass, Sentinel Lymph nodes x 1, posterior margin  Complications: None  Estimated Blood Loss: 25 mL  Findings: 1. Specimen mammography shows marker and RF localizer on specimen 2. Pathology call refers gross examination of margins was negative with biopsy site, however no evidence of obvious mass 3. No other palpable mass or lymph node identified.  4.  Marker and biopsy site close to the posterior margins of additional posterior margins removed, no obvious mass in this area except for a cyst which was palpated prior to resection  Operation performed with curative intent:Yes  Tracer(s) used to identify sentinel nodes in the upfront surgery (non-neoadjuvant) setting (select all that apply):Dye and Radioactive Tracer  Tracer(s) used to identify sentinel nodes in the neoadjuvant setting (select all that apply):N/A  All nodes (colored or non-colored) present at the end of a dye-filled lymphatic channel were removed:Yes   All significantly radioactive nodes were removed:Yes  All palpable suspicious nodes were removed:N/A  Biopsy-proven positive nodes marked with clips prior to chemotherapy were identified and removed:N/A    Description of procedure: RF localization was performed by radiology prior to procedure. In the nuclear medicine suite, the subareolar region was injected with Tc-99 sulfur colloid  the morning of procedure. Localization studies were reviewed. The patient was taken to the operating room and placed supine on the operating table, and after general anesthesia the right breast and axilla were prepped and draped in the usual sterile fashion. A time-out was completed verifying correct patient, procedure, site, positioning, and implant(s) and/or special equipment prior to beginning this procedure.  Methylene blue was injected due to the previous history of lumpectomy in the area.  5 mL injected in 4 quadrants around the nipple and massaged for 5 minutes.  By identifying the RF localizer, the probable trajectory and location of the mass was visualized. A skin incision was planned in such a way as to minimize the amount of dissection to reach the mass.  The skin incision was made after infusion of local. Flaps were raised and  Sharp and blunt dissection was then taken down to the mass, taking care to include the entire RF localizer and a margin of grossly normal tissue. The specimen was removed. The specimen was oriented with paint and sent to radiology with the localization studies. Confirmation was received marker and biopsy clip within specimen as well as evidence of biopsy site.  However, no obvious malignant mass noted.  This area was noted to be closest to the posterior margin so additional tissue was removed after palpation of the area revealed a possible mass.  Pathology call back stating this was only a cyst and again no obvious malignancy noted.  Since there was no additional way to confirm the area was removed c completely by initial needle biopsy, decision was made to wait for the final pathology results at this point.  A hand-held gamma probe was used to  identify the location of the hottest spot in the axilla. An incision was made around the caudal axillary hairline. Sharp and blunt Dissection was carried down to subdermal facias.  There was evidence of prior dissection in this area  noted with by vessel clips and extensive scarring.  The probe was placed within wound and again, the point of maximal count was found.  The maximal count unfortunately was not extensively high as typical, nor was there any evidence of dye filled channels or lymph nodes.  Dissection continue until 1 visible nodule was identified within the general area of the highest count. The probe was placed in contact with the node and 500 counts were recorded. The node was excised in its entirety. Ex vivo, the node measured 300-400 counts when placed on the probe. The bed of the node measured less than 100 counts. No additional hot spots were identified. No clinically abnormal nodes were palpated.  Procedure terminated at this point since further dissection of the tissue would have extended into a full axillary dissection which was not discussed with patient prior to the procedure.  Pending final pathology, will discuss additional options if needed.    Both wounds irrigated, hemostasis was achieved and the wound closed in layers with  interrupted sutures of 3-0 Vicryl in deep dermal layer and a running subcuticular suture of Monocryl 4-0, then dressed with dermabond. The patient tolerated the procedure well and was taken to the postanesthesia care unit in stable condition. Sponge and instrument count correct at end of procedure.

## 2021-12-25 NOTE — Transfer of Care (Signed)
Immediate Anesthesia Transfer of Care Note  Patient: Chelsey [name]  Procedure(s) Performed: PART MASTECTOMY,RADIO FREQUENCY LOCALIZER,AXILLARY SENTINEL NODE BIOPSY (Right)  Patient Location: PACU  Anesthesia Type:General  Level of Consciousness: drowsy  Airway & Oxygen Therapy: Patient Spontanous Breathing and Patient connected to face mask oxygen  Post-op Assessment: Report given to RN and Post -op Vital signs reviewed and stable  Post vital signs: Reviewed and stable  Last Vitals:  Vitals Value Taken Time  BP 145/82 12/25/21 1506  Temp 36.3 C 12/25/21 1506  Pulse 83 12/25/21 1508  Resp 11 12/25/21 1508  SpO2 100 % 12/25/21 1508  Vitals shown include unvalidated device data.  Last Pain:  Vitals:   12/25/21 1506  TempSrc:   PainSc: Asleep         Complications: No notable events documented.

## 2021-12-25 NOTE — Anesthesia Preprocedure Evaluation (Addendum)
Anesthesia Evaluation  Patient identified by MRN, date of birth, ID band Patient awake    Reviewed: Allergy & Precautions, NPO status , Patient's Chart, lab work & pertinent test results  History of Anesthesia Complications Negative for: history of anesthetic complications  Airway Mallampati: II  TM Distance: >3 FB Neck ROM: full    Dental  (+) Chipped   Pulmonary Current Smoker and Patient abstained from smoking.   Pulmonary exam normal        Cardiovascular negative cardio ROS Normal cardiovascular exam     Neuro/Psych  PSYCHIATRIC DISORDERS Anxiety Depression     Neuromuscular disease    GI/Hepatic Neg liver ROS, hiatal hernia,GERD  ,,  Endo/Other  negative endocrine ROS    Renal/GU      Musculoskeletal   Abdominal   Peds  Hematology   Anesthesia Other Findings Past Medical History: No date: Aortic atherosclerosis ([place]) 1997: Breast cancer ([place])     Comment:  right breast, radiation No date: Bronchitis     Comment:  recent/ 06/09/15 had chest xray/ [facility]               care/resolved 1997: Cancer [facility])     Comment:  right lumpectomy,L/Ad/R  No date: GERD (gastroesophageal reflux disease) No date: History of hiatal hernia No date: Hypercholesteremia No date: Hyperlipidemia No date: Low BP     Comment:  TYPICALLY RUNS 80'S/60'S No date: Osteopenia No date: Panic attack No date: Paraesophageal hernia No date: Personal history of malignant neoplasm of breast  Past Surgical History: 11/23/2016: BICEPT TENODESIS; Left     Comment:  Procedure: BICEPS TENODESIS;  Surgeon: Leim Fabry, MD;              Location: [facility] ORS;  Service: Orthopedics;  Laterality:               Left; 12/21/2021: BREAST BIOPSY; Right     Comment:  MM RT RADIO FREQUENCY TAG LOC MAMMO GUIDE 12/21/2021               [facility] 1997: BREAST EXCISIONAL BIOPSY; Right     Comment:  pos 1997: BREAST SURGERY; Right      Comment:  lumpectomy 08/06/2016: CHOLECYSTECTOMY; N/A     Comment:  Procedure: LAPAROSCOPIC CHOLECYSTECTOMY WITH               INTRAOPERATIVE CHOLANGIOGRAM;  Surgeon: Christene Lye, MD;  Location: [facility];  Service:               General;  Laterality: N/A; 2008: COLONOSCOPY 07/21/2015: COLONOSCOPY WITH PROPOFOL; N/A     Comment:  Procedure: COLONOSCOPY WITH PROPOFOL;  Surgeon: Lucilla Lame, MD;  Location: [place];  Service:               Endoscopy;  Laterality: N/A; 01/05/2018: COLONOSCOPY WITH PROPOFOL; N/A     Comment:  Procedure: COLONOSCOPY WITH PROPOFOL;  Surgeon: Jonathon Bellows, MD;  Location: [facility] ENDOSCOPY;  Service:               Gastroenterology;  Laterality: N/A; 06/16/2016: ESOPHAGOGASTRODUODENOSCOPY (EGD) WITH PROPOFOL; N/A     Comment:  Procedure: ESOPHAGOGASTRODUODENOSCOPY (EGD) WITH  PROPOFOL;  Surgeon: Christene Lye, MD;                Location: [facility] ENDOSCOPY;  Service: Endoscopy;                Laterality: N/A; 01/05/2018: ESOPHAGOGASTRODUODENOSCOPY (EGD) WITH PROPOFOL; N/A     Comment:  Procedure: ESOPHAGOGASTRODUODENOSCOPY (EGD) WITH               PROPOFOL;  Surgeon: Jonathon Bellows, MD;  Location: [facility]               ENDOSCOPY;  Service: Gastroenterology;  Laterality: N/A; 06/13/2018: ESOPHAGOGASTRODUODENOSCOPY (EGD) WITH PROPOFOL; N/A     Comment:  Procedure: ESOPHAGOGASTRODUODENOSCOPY (EGD) WITH               PROPOFOL;  Surgeon: Jonathon Bellows, MD;  Location: [facility]               ENDOSCOPY;  Service: Gastroenterology;  Laterality: N/A; 07/16/2018: ESOPHAGOGASTRODUODENOSCOPY (EGD) WITH PROPOFOL; N/A     Comment:  Procedure: ESOPHAGOGASTRODUODENOSCOPY (EGD) WITH               PROPOFOL;  Surgeon: Lucilla Lame, MD;  Location: [facility]               ENDOSCOPY;  Service: Endoscopy;  Laterality: N/A; 12/27/2016: HARDWARE REMOVAL; Left     Comment:  Procedure: HARDWARE REMOVAL LEFT  PROXIMAL HUMEROUS;                 Surgeon: Leim Fabry, MD;  Location: [facility] ORS;  Service:              Orthopedics;  Laterality: Left; 04/21/2018: IR [place] TUBE CHANGE 4/66/5993: NISSEN FUNDOPLICATION; N/A     Comment:  Procedure: NISSEN FUNDOPLICATION;  Surgeon: Jules Husbands, MD;  Location: [facility] ORS;  Service: General;                Laterality: N/A; 11/23/2016: ORIF HUMERUS FRACTURE; Left     Comment:  Procedure: OPEN REDUCTION INTERNAL FIXATION (ORIF)               PROXIMAL HUMERUS FRACTURE;  Surgeon: Leim Fabry, MD;                Location: [facility] ORS;  Service: Orthopedics;  Laterality:               Left; 03/09/2018: REPAIR OF ESOPHAGUS     Comment:  Procedure: REPAIR OF ESOPHAGUS;  Surgeon: Jules Husbands, MD;  Location: [facility] ORS;  Service: General;; 12/27/2016: REVERSE SHOULDER ARTHROPLASTY; Left     Comment:  Procedure: REVERSE SHOULDER ARTHROPLASTY;  Surgeon:               Leim Fabry, MD;  Location: [facility] ORS;  Service:               Orthopedics;  Laterality: Left; 03/09/2018: ROBOTIC ASSISTED LAPAROSCOPIC REPAIR OF PARAESOPHAGEAL  HERNIA; N/A     Comment:  Procedure: ROBOTIC HIATAL HERNIA CONVERTED TO OPEN;                Surgeon: Jules Husbands, MD;  Location: [facility] ORS;                Service: General;  Laterality: N/A;     Reproductive/Obstetrics negative OB ROS  Anesthesia Physical Anesthesia Plan  ASA: 2  Anesthesia Plan: General ETT   Post-op Pain Management: Dilaudid IV, Tylenol PO (pre-op)* and Celebrex PO (pre-op)*   Induction: Intravenous  PONV Risk Score and Plan: 3 and Ondansetron, Dexamethasone and Treatment may vary due to age or medical condition  Airway Management Planned: Oral ETT  Additional Equipment:   Intra-op Plan:   Post-operative Plan: Extubation in OR  Informed Consent: I have reviewed the patients History and Physical, chart, labs and discussed the procedure including the risks,  benefits and alternatives for the proposed anesthesia with the patient or authorized representative who has indicated his/her understanding and acceptance.     Dental Advisory Given  Plan Discussed with: Anesthesiologist, CRNA and Surgeon  Anesthesia Plan Comments: (Patient consented for risks of anesthesia including but not limited to:  - adverse reactions to medications - damage to eyes, teeth, lips or other oral mucosa - nerve damage due to positioning  - sore throat or hoarseness - Damage to heart, brain, nerves, lungs, other parts of body or loss of life  Patient voiced understanding.)        Anesthesia Quick Evaluation

## 2021-12-25 NOTE — Interval H&P Note (Signed)
History and Physical Interval Note:  12/25/2021 12:49 PM  Chelsey [name]  has presented today for surgery, with the diagnosis of Malignant neoplasm of upper-outer quadrant of right female breast, unspecified estrogen receptor status CMS-HCC C50.411.  The various methods of treatment have been discussed with the patient and family. After consideration of risks, benefits and other options for treatment, the patient has consented to  Procedure(s): PART [place] (Right) as a surgical intervention.  The patient's history has been reviewed, patient examined, no change in status, stable for surgery.  I have reviewed the patient's chart and labs.  Questions were answered to the patient's satisfaction.     Brydan Downard Lysle Pearl

## 2021-12-25 NOTE — Discharge Instructions (Addendum)
Removal, Care After This sheet gives you information about how to care for yourself after your procedure. Your health care provider may also give you more specific instructions. If you have problems or questions, contact your health care provider. What can I expect after the procedure? After the procedure, it is common to have: Soreness. Bruising. Itching. Follow these instructions at home: site care Follow instructions from your health care provider about how to take care of your site. Make sure you: Wash your hands with soap and water before and after you change your bandage (dressing). If soap and water are not available, use hand sanitizer. Leave stitches (sutures), skin glue, or adhesive strips in place. These skin closures may need to stay in place for 2 weeks or longer. If adhesive strip edges start to loosen and curl up, you may trim the loose edges. Do not remove adhesive strips completely unless your health care provider tells you to do that. If the area bleeds or bruises, apply gentle pressure for 10 minutes. OK TO SHOWER IN 24HRS  Check your site every day for signs of infection. Check for: Redness, swelling, or pain. Fluid or blood. Warmth. Pus or a bad smell.  General instructions Rest and then return to your normal activities as told by your health care provider.  tylenol and advil as needed for discomfort.  Please alternate between the two every four hours as needed for pain.    Use narcotics, if prescribed, only when tylenol and motrin is not enough to control pain.  325-650mg every 8hrs to max of 3000mg/24hrs (including the 325mg in every norco dose) for the tylenol.    Advil up to 800mg per dose every 8hrs as needed for pain.   Keep all follow-up visits as told by your health care provider. This is important. Contact a health care provider if: You have redness, swelling, or pain around your site. You have fluid or blood coming from your site. Your site feels warm to  the touch. You have pus or a bad smell coming from your site. You have a fever. Your sutures, skin glue, or adhesive strips loosen or come off sooner than expected. Get help right away if: You have bleeding that does not stop with pressure or a dressing. Summary After the procedure, it is common to have some soreness, bruising, and itching at the site. Follow instructions from your health care provider about how to take care of your site. Check your site every day for signs of infection. Contact a health care provider if you have redness, swelling, or pain around your site, or your site feels warm to the touch. Keep all follow-up visits as told by your health care provider. This is important. This information is not intended to replace advice given to you by your health care provider. Make sure you discuss any questions you have with your health care provider. Document Released: 02/07/2015 Document Revised: 07/11/2017 Document Reviewed: 07/11/2017 Elsevier Interactive Patient Education  2019 [employer]  AMBULATORY SURGERY  DISCHARGE INSTRUCTIONS   The drugs that you were given will stay in your system until tomorrow so for the next 24 hours you should not:  Drive an automobile Make any legal decisions Drink any alcoholic beverage   You may resume regular meals tomorrow.  Today it is better to start with liquids and gradually work up to solid foods.  You may eat anything you prefer, but it is better to start with liquids, then soup and crackers, and gradually   gradually work up to solid foods.   Please notify your doctor immediately if you have any unusual bleeding, trouble breathing, redness and pain at the surgery site, drainage, fever, or pain not relieved by medication.    Your post-operative visit with Dr.                                       is: Date:                        Time:    Please call to schedule your post-operative visit.  Additional Instructions:

## 2021-12-27 NOTE — Anesthesia Postprocedure Evaluation (Signed)
Anesthesia Post Note  Patient: Chelsey [name]  Procedure(s) Performed: PART MASTECTOMY,RADIO FREQUENCY LOCALIZER,AXILLARY SENTINEL NODE BIOPSY (Right)  Patient location during evaluation: PACU Anesthesia Type: General Level of consciousness: awake and alert Pain management: pain level controlled Vital Signs Assessment: post-procedure vital signs reviewed and stable Respiratory status: spontaneous breathing, nonlabored ventilation, respiratory function stable and patient connected to nasal cannula oxygen Cardiovascular status: blood pressure returned to baseline and stable Postop Assessment: no apparent nausea or vomiting Anesthetic complications: no   No notable events documented.   Last Vitals:  Vitals:   12/25/21 1545 12/25/21 1553  BP: 128/84 (!) 135/91  Pulse: 84 94  Resp: 18 16  Temp:  36.7 C  SpO2: 96% 96%    Last Pain:  Vitals:   12/25/21 1553  TempSrc: Temporal  PainSc: [place]

## 2021-12-28 ENCOUNTER — Encounter: Payer: Self-pay | Admitting: Surgery

## 2021-12-30 ENCOUNTER — Other Ambulatory Visit: Payer: Self-pay | Admitting: Pathology

## 2021-12-30 LAB — SURGICAL PATHOLOGY

## 2021-12-31 ENCOUNTER — Encounter: Payer: Self-pay | Admitting: *Deleted

## 2021-12-31 NOTE — Progress Notes (Signed)
Oncotype Dx order [id number] submitted online on path [phone]

## 2022-01-07 DIAGNOSIS — Z170 Estrogen receptor positive status [ER+]: Secondary | ICD-10-CM | POA: Diagnosis not present

## 2022-01-07 DIAGNOSIS — C50411 Malignant neoplasm of upper-outer quadrant of right female breast: Secondary | ICD-10-CM | POA: Diagnosis not present

## 2022-01-08 ENCOUNTER — Encounter: Payer: Self-pay | Admitting: Oncology

## 2022-01-12 ENCOUNTER — Ambulatory Visit
Admission: RE | Admit: 2022-01-12 | Discharge: 2022-01-12 | Disposition: A | Discharge status: Home / Self Care | Payer: Medicare Other | Source: Ambulatory Visit | Attending: Radiation Oncology | Admitting: Radiation Oncology

## 2022-01-12 ENCOUNTER — Encounter: Payer: Self-pay | Admitting: Radiation Oncology

## 2022-01-12 ENCOUNTER — Inpatient Hospital Stay: Payer: Medicare Other | Attending: Oncology | Admitting: Oncology

## 2022-01-12 ENCOUNTER — Encounter: Payer: Self-pay | Admitting: Oncology

## 2022-01-12 VITALS — BP 107/77 | HR 107 | Temp 97.4°F | Resp 20 | Ht 65.0 in | Wt 191.0 lb

## 2022-01-12 VITALS — BP 107/77 | HR 107 | Temp 97.4°F | Resp 20 | Ht 65.0 in | Wt 191.3 lb

## 2022-01-12 DIAGNOSIS — C50411 Malignant neoplasm of upper-outer quadrant of right female breast: Secondary | ICD-10-CM | POA: Diagnosis not present

## 2022-01-12 DIAGNOSIS — Z79899 Other long term (current) drug therapy: Secondary | ICD-10-CM | POA: Diagnosis not present

## 2022-01-12 DIAGNOSIS — Z923 Personal history of irradiation: Secondary | ICD-10-CM | POA: Diagnosis not present

## 2022-01-12 DIAGNOSIS — C50919 Malignant neoplasm of unspecified site of unspecified female breast: Secondary | ICD-10-CM

## 2022-01-12 DIAGNOSIS — C50911 Malignant neoplasm of unspecified site of right female breast: Secondary | ICD-10-CM

## 2022-01-12 DIAGNOSIS — M8580 Other specified disorders of bone density and structure, unspecified site: Secondary | ICD-10-CM | POA: Diagnosis not present

## 2022-01-12 DIAGNOSIS — Z170 Estrogen receptor positive status [ER+]: Secondary | ICD-10-CM | POA: Diagnosis not present

## 2022-01-12 DIAGNOSIS — Z79811 Long term (current) use of aromatase inhibitors: Secondary | ICD-10-CM | POA: Insufficient documentation

## 2022-01-12 DIAGNOSIS — F17210 Nicotine dependence, cigarettes, uncomplicated: Secondary | ICD-10-CM | POA: Diagnosis not present

## 2022-01-12 MED ORDER — LETROZOLE 2.5 MG PO TABS: 2.5000 mg | ORAL_TABLET | Freq: Every day | ORAL | 3 refills | Status: DC

## 2022-01-12 NOTE — Consult Note (Signed)
NEW PATIENT EVALUATION  Name: Chelsey [name]  MRN: [id number]  Date:   01/12/2022     DOB: 05/31/1950   This 71 y.o. female patient presents to the clinic for initial evaluation of stage Ia (pT1c pN0 M0) ER/PR positive.  Solid papillary cancer with invasion status post wide local excision.  REFERRING PHYSICIAN: Venita Lick, NP  CHIEF COMPLAINT:  Chief Complaint  Patient presents with   Breast Cancer    DIAGNOSIS: The encounter diagnosis was Invasive ductal carcinoma of right breast in female [facility]).   PREVIOUS INVESTIGATIONS:  Mammogram and ultrasound reviewed Clinical notes reviewed Pathology reports reviewed  HPI: Patient is a 71 year old female who presented with an abnormal mammogram of her right breast showing 1.1 cm suspicious mass in the 10 o'clock position of the right breast 6 cm from the nipple.  She had a single borderline abnormal right axillary lymph node.  She underwent ultrasound-guided biopsy which was positive for invasive mammary carcinoma no special type.  It had features of solid pseudopapillary carcinoma.  She underwent a wide local excision showing overall grade 2 1.2 cm solid papillary carcinoma with invasion.  There was also DCIS noted.  All margins were negative closest margin for invasive component was 0.9 mm for DCIS was 0.7 mm.  She had 5 sentinel lymph nodes examined all negative for metastatic disease.  Tumor was ER/PR positive HER2/neu not overexpressed.  She had an Oncotype DX performed which was score of 8 showing no benefit to systemic treatment.  She is now referred to [facility] for opinion.  Patient has a history of right whole breast radiation back in 1997 performed here in our department.  She has done well over time.  She is healing well still has some slight erythema of the breast and is currently on antibiotic therapy for possible mastitis.  PLANNED TREATMENT REGIMEN: Right partial breast irradiation  PAST MEDICAL HISTORY:  has a past  medical history of Aortic atherosclerosis ([place]), Breast cancer ([place]) (1997), Bronchitis, Cancer ([place]) (1997), GERD (gastroesophageal reflux disease), History of hiatal hernia, Hypercholesteremia, Hyperlipidemia, Low BP, Osteopenia, Panic attack, Paraesophageal hernia, and Personal history of malignant neoplasm of breast.    PAST SURGICAL HISTORY:  Past Surgical History:  Procedure Laterality Date   BICEPT TENODESIS Left 11/23/2016   Procedure: BICEPS TENODESIS;  Surgeon: Leim Fabry, MD;  Location: [facility];  Service: Orthopedics;  Laterality: Left;   BREAST BIOPSY Right 12/21/2021   MM RT RADIO FREQUENCY TAG LOC MAMMO GUIDE 12/21/2021 [facility]   BREAST EXCISIONAL BIOPSY Right 1997   pos   BREAST SURGERY Right 1997   lumpectomy   CHOLECYSTECTOMY N/A 08/06/2016   Procedure: LAPAROSCOPIC CHOLECYSTECTOMY WITH INTRAOPERATIVE CHOLANGIOGRAM;  Surgeon: Christene Lye, MD;  Location: [facility];  Service: General;  Laterality: N/A;   COLONOSCOPY  2008   COLONOSCOPY WITH PROPOFOL N/A 07/21/2015   Procedure: COLONOSCOPY WITH PROPOFOL;  Surgeon: Lucilla Lame, MD;  Location: [place];  Service: Endoscopy;  Laterality: N/A;   COLONOSCOPY WITH PROPOFOL N/A 01/05/2018   Procedure: COLONOSCOPY WITH PROPOFOL;  Surgeon: Jonathon Bellows, MD;  Location: [facility]COPY;  Service: Gastroenterology;  Laterality: N/A;   ESOPHAGOGASTRODUODENOSCOPY (EGD) WITH PROPOFOL N/A 06/16/2016   Procedure: ESOPHAGOGASTRODUODENOSCOPY (EGD) WITH PROPOFOL;  Surgeon: Christene Lye, MD;  Location: [facility];  Service: Endoscopy;  Laterality: N/A;   ESOPHAGOGASTRODUODENOSCOPY (EGD) WITH PROPOFOL N/A 01/05/2018   Procedure: ESOPHAGOGASTRODUODENOSCOPY (EGD) WITH PROPOFOL;  Surgeon: Jonathon Bellows, MD;  Location: [facility];  Service: Gastroenterology;  Laterality:  N/A;   ESOPHAGOGASTRODUODENOSCOPY (EGD) WITH PROPOFOL N/A 06/13/2018   Procedure: ESOPHAGOGASTRODUODENOSCOPY (EGD) WITH PROPOFOL;  Surgeon:  Jonathon Bellows, MD;  Location: [facility] ENDOSCOPY;  Service: Gastroenterology;  Laterality: N/A;   ESOPHAGOGASTRODUODENOSCOPY (EGD) WITH PROPOFOL N/A 07/16/2018   Procedure: ESOPHAGOGASTRODUODENOSCOPY (EGD) WITH PROPOFOL;  Surgeon: Lucilla Lame, MD;  Location: [facility] ENDOSCOPY;  Service: Endoscopy;  Laterality: N/A;   HARDWARE REMOVAL Left 12/27/2016   Procedure: HARDWARE REMOVAL LEFT  PROXIMAL HUMEROUS;  Surgeon: Leim Fabry, MD;  Location: [facility];  Service: Orthopedics;  Laterality: Left;   IR GJ TUBE CHANGE  6/97/9480   NISSEN FUNDOPLICATION N/A 1/65/5374   Procedure: NISSEN FUNDOPLICATION;  Surgeon: Jules Husbands, MD;  Location: [facility];  Service: General;  Laterality: N/A;   ORIF HUMERUS FRACTURE Left 11/23/2016   Procedure: OPEN REDUCTION INTERNAL FIXATION (ORIF) PROXIMAL HUMERUS FRACTURE;  Surgeon: Leim Fabry, MD;  Location: [facility];  Service: Orthopedics;  Laterality: Left;   PART [place] NODE BIOPSY Right 12/25/2021   Procedure: PART MASTECTOMY,RADIO FREQUENCY LOCALIZER,AXILLARY SENTINEL NODE BIOPSY;  Surgeon: Benjamine Sprague, DO;  Location: [facility];  Service: General;  Laterality: Right;   REPAIR OF ESOPHAGUS  03/09/2018   Procedure: REPAIR OF ESOPHAGUS;  Surgeon: Jules Husbands, MD;  Location: [facility];  Service: General;;   REVERSE SHOULDER ARTHROPLASTY Left 12/27/2016   Procedure: REVERSE SHOULDER ARTHROPLASTY;  Surgeon: Leim Fabry, MD;  Location: [facility];  Service: Orthopedics;  Laterality: Left;   ROBOTIC ASSISTED LAPAROSCOPIC REPAIR OF PARAESOPHAGEAL HERNIA N/A 03/09/2018   Procedure: ROBOTIC HIATAL HERNIA CONVERTED TO OPEN;  Surgeon: Jules Husbands, MD;  Location: [facility];  Service: General;  Laterality: N/A;    FAMILY HISTORY: family history includes Alcohol abuse in her father; Anxiety disorder in her brother and brother; Breast cancer in her maternal grandmother; COPD in her brother; Cancer in her father and maternal grandmother;  Depression in her brother; Heart attack in her father; Hypertension in her father; Kidney disease in her mother; Obesity in her brother.  SOCIAL HISTORY:  reports that she has been smoking cigarettes. She started smoking about 12 years ago. She has a 1.00 pack-year smoking history. She has never used smokeless tobacco. She reports that she does not drink alcohol and does not use drugs.  ALLERGIES: Patient has no known allergies.  MEDICATIONS:  Current Outpatient Medications  Medication Sig Dispense Refill   acetaminophen (TYLENOL) 325 MG tablet Take 2 tablets (650 mg total) by mouth every 8 (eight) hours as needed for mild pain. 40 tablet 0   atorvastatin (LIPITOR) 20 MG tablet Take 1 tablet (20 mg total) by mouth daily. 90 tablet 3   buPROPion (WELLBUTRIN XL) 300 MG 24 hr tablet One tab Once a day 30 tablet 3   busPIRone (BUSPAR) 10 MG tablet Take 1 tablet (10 mg total) by mouth 2 (two) times daily.     ibuprofen (ADVIL) 800 MG tablet Take 1 tablet (800 mg total) by mouth every 8 (eight) hours as needed for mild pain or moderate pain. 30 tablet 0   oxyCODONE-acetaminophen (PERCOCET) 5-325 MG tablet Take 1 tablet by mouth every 8 (eight) hours as needed for severe pain. 6 tablet 0   pantoprazole (PROTONIX) 40 MG tablet Take 1 tablet (40 mg total) by mouth daily. 90 tablet 3   trazodone (DESYREL) 300 MG tablet TAKE 1 TABLET(300 MG) BY MOUTH AT BEDTIME 90 tablet 4   letrozole (FEMARA) 2.5 MG tablet Take 1 tablet (2.5 mg total) by mouth daily.  90 tablet 3   No current facility-administered medications for this encounter.   Facility-Administered Medications Ordered in Other Encounters  Medication Dose Route Frequency Provider Last Rate Last Admin   lidocaine (PF) (XYLOCAINE) 1 % injection 10 mL  10 mL Other Once Sakai, Isami, DO        ECOG PERFORMANCE STATUS:  0 - Asymptomatic  REVIEW OF SYSTEMS: Patient denies any weight loss, fatigue, weakness, fever, chills or night sweats. Patient  denies any loss of vision, blurred vision. Patient denies any ringing  of the ears or hearing loss. No irregular heartbeat. Patient denies heart murmur or history of fainting. Patient denies any chest pain or pain radiating to her upper extremities. Patient denies any shortness of breath, difficulty breathing at night, cough or hemoptysis. Patient denies any swelling in the lower legs. Patient denies any nausea vomiting, vomiting of blood, or coffee ground material in the vomitus. Patient denies any stomach pain. Patient states has had normal bowel movements no significant constipation or diarrhea. Patient denies any dysuria, hematuria or significant nocturia. Patient denies any problems walking, swelling in the joints or loss of balance. Patient denies any skin changes, loss of hair or loss of weight. Patient denies any excessive worrying or anxiety or significant depression. Patient denies any problems with insomnia. Patient denies excessive thirst, polyuria, polydipsia. Patient denies any swollen glands, patient denies easy bruising or easy bleeding. Patient denies any recent infections, allergies or URI. Patient "s visual fields have not changed significantly in recent time.   PHYSICAL EXAM: BP 107/77 (BP Location: Right Wrist, Patient Position: Sitting, Cuff Size: Small)   Pulse (!) 107   Temp (!) 97.4 F (36.3 C) (Tympanic)   Resp 20   Ht _0  (1.651 m) Comment: stated ht  Wt 191 lb 4.8 oz (86.8 kg)   LMP  (LMP Unknown)   BMI 31.83 kg/m  Right breast is somewhat shrunken from prior radiation and surgery.  Slight erythema to the skin at this point consistent with possible early mastitis.  No dominant mass in either breast is noted no axillary or supraclavicular adenopathy is appreciated.  Well-developed well-nourished patient in NAD. HEENT reveals PERLA, EOMI, discs not visualized.  Oral cavity is clear. No oral mucosal lesions are identified. Neck is clear without evidence of cervical or  supraclavicular adenopathy. Lungs are clear to A&P. Cardiac examination is essentially unremarkable with regular rate and rhythm without murmur rub or thrill. Abdomen is benign with no organomegaly or masses noted. Motor sensory and DTR levels are equal and symmetric in the upper and lower extremities. Cranial nerves II through XII are grossly intact. Proprioception is intact. No peripheral adenopathy or edema is identified. No motor or sensory levels are noted. Crude visual fields are within normal range.  LABORATORY DATA: Pathology reports reviewed    RADIOLOGY RESULTS: Mammogram and ultrasound reviewed compatible with above-stated findings   IMPRESSION: Stage Ia ER/PR positive invasive mammary carcinoma  PLAN: At this time I have recommended accelerated partial breast radiation to her right breast using IMRT treatment planning and delivery.  Would plan on delivering 35 Gray in 10 fractions.  I would choose partial breast radiation based on her whole breast radiation over 26 years ago.  Risks and benefits of treatment including further fibrosis of the breast skin reaction fatigue all were discussed in detail.  Also there is a slight increased risk of fat necrosis.  I will allow some more healing of the breast she is currently on antibiotic therapy  and set her up for simulation next week.  Patient comprehends my recommendations well.  She also will benefit from endocrine therapy after completion of radiation.  I would like to take this opportunity to thank you for allowing me to participate in the care of your patient.Noreene Filbert, MD

## 2022-01-12 NOTE — Progress Notes (Signed)
[place]  [phone]) [phone] Fax:(336) [phone]  ID: Chelsey [name] OB: April 28, 1950  MR#: [id number]  LKT#:[id number]  Patient Care Team: Venita Lick, NP as PCP - General (Nurse Practitioner) Christene Lye, MD (General Surgery) Guadalupe Maple, MD (Family Medicine) Daiva Huge, RN as Oncology Nurse Navigator  CHIEF COMPLAINT: Pathologic stage Ia ER/PR positive, HER2 negative invasive carcinoma of the right breast.  Oncotype Dx 8.  INTERVAL HISTORY: Patient returns to clinic today for further evaluation, discussion of her final pathology results, and treatment planning.  She tolerated her mammogram well without significant side effects.  She has no neurologic complaints.  She denies any recent fevers or illnesses.  She has a good appetite and denies weight loss.  She has no chest pain, shortness of breath, cough, or hemoptysis.  She denies any nausea, vomiting, constipation, or diarrhea.  She has no urinary complaints.  Patient offers no specific complaints today.  REVIEW OF SYSTEMS:   Review of Systems  Constitutional: Negative.  Negative for fever, malaise/fatigue and weight loss.  Respiratory: Negative.  Negative for cough and shortness of breath.   Cardiovascular: Negative.  Negative for chest pain and leg swelling.  Gastrointestinal: Negative.  Negative for abdominal pain.  Genitourinary: Negative.  Negative for dysuria.  Musculoskeletal: Negative.  Negative for back pain.  Skin: Negative.  Negative for rash.  Neurological: Negative.  Negative for dizziness, focal weakness, weakness and headaches.  Psychiatric/Behavioral: Negative.  The patient is not nervous/anxious.     As per HPI. Otherwise, a complete review of systems is negative.  PAST MEDICAL HISTORY: Past Medical History:  Diagnosis Date   Aortic atherosclerosis ([place])    Breast cancer ([place]) 1997   right breast, radiation   Bronchitis    recent/ 06/09/15 had chest xray/  [facility]/resolved   Cancer [facility]) 1997   right lumpectomy,L/Ad/R    GERD (gastroesophageal reflux disease)    History of hiatal hernia    Hypercholesteremia    Hyperlipidemia    Low BP    TYPICALLY RUNS 80'S/60'S   Osteopenia    Panic attack    Paraesophageal hernia    Personal history of malignant neoplasm of breast     PAST SURGICAL HISTORY: Past Surgical History:  Procedure Laterality Date   BICEPT TENODESIS Left 11/23/2016   Procedure: BICEPS TENODESIS;  Surgeon: Leim Fabry, MD;  Location: [facility];  Service: Orthopedics;  Laterality: Left;   BREAST BIOPSY Right 12/21/2021   MM RT RADIO FREQUENCY TAG LOC MAMMO GUIDE 12/21/2021 [facility]   BREAST EXCISIONAL BIOPSY Right 1997   pos   BREAST SURGERY Right 1997   lumpectomy   CHOLECYSTECTOMY N/A 08/06/2016   Procedure: LAPAROSCOPIC CHOLECYSTECTOMY WITH INTRAOPERATIVE CHOLANGIOGRAM;  Surgeon: Christene Lye, MD;  Location: [facility];  Service: General;  Laterality: N/A;   COLONOSCOPY  2008   COLONOSCOPY WITH PROPOFOL N/A 07/21/2015   Procedure: COLONOSCOPY WITH PROPOFOL;  Surgeon: Lucilla Lame, MD;  Location: [facility];  Service: Endoscopy;  Laterality: N/A;   COLONOSCOPY WITH PROPOFOL N/A 01/05/2018   Procedure: COLONOSCOPY WITH PROPOFOL;  Surgeon: Jonathon Bellows, MD;  Location: [facility] ENDOSCOPY;  Service: Gastroenterology;  Laterality: N/A;   ESOPHAGOGASTRODUODENOSCOPY (EGD) WITH PROPOFOL N/A 06/16/2016   Procedure: ESOPHAGOGASTRODUODENOSCOPY (EGD) WITH PROPOFOL;  Surgeon: Christene Lye, MD;  Location: [facility];  Service: Endoscopy;  Laterality: N/A;   ESOPHAGOGASTRODUODENOSCOPY (EGD) WITH PROPOFOL N/A 01/05/2018   Procedure: ESOPHAGOGASTRODUODENOSCOPY (EGD) WITH PROPOFOL;  Surgeon: Jonathon Bellows, MD;  Location:  [facility] ENDOSCOPY;  Service: Gastroenterology;  Laterality: N/A;   ESOPHAGOGASTRODUODENOSCOPY (EGD) WITH PROPOFOL N/A 06/13/2018   Procedure: ESOPHAGOGASTRODUODENOSCOPY (EGD) WITH  PROPOFOL;  Surgeon: Jonathon Bellows, MD;  Location: [facility] ENDOSCOPY;  Service: Gastroenterology;  Laterality: N/A;   ESOPHAGOGASTRODUODENOSCOPY (EGD) WITH PROPOFOL N/A 07/16/2018   Procedure: ESOPHAGOGASTRODUODENOSCOPY (EGD) WITH PROPOFOL;  Surgeon: Lucilla Lame, MD;  Location: [facility] ENDOSCOPY;  Service: Endoscopy;  Laterality: N/A;   HARDWARE REMOVAL Left 12/27/2016   Procedure: HARDWARE REMOVAL LEFT  PROXIMAL HUMEROUS;  Surgeon: Leim Fabry, MD;  Location: [facility];  Service: Orthopedics;  Laterality: Left;   IR GJ TUBE CHANGE  1/74/0814   NISSEN FUNDOPLICATION N/A 4/81/8563   Procedure: NISSEN FUNDOPLICATION;  Surgeon: Jules Husbands, MD;  Location: [facility];  Service: General;  Laterality: N/A;   ORIF HUMERUS FRACTURE Left 11/23/2016   Procedure: OPEN REDUCTION INTERNAL FIXATION (ORIF) PROXIMAL HUMERUS FRACTURE;  Surgeon: Leim Fabry, MD;  Location: [facility];  Service: Orthopedics;  Laterality: Left;   PART [place] NODE BIOPSY Right 12/25/2021   Procedure: PART MASTECTOMY,RADIO FREQUENCY LOCALIZER,AXILLARY SENTINEL NODE BIOPSY;  Surgeon: Benjamine Sprague, DO;  Location: [facility];  Service: General;  Laterality: Right;   REPAIR OF ESOPHAGUS  03/09/2018   Procedure: REPAIR OF ESOPHAGUS;  Surgeon: Jules Husbands, MD;  Location: [facility];  Service: General;;   REVERSE SHOULDER ARTHROPLASTY Left 12/27/2016   Procedure: REVERSE SHOULDER ARTHROPLASTY;  Surgeon: Leim Fabry, MD;  Location: [facility];  Service: Orthopedics;  Laterality: Left;   ROBOTIC ASSISTED LAPAROSCOPIC REPAIR OF PARAESOPHAGEAL HERNIA N/A 03/09/2018   Procedure: ROBOTIC HIATAL HERNIA CONVERTED TO OPEN;  Surgeon: Jules Husbands, MD;  Location: [facility];  Service: General;  Laterality: N/A;    FAMILY HISTORY: Family History  Problem Relation Age of Onset   Kidney disease Mother    Cancer Father        stomach   Heart attack Father    Hypertension Father    Alcohol abuse Father    Anxiety  disorder Brother    Obesity Brother    COPD Brother    Depression Brother    Anxiety disorder Brother    Cancer Maternal Grandmother    Breast cancer Maternal Grandmother     ADVANCED DIRECTIVES (Y/N):  N  HEALTH MAINTENANCE: Social History   Tobacco Use   Smoking status: Every Day    Packs/day: 0.10    Years: 10.00    Total pack years: 1.00    Types: Cigarettes    Start date: 11/25/2009   Smokeless tobacco: Never   Tobacco comments:    1-2 cigarette daily  Vaping Use   Vaping Use: Never used  Substance Use Topics   Alcohol use: No    Alcohol/week: 0.0 standard drinks of alcohol   Drug use: No     Colonoscopy:  PAP:  Bone density:  Lipid panel:  No Known Allergies  Current Outpatient Medications  Medication Sig Dispense Refill   acetaminophen (TYLENOL) 325 MG tablet Take 2 tablets (650 mg total) by mouth every 8 (eight) hours as needed for mild pain. 40 tablet 0   atorvastatin (LIPITOR) 20 MG tablet Take 1 tablet (20 mg total) by mouth daily. 90 tablet 3   buPROPion (WELLBUTRIN XL) 300 MG 24 hr tablet One tab Once a day 30 tablet 3   busPIRone (BUSPAR) 10 MG tablet Take 1 tablet (10 mg total) by mouth 2 (two) times daily.     ibuprofen (ADVIL) 800 MG tablet Take 1  tablet (800 mg total) by mouth every 8 (eight) hours as needed for mild pain or moderate pain. 30 tablet 0   oxyCODONE-acetaminophen (PERCOCET) 5-325 MG tablet Take 1 tablet by mouth every 8 (eight) hours as needed for severe pain. 6 tablet 0   pantoprazole (PROTONIX) 40 MG tablet Take 1 tablet (40 mg total) by mouth daily. 90 tablet 3   trazodone (DESYREL) 300 MG tablet TAKE 1 TABLET(300 MG) BY MOUTH AT BEDTIME 90 tablet 4   No current facility-administered medications for this visit.   Facility-Administered Medications Ordered in Other Visits  Medication Dose Route Frequency Provider Last Rate Last Admin   lidocaine (PF) (XYLOCAINE) 1 % injection 10 mL  10 mL Other Once Adrian, Isami, DO         OBJECTIVE: Vitals:   01/12/22 1012  BP: 107/77  Pulse: (!) 107  Resp: 20  Temp: (!) 97.4 F (36.3 C)     Body mass index is 31.78 kg/m.    ECOG FS:0 - Asymptomatic  General: Well-developed, well-nourished, no acute distress. Eyes: Pink conjunctiva, anicteric sclera. HEENT: Normocephalic, moist mucous membranes. Lungs: No audible wheezing or coughing. Heart: Regular rate and rhythm. Abdomen: Soft, nontender, no obvious distention. Musculoskeletal: No edema, cyanosis, or clubbing. Neuro: Alert, answering all questions appropriately. Cranial nerves grossly intact. Skin: No rashes or petechiae noted. Psych: Normal affect.  LAB RESULTS:  Lab Results  Component Value Date   NA 140 12/23/2021   K 4.4 12/23/2021   CL 109 12/23/2021   CO2 25 12/23/2021   GLUCOSE 100 (H) 12/23/2021   BUN 14 12/23/2021   CREATININE 0.74 12/23/2021   CALCIUM 8.7 (L) 12/23/2021   PROT 6.3 01/22/2021   ALBUMIN 3.9 01/22/2021   AST 16 01/22/2021   ALT 10 01/22/2021   ALKPHOS 113 01/22/2021   BILITOT 0.3 01/22/2021   GFRNONAA >60 12/23/2021   GFRAA 93 01/21/2020    Lab Results  Component Value Date   WBC 4.8 12/23/2021   NEUTROABS 3.6 01/22/2021   HGB 14.0 12/23/2021   HCT 42.4 12/23/2021   MCV 92.8 12/23/2021   PLT 220 12/23/2021     STUDIES: MM Breast Surgical Specimen  Result Date: 12/30/2021 CLINICAL DATA:  Status post RIGHT lumpectomy EXAM: SPECIMEN RADIOGRAPH OF THE RIGHT BREAST COMPARISON:  Previous exam(s). FINDINGS: Status post excision of the RIGHT breast. The Magseed and RIBBON shaped biopsy marker clip are present and intact within the provided specimen radiograph. IMPRESSION: Specimen radiograph of the RIGHT breast. Electronically Signed   By: Valentino Saxon M.D.   On: 12/30/2021 09:20  NM Sentinel Node Inj-No Rpt (Breast)  Result Date: 12/25/2021 Sulfur Colloid was injected by the Nuclear Medicine Technologist for sentinel lymph node localization.   MM RT RADIO  FREQUENCY TAG LOC MAMMO GUIDE  Result Date: 12/21/2021 CLINICAL DATA:  Patient with a RIGHT breast cancer scheduled for breast conservation surgery requiring preoperative localization. EXAM: MAMMOGRAPHIC GUIDED MAG SEED LOCALIZATION OF THE RIGHT BREAST COMPARISON:  Previous exam(s). FINDINGS: Patient presents for MAG SEED localization prior to lumpectomy. I met with the patient and we discussed the procedure of MAG SEED localization including benefits and alternatives. We discussed the high likelihood of a successful procedure. We discussed the risks of the procedure including infection, bleeding, tissue injury and further surgery. Informed, written consent was given. The usual time-out protocol was performed immediately prior to the procedure. Using mammographic guidance, sterile technique, 1% lidocaine as local anesthesia, a MAG SEED was used to localize the ribbon  shaped clip within the upper-outer quadrant of the RIGHT breast using a lateral approach. The follow-up mammogram images confirm that the [place] is in the expected location and are marked for Dr. Lysle Pearl. The patient tolerated the procedure well and was released from the [facility]. IMPRESSION: MAG SEED localization of the RIGHT breast. No apparent complications. Electronically Signed   By: Franki Cabot M.D.   On: 12/21/2021 16:26   ASSESSMENT: Pathologic stage Ia ER/PR positive, HER2 negative invasive carcinoma of the right breast.  Oncotype Dx 8  PLAN:    Pathologic stage Ia ER/PR positive, HER2 negative invasive carcinoma of the right breast: Patient has a distant history of right breast cancer in 1997.  She reports she underwent lumpectomy and XRT at that time.  She did not have chemotherapy or hormonal therapy.  Patient underwent lumpectomy on December 25, 2021 confirming stage of disease.  Oncotype Dx score was 8 which is considered low risk therefore no adjuvant chemotherapy is necessary.  Patient had consultation with radiation  oncology earlier today and reports they plan to do 10 fractions of partial breast irradiation.  Patient will benefit from an aromatase inhibitor for 5 years and was given a prescription of letrozole today with instructions not to initiate treatment until after XRT is completed.  She will complete treatment in January 2029.  Return to clinic in 3 months for further evaluation and to assess her toleration of letrozole.  Bone health: Will get a baseline bone mineral density in the next 1 to 2 weeks.  I spent a total of 30 minutes reviewing chart data, face-to-face evaluation with the patient, counseling and coordination of care as detailed above.    Patient expressed understanding and was in agreement with this plan. She also understands that She can call clinic at any time with any questions, concerns, or complaints.    Cancer Staging  Invasive ductal carcinoma of right breast in female [facility]) Staging form: Breast, AJCC 8th Edition - Clinical stage from 12/02/2021: Stage IA (cT1c, cN0, cM0, G2, ER+, PR+, HER2-) - Signed by Lloyd Huger, MD on 12/02/2021 Stage prefix: Initial diagnosis Histologic grading system: 3 grade system   Lloyd Huger, MD   01/12/2022 10:17 AM

## 2022-01-20 ENCOUNTER — Encounter: Payer: Self-pay | Admitting: *Deleted

## 2022-01-20 ENCOUNTER — Ambulatory Visit
Admission: RE | Admit: 2022-01-20 | Discharge: 2022-01-20 | Disposition: A | Discharge status: Home / Self Care | Payer: Medicare Other | Source: Ambulatory Visit | Attending: Radiation Oncology | Admitting: Radiation Oncology

## 2022-01-20 DIAGNOSIS — Z170 Estrogen receptor positive status [ER+]: Secondary | ICD-10-CM | POA: Insufficient documentation

## 2022-01-20 DIAGNOSIS — C50411 Malignant neoplasm of upper-outer quadrant of right female breast: Secondary | ICD-10-CM | POA: Diagnosis not present

## 2022-01-20 DIAGNOSIS — C50911 Malignant neoplasm of unspecified site of right female breast: Secondary | ICD-10-CM | POA: Insufficient documentation

## 2022-01-26 ENCOUNTER — Ambulatory Visit (INDEPENDENT_AMBULATORY_CARE_PROVIDER_SITE_OTHER): Payer: Medicare Other | Admitting: Family Medicine

## 2022-01-26 ENCOUNTER — Encounter: Payer: Self-pay | Admitting: Family Medicine

## 2022-01-26 VITALS — BP 111/79 | HR 87 | Temp 98.4°F | Ht 65.8 in | Wt 192.1 lb

## 2022-01-26 DIAGNOSIS — Z0000 Encounter for general adult medical examination without abnormal findings: Secondary | ICD-10-CM

## 2022-01-26 DIAGNOSIS — D508 Other iron deficiency anemias: Secondary | ICD-10-CM | POA: Diagnosis not present

## 2022-01-26 DIAGNOSIS — E785 Hyperlipidemia, unspecified: Secondary | ICD-10-CM | POA: Diagnosis not present

## 2022-01-26 DIAGNOSIS — I700 Atherosclerosis of aorta: Secondary | ICD-10-CM | POA: Diagnosis not present

## 2022-01-26 DIAGNOSIS — F17210 Nicotine dependence, cigarettes, uncomplicated: Secondary | ICD-10-CM | POA: Diagnosis not present

## 2022-01-26 DIAGNOSIS — F332 Major depressive disorder, recurrent severe without psychotic features: Secondary | ICD-10-CM

## 2022-01-26 DIAGNOSIS — D692 Other nonthrombocytopenic purpura: Secondary | ICD-10-CM | POA: Insufficient documentation

## 2022-01-26 DIAGNOSIS — C50911 Malignant neoplasm of unspecified site of right female breast: Secondary | ICD-10-CM

## 2022-01-26 DIAGNOSIS — Z23 Encounter for immunization: Secondary | ICD-10-CM | POA: Diagnosis not present

## 2022-01-26 DIAGNOSIS — F419 Anxiety disorder, unspecified: Secondary | ICD-10-CM | POA: Diagnosis not present

## 2022-01-26 LAB — URINALYSIS, ROUTINE W REFLEX MICROSCOPIC
Bilirubin, UA: NEGATIVE
Glucose, UA: NEGATIVE
Ketones, UA: NEGATIVE
Leukocytes,UA: NEGATIVE
Nitrite, UA: NEGATIVE
Protein,UA: NEGATIVE
Specific Gravity, UA: 1.025 (ref 1.005–1.030)
Urobilinogen, Ur: 0.2 mg/dL (ref 0.2–1.0)
pH, UA: 5 (ref 5.0–7.5)

## 2022-01-26 LAB — MICROSCOPIC EXAMINATION
Bacteria, UA: NONE SEEN
RBC, Urine: NONE SEEN /hpf (ref 0–2)

## 2022-01-26 MED ORDER — BUSPIRONE HCL 10 MG PO TABS: 10.0000 mg | ORAL_TABLET | Freq: Two times a day (BID) | ORAL | 1 refills | Status: DC

## 2022-01-26 MED ORDER — PANTOPRAZOLE SODIUM 40 MG PO TBEC: 40.0000 mg | DELAYED_RELEASE_TABLET | Freq: Every day | ORAL | 3 refills | Status: DC

## 2022-01-26 MED ORDER — ATORVASTATIN CALCIUM 20 MG PO TABS: 20.0000 mg | ORAL_TABLET | Freq: Every day | ORAL | 1 refills | Status: DC

## 2022-01-26 MED ORDER — BUPROPION HCL ER (XL) 300 MG PO TB24: ORAL_TABLET | ORAL | 1 refills | Status: DC

## 2022-01-26 MED ORDER — TRAZODONE HCL 300 MG PO TABS: ORAL_TABLET | ORAL | 1 refills | Status: DC

## 2022-01-26 NOTE — Patient Instructions (Addendum)
03/16/2022 9:00 AM bone density: [facility]: [address], [place], [place] [address] Phone: [phone]

## 2022-01-26 NOTE — Assessment & Plan Note (Signed)
Rechecking labs today. Await results. Treat as needed.  °

## 2022-01-26 NOTE — Progress Notes (Addendum)
BP 111/79   Pulse 87   Temp 98.4 F (36.9 C) (Oral)   Ht 5' 5.8" (1.671 m)   Wt 192 lb 1.6 oz (87.1 kg)   LMP  (LMP Unknown)   SpO2 98%   BMI 31.19 kg/m    Subjective:    Patient ID: Chelsey [name], female    DOB: 02-01-1950, 72 y.o.   MRN: [id number]  HPI: Chelsey [name] is a 72 y.o. female presenting on 01/26/2022 for comprehensive medical examination. Current medical complaints include:  Has been feeling tired and dizzy first thing in the AM.   HYPERLIPIDEMIA Hyperlipidemia status: excellent compliance Satisfied with current treatment?  yes Side effects:  no Medication compliance: excellent compliance Past cholesterol meds: atorvastatin Supplements: none Aspirin:  no The 10-year ASCVD risk score (Arnett DK, et al., 2019) is: 12.5%   Values used to calculate the score:     Age: 73 years     Sex: Female     Is Non-Hispanic African American: No     Diabetic: No     Tobacco smoker: Yes     Systolic Blood Pressure: 485 mmHg     Is BP treated: No     HDL Cholesterol: 52 mg/dL     Total Cholesterol: 245 mg/dL Chest pain:  no Coronary artery disease:  no  ANXIETY/DEPRESSION Duration: chronic Status:controlled Anxious mood: no  Excessive worrying: no Irritability: no  Sweating: no Nausea: no Palpitations:no Hyperventilation: no Panic attacks: no Agoraphobia: no  Obscessions/compulsions: no Depressed mood: no    01/26/2022   12:03 PM 11/03/2021   11:39 AM 10/09/2021   10:19 AM 09/25/2021   11:07 AM 07/23/2021   11:22 AM  Depression screen PHQ 2/9  Decreased Interest 0 0 0 0 0  Down, Depressed, Hopeless 0 0 0 0 0  PHQ - 2 Score 0 0 0 0 0  Altered sleeping 0 0 0 0 0  Tired, decreased energy 1 0 1 0 0  Change in appetite 0 0 0 0 0  Feeling bad or failure about yourself  0 0 0 0 0  Trouble concentrating 0 0 0 0 0  Moving slowly or fidgety/restless 0 0 0 0 0  Suicidal thoughts 0  0 0 0  PHQ-9 Score 1 0 1 0 0  Difficult doing work/chores Not difficult at  all Not difficult at all Not difficult at all Not difficult at all Not difficult at all      01/26/2022   12:03 PM 10/09/2021   10:20 AM 09/25/2021   11:07 AM 07/23/2021   11:22 AM  GAD 7 : Generalized Anxiety Score  Nervous, Anxious, on Edge 0 0 0 0  Control/stop worrying 0 0 0 0  Worry too much - different things 0 0 1 0  Trouble relaxing 0 0 1 0  Restless 0 0 0 0  Easily annoyed or irritable 0 0 0 0  Afraid - awful might happen 0 0 0 0  Total GAD 7 Score 0 0 2 0  Anxiety Difficulty Not difficult at all Not difficult at all Not difficult at all Not difficult at all   Anhedonia: no Weight changes: no Insomnia: no   Hypersomnia: no Fatigue/loss of energy: no Feelings of worthlessness: no Feelings of guilt: no Impaired concentration/indecisiveness: no Suicidal ideations: no  Crying spells: no Recent Stressors/Life Changes: no   Relationship problems: no   Family stress: no     Financial stress: no    Job  stress: no    Recent death/loss: no  Menopausal Symptoms: no  Depression Screen done today and results listed below:     01/26/2022   12:03 PM 11/03/2021   11:39 AM 10/09/2021   10:19 AM 09/25/2021   11:07 AM 07/23/2021   11:22 AM  Depression screen PHQ 2/9  Decreased Interest 0 0 0 0 0  Down, Depressed, Hopeless 0 0 0 0 0  PHQ - 2 Score 0 0 0 0 0  Altered sleeping 0 0 0 0 0  Tired, decreased energy 1 0 1 0 0  Change in appetite 0 0 0 0 0  Feeling bad or failure about yourself  0 0 0 0 0  Trouble concentrating 0 0 0 0 0  Moving slowly or fidgety/restless 0 0 0 0 0  Suicidal thoughts 0  0 0 0  PHQ-9 Score 1 0 1 0 0  Difficult doing work/chores Not difficult at all Not difficult at all Not difficult at all Not difficult at all Not difficult at all    Past Medical History:  Past Medical History:  Diagnosis Date   Aortic atherosclerosis ([place])    Breast cancer ([place]) 1997   right breast, radiation   Bronchitis    recent/ 06/09/15 had chest xray/ [facility]/resolved   Cancer [facility]) 1997   right lumpectomy,L/Ad/R    GERD (gastroesophageal reflux disease)    History of hiatal hernia    Hypercholesteremia    Hyperlipidemia    Low BP    TYPICALLY RUNS 80'S/60'S   Osteopenia    Panic attack    Paraesophageal hernia    Personal history of malignant neoplasm of breast     Surgical History:  Past Surgical History:  Procedure Laterality Date   BICEPT TENODESIS Left 11/23/2016   Procedure: BICEPS TENODESIS;  Surgeon: Leim Fabry, MD;  Location: [facility];  Service: Orthopedics;  Laterality: Left;   BREAST BIOPSY Right 12/21/2021   MM RT RADIO FREQUENCY TAG LOC MAMMO GUIDE 12/21/2021 [facility]   BREAST EXCISIONAL BIOPSY Right 1997   pos   BREAST SURGERY Right 1997   lumpectomy   CHOLECYSTECTOMY N/A 08/06/2016   Procedure: LAPAROSCOPIC CHOLECYSTECTOMY WITH INTRAOPERATIVE CHOLANGIOGRAM;  Surgeon: Christene Lye, MD;  Location: [facility];  Service: General;  Laterality: N/A;   COLONOSCOPY  2008   COLONOSCOPY WITH PROPOFOL N/A 07/21/2015   Procedure: COLONOSCOPY WITH PROPOFOL;  Surgeon: Lucilla Lame, MD;  Location: [place];  Service: Endoscopy;  Laterality: N/A;   COLONOSCOPY WITH PROPOFOL N/A 01/05/2018   Procedure: COLONOSCOPY WITH PROPOFOL;  Surgeon: Jonathon Bellows, MD;  Location: [facility] ENDOSCOPY;  Service: Gastroenterology;  Laterality: N/A;   ESOPHAGOGASTRODUODENOSCOPY (EGD) WITH PROPOFOL N/A 06/16/2016   Procedure: ESOPHAGOGASTRODUODENOSCOPY (EGD) WITH PROPOFOL;  Surgeon: Christene Lye, MD;  Location: [facility];  Service: Endoscopy;  Laterality: N/A;   ESOPHAGOGASTRODUODENOSCOPY (EGD) WITH PROPOFOL N/A 01/05/2018   Procedure: ESOPHAGOGASTRODUODENOSCOPY (EGD) WITH PROPOFOL;  Surgeon: Jonathon Bellows, MD;  Location: [facility] ENDOSCOPY;  Service: Gastroenterology;  Laterality: N/A;   ESOPHAGOGASTRODUODENOSCOPY (EGD) WITH PROPOFOL N/A 06/13/2018   Procedure: ESOPHAGOGASTRODUODENOSCOPY (EGD) WITH PROPOFOL;  Surgeon:  Jonathon Bellows, MD;  Location: [facility];  Service: Gastroenterology;  Laterality: N/A;   ESOPHAGOGASTRODUODENOSCOPY (EGD) WITH PROPOFOL N/A 07/16/2018   Procedure: ESOPHAGOGASTRODUODENOSCOPY (EGD) WITH PROPOFOL;  Surgeon: Lucilla Lame, MD;  Location: [facility] ENDOSCOPY;  Service: Endoscopy;  Laterality: N/A;   HARDWARE REMOVAL Left 12/27/2016   Procedure: HARDWARE REMOVAL LEFT  PROXIMAL HUMEROUS;  Surgeon: Leim Fabry, MD;  Location: [facility];  Service: Orthopedics;  Laterality: Left;   IR GJ TUBE CHANGE  9/38/1017   NISSEN FUNDOPLICATION N/A 06/04/2583   Procedure: NISSEN FUNDOPLICATION;  Surgeon: Jules Husbands, MD;  Location: [facility];  Service: General;  Laterality: N/A;   ORIF HUMERUS FRACTURE Left 11/23/2016   Procedure: OPEN REDUCTION INTERNAL FIXATION (ORIF) PROXIMAL HUMERUS FRACTURE;  Surgeon: Leim Fabry, MD;  Location: [facility];  Service: Orthopedics;  Laterality: Left;   PART [place] NODE BIOPSY Right 12/25/2021   Procedure: PART MASTECTOMY,RADIO FREQUENCY LOCALIZER,AXILLARY SENTINEL NODE BIOPSY;  Surgeon: Benjamine Sprague, DO;  Location: [facility];  Service: General;  Laterality: Right;   REPAIR OF ESOPHAGUS  03/09/2018   Procedure: REPAIR OF ESOPHAGUS;  Surgeon: Jules Husbands, MD;  Location: [facility];  Service: General;;   REVERSE SHOULDER ARTHROPLASTY Left 12/27/2016   Procedure: REVERSE SHOULDER ARTHROPLASTY;  Surgeon: Leim Fabry, MD;  Location: [facility];  Service: Orthopedics;  Laterality: Left;   ROBOTIC ASSISTED LAPAROSCOPIC REPAIR OF PARAESOPHAGEAL HERNIA N/A 03/09/2018   Procedure: ROBOTIC HIATAL HERNIA CONVERTED TO OPEN;  Surgeon: Jules Husbands, MD;  Location: [facility];  Service: General;  Laterality: N/A;    Medications:  Current Outpatient Medications on File Prior to Visit  Medication Sig   letrozole (FEMARA) 2.5 MG tablet Take 1 tablet (2.5 mg total) by mouth daily.   Current Facility-Administered Medications on File Prior to  Visit  Medication   lidocaine (PF) (XYLOCAINE) 1 % injection 10 mL    Allergies:  No Known Allergies  Social History:  Social History   Socioeconomic History   Marital status: Widowed    Spouse name: Not on file   Number of children: 2   Years of education: Not on file   Highest education level: Bachelor's degree (e.g., BA, AB, BS)  Occupational History   Not on file  Tobacco Use   Smoking status: Every Day    Packs/day: 0.10    Years: 10.00    Total pack years: 1.00    Types: Cigarettes    Start date: 11/25/2009   Smokeless tobacco: Never   Tobacco comments:    1-2 cigarette daily  Vaping Use   Vaping Use: Never used  Substance and Sexual Activity   Alcohol use: No    Alcohol/week: 0.0 standard drinks of alcohol   Drug use: No   Sexual activity: Never  Other Topics Concern   Not on file  Social History Narrative   Lives with son   Social Determinants of Health   Financial Resource Strain: Low Risk  (11/03/2021)   Overall Financial Resource Strain (CARDIA)    Difficulty of Paying Living Expenses: Not very hard  Food Insecurity: No Food Insecurity (11/03/2021)   Hunger Vital Sign    Worried About Running Out of Food in the Last Year: Never true    Ran Out of Food in the Last Year: Never true  Transportation Needs: Unknown (11/03/2021)   PRAPARE - Hydrologist (Medical): Not on file    Lack of Transportation (Non-Medical): No  Physical Activity: Inactive (11/03/2021)   Exercise Vital Sign    Days of Exercise per Week: 0 days    Minutes of Exercise per Session: 0 min  Stress: No Stress Concern Present (11/03/2021)   [place]    Feeling of Stress : Not at all  Social Connections: Socially Isolated (11/03/2021)   Social Connection and  Isolation Panel [NHANES]    Frequency of Communication with Friends and Family: Twice a week    Frequency of Social Gatherings with  Friends and Family: Never    Attends Religious Services: More than 4 times per year    Active Member of [employer] or Organizations: Not on file    Attends Archivist Meetings: Never    Marital Status: Widowed  Intimate Partner Violence: Not At Risk (10/22/2020)   Humiliation, Afraid, Rape, and Kick questionnaire    Fear of Current or Ex-Partner: No    Emotionally Abused: No    Physically Abused: No    Sexually Abused: No   Social History   Tobacco Use  Smoking Status Every Day   Packs/day: 0.10   Years: 10.00   Total pack years: 1.00   Types: Cigarettes   Start date: 11/25/2009  Smokeless Tobacco Never  Tobacco Comments   1-2 cigarette daily   Social History   Substance and Sexual Activity  Alcohol Use No   Alcohol/week: 0.0 standard drinks of alcohol    Family History:  Family History  Problem Relation Age of Onset   Kidney disease Mother    Cancer Father        stomach   Heart attack Father    Hypertension Father    Alcohol abuse Father    Anxiety disorder Brother    Obesity Brother    COPD Brother    Depression Brother    Anxiety disorder Brother    Cancer Maternal Grandmother    Breast cancer Maternal Grandmother     Past medical history, surgical history, medications, allergies, family history and social history reviewed with patient today and changes made to appropriate areas of the chart.   Review of Systems  Constitutional: Negative.   HENT: Negative.    Eyes: Negative.   Respiratory:  Positive for cough. Negative for hemoptysis, sputum production, shortness of breath and wheezing.   Cardiovascular: Negative.   Gastrointestinal:  Positive for constipation. Negative for abdominal pain, blood in stool, diarrhea, heartburn, melena, nausea and vomiting.  Genitourinary:  Positive for frequency. Negative for dysuria, flank pain, hematuria and urgency.  Musculoskeletal:  Positive for joint pain. Negative for back pain, falls, myalgias and neck pain.   Skin: Negative.   Neurological: Negative.   Endo/Heme/Allergies:  Negative for environmental allergies and polydipsia. Bruises/bleeds easily.  Psychiatric/Behavioral: Negative.     All other ROS negative except what is listed above and in the HPI.      Objective:    BP 111/79   Pulse 87   Temp 98.4 F (36.9 C) (Oral)   Ht 5' 5.8" (1.671 m)   Wt 192 lb 1.6 oz (87.1 kg)   LMP  (LMP Unknown)   SpO2 98%   BMI 31.19 kg/m   Wt Readings from Last 3 Encounters:  01/26/22 192 lb 1.6 oz (87.1 kg)  01/12/22 191 lb 4.8 oz (86.8 kg)  01/12/22 191 lb (86.6 kg)    Physical Exam Vitals and nursing note reviewed.  Constitutional:      General: She is not in acute distress.    Appearance: Normal appearance. She is not ill-appearing, toxic-appearing or diaphoretic.  HENT:     Head: Normocephalic and atraumatic.     Right Ear: Tympanic membrane, ear canal and external ear normal. There is no impacted cerumen.     Left Ear: Tympanic membrane, ear canal and external ear normal. There is no impacted cerumen.     Nose:  Nose normal. No congestion or rhinorrhea.     Mouth/Throat:     Mouth: Mucous membranes are moist.     Pharynx: Oropharynx is clear. No oropharyngeal exudate or posterior oropharyngeal erythema.  Eyes:     General: No scleral icterus.       Right eye: No discharge.        Left eye: No discharge.     Extraocular Movements: Extraocular movements intact.     Conjunctiva/sclera: Conjunctivae normal.     Pupils: Pupils are equal, round, and reactive to light.  Neck:     Vascular: No carotid bruit.  Cardiovascular:     Rate and Rhythm: Normal rate and regular rhythm.     Pulses: Normal pulses.     Heart sounds: No murmur heard.    No friction rub. No gallop.  Pulmonary:     Effort: Pulmonary effort is normal. No respiratory distress.     Breath sounds: Normal breath sounds. No stridor. No wheezing, rhonchi or rales.  Chest:     Chest wall: No tenderness.  Abdominal:      General: Abdomen is flat. Bowel sounds are normal. There is no distension.     Palpations: Abdomen is soft. There is no mass.     Tenderness: There is no abdominal tenderness. There is no right CVA tenderness, left CVA tenderness, guarding or rebound.     Hernia: No hernia is present.  Genitourinary:    Comments: Breast and pelvic exams deferred with shared decision making Musculoskeletal:        General: No swelling, tenderness, deformity or signs of injury.     Cervical back: Normal range of motion and neck supple. No rigidity. No muscular tenderness.     Right lower leg: No edema.     Left lower leg: No edema.  Lymphadenopathy:     Cervical: No cervical adenopathy.  Skin:    General: Skin is warm and dry.     Capillary Refill: Capillary refill takes less than 2 seconds.     Coloration: Skin is not jaundiced or pale.     Findings: No bruising, erythema, lesion or rash.  Neurological:     General: No focal deficit present.     Mental Status: She is alert and oriented to person, place, and time. Mental status is at baseline.     Cranial Nerves: No cranial nerve deficit.     Sensory: No sensory deficit.     Motor: No weakness.     Coordination: Coordination normal.     Gait: Gait normal.     Deep Tendon Reflexes: Reflexes normal.  Psychiatric:        Mood and Affect: Mood normal.        Behavior: Behavior normal.        Thought Content: Thought content normal.        Judgment: Judgment normal.     Results for orders placed or performed in visit on 01/26/22  Microscopic Examination   Urine  Result Value Ref Range   WBC, UA 0-5 0 - 5 /hpf   RBC, Urine None seen 0 - 2 /hpf   Epithelial Cells (non renal) 0-10 0 - 10 /hpf   Bacteria, UA None seen None seen/Few  Urinalysis, Routine w reflex microscopic  Result Value Ref Range   Specific Gravity, UA 1.025 1.005 - 1.030   pH, UA 5.0 5.0 - 7.5   Color, UA Yellow Yellow   Appearance Ur Clear Clear  Leukocytes,UA Negative  Negative   Protein,UA Negative Negative/Trace   Glucose, UA Negative Negative   Ketones, UA Negative Negative   RBC, UA Trace (A) Negative   Bilirubin, UA Negative Negative   Urobilinogen, Ur 0.2 0.2 - 1.0 mg/dL   Nitrite, UA Negative Negative   Microscopic Examination See below:       Assessment & Plan:   Problem List Items Addressed This Visit       Cardiovascular and Mediastinum   Aortic atherosclerosis ([place])    Will keep BP and cholesterol under good control. Continue to monitor. Call with any concerns.       Relevant Medications   atorvastatin (LIPITOR) 20 MG tablet   Other Relevant Orders   CBC with Differential/Platelet   Comprehensive metabolic panel   TSH   Senile purpura ([place])    Reassured patient. Continue to monitor.       Relevant Medications   atorvastatin (LIPITOR) 20 MG tablet     Other   Anemia    Rechecking labs today. Await results. Treat as needed.       Relevant Orders   CBC with Differential/Platelet   Comprehensive metabolic panel   Nicotine dependence, cigarettes, uncomplicated    Not interested in quitting. Labs drawn today.       Relevant Orders   Urinalysis, Routine w reflex microscopic (Completed)   Microscopic Examination (Completed)   Major depressive disorder, recurrent severe without psychotic features ([place])    Under good control on current regimen. Continue current regimen. Continue to monitor. Call with any concerns. Refills given. Labs drawn today.       Relevant Medications   buPROPion (WELLBUTRIN XL) 300 MG 24 hr tablet   busPIRone (BUSPAR) 10 MG tablet   trazodone (DESYREL) 300 MG tablet   Other Relevant Orders   CBC with Differential/Platelet   Comprehensive metabolic panel   TSH   Anxiety    Under good control on current regimen. Continue current regimen. Continue to monitor. Call with any concerns. Refills given. Labs drawn today.        Relevant Medications   buPROPion (WELLBUTRIN XL) 300 MG 24 hr tablet    busPIRone (BUSPAR) 10 MG tablet   trazodone (DESYREL) 300 MG tablet   Other Relevant Orders   CBC with Differential/Platelet   Comprehensive metabolic panel   TSH   Hyperlipidemia    Under good control on current regimen. Continue current regimen. Continue to monitor. Call with any concerns. Refills given. Labs drawn today.       Relevant Medications   atorvastatin (LIPITOR) 20 MG tablet   Other Relevant Orders   CBC with Differential/Platelet   Comprehensive metabolic panel   Lipid Panel w/o Chol/HDL Ratio   Invasive ductal carcinoma of right breast in female [facility])    Following with oncology. Continue to monitor. Call with any concerns.       Other Visit Diagnoses     Routine general medical examination at a health care facility    -  Primary   Vaccines up to date. Screening labs checked today. DEXA scheduled. Mammo and Colonoscopy up to date. Continue diet and exercise. Call with any concerns.   Needs flu shot       Flu shot given today.   Relevant Orders   Flu Vaccine QUAD High Dose(Fluad) (Completed)        Follow up plan: Return in about 6 months (around 07/27/2022).   LABORATORY TESTING:  - Pap smear: not applicable  IMMUNIZATIONS:   - Tdap: Tetanus vaccination status reviewed: last tetanus booster within 10 years. - Influenza: Administered today - Pneumovax: Up to date - Prevnar: Up to date - COVID: Up to date - HPV: Not applicable - Shingrix vaccine: Refused  SCREENING: -Mammogram: Up to date  - Colonoscopy: Up to date  - Bone Density:  Scheduled    PATIENT COUNSELING:   Advised to take 1 mg of folate supplement per day if capable of pregnancy.   Sexuality: Discussed sexually transmitted diseases, partner selection, use of condoms, avoidance of unintended pregnancy  and contraceptive alternatives.   Advised to avoid cigarette smoking.  I discussed with the patient that most people either abstain from alcohol or drink within safe limits (<=14/week and  <=4 drinks/occasion for males, <=7/weeks and <= 3 drinks/occasion for females) and that the risk for alcohol disorders and other health effects rises proportionally with the number of drinks per week and how often a drinker exceeds daily limits.  Discussed cessation/primary prevention of drug use and availability of treatment for abuse.   Diet: Encouraged to adjust caloric intake to maintain  or achieve ideal body weight, to reduce intake of dietary saturated fat and total fat, to limit sodium intake by avoiding high sodium foods and not adding table salt, and to maintain adequate dietary potassium and calcium preferably from fresh fruits, vegetables, and low-fat dairy products.    stressed the importance of regular exercise  Injury prevention: Discussed safety belts, safety helmets, smoke detector, smoking near bedding or upholstery.   Dental health: Discussed importance of regular tooth brushing, flossing, and dental visits.    NEXT PREVENTATIVE PHYSICAL DUE IN 1 YEAR. Return in about 6 months (around 07/27/2022).

## 2022-01-26 NOTE — Assessment & Plan Note (Signed)
Under good control on current regimen. Continue current regimen. Continue to monitor. Call with any concerns. Refills given. Labs drawn today.   

## 2022-01-26 NOTE — Assessment & Plan Note (Signed)
Reassured patient. Continue to monitor.  

## 2022-01-26 NOTE — Assessment & Plan Note (Signed)
Not interested in quitting. Labs drawn today.

## 2022-01-26 NOTE — Assessment & Plan Note (Signed)
Following with oncology. Continue to monitor. Call with any concerns.

## 2022-01-26 NOTE — Assessment & Plan Note (Signed)
Will keep BP and cholesterol under good control. Continue to monitor. Call with any concerns.  

## 2022-01-27 LAB — COMPREHENSIVE METABOLIC PANEL
ALT: 9 IU/L (ref 0–32)
AST: 12 IU/L (ref 0–40)
Albumin/Globulin Ratio: 1.7 (ref 1.2–2.2)
Albumin: 4 g/dL (ref 3.9–4.9)
Alkaline Phosphatase: 149 IU/L — ABNORMAL HIGH (ref 44–121)
BUN/Creatinine Ratio: 17 (ref 12–28)
BUN: 14 mg/dL (ref 8–27)
Bilirubin Total: 0.4 mg/dL (ref 0.0–1.2)
CO2: 23 mmol/L (ref 20–29)
Calcium: 8.8 mg/dL (ref 8.7–10.3)
Chloride: 104 mmol/L (ref 96–106)
Creatinine, Ser: 0.82 mg/dL (ref 0.57–1.00)
Globulin, Total: 2.3 g/dL (ref 1.5–4.5)
Glucose: 84 mg/dL (ref 70–99)
Potassium: 4.3 mmol/L (ref 3.5–5.2)
Sodium: 140 mmol/L (ref 134–144)
Total Protein: 6.3 g/dL (ref 6.0–8.5)
eGFR: 77 mL/min/{1.73_m2} (ref >59)

## 2022-01-27 LAB — CBC WITH DIFFERENTIAL/PLATELET
Basophils Absolute: 0 10*3/uL (ref 0.0–0.2)
Basos: 1 %
EOS (ABSOLUTE): 0.1 10*3/uL (ref 0.0–0.4)
Eos: 2 %
Hematocrit: 41.3 % (ref 34.0–46.6)
Hemoglobin: 13.3 g/dL (ref 11.1–15.9)
Immature Grans (Abs): 0 10*3/uL (ref 0.0–0.1)
Immature Granulocytes: 0 %
Lymphocytes Absolute: 0.9 10*3/uL (ref 0.7–3.1)
Lymphs: 18 %
MCH: 30.6 pg (ref 26.6–33.0)
MCHC: 32.2 g/dL (ref 31.5–35.7)
MCV: 95 fL (ref 79–97)
Monocytes Absolute: 0.4 10*3/uL (ref 0.1–0.9)
Monocytes: 8 %
Neutrophils Absolute: 3.7 10*3/uL (ref 1.4–7.0)
Neutrophils: 71 %
Platelets: 231 10*3/uL (ref 150–450)
RBC: 4.35 x10E6/uL (ref 3.77–5.28)
RDW: 12.3 % (ref 11.7–15.4)
WBC: 5.2 10*3/uL (ref 3.4–10.8)

## 2022-01-27 LAB — LIPID PANEL W/O CHOL/HDL RATIO
Cholesterol, Total: 177 mg/dL (ref 100–199)
HDL: 52 mg/dL (ref >39)
LDL Chol Calc (NIH): 97 mg/dL (ref 0–99)
Triglycerides: 161 mg/dL — ABNORMAL HIGH (ref 0–149)
VLDL Cholesterol Cal: 28 mg/dL (ref 5–40)

## 2022-01-27 LAB — TSH: TSH: 4.96 u[IU]/mL — ABNORMAL HIGH (ref 0.450–4.500)

## 2022-01-28 DIAGNOSIS — Z923 Personal history of irradiation: Secondary | ICD-10-CM | POA: Insufficient documentation

## 2022-01-28 DIAGNOSIS — Z170 Estrogen receptor positive status [ER+]: Secondary | ICD-10-CM | POA: Insufficient documentation

## 2022-01-28 DIAGNOSIS — Z510 Encounter for antineoplastic radiation therapy: Secondary | ICD-10-CM | POA: Insufficient documentation

## 2022-01-28 DIAGNOSIS — C50411 Malignant neoplasm of upper-outer quadrant of right female breast: Secondary | ICD-10-CM | POA: Diagnosis not present

## 2022-01-28 DIAGNOSIS — C50911 Malignant neoplasm of unspecified site of right female breast: Secondary | ICD-10-CM | POA: Insufficient documentation

## 2022-01-29 ENCOUNTER — Other Ambulatory Visit: Payer: Self-pay | Admitting: *Deleted

## 2022-01-29 DIAGNOSIS — C50911 Malignant neoplasm of unspecified site of right female breast: Secondary | ICD-10-CM

## 2022-02-04 ENCOUNTER — Ambulatory Visit: Payer: Medicare Other

## 2022-02-04 DIAGNOSIS — C50411 Malignant neoplasm of upper-outer quadrant of right female breast: Secondary | ICD-10-CM | POA: Diagnosis not present

## 2022-02-04 DIAGNOSIS — Z170 Estrogen receptor positive status [ER+]: Secondary | ICD-10-CM | POA: Diagnosis not present

## 2022-02-04 DIAGNOSIS — Z923 Personal history of irradiation: Secondary | ICD-10-CM | POA: Diagnosis not present

## 2022-02-04 DIAGNOSIS — Z510 Encounter for antineoplastic radiation therapy: Secondary | ICD-10-CM | POA: Diagnosis not present

## 2022-02-05 ENCOUNTER — Other Ambulatory Visit: Payer: Self-pay | Admitting: *Deleted

## 2022-02-05 ENCOUNTER — Encounter: Payer: Self-pay | Admitting: Family Medicine

## 2022-02-05 DIAGNOSIS — C50911 Malignant neoplasm of unspecified site of right female breast: Secondary | ICD-10-CM

## 2022-02-05 DIAGNOSIS — C50411 Malignant neoplasm of upper-outer quadrant of right female breast: Secondary | ICD-10-CM | POA: Diagnosis not present

## 2022-02-05 DIAGNOSIS — Z170 Estrogen receptor positive status [ER+]: Secondary | ICD-10-CM | POA: Diagnosis not present

## 2022-02-08 ENCOUNTER — Other Ambulatory Visit: Payer: Self-pay | Admitting: Surgery

## 2022-02-08 ENCOUNTER — Ambulatory Visit: Payer: Medicare Other

## 2022-02-08 ENCOUNTER — Ambulatory Visit
Admission: RE | Admit: 2022-02-08 | Discharge: 2022-02-08 | Disposition: A | Discharge status: Home / Self Care | Payer: Medicare Other | Source: Ambulatory Visit | Attending: Radiation Oncology | Admitting: Radiation Oncology

## 2022-02-08 DIAGNOSIS — N611 Abscess of the breast and nipple: Secondary | ICD-10-CM

## 2022-02-09 ENCOUNTER — Other Ambulatory Visit: Payer: Self-pay

## 2022-02-09 ENCOUNTER — Ambulatory Visit
Admission: RE | Admit: 2022-02-09 | Discharge: 2022-02-09 | Disposition: A | Discharge status: Home / Self Care | Payer: Medicare Other | Source: Ambulatory Visit | Attending: Radiation Oncology | Admitting: Radiation Oncology

## 2022-02-09 DIAGNOSIS — Z170 Estrogen receptor positive status [ER+]: Secondary | ICD-10-CM | POA: Diagnosis not present

## 2022-02-09 DIAGNOSIS — Z923 Personal history of irradiation: Secondary | ICD-10-CM | POA: Diagnosis not present

## 2022-02-09 DIAGNOSIS — Z510 Encounter for antineoplastic radiation therapy: Secondary | ICD-10-CM | POA: Diagnosis not present

## 2022-02-09 DIAGNOSIS — C50411 Malignant neoplasm of upper-outer quadrant of right female breast: Secondary | ICD-10-CM | POA: Diagnosis not present

## 2022-02-09 LAB — RAD ONC ARIA SESSION SUMMARY
Course Elapsed Days: 0
Plan Fractions Treated to Date: 1
Plan Prescribed Dose Per Fraction: 6 Gy
Plan Total Fractions Prescribed: 5
Plan Total Prescribed Dose: 30 Gy
Reference Point Dosage Given to Date: 6 Gy
Reference Point Session Dosage Given: 6 Gy
Session Number: 1

## 2022-02-10 ENCOUNTER — Ambulatory Visit
Admission: RE | Admit: 2022-02-10 | Discharge: 2022-02-10 | Disposition: A | Discharge status: Home / Self Care | Payer: Medicare Other | Source: Ambulatory Visit | Attending: Radiation Oncology | Admitting: Radiation Oncology

## 2022-02-10 ENCOUNTER — Other Ambulatory Visit: Payer: Self-pay

## 2022-02-10 ENCOUNTER — Inpatient Hospital Stay: Payer: Medicare Other

## 2022-02-10 DIAGNOSIS — F17210 Nicotine dependence, cigarettes, uncomplicated: Secondary | ICD-10-CM | POA: Insufficient documentation

## 2022-02-10 DIAGNOSIS — Z170 Estrogen receptor positive status [ER+]: Secondary | ICD-10-CM | POA: Insufficient documentation

## 2022-02-10 DIAGNOSIS — C50911 Malignant neoplasm of unspecified site of right female breast: Secondary | ICD-10-CM | POA: Insufficient documentation

## 2022-02-10 DIAGNOSIS — Z510 Encounter for antineoplastic radiation therapy: Secondary | ICD-10-CM | POA: Diagnosis not present

## 2022-02-10 DIAGNOSIS — Z923 Personal history of irradiation: Secondary | ICD-10-CM | POA: Diagnosis not present

## 2022-02-10 DIAGNOSIS — C50411 Malignant neoplasm of upper-outer quadrant of right female breast: Secondary | ICD-10-CM | POA: Diagnosis not present

## 2022-02-10 LAB — RAD ONC ARIA SESSION SUMMARY
Course Elapsed Days: 1
Plan Fractions Treated to Date: 2
Plan Prescribed Dose Per Fraction: 6 Gy
Plan Total Fractions Prescribed: 5
Plan Total Prescribed Dose: 30 Gy
Reference Point Dosage Given to Date: 12 Gy
Reference Point Session Dosage Given: 6 Gy
Session Number: 2

## 2022-02-10 LAB — CBC
HCT: 41.7 % (ref 36.0–46.0)
Hemoglobin: 13.7 g/dL (ref 12.0–15.0)
MCH: 30.2 pg (ref 26.0–34.0)
MCHC: 32.9 g/dL (ref 30.0–36.0)
MCV: 92.1 fL (ref 80.0–100.0)
Platelets: 235 10*3/uL (ref 150–400)
RBC: 4.53 MIL/uL (ref 3.87–5.11)
RDW: 12.4 % (ref 11.5–15.5)
WBC: 5.6 10*3/uL (ref 4.0–10.5)
nRBC: 0 % (ref 0.0–0.2)

## 2022-02-11 ENCOUNTER — Ambulatory Visit
Admission: RE | Admit: 2022-02-11 | Discharge: 2022-02-11 | Disposition: A | Discharge status: Home / Self Care | Payer: Medicare Other | Source: Ambulatory Visit | Attending: Radiation Oncology | Admitting: Radiation Oncology

## 2022-02-11 ENCOUNTER — Other Ambulatory Visit: Payer: Self-pay

## 2022-02-11 DIAGNOSIS — C50411 Malignant neoplasm of upper-outer quadrant of right female breast: Secondary | ICD-10-CM | POA: Diagnosis not present

## 2022-02-11 DIAGNOSIS — Z923 Personal history of irradiation: Secondary | ICD-10-CM | POA: Diagnosis not present

## 2022-02-11 DIAGNOSIS — Z170 Estrogen receptor positive status [ER+]: Secondary | ICD-10-CM | POA: Diagnosis not present

## 2022-02-11 DIAGNOSIS — Z510 Encounter for antineoplastic radiation therapy: Secondary | ICD-10-CM | POA: Diagnosis not present

## 2022-02-11 LAB — RAD ONC ARIA SESSION SUMMARY
Course Elapsed Days: 2
Plan Fractions Treated to Date: 3
Plan Prescribed Dose Per Fraction: 6 Gy
Plan Total Fractions Prescribed: 5
Plan Total Prescribed Dose: 30 Gy
Reference Point Dosage Given to Date: 18 Gy
Reference Point Session Dosage Given: 6 Gy
Session Number: 3

## 2022-02-12 ENCOUNTER — Ambulatory Visit
Admission: RE | Admit: 2022-02-12 | Discharge: 2022-02-12 | Disposition: A | Discharge status: Home / Self Care | Payer: Medicare Other | Source: Ambulatory Visit | Attending: Radiation Oncology | Admitting: Radiation Oncology

## 2022-02-12 ENCOUNTER — Other Ambulatory Visit: Payer: Self-pay

## 2022-02-12 DIAGNOSIS — Z510 Encounter for antineoplastic radiation therapy: Secondary | ICD-10-CM | POA: Diagnosis not present

## 2022-02-12 DIAGNOSIS — Z170 Estrogen receptor positive status [ER+]: Secondary | ICD-10-CM | POA: Diagnosis not present

## 2022-02-12 DIAGNOSIS — C50411 Malignant neoplasm of upper-outer quadrant of right female breast: Secondary | ICD-10-CM | POA: Diagnosis not present

## 2022-02-12 DIAGNOSIS — Z923 Personal history of irradiation: Secondary | ICD-10-CM | POA: Diagnosis not present

## 2022-02-12 LAB — RAD ONC ARIA SESSION SUMMARY
Course Elapsed Days: 3
Plan Fractions Treated to Date: 4
Plan Prescribed Dose Per Fraction: 6 Gy
Plan Total Fractions Prescribed: 5
Plan Total Prescribed Dose: 30 Gy
Reference Point Dosage Given to Date: 24 Gy
Reference Point Session Dosage Given: 6 Gy
Session Number: 4

## 2022-02-15 ENCOUNTER — Ambulatory Visit
Admission: RE | Admit: 2022-02-15 | Discharge: 2022-02-15 | Disposition: A | Discharge status: Home / Self Care | Payer: Medicare Other | Source: Ambulatory Visit | Attending: Radiation Oncology | Admitting: Radiation Oncology

## 2022-02-15 ENCOUNTER — Other Ambulatory Visit: Payer: Self-pay

## 2022-02-15 ENCOUNTER — Inpatient Hospital Stay: Payer: Medicare Other

## 2022-02-15 DIAGNOSIS — Z923 Personal history of irradiation: Secondary | ICD-10-CM | POA: Diagnosis not present

## 2022-02-15 DIAGNOSIS — C50411 Malignant neoplasm of upper-outer quadrant of right female breast: Secondary | ICD-10-CM | POA: Diagnosis not present

## 2022-02-15 DIAGNOSIS — Z510 Encounter for antineoplastic radiation therapy: Secondary | ICD-10-CM | POA: Diagnosis not present

## 2022-02-15 DIAGNOSIS — Z170 Estrogen receptor positive status [ER+]: Secondary | ICD-10-CM | POA: Diagnosis not present

## 2022-02-15 LAB — RAD ONC ARIA SESSION SUMMARY
Course Elapsed Days: 6
Plan Fractions Treated to Date: 5
Plan Prescribed Dose Per Fraction: 6 Gy
Plan Total Fractions Prescribed: 5
Plan Total Prescribed Dose: 30 Gy
Reference Point Dosage Given to Date: 30 Gy
Reference Point Session Dosage Given: 6 Gy
Session Number: 5

## 2022-02-16 ENCOUNTER — Ambulatory Visit: Payer: Medicare Other

## 2022-02-17 ENCOUNTER — Ambulatory Visit
Admission: RE | Admit: 2022-02-17 | Discharge: 2022-02-17 | Disposition: A | Discharge status: Home / Self Care | Payer: Medicare Other | Source: Ambulatory Visit | Attending: Surgery | Admitting: Surgery

## 2022-02-17 ENCOUNTER — Ambulatory Visit: Payer: Medicare Other

## 2022-02-17 DIAGNOSIS — N6489 Other specified disorders of breast: Secondary | ICD-10-CM | POA: Diagnosis not present

## 2022-02-17 DIAGNOSIS — N611 Abscess of the breast and nipple: Secondary | ICD-10-CM | POA: Insufficient documentation

## 2022-02-17 DIAGNOSIS — L7682 Other postprocedural complications of skin and subcutaneous tissue: Secondary | ICD-10-CM | POA: Diagnosis not present

## 2022-02-17 DIAGNOSIS — R928 Other abnormal and inconclusive findings on diagnostic imaging of breast: Secondary | ICD-10-CM | POA: Diagnosis not present

## 2022-02-17 DIAGNOSIS — Z1231 Encounter for screening mammogram for malignant neoplasm of breast: Secondary | ICD-10-CM | POA: Insufficient documentation

## 2022-02-17 HISTORY — DX: Personal history of irradiation: Z92.3

## 2022-02-17 MED ORDER — LIDOCAINE HCL (PF) 1 % IJ SOLN
2.0000 mL | Freq: Once | INTRAMUSCULAR | Status: AC
  Administered 2022-02-17: 2 mL via INTRADERMAL

## 2022-02-18 ENCOUNTER — Ambulatory Visit: Payer: Medicare Other

## 2022-02-19 ENCOUNTER — Ambulatory Visit: Payer: Medicare Other

## 2022-02-19 ENCOUNTER — Encounter: Payer: Self-pay | Admitting: *Deleted

## 2022-02-22 ENCOUNTER — Ambulatory Visit: Payer: Medicare Other

## 2022-03-16 ENCOUNTER — Ambulatory Visit
Admission: RE | Admit: 2022-03-16 | Discharge: 2022-03-16 | Disposition: A | Discharge status: Home / Self Care | Payer: Medicare Other | Source: Ambulatory Visit | Attending: Oncology | Admitting: Oncology

## 2022-03-16 DIAGNOSIS — C50919 Malignant neoplasm of unspecified site of unspecified female breast: Secondary | ICD-10-CM

## 2022-03-16 DIAGNOSIS — M8589 Other specified disorders of bone density and structure, multiple sites: Secondary | ICD-10-CM | POA: Diagnosis not present

## 2022-03-16 DIAGNOSIS — F17210 Nicotine dependence, cigarettes, uncomplicated: Secondary | ICD-10-CM | POA: Insufficient documentation

## 2022-03-16 DIAGNOSIS — Z780 Asymptomatic menopausal state: Secondary | ICD-10-CM | POA: Insufficient documentation

## 2022-03-16 DIAGNOSIS — M85851 Other specified disorders of bone density and structure, right thigh: Secondary | ICD-10-CM | POA: Diagnosis not present

## 2022-03-16 DIAGNOSIS — M85831 Other specified disorders of bone density and structure, right forearm: Secondary | ICD-10-CM | POA: Diagnosis not present

## 2022-03-22 ENCOUNTER — Encounter: Payer: Self-pay | Admitting: Radiation Oncology

## 2022-03-22 ENCOUNTER — Ambulatory Visit
Admission: RE | Admit: 2022-03-22 | Discharge: 2022-03-22 | Disposition: A | Discharge status: Home / Self Care | Payer: Medicare Other | Source: Ambulatory Visit | Attending: Radiation Oncology | Admitting: Radiation Oncology

## 2022-03-22 VITALS — BP 105/78 | HR 86 | Temp 98.0°F | Resp 16 | Ht 65.0 in | Wt 195.0 lb

## 2022-03-22 DIAGNOSIS — C50911 Malignant neoplasm of unspecified site of right female breast: Secondary | ICD-10-CM | POA: Diagnosis not present

## 2022-03-22 NOTE — Progress Notes (Signed)
Radiation Oncology Follow up Note  Name: Chelsey [name]   Date:   03/22/2022 MRN:  [id number] DOB: 27-Nov-1950    This 72 y.o. female presents to the clinic today for 1 month follow-up status post whole breast radiation to her right breast for a T1c ER/PR positive papillary carcinoma with invasion status post wide local excision.  REFERRING PROVIDER: Valerie Roys, DO  HPI: Patient is a 72 year old female now out 1 month having completed whole breast radiation to her right breast status post wide local excision for T1c ER/PR positive papillary carcinoma with invasion.  Seen today in follow-up she is doing fairly well she still feels the breast is somewhat solid.  She does have surgical changes with the secondary to the horizontal nature of her incision has drawn the breast superiorly.  She otherwise specifically denies cough or bone pain..  COMPLICATIONS OF TREATMENT: none  FOLLOW UP COMPLIANCE: keeps appointments   PHYSICAL EXAM:  BP 105/78   Pulse 86   Temp 98 F (36.7 C)   Resp 16   Ht '5\' 5"'$  (1.651 m)   Wt 195 lb (88.5 kg)   LMP  (LMP Unknown)   BMI 32.45 kg/m  Right breast is retracted towards the horizontal incision superiorly.  It is firm no dominant masses noted in either breast no axillary or supraclavicular adenopathy is appreciated.  Cosmetic result is only fair.  Well-developed well-nourished patient in NAD. HEENT reveals PERLA, EOMI, discs not visualized.  Oral cavity is clear. No oral mucosal lesions are identified. Neck is clear without evidence of cervical or supraclavicular adenopathy. Lungs are clear to A&P. Cardiac examination is essentially unremarkable with regular rate and rhythm without murmur rub or thrill. Abdomen is benign with no organomegaly or masses noted. Motor sensory and DTR levels are equal and symmetric in the upper and lower extremities. Cranial nerves II through XII are grossly intact. Proprioception is intact. No peripheral adenopathy or edema is  identified. No motor or sensory levels are noted. Crude visual fields are within normal range.  RADIOLOGY RESULTS: No current films for review  PLAN: Present time patient is healing well and recovering well from radiation therapy.  Cosmetic result is fair.  She has been started on Femara tolerating it well without side effect.  I asked to see her back in 6 months for follow-up.  Patient is to call with any concerns.  I would like to take this opportunity to thank you for allowing me to participate in the care of your patient.Noreene Filbert, MD

## 2022-04-13 ENCOUNTER — Inpatient Hospital Stay: Payer: Medicare Other | Attending: Oncology | Admitting: Oncology

## 2022-04-13 ENCOUNTER — Encounter: Payer: Self-pay | Admitting: Oncology

## 2022-04-13 VITALS — BP 126/91 | HR 91 | Temp 97.3°F | Resp 16 | Ht 65.0 in | Wt 199.1 lb

## 2022-04-13 DIAGNOSIS — Z79811 Long term (current) use of aromatase inhibitors: Secondary | ICD-10-CM | POA: Diagnosis not present

## 2022-04-13 DIAGNOSIS — M8580 Other specified disorders of bone density and structure, unspecified site: Secondary | ICD-10-CM | POA: Insufficient documentation

## 2022-04-13 DIAGNOSIS — Z170 Estrogen receptor positive status [ER+]: Secondary | ICD-10-CM | POA: Diagnosis not present

## 2022-04-13 DIAGNOSIS — F17210 Nicotine dependence, cigarettes, uncomplicated: Secondary | ICD-10-CM | POA: Insufficient documentation

## 2022-04-13 DIAGNOSIS — C50911 Malignant neoplasm of unspecified site of right female breast: Secondary | ICD-10-CM | POA: Diagnosis not present

## 2022-04-13 DIAGNOSIS — Z923 Personal history of irradiation: Secondary | ICD-10-CM | POA: Diagnosis not present

## 2022-04-13 DIAGNOSIS — C50919 Malignant neoplasm of unspecified site of unspecified female breast: Secondary | ICD-10-CM

## 2022-04-13 DIAGNOSIS — Z79899 Other long term (current) drug therapy: Secondary | ICD-10-CM | POA: Diagnosis not present

## 2022-04-13 NOTE — Progress Notes (Signed)
[place]  [phone] [phone]  ID: CIIN BRAZZEL OB: 1950-08-09  MR#: [id number]  RCV#:[id number]  Patient Care Team: Valerie Roys, DO as PCP - General (Family Medicine) Christene Lye, MD (General Surgery) Daiva Huge, RN as Oncology Nurse Navigator  CHIEF COMPLAINT: Pathologic stage Ia ER/PR positive, HER2 negative invasive carcinoma of the right breast.  Oncotype Dx 8.  INTERVAL HISTORY: Patient returns to clinic today for routine 62-month evaluation and to assess her toleration of letrozole.  She completed her XRT.  She is tolerating letrozole well without significant side effects.  She currently feels well and is asymptomatic. She has no neurologic complaints.  She denies any recent fevers or illnesses.  She has a good appetite and denies weight loss.  She has no chest pain, shortness of breath, cough, or hemoptysis.  She denies any nausea, vomiting, constipation, or diarrhea.  She has no urinary complaints.  Patient offers no specific complaints today.    REVIEW OF SYSTEMS:   Review of Systems  Constitutional: Negative.  Negative for fever, malaise/fatigue and weight loss.  Respiratory: Negative.  Negative for cough and shortness of breath.   Cardiovascular: Negative.  Negative for chest pain and leg swelling.  Gastrointestinal: Negative.  Negative for abdominal pain.  Genitourinary: Negative.  Negative for dysuria.  Musculoskeletal: Negative.  Negative for back pain.  Skin: Negative.  Negative for rash.  Neurological: Negative.  Negative for dizziness, focal weakness, weakness and headaches.  Psychiatric/Behavioral: Negative.  The patient is not nervous/anxious.     As per HPI. Otherwise, a complete review of systems is negative.  PAST MEDICAL HISTORY: Past Medical History:  Diagnosis Date   Aortic atherosclerosis ([place])    Breast cancer ([place]) 1997   right breast, radiation   Bronchitis    recent/ 06/09/15 had chest  xray/ [facility]/resolved   Cancer [facility]) 1997   right lumpectomy,L/Ad/R    GERD (gastroesophageal reflux disease)    History of hiatal hernia    Hypercholesteremia    Hyperlipidemia    Low BP    TYPICALLY RUNS 80'S/60'S   Osteopenia    Panic attack    Paraesophageal hernia    Personal history of malignant neoplasm of breast    Personal history of radiation therapy     PAST SURGICAL HISTORY: Past Surgical History:  Procedure Laterality Date   BICEPT TENODESIS Left 11/23/2016   Procedure: BICEPS TENODESIS;  Surgeon: Leim Fabry, MD;  Location: [facility];  Service: Orthopedics;  Laterality: Left;   BREAST BIOPSY Right 12/21/2021   MM RT RADIO FREQUENCY TAG LOC MAMMO GUIDE 12/21/2021 [facility]   BREAST EXCISIONAL BIOPSY Right 1997   pos   BREAST LUMPECTOMY Right 12/25/2021   breast cancer   BREAST SURGERY Right 1997   lumpectomy   CHOLECYSTECTOMY N/A 08/06/2016   Procedure: LAPAROSCOPIC CHOLECYSTECTOMY WITH INTRAOPERATIVE CHOLANGIOGRAM;  Surgeon: Christene Lye, MD;  Location: [facility];  Service: General;  Laterality: N/A;   COLONOSCOPY  2008   COLONOSCOPY WITH PROPOFOL N/A 07/21/2015   Procedure: COLONOSCOPY WITH PROPOFOL;  Surgeon: Lucilla Lame, MD;  Location: [place];  Service: Endoscopy;  Laterality: N/A;   COLONOSCOPY WITH PROPOFOL N/A 01/05/2018   Procedure: COLONOSCOPY WITH PROPOFOL;  Surgeon: Jonathon Bellows, MD;  Location[facility] ENDOSCOPY;  Service: Gastroenterology;  Laterality: N/A;   ESOPHAGOGASTRODUODENOSCOPY (EGD) WITH PROPOFOL N/A 06/16/2016   Procedure: ESOPHAGOGASTRODUODENOSCOPY (EGD) WITH PROPOFOL;  Surgeon: Christene Lye, MD;  Location: [facility] ENDOSCOPY;  Service: Endoscopy;  Laterality: N/A;   ESOPHAGOGASTRODUODENOSCOPY (EGD) WITH PROPOFOL N/A 01/05/2018   Procedure: ESOPHAGOGASTRODUODENOSCOPY (EGD) WITH PROPOFOL;  Surgeon: Jonathon Bellows, MD;  Location[facility]COPY;  Service: Gastroenterology;  Laterality: N/A;    ESOPHAGOGASTRODUODENOSCOPY (EGD) WITH PROPOFOL N/A 06/13/2018   Procedure: ESOPHAGOGASTRODUODENOSCOPY (EGD) WITH PROPOFOL;  Surgeon: Jonathon Bellows, MD;  Location: [facility] ENDOSCOPY;  Service: Gastroenterology;  Laterality: N/A;   ESOPHAGOGASTRODUODENOSCOPY (EGD) WITH PROPOFOL N/A 07/16/2018   Procedure: ESOPHAGOGASTRODUODENOSCOPY (EGD) WITH PROPOFOL;  Surgeon: Lucilla Lame, MD;  Location: [facility] ENDOSCOPY;  Service: Endoscopy;  Laterality: N/A;   HARDWARE REMOVAL Left 12/27/2016   Procedure: HARDWARE REMOVAL LEFT  PROXIMAL HUMEROUS;  Surgeon: Leim Fabry, MD;  Location: [facility];  Service: Orthopedics;  Laterality: Left;   IR GJ TUBE CHANGE  [id number]   NISSEN FUNDOPLICATION N/A [id number]   Procedure: NISSEN FUNDOPLICATION;  Surgeon: Jules Husbands, MD;  Location: [facility];  Service: General;  Laterality: N/A;   ORIF HUMERUS FRACTURE Left 11/23/2016   Procedure: OPEN REDUCTION INTERNAL FIXATION (ORIF) PROXIMAL HUMERUS FRACTURE;  Surgeon: Leim Fabry, MD;  Location: [facility];  Service: Orthopedics;  Laterality: Left;   PART MASTECTOMY,RADIO FREQUENCY LOCALIZER,AXILLARY SENTINEL NODE BIOPSY Right 12/25/2021   Procedure: PART MASTECTOMY,RADIO FREQUENCY LOCALIZER,AXILLARY SENTINEL NODE BIOPSY;  Surgeon: Benjamine Sprague, DO;  Location: [facility];  Service: General;  Laterality: Right;   REPAIR OF ESOPHAGUS  03/09/2018   Procedure: REPAIR OF ESOPHAGUS;  Surgeon: Jules Husbands, MD;  Location: [facility];  Service: General;;   REVERSE SHOULDER ARTHROPLASTY Left 12/27/2016   Procedure: REVERSE SHOULDER ARTHROPLASTY;  Surgeon: Leim Fabry, MD;  Location: [facility];  Service: Orthopedics;  Laterality: Left;   ROBOTIC ASSISTED LAPAROSCOPIC REPAIR OF PARAESOPHAGEAL HERNIA N/A 03/09/2018   Procedure: ROBOTIC HIATAL HERNIA CONVERTED TO OPEN;  Surgeon: Jules Husbands, MD;  Location: [facility];  Service: General;  Laterality: N/A;    FAMILY HISTORY: Family History  Problem Relation Age of Onset   Kidney disease Mother     Cancer Father        stomach   Heart attack Father    Hypertension Father    Alcohol abuse Father    Anxiety disorder Brother    Obesity Brother    COPD Brother    Depression Brother    Anxiety disorder Brother    Cancer Maternal Grandmother    Breast cancer Maternal Grandmother     ADVANCED DIRECTIVES (Y/N):  N  HEALTH MAINTENANCE: Social History   Tobacco Use   Smoking status: Every Day    Packs/day: 0.10    Years: 10.00    Additional pack years: 0.00    Total pack years: 1.00    Types: Cigarettes    Start date: 11/25/2009   Smokeless tobacco: Never   Tobacco comments:    1-2 cigarette daily  Vaping Use   Vaping Use: Never used  Substance Use Topics   Alcohol use: No    Alcohol/week: 0.0 standard drinks of alcohol   Drug use: No     Colonoscopy:  PAP:  Bone density:  Lipid panel:  No Known Allergies  Current Outpatient Medications  Medication Sig Dispense Refill   atorvastatin (LIPITOR) 20 MG tablet Take 1 tablet (20 mg total) by mouth daily. 90 tablet 1   buPROPion (WELLBUTRIN XL) 300 MG 24 hr tablet One tab Once a day 90 tablet 1   busPIRone (BUSPAR) 10 MG tablet Take 1 tablet (10 mg total) by mouth 2 (two) times daily. 180 tablet 1   letrozole [facility])  2.5 MG tablet Take 1 tablet (2.5 mg total) by mouth daily. 90 tablet 3   pantoprazole (PROTONIX) 40 MG tablet Take 1 tablet (40 mg total) by mouth daily. 90 tablet 3   trazodone (DESYREL) 300 MG tablet TAKE 1 TABLET(300 MG) BY MOUTH AT BEDTIME 90 tablet 1   No current facility-administered medications for this visit.   Facility-Administered Medications Ordered in Other Visits  Medication Dose Route Frequency Provider Last Rate Last Admin   lidocaine (PF) (XYLOCAINE) 1 % injection 10 mL  10 mL Other Once Burke, Isami, DO        OBJECTIVE: Vitals:   04/13/22 1013  BP: (!) 126/91  Pulse: 91  Resp: 16  Temp: (!) 97.3 F (36.3 C)  SpO2: 99%     Body mass index is 33.13 kg/m.    ECOG FS:0 -  Asymptomatic  General: Well-developed, well-nourished, no acute distress. Eyes: Pink conjunctiva, anicteric sclera. HEENT: Normocephalic, moist mucous membranes. Breast: Exam deferred today. Lungs: No audible wheezing or coughing. Heart: Regular rate and rhythm. Abdomen: Soft, nontender, no obvious distention. Musculoskeletal: No edema, cyanosis, or clubbing. Neuro: Alert, answering all questions appropriately. Cranial nerves grossly intact. Skin: No rashes or petechiae noted. Psych: Normal affect.  LAB RESULTS:  Lab Results  Component Value Date   NA 140 01/26/2022   K 4.3 01/26/2022   CL 104 01/26/2022   CO2 23 01/26/2022   GLUCOSE 84 01/26/2022   BUN 14 01/26/2022   CREATININE 0.82 01/26/2022   CALCIUM 8.8 01/26/2022   PROT 6.3 01/26/2022   ALBUMIN 4.0 01/26/2022   AST 12 01/26/2022   ALT 9 01/26/2022   ALKPHOS 149 (H) 01/26/2022   BILITOT 0.4 01/26/2022   GFRNONAA >60 12/23/2021   GFRAA 93 01/21/2020    Lab Results  Component Value Date   WBC 5.6 02/10/2022   NEUTROABS 3.7 01/26/2022   HGB 13.7 02/10/2022   HCT 41.7 02/10/2022   MCV 92.1 02/10/2022   PLT 235 02/10/2022     STUDIES: DG Bone Density  Result Date: 03/16/2022 EXAM: DUAL X-RAY ABSORPTIOMETRY (DXA) FOR BONE MINERAL DENSITY IMPRESSION: Dear Dr. Grayland Ormond, Your patient MAUDENE ARSENAULT completed a FRAX assessment on 03/16/2022 using the [place] (analysis version: 14.10) manufactured by [employer]. The following summarizes the results of our evaluation. PATIENT BIOGRAPHICAL: Name: Juniper, Guitreau Patient ID: [id number] Birth Date: [birth year] Height:    65.0 in. Gender:     Female    [age]       Weight:    194.1 lbs. Ethnicity:  White                            Exam Date: 03/16/2022 FRAX* RESULTS:  (version: 3.5) 10-year Probability of Fracture1 Major Osteoporotic Fracture2 Hip Fracture 18.6% 5.5% Population: [place] (Caucasian) Risk Factors: History of Fracture (Adult), Tobacco  User (Current Smoker) Based on Femur (Right) Neck BMD 1 -The 10-year probability of fracture may be lower than reported if the patient has received treatment. 2 -Major Osteoporotic Fracture: Clinical Spine, Forearm, Hip or Shoulder *FRAX is a Materials engineer of the [employer] for Metabolic Bone Disease, a [place] (WHO) [facility]. ASSESSMENT: The probability of a major osteoporotic fracture is 18.6% within the next ten years. The probability of a hip fracture is 5.5% within the next ten years. Your patient Landis Beamon completed a BMD  test on 03/16/2022 using the [place] (software version: 14.10) manufactured by [employer]. The following summarizes the results of our evaluation. Technologist: [facility] PATIENT BIOGRAPHICAL: Name: Terrance, Dorf Patient ID: [id number] Birth Date: [birth year] Height: 65.0 in. Gender: Female Exam Date: 03/16/2022 Weight: 194.1 lbs. Indications: Advanced Age, Caucasian, Height Loss, High Risk Meds, History of Breast Cancer, History of Chemo, History of Fracture (Adult), History of Radiation, Postmenopausal, Tobacco User (Current Smoker) Fractures: Shoulder Treatments: Letrozole DENSITOMETRY RESULTS: Site          Region     Measured Date Measured Age WHO Classification Young Adult T-score BMD         %Change vs. Previous Significant Change (*) DualFemur Neck Right 03/16/2022 70.6 Osteopenia -2.0 0.764 g/cm2 -6.9% Yes DualFemur Neck Right 08/09/2017 66.0 Osteopenia -1.6 0.821 g/cm2 - - DualFemur Total Mean 03/16/2022 70.6 Osteopenia -1.6 0.802 g/cm2 -10.6% Yes DualFemur Total Mean 08/09/2017 66.0 Normal -0.9 0.897 g/cm2 - - Right Forearm Radius 33% 03/16/2022 70.6 Osteopenia -1.5 0.746 g/cm2 -4.5% Yes Right Forearm Radius 33% 08/09/2017 66.0 Osteopenia -1.1 0.781 g/cm2 - - ASSESSMENT: The BMD measured at Femur Neck Right is 0.764 g/cm2 with a T-score of -2.0. This patient is considered osteopenic  according to [facility]) criteria. The scan quality is good. Lumbar spine was not utilized due to advanced degenerative changes. Compared with prior study, there has been significant decrease in the total hip. World Pharmacologist [facility]) criteria for post-menopausal, Caucasian Women: Normal:                   T-score at or above -1 SD Osteopenia/low bone mass: T-score between -1 and -2.5 SD Osteoporosis:             T-score at or below -2.5 SD RECOMMENDATIONS: 1. All patients should optimize calcium and vitamin D intake. 2. Consider FDA-approved medical therapies in postmenopausal women and men aged 34 years and older, based on the following: a. A hip or vertebral(clinical or morphometric) fracture b. T-score < -2.5 at the femoral neck or spine after appropriate evaluation to exclude secondary causes c. Low bone mass (T-score between -1.0 and -2.5 at the femoral neck or spine) and a 10-year probability of a hip fracture > 3% or a 10-year probability of a major osteoporosis-related fracture > 20% based on the [place]-adapted WHO algorithm 3. Clinician judgment and/or patient preferences may indicate treatment for people with 10-year fracture probabilities above or below these levels FOLLOW-UP: People with diagnosed cases of osteoporosis or at high risk for fracture should have regular bone mineral density tests. For patients eligible for Medicare, routine testing is allowed once every 2 years. The testing frequency can be increased to one year for patients who have rapidly progressing disease, those who are receiving or discontinuing medical therapy to restore bone mass, or have additional risk factors. I have reviewed this report, and agree with the above findings. [facility] Electronically Signed   By: Ammie Ferrier M.D.   On: 03/16/2022 09:25    ASSESSMENT: Pathologic stage Ia ER/PR positive, HER2 negative invasive carcinoma of the right breast.  Oncotype Dx 8  PLAN:     Pathologic stage Ia ER/PR positive, HER2 negative invasive carcinoma of the right breast: Patient has a distant history of right breast cancer in 1997.  She reports she underwent lumpectomy and XRT at that time.  She did not have chemotherapy or hormonal therapy.  Patient underwent lumpectomy on December 25, 2021 confirming stage of disease.  Oncotype Dx score was 8 which is considered low risk therefore no adjuvant chemotherapy was necessary.  Patient completed 10 fractions of partial breast irradiation in January 2024.  Continue letrozole for a total of 5 years completing in January 2029.  Return to clinic in 6 months for routine evaluation.  Patient will require repeat mammogram in September 2024.  Osteopenia: Bone mineral density on March 16, 2022 reported T-score of -2.0.  Recommended patient initiate calcium and vitamin D supplementation.  Repeat in February 2025.   Patient expressed understanding and was in agreement with this plan. She also understands that She can call clinic at any time with any questions, concerns, or complaints.    Cancer Staging  Invasive ductal carcinoma of right breast in female [facility]) Staging form: Breast, AJCC 8th Edition - Clinical stage from 12/02/2021: Stage IA (cT1c, cN0, cM0, G2, ER+, PR+, HER2-) - Signed by Lloyd Huger, MD on 12/02/2021 Stage prefix: Initial diagnosis Histologic grading system: 3 grade system   Lloyd Huger, MD   04/13/2022 11:20 AM

## 2022-04-14 ENCOUNTER — Other Ambulatory Visit: Payer: Self-pay | Admitting: Oncology

## 2022-04-14 DIAGNOSIS — C50919 Malignant neoplasm of unspecified site of unspecified female breast: Secondary | ICD-10-CM

## 2022-07-27 ENCOUNTER — Encounter: Payer: Self-pay | Admitting: Family Medicine

## 2022-07-27 ENCOUNTER — Ambulatory Visit (INDEPENDENT_AMBULATORY_CARE_PROVIDER_SITE_OTHER): Payer: Medicare Other | Admitting: Family Medicine

## 2022-07-27 VITALS — BP 126/86 | HR 81 | Temp 98.0°F | Wt 208.6 lb

## 2022-07-27 DIAGNOSIS — R0609 Other forms of dyspnea: Secondary | ICD-10-CM

## 2022-07-27 DIAGNOSIS — F332 Major depressive disorder, recurrent severe without psychotic features: Secondary | ICD-10-CM

## 2022-07-27 DIAGNOSIS — B372 Candidiasis of skin and nail: Secondary | ICD-10-CM

## 2022-07-27 DIAGNOSIS — D508 Other iron deficiency anemias: Secondary | ICD-10-CM | POA: Diagnosis not present

## 2022-07-27 DIAGNOSIS — D692 Other nonthrombocytopenic purpura: Secondary | ICD-10-CM | POA: Diagnosis not present

## 2022-07-27 DIAGNOSIS — I700 Atherosclerosis of aorta: Secondary | ICD-10-CM

## 2022-07-27 DIAGNOSIS — E785 Hyperlipidemia, unspecified: Secondary | ICD-10-CM

## 2022-07-27 MED ORDER — BUPROPION HCL ER (XL) 300 MG PO TB24: ORAL_TABLET | ORAL | 1 refills | Status: DC

## 2022-07-27 MED ORDER — ATORVASTATIN CALCIUM 20 MG PO TABS: 20.0000 mg | ORAL_TABLET | Freq: Every day | ORAL | 1 refills | Status: DC

## 2022-07-27 MED ORDER — TRAZODONE HCL 300 MG PO TABS: ORAL_TABLET | ORAL | 1 refills | Status: DC

## 2022-07-27 MED ORDER — NYSTATIN 100000 UNIT/GM EX POWD: 1.0000 | Freq: Three times a day (TID) | CUTANEOUS | 0 refills | Status: DC

## 2022-07-27 MED ORDER — PANTOPRAZOLE SODIUM 40 MG PO TBEC: 40.0000 mg | DELAYED_RELEASE_TABLET | Freq: Every day | ORAL | 3 refills | Status: DC

## 2022-07-27 MED ORDER — NYSTATIN 100000 UNIT/GM EX OINT: 1.0000 | TOPICAL_OINTMENT | Freq: Two times a day (BID) | CUTANEOUS | 0 refills | Status: DC

## 2022-07-27 MED ORDER — BUSPIRONE HCL 10 MG PO TABS: 10.0000 mg | ORAL_TABLET | Freq: Two times a day (BID) | ORAL | 1 refills | Status: DC

## 2022-07-27 NOTE — Assessment & Plan Note (Signed)
Rechecking labs today. Await results. Treat as needed.  °

## 2022-07-27 NOTE — Assessment & Plan Note (Signed)
Will keep her BP and cholesterol under good control. Continue to monitor. Call with any concerns.  

## 2022-07-27 NOTE — Assessment & Plan Note (Signed)
Reassured patient. Continue to monitor.  

## 2022-07-27 NOTE — Progress Notes (Signed)
BP 126/86   Pulse 81   Temp 98 F (36.7 C) (Oral)   Wt 208 lb 9.6 oz (94.6 kg)   LMP  (LMP Unknown)   SpO2 98%   BMI 34.71 kg/m    Subjective:    Patient ID: Chelsey [name], female    DOB: 1950-05-20, 72 y.o.   MRN: [id number]  HPI: Chelsey [name] is a 72 y.o. female  Chief Complaint  Patient presents with   Hyperlipidemia   Depression   Anxiety   SHORTNESS OF BREATH Duration: months Onset: gradual Description of breathing discomfort: can't catch her breath Severity: moderate Episode duration: minutes  Frequency: occasionally when bringing garbage cans up Related to exertion: yes Cough: no Chest tightness: no Wheezing: no Fevers: no Chest pain: no Palpitations: no  Nausea: no Diaphoresis: no Deconditioning: yes Status: fluctuating  HYPERLIPIDEMIA Hyperlipidemia status: excellent compliance Satisfied with current treatment?  yes Side effects:  no Medication compliance: excellent compliance Past cholesterol meds: atorvastatin Supplements: none Aspirin:  no The 10-year ASCVD risk score (Arnett DK, et al., 2019) is: 15.6%   Values used to calculate the score:     Age: 38 years     Sex: Female     Is Non-Hispanic African American: No     Diabetic: No     Tobacco smoker: Yes     Systolic Blood Pressure: 126 mmHg     Is BP treated: No     HDL Cholesterol: 52 mg/dL     Total Cholesterol: 177 mg/dL Chest pain:  no  DEPRESSION Mood status: controlled Satisfied with current treatment?: yes Symptom severity: mild  Duration of current treatment : chronic Side effects: no Medication compliance: excellent compliance Psychotherapy/counseling: no  Previous psychiatric medications: wellbutrin Depressed mood: no Anxious mood: no Anhedonia: no Significant weight loss or gain: no Insomnia: no  Fatigue: yes Feelings of worthlessness or guilt: no Impaired concentration/indecisiveness: no Suicidal ideations: no Hopelessness: no Crying spells: no     07/27/2022   10:03 AM 01/26/2022   12:03 PM 11/03/2021   11:39 AM 10/09/2021   10:19 AM 09/25/2021   11:07 AM  Depression screen PHQ 2/9  Decreased Interest 0 0 0 0 0  Down, Depressed, Hopeless 0 0 0 0 0  PHQ - 2 Score 0 0 0 0 0  Altered sleeping 0 0 0 0 0  Tired, decreased energy 1 1 0 1 0  Change in appetite 0 0 0 0 0  Feeling bad or failure about yourself  0 0 0 0 0  Trouble concentrating 0 0 0 0 0  Moving slowly or fidgety/restless 0 0 0 0 0  Suicidal thoughts 0 0  0 0  PHQ-9 Score 1 1 0 1 0  Difficult doing work/chores Not difficult at all Not difficult at all Not difficult at all Not difficult at all Not difficult at all     Relevant past medical, surgical, family and social history reviewed and updated as indicated. Interim medical history since our last visit reviewed. Allergies and medications reviewed and updated.  Review of Systems  Constitutional: Negative.   HENT: Negative.    Respiratory:  Positive for shortness of breath. Negative for apnea, cough, choking, chest tightness, wheezing and stridor.   Cardiovascular: Negative.   Gastrointestinal: Negative.   Musculoskeletal: Negative.   Neurological: Negative.   Psychiatric/Behavioral: Negative.      Per HPI unless specifically indicated above     Objective:    BP 126/86  Pulse 81   Temp 98 F (36.7 C) (Oral)   Wt 208 lb 9.6 oz (94.6 kg)   LMP  (LMP Unknown)   SpO2 98%   BMI 34.71 kg/m   Wt Readings from Last 3 Encounters:  07/27/22 208 lb 9.6 oz (94.6 kg)  04/13/22 199 lb 1.6 oz (90.3 kg)  03/22/22 195 lb (88.5 kg)    Physical Exam Vitals and nursing note reviewed.  Constitutional:      General: She is not in acute distress.    Appearance: Normal appearance. She is not ill-appearing, toxic-appearing or diaphoretic.  HENT:     Head: Normocephalic and atraumatic.     Right Ear: External ear normal.     Left Ear: External ear normal.     Nose: Nose normal.     Mouth/Throat:     Mouth: Mucous  membranes are moist.     Pharynx: Oropharynx is clear.  Eyes:     General: No scleral icterus.       Right eye: No discharge.        Left eye: No discharge.     Extraocular Movements: Extraocular movements intact.     Conjunctiva/sclera: Conjunctivae normal.     Pupils: Pupils are equal, round, and reactive to light.  Cardiovascular:     Rate and Rhythm: Normal rate and regular rhythm.     Pulses: Normal pulses.     Heart sounds: Normal heart sounds. No murmur heard.    No friction rub. No gallop.  Pulmonary:     Effort: Pulmonary effort is normal. No respiratory distress.     Breath sounds: Normal breath sounds. No stridor. No wheezing, rhonchi or rales.  Chest:     Chest wall: No tenderness.  Musculoskeletal:        General: Normal range of motion.     Cervical back: Normal range of motion and neck supple.  Skin:    General: Skin is warm and dry.     Capillary Refill: Capillary refill takes less than 2 seconds.     Coloration: Skin is not jaundiced or pale.     Findings: Erythema (under breasts) present. No bruising, lesion or rash.  Neurological:     General: No focal deficit present.     Mental Status: She is alert and oriented to person, place, and time. Mental status is at baseline.  Psychiatric:        Mood and Affect: Mood normal.        Behavior: Behavior normal.        Thought Content: Thought content normal.        Judgment: Judgment normal.     Results for orders placed or performed in visit on 02/15/22  Rad Onc [facility] Session Summary  Result Value Ref Range   Course ID [id number]    Course Intent Unknown    Course Start Date 01/20/2022  2:48 PM    Session Number 5    Course First Treatment Date 02/09/2022 10:44 AM    Course Last Treatment Date 02/15/2022 11:02 AM    Course Elapsed Days 6    Reference Point ID Rt Breast DP    Reference Point Dosage Given to Date 30 Gy   Reference Point Session Dosage Given 6 Gy   Plan ID Breast_R_PBI    Plan Fractions  Treated to Date 5    Plan Total Fractions Prescribed 5    Plan Prescribed Dose Per Fraction 6 Gy   Plan Total Prescribed  Dose 30.000000 Gy   Plan Primary Reference Point Rt Breast DP       Assessment & Plan:   Problem List Items Addressed This Visit       Cardiovascular and Mediastinum   Aortic atherosclerosis (HCC)    Will keep her BP and cholesterol under good control. Continue to monitor. Call with any concerns.       Relevant Medications   atorvastatin (LIPITOR) 20 MG tablet   Other Relevant Orders   Comprehensive metabolic panel   Senile purpura (HCC)    Reassured patient. Continue to monitor.       Relevant Medications   atorvastatin (LIPITOR) 20 MG tablet   Other Relevant Orders   Comprehensive metabolic panel     Other   Anemia    Rechecking labs today. Await results. Treat as needed.       Relevant Orders   CBC with Differential/Platelet   Comprehensive metabolic panel   Major depressive disorder, recurrent severe without psychotic features (HCC)    Under good control on current regimen. Continue current regimen. Continue to monitor. Call with any concerns. Refills given.        Relevant Medications   buPROPion (WELLBUTRIN XL) 300 MG 24 hr tablet   busPIRone (BUSPAR) 10 MG tablet   trazodone (DESYREL) 300 MG tablet   Other Relevant Orders   Comprehensive metabolic panel   Hyperlipidemia    Under good control on current regimen. Continue current regimen. Continue to monitor. Call with any concerns. Refills given. Labs drawn today.       Relevant Medications   atorvastatin (LIPITOR) 20 MG tablet   Other Relevant Orders   Comprehensive metabolic panel   Lipid Panel w/o Chol/HDL Ratio   Other Visit Diagnoses     DOE (dyspnea on exertion)    -  Primary   EKG normal. Offered referral to cardiology- she would like to hold off at this time. Checking labs. Call if not getting better or getting worse.   Relevant Orders   EKG 12-Lead (Completed)    Candidal intertrigo       Willt reat with nystatin. Call with any concerns.   Relevant Medications   nystatin (MYCOSTATIN/NYSTOP) powder   nystatin ointment (MYCOSTATIN)        Follow up plan: Return in about 6 months (around 01/27/2023) for PHYSICAL.

## 2022-07-27 NOTE — Assessment & Plan Note (Signed)
Under good control on current regimen. Continue current regimen. Continue to monitor. Call with any concerns. Refills given. Labs drawn today.   

## 2022-07-27 NOTE — Assessment & Plan Note (Signed)
Under good control on current regimen. Continue current regimen. Continue to monitor. Call with any concerns. Refills given.   

## 2022-07-28 ENCOUNTER — Encounter: Payer: Self-pay | Admitting: Family Medicine

## 2022-07-28 LAB — CBC WITH DIFFERENTIAL/PLATELET
Basophils Absolute: 0 10*3/uL (ref 0.0–0.2)
Basos: 1 %
EOS (ABSOLUTE): 0.1 10*3/uL (ref 0.0–0.4)
Eos: 1 %
Hematocrit: 41 % (ref 34.0–46.6)
Hemoglobin: 13.4 g/dL (ref 11.1–15.9)
Immature Grans (Abs): 0 10*3/uL (ref 0.0–0.1)
Immature Granulocytes: 0 %
Lymphocytes Absolute: 1 10*3/uL (ref 0.7–3.1)
Lymphs: 19 %
MCH: 30.1 pg (ref 26.6–33.0)
MCHC: 32.7 g/dL (ref 31.5–35.7)
MCV: 92 fL (ref 79–97)
Monocytes Absolute: 0.3 10*3/uL (ref 0.1–0.9)
Monocytes: 7 %
Neutrophils Absolute: 3.7 10*3/uL (ref 1.4–7.0)
Neutrophils: 72 %
Platelets: 217 10*3/uL (ref 150–450)
RBC: 4.45 x10E6/uL (ref 3.77–5.28)
RDW: 13.4 % (ref 11.7–15.4)
WBC: 5.1 10*3/uL (ref 3.4–10.8)

## 2022-07-28 LAB — COMPREHENSIVE METABOLIC PANEL
ALT: 14 IU/L (ref 0–32)
AST: 14 IU/L (ref 0–40)
Albumin: 4 g/dL (ref 3.8–4.8)
Alkaline Phosphatase: 124 IU/L — ABNORMAL HIGH (ref 44–121)
BUN/Creatinine Ratio: 18 (ref 12–28)
BUN: 16 mg/dL (ref 8–27)
Bilirubin Total: 0.3 mg/dL (ref 0.0–1.2)
CO2: 21 mmol/L (ref 20–29)
Calcium: 9.2 mg/dL (ref 8.7–10.3)
Chloride: 104 mmol/L (ref 96–106)
Creatinine, Ser: 0.88 mg/dL (ref 0.57–1.00)
Globulin, Total: 2.4 g/dL (ref 1.5–4.5)
Glucose: 96 mg/dL (ref 70–99)
Potassium: 4.6 mmol/L (ref 3.5–5.2)
Sodium: 139 mmol/L (ref 134–144)
Total Protein: 6.4 g/dL (ref 6.0–8.5)
eGFR: 70 mL/min/{1.73_m2} (ref >59)

## 2022-07-28 LAB — LIPID PANEL W/O CHOL/HDL RATIO
Cholesterol, Total: 154 mg/dL (ref 100–199)
HDL: 52 mg/dL (ref >39)
LDL Chol Calc (NIH): 78 mg/dL (ref 0–99)
Triglycerides: 140 mg/dL (ref 0–149)
VLDL Cholesterol Cal: 24 mg/dL (ref 5–40)

## 2022-10-04 ENCOUNTER — Ambulatory Visit: Payer: Medicare Other | Admitting: Oncology

## 2022-10-04 ENCOUNTER — Ambulatory Visit: Payer: Medicare Other | Admitting: Radiation Oncology

## 2022-10-26 ENCOUNTER — Ambulatory Visit
Admission: RE | Admit: 2022-10-26 | Discharge: 2022-10-26 | Disposition: A | Discharge status: Home / Self Care | Payer: Medicare Other | Source: Ambulatory Visit | Attending: Oncology | Admitting: Oncology

## 2022-10-26 DIAGNOSIS — R9230 Dense breasts, unspecified: Secondary | ICD-10-CM | POA: Insufficient documentation

## 2022-10-26 DIAGNOSIS — Z853 Personal history of malignant neoplasm of breast: Secondary | ICD-10-CM | POA: Diagnosis not present

## 2022-10-26 DIAGNOSIS — C50919 Malignant neoplasm of unspecified site of unspecified female breast: Secondary | ICD-10-CM | POA: Insufficient documentation

## 2022-10-26 DIAGNOSIS — R92333 Mammographic heterogeneous density, bilateral breasts: Secondary | ICD-10-CM | POA: Diagnosis not present

## 2022-10-28 ENCOUNTER — Inpatient Hospital Stay: Payer: Medicare Other | Attending: Oncology | Admitting: Oncology

## 2022-10-28 ENCOUNTER — Encounter: Payer: Self-pay | Admitting: Oncology

## 2022-10-28 VITALS — BP 129/96 | HR 84 | Temp 97.4°F | Resp 16 | Ht 65.0 in | Wt 218.0 lb

## 2022-10-28 DIAGNOSIS — C50911 Malignant neoplasm of unspecified site of right female breast: Secondary | ICD-10-CM | POA: Insufficient documentation

## 2022-10-28 DIAGNOSIS — Z1732 Human epidermal growth factor receptor 2 negative status: Secondary | ICD-10-CM | POA: Insufficient documentation

## 2022-10-28 DIAGNOSIS — Z170 Estrogen receptor positive status [ER+]: Secondary | ICD-10-CM | POA: Diagnosis not present

## 2022-10-28 DIAGNOSIS — Z79811 Long term (current) use of aromatase inhibitors: Secondary | ICD-10-CM | POA: Diagnosis not present

## 2022-10-28 NOTE — Progress Notes (Signed)
Survivorship Care Plan visit completed.  Treatment summary reviewed and given to patient.  ASCO answers booklet reviewed and given to patient.  CARE program and Cancer Transitions discussed with patient along with other resources cancer center offers to patients and caregivers.  Patient verbalized understanding. 

## 2022-10-28 NOTE — Progress Notes (Signed)
[facility]  [phone]) [phone] Fax:(336) [phone]  ID: ADLENE WURZEL OB: 1950-04-06  MR#: [id number]  AOZ#:[id number]  Patient Care Team: Dorcas Carrow, DO as PCP - General (Family Medicine) Kieth Brightly, MD (General Surgery) Hulen Luster, RN as Oncology Nurse Navigator Carmina Miller, MD as Consulting Physician (Radiation Oncology) Jeralyn Ruths, MD as Consulting Physician (Oncology) [name], DO as Consulting Physician (General Surgery)  CHIEF COMPLAINT: Pathologic stage Ia ER/PR positive, HER2 negative invasive carcinoma of the right breast.  Oncotype Dx 8.  INTERVAL HISTORY: Patient returns clinic today for routine 63-month evaluation.  She currently feels well and is asymptomatic.  She is tolerating letrozole without significant side effects. She has no neurologic complaints.  She denies any recent fevers or illnesses.  She has a good appetite and denies weight loss.  She has no chest pain, shortness of breath, cough, or hemoptysis.  She denies any nausea, vomiting, constipation, or diarrhea.  She has no urinary complaints.  Patient offers no specific complaints today.  REVIEW OF SYSTEMS:   Review of Systems  Constitutional: Negative.  Negative for fever, malaise/fatigue and weight loss.  Respiratory: Negative.  Negative for cough and shortness of breath.   Cardiovascular: Negative.  Negative for chest pain and leg swelling.  Gastrointestinal: Negative.  Negative for abdominal pain.  Genitourinary: Negative.  Negative for dysuria.  Musculoskeletal: Negative.  Negative for back pain.  Skin: Negative.  Negative for rash.  Neurological: Negative.  Negative for dizziness, focal weakness, weakness and headaches.  Psychiatric/Behavioral: Negative.  The patient is not nervous/anxious.     As per HPI. Otherwise, a complete review of systems is negative.  PAST MEDICAL HISTORY: Past Medical History:  Diagnosis Date   Aortic  atherosclerosis (HCC)    Breast cancer (HCC) 1997   right breast, radiation   Bronchitis    recent/ 06/09/15 had chest xray[name]/resolved   Cancer [facility]) 1997   right lumpectomy,L/Ad/R    GERD (gastroesophageal reflux disease)    History of hiatal hernia    Hypercholesteremia    Hyperlipidemia    Low BP    TYPICALLY RUNS 80'S/60'S   Osteopenia    Panic attack    Paraesophageal hernia    Personal history of malignant neoplasm of breast    Personal history of radiation therapy     PAST SURGICAL HISTORY: Past Surgical History:  Procedure Laterality Date   BICEPT TENODESIS Left 11/23/2016   Procedure: BICEPS TENODESIS;  Surgeon: Signa Kell, MD;  Location: [facility];  Service: Orthopedics;  Laterality: Left;   BREAST BIOPSY Right 12/21/2021   MM RT RADIO FREQUENCY TAG LOC MAMMO GUIDE 12/21/2021 [facility]   BREAST EXCISIONAL BIOPSY Right 1997   pos   BREAST LUMPECTOMY Right 12/25/2021   breast cancer   BREAST SURGERY Right 1997   lumpectomy   CHOLECYSTECTOMY N/A 08/06/2016   Procedure: LAPAROSCOPIC CHOLECYSTECTOMY WITH INTRAOPERATIVE CHOLANGIOGRAM;  Surgeon: Kieth Brightly, MD;  Location: [facility];  Service: General;  Laterality: N/A;   COLONOSCOPY  2008   COLONOSCOPY WITH PROPOFOL N/A 07/21/2015   Procedure: COLONOSCOPY WITH PROPOFOL;  Surgeon: Midge Minium, MD;  Location: [facility];  Service: Endoscopy;  Laterality: N/A;   COLONOSCOPY WITH PROPOFOL N/A 01/05/2018   Procedure: COLONOSCOPY WITH PROPOFOL;  Surgeon: Wyline Mood, MD;  Location[facility];  Service: Gastroenterology;  Laterality: N/A;   ESOPHAGOGASTRODUODENOSCOPY (EGD) WITH PROPOFOL N/A 06/16/2016   Procedure: ESOPHAGOGASTRODUODENOSCOPY (EGD) WITH PROPOFOL;  Surgeon: Kieth Brightly,  MD;  Location: [facility];  Service: Endoscopy;  Laterality: N/A;   ESOPHAGOGASTRODUODENOSCOPY (EGD) WITH PROPOFOL N/A 01/05/2018   Procedure: ESOPHAGOGASTRODUODENOSCOPY (EGD) WITH  PROPOFOL;  Surgeon: Wyline Mood, MD;  Location[facility] ENDOSCOPY;  Service: Gastroenterology;  Laterality: N/A;   ESOPHAGOGASTRODUODENOSCOPY (EGD) WITH PROPOFOL N/A 06/13/2018   Procedure: ESOPHAGOGASTRODUODENOSCOPY (EGD) WITH PROPOFOL;  Surgeon: Wyline Mood, MD;  Location: [facility] ENDOSCOPY;  Service: Gastroenterology;  Laterality: N/A;   ESOPHAGOGASTRODUODENOSCOPY (EGD) WITH PROPOFOL N/A 07/16/2018   Procedure: ESOPHAGOGASTRODUODENOSCOPY (EGD) WITH PROPOFOL;  Surgeon: Midge Minium, MD;  Location: [facility];  Service: Endoscopy;  Laterality: N/A;   HARDWARE REMOVAL Left 12/27/2016   Procedure: HARDWARE REMOVAL LEFT  PROXIMAL HUMEROUS;  Surgeon: Signa Kell, MD;  Location: [facility];  Service: Orthopedics;  Laterality: Left;   IR GJ TUBE CHANGE  04/21/2018   NISSEN FUNDOPLICATION N/A 03/09/2018   Procedure: NISSEN FUNDOPLICATION;  Surgeon: Leafy Ro, MD;  Location: [facility];  Service: General;  Laterality: N/A;   ORIF HUMERUS FRACTURE Left 11/23/2016   Procedure: OPEN REDUCTION INTERNAL FIXATION (ORIF) PROXIMAL HUMERUS FRACTURE;  Surgeon: Signa Kell, MD;  Location: [facility];  Service: Orthopedics;  Laterality: Left;   PART MASTECTOMY,RADIO FREQUENCY LOCALIZER,AXILLARY SENTINEL NODE BIOPSY Right 12/25/2021   Procedure: PART MASTECTOMY,RADIO FREQUENCY LOCALIZER,AXILLARY SENTINEL NODE BIOPSY;  Surgeon: [name], DO;  Location: [facility];  Service: General;  Laterality: Right;   REPAIR OF ESOPHAGUS  03/09/2018   Procedure: REPAIR OF ESOPHAGUS;  Surgeon: Leafy Ro, MD;  Location: [facility];  Service: General;;   REVERSE SHOULDER ARTHROPLASTY Left 12/27/2016   Procedure: REVERSE SHOULDER ARTHROPLASTY;  Surgeon: Signa Kell, MD;  Location: [facility];  Service: Orthopedics;  Laterality: Left;   ROBOTIC ASSISTED LAPAROSCOPIC REPAIR OF PARAESOPHAGEAL HERNIA N/A 03/09/2018   Procedure: ROBOTIC HIATAL HERNIA CONVERTED TO OPEN;  Surgeon: Leafy Ro, MD;  Location: [facility];  Service: General;   Laterality: N/A;    FAMILY HISTORY: Family History  Problem Relation Age of Onset   Kidney disease Mother    Cancer Father        stomach   Heart attack Father    Hypertension Father    Alcohol abuse Father    Anxiety disorder Brother    Obesity Brother    COPD Brother    Depression Brother    Anxiety disorder Brother    Cancer Maternal Grandmother    Breast cancer Maternal Grandmother     ADVANCED DIRECTIVES (Y/N):  N  HEALTH MAINTENANCE: Social History   Tobacco Use   Smoking status: Every Day    Current packs/day: 0.10    Average packs/day: 0.1 packs/day for 12.9 years (1.3 ttl pk-yrs)    Types: Cigarettes    Start date: 11/25/2009   Smokeless tobacco: Never   Tobacco comments:    1-2 cigarette daily  Vaping Use   Vaping status: Never Used  Substance Use Topics   Alcohol use: No    Alcohol/week: 0.0 standard drinks of alcohol   Drug use: No     Colonoscopy:  PAP:  Bone density:  Lipid panel:  No Known Allergies  Current Outpatient Medications  Medication Sig Dispense Refill   atorvastatin (LIPITOR) 20 MG tablet Take 1 tablet (20 mg total) by mouth daily. 90 tablet 1   buPROPion (WELLBUTRIN XL) 300 MG 24 hr tablet One tab Once a day 90 tablet 1   busPIRone (BUSPAR) 10 MG tablet Take 1 tablet (10 mg total) by mouth 2 (two) times daily. 180 tablet 1  letrozole (FEMARA) 2.5 MG tablet Take 1 tablet (2.5 mg total) by mouth daily. 90 tablet 3   nystatin (MYCOSTATIN/NYSTOP) powder Apply 1 Application topically 3 (three) times daily. 15 g 0   nystatin ointment (MYCOSTATIN) Apply 1 Application topically 2 (two) times daily. 30 g 0   trazodone (DESYREL) 300 MG tablet TAKE 1 TABLET(300 MG) BY MOUTH AT BEDTIME 90 tablet 1   pantoprazole (PROTONIX) 40 MG tablet Take 1 tablet (40 mg total) by mouth daily. (Patient not taking: Reported on 10/28/2022) 90 tablet 3   No current facility-administered medications for this visit.   Facility-Administered Medications Ordered  in Other Visits  Medication Dose Route Frequency Provider Last Rate Last Admin   lidocaine (PF) (XYLOCAINE) 1 % injection 10 mL  10 mL Other Once [place], Isami, DO        OBJECTIVE: Vitals:   10/28/22 1033  BP: (!) 129/96  Pulse: 84  Resp: 16  Temp: (!) 97.4 F (36.3 C)  SpO2: 96%     Body mass index is 36.28 kg/m.    ECOG FS:0 - Asymptomatic  General: Well-developed, well-nourished, no acute distress. Eyes: Pink conjunctiva, anicteric sclera. HEENT: Normocephalic, moist mucous membranes. Lungs: No audible wheezing or coughing. Heart: Regular rate and rhythm. Abdomen: Soft, nontender, no obvious distention. Musculoskeletal: No edema, cyanosis, or clubbing. Neuro: Alert, answering all questions appropriately. Cranial nerves grossly intact. Skin: No rashes or petechiae noted. Psych: Normal affect.  LAB RESULTS:  Lab Results  Component Value Date   NA 139 07/27/2022   K 4.6 07/27/2022   CL 104 07/27/2022   CO2 21 07/27/2022   GLUCOSE 96 07/27/2022   BUN 16 07/27/2022   CREATININE 0.88 07/27/2022   CALCIUM 9.2 07/27/2022   PROT 6.4 07/27/2022   ALBUMIN 4.0 07/27/2022   AST 14 07/27/2022   ALT 14 07/27/2022   ALKPHOS 124 (H) 07/27/2022   BILITOT 0.3 07/27/2022   GFRNONAA >60 12/23/2021   GFRAA 93 01/21/2020    Lab Results  Component Value Date   WBC 5.1 07/27/2022   NEUTROABS 3.7 07/27/2022   HGB 13.4 07/27/2022   HCT 41.0 07/27/2022   MCV 92 07/27/2022   PLT 217 07/27/2022     STUDIES: MM DIAG BREAST TOMO BILATERAL  Result Date: 10/26/2022 CLINICAL DATA:  Patient with history of right breast lumpectomy 12/25/2021. EXAM: DIGITAL DIAGNOSTIC BILATERAL MAMMOGRAM WITH TOMOSYNTHESIS AND CAD TECHNIQUE: Bilateral digital diagnostic mammography and breast tomosynthesis was performed. The images were evaluated with computer-aided detection. COMPARISON:  Previous exam(s). ACR Breast Density Category c: The breasts are heterogeneously dense, which may obscure small  masses. FINDINGS: Stable postlumpectomy changes right breast. No new masses, calcifications or nonsurgical distortion identified within either breast. IMPRESSION: No mammographic evidence for malignancy. RECOMMENDATION: Bilateral diagnostic mammography in 1 year. I have discussed the findings and recommendations with the patient. If applicable, a reminder letter will be sent to the patient regarding the next appointment. BI-RADS CATEGORY  2: Benign. Electronically Signed   By: Annia Belt M.D.   On: 10/26/2022 09:55    ASSESSMENT: Pathologic stage Ia ER/PR positive, HER2 negative invasive carcinoma of the right breast.  Oncotype Dx 8  PLAN:    Pathologic stage Ia ER/PR positive, HER2 negative invasive carcinoma of the right breast: Patient has a distant history of right breast cancer in 1997.  She reports she underwent lumpectomy and XRT at that time.  She did not have chemotherapy or hormonal therapy.  Patient underwent lumpectomy on December 25, 2021  confirming stage of disease.  Oncotype Dx score was 8 which is considered low risk therefore no adjuvant chemotherapy was necessary.  Patient completed 10 fractions of partial breast irradiation in January 2024.  Her most recent mammogram on October 26, 2022 was reported as BI-RADS 2.  Repeat in October 2025.  Continue letrozole for a total of 5 years completing in January 2029.  Return to clinic in 6 months for routine evaluation.   Osteopenia: Bone mineral density on March 16, 2022 reported T-score of -2.0.  Continue calcium and vitamin D supplementation.  Repeat bone mineral density in February 2025.  I spent a total of 20 minutes reviewing chart data, face-to-face evaluation with the patient, counseling and coordination of care as detailed above.    Patient expressed understanding and was in agreement with this plan. She also understands that She can call clinic at any time with any questions, concerns, or complaints.    Cancer Staging  Invasive  ductal carcinoma of right breast in female [facility]) Staging form: Breast, AJCC 8th Edition - Clinical stage from 12/02/2021: Stage IA (cT1c, cN0, cM0, G2, ER+, PR+, HER2-) - Signed by Jeralyn Ruths, MD on 12/02/2021 Stage prefix: Initial diagnosis Histologic grading system: 3 grade system   Jeralyn Ruths, MD   10/28/2022 10:48 AM

## 2022-11-04 ENCOUNTER — Ambulatory Visit: Payer: Medicare Other | Attending: Radiation Oncology | Admitting: Radiation Oncology

## 2023-01-06 ENCOUNTER — Ambulatory Visit: Payer: Medicare Other | Admitting: Emergency Medicine

## 2023-01-06 VITALS — Ht 66.0 in | Wt 218.0 lb

## 2023-01-06 DIAGNOSIS — Z0000 Encounter for general adult medical examination without abnormal findings: Secondary | ICD-10-CM

## 2023-01-06 DIAGNOSIS — Z1211 Encounter for screening for malignant neoplasm of colon: Secondary | ICD-10-CM

## 2023-01-06 NOTE — Progress Notes (Signed)
Subjective:   Chelsey [name] is a 72 y.o. female who presents for Medicare Annual (Subsequent) preventive examination.  Visit Complete: Virtual I connected with  [name] on 01/06/23 by a audio enabled telemedicine application and verified that I am speaking with the correct person using two identifiers.  Patient Location: Home  Provider Location: Home Office  I discussed the limitations of evaluation and management by telemedicine. The patient expressed understanding and agreed to proceed.  Vital Signs: Because this visit was a virtual/telehealth visit, some criteria may be missing or patient reported. Any vitals not documented were not able to be obtained and vitals that have been documented are patient reported.    Cardiac Risk Factors include: advanced age (>63men, >79 women);dyslipidemia;obesity (BMI >30kg/m2);smoking/ tobacco exposure     Objective:    Today's Vitals   01/06/23 1108  Weight: 218 lb (98.9 kg)  Height: 5\' 6"  (1.676 m)   Body mass index is 35.19 kg/m.     01/06/2023   11:19 AM 10/28/2022   10:40 AM 04/13/2022   10:16 AM 03/22/2022    9:41 AM 01/12/2022    9:15 AM 12/25/2021   12:24 PM 12/23/2021   11:48 AM  Advanced Directives  Does Patient Have a Medical Advance Directive? Yes No No No No No No  Type of Estate agent of [place];Living will        Does patient want to make changes to medical advance directive? No - Patient declined        Copy of Healthcare Power of Attorney in Chart? Yes - validated most recent copy scanned in chart (See row information)        Would patient like information on creating a medical advance directive?  No - Patient declined No - Patient declined No - Patient declined No - Patient declined No - Patient declined     Current Medications (verified) Outpatient Encounter Medications as of 01/06/2023  Medication Sig   atorvastatin (LIPITOR) 20 MG tablet Take 1 tablet (20 mg total) by mouth  daily.   buPROPion (WELLBUTRIN XL) 300 MG 24 hr tablet One tab Once a day   busPIRone (BUSPAR) 10 MG tablet Take 1 tablet (10 mg total) by mouth 2 (two) times daily.   letrozole (FEMARA) 2.5 MG tablet Take 1 tablet (2.5 mg total) by mouth daily.   pantoprazole (PROTONIX) 40 MG tablet Take 1 tablet (40 mg total) by mouth daily.   trazodone (DESYREL) 300 MG tablet TAKE 1 TABLET(300 MG) BY MOUTH AT BEDTIME   nystatin (MYCOSTATIN/NYSTOP) powder Apply 1 Application topically 3 (three) times daily. (Patient not taking: Reported on 01/06/2023)   nystatin ointment (MYCOSTATIN) Apply 1 Application topically 2 (two) times daily. (Patient not taking: Reported on 01/06/2023)   Facility-Administered Encounter Medications as of 01/06/2023  Medication   lidocaine (PF) (XYLOCAINE) 1 % injection 10 mL    Allergies (verified) Patient has no known allergies.   History: Past Medical History:  Diagnosis Date   Aortic atherosclerosis (HCC)    Breast cancer (HCC) 1997   right breast, radiation   Bronchitis    recent/ 06/09/15 had chest xray/ [name]/resolved   Cancer [facility]) 1997   right lumpectomy,L/Ad/R    GERD (gastroesophageal reflux disease)    History of hiatal hernia    Hypercholesteremia    Hyperlipidemia    Low BP    TYPICALLY RUNS 80'S/60'S   Osteopenia    Panic attack    Paraesophageal hernia  Personal history of malignant neoplasm of breast    Personal history of radiation therapy    Past Surgical History:  Procedure Laterality Date   BICEPT TENODESIS Left 11/23/2016   Procedure: BICEPS TENODESIS;  Surgeon: Signa Kell, MD;  Location: [facility] OR[facility];  Service: Orthopedics;  Laterality: Left;   BREAST BIOPSY Right 12/21/2021   MM RT RADIO FREQUENCY TAG LOC MAMMO GUIDE 12/21/2021 [facility]   BREAST EXCISIONAL BIOPSY Right 1997   pos   BREAST LUMPECTOMY Right 12/25/2021   breast cancer   BREAST SURGERY Right 1997   lumpectomy   CHOLECYSTECTOMY N/A 08/06/2016    Procedure: LAPAROSCOPIC CHOLECYSTECTOMY WITH INTRAOPERATIVE CHOLANGIOGRAM;  Surgeon: Kieth Brightly, MD;  Location: [facility];  Service: General;  Laterality: N/A;   COLONOSCOPY  2008   COLONOSCOPY WITH PROPOFOL N/A 07/21/2015   Procedure: COLONOSCOPY WITH PROPOFOL;  Surgeon: Midge Minium, MD;  Location: [facility];  Service: Endoscopy;  Laterality: N/A;   COLONOSCOPY WITH PROPOFOL N/A 01/05/2018   Procedure: COLONOSCOPY WITH PROPOFOL;  Surgeon: Wyline Mood, MD;  Location: [facility] ENDOSCOPY;  Service: Gastroenterology;  Laterality: N/A;   ESOPHAGOGASTRODUODENOSCOPY (EGD) WITH PROPOFOL N/A 06/16/2016   Procedure: ESOPHAGOGASTRODUODENOSCOPY (EGD) WITH PROPOFOL;  Surgeon: Kieth Brightly, MD;  Location: [facility] ENDOSCOPY;  Service: Endoscopy;  Laterality: N/A;   ESOPHAGOGASTRODUODENOSCOPY (EGD) WITH PROPOFOL N/A 01/05/2018   Procedure: ESOPHAGOGASTRODUODENOSCOPY (EGD) WITH PROPOFOL;  Surgeon: Wyline Mood, MD;  Location: [facility] ENDOSCOPY;  Service: Gastroenterology;  Laterality: N/A;   ESOPHAGOGASTRODUODENOSCOPY (EGD) WITH PROPOFOL N/A 06/13/2018   Procedure: ESOPHAGOGASTRODUODENOSCOPY (EGD) WITH PROPOFOL;  Surgeon: Wyline Mood, MD;  Location: [facility] ENDOSCOPY;  Service: Gastroenterology;  Laterality: N/A;   ESOPHAGOGASTRODUODENOSCOPY (EGD) WITH PROPOFOL N/A 07/16/2018   Procedure: ESOPHAGOGASTRODUODENOSCOPY (EGD) WITH PROPOFOL;  Surgeon: Midge Minium, MD;  Location: [facility] ENDOSCOPY;  Service: Endoscopy;  Laterality: N/A;   HARDWARE REMOVAL Left 12/27/2016   Procedure: HARDWARE REMOVAL LEFT  PROXIMAL HUMEROUS;  Surgeon: Signa Kell, MD;  Location: [facility];  Service: Orthopedics;  Laterality: Left;   IR GJ TUBE CHANGE  04/21/2018   NISSEN FUNDOPLICATION N/A 03/09/2018   Procedure: NISSEN FUNDOPLICATION;  Surgeon: Leafy Ro, MD;  Location: [facility];  Service: General;  Laterality: N/A;   ORIF HUMERUS FRACTURE Left 11/23/2016   Procedure: OPEN REDUCTION INTERNAL FIXATION (ORIF)  PROXIMAL HUMERUS FRACTURE;  Surgeon: Signa Kell, MD;  Location: [facility];  Service: Orthopedics;  Laterality: Left;   PART MASTECTOMY,RADIO FREQUENCY LOCALIZER,AXILLARY SENTINEL NODE BIOPSY Right 12/25/2021   Procedure: PART MASTECTOMY,RADIO FREQUENCY LOCALIZER,AXILLARY SENTINEL NODE BIOPSY;  Surgeon: [name], DO;  Location: [facility];  Service: General;  Laterality: Right;   REPAIR OF ESOPHAGUS  03/09/2018   Procedure: REPAIR OF ESOPHAGUS;  Surgeon: Leafy Ro, MD;  Location: [facility];  Service: General;;   REVERSE SHOULDER ARTHROPLASTY Left 12/27/2016   Procedure: REVERSE SHOULDER ARTHROPLASTY;  Surgeon: Signa Kell, MD;  Location: [facility];  Service: Orthopedics;  Laterality: Left;   ROBOTIC ASSISTED LAPAROSCOPIC REPAIR OF PARAESOPHAGEAL HERNIA N/A 03/09/2018   Procedure: ROBOTIC HIATAL HERNIA CONVERTED TO OPEN;  Surgeon: Leafy Ro, MD;  Location: [facility];  Service: General;  Laterality: N/A;   Family History  Problem Relation Age of Onset   Kidney disease Mother    Cancer Father        stomach   Heart attack Father    Hypertension Father    Alcohol abuse Father    Anxiety disorder Brother    Obesity Brother    COPD Brother    Depression  Brother    Anxiety disorder Brother    Cancer Maternal Grandmother    Breast cancer Maternal Grandmother    Social History   Socioeconomic History   Marital status: Widowed    Spouse name: Not on file   Number of children: 2   Years of education: Not on file   Highest education level: Bachelor's degree (e.g., BA, AB, BS)  Occupational History   Occupation: retired  Tobacco Use   Smoking status: Every Day    Current packs/day: 0.15    Average packs/day: 0.2 packs/day for 13.1 years (2.0 ttl pk-yrs)    Types: Cigarettes    Start date: 11/25/2009   Smokeless tobacco: Never   Tobacco comments:    3 cigarette daily  Vaping Use   Vaping status: Never Used  Substance and Sexual Activity   Alcohol use: No    Alcohol/week:  0.0 standard drinks of alcohol   Drug use: No   Sexual activity: Never  Other Topics Concern   Not on file  Social History Narrative   Lives with son   Social Drivers of Health   Financial Resource Strain: Low Risk  (01/06/2023)   Overall Financial Resource Strain (CARDIA)    Difficulty of Paying Living Expenses: Not very hard  Food Insecurity: No Food Insecurity (01/06/2023)   Hunger Vital Sign    Worried About Running Out of Food in the Last Year: Never true    Ran Out of Food in the Last Year: Never true  Transportation Needs: No Transportation Needs (01/06/2023)   PRAPARE - Administrator, Civil Service (Medical): No    Lack of Transportation (Non-Medical): No  Physical Activity: Inactive (01/06/2023)   Exercise Vital Sign    Days of Exercise per Week: 0 days    Minutes of Exercise per Session: 0 min  Stress: No Stress Concern Present (01/06/2023)   [employer] of Occupational Health - Occupational Stress Questionnaire    Feeling of Stress : Not at all  Social Connections: Socially Isolated (01/06/2023)   Social Connection and Isolation Panel [NHANES]    Frequency of Communication with Friends and Family: Three times a week    Frequency of Social Gatherings with Friends and Family: Once a week    Attends Religious Services: Never    Database administrator or Organizations: No    Attends Banker Meetings: Never    Marital Status: Widowed    Tobacco Counseling Ready to quit: Not Answered Counseling given: Not Answered Tobacco comments: 3 cigarette daily   Clinical Intake:  Pre-visit preparation completed: Yes  Pain : No/denies pain     BMI - recorded: 35.19 Nutritional Status: BMI > 30  Obese Nutritional Risks: None Diabetes: No  How often do you need to have someone help you when you read instructions, pamphlets, or other written materials from your doctor or pharmacy?: 1 - Never  Interpreter Needed?: No  Information  entered by :: Tora Kindred, CMA   Activities of Daily Living    01/06/2023   11:10 AM  In your present state of health, do you have any difficulty performing the following activities:  Hearing? 0  Vision? 0  Difficulty concentrating or making decisions? 0  Walking or climbing stairs? 0  Dressing or bathing? 0  Doing errands, shopping? 0  Preparing Food and eating ? N  Using the Toilet? N  In the past six months, have you accidently leaked urine? N  Do you have problems  with loss of bowel control? N  Managing your Medications? N  Managing your Finances? N  Housekeeping or managing your Housekeeping? N    Patient Care Team: Dorcas Carrow, DO as PCP - General (Family Medicine) Kieth Brightly, MD (General Surgery) Hulen Luster, RN as Oncology Nurse Navigator Carmina Miller, MD as Consulting Physician (Radiation Oncology) Jeralyn Ruths, MD as Consulting Physician (Oncology) [name], DO as Consulting Physician (General Surgery)  Indicate any recent Medical Services you may have received from other than [facility] providers in the past year (date may be approximate).     Assessment:   This is a routine wellness examination for Luddie.  Hearing/Vision screen Hearing Screening - Comments:: Denies hearing loss Vision Screening - Comments:: Doesn't get eye exams   Goals Addressed               This Visit's Progress     Weight (lb) < 170 lb (77.1 kg) (pt-stated)   218 lb (98.9 kg)     Depression Screen    01/06/2023   11:17 AM 07/27/2022   10:03 AM 01/26/2022   12:03 PM 11/03/2021   11:39 AM 10/09/2021   10:19 AM 09/25/2021   11:07 AM 07/23/2021   11:22 AM  PHQ 2/9 Scores  PHQ - 2 Score 0 0 0 0 0 0 0  PHQ- 9 Score  1 1 0 1 0 0    Fall Risk    01/06/2023   11:19 AM 07/27/2022   10:02 AM 11/03/2021   11:37 AM 10/09/2021   10:20 AM 09/25/2021   11:07 AM  Fall Risk   Falls in the past year? 0 0 0 0 1  Number falls in past yr: 0 0 0 0 1  Injury with  Fall? 0 0 0 0 0  Risk for fall due to : No Fall Risks No Fall Risks  No Fall Risks History of fall(s)  Follow up Falls prevention discussed Falls evaluation completed Falls evaluation completed;Education provided;Falls prevention discussed Falls evaluation completed Falls evaluation completed    MEDICARE RISK AT HOME: Medicare Risk at Home Any stairs in or around the home?: Yes (2 steps) If so, are there any without handrails?: Yes Home free of loose throw rugs in walkways, pet beds, electrical cords, etc?: Yes Adequate lighting in your home to reduce risk of falls?: Yes Life alert?: No Use of a cane, walker or w/c?: No Grab bars in the bathroom?: No Shower chair or bench in shower?: No Elevated toilet seat or a handicapped toilet?: No  TIMED UP AND GO:  Was the test performed?  No    Cognitive Function:        01/06/2023   11:21 AM 11/03/2021   11:36 AM  6CIT Screen  What Year? 0 points 0 points  What month? 0 points 0 points  What time? 0 points 0 points  Count back from 20 0 points 0 points  Months in reverse 0 points 0 points  Repeat phrase 0 points 0 points  Total Score 0 points 0 points    Immunizations Immunization History  Administered Date(s) Administered   Fluad Quad(high Dose 65+) 01/21/2020, 01/22/2021, 01/26/2022   Influenza, High Dose Seasonal PF 10/28/2017   Influenza,inj,Quad PF,6+ Mos 01/05/2016   Influenza-Unspecified 11/20/2014, 10/18/2016   PFIZER(Purple Top)SARS-COV-2 Vaccination 09/27/2019, 10/27/2019   Pneumococcal Conjugate-13 07/05/2016   Pneumococcal Polysaccharide-23 03/10/2018   Td 02/14/2007   Tdap 07/27/2017   Zoster, Live 05/29/2013    TDAP  status: Up to date  Flu Vaccine status: Due, Education has been provided regarding the importance of this vaccine. Advised may receive this vaccine at local pharmacy or Health Dept. Aware to provide a copy of the vaccination record if obtained from local pharmacy or Health Dept. Verbalized  acceptance and understanding.  Pneumococcal vaccine status: Up to date  Covid-19 vaccine status: Information provided on how to obtain vaccines.   Qualifies for Shingles Vaccine? Yes   Zostavax completed Yes   Shingrix Completed?: No.    Education has been provided regarding the importance of this vaccine. Patient has been advised to call insurance company to determine out of pocket expense if they have not yet received this vaccine. Advised may also receive vaccine at local pharmacy or Health Dept. Verbalized acceptance and understanding.  Screening Tests Health Maintenance  Topic Date Due   Zoster Vaccines- Shingrix (1 of 2) 07[birth year]05/1970   COVID-19 Vaccine (3 - Pfizer risk series) 11/24/2019   INFLUENZA VACCINE  08/26/2022   Colonoscopy  01/06/2023   MAMMOGRAM  10/26/2023   Medicare Annual Wellness (AWV)  01/06/2024   DTaP/Tdap/Td (3 - Td or Tdap) 07/28/2027   Pneumonia Vaccine 51+ Years old  Completed   DEXA SCAN  Completed   Hepatitis C Screening  Completed   HPV VACCINES  Aged Out    Health Maintenance  Health Maintenance Due  Topic Date Due   Zoster Vaccines- Shingrix (1 of 2) 07/05/1970   COVID-19 Vaccine (3 - Pfizer risk series) 11/24/2019   INFLUENZA VACCINE  08/26/2022   Colonoscopy  01/06/2023    Colorectal cancer screening: Referral to GI placed 01/06/23. Pt aware the office will call re: appt.  Mammogram status: Completed 10/26/22. Repeat every year  Bone Density status: Completed 03/16/22. Results reflect: Bone density results: OSTEOPENIA. Repeat every 1 years. Scheduled 03/16/22 per Dr. Orlie Dakin  Lung Cancer Screening: (Low Dose CT Chest recommended if Age 37-80 years, 20 pack-year currently smoking OR have quit w/in 15years.) does not qualify.   Lung Cancer Screening Referral: n/a  Additional Screening:  Hepatitis C Screening: does not qualify; Completed 07/04/15  Vision Screening: Recommended annual ophthalmology exams for early detection of glaucoma and  other disorders of the eye.  Dental Screening: Recommended annual dental exams for proper oral hygiene   Community Resource Referral / Chronic Care Management: CRR required this visit?  No   CCM required this visit?  No     Plan:     I have personally reviewed and noted the following in the patient's chart:   Medical and social history Use of alcohol, tobacco or illicit drugs  Current medications and supplements including opioid prescriptions. Patient is not currently taking opioid prescriptions. Functional ability and status Nutritional status Physical activity Advanced directives List of other physicians Hospitalizations, surgeries, and ER visits in previous 12 months Vitals Screenings to include cognitive, depression, and falls Referrals and appointments  In addition, I have reviewed and discussed with patient certain preventive protocols, quality metrics, and best practice recommendations. A written personalized care plan for preventive services as well as general preventive health recommendations were provided to patient.     Tora Kindred, CMA   01/06/2023   After Visit Summary: (Mail) Due to this being a telephonic visit, the after visit summary with patients personalized plan was offered to patient via mail   Nurse Notes:  Needs flu, covid and shingles vaccines. Placed referral to GI for colonoscopy that is due. Patient scheduled for a DEXA scan  03/21/23 by Dr. Orlie Dakin. Patient has not been taking calcium and vitamin D per his 10/28/22 office note.

## 2023-01-06 NOTE — Patient Instructions (Addendum)
Chelsey [name] , Thank you for taking time to come for your Medicare Wellness Visit. I appreciate your ongoing commitment to your health goals. Please review the following plan we discussed and let me know if I can assist you in the future.   Referrals/Orders/Follow-Ups/Clinician Recommendations: Get the flu, covid and shingles vaccines at your convenience. I have placed a referral to [facility] for a colonoscopy that is due. You should receive a call from their office to schedule.  This is a list of the screening recommended for you and due dates:  Health Maintenance  Topic Date Due   Zoster (Shingles) Vaccine (1 of 2) 07/05/1970   COVID-19 Vaccine (3 - Pfizer risk series) 11/24/2019   Colon Cancer Screening  01/06/2023   Flu Shot  04/25/2023*   Mammogram  10/26/2023   Medicare Annual Wellness Visit  01/06/2024   DTaP/Tdap/Td vaccine (3 - Td or Tdap) 07/28/2027   Pneumonia Vaccine  Completed   DEXA scan (bone density measurement)  Completed   Hepatitis C Screening  Completed   HPV Vaccine  Aged Out  *Topic was postponed. The date shown is not the original due date.    Advanced directives: (In Chart) A copy of your advanced directives are scanned into your chart should your provider ever need it.  Next Medicare Annual Wellness Visit scheduled for next year: Yes, 01/12/24 @ 12:30pm

## 2023-01-13 ENCOUNTER — Encounter: Payer: Self-pay | Admitting: *Deleted

## 2023-01-28 ENCOUNTER — Encounter: Payer: Self-pay | Admitting: Family Medicine

## 2023-01-28 ENCOUNTER — Ambulatory Visit (INDEPENDENT_AMBULATORY_CARE_PROVIDER_SITE_OTHER): Payer: Medicare Other | Admitting: Family Medicine

## 2023-01-28 VITALS — BP 90/62 | HR 96 | Ht 65.5 in | Wt 214.6 lb

## 2023-01-28 DIAGNOSIS — M85851 Other specified disorders of bone density and structure, right thigh: Secondary | ICD-10-CM

## 2023-01-28 DIAGNOSIS — E785 Hyperlipidemia, unspecified: Secondary | ICD-10-CM

## 2023-01-28 DIAGNOSIS — D692 Other nonthrombocytopenic purpura: Secondary | ICD-10-CM | POA: Diagnosis not present

## 2023-01-28 DIAGNOSIS — Z23 Encounter for immunization: Secondary | ICD-10-CM | POA: Diagnosis not present

## 2023-01-28 DIAGNOSIS — R0609 Other forms of dyspnea: Secondary | ICD-10-CM | POA: Diagnosis not present

## 2023-01-28 DIAGNOSIS — F332 Major depressive disorder, recurrent severe without psychotic features: Secondary | ICD-10-CM | POA: Diagnosis not present

## 2023-01-28 DIAGNOSIS — D508 Other iron deficiency anemias: Secondary | ICD-10-CM | POA: Diagnosis not present

## 2023-01-28 DIAGNOSIS — C50911 Malignant neoplasm of unspecified site of right female breast: Secondary | ICD-10-CM

## 2023-01-28 DIAGNOSIS — I700 Atherosclerosis of aorta: Secondary | ICD-10-CM

## 2023-01-28 DIAGNOSIS — Z0000 Encounter for general adult medical examination without abnormal findings: Secondary | ICD-10-CM

## 2023-01-28 DIAGNOSIS — Z1211 Encounter for screening for malignant neoplasm of colon: Secondary | ICD-10-CM | POA: Diagnosis not present

## 2023-01-28 MED ORDER — SPACER/AERO-HOLDING CHAMBERS DEVI: 1.0000 | 3 refills | Status: DC | PRN

## 2023-01-28 MED ORDER — BUPROPION HCL ER (XL) 300 MG PO TB24: ORAL_TABLET | ORAL | 1 refills | Status: DC

## 2023-01-28 MED ORDER — BREZTRI AEROSPHERE 160-9-4.8 MCG/ACT IN AERO: 2.0000 | INHALATION_SPRAY | Freq: Two times a day (BID) | RESPIRATORY_TRACT | Status: DC

## 2023-01-28 MED ORDER — BREZTRI AEROSPHERE 160-9-4.8 MCG/ACT IN AERO: 2.0000 | INHALATION_SPRAY | Freq: Two times a day (BID) | RESPIRATORY_TRACT | 11 refills | Status: DC

## 2023-01-28 MED ORDER — ATORVASTATIN CALCIUM 20 MG PO TABS: 20.0000 mg | ORAL_TABLET | Freq: Every day | ORAL | 1 refills | Status: DC

## 2023-01-28 MED ORDER — ALBUTEROL SULFATE (2.5 MG/3ML) 0.083% IN NEBU
2.5000 mg | INHALATION_SOLUTION | Freq: Once | RESPIRATORY_TRACT | Status: AC
  Administered 2023-01-28: 2.5 mg via RESPIRATORY_TRACT

## 2023-01-28 MED ORDER — TRAZODONE HCL 300 MG PO TABS: ORAL_TABLET | ORAL | 1 refills | Status: DC

## 2023-01-28 MED ORDER — BUSPIRONE HCL 10 MG PO TABS: 10.0000 mg | ORAL_TABLET | Freq: Two times a day (BID) | ORAL | 1 refills | Status: DC

## 2023-01-28 NOTE — Progress Notes (Signed)
 BP 90/62   Pulse 96   Ht 5' 5.5 (1.664 m)   Wt 214 lb 9.6 oz (97.3 kg)   LMP  (LMP Unknown)   SpO2 95%   BMI 35.17 kg/m    Subjective:    Patient ID: Chelsey [name], female    DOB: 02-28-1950, 73 y.o.   MRN: [id number]  HPI: Chelsey [name] is a 73 y.o. female presenting on 01/28/2023 for comprehensive medical examination. Current medical complaints include:  DEPRESSION Mood status: controlled Satisfied with current treatment?: yes Symptom severity: mild  Duration of current treatment : chronic Side effects: no Medication compliance: excellent compliance Psychotherapy/counseling: no  Previous psychiatric medications: wellbutrin  Depressed mood: no Anxious mood: no Anhedonia: no Significant weight loss or gain: no Insomnia: no  Fatigue: yes Feelings of worthlessness or guilt: no Impaired concentration/indecisiveness: no Suicidal ideations: no Hopelessness: no Crying spells: no    01/28/2023    9:33 AM 01/06/2023   11:17 AM 07/27/2022   10:03 AM 01/26/2022   12:03 PM 11/03/2021   11:39 AM  Depression screen PHQ 2/9  Decreased Interest 0 0 0 0 0  Down, Depressed, Hopeless 0 0 0 0 0  PHQ - 2 Score 0 0 0 0 0  Altered sleeping 0  0 0 0  Tired, decreased energy 0  1 1 0  Change in appetite 3  0 0 0  Feeling bad or failure about yourself  0  0 0 0  Trouble concentrating 0  0 0 0  Moving slowly or fidgety/restless 0  0 0 0  Suicidal thoughts 0  0 0   PHQ-9 Score 3  1 1  0  Difficult doing work/chores Somewhat difficult  Not difficult at all Not difficult at all Not difficult at all    HYPERLIPIDEMIA Hyperlipidemia status: excellent compliance Satisfied with current treatment?  no Side effects:  no Medication compliance: excellent compliance Past cholesterol meds: atorvastatin  Supplements: none Aspirin :  no The 10-year ASCVD risk score (Arnett DK, et al., 2019) is: 8.5%   Values used to calculate the score:     Age: 62 years     Sex: Female     Is Non-Hispanic  African American: No     Diabetic: No     Tobacco smoker: Yes     Systolic Blood Pressure: 90 mmHg     Is BP treated: No     HDL Cholesterol: 46 mg/dL     Total Cholesterol: 170 mg/dL Chest pain:  no Coronary artery disease:  no  Menopausal Symptoms: no  Depression Screen done today and results listed below:     01/28/2023    9:33 AM 01/06/2023   11:17 AM 07/27/2022   10:03 AM 01/26/2022   12:03 PM 11/03/2021   11:39 AM  Depression screen PHQ 2/9  Decreased Interest 0 0 0 0 0  Down, Depressed, Hopeless 0 0 0 0 0  PHQ - 2 Score 0 0 0 0 0  Altered sleeping 0  0 0 0  Tired, decreased energy 0  1 1 0  Change in appetite 3  0 0 0  Feeling bad or failure about yourself  0  0 0 0  Trouble concentrating 0  0 0 0  Moving slowly or fidgety/restless 0  0 0 0  Suicidal thoughts 0  0 0   PHQ-9 Score 3  1 1  0  Difficult doing work/chores Somewhat difficult  Not difficult at all Not difficult at all Not difficult  at all   Past Medical History:  Past Medical History:  Diagnosis Date   Aortic atherosclerosis (HCC)    Breast cancer (HCC) 1997   right breast, radiation   Bronchitis    recent/ 06/09/15 had chest xray/ [facility]/resolved   Cancer [facility]) 1997   right lumpectomy,L/Ad/R    GERD (gastroesophageal reflux disease)    History of hiatal hernia    Hypercholesteremia    Hyperlipidemia    Low BP    TYPICALLY RUNS 80'S/60'S   Osteopenia    Panic attack    Paraesophageal hernia    Personal history of malignant neoplasm of breast    Personal history of radiation therapy     Surgical History:  Past Surgical History:  Procedure Laterality Date   BICEPT TENODESIS Left 11/23/2016   Procedure: BICEPS TENODESIS;  Surgeon: Tobie Priest, MD;  Location: [facility] OR[facility];  Service: Orthopedics;  Laterality: Left;   BREAST BIOPSY Right 12/21/2021   MM RT RADIO FREQUENCY TAG LOC MAMMO GUIDE 12/21/2021 [facility]   BREAST EXCISIONAL BIOPSY Right 1997   pos   BREAST LUMPECTOMY Right  12/25/2021   breast cancer   BREAST SURGERY Right 1997   lumpectomy   CHOLECYSTECTOMY N/A 08/06/2016   Procedure: LAPAROSCOPIC CHOLECYSTECTOMY WITH INTRAOPERATIVE CHOLANGIOGRAM;  Surgeon: Dellie Louanne MATSU, MD;  Location: [facility];  Service: General;  Laterality: N/A;   COLONOSCOPY  2008   COLONOSCOPY WITH PROPOFOL  N/A 07/21/2015   Procedure: COLONOSCOPY WITH PROPOFOL ;  Surgeon: Rogelia Copping, MD;  Location: [facility];  Service: Endoscopy;  Laterality: N/A;   COLONOSCOPY WITH PROPOFOL  N/A 01/05/2018   Procedure: COLONOSCOPY WITH PROPOFOL ;  Surgeon: Therisa Bi, MD;  Location: [facility] ENDOSCOPY;  Service: Gastroenterology;  Laterality: N/A;   ESOPHAGOGASTRODUODENOSCOPY (EGD) WITH PROPOFOL  N/A 06/16/2016   Procedure: ESOPHAGOGASTRODUODENOSCOPY (EGD) WITH PROPOFOL ;  Surgeon: Dellie Louanne MATSU, MD;  Location: [facility] ENDOSCOPY;  Service: Endoscopy;  Laterality: N/A;   ESOPHAGOGASTRODUODENOSCOPY (EGD) WITH PROPOFOL  N/A 01/05/2018   Procedure: ESOPHAGOGASTRODUODENOSCOPY (EGD) WITH PROPOFOL ;  Surgeon: Therisa Bi, MD;  Location: [facility] ENDOSCOPY;  Service: Gastroenterology;  Laterality: N/A;   ESOPHAGOGASTRODUODENOSCOPY (EGD) WITH PROPOFOL  N/A 06/13/2018   Procedure: ESOPHAGOGASTRODUODENOSCOPY (EGD) WITH PROPOFOL ;  Surgeon: Therisa Bi, MD;  Location: [facility] ENDOSCOPY;  Service: Gastroenterology;  Laterality: N/A;   ESOPHAGOGASTRODUODENOSCOPY (EGD) WITH PROPOFOL  N/A 07/16/2018   Procedure: ESOPHAGOGASTRODUODENOSCOPY (EGD) WITH PROPOFOL ;  Surgeon: Copping Rogelia, MD;  Location: [facility] ENDOSCOPY;  Service: Endoscopy;  Laterality: N/A;   HARDWARE REMOVAL Left 12/27/2016   Procedure: HARDWARE REMOVAL LEFT  PROXIMAL HUMEROUS;  Surgeon: Tobie Priest, MD;  Location: [facility];  Service: Orthopedics;  Laterality: Left;   IR GJ TUBE CHANGE  04/21/2018   NISSEN FUNDOPLICATION N/A 03/09/2018   Procedure: NISSEN FUNDOPLICATION;  Surgeon: Jordis Laneta FALCON, MD;  Location: [facility];  Service: General;  Laterality:  N/A;   ORIF HUMERUS FRACTURE Left 11/23/2016   Procedure: OPEN REDUCTION INTERNAL FIXATION (ORIF) PROXIMAL HUMERUS FRACTURE;  Surgeon: Tobie Priest, MD;  Location: [facility];  Service: Orthopedics;  Laterality: Left;   PART MASTECTOMY,RADIO FREQUENCY LOCALIZER,AXILLARY SENTINEL NODE BIOPSY Right 12/25/2021   Procedure: PART MASTECTOMY,RADIO FREQUENCY LOCALIZER,AXILLARY SENTINEL NODE BIOPSY;  Surgeon: Tye Millet, DO;  Location: [facility];  Service: General;  Laterality: Right;   REPAIR OF ESOPHAGUS  03/09/2018   Procedure: REPAIR OF ESOPHAGUS;  Surgeon: Jordis Laneta FALCON, MD;  Location: [facility];  Service: General;;   REVERSE SHOULDER ARTHROPLASTY Left 12/27/2016   Procedure: REVERSE SHOULDER ARTHROPLASTY;  Surgeon: Tobie Priest, MD;  Location: [facility]  ORS;  Service: Orthopedics;  Laterality: Left;   ROBOTIC ASSISTED LAPAROSCOPIC REPAIR OF PARAESOPHAGEAL HERNIA N/A 03/09/2018   Procedure: ROBOTIC HIATAL HERNIA CONVERTED TO OPEN;  Surgeon: Jordis Laneta FALCON, MD;  Location: [facility];  Service: General;  Laterality: N/A;    Medications:  Current Outpatient Medications on File Prior to Visit  Medication Sig   letrozole  (FEMARA ) 2.5 MG tablet Take 1 tablet (2.5 mg total) by mouth daily.   pantoprazole  (PROTONIX ) 40 MG tablet Take 1 tablet (40 mg total) by mouth daily.   Current Facility-Administered Medications on File Prior to Visit  Medication   lidocaine  (PF) (XYLOCAINE ) 1 % injection 10 mL    Allergies:  No Known Allergies  Social History:  Social History   Socioeconomic History   Marital status: Widowed    Spouse name: Not on file   Number of children: 2   Years of education: Not on file   Highest education level: Bachelor's degree (e.g., BA, AB, BS)  Occupational History   Occupation: retired  Tobacco Use   Smoking status: Every Day    Current packs/day: 0.15    Average packs/day: 0.2 packs/day for 13.2 years (2.0 ttl pk-yrs)    Types: Cigarettes    Start date: 11/25/2009    Smokeless tobacco: Never   Tobacco comments:    3 cigarette daily  Vaping Use   Vaping status: Never Used  Substance and Sexual Activity   Alcohol use: No    Alcohol/week: 0.0 standard drinks of alcohol   Drug use: No   Sexual activity: Never  Other Topics Concern   Not on file  Social History Narrative   Lives with son   Social Drivers of Health   Financial Resource Strain: Low Risk  (01/06/2023)   Overall Financial Resource Strain (CARDIA)    Difficulty of Paying Living Expenses: Not very hard  Food Insecurity: No Food Insecurity (01/06/2023)   Hunger Vital Sign    Worried About Running Out of Food in the Last Year: Never true    Ran Out of Food in the Last Year: Never true  Transportation Needs: No Transportation Needs (01/06/2023)   PRAPARE - Administrator, Civil Service (Medical): No    Lack of Transportation (Non-Medical): No  Physical Activity: Inactive (01/06/2023)   Exercise Vital Sign    Days of Exercise per Week: 0 days    Minutes of Exercise per Session: 0 min  Stress: No Stress Concern Present (01/06/2023)   [employer] of Occupational Health - Occupational Stress Questionnaire    Feeling of Stress : Not at all  Social Connections: Socially Isolated (01/06/2023)   Social Connection and Isolation Panel [NHANES]    Frequency of Communication with Friends and Family: Three times a week    Frequency of Social Gatherings with Friends and Family: Once a week    Attends Religious Services: Never    Database Administrator or Organizations: No    Attends Banker Meetings: Never    Marital Status: Widowed  Intimate Partner Violence: Not At Risk (01/06/2023)   Humiliation, Afraid, Rape, and Kick questionnaire    Fear of Current or Ex-Partner: No    Emotionally Abused: No    Physically Abused: No    Sexually Abused: No   Social History   Tobacco Use  Smoking Status Every Day   Current packs/day: 0.15   Average packs/day: 0.2  packs/day for 13.2 years (2.0 ttl pk-yrs)   Types: Cigarettes  Start date: 11/25/2009  Smokeless Tobacco Never  Tobacco Comments   3 cigarette daily   Social History   Substance and Sexual Activity  Alcohol Use No   Alcohol/week: 0.0 standard drinks of alcohol    Family History:  Family History  Problem Relation Age of Onset   Kidney disease Mother    Cancer Father        stomach   Heart attack Father    Hypertension Father    Alcohol abuse Father    Anxiety disorder Brother    Obesity Brother    COPD Brother    Depression Brother    Anxiety disorder Brother    Cancer Maternal Grandmother    Breast cancer Maternal Grandmother     Past medical history, surgical history, medications, allergies, family history and social history reviewed with patient today and changes made to appropriate areas of the chart.   Review of Systems  Constitutional: Negative.   HENT: Negative.    Eyes: Negative.   Respiratory:  Positive for cough and shortness of breath. Negative for hemoptysis, sputum production and wheezing.   Cardiovascular: Negative.   Gastrointestinal:  Positive for constipation and diarrhea. Negative for abdominal pain, blood in stool, heartburn, melena, nausea and vomiting.  Genitourinary:  Positive for frequency. Negative for dysuria, flank pain, hematuria and urgency.  Musculoskeletal: Negative.   Skin: Negative.   Neurological:  Positive for dizziness. Negative for tingling, tremors, sensory change, speech change, focal weakness, seizures, loss of consciousness, weakness and headaches.  Endo/Heme/Allergies:  Negative for environmental allergies and polydipsia. Bruises/bleeds easily.  Psychiatric/Behavioral: Negative.     All other ROS negative except what is listed above and in the HPI.      Objective:    BP 90/62   Pulse 96   Ht 5' 5.5 (1.664 m)   Wt 214 lb 9.6 oz (97.3 kg)   LMP  (LMP Unknown)   SpO2 95%   BMI 35.17 kg/m   Wt Readings from Last 3  Encounters:  01/28/23 214 lb 9.6 oz (97.3 kg)  01/06/23 218 lb (98.9 kg)  10/28/22 218 lb (98.9 kg)    Physical Exam Vitals and nursing note reviewed.  Constitutional:      General: She is not in acute distress.    Appearance: Normal appearance. She is not ill-appearing, toxic-appearing or diaphoretic.  HENT:     Head: Normocephalic and atraumatic.     Right Ear: Tympanic membrane, ear canal and external ear normal. There is no impacted cerumen.     Left Ear: Tympanic membrane, ear canal and external ear normal. There is no impacted cerumen.     Nose: Nose normal. No congestion or rhinorrhea.     Mouth/Throat:     Mouth: Mucous membranes are moist.     Pharynx: Oropharynx is clear. No oropharyngeal exudate or posterior oropharyngeal erythema.  Eyes:     General: No scleral icterus.       Right eye: No discharge.        Left eye: No discharge.     Extraocular Movements: Extraocular movements intact.     Conjunctiva/sclera: Conjunctivae normal.     Pupils: Pupils are equal, round, and reactive to light.  Neck:     Vascular: No carotid bruit.  Cardiovascular:     Rate and Rhythm: Normal rate and regular rhythm.     Pulses: Normal pulses.     Heart sounds: No murmur heard.    No friction rub. No gallop.  Pulmonary:  Effort: Pulmonary effort is normal. No respiratory distress.     Breath sounds: Normal breath sounds. No stridor. No wheezing, rhonchi or rales.  Chest:     Chest wall: No tenderness.  Abdominal:     General: Abdomen is flat. Bowel sounds are normal. There is no distension.     Palpations: Abdomen is soft. There is no mass.     Tenderness: There is no abdominal tenderness. There is no right CVA tenderness, left CVA tenderness, guarding or rebound.     Hernia: No hernia is present.  Genitourinary:    Comments: Breast and pelvic exams deferred with shared decision making Musculoskeletal:        General: No swelling, tenderness, deformity or signs of injury.      Cervical back: Normal range of motion and neck supple. No rigidity. No muscular tenderness.     Right lower leg: No edema.     Left lower leg: No edema.  Lymphadenopathy:     Cervical: No cervical adenopathy.  Skin:    General: Skin is warm and dry.     Capillary Refill: Capillary refill takes less than 2 seconds.     Coloration: Skin is not jaundiced or pale.     Findings: No bruising, erythema, lesion or rash.  Neurological:     General: No focal deficit present.     Mental Status: She is alert and oriented to person, place, and time. Mental status is at baseline.     Cranial Nerves: No cranial nerve deficit.     Sensory: No sensory deficit.     Motor: No weakness.     Coordination: Coordination normal.     Gait: Gait normal.     Deep Tendon Reflexes: Reflexes normal.  Psychiatric:        Mood and Affect: Mood normal.        Behavior: Behavior normal.        Thought Content: Thought content normal.        Judgment: Judgment normal.     Results for orders placed or performed in visit on 01/28/23  CBC with Differential/Platelet   Collection Time: 01/28/23  9:31 AM  Result Value Ref Range   WBC 5.4 3.4 - 10.8 x10E3/uL   RBC 4.46 3.77 - 5.28 x10E6/uL   Hemoglobin 13.4 11.1 - 15.9 g/dL   Hematocrit 58.9 65.9 - 46.6 %   MCV 92 79 - 97 fL   MCH 30.0 26.6 - 33.0 pg   MCHC 32.7 31.5 - 35.7 g/dL   RDW 86.0 88.2 - 84.5 %   Platelets 229 150 - 450 x10E3/uL   Neutrophils 66 Not Estab. %   Lymphs 23 Not Estab. %   Monocytes 9 Not Estab. %   Eos 1 Not Estab. %   Basos 1 Not Estab. %   Neutrophils Absolute 3.6 1.4 - 7.0 x10E3/uL   Lymphocytes Absolute 1.3 0.7 - 3.1 x10E3/uL   Monocytes Absolute 0.5 0.1 - 0.9 x10E3/uL   EOS (ABSOLUTE) 0.1 0.0 - 0.4 x10E3/uL   Basophils Absolute 0.0 0.0 - 0.2 x10E3/uL   Immature Granulocytes 0 Not Estab. %   Immature Grans (Abs) 0.0 0.0 - 0.1 x10E3/uL  Comprehensive metabolic panel   Collection Time: 01/28/23  9:31 AM  Result Value Ref Range    Glucose 112 (H) 70 - 99 mg/dL   BUN 13 8 - 27 mg/dL   Creatinine, Ser 9.01 0.57 - 1.00 mg/dL   eGFR 62 >40 fO/fpw/8.26   BUN/Creatinine  Ratio 13 12 - 28   Sodium 138 134 - 144 mmol/L   Potassium 4.2 3.5 - 5.2 mmol/L   Chloride 102 96 - 106 mmol/L   CO2 21 20 - 29 mmol/L   Calcium  9.2 8.7 - 10.3 mg/dL   Total Protein 6.3 6.0 - 8.5 g/dL   Albumin  4.1 3.8 - 4.8 g/dL   Globulin, Total 2.2 1.5 - 4.5 g/dL   Bilirubin Total 0.5 0.0 - 1.2 mg/dL   Alkaline Phosphatase 143 (H) 44 - 121 IU/L   AST 13 0 - 40 IU/L   ALT 13 0 - 32 IU/L  Lipid Panel w/o Chol/HDL Ratio   Collection Time: 01/28/23  9:31 AM  Result Value Ref Range   Cholesterol, Total 170 100 - 199 mg/dL   Triglycerides 870 0 - 149 mg/dL   HDL 46 >60 mg/dL   VLDL Cholesterol Cal 23 5 - 40 mg/dL   LDL Chol Calc (NIH) 898 (H) 0 - 99 mg/dL  TSH   Collection Time: 01/28/23  9:31 AM  Result Value Ref Range   TSH 5.030 (H) 0.450 - 4.500 uIU/mL  VITAMIN D  25 Hydroxy (Vit-D Deficiency, Fractures)   Collection Time: 01/28/23  9:31 AM  Result Value Ref Range   Vit D, 25-Hydroxy 13.4 (L) 30.0 - 100.0 ng/mL      Assessment & Plan:   Problem List Items Addressed This Visit       Cardiovascular and Mediastinum   Aortic atherosclerosis (HCC)   Under good control on current regimen. Continue current regimen. Continue to monitor. Call with any concerns. Refills given. Labs drawn today.       Relevant Medications   atorvastatin  (LIPITOR) 20 MG tablet   Other Relevant Orders   CBC with Differential/Platelet (Completed)   Comprehensive metabolic panel (Completed)   Senile purpura (HCC)   Reassured patient.       Relevant Medications   atorvastatin  (LIPITOR) 20 MG tablet   Other Relevant Orders   CBC with Differential/Platelet (Completed)   Comprehensive metabolic panel (Completed)     Musculoskeletal and Integument   Osteopenia   Due for repeat DEXA in February. Await results.       Relevant Orders   CBC with  Differential/Platelet (Completed)   Comprehensive metabolic panel (Completed)   TSH (Completed)   VITAMIN D  25 Hydroxy (Vit-D Deficiency, Fractures) (Completed)     Other   Anemia   Rechecking labs today. Await results. Treat as needed.       Relevant Orders   CBC with Differential/Platelet (Completed)   Comprehensive metabolic panel (Completed)   Major depressive disorder, recurrent severe without psychotic features (HCC)   Under good control on current regimen. Continue current regimen. Continue to monitor. Call with any concerns. Refills given.        Relevant Medications   buPROPion  (WELLBUTRIN  XL) 300 MG 24 hr tablet   busPIRone  (BUSPAR ) 10 MG tablet   trazodone  (DESYREL ) 300 MG tablet   Other Relevant Orders   CBC with Differential/Platelet (Completed)   Comprehensive metabolic panel (Completed)   TSH (Completed)   Hyperlipidemia   Under good control on current regimen. Continue current regimen. Continue to monitor. Call with any concerns. Refills given. Labs drawn today.       Relevant Medications   atorvastatin  (LIPITOR) 20 MG tablet   Other Relevant Orders   CBC with Differential/Platelet (Completed)   Comprehensive metabolic panel (Completed)   Lipid Panel w/o Chol/HDL Ratio (Completed)   Invasive ductal  carcinoma of right breast in female [facility])   Continue to follow with oncology. Call with any concerns.       Relevant Orders   CBC with Differential/Platelet (Completed)   Comprehensive metabolic panel (Completed)   Other Visit Diagnoses       Routine general medical examination at a health care facility    -  Primary   Vaccines up to date.. Screening labs checked today. DEXA and mammo up to date. Colonoscopy ordered today. Continue diet and exercise. Call with concerns.     DOE (dyspnea on exertion)       Spiro unable to be interpreted. Will start her on breztri  and recheck in about a month.   Relevant Medications   albuterol  (PROVENTIL ) (2.5 MG/3ML)  0.083% nebulizer solution 2.5 mg (Completed)   Other Relevant Orders   Ambulatory referral to Cardiology   Spirometry with graph (Completed)   Spirometry with graph (Completed)     Screening for colon cancer       Referral to GI placed today.   Relevant Orders   Ambulatory referral to Gastroenterology     Needs flu shot       Flu shot given today.   Relevant Orders   Flu Vaccine Trivalent High Dose (Fluad) (Completed)        Follow up plan: Return in about 4 weeks (around 02/25/2023).   LABORATORY TESTING:  - Pap smear: not applicable  IMMUNIZATIONS:   - Tdap: Tetanus vaccination status reviewed: last tetanus booster within 10 years. - Influenza: Administered today - Pneumovax: Up to date - Prevnar: Up to date - COVID: Refused - HPV: Not applicable - Shingrix  vaccine: Refused  SCREENING: -Mammogram: Up to date  - Colonoscopy: Ordered today  - Bone Density:  scheduled for February    PATIENT COUNSELING:   Advised to take 1 mg of folate supplement per day if capable of pregnancy.   Sexuality: Discussed sexually transmitted diseases, partner selection, use of condoms, avoidance of unintended pregnancy  and contraceptive alternatives.   Advised to avoid cigarette smoking.  I discussed with the patient that most people either abstain from alcohol or drink within safe limits (<=14/week and <=4 drinks/occasion for males, <=7/weeks and <= 3 drinks/occasion for females) and that the risk for alcohol disorders and other health effects rises proportionally with the number of drinks per week and how often a drinker exceeds daily limits.  Discussed cessation/primary prevention of drug use and availability of treatment for abuse.   Diet: Encouraged to adjust caloric intake to maintain  or achieve ideal body weight, to reduce intake of dietary saturated fat and total fat, to limit sodium intake by avoiding high sodium foods and not adding table salt, and to maintain adequate dietary  potassium and calcium  preferably from fresh fruits, vegetables, and low-fat dairy products.    stressed the importance of regular exercise  Injury prevention: Discussed safety belts, safety helmets, smoke detector, smoking near bedding or upholstery.   Dental health: Discussed importance of regular tooth brushing, flossing, and dental visits.    NEXT PREVENTATIVE PHYSICAL DUE IN 1 YEAR. Return in about 4 weeks (around 02/25/2023).

## 2023-01-29 LAB — CBC WITH DIFFERENTIAL/PLATELET
Basophils Absolute: 0 10*3/uL (ref 0.0–0.2)
Basos: 1 %
EOS (ABSOLUTE): 0.1 10*3/uL (ref 0.0–0.4)
Eos: 1 %
Hematocrit: 41 % (ref 34.0–46.6)
Hemoglobin: 13.4 g/dL (ref 11.1–15.9)
Immature Grans (Abs): 0 10*3/uL (ref 0.0–0.1)
Immature Granulocytes: 0 %
Lymphocytes Absolute: 1.3 10*3/uL (ref 0.7–3.1)
Lymphs: 23 %
MCH: 30 pg (ref 26.6–33.0)
MCHC: 32.7 g/dL (ref 31.5–35.7)
MCV: 92 fL (ref 79–97)
Monocytes Absolute: 0.5 10*3/uL (ref 0.1–0.9)
Monocytes: 9 %
Neutrophils Absolute: 3.6 10*3/uL (ref 1.4–7.0)
Neutrophils: 66 %
Platelets: 229 10*3/uL (ref 150–450)
RBC: 4.46 x10E6/uL (ref 3.77–5.28)
RDW: 13.9 % (ref 11.7–15.4)
WBC: 5.4 10*3/uL (ref 3.4–10.8)

## 2023-01-29 LAB — COMPREHENSIVE METABOLIC PANEL
ALT: 13 [IU]/L (ref 0–32)
AST: 13 [IU]/L (ref 0–40)
Albumin: 4.1 g/dL (ref 3.8–4.8)
Alkaline Phosphatase: 143 [IU]/L — ABNORMAL HIGH (ref 44–121)
BUN/Creatinine Ratio: 13 (ref 12–28)
BUN: 13 mg/dL (ref 8–27)
Bilirubin Total: 0.5 mg/dL (ref 0.0–1.2)
CO2: 21 mmol/L (ref 20–29)
Calcium: 9.2 mg/dL (ref 8.7–10.3)
Chloride: 102 mmol/L (ref 96–106)
Creatinine, Ser: 0.98 mg/dL (ref 0.57–1.00)
Globulin, Total: 2.2 g/dL (ref 1.5–4.5)
Glucose: 112 mg/dL — ABNORMAL HIGH (ref 70–99)
Potassium: 4.2 mmol/L (ref 3.5–5.2)
Sodium: 138 mmol/L (ref 134–144)
Total Protein: 6.3 g/dL (ref 6.0–8.5)
eGFR: 62 mL/min/{1.73_m2} (ref >59)

## 2023-01-29 LAB — VITAMIN D 25 HYDROXY (VIT D DEFICIENCY, FRACTURES): Vit D, 25-Hydroxy: 13.4 ng/mL — ABNORMAL LOW (ref 30.0–100.0)

## 2023-01-29 LAB — LIPID PANEL W/O CHOL/HDL RATIO
Cholesterol, Total: 170 mg/dL (ref 100–199)
HDL: 46 mg/dL (ref >39)
LDL Chol Calc (NIH): 101 mg/dL — ABNORMAL HIGH (ref 0–99)
Triglycerides: 129 mg/dL (ref 0–149)
VLDL Cholesterol Cal: 23 mg/dL (ref 5–40)

## 2023-01-29 LAB — TSH: TSH: 5.03 u[IU]/mL — ABNORMAL HIGH (ref 0.450–4.500)

## 2023-02-04 ENCOUNTER — Encounter: Payer: Self-pay | Admitting: Family Medicine

## 2023-02-04 ENCOUNTER — Other Ambulatory Visit: Payer: Self-pay | Admitting: Family Medicine

## 2023-02-04 DIAGNOSIS — E559 Vitamin D deficiency, unspecified: Secondary | ICD-10-CM | POA: Insufficient documentation

## 2023-02-04 MED ORDER — VITAMIN D (ERGOCALCIFEROL) 1.25 MG (50000 UNIT) PO CAPS: 50000.0000 [IU] | ORAL_CAPSULE | ORAL | 1 refills | Status: DC

## 2023-02-06 ENCOUNTER — Encounter: Payer: Self-pay | Admitting: Family Medicine

## 2023-02-06 NOTE — Assessment & Plan Note (Signed)
 Under good control on current regimen. Continue current regimen. Continue to monitor. Call with any concerns. Refills given. Labs drawn today.

## 2023-02-06 NOTE — Assessment & Plan Note (Signed)
 Rechecking labs today. Await results. Treat as needed.

## 2023-02-06 NOTE — Assessment & Plan Note (Signed)
 Continue to follow with oncology. Call with any concerns.

## 2023-02-06 NOTE — Assessment & Plan Note (Signed)
 Due for repeat DEXA in February. Await results.

## 2023-02-06 NOTE — Assessment & Plan Note (Signed)
 Reassured patient.

## 2023-02-06 NOTE — Assessment & Plan Note (Signed)
 Under good control on current regimen. Continue current regimen. Continue to monitor. Call with any concerns. Refills given.

## 2023-02-14 ENCOUNTER — Encounter: Payer: Self-pay | Admitting: *Deleted

## 2023-02-28 ENCOUNTER — Ambulatory Visit (INDEPENDENT_AMBULATORY_CARE_PROVIDER_SITE_OTHER): Payer: Medicare Other | Admitting: Family Medicine

## 2023-02-28 ENCOUNTER — Encounter: Payer: Self-pay | Admitting: Family Medicine

## 2023-02-28 VITALS — BP 138/91 | HR 73 | Ht 65.5 in | Wt 216.0 lb

## 2023-02-28 DIAGNOSIS — R0609 Other forms of dyspnea: Secondary | ICD-10-CM | POA: Diagnosis not present

## 2023-02-28 DIAGNOSIS — I700 Atherosclerosis of aorta: Secondary | ICD-10-CM

## 2023-02-28 DIAGNOSIS — E039 Hypothyroidism, unspecified: Secondary | ICD-10-CM | POA: Diagnosis not present

## 2023-02-28 DIAGNOSIS — Z1211 Encounter for screening for malignant neoplasm of colon: Secondary | ICD-10-CM | POA: Diagnosis not present

## 2023-02-28 NOTE — Assessment & Plan Note (Signed)
Will keep BP and cholesterol under good control. Continue to monitor.  

## 2023-02-28 NOTE — Progress Notes (Signed)
BP (!) 138/91   Pulse 73   Ht 5' 5.5" (1.664 m)   Wt 216 lb (98 kg)   LMP  (LMP Unknown)   SpO2 97%   BMI 35.40 kg/m    Subjective:    Patient ID: Chelsey [name], female    DOB: 09/11/1950, 73 y.o.   MRN: [id number]  HPI: Chelsey [name] is a 73 y.o. female  Chief Complaint  Patient presents with   Shortness of Breath    Patient says she still having some shortness of breath since her last in office visit and start of the inhalers. Patient says she not only feels shortness of breath, but she feels tired and weak after doing anything. Patient says the inhaler helps a little bit.    SHORTNESS OF BREATH- slightly better with the breztri, but not significantly Duration: at least a year Onset: gradual Description of breathing discomfort: can't catch her breath, especially with movement Severity: moderate Episode duration: minutes Frequency: with activity Related to exertion: yes Cough: no Chest tightness: yes Wheezing: no Fevers: no Chest pain: no Palpitations: no  Nausea: no Diaphoresis: no Deconditioning: yes Status: worse  Relevant past medical, surgical, family and social history reviewed and updated as indicated. Interim medical history since our last visit reviewed. Allergies and medications reviewed and updated.  Review of Systems  Constitutional:  Positive for fatigue. Negative for activity change, appetite change, chills, diaphoresis, fever and unexpected weight change.  HENT: Negative.    Respiratory:  Positive for chest tightness and shortness of breath. Negative for apnea, cough, choking, wheezing and stridor.   Cardiovascular: Negative.   Gastrointestinal: Negative.   Neurological:  Positive for weakness.  Psychiatric/Behavioral: Negative.      Per HPI unless specifically indicated above     Objective:    BP (!) 138/91   Pulse 73   Ht 5' 5.5" (1.664 m)   Wt 216 lb (98 kg)   LMP  (LMP Unknown)   SpO2 97%   BMI 35.40 kg/m   Wt Readings from  Last 3 Encounters:  02/28/23 216 lb (98 kg)  01/28/23 214 lb 9.6 oz (97.3 kg)  01/06/23 218 lb (98.9 kg)    Physical Exam Vitals and nursing note reviewed.  Constitutional:      General: She is not in acute distress.    Appearance: Normal appearance. She is well-developed. She is obese. She is not ill-appearing, toxic-appearing or diaphoretic.  HENT:     Head: Normocephalic and atraumatic.     Right Ear: External ear normal.     Left Ear: External ear normal.     Nose: Nose normal.     Mouth/Throat:     Mouth: Mucous membranes are moist.     Pharynx: Oropharynx is clear.  Eyes:     General: No scleral icterus.       Right eye: No discharge.        Left eye: No discharge.     Extraocular Movements: Extraocular movements intact.     Conjunctiva/sclera: Conjunctivae normal.     Pupils: Pupils are equal, round, and reactive to light.  Cardiovascular:     Rate and Rhythm: Normal rate and regular rhythm.     Pulses: Normal pulses.     Heart sounds: Normal heart sounds. No murmur heard.    No friction rub. No gallop.  Pulmonary:     Effort: Pulmonary effort is normal. No respiratory distress.     Breath sounds: Normal breath sounds.  No stridor. No wheezing, rhonchi or rales.  Chest:     Chest wall: No tenderness.  Musculoskeletal:        General: Normal range of motion.     Cervical back: Normal range of motion and neck supple.  Skin:    General: Skin is warm and dry.     Capillary Refill: Capillary refill takes less than 2 seconds.     Coloration: Skin is not jaundiced or pale.     Findings: No bruising, erythema, lesion or rash.  Neurological:     General: No focal deficit present.     Mental Status: She is alert and oriented to person, place, and time. Mental status is at baseline.  Psychiatric:        Mood and Affect: Mood normal.        Behavior: Behavior normal.        Thought Content: Thought content normal.        Judgment: Judgment normal.     Results for  orders placed or performed in visit on 01/28/23  CBC with Differential/Platelet   Collection Time: 01/28/23  9:31 AM  Result Value Ref Range   WBC 5.4 3.4 - 10.8 x10E3/uL   RBC 4.46 3.77 - 5.28 x10E6/uL   Hemoglobin 13.4 11.1 - 15.9 g/dL   Hematocrit 16.1 09.6 - 46.6 %   MCV 92 79 - 97 fL   MCH 30.0 26.6 - 33.0 pg   MCHC 32.7 31.5 - 35.7 g/dL   RDW 04.5 40.9 - 81.1 %   Platelets 229 150 - 450 x10E3/uL   Neutrophils 66 Not Estab. %   Lymphs 23 Not Estab. %   Monocytes 9 Not Estab. %   Eos 1 Not Estab. %   Basos 1 Not Estab. %   Neutrophils Absolute 3.6 1.4 - 7.0 x10E3/uL   Lymphocytes Absolute 1.3 0.7 - 3.1 x10E3/uL   Monocytes Absolute 0.5 0.1 - 0.9 x10E3/uL   EOS (ABSOLUTE) 0.1 0.0 - 0.4 x10E3/uL   Basophils Absolute 0.0 0.0 - 0.2 x10E3/uL   Immature Granulocytes 0 Not Estab. %   Immature Grans (Abs) 0.0 0.0 - 0.1 x10E3/uL  Comprehensive metabolic panel   Collection Time: 01/28/23  9:31 AM  Result Value Ref Range   Glucose 112 (H) 70 - 99 mg/dL   BUN 13 8 - 27 mg/dL   Creatinine, Ser 9.14 0.57 - 1.00 mg/dL   eGFR 62 >78 GN/FAO/1.30   BUN/Creatinine Ratio 13 12 - 28   Sodium 138 134 - 144 mmol/L   Potassium 4.2 3.5 - 5.2 mmol/L   Chloride 102 96 - 106 mmol/L   CO2 21 20 - 29 mmol/L   Calcium 9.2 8.7 - 10.3 mg/dL   Total Protein 6.3 6.0 - 8.5 g/dL   Albumin 4.1 3.8 - 4.8 g/dL   Globulin, Total 2.2 1.5 - 4.5 g/dL   Bilirubin Total 0.5 0.0 - 1.2 mg/dL   Alkaline Phosphatase 143 (H) 44 - 121 IU/L   AST 13 0 - 40 IU/L   ALT 13 0 - 32 IU/L  Lipid Panel w/o Chol/HDL Ratio   Collection Time: 01/28/23  9:31 AM  Result Value Ref Range   Cholesterol, Total 170 100 - 199 mg/dL   Triglycerides 865 0 - 149 mg/dL   HDL 46 >78 mg/dL   VLDL Cholesterol Cal 23 5 - 40 mg/dL   LDL Chol Calc (NIH) 469 (H) 0 - 99 mg/dL  TSH   Collection Time: 01/28/23  9:31 AM  Result Value Ref Range   TSH 5.030 (H) 0.450 - 4.500 uIU/mL  VITAMIN D 25 Hydroxy (Vit-D Deficiency, Fractures)    Collection Time: 01/28/23  9:31 AM  Result Value Ref Range   Vit D, 25-Hydroxy 13.4 (L) 30.0 - 100.0 ng/mL      Assessment & Plan:   Problem List Items Addressed This Visit       Cardiovascular and Mediastinum   Aortic atherosclerosis (HCC)   Will keep BP and cholesterol under good control. Continue to monitor.       Relevant Orders   Ambulatory referral to Cardiology   Other Visit Diagnoses       DOE (dyspnea on exertion)    -  Primary   No better with breztri. Will get her into cardiology. Call with any concerns.   Relevant Orders   Ambulatory referral to Cardiology   EKG 12-Lead     Screening for colon cancer       Referral to GI placed today.   Relevant Orders   Ambulatory referral to Gastroenterology     Hypothyroidism, unspecified type       Rechecking labs today. Await results. Treat as needed.   Relevant Orders   Thyroid Profile([facility])        Follow up plan: Return in about 2 months (around 04/28/2023).

## 2023-03-01 LAB — THYROID PANEL
Free Thyroxine Index: 1.7 (ref 1.2–4.9)
T3 Uptake Ratio: 23 % — ABNORMAL LOW (ref 24–39)
T4, Total: 7.4 ug/dL (ref 4.5–12.0)

## 2023-03-02 ENCOUNTER — Encounter: Payer: Self-pay | Admitting: Family Medicine

## 2023-03-02 NOTE — Progress Notes (Signed)
Results printed and mailed.   

## 2023-03-03 ENCOUNTER — Telehealth: Payer: Self-pay

## 2023-03-03 ENCOUNTER — Other Ambulatory Visit: Payer: Self-pay

## 2023-03-03 DIAGNOSIS — Z860100 Personal history of colon polyps, unspecified: Secondary | ICD-10-CM

## 2023-03-03 MED ORDER — NA SULFATE-K SULFATE-MG SULF 17.5-3.13-1.6 GM/177ML PO SOLN: 1.0000 | Freq: Once | ORAL | 0 refills | Status: AC

## 2023-03-03 NOTE — Telephone Encounter (Signed)
 Gastroenterology Pre-Procedure Review  Request Date: 03/23/23 Requesting Physician: Dr. Therisa  PATIENT REVIEW QUESTIONS: The patient responded to the following health history questions as indicated:    1. Are you having any GI issues? no 2. Do you have a personal history of Polyps? Yes last colonoscopy performed by Dr. Therisa 01/05/18 recommended repeat in 5 years 3. Do you have a family history of Colon Cancer or Polyps? no 4. Diabetes Mellitus? no 5. Joint replacements in the past 12 months?no 6. Major health problems in the past 3 months?no 7. Any artificial heart valves, MVP, or defibrillator?no    MEDICATIONS & ALLERGIES:    Patient reports the following regarding taking any anticoagulation/antiplatelet therapy:   Plavix, Coumadin, Eliquis, Xarelto, Lovenox , Pradaxa, Brilinta, or Effient? no Aspirin ? no  Patient confirms/reports the following medications:  Current Outpatient Medications  Medication Sig Dispense Refill   atorvastatin  (LIPITOR) 20 MG tablet Take 1 tablet (20 mg total) by mouth daily. 90 tablet 1   Budeson-Glycopyrrol-Formoterol  (BREZTRI  AEROSPHERE) 160-9-4.8 MCG/ACT AERO Inhale 2 puffs into the lungs 2 (two) times daily.     Budeson-Glycopyrrol-Formoterol  (BREZTRI  AEROSPHERE) 160-9-4.8 MCG/ACT AERO Inhale 2 puffs into the lungs 2 (two) times daily. 10.7 g 11   buPROPion  (WELLBUTRIN  XL) 300 MG 24 hr tablet One tab Once a day 90 tablet 1   busPIRone  (BUSPAR ) 10 MG tablet Take 1 tablet (10 mg total) by mouth 2 (two) times daily. 180 tablet 1   letrozole  (FEMARA ) 2.5 MG tablet Take 1 tablet (2.5 mg total) by mouth daily. 90 tablet 3   pantoprazole  (PROTONIX ) 40 MG tablet Take 1 tablet (40 mg total) by mouth daily. 90 tablet 3   Spacer/Aero-Holding Chambers DEVI 1 each by Does not apply route as needed. Dx:R06.09 1 each 3   trazodone  (DESYREL ) 300 MG tablet TAKE 1 TABLET(300 MG) BY MOUTH AT BEDTIME 90 tablet 1   Vitamin D , Ergocalciferol , (DRISDOL ) 1.25 MG (50000 UNIT)  CAPS capsule Take 1 capsule (50,000 Units total) by mouth every 7 (seven) days. 12 capsule 1   No current facility-administered medications for this visit.   Facility-Administered Medications Ordered in Other Visits  Medication Dose Route Frequency Provider Last Rate Last Admin   lidocaine  (PF) (XYLOCAINE ) 1 % injection 10 mL  10 mL Other Once Sakai, Isami, DO        Patient confirms/reports the following allergies:  No Known Allergies  No orders of the defined types were placed in this encounter.   AUTHORIZATION INFORMATION Primary Insurance: 1D#: Group #:  Secondary Insurance: 1D#: Group #:  SCHEDULE INFORMATION: Date: 03/23/23 Time: Location: [facility]

## 2023-03-21 ENCOUNTER — Ambulatory Visit
Admission: RE | Admit: 2023-03-21 | Discharge: 2023-03-21 | Disposition: A | Discharge status: Home / Self Care | Payer: Medicare Other | Source: Ambulatory Visit | Attending: Oncology | Admitting: Oncology

## 2023-03-21 DIAGNOSIS — F17200 Nicotine dependence, unspecified, uncomplicated: Secondary | ICD-10-CM | POA: Insufficient documentation

## 2023-03-21 DIAGNOSIS — Z853 Personal history of malignant neoplasm of breast: Secondary | ICD-10-CM | POA: Insufficient documentation

## 2023-03-21 DIAGNOSIS — M85852 Other specified disorders of bone density and structure, left thigh: Secondary | ICD-10-CM | POA: Diagnosis not present

## 2023-03-21 DIAGNOSIS — M8589 Other specified disorders of bone density and structure, multiple sites: Secondary | ICD-10-CM | POA: Diagnosis not present

## 2023-03-21 DIAGNOSIS — Z780 Asymptomatic menopausal state: Secondary | ICD-10-CM | POA: Diagnosis not present

## 2023-03-21 DIAGNOSIS — C50911 Malignant neoplasm of unspecified site of right female breast: Secondary | ICD-10-CM | POA: Diagnosis present

## 2023-03-23 ENCOUNTER — Ambulatory Visit
Admission: RE | Admit: 2023-03-23 | Discharge: 2023-03-23 | Disposition: A | Discharge status: Home / Self Care | Payer: Medicare Other | Attending: Gastroenterology | Admitting: Gastroenterology

## 2023-03-23 ENCOUNTER — Encounter
Admission: RE | Disposition: A | Discharge status: Home / Self Care | Payer: Self-pay | Source: Home / Self Care | Attending: Gastroenterology

## 2023-03-23 ENCOUNTER — Ambulatory Visit: Payer: Medicare Other

## 2023-03-23 ENCOUNTER — Encounter: Payer: Self-pay | Admitting: Gastroenterology

## 2023-03-23 ENCOUNTER — Other Ambulatory Visit: Payer: Self-pay

## 2023-03-23 DIAGNOSIS — I739 Peripheral vascular disease, unspecified: Secondary | ICD-10-CM | POA: Diagnosis not present

## 2023-03-23 DIAGNOSIS — K649 Unspecified hemorrhoids: Secondary | ICD-10-CM | POA: Diagnosis not present

## 2023-03-23 DIAGNOSIS — F17210 Nicotine dependence, cigarettes, uncomplicated: Secondary | ICD-10-CM | POA: Diagnosis not present

## 2023-03-23 DIAGNOSIS — K640 First degree hemorrhoids: Secondary | ICD-10-CM | POA: Diagnosis not present

## 2023-03-23 DIAGNOSIS — E785 Hyperlipidemia, unspecified: Secondary | ICD-10-CM | POA: Diagnosis not present

## 2023-03-23 DIAGNOSIS — I209 Angina pectoris, unspecified: Secondary | ICD-10-CM | POA: Diagnosis not present

## 2023-03-23 DIAGNOSIS — K5730 Diverticulosis of large intestine without perforation or abscess without bleeding: Secondary | ICD-10-CM | POA: Insufficient documentation

## 2023-03-23 DIAGNOSIS — K635 Polyp of colon: Secondary | ICD-10-CM | POA: Insufficient documentation

## 2023-03-23 DIAGNOSIS — Z860100 Personal history of colon polyps, unspecified: Secondary | ICD-10-CM

## 2023-03-23 DIAGNOSIS — K449 Diaphragmatic hernia without obstruction or gangrene: Secondary | ICD-10-CM | POA: Insufficient documentation

## 2023-03-23 DIAGNOSIS — K219 Gastro-esophageal reflux disease without esophagitis: Secondary | ICD-10-CM | POA: Diagnosis not present

## 2023-03-23 DIAGNOSIS — D124 Benign neoplasm of descending colon: Secondary | ICD-10-CM | POA: Insufficient documentation

## 2023-03-23 DIAGNOSIS — D126 Benign neoplasm of colon, unspecified: Secondary | ICD-10-CM

## 2023-03-23 DIAGNOSIS — Z1211 Encounter for screening for malignant neoplasm of colon: Secondary | ICD-10-CM | POA: Insufficient documentation

## 2023-03-23 HISTORY — PX: COLONOSCOPY WITH PROPOFOL: SHX5780

## 2023-03-23 HISTORY — PX: POLYPECTOMY: SHX5525

## 2023-03-23 SURGERY — COLONOSCOPY WITH PROPOFOL
Anesthesia: General

## 2023-03-23 MED ORDER — SODIUM CHLORIDE 0.9% FLUSH: 3.0000 mL | INTRAVENOUS | Status: DC | PRN

## 2023-03-23 MED ORDER — SODIUM CHLORIDE 0.9 % IV SOLN: INTRAVENOUS | Status: DC

## 2023-03-23 MED ORDER — SODIUM CHLORIDE 0.9% FLUSH
3.0000 mL | Freq: Two times a day (BID) | INTRAVENOUS | Status: DC
Start: 2023-03-23 — End: 2023-03-23

## 2023-03-23 MED ORDER — PROPOFOL 500 MG/50ML IV EMUL
INTRAVENOUS | Status: DC | PRN
  Administered 2023-03-23: 50 mg via INTRAVENOUS
  Administered 2023-03-23: 200 ug/kg/min via INTRAVENOUS

## 2023-03-23 NOTE — H&P (Signed)
 Wyline Mood, MD [address], [place], [place], [address] [address], [place], [place], [address] Phone: [phone]  Fax: [phone]  Primary Care Physician:  Dorcas Carrow, DO   Pre-Procedure History & Physical: HPI:  Chelsey [name] is a 73 y.o. female is here for an colonoscopy.   Past Medical History:  Diagnosis Date   Aortic atherosclerosis (HCC)    Breast cancer (HCC) 1997   right breast, radiation   Bronchitis    recent/ 06/09/15 had chest xray/ [name]/resolved   Cancer [facility]) 1997   right lumpectomy,L/Ad/R    GERD (gastroesophageal reflux disease)    History of hiatal hernia    Hypercholesteremia    Hyperlipidemia    Low BP    TYPICALLY RUNS 80'S/60'S   Osteopenia    Panic attack    Paraesophageal hernia    Personal history of malignant neoplasm of breast    Personal history of radiation therapy     Past Surgical History:  Procedure Laterality Date   BICEPT TENODESIS Left 11/23/2016   Procedure: BICEPS TENODESIS;  Surgeon: Signa Kell, MD;  Location: [facility];  Service: Orthopedics;  Laterality: Left;   BREAST BIOPSY Right 12/21/2021   MM RT RADIO FREQUENCY TAG LOC MAMMO GUIDE 12/21/2021 [facility]   BREAST EXCISIONAL BIOPSY Right 1997   pos   BREAST LUMPECTOMY Right 12/25/2021   breast cancer   BREAST SURGERY Right 1997   lumpectomy   CHOLECYSTECTOMY N/A 08/06/2016   Procedure: LAPAROSCOPIC CHOLECYSTECTOMY WITH INTRAOPERATIVE CHOLANGIOGRAM;  Surgeon: Kieth Brightly, MD;  Location: [facility];  Service: General;  Laterality: N/A;   COLONOSCOPY  2008   COLONOSCOPY WITH PROPOFOL N/A 07/21/2015   Procedure: COLONOSCOPY WITH PROPOFOL;  Surgeon: Midge Minium, MD;  Location: [facility];  Service: Endoscopy;  Laterality: N/A;   COLONOSCOPY WITH PROPOFOL N/A 01/05/2018   Procedure: COLONOSCOPY WITH PROPOFOL;  Surgeon: Wyline Mood, MD;  Location: [facility];  Service: Gastroenterology;  Laterality:  N/A;   ESOPHAGOGASTRODUODENOSCOPY (EGD) WITH PROPOFOL N/A 06/16/2016   Procedure: ESOPHAGOGASTRODUODENOSCOPY (EGD) WITH PROPOFOL;  Surgeon: Kieth Brightly, MD;  Location: [facility];  Service: Endoscopy;  Laterality: N/A;   ESOPHAGOGASTRODUODENOSCOPY (EGD) WITH PROPOFOL N/A 01/05/2018   Procedure: ESOPHAGOGASTRODUODENOSCOPY (EGD) WITH PROPOFOL;  Surgeon: Wyline Mood, MD;  Location: [facility] ENDOSCOPY;  Service: Gastroenterology;  Laterality: N/A;   ESOPHAGOGASTRODUODENOSCOPY (EGD) WITH PROPOFOL N/A 06/13/2018   Procedure: ESOPHAGOGASTRODUODENOSCOPY (EGD) WITH PROPOFOL;  Surgeon: Wyline Mood, MD;  Location: [facility];  Service: Gastroenterology;  Laterality: N/A;   ESOPHAGOGASTRODUODENOSCOPY (EGD) WITH PROPOFOL N/A 07/16/2018   Procedure: ESOPHAGOGASTRODUODENOSCOPY (EGD) WITH PROPOFOL;  Surgeon: Midge Minium, MD;  Location: [facility];  Service: Endoscopy;  Laterality: N/A;   HARDWARE REMOVAL Left 12/27/2016   Procedure: HARDWARE REMOVAL LEFT  PROXIMAL HUMEROUS;  Surgeon: Signa Kell, MD;  Location: [facility];  Service: Orthopedics;  Laterality: Left;   IR GJ TUBE CHANGE  04/21/2018   NISSEN FUNDOPLICATION N/A 03/09/2018   Procedure: NISSEN FUNDOPLICATION;  Surgeon: Leafy Ro, MD;  Location: [facility];  Service: General;  Laterality: N/A;   ORIF HUMERUS FRACTURE Left 11/23/2016   Procedure: OPEN REDUCTION INTERNAL FIXATION (ORIF) PROXIMAL HUMERUS FRACTURE;  Surgeon: Signa Kell, MD;  Location: [facility];  Service: Orthopedics;  Laterality: Left;   PART MASTECTOMY,RADIO FREQUENCY LOCALIZER,AXILLARY SENTINEL NODE BIOPSY Right 12/25/2021   Procedure: PART MASTECTOMY,RADIO FREQUENCY LOCALIZER,AXILLARY SENTINEL NODE BIOPSY;  Surgeon: [name], DO;  Location: [facility];  Service: General;  Laterality: Right;  REPAIR OF ESOPHAGUS  03/09/2018   Procedure: REPAIR OF ESOPHAGUS;  Surgeon: Leafy Ro, MD;  Location: [facility];  Service: General;;   REVERSE SHOULDER ARTHROPLASTY  Left 12/27/2016   Procedure: REVERSE SHOULDER ARTHROPLASTY;  Surgeon: Signa Kell, MD;  Location: [facility];  Service: Orthopedics;  Laterality: Left;   ROBOTIC ASSISTED LAPAROSCOPIC REPAIR OF PARAESOPHAGEAL HERNIA N/A 03/09/2018   Procedure: ROBOTIC HIATAL HERNIA CONVERTED TO OPEN;  Surgeon: Leafy Ro, MD;  Location: [facility];  Service: General;  Laterality: N/A;    Prior to Admission medications   Medication Sig Start Date End Date Taking? Authorizing Provider  atorvastatin (LIPITOR) 20 MG tablet Take 1 tablet (20 mg total) by mouth daily. 01/28/23  Yes Johnson, Megan P, DO  Budeson-Glycopyrrol-Formoterol (BREZTRI AEROSPHERE) 160-9-4.8 MCG/ACT AERO Inhale 2 puffs into the lungs 2 (two) times daily. 01/28/23  Yes Johnson, Megan P, DO  Budeson-Glycopyrrol-Formoterol (BREZTRI AEROSPHERE) 160-9-4.8 MCG/ACT AERO Inhale 2 puffs into the lungs 2 (two) times daily. 01/28/23  Yes Johnson, Megan P, DO  buPROPion (WELLBUTRIN XL) 300 MG 24 hr tablet One tab Once a day 01/28/23  Yes Johnson, Megan P, DO  busPIRone (BUSPAR) 10 MG tablet Take 1 tablet (10 mg total) by mouth 2 (two) times daily. 01/28/23  Yes Johnson, Megan P, DO  letrozole (FEMARA) 2.5 MG tablet Take 1 tablet (2.5 mg total) by mouth daily. 01/12/22  Yes Jeralyn Ruths, MD  pantoprazole (PROTONIX) 40 MG tablet Take 1 tablet (40 mg total) by mouth daily. 07/27/22  Yes Johnson, Megan P, DO  Spacer/Aero-Holding Chambers DEVI 1 each by Does not apply route as needed. Dx:R06.09 01/28/23  Yes Johnson, Megan P, DO  trazodone (DESYREL) 300 MG tablet TAKE 1 TABLET(300 MG) BY MOUTH AT BEDTIME 01/28/23  Yes Johnson, Megan P, DO  Vitamin D, Ergocalciferol, (DRISDOL) 1.25 MG (50000 UNIT) CAPS capsule Take 1 capsule (50,000 Units total) by mouth every 7 (seven) days. 02/04/23  Yes Johnson, Megan P, DO    Allergies as of 03/03/2023   (No Known Allergies)    Family History  Problem Relation Age of Onset   Kidney disease Mother    Cancer Father        stomach    Heart attack Father    Hypertension Father    Alcohol abuse Father    Anxiety disorder Brother    Obesity Brother    COPD Brother    Depression Brother    Anxiety disorder Brother    Cancer Maternal Grandmother    Breast cancer Maternal Grandmother     Social History   Socioeconomic History   Marital status: Widowed    Spouse name: Not on file   Number of children: 2   Years of education: Not on file   Highest education level: Bachelor's degree (e.g., BA, AB, BS)  Occupational History   Occupation: retired  Tobacco Use   Smoking status: Every Day    Current packs/day: 0.15    Average packs/day: 0.2 packs/day for 13.3 years (2.0 ttl pk-yrs)    Types: Cigarettes    Start date: 11/25/2009   Smokeless tobacco: Never   Tobacco comments:    3 cigarette daily  Vaping Use   Vaping status: Never Used  Substance and Sexual Activity   Alcohol use: No    Alcohol/week: 0.0 standard drinks of alcohol   Drug use: No   Sexual activity: Never  Other Topics Concern   Not on file  Social History Narrative   Lives  with son   Social Drivers of Corporate investment banker Strain: Low Risk  (01/06/2023)   Overall Financial Resource Strain (CARDIA)    Difficulty of Paying Living Expenses: Not very hard  Food Insecurity: No Food Insecurity (01/06/2023)   Hunger Vital Sign    Worried About Running Out of Food in the Last Year: Never true    Ran Out of Food in the Last Year: Never true  Transportation Needs: No Transportation Needs (01/06/2023)   PRAPARE - Administrator, Civil Service (Medical): No    Lack of Transportation (Non-Medical): No  Physical Activity: Inactive (01/06/2023)   Exercise Vital Sign    Days of Exercise per Week: 0 days    Minutes of Exercise per Session: 0 min  Stress: No Stress Concern Present (01/06/2023)   [employer] of Occupational Health - Occupational Stress Questionnaire    Feeling of Stress : Not at all  Social Connections:  Socially Isolated (01/06/2023)   Social Connection and Isolation Panel [NHANES]    Frequency of Communication with Friends and Family: Three times a week    Frequency of Social Gatherings with Friends and Family: Once a week    Attends Religious Services: Never    Database administrator or Organizations: No    Attends Banker Meetings: Never    Marital Status: Widowed  Intimate Partner Violence: Not At Risk (01/06/2023)   Humiliation, Afraid, Rape, and Kick questionnaire    Fear of Current or Ex-Partner: No    Emotionally Abused: No    Physically Abused: No    Sexually Abused: No    Review of Systems: See HPI, otherwise negative ROS  Physical Exam: BP 108/82   Pulse 95   Temp (!) 97 F (36.1 C) (Temporal)   Resp 18   Ht 5\' 5"  (1.651 m)   Wt 94.3 kg   LMP  (LMP Unknown)   SpO2 97%   BMI 34.61 kg/m  General:   Alert,  pleasant and cooperative in NAD Head:  Normocephalic and atraumatic. Neck:  Supple; no masses or thyromegaly. Lungs:  Clear throughout to auscultation, normal respiratory effort.    Heart:  +S1, +S2, Regular rate and rhythm, No edema. Abdomen:  Soft, nontender and nondistended. Normal bowel sounds, without guarding, and without rebound.   Neurologic:  Alert and  oriented x4;  grossly normal neurologically.  Impression/Plan: MAYGEN SIRICO is here for an colonoscopy to be performed for surveillance due to prior history of colon polyps   Risks, benefits, limitations, and alternatives regarding  colonoscopy have been reviewed with the patient.  Questions have been answered.  All parties agreeable.   Wyline Mood, MD  03/23/2023, 12:40 PM

## 2023-03-23 NOTE — Anesthesia Preprocedure Evaluation (Signed)
 Anesthesia Evaluation  Patient identified by MRN, date of birth, ID band Patient awake    Reviewed: Allergy & Precautions, NPO status , Patient's Chart, lab work & pertinent test results  History of Anesthesia Complications Negative for: history of anesthetic complications  Airway Mallampati: III  TM Distance: <3 FB Neck ROM: full    Dental  (+) Chipped, Poor Dentition, Missing   Pulmonary neg shortness of breath, COPD, Current Smoker and Patient abstained from smoking.   Pulmonary exam normal        Cardiovascular Exercise Tolerance: Good + angina  + Peripheral Vascular Disease  Normal cardiovascular exam     Neuro/Psych  PSYCHIATRIC DISORDERS       Neuromuscular disease    GI/Hepatic Neg liver ROS, hiatal hernia,GERD  Controlled,,  Endo/Other  negative endocrine ROS    Renal/GU negative Renal ROS  negative genitourinary   Musculoskeletal   Abdominal   Peds  Hematology negative hematology ROS (+)   Anesthesia Other Findings Past Medical History: No date: Aortic atherosclerosis (HCC) 1997: Breast cancer (HCC)     Comment:  right breast, radiation No date: Bronchitis     Comment:  recent/ 06/09/15 had chest xray/ [name]               care/resolved 1997: Cancer [facility])     Comment:  right lumpectomy,L/Ad/R  No date: GERD (gastroesophageal reflux disease) No date: History of hiatal hernia No date: Hypercholesteremia No date: Hyperlipidemia No date: Low BP     Comment:  TYPICALLY RUNS 80'S/60'S No date: Osteopenia No date: Panic attack No date: Paraesophageal hernia No date: Personal history of malignant neoplasm of breast No date: Personal history of radiation therapy  Past Surgical History: 11/23/2016: BICEPT TENODESIS; Left     Comment:  Procedure: BICEPS TENODESIS;  Surgeon: Signa Kell, MD;              Location: [facility] ORS;  Service: Orthopedics;  Laterality:               Left; 12/21/2021:  BREAST BIOPSY; Right     Comment:  MM RT RADIO FREQUENCY TAG LOC MAMMO GUIDE 12/21/2021               [facility]-MAMMO[facility]AP[facility] 1997: BREAST EXCISIONAL BIOPSY; Right     Comment:  pos 12/25/2021: BREAST LUMPECTOMY; Right     Comment:  breast cancer 1997: BREAST SURGERY; Right     Comment:  lumpectomy 08/06/2016: CHOLECYSTECTOMY; N/A     Comment:  Procedure: LAPAROSCOPIC CHOLECYSTECTOMY WITH               INTRAOPERATIVE CHOLANGIOGRAM;  Surgeon: Kieth Brightly, MD;  Location: [facility] ORS;  Service:               General;  Laterality: N/A; 2008: COLONOSCOPY 07/21/2015: COLONOSCOPY WITH PROPOFOL; N/A     Comment:  Procedure: COLONOSCOPY WITH PROPOFOL;  Surgeon: Midge Minium, MD;  Location: [facility];  Service:               Endoscopy;  Laterality: N/A; 01/05/2018: COLONOSCOPY WITH PROPOFOL; N/A     Comment:  Procedure: COLONOSCOPY WITH PROPOFOL;  Surgeon: Wyline Mood, MD;  Location: [facility] ENDOSCOPY;  Service:  Gastroenterology;  Laterality: N/A; 06/16/2016: ESOPHAGOGASTRODUODENOSCOPY (EGD) WITH PROPOFOL; N/A     Comment:  Procedure: ESOPHAGOGASTRODUODENOSCOPY (EGD) WITH               PROPOFOL;  Surgeon: Kieth Brightly, MD;                Location: [facility] ENDOSCOPY;  Service: Endoscopy;                Laterality: N/A; 01/05/2018: ESOPHAGOGASTRODUODENOSCOPY (EGD) WITH PROPOFOL; N/A     Comment:  Procedure: ESOPHAGOGASTRODUODENOSCOPY (EGD) WITH               PROPOFOL;  Surgeon: Wyline Mood, MD;  Location: [facility]               ENDOSCOPY;  Service: Gastroenterology;  Laterality: N/A; 06/13/2018: ESOPHAGOGASTRODUODENOSCOPY (EGD) WITH PROPOFOL; N/A     Comment:  Procedure: ESOPHAGOGASTRODUODENOSCOPY (EGD) WITH               PROPOFOL;  Surgeon: Wyline Mood, MD;  Location: [facility]               ENDOSCOPY;  Service: Gastroenterology;  Laterality: N/A; 07/16/2018: ESOPHAGOGASTRODUODENOSCOPY (EGD) WITH PROPOFOL; N/A     Comment:   Procedure: ESOPHAGOGASTRODUODENOSCOPY (EGD) WITH               PROPOFOL;  Surgeon: Midge Minium, MD;  Location: [facility]               ENDOSCOPY;  Service: Endoscopy;  Laterality: N/A; 12/27/2016: HARDWARE REMOVAL; Left     Comment:  Procedure: HARDWARE REMOVAL LEFT  PROXIMAL HUMEROUS;                Surgeon: Signa Kell, MD;  Location: [facility] ORS;  Service:              Orthopedics;  Laterality: Left; 04/21/2018: IR GJ TUBE CHANGE 03/09/2018: NISSEN FUNDOPLICATION; N/A     Comment:  Procedure: NISSEN FUNDOPLICATION;  Surgeon: Leafy Ro, MD;  Location: [facility] ORS;  Service: General;                Laterality: N/A; 11/23/2016: ORIF HUMERUS FRACTURE; Left     Comment:  Procedure: OPEN REDUCTION INTERNAL FIXATION (ORIF)               PROXIMAL HUMERUS FRACTURE;  Surgeon: Signa Kell, MD;                Location: [facility] ORS;  Service: Orthopedics;  Laterality:               Left; 12/25/2021: PART MASTECTOMY,RADIO FREQUENCY LOCALIZER,AXILLARY  SENTINEL NODE BIOPSY; Right     Comment:  Procedure: PART MASTECTOMY,RADIO FREQUENCY               LOCALIZER,AXILLARY SENTINEL NODE BIOPSY;  Surgeon: [name], DO;  Location: [facility] ORS;  Service: General;                Laterality: Right; 03/09/2018: REPAIR OF ESOPHAGUS     Comment:  Procedure: REPAIR OF ESOPHAGUS;  Surgeon: Leafy Ro, MD;  Location: [facility] ORS;  Service: General;; 12/27/2016: REVERSE SHOULDER ARTHROPLASTY; Left     Comment:  Procedure: REVERSE SHOULDER ARTHROPLASTY;  Surgeon:  Signa Kell, MD;  Location: [facility] ORS;  Service:               Orthopedics;  Laterality: Left; 03/09/2018: ROBOTIC ASSISTED LAPAROSCOPIC REPAIR OF PARAESOPHAGEAL  HERNIA; N/A     Comment:  Procedure: ROBOTIC HIATAL HERNIA CONVERTED TO OPEN;                Surgeon: Leafy Ro, MD;  Location: [facility] ORS;                Service: General;  Laterality: N/A;  BMI    Body Mass Index: 34.61 kg/m       Reproductive/Obstetrics negative OB ROS                             Anesthesia Physical Anesthesia Plan  ASA: 3  Anesthesia Plan: General   Post-op Pain Management:    Induction: Intravenous  PONV Risk Score and Plan: Propofol infusion and TIVA  Airway Management Planned: Natural Airway and Nasal Cannula  Additional Equipment:   Intra-op Plan:   Post-operative Plan:   Informed Consent: I have reviewed the patients History and Physical, chart, labs and discussed the procedure including the risks, benefits and alternatives for the proposed anesthesia with the patient or authorized representative who has indicated his/her understanding and acceptance.     Dental Advisory Given  Plan Discussed with: Anesthesiologist, CRNA and Surgeon  Anesthesia Plan Comments: (Patient consented for risks of anesthesia including but not limited to:  - adverse reactions to medications - risk of airway placement if required - damage to eyes, teeth, lips or other oral mucosa - nerve damage due to positioning  - sore throat or hoarseness - Damage to heart, brain, nerves, lungs, other parts of body or loss of life  Patient voiced understanding and assent.)       Anesthesia Quick Evaluation

## 2023-03-23 NOTE — Transfer of Care (Signed)
 Immediate Anesthesia Transfer of Care Note  Patient: Chelsey [name]  Procedure(s) Performed: COLONOSCOPY WITH PROPOFOL POLYPECTOMY  Patient Location: PACU  Anesthesia Type:General  Level of Consciousness: sedated  Airway & Oxygen Therapy: Patient Spontanous Breathing  Post-op Assessment: Report given to RN  Post vital signs: Reviewed and stable  Last Vitals:  Vitals Value Taken Time  BP    Temp    Pulse    Resp    SpO2      Last Pain:  Vitals:   03/23/23 1225  TempSrc: Temporal  PainSc: 0-No pain         Complications: There were no known notable events for this encounter.

## 2023-03-23 NOTE — Op Note (Signed)
 [facility] Gastroenterology Patient Name: Chelsey [name] Procedure Date: 03/23/2023 12:42 PM MRN: [id number] Account #: [id number] Date of Birth: August 07, 1950 Admit Type: Outpatient Age: 73 Room: [facility] ENDO ROOM 3 Gender: Female Note Status: Finalized Instrument Name: Nelda Marseille [id number] Procedure:             Colonoscopy Indications:           Surveillance: Personal history of adenomatous polyps                         on last colonoscopy > 3 years ago, Last colonoscopy:                         December 2019 Providers:             Wyline Mood MD, MD Referring MD:          Wyline Mood MD, MD (Referring MD), Dorcas Carrow                         (Referring MD) Medicines:             Monitored Anesthesia Care Complications:         No immediate complications. Procedure:             Pre-Anesthesia Assessment:                        - Prior to the procedure, a History and Physical was                         performed, and patient medications, allergies and                         sensitivities were reviewed. The patient's tolerance                         of previous anesthesia was reviewed.                        - The risks and benefits of the procedure and the                         sedation options and risks were discussed with the                         patient. All questions were answered and informed                         consent was obtained.                        - ASA Grade Assessment: II - A patient with mild                         systemic disease.                        After obtaining informed consent, the colonoscope was                         passed under direct vision.  Throughout the procedure,                         the patient's blood pressure, pulse, and oxygen                         saturations were monitored continuously. The                         Colonoscope was introduced through the anus and                         advanced to the  the cecum, identified by the                         appendiceal orifice. The colonoscopy was performed                         with ease. The patient tolerated the procedure well.                         The quality of the bowel preparation was adequate. The                         ileocecal valve, appendiceal orifice, and rectum were                         photographed. Findings:      The perianal and digital rectal examinations were normal.      Four sessile polyps were found in the descending colon. The polyps were       4 to 6 mm in size. These polyps were removed with a cold snare.       Resection and retrieval were complete.      Two sessile polyps were found in the descending colon. The polyps were 3       to 4 mm in size. These polyps were removed with a jumbo cold forceps.       Resection and retrieval were complete.      Multiple medium-mouthed diverticula were found in the sigmoid colon.      Non-bleeding internal hemorrhoids were found during retroflexion. The       hemorrhoids were medium-sized and Grade I (internal hemorrhoids that do       not prolapse).      The exam was otherwise without abnormality on direct and retroflexion       views. Impression:            - Four 4 to 6 mm polyps in the descending colon,                         removed with a cold snare. Resected and retrieved.                        - Two 3 to 4 mm polyps in the descending colon,                         removed with a jumbo cold forceps. Resected and  retrieved.                        - Diverticulosis in the sigmoid colon.                        - Non-bleeding internal hemorrhoids.                        - The examination was otherwise normal on direct and                         retroflexion views. Recommendation:        - Discharge patient to home (with escort).                        - Resume previous diet.                        - Continue present medications.                         - Await pathology results.                        - Repeat colonoscopy for surveillance based on                         pathology results. Procedure Code(s):     --- Professional ---                        [phone], Colonoscopy, flexible; with removal of                         tumor(s), polyp(s), or other lesion(s) by snare                         technique                        45380, 59, Colonoscopy, flexible; with biopsy, single                         or multiple Diagnosis Code(s):     --- Professional ---                        D12.4, Benign neoplasm of descending colon                        Z86.010, Personal history of colonic polyps                        K64.0, First degree hemorrhoids                        K57.30, Diverticulosis of large intestine without                         perforation or abscess without bleeding CPT copyright 2022 American [employer] Association. All rights reserved. The codes documented in this report are preliminary and upon coder review may  be revised to meet current compliance requirements.  Wyline Mood, MD Wyline Mood MD, MD 03/23/2023 1:11:29 PM This report has been signed electronically. Number of Addenda: 0 Note Initiated On: 03/23/2023 12:42 PM Scope Withdrawal Time: 0 hours 11 minutes 6 seconds  Total Procedure Duration: 0 hours 14 minutes 15 seconds  Estimated Blood Loss:  Estimated blood loss: none.      [facility]

## 2023-03-24 ENCOUNTER — Encounter: Payer: Self-pay | Admitting: Gastroenterology

## 2023-03-24 ENCOUNTER — Ambulatory Visit: Payer: Medicare Other | Attending: Nurse Practitioner | Admitting: Nurse Practitioner

## 2023-03-24 VITALS — BP 78/56 | HR 93 | Ht 65.0 in | Wt 212.0 lb

## 2023-03-24 DIAGNOSIS — R0609 Other forms of dyspnea: Secondary | ICD-10-CM

## 2023-03-24 DIAGNOSIS — Z720 Tobacco use: Secondary | ICD-10-CM

## 2023-03-24 DIAGNOSIS — I700 Atherosclerosis of aorta: Secondary | ICD-10-CM

## 2023-03-24 DIAGNOSIS — E785 Hyperlipidemia, unspecified: Secondary | ICD-10-CM

## 2023-03-24 LAB — SURGICAL PATHOLOGY

## 2023-03-24 NOTE — Patient Instructions (Signed)
 Medication Instructions:  Your physician recommends that you continue on your current medications as directed. Please refer to the Current Medication list given to you today.  *If you need a refill on your cardiac medications before your next appointment, please call your pharmacy*   Testing/Procedures: Your physician has requested that you have an echocardiogram. Echocardiography is a painless test that uses sound waves to create images of your heart. It provides your doctor with information about the size and shape of your heart and how well your heart's chambers and valves are working.   You may receive an ultrasound enhancing agent through an IV if needed to better visualize your heart during the echo. This procedure takes approximately one hour.  There are no restrictions for this procedure.  This will take place at [address] [facility] [address] [facility], [place] [address]  Please note: We ask at that you not bring children with you during ultrasound (echo/ vascular) testing. Due to room size and safety concerns, children are not allowed in the ultrasound rooms during exams. Our front office staff cannot provide observation of children in our lobby area while testing is being conducted. An adult accompanying a patient to their appointment will only be allowed in the ultrasound room at the discretion of the ultrasound technician under special circumstances. We apologize for any inconvenience.    Follow-Up: At [facility], you and your health needs are our priority.  As part of our continuing mission to provide you with exceptional heart care, we have created designated Provider Care Teams.  These Care Teams include your primary Cardiologist (physician) and Advanced Practice Providers (APPs -  Physician Assistants and Nurse Practitioners) who all work together to provide you with the care you need, when you need it.  We recommend signing up for the patient portal called  "MyChart".  Sign up information is provided on this After Visit Summary.  MyChart is used to connect with patients for Virtual Visits (Telemedicine).  Patients are able to view lab/test results, encounter notes, upcoming appointments, etc.  Non-urgent messages can be sent to your provider as well.   To learn more about what you can do with MyChart, go to [email or web address].    Your next appointment:   4-6 week(s)  Provider:   Debbe Odea, MD, Lorine Bears, MD, Yvonne Kendall, MD, or Nicolasa Ducking, NP

## 2023-03-24 NOTE — Anesthesia Postprocedure Evaluation (Signed)
 Anesthesia Post Note  Patient: Chelsey [name]  Procedure(s) Performed: COLONOSCOPY WITH PROPOFOL POLYPECTOMY  Patient location during evaluation: Endoscopy Anesthesia Type: General Level of consciousness: awake and alert Pain management: pain level controlled Vital Signs Assessment: post-procedure vital signs reviewed and stable Respiratory status: spontaneous breathing, nonlabored ventilation, respiratory function stable and patient connected to nasal cannula oxygen Cardiovascular status: blood pressure returned to baseline and stable Postop Assessment: no apparent nausea or vomiting Anesthetic complications: no   There were no known notable events for this encounter.   Last Vitals:  Vitals:   03/23/23 1321 03/23/23 1331  BP: 114/82 101/75  Pulse: 87 76  Resp: (!) 87 (!) 68  Temp:    SpO2: 96% 100%    Last Pain:  Vitals:   03/23/23 1331  TempSrc:   PainSc: 0-No pain                 Cleda Mccreedy Cedar Roseman

## 2023-03-24 NOTE — Progress Notes (Addendum)
 Cardiology Clinic Note   Patient Name: Chelsey [name] Date of Encounter: 03/24/2023  Primary Care Provider:  Dorcas Carrow, DO Primary Cardiologist:  None - new  Patient Profile    73 y.o. female with a history of breast cancer, hyperlipidemia, GERD, hiatal hernia, and panic attacks, who presents to establish cardiology care in the setting of ongoing dyspnea.  Past Medical History    Past Medical History:  Diagnosis Date   Aortic atherosclerosis (HCC)    a. Noted on CT abd 08/2019.   Breast cancer [facility]) 1997   right breast, radiation   Bronchitis    recent/ 06/09/15 had chest xray/ [name]/resolved   Cancer [facility]) 1997   right lumpectomy,L/Ad/R    GERD (gastroesophageal reflux disease)    History of hiatal hernia    Hyperlipidemia    Low BP    a. typically 80's/60's   Osteopenia    Panic attack    Paraesophageal hernia    Personal history of malignant neoplasm of breast    Personal history of radiation therapy    Past Surgical History:  Procedure Laterality Date   BICEPT TENODESIS Left 11/23/2016   Procedure: BICEPS TENODESIS;  Surgeon: Signa Kell, MD;  Location: [facility];  Service: Orthopedics;  Laterality: Left;   BREAST BIOPSY Right 12/21/2021   MM RT RADIO FREQUENCY TAG LOC MAMMO GUIDE 12/21/2021 [facility]   BREAST EXCISIONAL BIOPSY Right 1997   pos   BREAST LUMPECTOMY Right 12/25/2021   breast cancer   BREAST SURGERY Right 1997   lumpectomy   CHOLECYSTECTOMY N/A 08/06/2016   Procedure: LAPAROSCOPIC CHOLECYSTECTOMY WITH INTRAOPERATIVE CHOLANGIOGRAM;  Surgeon: Kieth Brightly, MD;  Location: [facility];  Service: General;  Laterality: N/A;   COLONOSCOPY  2008   COLONOSCOPY WITH PROPOFOL N/A 07/21/2015   Procedure: COLONOSCOPY WITH PROPOFOL;  Surgeon: Midge Minium, MD;  Location: [facility];  Service: Endoscopy;  Laterality: N/A;   COLONOSCOPY WITH PROPOFOL N/A 01/05/2018   Procedure: COLONOSCOPY WITH PROPOFOL;  Surgeon:  Wyline Mood, MD;  Location: [facility];  Service: Gastroenterology;  Laterality: N/A;   COLONOSCOPY WITH PROPOFOL N/A 03/23/2023   Procedure: COLONOSCOPY WITH PROPOFOL;  Surgeon: Wyline Mood, MD;  Location: [facility] ENDOSCOPY;  Service: Gastroenterology;  Laterality: N/A;   ESOPHAGOGASTRODUODENOSCOPY (EGD) WITH PROPOFOL N/A 06/16/2016   Procedure: ESOPHAGOGASTRODUODENOSCOPY (EGD) WITH PROPOFOL;  Surgeon: Kieth Brightly, MD;  Location: [facility];  Service: Endoscopy;  Laterality: N/A;   ESOPHAGOGASTRODUODENOSCOPY (EGD) WITH PROPOFOL N/A 01/05/2018   Procedure: ESOPHAGOGASTRODUODENOSCOPY (EGD) WITH PROPOFOL;  Surgeon: Wyline Mood, MD;  Location: [facility] ENDOSCOPY;  Service: Gastroenterology;  Laterality: N/A;   ESOPHAGOGASTRODUODENOSCOPY (EGD) WITH PROPOFOL N/A 06/13/2018   Procedure: ESOPHAGOGASTRODUODENOSCOPY (EGD) WITH PROPOFOL;  Surgeon: Wyline Mood, MD;  Location: [facility] ENDOSCOPY;  Service: Gastroenterology;  Laterality: N/A;   ESOPHAGOGASTRODUODENOSCOPY (EGD) WITH PROPOFOL N/A 07/16/2018   Procedure: ESOPHAGOGASTRODUODENOSCOPY (EGD) WITH PROPOFOL;  Surgeon: Midge Minium, MD;  Location: [facility];  Service: Endoscopy;  Laterality: N/A;   HARDWARE REMOVAL Left 12/27/2016   Procedure: HARDWARE REMOVAL LEFT  PROXIMAL HUMEROUS;  Surgeon: Signa Kell, MD;  Location: [facility];  Service: Orthopedics;  Laterality: Left;   IR GJ TUBE CHANGE  04/21/2018   NISSEN FUNDOPLICATION N/A 03/09/2018   Procedure: NISSEN FUNDOPLICATION;  Surgeon: Leafy Ro, MD;  Location: [facility];  Service: General;  Laterality: N/A;   ORIF HUMERUS FRACTURE Left 11/23/2016   Procedure: OPEN REDUCTION INTERNAL FIXATION (ORIF) PROXIMAL HUMERUS FRACTURE;  Surgeon: Signa Kell, MD;  Location: [facility];  Service: Orthopedics;  Laterality: Left;   PART MASTECTOMY,RADIO FREQUENCY LOCALIZER,AXILLARY SENTINEL NODE BIOPSY Right 12/25/2021   Procedure: PART MASTECTOMY,RADIO FREQUENCY LOCALIZER,AXILLARY SENTINEL NODE  BIOPSY;  Surgeon: [name], DO;  Location: [facility];  Service: General;  Laterality: Right;   POLYPECTOMY  03/23/2023   Procedure: POLYPECTOMY;  Surgeon: Wyline Mood, MD;  Location: [facility] ENDOSCOPY;  Service: Gastroenterology;;   REPAIR OF ESOPHAGUS  03/09/2018   Procedure: REPAIR OF ESOPHAGUS;  Surgeon: Leafy Ro, MD;  Location: [facility];  Service: General;;   REVERSE SHOULDER ARTHROPLASTY Left 12/27/2016   Procedure: REVERSE SHOULDER ARTHROPLASTY;  Surgeon: Signa Kell, MD;  Location: [facility];  Service: Orthopedics;  Laterality: Left;   ROBOTIC ASSISTED LAPAROSCOPIC REPAIR OF PARAESOPHAGEAL HERNIA N/A 03/09/2018   Procedure: ROBOTIC HIATAL HERNIA CONVERTED TO OPEN;  Surgeon: Leafy Ro, MD;  Location: [facility];  Service: General;  Laterality: N/A;    Allergies  No Known Allergies  History of Present Illness      73 y.o. y/o female history of breast cancer, hyperlipidemia, GERD, hiatal hernia, and panic attacks.  She was diagnosed with right breast cancer in 1997 and subsequently underwent lumpectomy and radiation followed by lumpectomy in December 2023 with repeat radiation and initiation of letrozole therapy.  She has no prior cardiac history, though aortic atherosclerosis was noted on CT of the abdomen and pelvis in August 2021.  She lives locally w/ her son.  She does not routinely exercise and continues to smoke 4 cigarettes per day.    Over the past year, she has noted a progressive decline in activity tolerance w/ worsening dyspnea and fatigue.  She sometimes notes a brief sensation of discomfort across the top of her chest, usually occurring at rest, lasting just a few seconds, resolving spontaneously.  She does not think that this happens with activity.  Earlier this year, she was placed on Breztri by her primary care provider with only slight improvement in dyspnea.  An ECG in February showed sinus rhythm without acute ST or T changes.  In the setting ongoing dyspnea, she  was referred to cardiology today.  She denies palpitations, PND, orthopnea, dizziness, syncope, edema, or early satiety.     Objective  Home Medications    Current Outpatient Medications  Medication Sig Dispense Refill   atorvastatin (LIPITOR) 20 MG tablet Take 1 tablet (20 mg total) by mouth daily. 90 tablet 1   buPROPion (WELLBUTRIN XL) 300 MG 24 hr tablet One tab Once a day 90 tablet 1   busPIRone (BUSPAR) 10 MG tablet Take 1 tablet (10 mg total) by mouth 2 (two) times daily. 180 tablet 1   letrozole (FEMARA) 2.5 MG tablet Take 1 tablet (2.5 mg total) by mouth daily. 90 tablet 3   pantoprazole (PROTONIX) 40 MG tablet Take 1 tablet (40 mg total) by mouth daily. 90 tablet 3   trazodone (DESYREL) 300 MG tablet TAKE 1 TABLET(300 MG) BY MOUTH AT BEDTIME 90 tablet 1   Vitamin D, Ergocalciferol, (DRISDOL) 1.25 MG (50000 UNIT) CAPS capsule Take 1 capsule (50,000 Units total) by mouth every 7 (seven) days. 12 capsule 1   No current facility-administered medications for this visit.   Facility-Administered Medications Ordered in Other Visits  Medication Dose Route Frequency Provider Last Rate Last Admin   lidocaine (PF) (XYLOCAINE) 1 % injection 10 mL  10 mL Other Once [name], DO         Family History    Family History  Problem Relation Age of Onset   Kidney disease Mother    Cancer Father        stomach   Heart attack Father    Hypertension Father    Alcohol abuse Father    Anxiety disorder Brother    Obesity Brother    COPD Brother    Depression Brother    Anxiety disorder Brother    Cancer Maternal Grandmother    Breast cancer Maternal Grandmother    She indicated that her mother is deceased. She indicated that her father is deceased. She indicated that her sister is alive. She indicated that only one of her two brothers is alive. She indicated that her maternal grandmother is deceased. She indicated that her maternal grandfather is deceased. She indicated that her  paternal grandmother is deceased. She indicated that her paternal grandfather is deceased. She indicated that both of her sons are alive.   Social History    Social History   Socioeconomic History   Marital status: Widowed    Spouse name: Not on file   Number of children: 2   Years of education: Not on file   Highest education level: Bachelor's degree (e.g., BA, AB, BS)  Occupational History   Occupation: retired  Tobacco Use   Smoking status: Every Day    Current packs/day: 0.15    Average packs/day: 0.2 packs/day for 13.3 years (2.0 ttl pk-yrs)    Types: Cigarettes    Start date: 11/25/2009   Smokeless tobacco: Never   Tobacco comments:    3 cigarette daily  Vaping Use   Vaping status: Never Used  Substance and Sexual Activity   Alcohol use: No    Alcohol/week: 0.0 standard drinks of alcohol   Drug use: No   Sexual activity: Never  Other Topics Concern   Not on file  Social History Narrative   Lives with son.  Does not routinely exercise.   Social Drivers of Corporate investment banker Strain: Low Risk  (01/06/2023)   Overall Financial Resource Strain (CARDIA)    Difficulty of Paying Living Expenses: Not very hard  Food Insecurity: No Food Insecurity (01/06/2023)   Hunger Vital Sign    Worried About Running Out of Food in the Last Year: Never true    Ran Out of Food in the Last Year: Never true  Transportation Needs: No Transportation Needs (01/06/2023)   PRAPARE - Administrator, Civil Service (Medical): No    Lack of Transportation (Non-Medical): No  Physical Activity: Inactive (01/06/2023)   Exercise Vital Sign    Days of Exercise per Week: 0 days    Minutes of Exercise per Session: 0 min  Stress: No Stress Concern Present (01/06/2023)   [employer] of Occupational Health - Occupational Stress Questionnaire    Feeling of Stress : Not at all  Social Connections: Socially Isolated (01/06/2023)   Social Connection and Isolation Panel [NHANES]     Frequency of Communication with Friends and Family: Three times a week    Frequency of Social Gatherings with Friends and Family: Once a week    Attends Religious Services: Never    Database administrator or Organizations: No    Attends Banker Meetings: Never    Marital Status: Widowed  Intimate Partner Violence: Not At Risk (01/06/2023)   Humiliation, Afraid, Rape, and Kick questionnaire    Fear of Current or Ex-Partner: No    Emotionally Abused: No    Physically Abused: No  Sexually Abused: No     Review of Systems    General:  Generalized malaise and easy fatigability.  No chills, fever, night sweats or weight changes.  Cardiovascular:  rare fleeting chest pain, +++ dyspnea on exertion, no edema, orthopnea, palpitations, paroxysmal nocturnal dyspnea. Dermatological: No rash, lesions/masses Respiratory: No cough, +++ dyspnea Urologic: No hematuria, dysuria Abdominal:   No nausea, vomiting, diarrhea, bright red blood per rectum, melena, or hematemesis Neurologic:  No visual changes, wkns, changes in mental status. All other systems reviewed and are otherwise negative except as noted above.     Physical Exam    VS:  BP (!) 78/56 (BP Location: Left Arm, Patient Position: Sitting, Cuff Size: Normal)   Pulse 93   Ht 5\' 5"  (1.651 m)   Wt 212 lb (96.2 kg)   LMP  (LMP Unknown)   SpO2 96%   BMI 35.28 kg/m  , BMI Body mass index is 35.28 kg/m.     GEN: Well nourished, well developed, in no acute distress. HEENT: normal. Neck: Supple, no JVD, carotid bruits, or masses. Cardiac: RRR, no murmurs, rubs, or gallops. No clubbing, cyanosis, edema.  Radials 2+/PT 2+ and equal bilaterally.  Respiratory:  Respirations regular and unlabored, clear to auscultation bilaterally. GI: Soft, nontender, nondistended, BS + x 4. MS: no deformity or atrophy. Skin: warm and dry, no rash. Neuro:  Strength and sensation are intact. Psych: Normal affect.  Accessory Clinical  Findings    ECG personally reviewed by me today- EKG Interpretation Date/Time:  Thursday March 24 2023 15:00:23 EST Ventricular Rate:  93 PR Interval:  154 QRS Duration:  74 QT Interval:  370 QTC Calculation: 460 R Axis:   -1  Text Interpretation: Normal sinus rhythm Normal ECG Confirmed by Nicolasa Ducking [phone]) on 03/24/2023 3:18:43 PM  - No acute changes  Lab Results  Component Value Date   WBC 5.4 01/28/2023   HGB 13.4 01/28/2023   HCT 41.0 01/28/2023   MCV 92 01/28/2023   PLT 229 01/28/2023   Lab Results  Component Value Date   CREATININE 0.98 01/28/2023   BUN 13 01/28/2023   NA 138 01/28/2023   K 4.2 01/28/2023   CL 102 01/28/2023   CO2 21 01/28/2023   Lab Results  Component Value Date   ALT 13 01/28/2023   AST 13 01/28/2023   ALKPHOS 143 (H) 01/28/2023   BILITOT 0.5 01/28/2023   Lab Results  Component Value Date   CHOL 170 01/28/2023   HDL 46 01/28/2023   LDLCALC 101 (H) 01/28/2023   TRIG 129 01/28/2023   CHOLHDL 3.2 10/02/2019       Assessment & Plan   1.  Dyspnea on exertion: Patient with a 1 year history of progressive dyspnea on exertion and fatigue.  Dyspnea now occurring with minimal activity.  She rarely experiences a fleeting discomfort across her chest, typically at rest and not associated with dyspnea.  She denies symptoms of heart failure and is euvolemic on examination.  I will arrange for 2D echocardiogram to evaluate LV function and rule out valvular and structural abnormalities.  With history of ongoing tobacco abuse, hyperlipidemia, and aortic atherosclerosis, and ischemic evaluation is likely warranted though will await LV function to determine most appropriate path forward.  2.  Aortic atherosclerosis/hyperlipidemia: On atorvastatin with an LDL of 101 earlier this year.  She is now on atorvastatin.  3.  Ongoing tobacco abuse: Smoking 4 cigarettes/day.  We discussed the importance of smoking cessation.  She says  that she would like to  quit.  She previously quit for about 10 years in her 84s, using nicotine patches at that time  She thinks that because she smokes a little at this point, that she prefers to just quit cold [place] at this time.  4.  Hypotension:  BP was initially low today but improved on retake.  She has had variable BP recordings in the past.  Asymptomatic.  5.  Dispo:  f/u echo.  Further recs pending echo.  F/u in 1 month or sooner if necessary.  Nicolasa Ducking, NP 03/24/2023, 4:09 PM

## 2023-03-31 ENCOUNTER — Other Ambulatory Visit: Payer: Self-pay | Admitting: Oncology

## 2023-04-01 ENCOUNTER — Other Ambulatory Visit: Payer: Self-pay | Admitting: Nurse Practitioner

## 2023-04-01 DIAGNOSIS — E785 Hyperlipidemia, unspecified: Secondary | ICD-10-CM

## 2023-04-01 DIAGNOSIS — R0609 Other forms of dyspnea: Secondary | ICD-10-CM

## 2023-04-01 DIAGNOSIS — I700 Atherosclerosis of aorta: Secondary | ICD-10-CM

## 2023-04-01 DIAGNOSIS — Z720 Tobacco use: Secondary | ICD-10-CM

## 2023-04-15 ENCOUNTER — Other Ambulatory Visit: Payer: Self-pay | Admitting: Nurse Practitioner

## 2023-04-15 DIAGNOSIS — R0609 Other forms of dyspnea: Secondary | ICD-10-CM

## 2023-04-15 DIAGNOSIS — E785 Hyperlipidemia, unspecified: Secondary | ICD-10-CM

## 2023-04-15 DIAGNOSIS — I700 Atherosclerosis of aorta: Secondary | ICD-10-CM

## 2023-04-15 DIAGNOSIS — Z720 Tobacco use: Secondary | ICD-10-CM

## 2023-04-19 ENCOUNTER — Ambulatory Visit: Payer: Medicare Other | Attending: Nurse Practitioner

## 2023-04-19 DIAGNOSIS — R0609 Other forms of dyspnea: Secondary | ICD-10-CM

## 2023-04-19 LAB — ECHOCARDIOGRAM COMPLETE
AR max vel: 2.11 cm2
AV Area VTI: 2.16 cm2
AV Area mean vel: 2.03 cm2
AV Mean grad: 5 mmHg
AV Peak grad: 10.1 mmHg
Ao pk vel: 1.59 m/s
Area-P 1/2: 3.21 cm2
Calc EF: 54.6 %
Single Plane A2C EF: 55.7 %
Single Plane A4C EF: 52.1 %

## 2023-04-20 ENCOUNTER — Telehealth: Payer: Self-pay | Admitting: *Deleted

## 2023-04-20 DIAGNOSIS — R0609 Other forms of dyspnea: Secondary | ICD-10-CM

## 2023-04-20 NOTE — Telephone Encounter (Signed)
-----   Message from Chelsey [name] sent at 04/20/2023  7:41 AM EDT ----- Normal heart squeezing function with mild stiffness of the left ventricle.  The mitral valve is moderately leaky, which at this time isn't likely contributing to shortness of breath but we will plan to follow with a repeat echo in a year or so.  As we discussed in clinic, with normal heart squeeze, we should now look to rule out the potential of heart blockage causing her progressive shortness of breath.  I recommend that we proceed with a lexiscan myoview, which can likely be performed before her planned follow-up with Dr. Azucena Cecil in April.

## 2023-04-20 NOTE — Telephone Encounter (Signed)
 Left a message for the patient to call back.

## 2023-04-21 NOTE — Telephone Encounter (Signed)
 Left a message for the patient to call back.

## 2023-04-22 NOTE — Telephone Encounter (Signed)
 Patient made aware of results and verbalized understanding.  Order has been placed.  Your provider has ordered a Lexiscan Myoview Stress test. This will take place at [facility]. Please report to the [facility] medical mall entrance. The volunteers at the first desk will direct you where to go.  [facility] MYOVIEW  Your provider has ordered a Stress Test with nuclear imaging. The purpose of this test is to evaluate the blood supply to your heart muscle. This procedure is referred to as a "Non-Invasive Stress Test." This is because other than having an IV started in your vein, nothing is inserted or "invades" your body. Cardiac stress tests are done to find areas of poor blood flow to the heart by determining the extent of coronary artery disease (CAD). Some patients exercise on a treadmill, which naturally increases the blood flow to your heart, while others who are unable to walk on a treadmill due to physical limitations will have a pharmacologic/chemical stress agent called Lexiscan . This medicine will mimic walking on a treadmill by temporarily increasing your coronary blood flow.   Please note: these test may take anywhere between 2-4 hours to complete  How to prepare for your Myoview test:  Nothing to eat for 6 hours prior to the test No caffeine for 24 hours prior to test No smoking 24 hours prior to test. Your medication may be taken with water.  Ladies, please do not wear dresses.  Skirts or pants are appropriate. Please wear a short sleeve shirt. No perfume, cologne or lotion. Wear comfortable walking shoes. No heels!   PLEASE NOTIFY THE OFFICE AT LEAST 24 HOURS IN ADVANCE IF YOU ARE UNABLE TO KEEP YOUR APPOINTMENT.  [phone] AND  PLEASE NOTIFY NUCLEAR MEDICINE AT [facility] AT LEAST 24 HOURS IN ADVANCE IF YOU ARE UNABLE TO KEEP YOUR APPOINTMENT. [phone]

## 2023-04-28 ENCOUNTER — Encounter: Payer: Self-pay | Admitting: Oncology

## 2023-04-28 ENCOUNTER — Ambulatory Visit (INDEPENDENT_AMBULATORY_CARE_PROVIDER_SITE_OTHER): Admitting: Family Medicine

## 2023-04-28 ENCOUNTER — Encounter: Payer: Self-pay | Admitting: Family Medicine

## 2023-04-28 ENCOUNTER — Ambulatory Visit: Payer: Medicare Other | Admitting: Family Medicine

## 2023-04-28 ENCOUNTER — Inpatient Hospital Stay: Payer: Medicare Other | Attending: Oncology | Admitting: Oncology

## 2023-04-28 VITALS — BP 135/85 | HR 78 | Temp 98.4°F | Resp 18 | Ht 66.0 in | Wt 215.0 lb

## 2023-04-28 VITALS — BP 124/84 | HR 80 | Temp 97.9°F | Ht 66.0 in | Wt 215.5 lb

## 2023-04-28 DIAGNOSIS — C50911 Malignant neoplasm of unspecified site of right female breast: Secondary | ICD-10-CM | POA: Diagnosis not present

## 2023-04-28 DIAGNOSIS — Z79811 Long term (current) use of aromatase inhibitors: Secondary | ICD-10-CM | POA: Insufficient documentation

## 2023-04-28 DIAGNOSIS — R92323 Mammographic fibroglandular density, bilateral breasts: Secondary | ICD-10-CM | POA: Insufficient documentation

## 2023-04-28 DIAGNOSIS — C50919 Malignant neoplasm of unspecified site of unspecified female breast: Secondary | ICD-10-CM | POA: Diagnosis not present

## 2023-04-28 DIAGNOSIS — Z1721 Progesterone receptor positive status: Secondary | ICD-10-CM | POA: Diagnosis not present

## 2023-04-28 DIAGNOSIS — M8580 Other specified disorders of bone density and structure, unspecified site: Secondary | ICD-10-CM | POA: Insufficient documentation

## 2023-04-28 DIAGNOSIS — Z1732 Human epidermal growth factor receptor 2 negative status: Secondary | ICD-10-CM | POA: Diagnosis not present

## 2023-04-28 DIAGNOSIS — Z170 Estrogen receptor positive status [ER+]: Secondary | ICD-10-CM | POA: Insufficient documentation

## 2023-04-28 DIAGNOSIS — Z923 Personal history of irradiation: Secondary | ICD-10-CM | POA: Diagnosis not present

## 2023-04-28 DIAGNOSIS — R0609 Other forms of dyspnea: Secondary | ICD-10-CM

## 2023-04-28 MED ORDER — VITAMIN D (ERGOCALCIFEROL) 1.25 MG (50000 UNIT) PO CAPS: 50000.0000 [IU] | ORAL_CAPSULE | ORAL | 0 refills | Status: DC

## 2023-04-28 MED ORDER — TRAZODONE HCL 300 MG PO TABS: ORAL_TABLET | ORAL | 0 refills | Status: DC

## 2023-04-28 NOTE — Progress Notes (Signed)
 [facility]  [phone]) [phone] [phone]  ID: Chelsey [name] OB: [birth year]  MR#: [id number]  JOA#:[id number]  Patient Care Team: Dorcas Carrow, DO as PCP - General (Family Medicine) Hulen Luster, RN as Oncology Nurse Navigator Carmina Miller, MD as Consulting Physician (Radiation Oncology) Jeralyn Ruths, MD as Consulting Physician (Oncology) [name], DO as Consulting Physician (General Surgery) Wyline Mood, MD as Consulting Physician (Gastroenterology)  CHIEF COMPLAINT: Pathologic stage Ia ER/PR positive, HER2 negative invasive carcinoma of the right breast.  Oncotype Dx 8.  INTERVAL HISTORY: Patient returns to clinic today for routine 2-month evaluation.  She continues to tolerate letrozole well without significant side effects.  She currently feels well and is asymptomatic.  She has no neurologic complaints.  She denies any recent fevers or illnesses.  She has a good appetite and denies weight loss.  She has no chest pain, shortness of breath, cough, or hemoptysis.  She denies any nausea, vomiting, constipation, or diarrhea.  She has no urinary complaints.  Patient offers no specific complaints today.  REVIEW OF SYSTEMS:   Review of Systems  Constitutional: Negative.  Negative for fever, malaise/fatigue and weight loss.  Respiratory: Negative.  Negative for cough and shortness of breath.   Cardiovascular: Negative.  Negative for chest pain and leg swelling.  Gastrointestinal: Negative.  Negative for abdominal pain.  Genitourinary: Negative.  Negative for dysuria.  Musculoskeletal: Negative.  Negative for back pain.  Skin: Negative.  Negative for rash.  Neurological: Negative.  Negative for dizziness, focal weakness, weakness and headaches.  Psychiatric/Behavioral: Negative.  The patient is not nervous/anxious.     As per HPI. Otherwise, a complete review of systems is negative.  PAST MEDICAL HISTORY: Past Medical History:   Diagnosis Date   Aortic atherosclerosis (HCC)    a. Noted on CT abd 08/2019.   Breast cancer [facility]) 1997   right breast, radiation   Bronchitis    recent/ 06/09/15 had chest xray/ [name]/resolved   Cancer [facility]) 1997   right lumpectomy,L/Ad/R    GERD (gastroesophageal reflux disease)    History of hiatal hernia    Hyperlipidemia    Low BP    a. typically 80's/60's   Osteopenia    Panic attack    Paraesophageal hernia    Personal history of malignant neoplasm of breast    Personal history of radiation therapy     PAST SURGICAL HISTORY: Past Surgical History:  Procedure Laterality Date   BICEPT TENODESIS Left 11/23/2016   Procedure: BICEPS TENODESIS;  Surgeon: Signa Kell, MD;  Location: [facility] ORS;  Service: Orthopedics;  Laterality: Left;   BREAST BIOPSY Right 12/21/2021   MM RT RADIO FREQUENCY TAG LOC MAMMO GUIDE 12/21/2021 [facility]   BREAST EXCISIONAL BIOPSY Right 1997   pos   BREAST LUMPECTOMY Right 12/25/2021   breast cancer   BREAST SURGERY Right 1997   lumpectomy   CHOLECYSTECTOMY N/A 08/06/2016   Procedure: LAPAROSCOPIC CHOLECYSTECTOMY WITH INTRAOPERATIVE CHOLANGIOGRAM;  Surgeon: Kieth Brightly, MD;  Location: [facility] ORS;  Service: General;  Laterality: N/A;   COLONOSCOPY  2008   COLONOSCOPY WITH PROPOFOL N/A 07/21/2015   Procedure: COLONOSCOPY WITH PROPOFOL;  Surgeon: Midge Minium, MD;  Location: [facility];  Service: Endoscopy;  Laterality: N/A;   COLONOSCOPY WITH PROPOFOL N/A 01/05/2018   Procedure: COLONOSCOPY WITH PROPOFOL;  Surgeon: Wyline Mood, MD;  Location: [facility] ENDOSCOPY;  Service: Gastroenterology;  Laterality: N/A;   COLONOSCOPY WITH PROPOFOL N/A 03/23/2023   Procedure: COLONOSCOPY  WITH PROPOFOL;  Surgeon: Wyline Mood, MD;  Location: [facility] ENDOSCOPY;  Service: Gastroenterology;  Laterality: N/A;   ESOPHAGOGASTRODUODENOSCOPY (EGD) WITH PROPOFOL N/A 06/16/2016   Procedure: ESOPHAGOGASTRODUODENOSCOPY (EGD) WITH PROPOFOL;   Surgeon: Kieth Brightly, MD;  Location: [facility] ENDOSCOPY;  Service: Endoscopy;  Laterality: N/A;   ESOPHAGOGASTRODUODENOSCOPY (EGD) WITH PROPOFOL N/A 01/05/2018   Procedure: ESOPHAGOGASTRODUODENOSCOPY (EGD) WITH PROPOFOL;  Surgeon: Wyline Mood, MD;  Location: [facility] ENDOSCOPY;  Service: Gastroenterology;  Laterality: N/A;   ESOPHAGOGASTRODUODENOSCOPY (EGD) WITH PROPOFOL N/A 06/13/2018   Procedure: ESOPHAGOGASTRODUODENOSCOPY (EGD) WITH PROPOFOL;  Surgeon: Wyline Mood, MD;  Location: [facility] ENDOSCOPY;  Service: Gastroenterology;  Laterality: N/A;   ESOPHAGOGASTRODUODENOSCOPY (EGD) WITH PROPOFOL N/A 07/16/2018   Procedure: ESOPHAGOGASTRODUODENOSCOPY (EGD) WITH PROPOFOL;  Surgeon: Midge Minium, MD;  Location: [facility] ENDOSCOPY;  Service: Endoscopy;  Laterality: N/A;   HARDWARE REMOVAL Left 12/27/2016   Procedure: HARDWARE REMOVAL LEFT  PROXIMAL HUMEROUS;  Surgeon: Signa Kell, MD;  Location: [facility];  Service: Orthopedics;  Laterality: Left;   IR GJ TUBE CHANGE  04/21/2018   NISSEN FUNDOPLICATION N/A 03/09/2018   Procedure: NISSEN FUNDOPLICATION;  Surgeon: Leafy Ro, MD;  Location: [facility];  Service: General;  Laterality: N/A;   ORIF HUMERUS FRACTURE Left 11/23/2016   Procedure: OPEN REDUCTION INTERNAL FIXATION (ORIF) PROXIMAL HUMERUS FRACTURE;  Surgeon: Signa Kell, MD;  Location: [facility];  Service: Orthopedics;  Laterality: Left;   PART MASTECTOMY,RADIO FREQUENCY LOCALIZER,AXILLARY SENTINEL NODE BIOPSY Right 12/25/2021   Procedure: PART MASTECTOMY,RADIO FREQUENCY LOCALIZER,AXILLARY SENTINEL NODE BIOPSY;  Surgeon: [name], DO;  Location: [facility];  Service: General;  Laterality: Right;   POLYPECTOMY  03/23/2023   Procedure: POLYPECTOMY;  Surgeon: Wyline Mood, MD;  Location: [facility] ENDOSCOPY;  Service: Gastroenterology;;   REPAIR OF ESOPHAGUS  03/09/2018   Procedure: REPAIR OF ESOPHAGUS;  Surgeon: Leafy Ro, MD;  Location: [facility];  Service: General;;   REVERSE SHOULDER ARTHROPLASTY  Left 12/27/2016   Procedure: REVERSE SHOULDER ARTHROPLASTY;  Surgeon: Signa Kell, MD;  Location: [facility];  Service: Orthopedics;  Laterality: Left;   ROBOTIC ASSISTED LAPAROSCOPIC REPAIR OF PARAESOPHAGEAL HERNIA N/A 03/09/2018   Procedure: ROBOTIC HIATAL HERNIA CONVERTED TO OPEN;  Surgeon: Leafy Ro, MD;  Location: [facility];  Service: General;  Laterality: N/A;    FAMILY HISTORY: Family History  Problem Relation Age of Onset   Kidney disease Mother    Cancer Father        stomach   Heart attack Father    Hypertension Father    Alcohol abuse Father    Anxiety disorder Brother    Obesity Brother    COPD Brother    Depression Brother    Anxiety disorder Brother    Cancer Maternal Grandmother    Breast cancer Maternal Grandmother     ADVANCED DIRECTIVES (Y/N):  N  HEALTH MAINTENANCE: Social History   Tobacco Use   Smoking status: Every Day    Current packs/day: 0.15    Average packs/day: 0.2 packs/day for 13.4 years (2.0 ttl pk-yrs)    Types: Cigarettes    Start date: 11/25/2009   Smokeless tobacco: Never   Tobacco comments:    3 cigarette daily  Vaping Use   Vaping status: Never Used  Substance Use Topics   Alcohol use: No    Alcohol/week: 0.0 standard drinks of alcohol   Drug use: No     Colonoscopy:  PAP:  Bone density:  Lipid panel:  No Known Allergies  Current Outpatient Medications  Medication Sig Dispense Refill  atorvastatin (LIPITOR) 20 MG tablet Take 1 tablet (20 mg total) by mouth daily. 90 tablet 1   buPROPion (WELLBUTRIN XL) 300 MG 24 hr tablet One tab Once a day 90 tablet 1   busPIRone (BUSPAR) 10 MG tablet Take 1 tablet (10 mg total) by mouth 2 (two) times daily. 180 tablet 1   letrozole (FEMARA) 2.5 MG tablet TAKE 1 TABLET(2.5 MG) BY MOUTH DAILY 90 tablet 3   pantoprazole (PROTONIX) 40 MG tablet Take 1 tablet (40 mg total) by mouth daily. 90 tablet 3   trazodone (DESYREL) 300 MG tablet TAKE 1 TABLET(300 MG) BY MOUTH AT BEDTIME 90 tablet  0   Vitamin D, Ergocalciferol, (DRISDOL) 1.25 MG (50000 UNIT) CAPS capsule Take 1 capsule (50,000 Units total) by mouth every 7 (seven) days. 12 capsule 0   No current facility-administered medications for this visit.   Facility-Administered Medications Ordered in Other Visits  Medication Dose Route Frequency Provider Last Rate Last Admin   lidocaine (PF) (XYLOCAINE) 1 % injection 10 mL  10 mL Other Once [place], Isami, DO        OBJECTIVE: Vitals:   04/28/23 1041  BP: 135/85  Pulse: 78  Resp: 18  Temp: 98.4 F (36.9 C)  SpO2: 97%     Body mass index is 34.7 kg/m.    ECOG FS:0 - Asymptomatic  General: Well-developed, well-nourished, no acute distress. Eyes: Pink conjunctiva, anicteric sclera. HEENT: Normocephalic, moist mucous membranes. Breasts: Exam deferred today. Lungs: No audible wheezing or coughing. Heart: Regular rate and rhythm. Abdomen: Soft, nontender, no obvious distention. Musculoskeletal: No edema, cyanosis, or clubbing. Neuro: Alert, answering all questions appropriately. Cranial nerves grossly intact. Skin: No rashes or petechiae noted. Psych: Normal affect.   LAB RESULTS:  Lab Results  Component Value Date   NA 138 01/28/2023   K 4.2 01/28/2023   CL 102 01/28/2023   CO2 21 01/28/2023   GLUCOSE 112 (H) 01/28/2023   BUN 13 01/28/2023   CREATININE 0.98 01/28/2023   CALCIUM 9.2 01/28/2023   PROT 6.3 01/28/2023   ALBUMIN 4.1 01/28/2023   AST 13 01/28/2023   ALT 13 01/28/2023   ALKPHOS 143 (H) 01/28/2023   BILITOT 0.5 01/28/2023   GFRNONAA >60 12/23/2021   GFRAA 93 01/21/2020    Lab Results  Component Value Date   WBC 5.4 01/28/2023   NEUTROABS 3.6 01/28/2023   HGB 13.4 01/28/2023   HCT 41.0 01/28/2023   MCV 92 01/28/2023   PLT 229 01/28/2023     STUDIES: ECHOCARDIOGRAM COMPLETE Result Date: 04/19/2023    ECHOCARDIOGRAM REPORT   Patient Name:   Chelsey [name] Date of Exam: 04/19/2023 Medical Rec #:  [id number]        Height:       65.0 in  Accession #:    [id number]       Weight:       212.0 lb Date of Birth:  [birth year]        BSA:          2.028 m Patient Age:    72 years         BP:           98/60 mmHg Patient Gender: F                HR:           80 bpm. Exam Location:  [place] Procedure: 2D Echo, 3D Echo, Cardiac Doppler and Color Doppler (Both Spectral  and Color Flow Doppler were utilized during procedure). Indications:    R06.02 SOB  History:        Patient has no prior history of Echocardiogram examinations.                 Signs/Symptoms:Shortness of Breath; Risk Factors:Dyslipidemia                 and Current Smoker.  Sonographer:    Quentin Ore RDMS, RVT, RDCS Referring Phys: [id number] CHRISTOPHER RONALD BERGE IMPRESSIONS  1. Left ventricular ejection fraction, by estimation, is 60 to 65%. The left ventricle has normal function. The left ventricle has no regional wall motion abnormalities. Left ventricular diastolic parameters are consistent with Grade I diastolic dysfunction (impaired relaxation).  2. Right ventricular systolic function is normal. The right ventricular size is normal.  3. Left atrial size was moderately dilated.  4. The mitral valve is normal in structure. Moderate mitral valve regurgitation. No evidence of mitral stenosis.  5. The aortic valve is normal in structure. Aortic valve regurgitation is not visualized. Aortic valve sclerosis/calcification is present, without any evidence of aortic stenosis.  6. The inferior vena cava is normal in size with greater than 50% respiratory variability, suggesting right atrial pressure of 3 mmHg. FINDINGS  Left Ventricle: Left ventricular ejection fraction, by estimation, is 60 to 65%. The left ventricle has normal function. The left ventricle has no regional wall motion abnormalities. Strain was performed and the global longitudinal strain is indeterminate. The left ventricular internal cavity size was normal in size. There is no left ventricular hypertrophy. Left  ventricular diastolic parameters are consistent with Grade I diastolic dysfunction (impaired relaxation). Right Ventricle: The right ventricular size is normal. No increase in right ventricular wall thickness. Right ventricular systolic function is normal. Left Atrium: Left atrial size was moderately dilated. Right Atrium: Right atrial size was normal in size. Pericardium: There is no evidence of pericardial effusion. Mitral Valve: The mitral valve is normal in structure. Moderate mitral valve regurgitation. No evidence of mitral valve stenosis. Tricuspid Valve: The tricuspid valve is normal in structure. Tricuspid valve regurgitation is mild . No evidence of tricuspid stenosis. Aortic Valve: The aortic valve is normal in structure. Aortic valve regurgitation is not visualized. Aortic valve sclerosis/calcification is present, without any evidence of aortic stenosis. Aortic valve mean gradient measures 5.0 mmHg. Aortic valve peak  gradient measures 10.1 mmHg. Aortic valve area, by VTI measures 2.16 cm. Pulmonic Valve: The pulmonic valve was normal in structure. Pulmonic valve regurgitation is not visualized. No evidence of pulmonic stenosis. Aorta: The aortic root is normal in size and structure. Venous: The inferior vena cava is normal in size with greater than 50% respiratory variability, suggesting right atrial pressure of 3 mmHg. IAS/Shunts: No atrial level shunt detected by color flow Doppler. Additional Comments: 3D was performed not requiring image post processing on an independent workstation and was indeterminate.  LEFT VENTRICLE PLAX 2D LVOT diam:     2.00 cm     Diastology LV SV:         63          LV e' medial:    5.66 cm/s LV SV Index:   31          LV E/e' medial:  13.3 LVOT Area:     3.14 cm    LV e' lateral:   6.20 cm/s  LV E/e' lateral: 12.2  LV Volumes (MOD) LV vol d, MOD A2C: 70.9 ml LV vol d, MOD A4C: 75.8 ml LV vol s, MOD A2C: 31.4 ml 3D Volume EF: LV vol s, MOD A4C:  36.3 ml 3D EF:        51 % LV SV MOD A2C:     39.5 ml LV EDV:       101 ml LV SV MOD A4C:     75.8 ml LV ESV:       50 ml LV SV MOD BP:      41.2 ml LV SV:        51 ml RIGHT VENTRICLE RV Basal diam:  3.00 cm RV S prime:     12.30 cm/s TAPSE (M-mode): 2.4 cm LEFT ATRIUM             Index        RIGHT ATRIUM           Index LA diam:        4.00 cm 1.97 cm/m   RA Area:     11.50 cm LA Vol (A2C):   81.2 ml 40.04 ml/m  RA Volume:   25.30 ml  12.48 ml/m LA Vol (A4C):   65.6 ml 32.35 ml/m LA Biplane Vol: 73.8 ml 36.40 ml/m  AORTIC VALVE                     PULMONIC VALVE AV Area (Vmax):    2.11 cm      PV Vmax:       1.05 m/s AV Area (Vmean):   2.03 cm      PV Peak grad:  4.4 mmHg AV Area (VTI):     2.16 cm AV Vmax:           159.00 cm/s AV Vmean:          108.000 cm/s AV VTI:            0.294 m AV Peak Grad:      10.1 mmHg AV Mean Grad:      5.0 mmHg LVOT Vmax:         107.00 cm/s LVOT Vmean:        69.700 cm/s LVOT VTI:          0.202 m LVOT/AV VTI ratio: 0.69  AORTA Ao Root diam: 2.90 cm Ao Asc diam:  3.20 cm Ao Arch diam: 2.7 cm MITRAL VALVE                TRICUSPID VALVE MV Area (PHT): 3.21 cm     TR Peak grad:   23.2 mmHg MV Decel Time: 236 msec     TR Vmax:        241.00 cm/s MV E velocity: 75.50 cm/s MV A velocity: 136.00 cm/s  SHUNTS MV E/A ratio:  0.56         Systemic VTI:  0.20 m                             Systemic Diam: 2.00 cm Julien Nordmann MD Electronically signed by Julien Nordmann MD Signature Date/Time: 04/19/2023/7:22:26 PM    Final     ASSESSMENT: Pathologic stage Ia ER/PR positive, HER2 negative invasive carcinoma of the right breast.  Oncotype Dx 8  PLAN:    Pathologic stage Ia ER/PR positive, HER2 negative invasive carcinoma of the right breast: Patient has a distant  history of right breast cancer in 1997.  She reports she underwent lumpectomy and XRT at that time.  She did not have chemotherapy or hormonal therapy.  Patient underwent lumpectomy on December 25, 2021 confirming stage  of disease.  Oncotype Dx score was 8 which is considered low risk therefore no adjuvant chemotherapy was necessary.  Patient completed 10 fractions of partial breast irradiation in January 2024.  Continue letrozole for total 5 years completing treatment in January 2029.  Her most recent mammogram on October 26, 2022 was reported as BI-RADS 2.  Repeat in October 2025.  Return to clinic in 6 months for routine evaluation.   Osteopenia: Chronic and unchanged.  Patient's most recent bone mineral density on March 21, 2023 reported a T-score of -2.1.  This is essentially unchanged from 1 year prior where her T-score was reported -2.0.  Continue calcium and vitamin D supplementation.  Repeat bone mineral density in February 2026.    I spent a total of 20 minutes reviewing chart data, face-to-face evaluation with the patient, counseling and coordination of care as detailed above.   Patient expressed understanding and was in agreement with this plan. She also understands that She can call clinic at any time with any questions, concerns, or complaints.    Cancer Staging  Invasive ductal carcinoma of right breast in female [facility]) Staging form: Breast, AJCC 8th Edition - Clinical stage from 12/02/2021: Stage IA (cT1c, cN0, cM0, G2, ER+, PR+, HER2-) - Signed by Jeralyn Ruths, MD on 12/02/2021 Stage prefix: Initial diagnosis Histologic grading system: 3 grade system   Jeralyn Ruths, MD   04/30/2023 8:12 AM

## 2023-04-28 NOTE — Progress Notes (Signed)
 BP 124/84   Pulse 80   Temp 97.9 F (36.6 C) (Oral)   Ht 5\' 6"  (1.676 m)   Wt 215 lb 8 oz (97.8 kg)   LMP  (LMP Unknown)   SpO2 96%   BMI 34.78 kg/m    Subjective:    Patient ID: Chelsey [name], female    DOB: 11/18/1950, 73 y.o.   MRN: [id number]  HPI: Chelsey [name] is a 73 y.o. female  Chief Complaint  Patient presents with   Shortness of Breath   Fatigue    Patient complains of SOB in exertion.    Medication Refill    Patient request refills for Vit D and Trazodone.    SHORTNESS OF BREATH Duration: a couple of years now Onset: gradual Description of breathing discomfort: SOB Severity: moderate Episode duration: few minutes Frequency: with any exertion Related to exertion: yes Cough: yes Chest tightness: no Wheezing: no Fevers: no Chest pain: no Palpitations: no  Nausea: no Diaphoresis: no Deconditioning: yes Status: stable  Relevant past medical, surgical, family and social history reviewed and updated as indicated. Interim medical history since our last visit reviewed. Allergies and medications reviewed and updated.  Review of Systems  Constitutional: Negative.   Respiratory:  Positive for cough and shortness of breath. Negative for apnea, choking, chest tightness, wheezing and stridor.   Cardiovascular: Negative.   Gastrointestinal: Negative.   Musculoskeletal: Negative.   Neurological: Negative.   Psychiatric/Behavioral: Negative.      Per HPI unless specifically indicated above     Objective:    BP 124/84   Pulse 80   Temp 97.9 F (36.6 C) (Oral)   Ht 5\' 6"  (1.676 m)   Wt 215 lb 8 oz (97.8 kg)   LMP  (LMP Unknown)   SpO2 96%   BMI 34.78 kg/m   Wt Readings from Last 3 Encounters:  04/28/23 215 lb 8 oz (97.8 kg)  03/24/23 212 lb (96.2 kg)  03/23/23 208 lb (94.3 kg)    Physical Exam Vitals and nursing note reviewed.  Constitutional:      General: She is not in acute distress.    Appearance: Normal appearance. She is not  ill-appearing, toxic-appearing or diaphoretic.  HENT:     Head: Normocephalic and atraumatic.     Right Ear: External ear normal.     Left Ear: External ear normal.     Nose: Nose normal.     Mouth/Throat:     Mouth: Mucous membranes are moist.     Pharynx: Oropharynx is clear.  Eyes:     General: No scleral icterus.       Right eye: No discharge.        Left eye: No discharge.     Extraocular Movements: Extraocular movements intact.     Conjunctiva/sclera: Conjunctivae normal.     Pupils: Pupils are equal, round, and reactive to light.  Cardiovascular:     Rate and Rhythm: Normal rate and regular rhythm.     Pulses: Normal pulses.     Heart sounds: Normal heart sounds. No murmur heard.    No friction rub. No gallop.  Pulmonary:     Effort: Pulmonary effort is normal. No respiratory distress.     Breath sounds: Normal breath sounds. No stridor. No wheezing, rhonchi or rales.  Chest:     Chest wall: No tenderness.  Musculoskeletal:        General: Normal range of motion.     Cervical back: Normal  range of motion and neck supple.  Skin:    General: Skin is warm and dry.     Capillary Refill: Capillary refill takes less than 2 seconds.     Coloration: Skin is not jaundiced or pale.     Findings: No bruising, erythema, lesion or rash.  Neurological:     General: No focal deficit present.     Mental Status: She is alert and oriented to person, place, and time. Mental status is at baseline.  Psychiatric:        Mood and Affect: Mood normal.        Behavior: Behavior normal.        Thought Content: Thought content normal.        Judgment: Judgment normal.     Results for orders placed or performed in visit on 04/19/23  ECHOCARDIOGRAM COMPLETE   Collection Time: 04/19/23  1:27 PM  Result Value Ref Range   AR max vel 2.11 cm2   AV Peak grad 10.1 mmHg   Ao pk vel 1.59 m/s   Area-P 1/2 3.21 cm2   AV Area VTI 2.16 cm2   AV Mean grad 5.0 mmHg   Single Plane A4C EF 52.1 %    Single Plane A2C EF 55.7 %   Calc EF 54.6 %   AV Area mean vel 2.03 cm2   Est EF 60 - 65%       Assessment & Plan:   Problem List Items Addressed This Visit   None Visit Diagnoses       DOE (dyspnea on exertion)    -  Primary   Due for Myoview- not scheduled yet. To see cardiology on Monday. Await their input. Call with any concerns. Follow up for 6 month visit in July.        Follow up plan: Return in about 3 months (around 07/28/2023).   15 minutes spent with patient today.

## 2023-05-02 ENCOUNTER — Encounter: Payer: Self-pay | Admitting: Cardiology

## 2023-05-02 ENCOUNTER — Other Ambulatory Visit: Payer: Medicare Other | Admitting: Cardiology

## 2023-05-02 ENCOUNTER — Ambulatory Visit: Payer: Medicare Other | Attending: Cardiology | Admitting: Cardiology

## 2023-05-02 VITALS — BP 102/64 | HR 95 | Ht 66.0 in | Wt 211.8 lb

## 2023-05-02 DIAGNOSIS — R0683 Snoring: Secondary | ICD-10-CM

## 2023-05-02 DIAGNOSIS — R0609 Other forms of dyspnea: Secondary | ICD-10-CM

## 2023-05-02 DIAGNOSIS — Z720 Tobacco use: Secondary | ICD-10-CM | POA: Diagnosis not present

## 2023-05-02 DIAGNOSIS — E7800 Pure hypercholesterolemia, unspecified: Secondary | ICD-10-CM | POA: Diagnosis not present

## 2023-05-02 NOTE — Progress Notes (Signed)
 Cardiology Office Note:    Date:  05/02/2023   ID:  Chelsey [name], DOB [birth year], MRN [id number]  PCP:  Dorcas Carrow, DO   [facility] Providers Cardiologist:  None     Referring MD: Dorcas Carrow, DO   No chief complaint on file.   History of Present Illness:    Chelsey [name] is a 73 y.o. female with a hx of hyperlipidemia, current smoker x 15+ years presenting for follow-up.    Previously seen in the clinic due to dyspnea on exertion. Echocardiogram was obtained to evaluate any significant abnormalities. Myoview is scheduled in 4days. Still has shortness of breath with minimal exertion. Endorses snoring and daytime fatigue.  Echo 03/2023 showed normal systolic function EF 60 to 65%, aortic sclerosis, impaired relaxation, no significant structural abnormalities to suggest etiology of shortness of breath.  Past Medical History:  Diagnosis Date   Aortic atherosclerosis (HCC)    a. Noted on CT abd 08/2019.   Breast cancer [facility]) 1997   right breast, radiation   Bronchitis    recent/ 06/09/15 had chest xray/ [name]/resolved   Cancer [facility]) 1997   right lumpectomy,L/Ad/R    GERD (gastroesophageal reflux disease)    History of hiatal hernia    Hyperlipidemia    Low BP    a. typically 80's/60's   Osteopenia    Panic attack    Paraesophageal hernia    Personal history of malignant neoplasm of breast    Personal history of radiation therapy     Past Surgical History:  Procedure Laterality Date   BICEPT TENODESIS Left 11/23/2016   Procedure: BICEPS TENODESIS;  Surgeon: Signa Kell, MD;  Location: [facility] ORS;  Service: Orthopedics;  Laterality: Left;   BREAST BIOPSY Right 12/21/2021   MM RT RADIO FREQUENCY TAG LOC MAMMO GUIDE 12/21/2021 [facility]   BREAST EXCISIONAL BIOPSY Right 1997   pos   BREAST LUMPECTOMY Right 12/25/2021   breast cancer   BREAST SURGERY Right 1997   lumpectomy   CHOLECYSTECTOMY N/A 08/06/2016   Procedure:  LAPAROSCOPIC CHOLECYSTECTOMY WITH INTRAOPERATIVE CHOLANGIOGRAM;  Surgeon: Kieth Brightly, MD;  Location: [facility] ORS;  Service: General;  Laterality: N/A;   COLONOSCOPY  2008   COLONOSCOPY WITH PROPOFOL N/A 07/21/2015   Procedure: COLONOSCOPY WITH PROPOFOL;  Surgeon: Midge Minium, MD;  Location: [facility];  Service: Endoscopy;  Laterality: N/A;   COLONOSCOPY WITH PROPOFOL N/A 01/05/2018   Procedure: COLONOSCOPY WITH PROPOFOL;  Surgeon: Wyline Mood, MD;  Location: [facility] ENDOSCOPY;  Service: Gastroenterology;  Laterality: N/A;   COLONOSCOPY WITH PROPOFOL N/A 03/23/2023   Procedure: COLONOSCOPY WITH PROPOFOL;  Surgeon: Wyline Mood, MD;  Location: [facility] ENDOSCOPY;  Service: Gastroenterology;  Laterality: N/A;   ESOPHAGOGASTRODUODENOSCOPY (EGD) WITH PROPOFOL N/A 06/16/2016   Procedure: ESOPHAGOGASTRODUODENOSCOPY (EGD) WITH PROPOFOL;  Surgeon: Kieth Brightly, MD;  Location: [facility] ENDOSCOPY;  Service: Endoscopy;  Laterality: N/A;   ESOPHAGOGASTRODUODENOSCOPY (EGD) WITH PROPOFOL N/A 01/05/2018   Procedure: ESOPHAGOGASTRODUODENOSCOPY (EGD) WITH PROPOFOL;  Surgeon: Wyline Mood, MD;  Location: [facility] ENDOSCOPY;  Service: Gastroenterology;  Laterality: N/A;   ESOPHAGOGASTRODUODENOSCOPY (EGD) WITH PROPOFOL N/A 06/13/2018   Procedure: ESOPHAGOGASTRODUODENOSCOPY (EGD) WITH PROPOFOL;  Surgeon: Wyline Mood, MD;  Location: [facility] ENDOSCOPY;  Service: Gastroenterology;  Laterality: N/A;   ESOPHAGOGASTRODUODENOSCOPY (EGD) WITH PROPOFOL N/A 07/16/2018   Procedure: ESOPHAGOGASTRODUODENOSCOPY (EGD) WITH PROPOFOL;  Surgeon: Midge Minium, MD;  Location: [facility] ENDOSCOPY;  Service: Endoscopy;  Laterality: N/A;   HARDWARE REMOVAL Left 12/27/2016   Procedure: HARDWARE REMOVAL LEFT  PROXIMAL HUMEROUS;  Surgeon: Signa Kell, MD;  Location: [facility] OR[facility];  Service: Orthopedics;  Laterality: Left;   IR GJ TUBE CHANGE  04/21/2018   NISSEN FUNDOPLICATION N/A 03/09/2018   Procedure: NISSEN FUNDOPLICATION;  Surgeon: Leafy Ro, MD;  Location: [facility] OR[facility];  Service: General;  Laterality: N/A;   ORIF HUMERUS FRACTURE Left 11/23/2016   Procedure: OPEN REDUCTION INTERNAL FIXATION (ORIF) PROXIMAL HUMERUS FRACTURE;  Surgeon: Signa Kell, MD;  Location: [facility] ORS;  Service: Orthopedics;  Laterality: Left;   PART MASTECTOMY,RADIO FREQUENCY LOCALIZER,AXILLARY SENTINEL NODE BIOPSY Right 12/25/2021   Procedure: PART MASTECTOMY,RADIO FREQUENCY LOCALIZER,AXILLARY SENTINEL NODE BIOPSY;  Surgeon: [name], DO;  Location: [facility] OR[facility];  Service: General;  Laterality: Right;   POLYPECTOMY  03/23/2023   Procedure: POLYPECTOMY;  Surgeon: Wyline Mood, MD;  Location: [facility] ENDOSCOPY;  Service: Gastroenterology;;   REPAIR OF ESOPHAGUS  03/09/2018   Procedure: REPAIR OF ESOPHAGUS;  Surgeon: Leafy Ro, MD;  Location: [facility] ORS;  Service: General;;   REVERSE SHOULDER ARTHROPLASTY Left 12/27/2016   Procedure: REVERSE SHOULDER ARTHROPLASTY;  Surgeon: Signa Kell, MD;  Location: [facility];  Service: Orthopedics;  Laterality: Left;   ROBOTIC ASSISTED LAPAROSCOPIC REPAIR OF PARAESOPHAGEAL HERNIA N/A 03/09/2018   Procedure: ROBOTIC HIATAL HERNIA CONVERTED TO OPEN;  Surgeon: Leafy Ro, MD;  Location: [facility];  Service: General;  Laterality: N/A;    Current Medications: Current Meds  Medication Sig   atorvastatin (LIPITOR) 20 MG tablet Take 1 tablet (20 mg total) by mouth daily.   buPROPion (WELLBUTRIN XL) 300 MG 24 hr tablet One tab Once a day   busPIRone (BUSPAR) 10 MG tablet Take 1 tablet (10 mg total) by mouth 2 (two) times daily.   letrozole (FEMARA) 2.5 MG tablet TAKE 1 TABLET(2.5 MG) BY MOUTH DAILY   pantoprazole (PROTONIX) 40 MG tablet Take 1 tablet (40 mg total) by mouth daily.   trazodone (DESYREL) 300 MG tablet TAKE 1 TABLET(300 MG) BY MOUTH AT BEDTIME   Vitamin D, Ergocalciferol, (DRISDOL) 1.25 MG (50000 UNIT) CAPS capsule Take 1 capsule (50,000 Units total) by mouth every 7 (seven) days.     Allergies:   Patient has no  known allergies.   Social History   Socioeconomic History   Marital status: Widowed    Spouse name: Not on file   Number of children: 2   Years of education: Not on file   Highest education level: Bachelor's degree (e.g., BA, AB, BS)  Occupational History   Occupation: retired  Tobacco Use   Smoking status: Every Day    Current packs/day: 0.15    Average packs/day: 0.2 packs/day for 13.4 years (2.0 ttl pk-yrs)    Types: Cigarettes    Start date: 11/25/2009   Smokeless tobacco: Never   Tobacco comments:    2 cigarette daily  Vaping Use   Vaping status: Never Used  Substance and Sexual Activity   Alcohol use: No    Alcohol/week: 0.0 standard drinks of alcohol   Drug use: No   Sexual activity: Never  Other Topics Concern   Not on file  Social History Narrative   Lives with son.  Does not routinely exercise.   Social Drivers of Corporate investment banker Strain: Low Risk  (01/06/2023)   Overall Financial Resource Strain (CARDIA)    Difficulty of Paying Living Expenses: Not very hard  Food Insecurity: No Food Insecurity (01/06/2023)   Hunger Vital Sign    Worried About Running Out of Food in the Last  Year: Never true    Ran Out of Food in the Last Year: Never true  Transportation Needs: No Transportation Needs (01/06/2023)   PRAPARE - Administrator, Civil Service (Medical): No    Lack of Transportation (Non-Medical): No  Physical Activity: Inactive (01/06/2023)   Exercise Vital Sign    Days of Exercise per Week: 0 days    Minutes of Exercise per Session: 0 min  Stress: No Stress Concern Present (01/06/2023)   [employer] of Occupational Health - Occupational Stress Questionnaire    Feeling of Stress : Not at all  Social Connections: Socially Isolated (01/06/2023)   Social Connection and Isolation Panel [NHANES]    Frequency of Communication with Friends and Family: Three times a week    Frequency of Social Gatherings with Friends and Family: Once a  week    Attends Religious Services: Never    Database administrator or Organizations: No    Attends Banker Meetings: Never    Marital Status: Widowed     Family History: The patient's family history includes Alcohol abuse in her father; Anxiety disorder in her brother and brother; Breast cancer in her maternal grandmother; COPD in her brother; Cancer in her father and maternal grandmother; Depression in her brother; Heart attack in her father; Hypertension in her father; Kidney disease in her mother; Obesity in her brother.  ROS:   Please see the history of present illness.     All other systems reviewed and are negative.  EKGs/Labs/Other Studies Reviewed:    The following studies were reviewed today:       Recent Labs: 01/28/2023: ALT 13; BUN 13; Creatinine, Ser 0.98; Hemoglobin 13.4; Platelets 229; Potassium 4.2; Sodium 138; TSH 5.030  Recent Lipid Panel    Component Value Date/Time   CHOL 170 01/28/2023 0931   CHOL 177 01/05/2016 0850   TRIG 129 01/28/2023 0931   TRIG 73 01/05/2016 0850   HDL 46 01/28/2023 0931   CHOLHDL 3.2 10/02/2019 0729   VLDL 23 10/02/2019 0729   VLDL 15 01/05/2016 0850   LDLCALC 101 (H) 01/28/2023 0931     Risk Assessment/Calculations:             Physical Exam:    VS:  BP 102/64   Pulse 95   Ht 5\' 6"  (1.676 m)   Wt 211 lb 12.8 oz (96.1 kg)   LMP  (LMP Unknown)   SpO2 97%   BMI 34.19 kg/m     Wt Readings from Last 3 Encounters:  05/02/23 211 lb 12.8 oz (96.1 kg)  04/28/23 215 lb (97.5 kg)  04/28/23 215 lb 8 oz (97.8 kg)     GEN:  Well nourished, well developed in no acute distress HEENT: Normal NECK: No JVD; No carotid bruits CARDIAC: RRR, no murmurs, rubs, gallops RESPIRATORY:  Clear to auscultation without rales, wheezing or rhonchi  ABDOMEN: Soft, non-tender, non-distended MUSCULOSKELETAL:  No edema; No deformity  SKIN: Warm and dry NEUROLOGIC:  Alert and oriented x 3 PSYCHIATRIC:  Normal affect    ASSESSMENT:    1. Dyspnea on exertion   2. Smoking trying to quit   3. Snoring   4. Pure hypercholesterolemia    PLAN:    In order of problems listed above:  Dyspnea on exertion, echo with normal EF. Obtain myoview as scheduled.  Other differential for shortness of breath include OSA, COPD as patient is a current smoker, deconditioning/obesity.  Referral to pulmonary medicine placed due  to smoking history.  Increased activity, weight loss advised. Current smoker, smoking cessation advised. Snoring, daytime fatigue.  Refer to sleep specialist for OSA eval. Hyperlipidemia, continue Lipitor.  Follow-up after cardiac testing.      Medication Adjustments/Labs and Tests Ordered: Current medicines are reviewed at length with the patient today.  Concerns regarding medicines are outlined above.  Orders Placed This Encounter  Procedures   Ambulatory referral to Pulmonology   No orders of the defined types were placed in this encounter.   Patient Instructions  Medication Instructions:   Your Physician recommend you continue on your current medication as directed.     *If you need a refill on your cardiac medications before your next appointment, please call your pharmacy*  Lab Work:  No labs ordered today.  Testing/Procedures:  No test ordered today.   Follow-Up: At [facility], you and your health needs are our priority.  As part of our continuing mission to provide you with exceptional heart care, our providers are all part of one team.  This team includes your primary Cardiologist (physician) and Advanced Practice Providers or APPs (Physician Assistants and Nurse Practitioners) who all work together to provide you with the care you need, when you need it.  Your next appointment:   2 month(s)  Provider:   You may see Debbe Odea, MD or one of the following Advanced Practice Providers on your designated Care Team:   Nicolasa Ducking, NP Ames Dura,  PA-C Eula Listen, PA-C Cadence Dwight, PA-C Charlsie Quest, NP Carlos Levering, NP    We recommend signing up for the patient portal called "MyChart".  Sign up information is provided on this After Visit Summary.  MyChart is used to connect with patients for Virtual Visits (Telemedicine).  Patients are able to view lab/test results, encounter notes, upcoming appointments, etc.  Non-urgent messages can be sent to your provider as well.   To learn more about what you can do with MyChart, go to [email or web address].   Instructions: Today you have been referred to pulmonary medicine.       Signed, Debbe Odea, MD  05/02/2023 3:18 PM    [facility]

## 2023-05-02 NOTE — Patient Instructions (Signed)
 Medication Instructions:   Your Physician recommend you continue on your current medication as directed.     *If you need a refill on your cardiac medications before your next appointment, please call your pharmacy*  Lab Work:  No labs ordered today.  Testing/Procedures:  No test ordered today.   Follow-Up: At [facility], you and your health needs are our priority.  As part of our continuing mission to provide you with exceptional heart care, our providers are all part of one team.  This team includes your primary Cardiologist (physician) and Advanced Practice Providers or APPs (Physician Assistants and Nurse Practitioners) who all work together to provide you with the care you need, when you need it.  Your next appointment:   2 month(s)  Provider:   You may see Debbe Odea, MD or one of the following Advanced Practice Providers on your designated Care Team:   Nicolasa Ducking, NP Ames Dura, PA-C Eula Listen, PA-C Cadence [place], PA-C Charlsie Quest, NP Carlos Levering, NP    We recommend signing up for the patient portal called "MyChart".  Sign up information is provided on this After Visit Summary.  MyChart is used to connect with patients for Virtual Visits (Telemedicine).  Patients are able to view lab/test results, encounter notes, upcoming appointments, etc.  Non-urgent messages can be sent to your provider as well.   To learn more about what you can do with MyChart, go to [email or web address].   Instructions: Today you have been referred to pulmonary medicine.

## 2023-05-06 ENCOUNTER — Encounter
Admission: RE | Admit: 2023-05-06 | Discharge: 2023-05-06 | Disposition: A | Discharge status: Home / Self Care | Source: Ambulatory Visit | Attending: Nurse Practitioner | Admitting: Nurse Practitioner

## 2023-05-06 DIAGNOSIS — R0609 Other forms of dyspnea: Secondary | ICD-10-CM | POA: Diagnosis not present

## 2023-05-06 LAB — NM MYOCAR MULTI W/SPECT W/WALL MOTION / EF
LV dias vol: 25 mL (ref 46–106)
LV sys vol: 8 mL
Nuc Stress EF: 68 %
Peak HR: 94 {beats}/min
Percent HR: 63 %
Rest HR: 64 {beats}/min
Rest Nuclear Isotope Dose: 10.3 mCi
SDS: 3
SRS: 9
SSS: 2
ST Depression (mm): 0 mm
Stress Nuclear Isotope Dose: 31 mCi
TID: 0.87

## 2023-05-06 MED ORDER — TECHNETIUM TC 99M TETROFOSMIN IV KIT
10.3300 | PACK | Freq: Once | INTRAVENOUS | Status: AC | PRN
Start: 2023-05-06 — End: 2023-05-06
  Administered 2023-05-06: 10.33 via INTRAVENOUS

## 2023-05-06 MED ORDER — REGADENOSON 0.4 MG/5ML IV SOLN
0.4000 mg | Freq: Once | INTRAVENOUS | Status: AC
  Administered 2023-05-06: 0.4 mg via INTRAVENOUS

## 2023-05-06 MED ORDER — TECHNETIUM TC 99M TETROFOSMIN IV KIT
30.9600 | PACK | Freq: Once | INTRAVENOUS | Status: AC | PRN
  Administered 2023-05-06: 30.96 via INTRAVENOUS

## 2023-05-09 MED ORDER — ASPIRIN 81 MG PO TBEC: 81.0000 mg | DELAYED_RELEASE_TABLET | Freq: Every day | ORAL | Status: AC

## 2023-05-09 NOTE — Addendum Note (Signed)
 Addended by: Otho Blitz on: 05/09/2023 01:50 PM   Modules accepted: Orders

## 2023-06-01 ENCOUNTER — Ambulatory Visit: Admitting: Internal Medicine

## 2023-06-01 ENCOUNTER — Encounter: Payer: Self-pay | Admitting: Internal Medicine

## 2023-06-01 VITALS — BP 110/80 | HR 93 | Temp 98.5°F | Ht 65.0 in | Wt 210.0 lb

## 2023-06-01 DIAGNOSIS — R0609 Other forms of dyspnea: Secondary | ICD-10-CM

## 2023-06-01 DIAGNOSIS — Z720 Tobacco use: Secondary | ICD-10-CM

## 2023-06-01 DIAGNOSIS — R5381 Other malaise: Secondary | ICD-10-CM

## 2023-06-01 DIAGNOSIS — G4733 Obstructive sleep apnea (adult) (pediatric): Secondary | ICD-10-CM

## 2023-06-01 DIAGNOSIS — F17210 Nicotine dependence, cigarettes, uncomplicated: Secondary | ICD-10-CM | POA: Diagnosis not present

## 2023-06-01 DIAGNOSIS — R0602 Shortness of breath: Secondary | ICD-10-CM

## 2023-06-01 DIAGNOSIS — R0683 Snoring: Secondary | ICD-10-CM

## 2023-06-01 DIAGNOSIS — Z6834 Body mass index (BMI) 34.0-34.9, adult: Secondary | ICD-10-CM

## 2023-06-01 DIAGNOSIS — J449 Chronic obstructive pulmonary disease, unspecified: Secondary | ICD-10-CM

## 2023-06-01 MED ORDER — ALBUTEROL SULFATE HFA 108 (90 BASE) MCG/ACT IN AERS: 2.0000 | INHALATION_SPRAY | RESPIRATORY_TRACT | 2 refills | Status: AC | PRN

## 2023-06-01 NOTE — Patient Instructions (Signed)
 Will assess for sleep apnea with home sleep test Assess for COPD with pulmonary function testing Albuterol  2 puffs every 4 hours as needed can take 2 puffs prior to exertion Recommend lung cancer screening program referral  Please stop smoking  Avoid Allergens and Irritants Avoid secondhand smoke Avoid SICK contacts Recommend  Masking  when appropriate Recommend Keep up-to-date with vaccinations

## 2023-06-01 NOTE — Progress Notes (Signed)
 Name: Chelsey [name] MRN: [id number] DOB: 10/17/1950    CHIEF COMPLAINT:  EXCESSIVE DAYTIME SLEEPINESS Assess for COPD   HISTORY OF PRESENT ILLNESS:  CARDIAC EVALUATION Assessment of DOE Echocardiogram was obtained to evaluate any significant abnormalities. Myoview  is scheduled in 4 days. Still has shortness of breath with minimal exertion. Endorses snoring and daytime fatigue.  Echo 03/2023 showed normal systolic function EF 60 to 65%, aortic sclerosis, impaired relaxation, no significant structural abnormalities to suggest etiology of shortness of breath. Referred to us  for further assessment  Patient  has been having sleep problems for many years Patient has been having excessive daytime sleepiness for a long time Patient has been having extreme fatigue and tiredness, lack of energy +  very Loud snoring every night  Discussed sleep data and reviewed with patient.  Encouraged proper weight management.  Discussed driving precautions and its relationship with hypersomnolence.  Discussed operating dangerous equipment and its relationship with hypersomnolence.  Discussed sleep hygiene, and benefits of a fixed sleep waked time.  The importance of getting eight or more hours of sleep discussed with patient.    EPWORTH SLEEP SCORE 3   Patient with extensive smoking history Patient with dyspnea exertion shortness of breath worsening over the last 1 year Patient has intermittent productive cough No signs of wheezing Patient had previous history of pneumonia Patient with previous history of COVID Patient does not take any type of inhaler therapy Smokes half pack a day for the last 35 years   No exacerbation at this time No evidence of heart failure at this time No evidence or signs of infection at this time No respiratory distress No fevers, chills, nausea, vomiting, diarrhea No evidence of lower extremity edema No evidence hemoptysis I have explained to patient that she  may have COPD and will need pulmonary function testing Ambulating pulse oximetry in the office did not reveal oxygen     PAST MEDICAL HISTORY :   has a past medical history of Aortic atherosclerosis (HCC), Breast cancer (HCC) (1997), Bronchitis, Cancer (HCC) (1997), GERD (gastroesophageal reflux disease), History of hiatal hernia, Hyperlipidemia, Low BP, Osteopenia, Panic attack, Paraesophageal hernia, Personal history of malignant neoplasm of breast, and Personal history of radiation therapy.  has a past surgical history that includes Colonoscopy (2008); Breast surgery (Right, 1997); Colonoscopy with propofol  (N/A, 07/21/2015); Breast excisional biopsy (Right, 1997); Esophagogastroduodenoscopy (egd) with propofol  (N/A, 06/16/2016); Cholecystectomy (N/A, 08/06/2016); ORIF humerus fracture (Left, 11/23/2016); Bicept tenodesis (Left, 11/23/2016); Reverse shoulder arthroplasty (Left, 12/27/2016); Hardware Removal (Left, 12/27/2016); Colonoscopy with propofol  (N/A, 01/05/2018); Esophagogastroduodenoscopy (egd) with propofol  (N/A, 01/05/2018); Robotic assisted laparoscopic repair of paraesophageal hernia (N/A, 03/09/2018); Nissen fundoplication (N/A, 03/09/2018); Repair of esophagus (03/09/2018); IR GJ Tube Change (04/21/2018); Esophagogastroduodenoscopy (egd) with propofol  (N/A, 06/13/2018); Esophagogastroduodenoscopy (egd) with propofol  (N/A, 07/16/2018); Breast biopsy (Right, 12/21/2021); Part mastectomy,radio frequency localizer,axillary sentinel node biopsy (Right, 12/25/2021); Breast lumpectomy (Right, 12/25/2021); Colonoscopy with propofol  (N/A, 03/23/2023); and polypectomy (03/23/2023). Prior to Admission medications   Medication Sig Start Date End Date Taking? Authorizing Provider  aspirin  EC 81 MG tablet Take 1 tablet (81 mg total) by mouth daily. Swallow whole. 05/09/23   Florette Hurry, NP  atorvastatin  (LIPITOR) 20 MG tablet Take 1 tablet (20 mg total) by mouth daily. 01/28/23   Terre Ferri P, DO  buPROPion  (WELLBUTRIN  XL) 300 MG 24 hr tablet One tab Once a day 01/28/23   Terre Ferri P, DO  busPIRone  (BUSPAR ) 10 MG tablet Take 1 tablet (10 mg total) by mouth  2 (two) times daily. 01/28/23   Johnson, Megan P, DO  letrozole  (FEMARA ) 2.5 MG tablet TAKE 1 TABLET(2.5 MG) BY MOUTH DAILY 04/01/23   Shellie Dials, MD  pantoprazole  (PROTONIX ) 40 MG tablet Take 1 tablet (40 mg total) by mouth daily. 07/27/22   Johnson, Megan P, DO  trazodone  (DESYREL ) 300 MG tablet TAKE 1 TABLET(300 MG) BY MOUTH AT BEDTIME 04/28/23   Johnson, Megan P, DO  Vitamin D , Ergocalciferol , (DRISDOL ) 1.25 MG (50000 UNIT) CAPS capsule Take 1 capsule (50,000 Units total) by mouth every 7 (seven) days. 04/28/23   Terre Ferri P, DO   No Known Allergies  FAMILY HISTORY:  family history includes Alcohol abuse in her father; Anxiety disorder in her brother and brother; Breast cancer in her maternal grandmother; COPD in her brother; Cancer in her father and maternal grandmother; Depression in her brother; Heart attack in her father; Hypertension in her father; Kidney disease in her mother; Obesity in her brother. SOCIAL HISTORY:  reports that she has been smoking cigarettes. She started smoking about 13 years ago. She has a 2 pack-year smoking history. She has never used smokeless tobacco. She reports that she does not drink alcohol and does not use drugs.   BP 110/80 (BP Location: Right Arm, Patient Position: Sitting, Cuff Size: Large)   Pulse 93   Temp 98.5 F (36.9 C) (Oral)   Ht 5\' 5"  (1.651 m)   Wt 210 lb (95.3 kg)   LMP  (LMP Unknown)   SpO2 96%   BMI 34.95 kg/m   Review of Systems: Gen:  Denies  fever, sweats, chills weight loss  HEENT: Denies blurred vision, double vision, ear pain, eye pain, hearing loss, nose bleeds, sore throat Cardiac:  No dizziness, chest pain or heaviness, chest tightness,edema, No JVD Resp:   No cough, -sputum production, +shortness of breath,-wheezing, -hemoptysis,  Other:   All other systems negative   Physical Examination:   General Appearance: No distress  EYES PERRLA, EOM intact.   NECK Supple, No JVD Pulmonary: normal breath sounds, No wheezing.  CardiovascularNormal S1,S2.  No m/r/g.   Abdomen: Benign, Soft, non-tender. Neurology UE/LE 5/5 strength, no focal deficits Ext pulses intact, cap refill intact ALL OTHER ROS ARE NEGATIVE  CBC    Component Value Date/Time   WBC 5.4 01/28/2023 0931   WBC 5.6 02/10/2022 1018   RBC 4.46 01/28/2023 0931   RBC 4.53 02/10/2022 1018   HGB 13.4 01/28/2023 0931   HCT 41.0 01/28/2023 0931   PLT 229 01/28/2023 0931   MCV 92 01/28/2023 0931   MCV 86 05/31/2013 0351   MCH 30.0 01/28/2023 0931   MCH 30.2 02/10/2022 1018   MCHC 32.7 01/28/2023 0931   MCHC 32.9 02/10/2022 1018   RDW 13.9 01/28/2023 0931   RDW 15.8 (H) 05/31/2013 0351   LYMPHSABS 1.3 01/28/2023 0931   MONOABS 0.7 04/10/2018 1705   EOSABS 0.1 01/28/2023 0931   BASOSABS 0.0 01/28/2023 0931      Latest Ref Rng & Units 01/28/2023    9:31 AM 07/27/2022   10:11 AM 01/26/2022   10:03 AM  BMP  Glucose 70 - 99 mg/dL 518  96  84   BUN 8 - 27 mg/dL 13  16  14    Creatinine 0.57 - 1.00 mg/dL 8.41  6.60  6.30   BUN/Creat Ratio 12 - 28 13  18  17    Sodium 134 - 144 mmol/L 138  139  140   Potassium 3.5 - 5.2  mmol/L 4.2  4.6  4.3   Chloride 96 - 106 mmol/L 102  104  104   CO2 20 - 29 mmol/L 21  21  23    Calcium  8.7 - 10.3 mg/dL 9.2  9.2  8.8      ASSESSMENT AND PLAN SYNOPSIS 73 year old pleasant white female seen today for assessment for sleep apnea COPD in the setting of obesity and deconditioned state Patient with signs and symptoms of excessive daytime sleepiness with probable underlying diagnosis of obstructive sleep apnea in the setting of obesity and deconditioned state, patient with extensive smoking history highly suggestive of COPD   Recommend Sleep Study for definitve diagnosis Home sleep test to assess    Assessment of COPD Recommend  obtaining pulmonary function testing Plan to use albuterol  as needed However I feel that patient may maintenance inhaler therapy but patient wants to try albuterol  as needed first Avoid Allergens and Irritants Avoid secondhand smoke Avoid SICK contacts Recommend  Masking  when appropriate Recommend Keep up-to-date with vaccinations   Smoking Assessment and Cessation Counseling Upon further questioning, Patient smokes 1/2 PPD I have advised patient to quit/stop smoking as soon as possible due to high risk for multiple medical problems Patient  is NOT willing to quit smoking I have advised patient that we can assist and have options of Nicotine replacement therapy. I also advised patient on behavioral therapy and can provide oral medication therapy in conjunction with the other therapies Follow up next Office visit  for assessment of smoking cessation Smoking cessation counseling advised for >10 minutes  Obesity -recommend significant weight loss -recommend changing diet  Deconditioned state -Recommend increased daily activity and exercise   MEDICATION ADJUSTMENTS/LABS AND TESTS ORDERED:    CURRENT MEDICATIONS REVIEWED AT LENGTH WITH PATIENT TODAY   Patient  satisfied with Plan of action and management. All questions answered  Follow up  3 months   I spent a total of  65 minutes reviewing chart data, face-to-face evaluation with the patient, counseling and coordination of care as detailed above.    Lady Pier, M.D.  [facility]  Medical Director [facility] Medical Director [facility] Cardio-Pulmonary Department

## 2023-07-05 ENCOUNTER — Ambulatory Visit: Admitting: Cardiology

## 2023-07-12 ENCOUNTER — Encounter: Payer: Self-pay | Admitting: Family Medicine

## 2023-07-21 ENCOUNTER — Telehealth: Payer: Self-pay

## 2023-07-21 NOTE — Telephone Encounter (Signed)
 Copied from CRM [id number]. Topic: Clinical - Request for Lab/Test Order >> Jul 21, 2023  2:10 PM Dustin F wrote: Reason for CRM: Pt stated she is calling to schedule a lung cancer screening CT, however I did not see an order in the patient's chart. Pt is seeing Dr. Isaiah on 6/30 in [place] at 215pm. Pt already had the lung cancer screening line phone number [phone]. Pt's phone number is [phone].

## 2023-07-22 ENCOUNTER — Encounter: Payer: Self-pay | Admitting: Nurse Practitioner

## 2023-07-22 ENCOUNTER — Ambulatory Visit: Attending: Nurse Practitioner | Admitting: Nurse Practitioner

## 2023-07-22 VITALS — BP 94/68 | HR 95 | Ht 65.0 in | Wt 212.0 lb

## 2023-07-22 DIAGNOSIS — Z720 Tobacco use: Secondary | ICD-10-CM

## 2023-07-22 DIAGNOSIS — I340 Nonrheumatic mitral (valve) insufficiency: Secondary | ICD-10-CM | POA: Diagnosis not present

## 2023-07-22 DIAGNOSIS — I2510 Atherosclerotic heart disease of native coronary artery without angina pectoris: Secondary | ICD-10-CM

## 2023-07-22 DIAGNOSIS — I700 Atherosclerosis of aorta: Secondary | ICD-10-CM | POA: Diagnosis not present

## 2023-07-22 DIAGNOSIS — E785 Hyperlipidemia, unspecified: Secondary | ICD-10-CM | POA: Diagnosis not present

## 2023-07-22 DIAGNOSIS — R0609 Other forms of dyspnea: Secondary | ICD-10-CM | POA: Diagnosis not present

## 2023-07-22 NOTE — Progress Notes (Signed)
 Office Visit    Patient Name: Chelsey [name] Date of Encounter: 07/22/2023  Primary Care Provider:  Vicci Duwaine SQUIBB, DO Primary Cardiologist:  Redell Cave, MD  Chief Complaint    73 y.o. female with a history of breast cancer, hyperlipidemia, moderate mitral regurgitation, diastolic dysfunction, GERD, hiatal hernia, panic attacks, and DOE, who presents for f/u related to DOE.  Past Medical History   Subjective   Past Medical History:  Diagnosis Date   Aortic atherosclerosis (HCC)    a. Noted on CT abd 08/2019.   Breast cancer [facility]) 1997   right breast, radiation   Bronchitis    recent/ 06/09/15 had chest xray/ [facility]/resolved   Cancer [facility]) 1997   right lumpectomy,L/Ad/R    Coronary artery calcification seen on CT scan    a. 04/2023 MV: Minimal coronary calcifications noted on CT correction imaging of stress test.   Dyspnea on exertion    a. 03/2023 Echo: EF 60-65%, GrI DD, mod MR; b. 04/2023 MV: EF>65%, no isch/infarct, minimal cor Ca2+.   GERD (gastroesophageal reflux disease)    History of hiatal hernia    Hyperlipidemia    Low BP    a. typically 80's/60's   Moderate mitral regurgitation    a. 03/2023 Echo: EF 60-65%, no rwma, GrI DD, nl RV size/fxn, mod dil LA, mod MR, AoV sclerosis w/o stenosis.   Osteopenia    Panic attack    Paraesophageal hernia    Personal history of malignant neoplasm of breast    Personal history of radiation therapy    Past Surgical History:  Procedure Laterality Date   BICEPT TENODESIS Left 11/23/2016   Procedure: BICEPS TENODESIS;  Surgeon: Tobie Priest, MD;  Location: [facility];  Service: Orthopedics;  Laterality: Left;   BREAST BIOPSY Right 12/21/2021   MM RT RADIO FREQUENCY TAG LOC MAMMO GUIDE 12/21/2021 [facility]   BREAST EXCISIONAL BIOPSY Right 1997   pos   BREAST LUMPECTOMY Right 12/25/2021   breast cancer   BREAST SURGERY Right 1997   lumpectomy   CHOLECYSTECTOMY N/A 08/06/2016   Procedure:  LAPAROSCOPIC CHOLECYSTECTOMY WITH INTRAOPERATIVE CHOLANGIOGRAM;  Surgeon: Dellie Louanne KANDICE, MD;  Location: [facility];  Service: General;  Laterality: N/A;   COLONOSCOPY  2008   COLONOSCOPY WITH PROPOFOL  N/A 07/21/2015   Procedure: COLONOSCOPY WITH PROPOFOL ;  Surgeon: Rogelia Copping, MD;  Location: [facility];  Service: Endoscopy;  Laterality: N/A;   COLONOSCOPY WITH PROPOFOL  N/A 01/05/2018   Procedure: COLONOSCOPY WITH PROPOFOL ;  Surgeon: Therisa Bi, MD;  Location: [facility] END[facility]COPY;  Service: Gastroenterology;  Laterality: N/A;   COLONOSCOPY WITH PROPOFOL  N/A 03/23/2023   Procedure: COLONOSCOPY WITH PROPOFOL ;  Surgeon: Therisa Bi, MD;  Location: [facility] ENDOSCOPY;  Service: Gastroenterology;  Laterality: N/A;   ESOPHAGOGASTRODUODENOSCOPY (EGD) WITH PROPOFOL  N/A 06/16/2016   Procedure: ESOPHAGOGASTRODUODENOSCOPY (EGD) WITH PROPOFOL ;  Surgeon: Dellie Louanne KANDICE, MD;  Location: [facility];  Service: Endoscopy;  Laterality: N/A;   ESOPHAGOGASTRODUODENOSCOPY (EGD) WITH PROPOFOL  N/A 01/05/2018   Procedure: ESOPHAGOGASTRODUODENOSCOPY (EGD) WITH PROPOFOL ;  Surgeon: Therisa Bi, MD;  Location: [facility] ENDOSCOPY;  Service: Gastroenterology;  Laterality: N/A;   ESOPHAGOGASTRODUODENOSCOPY (EGD) WITH PROPOFOL  N/A 06/13/2018   Procedure: ESOPHAGOGASTRODUODENOSCOPY (EGD) WITH PROPOFOL ;  Surgeon: Therisa Bi, MD;  Location: [facility] ENDOSCOPY;  Service: Gastroenterology;  Laterality: N/A;   ESOPHAGOGASTRODUODENOSCOPY (EGD) WITH PROPOFOL  N/A 07/16/2018   Procedure: ESOPHAGOGASTRODUODENOSCOPY (EGD) WITH PROPOFOL ;  Surgeon: Copping Rogelia, MD;  Location: [facility] ENDOS[facility];  Service: Endoscopy;  Laterality: N/A;   HARDWARE REMOVAL Left 12/27/2016  Procedure: HARDWARE REMOVAL LEFT  PROXIMAL HUMEROUS;  Surgeon: Tobie Priest, MD;  Location: [facility];  Service: Orthopedics;  Laterality: Left;   IR GJ TUBE CHANGE  04/21/2018   NISSEN FUNDOPLICATION N/A 03/09/2018   Procedure: NISSEN FUNDOPLICATION;  Surgeon: Jordis Laneta FALCON, MD;  Location: [facility];  Service: General;  Laterality: N/A;   ORIF HUMERUS FRACTURE Left 11/23/2016   Procedure: OPEN REDUCTION INTERNAL FIXATION (ORIF) PROXIMAL HUMERUS FRACTURE;  Surgeon: Tobie Priest, MD;  Location: [facility];  Service: Orthopedics;  Laterality: Left;   PART MASTECTOMY,RADIO FREQUENCY LOCALIZER,AXILLARY SENTINEL NODE BIOPSY Right 12/25/2021   Procedure: PART MASTECTOMY,RADIO FREQUENCY LOCALIZER,AXILLARY SENTINEL NODE BIOPSY;  Surgeon: Tye Millet, DO;  Location: [facility];  Service: General;  Laterality: Right;   POLYPECTOMY  03/23/2023   Procedure: POLYPECTOMY;  Surgeon: Therisa Bi, MD;  Location: [facility] ENDOSCOPY;  Service: Gastroenterology;;   REPAIR OF ESOPHAGUS  03/09/2018   Procedure: REPAIR OF ESOPHAGUS;  Surgeon: Jordis Laneta FALCON, MD;  Location: [facility];  Service: General;;   REVERSE SHOULDER ARTHROPLASTY Left 12/27/2016   Procedure: REVERSE SHOULDER ARTHROPLASTY;  Surgeon: Tobie Priest, MD;  Location: [facility];  Service: Orthopedics;  Laterality: Left;   ROBOTIC ASSISTED LAPAROSCOPIC REPAIR OF PARAESOPHAGEAL HERNIA N/A 03/09/2018   Procedure: ROBOTIC HIATAL HERNIA CONVERTED TO OPEN;  Surgeon: Jordis Laneta FALCON, MD;  Location: [facility];  Service: General;  Laterality: N/A;    Allergies  No Known Allergies     History of Present Illness      73 y.o. y/o female with a history of breast cancer, hyperlipidemia, moderate mitral regurgitation, diastolic dysfunction, GERD, hiatal hernia, and panic attacks.  She was diagnosed with right breast cancer in 1997 and subsequently underwent lumpectomy and radiation followed by lumpectomy in December 2023 with repeat radiation and initiation of letrozole  therapy.  She has no prior cardiac history, though aortic atherosclerosis was noted on CT of the abdomen and pelvis in August 2021.    She established cardiology care in February 2025 in the setting of progressive decline in activity tolerance and worsening dyspnea on  exertion and fatigue.  She also reported occasional brief sensations of chest discomfort at rest.  Echocardiogram was performed showed normal LV function with grade 1 diastolic dysfunction and moderate mitral regurgitation.  This was followed by stress testing which showed normal LV function without evidence of ischemia or infarct.  She had minimal coronary calcifications on CT corrected imaging.     She was last seen in cardiology clinic in April 2025 at which time she continued have dyspnea on exertion.  Smoking cessation pulmonary referral were advised.  She saw pulmonology in May 2025 with plan for sleep study and PFTs, scheduled for next week.  She continues to smoke 3 cigarettes/day.  Unfortunately, she continues to experience dyspnea on exertion with minimal activity.  She does not experience chest pain.  She denies palpitations, PND, orthopnea, dizziness, syncope, edema, or early satiety.  She has an albuterol  inhaler but notes that she does not use it. Objective   Home Medications    Current Outpatient Medications  Medication Sig Dispense Refill   albuterol  (VENTOLIN  HFA) 108 (90 Base) MCG/ACT inhaler Inhale 2 puffs into the lungs every 4 (four) hours as needed for wheezing or shortness of breath. 8 g 2   aspirin  EC 81 MG tablet Take 1 tablet (81 mg total) by mouth daily. Swallow whole.     atorvastatin  (LIPITOR) 20 MG tablet Take 1 tablet (20 mg total) by mouth daily.  90 tablet 1   buPROPion  (WELLBUTRIN  XL) 300 MG 24 hr tablet One tab Once a day 90 tablet 1   busPIRone  (BUSPAR ) 10 MG tablet Take 1 tablet (10 mg total) by mouth 2 (two) times daily. 180 tablet 1   letrozole  (FEMARA ) 2.5 MG tablet TAKE 1 TABLET(2.5 MG) BY MOUTH DAILY 90 tablet 3   pantoprazole  (PROTONIX ) 40 MG tablet Take 1 tablet (40 mg total) by mouth daily. 90 tablet 3   trazodone  (DESYREL ) 300 MG tablet TAKE 1 TABLET(300 MG) BY MOUTH AT BEDTIME 90 tablet 0   Vitamin D , Ergocalciferol , (DRISDOL ) 1.25 MG (50000 UNIT) CAPS  capsule Take 1 capsule (50,000 Units total) by mouth every 7 (seven) days. 12 capsule 0   No current facility-administered medications for this visit.   Facility-Administered Medications Ordered in Other Visits  Medication Dose Route Frequency Provider Last Rate Last Admin   lidocaine  (PF) (XYLOCAINE ) 1 % injection 10 mL  10 mL Other Once Sakai, Isami, DO         Physical Exam    VS:  BP 94/68   Pulse 95   Ht 5' 5 (1.651 m)   Wt 212 lb (96.2 kg)   LMP  (LMP Unknown)   SpO2 97%   BMI 35.28 kg/m  , BMI Body mass index is 35.28 kg/m.          GEN: Well nourished, well developed, in no acute distress. HEENT: normal. Neck: Supple, no JVD, carotid bruits, or masses. Cardiac: RRR, no murmurs, rubs, or gallops. No clubbing, cyanosis, edema.  Radials 2+/PT 2+ and equal bilaterally.  Respiratory:  Respirations regular and unlabored, faint expiratory wheezing at the bases. GI: Soft, nontender, nondistended, BS + x 4. MS: no deformity or atrophy. Skin: warm and dry, no rash. Neuro:  Strength and sensation are intact. Psych: Normal affect.  Accessory Clinical Findings    Lab Results  Component Value Date   WBC 5.4 01/28/2023   HGB 13.4 01/28/2023   HCT 41.0 01/28/2023   MCV 92 01/28/2023   PLT 229 01/28/2023   Lab Results  Component Value Date   CREATININE 0.98 01/28/2023   BUN 13 01/28/2023   NA 138 01/28/2023   K 4.2 01/28/2023   CL 102 01/28/2023   CO2 21 01/28/2023   Lab Results  Component Value Date   ALT 13 01/28/2023   AST 13 01/28/2023   ALKPHOS 143 (H) 01/28/2023   BILITOT 0.5 01/28/2023   Lab Results  Component Value Date   CHOL 170 01/28/2023   HDL 46 01/28/2023   LDLCALC 101 (H) 01/28/2023   TRIG 129 01/28/2023   CHOLHDL 3.2 10/02/2019    Lab Results  Component Value Date   TSH 5.030 (H) 01/28/2023       Assessment & Plan    1.  Dyspnea on exertion: 1 year history of progressive dyspnea on exertion and fatigue in the setting of ongoing  tobacco abuse and obesity.  Recent echocardiogram showed normal LV function with grade 1 diastolic dysfunction and moderate left regurgitation.  Stress testing in April 2025 showed normal LV function without ischemia or infarct.  She had minimal coronary calcifications.  She has been seen by pulmonology with plan for PFTs next week.  She continues to experience dyspnea with minimal activity.  She is euvolemic on examination.  She does have some faint wheezing on exam.  She has albuterol  here which she does not use, and I suggested that she use it when she  is wheezing or dyspneic.  In the setting of ongoing tobacco abuse, I strongly encouraged her to quit smoking.  No further ischemic evaluation is warranted at this time.  2.  Aortic atherosclerosis/coronary calcium /hyperlipidemia: On atorvastatin  and aspirin  therapy.  3.  Ongoing tobacco abuse: Smoking 3 cigarettes a day, down from 4 cigarettes a day previously.  Smoking cessation advised.  Plan for PFTs pulmonology follow-up in place.  4.  Moderate mitral regurgitation: Noted on echocardiogram earlier this year.  I do not appreciate a murmur on examination.  Will plan for follow-up echo in 1 to 2 years.  5.  Disposition: Follow-up with pulmonology as planned.  Follow-up in cardiology clinic in 1 year or sooner if necessary.  Lonni Meager, NP 07/22/2023, 12:02 PM

## 2023-07-22 NOTE — Patient Instructions (Signed)
 Medication Instructions:  No changes *If you need a refill on your cardiac medications before your next appointment, please call your pharmacy*  Lab Work: None ordered If you have labs (blood work) drawn today and your tests are completely normal, you will receive your results only by: MyChart Message (if you have MyChart) OR A paper copy in the mail If you have any lab test that is abnormal or we need to change your treatment, we will call you to review the results.  Testing/Procedures: None ordered  Follow-Up: At [facility], you and your health needs are our priority.  As part of our continuing mission to provide you with exceptional heart care, our providers are all part of one team.  This team includes your primary Cardiologist (physician) and Advanced Practice Providers or APPs (Physician Assistants and Nurse Practitioners) who all work together to provide you with the care you need, when you need it.  Your next appointment:   12 month(s)  Provider:   You may see Constancia Delton, MD or one of the following Advanced Practice Providers on your designated Care Team:   Laneta Pintos, NP  We recommend signing up for the patient portal called "MyChart".  Sign up information is provided on this After Visit Summary.  MyChart is used to connect with patients for Virtual Visits (Telemedicine).  Patients are able to view lab/test results, encounter notes, upcoming appointments, etc.  Non-urgent messages can be sent to your provider as well.   To learn more about what you can do with MyChart, go to [email or web address].

## 2023-07-25 ENCOUNTER — Encounter: Payer: Self-pay | Admitting: Internal Medicine

## 2023-07-25 ENCOUNTER — Ambulatory Visit: Admitting: Internal Medicine

## 2023-07-25 VITALS — BP 90/60 | HR 98 | Temp 98.9°F | Ht 65.0 in | Wt 210.6 lb

## 2023-07-25 DIAGNOSIS — R0600 Dyspnea, unspecified: Secondary | ICD-10-CM

## 2023-07-25 DIAGNOSIS — J4540 Moderate persistent asthma, uncomplicated: Secondary | ICD-10-CM

## 2023-07-25 DIAGNOSIS — J449 Chronic obstructive pulmonary disease, unspecified: Secondary | ICD-10-CM

## 2023-07-25 LAB — PULMONARY FUNCTION TEST
DL/VA % pred: 91 %
DL/VA: 3.77 ml/min/mmHg/L
DLCO unc % pred: 91 %
DLCO unc: 18.01 ml/min/mmHg
FEF 25-75 Post: 1.84 L/s
FEF 25-75 Pre: 1.39 L/s
FEF2575-%Change-Post: 32 %
FEF2575-%Pred-Post: 98 %
FEF2575-%Pred-Pre: 74 %
FEV1-%Change-Post: 5 %
FEV1-%Pred-Post: 93 %
FEV1-%Pred-Pre: 88 %
FEV1-Post: 2.14 L
FEV1-Pre: 2.04 L
FEV1FVC-%Change-Post: 0 %
FEV1FVC-%Pred-Pre: 96 %
FEV6-%Change-Post: 5 %
FEV6-%Pred-Post: 99 %
FEV6-%Pred-Pre: 94 %
FEV6-Post: 2.89 L
FEV6-Pre: 2.74 L
FEV6FVC-%Change-Post: 0 %
FEV6FVC-%Pred-Post: 103 %
FEV6FVC-%Pred-Pre: 103 %
FVC-%Change-Post: 5 %
FVC-%Pred-Post: 96 %
FVC-%Pred-Pre: 91 %
FVC-Post: 2.94 L
FVC-Pre: 2.78 L
Post FEV1/FVC ratio: 73 %
Post FEV6/FVC ratio: 98 %
Pre FEV1/FVC ratio: 73 %
Pre FEV6/FVC Ratio: 99 %
RV % pred: 130 %
RV: 2.97 L
TLC % pred: 111 %
TLC: 5.82 L

## 2023-07-25 MED ORDER — BUDESONIDE-FORMOTEROL FUMARATE 160-4.5 MCG/ACT IN AERO: 2.0000 | INHALATION_SPRAY | Freq: Two times a day (BID) | RESPIRATORY_TRACT | 12 refills | Status: DC

## 2023-07-25 NOTE — Patient Instructions (Signed)
 Recommend starting Symbicort inhaler 2 puffs in the morning 2 puffs at night Recommend using albuterol  as needed Recommend obtaining CT chest to assess your breathing and lungs  Please stop smoking Follow-up lung cancer screening program when able  Avoid Allergens and Irritants Avoid secondhand smoke Avoid SICK contacts Recommend  Masking  when appropriate Recommend Keep up-to-date with vaccinations

## 2023-07-25 NOTE — Patient Instructions (Signed)
 Full PFT completed today ? ?

## 2023-07-25 NOTE — Progress Notes (Signed)
 Name: Chelsey [name] MRN: [id number] DOB: [birth year]     SYNOPSIS Referred to us  for SOB and assess for OSA Echo 03/2023 showed normal systolic function EF 60 to 65%, aortic sclerosis, impaired relaxation, no significant structural abnormalities to suggest etiology of shortness of breath. Referred to us  for further assessment Ambulating pulse oximetry in the office did not reveal oxygen Patient with extensive smoking history Patient with dyspnea exertion shortness of breath worsening Patient had previous history of pneumonia Patient with previous history of COVID Patient does not take any type of inhaler therapy Smokes half pack a day for the last 35 years   CHIEF COMPLAINT:  Follow-up assessment for shortness of breath Follow-up assessment for sleep apnea   HISTORY OF PRESENT ILLNESS: Assessment of DOE Patient with ongoing symptoms of shortness of breath and dyspnea on exertion Patient still smoking I have reviewed the breathing test today with her Findings suggest underlying reactive airways disease and asthma in the setting of obesity and deconditioned state  No exacerbation at this time No evidence of heart failure at this time No evidence or signs of infection at this time No respiratory distress No fevers, chills, nausea, vomiting, diarrhea No evidence of lower extremity edema No evidence hemoptysis  Pulmonary function testing reviewed in detail with patient today July 25, 2023 FEV1 FVC postbronchodilator ratio 73% predicted FEV1 93% percent predicted FVC 96% predicted No significant bronchodilator response TLC 111% predicted RV 130% predicted RV/TLC ratio 115% predicted DLCO 91% predicted Flow-volume loop somewhat suggests obstructive pattern in the expiratory limb  Patient unable to perform home sleep study due to the fact she did not want anything on her face I explained to her I am unable to assess if she has sleep apnea Patient will not tolerate  anything of her face including nasal cannula Therefore there is no point to test for sleep apnea if she has not been aware CPAP machine I have explained to her the risks and benefits of sleep apnea   PAST MEDICAL HISTORY :   has a past medical history of Aortic atherosclerosis (HCC), Breast cancer (HCC) (1997), Bronchitis, Cancer (HCC) (1997), Coronary artery calcification seen on CT scan, Dyspnea on exertion, GERD (gastroesophageal reflux disease), History of hiatal hernia, Hyperlipidemia, Low BP, Moderate mitral regurgitation, Osteopenia, Panic attack, Paraesophageal hernia, Personal history of malignant neoplasm of breast, and Personal history of radiation therapy.  has a past surgical history that includes Colonoscopy (2008); Breast surgery (Right, 1997); Colonoscopy with propofol  (N/A, 07/21/2015); Breast excisional biopsy (Right, 1997); Esophagogastroduodenoscopy (egd) with propofol  (N/A, 06/16/2016); Cholecystectomy (N/A, 08/06/2016); ORIF humerus fracture (Left, 11/23/2016); Bicept tenodesis (Left, 11/23/2016); Reverse shoulder arthroplasty (Left, 12/27/2016); Hardware Removal (Left, 12/27/2016); Colonoscopy with propofol  (N/A, 01/05/2018); Esophagogastroduodenoscopy (egd) with propofol  (N/A, 01/05/2018); Robotic assisted laparoscopic repair of paraesophageal hernia (N/A, 03/09/2018); Nissen fundoplication (N/A, 03/09/2018); Repair of esophagus (03/09/2018); IR GJ Tube Change (04/21/2018); Esophagogastroduodenoscopy (egd) with propofol  (N/A, 06/13/2018); Esophagogastroduodenoscopy (egd) with propofol  (N/A, 07/16/2018); Breast biopsy (Right, 12/21/2021); Part mastectomy,radio frequency localizer,axillary sentinel node biopsy (Right, 12/25/2021); Breast lumpectomy (Right, 12/25/2021); Colonoscopy with propofol  (N/A, 03/23/2023); and polypectomy (03/23/2023). Prior to Admission medications   Medication Sig Start Date End Date Taking? Authorizing Provider  aspirin  EC 81 MG tablet Take 1 tablet (81 mg  total) by mouth daily. Swallow whole. 05/09/23   Vivienne Lonni Ingle, NP  atorvastatin  (LIPITOR) 20 MG tablet Take 1 tablet (20 mg total) by mouth daily. 01/28/23   Johnson, Megan P, DO  buPROPion  (WELLBUTRIN  XL) 300 MG 24  hr tablet One tab Once a day 01/28/23   Vicci Bouchard P, DO  busPIRone  (BUSPAR ) 10 MG tablet Take 1 tablet (10 mg total) by mouth 2 (two) times daily. 01/28/23   Johnson, Megan P, DO  letrozole  (FEMARA ) 2.5 MG tablet TAKE 1 TABLET(2.5 MG) BY MOUTH DAILY 04/01/23   Jacobo Evalene PARAS, MD  pantoprazole  (PROTONIX ) 40 MG tablet Take 1 tablet (40 mg total) by mouth daily. 07/27/22   Johnson, Megan P, DO  trazodone  (DESYREL ) 300 MG tablet TAKE 1 TABLET(300 MG) BY MOUTH AT BEDTIME 04/28/23   Johnson, Megan P, DO  Vitamin D , Ergocalciferol , (DRISDOL ) 1.25 MG (50000 UNIT) CAPS capsule Take 1 capsule (50,000 Units total) by mouth every 7 (seven) days. 04/28/23   Vicci Bouchard P, DO   No Known Allergies  FAMILY HISTORY:  family history includes Alcohol abuse in her father; Anxiety disorder in her brother and brother; Breast cancer in her maternal grandmother; COPD in her brother; Cancer in her father and maternal grandmother; Depression in her brother; Heart attack in her father; Hypertension in her father; Kidney disease in her mother; Obesity in her brother. SOCIAL HISTORY:  reports that she has been smoking cigarettes. She started smoking about 13 years ago. She has a 2 pack-year smoking history. She has never used smokeless tobacco. She reports that she does not drink alcohol and does not use drugs.  BP 90/60 (BP Location: Right Arm, Patient Position: Sitting, Cuff Size: Large)   Pulse 98   Temp 98.9 F (37.2 C) (Oral)   Ht 5' 5 (1.651 m)   Wt 210 lb 9.6 oz (95.5 kg)   LMP  (LMP Unknown)   SpO2 96%   BMI 35.05 kg/m      Review of Systems: Gen:  Denies  fever, sweats, chills weight loss  HEENT: Denies blurred vision, double vision, ear pain, eye pain, hearing loss, nose bleeds,  sore throat Cardiac:  No dizziness, chest pain or heaviness, chest tightness,edema, No JVD Resp:   No cough, -sputum production, +shortness of breath,+wheezing, -hemoptysis,  Other:  All other systems negative   Physical Examination:   General Appearance: No distress  EYES PERRLA, EOM intact.   NECK Supple, No JVD Pulmonary: normal breath sounds, No wheezing.  CardiovascularNormal S1,S2.  No m/r/g.   Abdomen: Benign, Soft, non-tender. Neurology UE/LE 5/5 strength, no focal deficits Ext pulses intact, cap refill intact ALL OTHER ROS ARE NEGATIVE \    CBC    Component Value Date/Time   WBC 5.4 01/28/2023 0931   WBC 5.6 02/10/2022 1018   RBC 4.46 01/28/2023 0931   RBC 4.53 02/10/2022 1018   HGB 13.4 01/28/2023 0931   HCT 41.0 01/28/2023 0931   PLT 229 01/28/2023 0931   MCV 92 01/28/2023 0931   MCV 86 05/31/2013 0351   MCH 30.0 01/28/2023 0931   MCH 30.2 02/10/2022 1018   MCHC 32.7 01/28/2023 0931   MCHC 32.9 02/10/2022 1018   RDW 13.9 01/28/2023 0931   RDW 15.8 (H) 05/31/2013 0351   LYMPHSABS 1.3 01/28/2023 0931   MONOABS 0.7 04/10/2018 1705   EOSABS 0.1 01/28/2023 0931   BASOSABS 0.0 01/28/2023 0931      Latest Ref Rng & Units 01/28/2023    9:31 AM 07/27/2022   10:11 AM 01/26/2022   10:03 AM  BMP  Glucose 70 - 99 mg/dL 887  96  84   BUN 8 - 27 mg/dL 13  16  14    Creatinine 0.57 - 1.00 mg/dL  0.98  0.88  0.82   BUN/Creat Ratio 12 - 28 13  18  17    Sodium 134 - 144 mmol/L 138  139  140   Potassium 3.5 - 5.2 mmol/L 4.2  4.6  4.3   Chloride 96 - 106 mmol/L 102  104  104   CO2 20 - 29 mmol/L 21  21  23    Calcium  8.7 - 10.3 mg/dL 9.2  9.2  8.8      ASSESSMENT AND PLAN SYNOPSIS  73 year old pleasant white female seen today for assessment of underlying sleep apnea along with moderate reactive airways disease which is persistent due to her smoking and related to asthma in the setting of morbid obesity and deconditioned state  Assessment of shortness of  breath Pulmonary function test reviewed in detail Findings suggest air trapping hyperinflation with obstructive pattern on flow-volume loop likely suggest small obstructive airways disease and asthma Recommend starting Symbicort 2 puffs in the morning 2 puffs at night Rinse mouth after use Albuterol  as needed Avoid Allergens and Irritants Avoid secondhand smoke Avoid SICK contacts Recommend  Masking  when appropriate Recommend Keep up-to-date with vaccinations Pulmonary function testing reviewed in detail with patient today  Assessment of OSA Patient unable to perform HST due to equipment on her face Risks and benefits explained to patient regarding undiagnosed and on treated sleep apnea   Smoking Assessment and Cessation Counseling Upon further questioning, Patient smokes 1/2 ppd I have advised patient to quit/stop smoking as soon as possible due to high risk for multiple medical problems Patient is willing to quit smoking I have advised patient that we can assist and have options of Nicotine replacement therapy. I also advised patient on behavioral therapy and can provide oral medication therapy in conjunction with the other therapies Follow up next Office visit  for assessment of smoking cessation Smoking cessation counseling advised for >10 minutes  Obesity -recommend significant weight loss -recommend changing diet  Deconditioned state -Recommend increased daily activity and exercise  Lung cancer screening program not a candidate at this time since she is several years out from her breast cancer   MEDICATION ADJUSTMENTS/LABS AND TESTS ORDERED: Recommend starting Symbicort inhaler 2 puffs in the morning 2 puffs at night Recommend using albuterol  as needed Recommend obtaining CT chest to assess your breathing and lungs  Please stop smoking Follow-up lung cancer screening program when able Avoid Allergens and Irritants Avoid secondhand smoke Avoid SICK  contacts Recommend  Masking  when appropriate Recommend Keep up-to-date with vaccinations    CURRENT MEDICATIONS REVIEWED AT LENGTH WITH PATIENT TODAY   Patient  satisfied with Plan of action and management. All questions answered   Follow up 6 months   I spent a total of 48 minutes dedicated to the care of this patient on the date of this encounter to include pre-visit review of records, face-to-face time with the patient discussing conditions above, post visit ordering of testing, clinical documentation with the electronic health record, making appropriate referrals as documented, and communicating necessary information to the patient's healthcare team.    The Patient requires high complexity decision making for assessment and support, frequent evaluation and titration of therapies, application of advanced monitoring technologies and extensive interpretation of multiple databases.  Patient satisfied with Plan of action and management. All questions answered    Nickolas Alm Cellar, M.D.  [facility]  Medical Director [facility] Medical Director [facility] Cardio-Pulmonary Department

## 2023-07-25 NOTE — Progress Notes (Signed)
 Full PFT completed today ? ?

## 2023-07-27 ENCOUNTER — Ambulatory Visit (INDEPENDENT_AMBULATORY_CARE_PROVIDER_SITE_OTHER): Admitting: Family Medicine

## 2023-07-27 ENCOUNTER — Encounter: Payer: Self-pay | Admitting: Family Medicine

## 2023-07-27 VITALS — BP 97/63 | HR 83 | Temp 97.9°F | Ht 66.0 in | Wt 209.2 lb

## 2023-07-27 DIAGNOSIS — J449 Chronic obstructive pulmonary disease, unspecified: Secondary | ICD-10-CM | POA: Diagnosis not present

## 2023-07-27 DIAGNOSIS — E559 Vitamin D deficiency, unspecified: Secondary | ICD-10-CM

## 2023-07-27 DIAGNOSIS — R0609 Other forms of dyspnea: Secondary | ICD-10-CM | POA: Diagnosis not present

## 2023-07-27 DIAGNOSIS — J069 Acute upper respiratory infection, unspecified: Secondary | ICD-10-CM | POA: Diagnosis not present

## 2023-07-27 DIAGNOSIS — E785 Hyperlipidemia, unspecified: Secondary | ICD-10-CM | POA: Diagnosis not present

## 2023-07-27 DIAGNOSIS — F332 Major depressive disorder, recurrent severe without psychotic features: Secondary | ICD-10-CM | POA: Diagnosis not present

## 2023-07-27 LAB — POC COVID19/FLU A&B COMBO
Covid Antigen, POC: NEGATIVE
Influenza A Antigen, POC: NEGATIVE
Influenza B Antigen, POC: NEGATIVE

## 2023-07-27 MED ORDER — PANTOPRAZOLE SODIUM 40 MG PO TBEC: 40.0000 mg | DELAYED_RELEASE_TABLET | Freq: Every day | ORAL | 3 refills | Status: DC

## 2023-07-27 MED ORDER — TRAZODONE HCL 300 MG PO TABS: ORAL_TABLET | ORAL | 1 refills | Status: DC

## 2023-07-27 MED ORDER — BUPROPION HCL ER (XL) 300 MG PO TB24: ORAL_TABLET | ORAL | 1 refills | Status: DC

## 2023-07-27 MED ORDER — ATORVASTATIN CALCIUM 20 MG PO TABS: 20.0000 mg | ORAL_TABLET | Freq: Every day | ORAL | 1 refills | Status: DC

## 2023-07-27 MED ORDER — BUSPIRONE HCL 10 MG PO TABS: 10.0000 mg | ORAL_TABLET | Freq: Two times a day (BID) | ORAL | 1 refills | Status: AC

## 2023-07-27 NOTE — Progress Notes (Unsigned)
 BP 97/63 (BP Location: Left Arm, Patient Position: Sitting, Cuff Size: Normal)   Pulse 83   Temp 97.9 F (36.6 C) (Oral)   Ht 5' 6 (1.676 m)   Wt 209 lb 3.2 oz (94.9 kg)   LMP  (LMP Unknown)   SpO2 96%   BMI 33.77 kg/m    Subjective:    Patient ID: Chelsey [name], female    DOB: 1950/08/25, 73 y.o.   MRN: [id number]  HPI: Chelsey [name] is a 73 y.o. female  Chief Complaint  Patient presents with   Shortness of Breath    Patients states that the sob has gotten worse.    Headache   Cough    Onset Monday. OTC minimal relief     UPPER RESPIRATORY TRACT INFECTION Worst symptom: Fever: {Blank single:19197::yes,no} Cough: {Blank single:19197::yes,no} Shortness of breath: {Blank single:19197::yes,no} Wheezing: {Blank single:19197::yes,no} Chest pain: {Blank single:19197::yes,no,yes, with cough} Chest tightness: {Blank single:19197::yes,no} Chest congestion: {Blank single:19197::yes,no} Nasal congestion: {Blank single:19197::yes,no} Runny nose: {Blank single:19197::yes,no} Post nasal drip: {Blank single:19197::yes,no} Sneezing: {Blank single:19197::yes,no} Sore throat: {Blank single:19197::yes,no} Swollen glands: {Blank single:19197::yes,no} Sinus pressure: {Blank single:19197::yes,no} Headache: {Blank single:19197::yes,no} Face pain: {Blank single:19197::yes,no} Toothache: {Blank single:19197::yes,no} Ear pain: {Blank single:19197::yes,no} {Blank single:19197::right,left, bilateral} Ear pressure: {Blank single:19197::yes,no} {Blank single:19197::right,left, bilateral} Eyes red/itching:{Blank single:19197::yes,no} Eye drainage/crusting: {Blank single:19197::yes,no}  Vomiting: {Blank single:19197::yes,no} Rash: {Blank single:19197::yes,no} Fatigue: {Blank single:19197::yes,no} Sick contacts: {Blank single:19197::yes,no} Strep contacts: {Blank  single:19197::yes,no}  Context: {Blank multiple:19196::better,worse,stable,fluctuating} Recurrent sinusitis: {Blank single:19197::yes,no} Relief with OTC cold/cough medications: {Blank single:19197::yes,no}  Treatments attempted: {Blank multiple:19196::none,cold/sinus,mucinex,anti-histamine,pseudoephedrine,cough syrup,antibiotics}   HYPERLIPIDEMIA Hyperlipidemia status: {Blank single:19197::excellent compliance,good compliance,fair compliance,poor compliance} Satisfied with current treatment?  {Blank single:19197::yes,no} Side effects:  {Blank single:19197::yes,no} Medication compliance: {Blank single:19197::excellent compliance,good compliance,fair compliance,poor compliance} Past cholesterol meds: {Blank multiple:19196::none,atorvastain (lipitor),lovastatin (mevacor),pravastatin  (pravachol ),rosuvastatin (crestor),simvastatin  (zocor ),vytorin,fenofibrate (tricor),gemfibrozil,ezetimide (zetia),niaspan,lovaza} Supplements: {Blank multiple:19196::none,fish oil,niacin,red yeast rice} Aspirin :  {Blank single:19197::yes,no} The 10-year ASCVD risk score (Arnett DK, et al., 2019) is: 10.6%   Values used to calculate the score:     Age: 5 years     Clincally relevant sex: Female     Is Non-Hispanic African American: No     Diabetic: No     Tobacco smoker: Yes     Systolic Blood Pressure: 97 mmHg     Is BP treated: No     HDL Cholesterol: 46 mg/dL     Total Cholesterol: 170 mg/dL Chest pain:  {Blank dpwhoz:80802::bzd,wn} Coronary artery disease:  {Blank single:19197::yes,no} Family history CAD:  {Blank single:19197::yes,no} Family history early CAD:  {Blank single:19197::yes,no}   Relevant past medical, surgical, family and social history reviewed and updated as indicated. Interim medical history since our last visit reviewed. Allergies and medications reviewed and updated.  Review  of Systems  Per HPI unless specifically indicated above     Objective:    BP 97/63 (BP Location: Left Arm, Patient Position: Sitting, Cuff Size: Normal)   Pulse 83   Temp 97.9 F (36.6 C) (Oral)   Ht 5' 6 (1.676 m)   Wt 209 lb 3.2 oz (94.9 kg)   LMP  (LMP Unknown)   SpO2 96%   BMI 33.77 kg/m   Wt Readings from Last 3 Encounters:  07/27/23 209 lb 3.2 oz (94.9 kg)  07/25/23 210 lb 9.6 oz (95.5 kg)  07/25/23 210 lb 9.6 oz (95.5 kg)    Physical Exam  Results for orders placed or performed in visit on 07/25/23  Pulmonary function test   Collection Time: 07/25/23 12:45 PM  Result Value Ref Range  FVC-Pre 2.78 L   FVC-%Pred-Pre 91 %   FVC-Post 2.94 L   FVC-%Pred-Post 96 %   FVC-%Change-Post 5 %   FEV1-Pre 2.04 L   FEV1-%Pred-Pre 88 %   FEV1-Post 2.14 L   FEV1-%Pred-Post 93 %   FEV1-%Change-Post 5 %   FEV6-Pre 2.74 L   FEV6-%Pred-Pre 94 %   FEV6-Post 2.89 L   FEV6-%Pred-Post 99 %   FEV6-%Change-Post 5 %   Pre FEV1/FVC ratio 73 %   FEV1FVC-%Pred-Pre 96 %   Post FEV1/FVC ratio 73 %   FEV1FVC-%Change-Post 0 %   Pre FEV6/FVC Ratio 99 %   FEV6FVC-%Pred-Pre 103 %   Post FEV6/FVC ratio 98 %   FEV6FVC-%Pred-Post 103 %   FEV6FVC-%Change-Post 0 %   FEF 25-75 Pre 1.39 L/sec   FEF2575-%Pred-Pre 74 %   FEF 25-75 Post 1.84 L/sec   FEF2575-%Pred-Post 98 %   FEF2575-%Change-Post 32 %   RV 2.97 L   RV % pred 130 %   TLC 5.82 L   TLC % pred 111 %   DLCO unc 18.01 ml/min/mmHg   DLCO unc % pred 91 %   DL/VA 6.22 ml/min/mmHg/L   DL/VA % pred 91 %      Assessment & Plan:   Problem List Items Addressed This Visit       Other   Major depressive disorder, recurrent severe without psychotic features (HCC) - Primary   Hyperlipidemia   Vitamin D  deficiency     Follow up plan: No follow-ups on file.

## 2023-07-28 ENCOUNTER — Telehealth: Payer: Self-pay

## 2023-07-28 ENCOUNTER — Encounter: Payer: Self-pay | Admitting: Family Medicine

## 2023-07-28 ENCOUNTER — Ambulatory Visit: Payer: Self-pay | Admitting: Family Medicine

## 2023-07-28 DIAGNOSIS — J449 Chronic obstructive pulmonary disease, unspecified: Secondary | ICD-10-CM | POA: Insufficient documentation

## 2023-07-28 LAB — COMPREHENSIVE METABOLIC PANEL WITH GFR
ALT: 27 IU/L (ref 0–32)
AST: 26 IU/L (ref 0–40)
Albumin: 4 g/dL (ref 3.8–4.8)
Alkaline Phosphatase: 147 IU/L — ABNORMAL HIGH (ref 44–121)
BUN/Creatinine Ratio: 12 (ref 12–28)
BUN: 16 mg/dL (ref 8–27)
Bilirubin Total: 0.8 mg/dL (ref 0.0–1.2)
CO2: 18 mmol/L — ABNORMAL LOW (ref 20–29)
Calcium: 9.3 mg/dL (ref 8.7–10.3)
Chloride: 102 mmol/L (ref 96–106)
Creatinine, Ser: 1.33 mg/dL — ABNORMAL HIGH (ref 0.57–1.00)
Globulin, Total: 2.7 g/dL (ref 1.5–4.5)
Glucose: 109 mg/dL — ABNORMAL HIGH (ref 70–99)
Potassium: 4.4 mmol/L (ref 3.5–5.2)
Sodium: 139 mmol/L (ref 134–144)
Total Protein: 6.7 g/dL (ref 6.0–8.5)
eGFR: 43 mL/min/{1.73_m2} — ABNORMAL LOW (ref >59)

## 2023-07-28 LAB — LIPID PANEL W/O CHOL/HDL RATIO
Cholesterol, Total: 170 mg/dL (ref 100–199)
HDL: 46 mg/dL (ref >39)
LDL Chol Calc (NIH): 101 mg/dL — ABNORMAL HIGH (ref 0–99)
Triglycerides: 129 mg/dL (ref 0–149)
VLDL Cholesterol Cal: 23 mg/dL (ref 5–40)

## 2023-07-28 LAB — VITAMIN D 25 HYDROXY (VIT D DEFICIENCY, FRACTURES): Vit D, 25-Hydroxy: 56 ng/mL (ref 30.0–100.0)

## 2023-07-28 NOTE — Progress Notes (Signed)
 Care Guide Pharmacy Note  07/28/2023 Name: Chelsey [name] MRN: [id number] DOB: [birth year]  Referred By: Vicci Duwaine SQUIBB, DO Reason for referral: Complex Care Management (Outreach to schedule with Pharm d )   Chelsey [name] is a 73 y.o. year old female who is a primary care patient of Vicci Duwaine SQUIBB, DO.  Chelsey [name] was referred to the pharmacist for assistance related to: COPD  An unsuccessful telephone outreach was attempted today to contact the patient who was referred to the pharmacy team for assistance with medication assistance. Additional attempts will be made to contact the patient.  Jeoffrey Buffalo , RMA     [facility]  [facility], [facility] Guide  Direct Dial: [phone]  Website: [email or web address]

## 2023-07-28 NOTE — Assessment & Plan Note (Signed)
 Has not been able to get her symbicort due to cost. Sample of breztri  given today and referral to pharmacy for help with cost placed. Continue to follow with pulmonology. Await CT scheduled for Monday. Call with any concerns.

## 2023-07-28 NOTE — Assessment & Plan Note (Signed)
 Under good control on current regimen. Continue current regimen. Continue to monitor. Call with any concerns. Refills given. Labs drawn today.

## 2023-07-28 NOTE — Assessment & Plan Note (Signed)
 Under good control on current regimen. Continue current regimen. Continue to monitor. Call with any concerns. Refills given.

## 2023-07-28 NOTE — Assessment & Plan Note (Signed)
 Rechecking labs today. Await results. Treat as needed.

## 2023-08-01 ENCOUNTER — Ambulatory Visit
Admission: RE | Admit: 2023-08-01 | Discharge: 2023-08-01 | Disposition: A | Discharge status: Home / Self Care | Source: Ambulatory Visit | Attending: Internal Medicine | Admitting: Internal Medicine

## 2023-08-01 DIAGNOSIS — R0602 Shortness of breath: Secondary | ICD-10-CM | POA: Diagnosis not present

## 2023-08-01 DIAGNOSIS — R918 Other nonspecific abnormal finding of lung field: Secondary | ICD-10-CM | POA: Diagnosis not present

## 2023-08-01 DIAGNOSIS — K449 Diaphragmatic hernia without obstruction or gangrene: Secondary | ICD-10-CM | POA: Diagnosis not present

## 2023-08-01 DIAGNOSIS — R0600 Dyspnea, unspecified: Secondary | ICD-10-CM | POA: Insufficient documentation

## 2023-08-01 NOTE — Progress Notes (Signed)
 Letter printed and mailed.

## 2023-08-02 ENCOUNTER — Ambulatory Visit: Admitting: Family Medicine

## 2023-08-03 NOTE — Progress Notes (Signed)
 Care Guide Pharmacy Note  08/03/2023 Name: Chelsey [name] MRN: [id number] DOB: 1950/08/18  Referred By: Vicci Duwaine SQUIBB, DO Reason for referral: Complex Care Management (Outreach to schedule with Pharm d )   Chelsey [name] is a 73 y.o. year old female who is a primary care patient of Vicci Duwaine SQUIBB, DO.  KINDLE STROHMEIER was referred to the pharmacist for assistance related to: COPD  Successful contact was made with the patient to discuss pharmacy services including being ready for the pharmacist to call at least 5 minutes before the scheduled appointment time and to have medication bottles and any blood pressure readings ready for review. The patient agreed to meet with the pharmacist via telephone visit on (date/time).08/12/2023  Jeoffrey Buffalo , RMA     [place]  [facility], [facility] Guide  Direct Dial: [phone]  Website: [email or web address]

## 2023-08-12 ENCOUNTER — Other Ambulatory Visit (HOSPITAL_COMMUNITY): Payer: Self-pay

## 2023-08-12 ENCOUNTER — Other Ambulatory Visit: Payer: Self-pay

## 2023-08-12 NOTE — Progress Notes (Signed)
 08/12/2023 Name: Chelsey [name] MRN: [id number] DOB: [birth year]  Chief Complaint  Patient presents with   Medication Assistance   Chelsey [name] is a 73 y.o. year old female who presented for a telephone visit.   They were referred to the pharmacist by their PCP for assistance in managing medication access.   Subjective:  Care Team: Primary Care Provider: Vicci Duwaine SQUIBB, DO ; Next Scheduled Visit: 8/1  Medication Access/Adherence  Current Pharmacy:  [id number] - [place] - [address] AT [facility] [address] [place] [address] Phone: [phone] Fax: [phone]  -Patient reports affordability concerns with their medications: Yes  -Patient reports access/transportation concerns to their pharmacy: No  -Patient reports adherence concerns with their medications:  Yes    COPD: Current medications: Breztri  (sample) -Medications tried in the past: Prescribed Symbicort  by pulmonology, but medication was not affordable for patient -Reports no exacerbations in the past year -Patient was provided Breztri  inhaler at 7/3 visit with Dr. Vicci, but she states she is not using daily; because she cannot tell a difference when she uses it.  Objective:  Lab Results  Component Value Date   CREATININE 1.33 (H) 07/27/2023   BUN 16 07/27/2023   NA 139 07/27/2023   K 4.4 07/27/2023   CL 102 07/27/2023   CO2 18 (L) 07/27/2023   Medications Reviewed Today     Reviewed by Deanna Channing LABOR, RPH (Pharmacist) on 08/12/23 at 1043  Med List Status: <None>   Medication Order Taking? Sig Documenting Provider Last Dose Status Informant  albuterol  (VENTOLIN  HFA) 108 (90 Base) MCG/ACT inhaler [id number]  Inhale 2 puffs into the lungs every 4 (four) hours as needed for wheezing or shortness of breath. Kasa, Kurian, MD  Active   aspirin  EC 81 MG tablet [id number]  Take 1 tablet (81 mg total) by mouth daily. Swallow whole. Vivienne Lonni Ingle, NP  Active   atorvastatin  (LIPITOR) 20 MG tablet [id number]  Take 1 tablet (20 mg total) by mouth daily. Johnson, Megan P, DO  Active   budesonide -formoterol  (SYMBICORT ) 160-4.5 MCG/ACT inhaler [id number]  Inhale 2 puffs into the lungs 2 (two) times daily.  Patient not taking: Reported on 08/12/2023   Kasa, Kurian, MD  Active   budesonide -glycopyrrolate -formoterol  (BREZTRI  AEROSPHERE) 160-9-4.8 MCG/ACT AERO inhaler [id number] Yes Inhale 2 puffs into the lungs 2 (two) times daily. [provider]  Active   buPROPion  (WELLBUTRIN  XL) 300 MG 24 hr tablet [id number]  One tab Once a day Johnson, Megan P, DO  Active   busPIRone  (BUSPAR ) 10 MG tablet [id number]  Take 1 tablet (10 mg total) by mouth 2 (two) times daily. Johnson, Megan P, DO  Active   letrozole  (FEMARA ) 2.5 MG tablet [id number]  TAKE 1 TABLET(2.5 MG) BY MOUTH DAILY Finnegan, Timothy J, MD  Active   lidocaine  (PF) (XYLOCAINE ) 1 % injection 10 mL [id number]   Sakai, Isami, DO  Active   pantoprazole  (PROTONIX ) 40 MG tablet [id number]  Take 1 tablet (40 mg total) by mouth daily. Vicci, Megan P, DO  Active   trazodone  (DESYREL ) 300 MG tablet [id number]  TAKE 1 TABLET(300 MG) BY MOUTH AT BEDTIME Johnson, Megan P, DO  Active   Vitamin D , Ergocalciferol , (DRISDOL ) 1.25 MG (50000 UNIT) CAPS capsule [id number]  Take 1 capsule (50,000 Units total) by mouth every 7 (seven) days. Vicci Duwaine P, DO  Active  Assessment/Plan:   COPD: -Currently uncontrolled.  -Assessed patient's eligibility for PAP to assist with inhaler affordability, and she does not qualify based on HHI. -Attempted to call [facility] to check on more affordable alternatives to Symbicort , but representative could not provide information since patient was not also on the line.  Per [employer] [facility] Medicare D formulary, Wixela or fluticasone/salmeterol (generic Advair) are preferred and Tier 1 on insurance formulary.  Contacting prior auth team to see if a test claim can  be ran on patient's plan to verify cost and copay -Educated patient that maintenance inhalers will not provide immediate results, but used daily as prescribed, they help prevent exacerbations of COPD and need for frequent rescue inhaler or nebs use  Follow Up Plan: Will follow-up with PCP and patient once coverage/cost of generic Advair is known  Channing DELENA Mealing, PharmD, DPLA

## 2023-08-12 NOTE — Progress Notes (Signed)
   08/12/2023  Patient ID: Chelsey [name], female   DOB: [birth year], 73 y.o.   MRN: [id number]  Test claims for both Wixela and fluticasone/salmeterol 250/50 reflect $91 copays due to deductible that has not yet been met.  Patient would need to call plan to inquire about deductible amount remaining; and she could also inquire about prescription payment plan to spread deductible amount out in monthly installments, which would make medication copays $0.    Contacted patient to provide this information and the number for member services [phone]).  I will follow-up with her in 1 week see what information she was able to obtain.  Channing DELENA Mealing, PharmD, DPLA

## 2023-08-19 ENCOUNTER — Other Ambulatory Visit: Payer: Self-pay

## 2023-08-19 NOTE — Progress Notes (Unsigned)
   08/19/2023  Patient ID: Chelsey [name], female   DOB: 1950-06-19, 73 y.o.   MRN: [id number]  Outreach attempt to follow-up on access/affordability of maintenance inhaler.  I was not able to reach the patient but did leave a HIPAA compliant voicemail with my direct phone number.  Channing DELENA Mealing, PharmD, DPLA

## 2023-08-26 ENCOUNTER — Encounter: Payer: Self-pay | Admitting: Family Medicine

## 2023-08-26 ENCOUNTER — Ambulatory Visit (INDEPENDENT_AMBULATORY_CARE_PROVIDER_SITE_OTHER): Admitting: Family Medicine

## 2023-08-26 VITALS — BP 125/87 | HR 87 | Temp 97.6°F | Ht 66.0 in | Wt 214.4 lb

## 2023-08-26 DIAGNOSIS — J449 Chronic obstructive pulmonary disease, unspecified: Secondary | ICD-10-CM

## 2023-08-26 DIAGNOSIS — B351 Tinea unguium: Secondary | ICD-10-CM | POA: Diagnosis not present

## 2023-08-26 DIAGNOSIS — R0609 Other forms of dyspnea: Secondary | ICD-10-CM | POA: Diagnosis not present

## 2023-08-26 MED ORDER — SPACER/AERO-HOLD CHAMBER MASK MISC: 0 refills | Status: AC

## 2023-08-26 NOTE — Progress Notes (Signed)
 BP 125/87   Pulse 87   Temp 97.6 F (36.4 C) (Oral)   Ht 5' 6 (1.676 m)   Wt 214 lb 6.4 oz (97.3 kg)   LMP  (LMP Unknown)   SpO2 96%   BMI 34.61 kg/m    Subjective:    Patient ID: Chelsey [name], female    DOB: 26-Jul-1950, 73 y.o.   MRN: [id number]  HPI: Chelsey [name] is a 73 y.o. female  Chief Complaint  Patient presents with   COPD    Go over CT results    Fatigue   Shortness of Breath    Dyspnea on exertion    SHORTNESS OF BREATH Duration: chronic Onset: gradual Description of breathing discomfort: short of breath and fatigued Severity: moderate Episode duration: constant Frequency: constant Related to exertion: yes Cough: yes Chest tightness: yes Wheezing: yes Fevers: no Chest pain: no Palpitations: no  Nausea: no Diaphoresis: no Deconditioning: yes Status: stable  Relevant past medical, surgical, family and social history reviewed and updated as indicated. Interim medical history since our last visit reviewed. Allergies and medications reviewed and updated.  Review of Systems  Constitutional: Negative.   Respiratory:  Positive for cough and shortness of breath. Negative for apnea, choking, chest tightness, wheezing and stridor.   Cardiovascular: Negative.   Musculoskeletal: Negative.   Neurological: Negative.   Psychiatric/Behavioral: Negative.      Per HPI unless specifically indicated above     Objective:    BP 125/87   Pulse 87   Temp 97.6 F (36.4 C) (Oral)   Ht 5' 6 (1.676 m)   Wt 214 lb 6.4 oz (97.3 kg)   LMP  (LMP Unknown)   SpO2 96%   BMI 34.61 kg/m   Wt Readings from Last 3 Encounters:  08/26/23 214 lb 6.4 oz (97.3 kg)  07/27/23 209 lb 3.2 oz (94.9 kg)  07/25/23 210 lb 9.6 oz (95.5 kg)    Physical Exam Vitals and nursing note reviewed.  Constitutional:      General: She is not in acute distress.    Appearance: Normal appearance. She is well-developed. She is obese. She is not ill-appearing, toxic-appearing or  diaphoretic.  HENT:     Head: Normocephalic and atraumatic.     Right Ear: External ear normal.     Left Ear: External ear normal.     Nose: Nose normal.     Mouth/Throat:     Mouth: Mucous membranes are moist.     Pharynx: Oropharynx is clear.  Eyes:     General: No scleral icterus.       Right eye: No discharge.        Left eye: No discharge.     Extraocular Movements: Extraocular movements intact.     Conjunctiva/sclera: Conjunctivae normal.     Pupils: Pupils are equal, round, and reactive to light.  Cardiovascular:     Rate and Rhythm: Normal rate and regular rhythm.     Pulses: Normal pulses.     Heart sounds: Normal heart sounds. No murmur heard.    No friction rub. No gallop.  Pulmonary:     Effort: Pulmonary effort is normal. No respiratory distress.     Breath sounds: Normal breath sounds. No stridor. No wheezing, rhonchi or rales.  Chest:     Chest wall: No tenderness.  Musculoskeletal:        General: Normal range of motion.     Cervical back: Normal range of motion and neck  supple.  Skin:    General: Skin is warm and dry.     Capillary Refill: Capillary refill takes less than 2 seconds.     Coloration: Skin is not jaundiced or pale.     Findings: No bruising, erythema, lesion or rash.  Neurological:     General: No focal deficit present.     Mental Status: She is alert and oriented to person, place, and time. Mental status is at baseline.  Psychiatric:        Mood and Affect: Mood normal.        Behavior: Behavior normal.        Thought Content: Thought content normal.        Judgment: Judgment normal.     Results for orders placed or performed in visit on 07/27/23  Lipid Panel w/o Chol/HDL Ratio   Collection Time: 07/27/23 11:49 AM  Result Value Ref Range   Cholesterol, Total 170 100 - 199 mg/dL   Triglycerides 870 0 - 149 mg/dL   HDL 46 >60 mg/dL   VLDL Cholesterol Cal 23 5 - 40 mg/dL   LDL Chol Calc (NIH) 898 (H) 0 - 99 mg/dL  Comprehensive  metabolic panel with GFR   Collection Time: 07/27/23 11:49 AM  Result Value Ref Range   Glucose 109 (H) 70 - 99 mg/dL   BUN 16 8 - 27 mg/dL   Creatinine, Ser 8.66 (H) 0.57 - 1.00 mg/dL   eGFR 43 (L) >40 fO/fpw/8.26   BUN/Creatinine Ratio 12 12 - 28   Sodium 139 134 - 144 mmol/L   Potassium 4.4 3.5 - 5.2 mmol/L   Chloride 102 96 - 106 mmol/L   CO2 18 (L) 20 - 29 mmol/L   Calcium  9.3 8.7 - 10.3 mg/dL   Total Protein 6.7 6.0 - 8.5 g/dL   Albumin  4.0 3.8 - 4.8 g/dL   Globulin, Total 2.7 1.5 - 4.5 g/dL   Bilirubin Total 0.8 0.0 - 1.2 mg/dL   Alkaline Phosphatase 147 (H) 44 - 121 IU/L   AST 26 0 - 40 IU/L   ALT 27 0 - 32 IU/L  VITAMIN D  25 Hydroxy (Vit-D Deficiency, Fractures)   Collection Time: 07/27/23 11:49 AM  Result Value Ref Range   Vit D, 25-Hydroxy 56.0 30.0 - 100.0 ng/mL  POC Covid19/Flu A&B Antigen   Collection Time: 07/27/23 11:57 AM  Result Value Ref Range   Influenza A Antigen, POC Negative Negative   Influenza B Antigen, POC Negative Negative   Covid Antigen, POC Negative Negative      Assessment & Plan:   Problem List Items Addressed This Visit       Respiratory   Chronic obstructive pulmonary disease (HCC) - Primary   Unsure if she's using her breztri  properly. Will get her a spacer. Continue to follow with pharmacy. Will get her in for pulmonary rehab- order placed today. Continue to monitor closely. Recheck 1 month.       Relevant Medications   Spacer/Aero-Hold Chamber Mask MISC   Other Relevant Orders   AMB referral to pulmonary rehabilitation   Other Visit Diagnoses       DOE (dyspnea on exertion)       Cardiac work up negative. Pulmonary work up suggests deconditioning. Will get her in for pulmonary rehab. Call with any concerns.   Relevant Medications   Spacer/Aero-Hold Chamber Mask MISC   Other Relevant Orders   AMB referral to pulmonary rehabilitation     Onychomycosis  Referral to podiatry placed today. Call with any conerns.   Relevant  Orders   Ambulatory referral to Podiatry        Follow up plan: Return in about 4 weeks (around 09/23/2023).

## 2023-08-26 NOTE — Assessment & Plan Note (Signed)
 Unsure if she's using her breztri  properly. Will get her a spacer. Continue to follow with pharmacy. Will get her in for pulmonary rehab- order placed today. Continue to monitor closely. Recheck 1 month.

## 2023-09-06 ENCOUNTER — Ambulatory Visit: Admitting: Podiatry

## 2023-09-06 DIAGNOSIS — M79675 Pain in left toe(s): Secondary | ICD-10-CM

## 2023-09-06 DIAGNOSIS — B351 Tinea unguium: Secondary | ICD-10-CM | POA: Diagnosis not present

## 2023-09-06 DIAGNOSIS — M79674 Pain in right toe(s): Secondary | ICD-10-CM

## 2023-09-06 NOTE — Progress Notes (Signed)
  Subjective:  Patient ID: Chelsey [name], female    DOB: 08-23-1950,  MRN: [id number]  Chief Complaint  Patient presents with   Nail Problem    Nail trim   73 y.o. female returns for the above complaint.  Patient presents with thickened elongated dystrophic mycotic toenails x 10 mild pain on palpation arch with ambulation worse with pressure has not seen MRIs prior to seeing me denies any other acute complaints.  Objective:  There were no vitals filed for this visit. Podiatric Exam: Vascular: dorsalis pedis and posterior tibial pulses are palpable bilateral. Capillary return is immediate. Temperature gradient is WNL. Skin turgor WNL  Sensorium: Normal Semmes Weinstein monofilament test. Normal tactile sensation bilaterally. Nail Exam: Pt has thick disfigured discolored nails with subungual debris noted bilateral entire nail hallux through fifth toenails.  Pain on palpation to the nails. Ulcer Exam: There is no evidence of ulcer or pre-ulcerative changes or infection. Orthopedic Exam: Muscle tone and strength are WNL. No limitations in general ROM. No crepitus or effusions noted.  Skin: No Porokeratosis. No infection or ulcers    Assessment & Plan:   1. Pain due to onychomycosis of toenails of both feet     Patient was evaluated and treated and all questions answered.  Onychomycosis with pain  -Nails palliatively debrided as below. -Educated on self-care  Procedure: Nail Debridement Rationale: pain  Type of Debridement: manual, sharp debridement. Instrumentation: Nail nipper, rotary burr. Number of Nails: 10  Procedures and Treatment: Consent by patient was obtained for treatment procedures. The patient understood the discussion of treatment and procedures well. All questions were answered thoroughly reviewed. Debridement of mycotic and hypertrophic toenails, 1 through 5 bilateral and clearing of subungual debris. No ulceration, no infection noted.  Return Visit-Office  Procedure: Patient instructed to return to the office for a follow up visit 3 months for continued evaluation and treatment.  Franky Blanch, DPM    Return in about 3 months (around 12/07/2023) for RFC.

## 2023-09-09 ENCOUNTER — Ambulatory Visit: Admitting: Family Medicine

## 2023-09-19 ENCOUNTER — Other Ambulatory Visit: Payer: Self-pay | Admitting: Family Medicine

## 2023-09-19 NOTE — Progress Notes (Signed)
   09/19/2023  Patient ID: Chelsey [name], female   DOB: [birth year], 73 y.o.   MRN: [id number]  Pharmacy Quality Measure Review  This patient is appearing on a report for being at risk of failing the adherence measure for cholesterol (statin) medications this calendar year.   Medication: Atorvastatin  20mg . Last filled 04/29/2023 90 DS, fail date 10/07/2023 Message sent to clinical pharmacist covering for [place]. Excel sheet updated.   Lang Sieve, PharmD, BCGP Clinical Pharmacist  [phone]

## 2023-09-20 NOTE — Telephone Encounter (Signed)
 Requested Prescriptions  Refused Prescriptions Disp Refills   atorvastatin  (LIPITOR) 20 MG tablet [Pharmacy Med Name: ATORVASTATIN  20MG  TABLETS] 90 tablet 1    Sig: TAKE 1 TABLET(20 MG) BY MOUTH DAILY     Cardiovascular:  Antilipid - Statins Failed - 09/20/2023  5:58 PM      Failed - Lipid Panel in normal range within the last 12 months    Cholesterol, Total  Date Value Ref Range Status  07/27/2023 170 100 - 199 mg/dL Final   Cholesterol Piccolo, Waived  Date Value Ref Range Status  01/05/2016 177 <200 mg/dL Final    Comment:                            Desirable                <200                         Borderline High      200- 239                         High                     >239    LDL Chol Calc (NIH)  Date Value Ref Range Status  07/27/2023 101 (H) 0 - 99 mg/dL Final   HDL  Date Value Ref Range Status  07/27/2023 46 >39 mg/dL Final   Triglycerides  Date Value Ref Range Status  07/27/2023 129 0 - 149 mg/dL Final   Triglycerides Piccolo,Waived  Date Value Ref Range Status  01/05/2016 73 <150 mg/dL Final    Comment:                            Normal                   <150                         Borderline High     150 - 199                         High                200 - 499                         Very High                >499          Passed - Patient is not pregnant      Passed - Valid encounter within last 12 months    Recent Outpatient Visits           3 weeks ago Chronic obstructive pulmonary disease, unspecified COPD type (HCC)   [place] [facility] [place], Megan P, DO   1 month ago DOE (dyspnea on exertion)   [place] [facility] [place], Megan P, DO   4 months ago DOE (dyspnea on exertion)   [place] [facility] [place], Megan P, DO   6 months ago DOE (dyspnea on exertion)    [facility] [place], [place], DO

## 2023-09-27 ENCOUNTER — Other Ambulatory Visit: Payer: Self-pay | Admitting: Internal Medicine

## 2023-09-27 ENCOUNTER — Ambulatory Visit: Payer: Self-pay | Admitting: Internal Medicine

## 2023-09-27 DIAGNOSIS — R918 Other nonspecific abnormal finding of lung field: Secondary | ICD-10-CM

## 2023-09-27 NOTE — Progress Notes (Signed)
 RUL apical Nodule, follow up CT chest in 3 months

## 2023-10-16 ENCOUNTER — Other Ambulatory Visit: Payer: Self-pay | Admitting: Family Medicine

## 2023-10-18 ENCOUNTER — Encounter: Payer: Self-pay | Admitting: Family Medicine

## 2023-10-18 ENCOUNTER — Ambulatory Visit (INDEPENDENT_AMBULATORY_CARE_PROVIDER_SITE_OTHER): Admitting: Family Medicine

## 2023-10-18 VITALS — BP 111/73 | HR 90 | Ht 66.0 in | Wt 213.0 lb

## 2023-10-18 DIAGNOSIS — M25551 Pain in right hip: Secondary | ICD-10-CM | POA: Diagnosis not present

## 2023-10-18 DIAGNOSIS — R0609 Other forms of dyspnea: Secondary | ICD-10-CM | POA: Diagnosis not present

## 2023-10-18 DIAGNOSIS — Z23 Encounter for immunization: Secondary | ICD-10-CM | POA: Diagnosis not present

## 2023-10-18 DIAGNOSIS — M25562 Pain in left knee: Secondary | ICD-10-CM

## 2023-10-18 DIAGNOSIS — G8929 Other chronic pain: Secondary | ICD-10-CM

## 2023-10-18 DIAGNOSIS — M25561 Pain in right knee: Secondary | ICD-10-CM

## 2023-10-18 MED ORDER — DICLOFENAC SODIUM 1 % EX GEL: 4.0000 g | Freq: Four times a day (QID) | CUTANEOUS | 3 refills | Status: AC

## 2023-10-18 NOTE — Telephone Encounter (Signed)
 Requested medication (s) are due for refill today: yes  Requested medication (s) are on the active medication list: yes  Last refill:  04/28/23 #12 tab  Future visit scheduled: yes  Notes to clinic:  med not delegated to NT to RF   Requested Prescriptions  Pending Prescriptions Disp Refills   Vitamin D , Ergocalciferol , (DRISDOL ) 1.25 MG (50000 UNIT) CAPS capsule [Pharmacy Med Name: VITAMIN D2 50,000IU (ERGO) CAP RX] 12 capsule 0    Sig: TAKE 1 CAPSULE BY MOUTH EVERY 7 DAYS     Endocrinology:  Vitamins - Vitamin D  Supplementation 2 Failed - 10/18/2023  7:41 AM      Failed - Manual Review: Route requests for 50,000 IU strength to the provider      Passed - Ca in normal range and within 360 days    Calcium   Date Value Ref Range Status  07/27/2023 9.3 8.7 - 10.3 mg/dL Final   Calcium , Total  Date Value Ref Range Status  05/31/2013 8.9 8.5 - 10.1 mg/dL Final         Passed - Vitamin D  in normal range and within 360 days    Vit D, 25-Hydroxy  Date Value Ref Range Status  07/27/2023 56.0 30.0 - 100.0 ng/mL Final    Comment:    Vitamin D  deficiency has been defined by the Institute of Medicine and an Endocrine Society practice guideline as a level of serum 25-OH vitamin D  less than 20 ng/mL (1,2). The Endocrine Society went on to further define vitamin D  insufficiency as a level between 21 and 29 ng/mL (2). 1. IOM (Institute of Medicine). 2010. Dietary reference    intakes for calcium  and D. [place]: The    [employer]. 2. Holick MF, Binkley Butler, Bischoff-Ferrari HA, et al.    Evaluation, treatment, and prevention of vitamin D     deficiency: an Endocrine Society clinical practice    guideline. JCEM. 2011 Jul; 96(7):1911-30.          Passed - Valid encounter within last 12 months    Recent Outpatient Visits           1 month ago Chronic obstructive pulmonary disease, unspecified COPD type (HCC)   [place] [facility] [place], Megan P, DO    2 months ago DOE (dyspnea on exertion)   [place] [facility] [place], Megan P, DO   5 months ago DOE (dyspnea on exertion)   [place] [facility] [place], Megan P, DO   7 months ago DOE (dyspnea on exertion)   [place] [facility] [place], [place], DO

## 2023-10-18 NOTE — Patient Instructions (Signed)
 Pulmonary rehab phone number: - [phone]

## 2023-10-18 NOTE — Progress Notes (Unsigned)
 BP 111/73 (BP Location: Left Arm, Patient Position: Sitting, Cuff Size: Large)   Pulse 90   Ht 5' 6 (1.676 m)   Wt 213 lb (96.6 kg)   LMP  (LMP Unknown)   SpO2 96%   BMI 34.38 kg/m    Subjective:    Patient ID: Chelsey [name], female    DOB: [birth year], 73 y.o.   MRN: [id number]  HPI: Chelsey [name] is a 72 y.o. female  Chief Complaint  Patient presents with   Shortness of Breath   Knee Pain    bilateral   Hip Pain    Left hip, has been a problem for awhile that she's just put off due to other issues    Breathing is not any better. She notes that sometimes it is better and sometimes it is worse. Has not heard anything from pulmonary rehab yet. No other concerns or complaints at this time.   KNEE PAIN Duration: chronic but worse recently Involved knee: bilateral Mechanism of injury: unknown Location:diffuse Onset: gradual Severity: moderate  Quality:  aching and sore Frequency: intermittent Radiation: no Aggravating factors: weight bearing, walking, running, and stairs  Alleviating factors: rest  Status: worse Treatments attempted: none  Relief with NSAIDs?:  mild Weakness with weight bearing or walking: no Sensation of giving way: no Locking: no Popping: no Bruising: no Swelling: no Redness: no Paresthesias/decreased sensation: no Fevers: no  Relevant past medical, surgical, family and social history reviewed and updated as indicated. Interim medical history since our last visit reviewed. Allergies and medications reviewed and updated.  Review of Systems  Constitutional: Negative.   Respiratory:  Positive for shortness of breath. Negative for apnea, cough, choking, chest tightness, wheezing and stridor.   Cardiovascular: Negative.   Gastrointestinal: Negative.   Musculoskeletal:  Positive for arthralgias and myalgias. Negative for back pain, gait problem, joint swelling, neck pain and neck stiffness.  Skin: Negative.   Neurological: Negative.    Psychiatric/Behavioral: Negative.      Per HPI unless specifically indicated above     Objective:    BP 111/73 (BP Location: Left Arm, Patient Position: Sitting, Cuff Size: Large)   Pulse 90   Ht 5' 6 (1.676 m)   Wt 213 lb (96.6 kg)   LMP  (LMP Unknown)   SpO2 96%   BMI 34.38 kg/m   Wt Readings from Last 3 Encounters:  10/18/23 213 lb (96.6 kg)  08/26/23 214 lb 6.4 oz (97.3 kg)  07/27/23 209 lb 3.2 oz (94.9 kg)    Physical Exam Vitals and nursing note reviewed.  Constitutional:      General: She is not in acute distress.    Appearance: Normal appearance. She is well-developed. She is obese. She is not ill-appearing, toxic-appearing or diaphoretic.  HENT:     Head: Normocephalic and atraumatic.     Right Ear: External ear normal.     Left Ear: External ear normal.     Nose: Nose normal.     Mouth/Throat:     Mouth: Mucous membranes are moist.     Pharynx: Oropharynx is clear.  Eyes:     General: No scleral icterus.       Right eye: No discharge.        Left eye: No discharge.     Extraocular Movements: Extraocular movements intact.     Conjunctiva/sclera: Conjunctivae normal.     Pupils: Pupils are equal, round, and reactive to light.  Cardiovascular:     Rate and  Rhythm: Normal rate and regular rhythm.     Pulses: Normal pulses.     Heart sounds: Normal heart sounds. No murmur heard.    No friction rub. No gallop.  Pulmonary:     Effort: Pulmonary effort is normal. No respiratory distress.     Breath sounds: Normal breath sounds. No stridor. No wheezing, rhonchi or rales.  Chest:     Chest wall: No tenderness.  Musculoskeletal:        General: Normal range of motion.     Cervical back: Normal range of motion and neck supple.  Skin:    General: Skin is warm and dry.     Capillary Refill: Capillary refill takes less than 2 seconds.     Coloration: Skin is not jaundiced or pale.     Findings: No bruising, erythema, lesion or rash.  Neurological:      General: No focal deficit present.     Mental Status: She is alert and oriented to person, place, and time. Mental status is at baseline.  Psychiatric:        Mood and Affect: Mood normal.        Behavior: Behavior normal.        Thought Content: Thought content normal.        Judgment: Judgment normal.     Results for orders placed or performed in visit on 07/27/23  Lipid Panel w/o Chol/HDL Ratio   Collection Time: 07/27/23 11:49 AM  Result Value Ref Range   Cholesterol, Total 170 100 - 199 mg/dL   Triglycerides 870 0 - 149 mg/dL   HDL 46 >60 mg/dL   VLDL Cholesterol Cal 23 5 - 40 mg/dL   LDL Chol Calc (NIH) 898 (H) 0 - 99 mg/dL  Comprehensive metabolic panel with GFR   Collection Time: 07/27/23 11:49 AM  Result Value Ref Range   Glucose 109 (H) 70 - 99 mg/dL   BUN 16 8 - 27 mg/dL   Creatinine, Ser 8.66 (H) 0.57 - 1.00 mg/dL   eGFR 43 (L) >40 fO/fpw/8.26   BUN/Creatinine Ratio 12 12 - 28   Sodium 139 134 - 144 mmol/L   Potassium 4.4 3.5 - 5.2 mmol/L   Chloride 102 96 - 106 mmol/L   CO2 18 (L) 20 - 29 mmol/L   Calcium  9.3 8.7 - 10.3 mg/dL   Total Protein 6.7 6.0 - 8.5 g/dL   Albumin  4.0 3.8 - 4.8 g/dL   Globulin, Total 2.7 1.5 - 4.5 g/dL   Bilirubin Total 0.8 0.0 - 1.2 mg/dL   Alkaline Phosphatase 147 (H) 44 - 121 IU/L   AST 26 0 - 40 IU/L   ALT 27 0 - 32 IU/L  VITAMIN D  25 Hydroxy (Vit-D Deficiency, Fractures)   Collection Time: 07/27/23 11:49 AM  Result Value Ref Range   Vit D, 25-Hydroxy 56.0 30.0 - 100.0 ng/mL  POC Covid19/Flu A&B Antigen   Collection Time: 07/27/23 11:57 AM  Result Value Ref Range   Influenza A Antigen, POC Negative Negative   Influenza B Antigen, POC Negative Negative   Covid Antigen, POC Negative Negative      Assessment & Plan:   Problem List Items Addressed This Visit       Other   Chronic pain of both knees   Relevant Orders   DG Knee Complete 4 Views Left   DG Knee Complete 4 Views Right   Other Visit Diagnoses       DOE  (dyspnea on exertion)    -  Primary   No significant change. Has not started pulmonary rehab. Will start and recheck at physcial in January. Call with any concerns.     Right hip pain       Will check x-rays and treat with voltaren . Call with any concerns. Continue to monitor.   Relevant Orders   DG Hip Unilat W OR W/O Pelvis 2-3 Views Right     Need for COVID-19 vaccine       COVID shot given today.   Relevant Orders   Pfizer Comirnaty Covid -19 Vaccine 19yrs and older        Follow up plan: Return in about 3 months (around 01/30/2024) for physical.

## 2023-10-19 DIAGNOSIS — R0609 Other forms of dyspnea: Secondary | ICD-10-CM | POA: Diagnosis not present

## 2023-10-19 DIAGNOSIS — M25562 Pain in left knee: Secondary | ICD-10-CM | POA: Diagnosis not present

## 2023-10-19 DIAGNOSIS — Z23 Encounter for immunization: Secondary | ICD-10-CM

## 2023-10-28 ENCOUNTER — Ambulatory Visit
Admission: RE | Admit: 2023-10-28 | Discharge: 2023-10-28 | Disposition: A | Discharge status: Home / Self Care | Source: Ambulatory Visit | Attending: Oncology | Admitting: Oncology

## 2023-10-28 ENCOUNTER — Other Ambulatory Visit: Payer: Self-pay | Admitting: Oncology

## 2023-10-28 DIAGNOSIS — R92333 Mammographic heterogeneous density, bilateral breasts: Secondary | ICD-10-CM | POA: Diagnosis not present

## 2023-10-28 DIAGNOSIS — C50919 Malignant neoplasm of unspecified site of unspecified female breast: Secondary | ICD-10-CM

## 2023-10-28 DIAGNOSIS — Z9889 Other specified postprocedural states: Secondary | ICD-10-CM | POA: Diagnosis not present

## 2023-10-28 DIAGNOSIS — Z08 Encounter for follow-up examination after completed treatment for malignant neoplasm: Secondary | ICD-10-CM | POA: Insufficient documentation

## 2023-10-28 DIAGNOSIS — Z923 Personal history of irradiation: Secondary | ICD-10-CM | POA: Insufficient documentation

## 2023-10-28 DIAGNOSIS — Z853 Personal history of malignant neoplasm of breast: Secondary | ICD-10-CM | POA: Insufficient documentation

## 2023-10-28 DIAGNOSIS — R928 Other abnormal and inconclusive findings on diagnostic imaging of breast: Secondary | ICD-10-CM | POA: Diagnosis present

## 2023-11-07 ENCOUNTER — Encounter: Payer: Self-pay | Admitting: Oncology

## 2023-11-07 ENCOUNTER — Inpatient Hospital Stay: Attending: Oncology | Admitting: Oncology

## 2023-11-07 VITALS — BP 122/87 | HR 87 | Temp 97.3°F | Resp 16 | Wt 214.0 lb

## 2023-11-07 DIAGNOSIS — Z923 Personal history of irradiation: Secondary | ICD-10-CM | POA: Diagnosis not present

## 2023-11-07 DIAGNOSIS — Z79811 Long term (current) use of aromatase inhibitors: Secondary | ICD-10-CM | POA: Insufficient documentation

## 2023-11-07 DIAGNOSIS — Z1721 Progesterone receptor positive status: Secondary | ICD-10-CM | POA: Insufficient documentation

## 2023-11-07 DIAGNOSIS — M8580 Other specified disorders of bone density and structure, unspecified site: Secondary | ICD-10-CM | POA: Insufficient documentation

## 2023-11-07 DIAGNOSIS — Z170 Estrogen receptor positive status [ER+]: Secondary | ICD-10-CM | POA: Diagnosis not present

## 2023-11-07 DIAGNOSIS — C50919 Malignant neoplasm of unspecified site of unspecified female breast: Secondary | ICD-10-CM | POA: Diagnosis not present

## 2023-11-07 DIAGNOSIS — C50911 Malignant neoplasm of unspecified site of right female breast: Secondary | ICD-10-CM | POA: Insufficient documentation

## 2023-11-07 NOTE — Progress Notes (Signed)
 [facility]  Telephone:([phone]) [phone] Fax:(336) [phone]  ID: Chelsey [name] OB: [birth year]  MR#: [id number]  RDW#:[id number]  Patient Care Team: Vicci Duwaine SQUIBB, DO as PCP - General (Family Medicine) Darliss Rogue, MD as PCP - Cardiology (Cardiology) Georgina Shasta POUR, RN as Oncology Nurse Navigator Lenn Aran, MD as Consulting Physician (Radiation Oncology) Jacobo Evalene PARAS, MD as Consulting Physician (Oncology) Tye Millet, DO as Consulting Physician (General Surgery) Therisa Bi, MD as Consulting Physician (Gastroenterology)  CHIEF COMPLAINT: Pathologic stage Ia ER/PR positive, HER2 negative invasive carcinoma of the right breast.  Oncotype Dx 8.  INTERVAL HISTORY: Patient returns to clinic today for routine 67-month evaluation.  She currently feels well and is asymptomatic.  She is tolerating letrozole  without significant side effects. She has no neurologic complaints.  She denies any recent fevers or illnesses.  She has a good appetite and denies weight loss.  She has no chest pain, shortness of breath, cough, or hemoptysis.  She denies any nausea, vomiting, constipation, or diarrhea.  She has no urinary complaints.  Patient offers no specific complaints today.  REVIEW OF SYSTEMS:   Review of Systems  Constitutional: Negative.  Negative for fever, malaise/fatigue and weight loss.  Respiratory: Negative.  Negative for cough and shortness of breath.   Cardiovascular: Negative.  Negative for chest pain and leg swelling.  Gastrointestinal: Negative.  Negative for abdominal pain.  Genitourinary: Negative.  Negative for dysuria.  Musculoskeletal: Negative.  Negative for back pain.  Skin: Negative.  Negative for rash.  Neurological: Negative.  Negative for dizziness, focal weakness, weakness and headaches.  Psychiatric/Behavioral: Negative.  The patient is not nervous/anxious.     As per HPI. Otherwise, a complete review of systems is negative.  PAST  MEDICAL HISTORY: Past Medical History:  Diagnosis Date   Aortic atherosclerosis    a. Noted on CT abd 08/2019.   Breast cancer [facility]) 1997   right breast, radiation   Bronchitis    recent/ 06/09/15 had chest xray/ [facility]/resolved   Cancer [facility]) 1997   right lumpectomy,L/Ad/R    Coronary artery calcification seen on CT scan    a. 04/2023 MV: Minimal coronary calcifications noted on CT correction imaging of stress test.   Dyspnea on exertion    a. 03/2023 Echo: EF 60-65%, GrI DD, mod MR; b. 04/2023 MV: EF>65%, no isch/infarct, minimal cor Ca2+.   GERD (gastroesophageal reflux disease)    History of hiatal hernia    Hyperlipidemia    Low BP    a. typically 80's/60's   Moderate mitral regurgitation    a. 03/2023 Echo: EF 60-65%, no rwma, GrI DD, nl RV size/fxn, mod dil LA, mod MR, AoV sclerosis w/o stenosis.   Osteopenia    Panic attack    Paraesophageal hernia    Personal history of malignant neoplasm of breast    Personal history of radiation therapy     PAST SURGICAL HISTORY: Past Surgical History:  Procedure Laterality Date   BICEPT TENODESIS Left 11/23/2016   Procedure: BICEPS TENODESIS;  Surgeon: Tobie Priest, MD;  Location: [facility];  Service: Orthopedics;  Laterality: Left;   BREAST BIOPSY Right 12/21/2021   MM RT RADIO FREQUENCY TAG LOC MAMMO GUIDE 12/21/2021 [facility]   BREAST EXCISIONAL BIOPSY Right 1997   pos   BREAST LUMPECTOMY Right 12/25/2021   breast cancer   BREAST SURGERY Right 1997   lumpectomy   CHOLECYSTECTOMY N/A 08/06/2016   Procedure: LAPAROSCOPIC CHOLECYSTECTOMY WITH INTRAOPERATIVE CHOLANGIOGRAM;  Surgeon: Dellie Fonder  G, MD;  Location: [facility] OR[facility];  Service: General;  Laterality: N/A;   COLONOSCOPY  2008   COLONOSCOPY WITH PROPOFOL  N/A 07/21/2015   Procedure: COLONOSCOPY WITH PROPOFOL ;  Surgeon: Rogelia Copping, MD;  Location: [facility];  Service: Endoscopy;  Laterality: N/A;   COLONOSCOPY WITH PROPOFOL  N/A 01/05/2018    Procedure: COLONOSCOPY WITH PROPOFOL ;  Surgeon: Therisa Bi, MD;  Location: [facility] ENDOSCOPY;  Service: Gastroenterology;  Laterality: N/A;   COLONOSCOPY WITH PROPOFOL  N/A 03/23/2023   Procedure: COLONOSCOPY WITH PROPOFOL ;  Surgeon: Therisa Bi, MD;  Location: [facility] ENDOSCOPY;  Service: Gastroenterology;  Laterality: N/A;   ESOPHAGOGASTRODUODENOSCOPY (EGD) WITH PROPOFOL  N/A 06/16/2016   Procedure: ESOPHAGOGASTRODUODENOSCOPY (EGD) WITH PROPOFOL ;  Surgeon: Dellie Louanne MATSU, MD;  Location: [facility] ENDOSCOPY;  Service: Endoscopy;  Laterality: N/A;   ESOPHAGOGASTRODUODENOSCOPY (EGD) WITH PROPOFOL  N/A 01/05/2018   Procedure: ESOPHAGOGASTRODUODENOSCOPY (EGD) WITH PROPOFOL ;  Surgeon: Therisa Bi, MD;  Location: [facility] ENDOSCOPY;  Service: Gastroenterology;  Laterality: N/A;   ESOPHAGOGASTRODUODENOSCOPY (EGD) WITH PROPOFOL  N/A 06/13/2018   Procedure: ESOPHAGOGASTRODUODENOSCOPY (EGD) WITH PROPOFOL ;  Surgeon: Therisa Bi, MD;  Location: [facility] ENDOSCOPY;  Service: Gastroenterology;  Laterality: N/A;   ESOPHAGOGASTRODUODENOSCOPY (EGD) WITH PROPOFOL  N/A 07/16/2018   Procedure: ESOPHAGOGASTRODUODENOSCOPY (EGD) WITH PROPOFOL ;  Surgeon: Copping Rogelia, MD;  Location: [facility] ENDOSCOPY;  Service: Endoscopy;  Laterality: N/A;   HARDWARE REMOVAL Left 12/27/2016   Procedure: HARDWARE REMOVAL LEFT  PROXIMAL HUMEROUS;  Surgeon: Tobie Priest, MD;  Location: [facility];  Service: Orthopedics;  Laterality: Left;   IR GJ TUBE CHANGE  04/21/2018   NISSEN FUNDOPLICATION N/A 03/09/2018   Procedure: NISSEN FUNDOPLICATION;  Surgeon: Jordis Laneta FALCON, MD;  Location: [facility];  Service: General;  Laterality: N/A;   ORIF HUMERUS FRACTURE Left 11/23/2016   Procedure: OPEN REDUCTION INTERNAL FIXATION (ORIF) PROXIMAL HUMERUS FRACTURE;  Surgeon: Tobie Priest, MD;  Location: [facility];  Service: Orthopedics;  Laterality: Left;   PART MASTECTOMY,RADIO FREQUENCY LOCALIZER,AXILLARY SENTINEL NODE BIOPSY Right 12/25/2021   Procedure: PART MASTECTOMY,RADIO  FREQUENCY LOCALIZER,AXILLARY SENTINEL NODE BIOPSY;  Surgeon: Tye Millet, DO;  Location: [facility];  Service: General;  Laterality: Right;   POLYPECTOMY  03/23/2023   Procedure: POLYPECTOMY;  Surgeon: Therisa Bi, MD;  Location: [facility] ENDOSCOPY;  Service: Gastroenterology;;   REPAIR OF ESOPHAGUS  03/09/2018   Procedure: REPAIR OF ESOPHAGUS;  Surgeon: Jordis Laneta FALCON, MD;  Location: [facility];  Service: General;;   REVERSE SHOULDER ARTHROPLASTY Left 12/27/2016   Procedure: REVERSE SHOULDER ARTHROPLASTY;  Surgeon: Tobie Priest, MD;  Location: [facility];  Service: Orthopedics;  Laterality: Left;   ROBOTIC ASSISTED LAPAROSCOPIC REPAIR OF PARAESOPHAGEAL HERNIA N/A 03/09/2018   Procedure: ROBOTIC HIATAL HERNIA CONVERTED TO OPEN;  Surgeon: Jordis Laneta FALCON, MD;  Location: [facility];  Service: General;  Laterality: N/A;    FAMILY HISTORY: Family History  Problem Relation Age of Onset   Kidney disease Mother    Cancer Father        stomach   Heart attack Father    Hypertension Father    Alcohol abuse Father    Anxiety disorder Brother    Obesity Brother    COPD Brother    Depression Brother    Anxiety disorder Brother    Cancer Maternal Grandmother    Breast cancer Maternal Grandmother     ADVANCED DIRECTIVES (Y/N):  N  HEALTH MAINTENANCE: Social History   Tobacco Use   Smoking status: Every Day    Current packs/day: 0.15    Average packs/day: 0.2 packs/day for 13.9 years (2.1 ttl pk-yrs)  Types: Cigarettes    Start date: 11/25/2009   Smokeless tobacco: Never   Tobacco comments:    2 cigarette daily  Vaping Use   Vaping status: Never Used  Substance Use Topics   Alcohol use: No    Alcohol/week: 0.0 standard drinks of alcohol   Drug use: No     Colonoscopy:  PAP:  Bone density:  Lipid panel:  No Known Allergies  Current Outpatient Medications  Medication Sig Dispense Refill   albuterol  (VENTOLIN  HFA) 108 (90 Base) MCG/ACT inhaler Inhale 2 puffs into the lungs every 4 (four)  hours as needed for wheezing or shortness of breath. 8 g 2   aspirin  EC 81 MG tablet Take 1 tablet (81 mg total) by mouth daily. Swallow whole.     atorvastatin  (LIPITOR) 20 MG tablet Take 1 tablet (20 mg total) by mouth daily. 90 tablet 1   budesonide -glycopyrrolate -formoterol  (BREZTRI  AEROSPHERE) 160-9-4.8 MCG/ACT AERO inhaler Inhale 2 puffs into the lungs 2 (two) times daily.     buPROPion  (WELLBUTRIN  XL) 300 MG 24 hr tablet One tab Once a day 90 tablet 1   busPIRone  (BUSPAR ) 10 MG tablet Take 1 tablet (10 mg total) by mouth 2 (two) times daily. 180 tablet 1   diclofenac  Sodium (VOLTAREN ) 1 % GEL Apply 4 g topically 4 (four) times daily. 350 g 3   letrozole  (FEMARA ) 2.5 MG tablet TAKE 1 TABLET(2.5 MG) BY MOUTH DAILY 90 tablet 3   pantoprazole  (PROTONIX ) 40 MG tablet Take 1 tablet (40 mg total) by mouth daily. 90 tablet 3   Spacer/Aero-Hold Chamber Mask MISC Use as directed 1 each 0   trazodone  (DESYREL ) 300 MG tablet TAKE 1 TABLET(300 MG) BY MOUTH AT BEDTIME 90 tablet 1   Vitamin D , Ergocalciferol , (DRISDOL ) 1.25 MG (50000 UNIT) CAPS capsule TAKE 1 CAPSULE BY MOUTH EVERY 7 DAYS 12 capsule 0   No current facility-administered medications for this visit.   Facility-Administered Medications Ordered in Other Visits  Medication Dose Route Frequency Provider Last Rate Last Admin   lidocaine  (PF) (XYLOCAINE ) 1 % injection 10 mL  10 mL Other Once [employer], Isami, DO        OBJECTIVE: Vitals:   11/07/23 1024  BP: 122/87  Pulse: 87  Resp: 16  Temp: (!) 97.3 F (36.3 C)  SpO2: 98%     Body mass index is 34.54 kg/m.    ECOG FS:0 - Asymptomatic  General: Well-developed, well-nourished, no acute distress. Eyes: Pink conjunctiva, anicteric sclera. HEENT: Normocephalic, moist mucous membranes. Lungs: No audible wheezing or coughing. Heart: Regular rate and rhythm. Abdomen: Soft, nontender, no obvious distention. Musculoskeletal: No edema, cyanosis, or clubbing. Neuro: Alert, answering all  questions appropriately. Cranial nerves grossly intact. Skin: No rashes or petechiae noted. Psych: Normal affect.  LAB RESULTS:  Lab Results  Component Value Date   NA 139 07/27/2023   K 4.4 07/27/2023   CL 102 07/27/2023   CO2 18 (L) 07/27/2023   GLUCOSE 109 (H) 07/27/2023   BUN 16 07/27/2023   CREATININE 1.33 (H) 07/27/2023   CALCIUM  9.3 07/27/2023   PROT 6.7 07/27/2023   ALBUMIN  4.0 07/27/2023   AST 26 07/27/2023   ALT 27 07/27/2023   ALKPHOS 147 (H) 07/27/2023   BILITOT 0.8 07/27/2023   GFRNONAA >60 12/23/2021   GFRAA 93 01/21/2020    Lab Results  Component Value Date   WBC 5.4 01/28/2023   NEUTROABS 3.6 01/28/2023   HGB 13.4 01/28/2023   HCT 41.0 01/28/2023  MCV 92 01/28/2023   PLT 229 01/28/2023     STUDIES: MM DIAG BREAST TOMO BILATERAL Result Date: 10/28/2023 CLINICAL DATA:  History of RIGHT lumpectomy with radiation in December 2023 as well as a remote history of RIGHT lumpectomy in 1999. EXAM: DIGITAL DIAGNOSTIC BILATERAL MAMMOGRAM WITH TOMOSYNTHESIS AND CAD; ULTRASOUND LEFT BREAST LIMITED TECHNIQUE: Bilateral digital diagnostic mammography and breast tomosynthesis was performed. The images were evaluated with computer-aided detection. ; Targeted ultrasound examination of the left breast was performed. COMPARISON:  Previous exam(s). ACR Breast Density Category c: The breasts are heterogeneously dense, which may obscure small masses. FINDINGS: There is density and architectural distortion within the RIGHT breast, consistent with postsurgical changes. These are stable in comparison to prior. Questioned asymmetry resolves with additional views, consistent with overlapping tissue. No suspicious mass, distortion, or microcalcifications are identified to suggest presence of malignancy in the RIGHT breast. A questioned asymmetry in the LEFT breast resolves with additional views, consistent with overlapping tissue. No suspicious mass, distortion, or microcalcifications are  identified to suggest presence of malignancy in the LEFT breast. Targeted ultrasound was performed of the LEFT outer breast. No suspicious cystic or solid mass is seen at the site of asymmetry concern. IMPRESSION: 1. No mammographic evidence of malignancy bilaterally. RECOMMENDATION: Recommend bilateral diagnostic mammogram (with RIGHT and LEFT breast ultrasound if deemed necessary) in 1 year. This will establish over 2 years stability status post lumpectomy. I have discussed the findings and recommendations with the patient. If applicable, a reminder letter will be sent to the patient regarding the next appointment. BI-RADS CATEGORY  2: Benign. Electronically Signed   By: Corean Salter M.D.   On: 10/28/2023 11:43   US  LIMITED ULTRASOUND INCLUDING AXILLA LEFT BREAST  Result Date: 10/28/2023 CLINICAL DATA:  History of RIGHT lumpectomy with radiation in December 2023 as well as a remote history of RIGHT lumpectomy in 1999. EXAM: DIGITAL DIAGNOSTIC BILATERAL MAMMOGRAM WITH TOMOSYNTHESIS AND CAD; ULTRASOUND LEFT BREAST LIMITED TECHNIQUE: Bilateral digital diagnostic mammography and breast tomosynthesis was performed. The images were evaluated with computer-aided detection. ; Targeted ultrasound examination of the left breast was performed. COMPARISON:  Previous exam(s). ACR Breast Density Category c: The breasts are heterogeneously dense, which may obscure small masses. FINDINGS: There is density and architectural distortion within the RIGHT breast, consistent with postsurgical changes. These are stable in comparison to prior. Questioned asymmetry resolves with additional views, consistent with overlapping tissue. No suspicious mass, distortion, or microcalcifications are identified to suggest presence of malignancy in the RIGHT breast. A questioned asymmetry in the LEFT breast resolves with additional views, consistent with overlapping tissue. No suspicious mass, distortion, or microcalcifications are  identified to suggest presence of malignancy in the LEFT breast. Targeted ultrasound was performed of the LEFT outer breast. No suspicious cystic or solid mass is seen at the site of asymmetry concern. IMPRESSION: 1. No mammographic evidence of malignancy bilaterally. RECOMMENDATION: Recommend bilateral diagnostic mammogram (with RIGHT and LEFT breast ultrasound if deemed necessary) in 1 year. This will establish over 2 years stability status post lumpectomy. I have discussed the findings and recommendations with the patient. If applicable, a reminder letter will be sent to the patient regarding the next appointment. BI-RADS CATEGORY  2: Benign. Electronically Signed   By: Corean Salter M.D.   On: 10/28/2023 11:43    ASSESSMENT: Pathologic stage Ia ER/PR positive, HER2 negative invasive carcinoma of the right breast.  Oncotype Dx 8  PLAN:    Pathologic stage Ia ER/PR positive, HER2 negative invasive  carcinoma of the right breast: Patient has a distant history of right breast cancer in 1997.  She reports she underwent lumpectomy and XRT at that time.  She did not have chemotherapy or hormonal therapy.  Patient underwent lumpectomy on December 25, 2021 confirming stage of disease.  Oncotype Dx score was 8 which is considered low risk therefore no adjuvant chemotherapy was necessary.  Patient completed 10 fractions of partial breast irradiation in January 2024.  Continue letrozole  for total 5 years completing treatment in January 2029.  Her most recent mammogram in October 28, 2023 was reported as BI-RADS 2.  Repeat in October 2026.  Return to clinic in 6 months for routine evaluation.   Osteopenia: Chronic and unchanged.  Patient's most recent bone mineral density on March 21, 2023 reported a T-score of -2.1.  This is essentially unchanged from 1 year prior where her T-score was reported -2.0.  Continue calcium  and vitamin D  supplementation.  Repeat bone mineral density in February 2026.  I spent a  total of 20 minutes reviewing chart data, face-to-face evaluation with the patient, counseling and coordination of care as detailed above.   Patient expressed understanding and was in agreement with this plan. She also understands that She can call clinic at any time with any questions, concerns, or complaints.    Cancer Staging  Invasive ductal carcinoma of right breast in female [facility]) Staging form: Breast, AJCC 8th Edition - Clinical stage from 12/02/2021: Stage IA (cT1c, cN0, cM0, G2, ER+, PR+, HER2-) - Signed by Jacobo Evalene PARAS, MD on 12/02/2021 Stage prefix: Initial diagnosis Histologic grading system: 3 grade system   Evalene PARAS Jacobo, MD   11/07/2023 10:43 AM

## 2023-12-09 ENCOUNTER — Ambulatory Visit: Admitting: Podiatry

## 2024-01-12 ENCOUNTER — Ambulatory Visit: Payer: Self-pay | Admitting: Emergency Medicine

## 2024-01-12 VITALS — Ht 66.0 in | Wt 200.0 lb

## 2024-01-12 DIAGNOSIS — Z0000 Encounter for general adult medical examination without abnormal findings: Secondary | ICD-10-CM | POA: Diagnosis not present

## 2024-01-12 NOTE — Patient Instructions (Signed)
 Ms. Monsanto,  Thank you for taking the time for your Medicare Wellness Visit. I appreciate your continued commitment to your health goals. Please review the care plan we discussed, and feel free to reach out if I can assist you further.  Please note that Annual Wellness Visits do not include a physical exam. Some assessments may be limited, especially if the visit was conducted virtually. If needed, we may recommend an in-person follow-up with your provider.  Ongoing Care Seeing your primary care provider every 3 to 6 months helps us  monitor your health and provide consistent, personalized care.   Referrals If a referral was made during today's visit and you haven't received any updates within two weeks, please contact the referred provider directly to check on the status.  Recommended Screenings:  You may get your flu vaccine at your next OV on 01/30/24.  You may get the shingles vaccines at your local pharmacy at your convenience.  Recommend getting a routine eye exam every 1-2 years. I have included a list of eye doctors in the area.   Health Maintenance  Topic Date Due   Zoster (Shingles) Vaccine (1 of 2) 07[birth year]05/1970   Flu Shot  08/26/2023   Medicare Annual Wellness Visit  01/06/2024   Breast Cancer Screening  10/27/2024   Osteoporosis screening with Bone Density Scan  03/20/2025   Colon Cancer Screening  03/22/2026   DTaP/Tdap/Td vaccine (3 - Td or Tdap) 07/28/2027   Pneumococcal Vaccine for age over 23  Completed   Hepatitis C Screening  Completed   Meningitis B Vaccine  Aged Out   COVID-19 Vaccine  Discontinued       01/12/2024   12:41 PM  Advanced Directives  Does Patient Have a Medical Advance Directive? Yes  Type of Advance Directive Out of facility DNR (pink MOST or yellow form)  Does patient want to make changes to medical advance directive? No - Patient declined    Vision: Annual vision screenings are recommended for early detection of glaucoma, cataracts, and  diabetic retinopathy. These exams can also reveal signs of chronic conditions such as diabetes and high blood pressure.  Dental: Annual dental screenings help detect early signs of oral cancer, gum disease, and other conditions linked to overall health, including heart disease and diabetes.  Please see the attached documents for additional preventive care recommendations.   There are several Eye Doctors in your area. Here are a few that usually accept all insurance types:  [facility] [address] [place], [place] [address] Phone: [phone]  [facility] [address] [place], [place] [address] Phone: [phone]  [facility] [address] [place], [place] [address] Phone: [phone]  [facility] [address] [place], [place] [address] Phone: [phone]  [facility] [address] [place], [place] [address] Phone: [phone]  [facility] [address] [place], [place] [address] Phone: [phone]

## 2024-01-12 NOTE — Progress Notes (Signed)
 Chief Complaint  Patient presents with   Medicare Wellness     Subjective:   Chelsey DEFINO is a 73 y.o. female who presents for a Medicare Annual Wellness Visit.  Visit info / Clinical Intake: Medicare Wellness Visit Type:: Subsequent Annual Wellness Visit Persons participating in visit and providing information:: patient Medicare Wellness Visit Mode:: Telephone If telephone:: video declined Since this visit was completed virtually, some vitals may be partially provided or unavailable. Missing vitals are due to the limitations of the virtual format.: Documented vitals are patient reported If Telephone or Video please confirm:: I connected with patient using audio/video enable telemedicine. I verified patient identity with two identifiers, discussed telehealth limitations, and patient agreed to proceed. Patient Location:: home Provider Location:: home office Interpreter Needed?: No Pre-visit prep was completed: yes AWV questionnaire completed by patient prior to visit?: no Living arrangements:: with family/others Patient's Overall Health Status Rating: good Typical amount of pain: some Does pain affect daily life?: no Are you currently prescribed opioids?: no  Dietary Habits and Nutritional Risks How many meals a day?: 2 Eats fruit and vegetables daily?: yes Most meals are obtained by: having others provide food (son prepares meals) In the last 2 weeks, have you had any of the following?: none Diabetic:: no  Functional Status Activities of Daily Living (to include ambulation/medication): Independent Ambulation: Independent with device- listed below Home Assistive Devices/Equipment: Eyeglasses (glasses for reading) Medication Administration: Independent Home Management (perform basic housework or laundry): Independent Manage your own finances?: yes Primary transportation is: driving Concerns about vision?: no *vision screening is required for WTM* Concerns about  hearing?: no  Fall Screening Falls in the past year?: 1 Number of falls in past year: 0 Was there an injury with Fall?: 0 Fall Risk Category Calculator: 1 Patient Fall Risk Level: Low Fall Risk  Fall Risk Patient at Risk for Falls Due to: History of fall(s); Impaired balance/gait Fall risk Follow up: Falls evaluation completed; Education provided; Falls prevention discussed  Home and Transportation Safety: All rugs have non-skid backing?: N/A, no rugs All stairs or steps have railings?: (!) no (2 steps without handrails) Grab bars in the bathtub or shower?: (!) no Have non-skid surface in bathtub or shower?: yes Good home lighting?: yes Regular seat belt use?: yes Hospital stays in the last year:: no  Cognitive Assessment Difficulty concentrating, remembering, or making decisions? : no Will 6CIT or Mini Cog be Completed: yes What year is it?: 0 points What month is it?: 0 points Give patient an address phrase to remember (5 components): [address] [place] About what time is it?: 0 points Count backwards from 20 to 1: 0 points Say the months of the year in reverse: 0 points Repeat the address phrase from earlier: 0 points 6 CIT Score: 0 points  Advance Directives (For Healthcare) Does Patient Have a Medical Advance Directive?: Yes Does patient want to make changes to medical advance directive?: No - Patient declined Type of Advance Directive: Out of facility DNR (pink MOST or yellow form) Out of facility DNR (pink MOST or yellow form) in Chart? (Ambulatory ONLY): Yes - validated most recent copy scanned in chart Would patient like information on creating a medical advance directive?: No - Patient declined  Reviewed/Updated  Reviewed/Updated: Reviewed All (Medical, Surgical, Family, Medications, Allergies, Care Teams, Patient Goals)    Allergies (verified) Patient has no known allergies.   Current Medications (verified) Outpatient Encounter Medications as of  01/12/2024  Medication Sig  albuterol  (VENTOLIN  HFA) 108 (90 Base) MCG/ACT inhaler Inhale 2 puffs into the lungs every 4 (four) hours as needed for wheezing or shortness of breath.   aspirin  EC 81 MG tablet Take 1 tablet (81 mg total) by mouth daily. Swallow whole.   atorvastatin  (LIPITOR) 20 MG tablet Take 1 tablet (20 mg total) by mouth daily.   buPROPion  (WELLBUTRIN  XL) 300 MG 24 hr tablet One tab Once a day   busPIRone  (BUSPAR ) 10 MG tablet Take 1 tablet (10 mg total) by mouth 2 (two) times daily. (Patient taking differently: Take 10 mg by mouth 2 (two) times daily. Taking 1 tablet daily)   letrozole  (FEMARA ) 2.5 MG tablet TAKE 1 TABLET(2.5 MG) BY MOUTH DAILY   pantoprazole  (PROTONIX ) 40 MG tablet Take 1 tablet (40 mg total) by mouth daily.   Spacer/Aero-Hold Chamber Mask MISC Use as directed   trazodone  (DESYREL ) 300 MG tablet TAKE 1 TABLET(300 MG) BY MOUTH AT BEDTIME   budesonide -glycopyrrolate -formoterol  (BREZTRI  AEROSPHERE) 160-9-4.8 MCG/ACT AERO inhaler Inhale 2 puffs into the lungs 2 (two) times daily. (Patient not taking: Reported on 01/12/2024)   diclofenac  Sodium (VOLTAREN ) 1 % GEL Apply 4 g topically 4 (four) times daily. (Patient not taking: Reported on 01/12/2024)   Vitamin D , Ergocalciferol , (DRISDOL ) 1.25 MG (50000 UNIT) CAPS capsule TAKE 1 CAPSULE BY MOUTH EVERY 7 DAYS (Patient not taking: Reported on 01/12/2024)   Facility-Administered Encounter Medications as of 01/12/2024  Medication   lidocaine  (PF) (XYLOCAINE ) 1 % injection 10 mL    History: Past Medical History:  Diagnosis Date   Aortic atherosclerosis    a. Noted on CT abd 08/2019.   Breast cancer [facility]) 1997   right breast, radiation   Bronchitis    recent/ 06/09/15 had chest xray/ [facility]/resolved   Cancer [facility]) 1997   right lumpectomy,L/Ad/R    Coronary artery calcification seen on CT scan    a. 04/2023 MV: Minimal coronary calcifications noted on CT correction imaging of stress test.   Dyspnea on  exertion    a. 03/2023 Echo: EF 60-65%, GrI DD, mod MR; b. 04/2023 MV: EF>65%, no isch/infarct, minimal cor Ca2+.   GERD (gastroesophageal reflux disease)    History of hiatal hernia    Hyperlipidemia    Low BP    a. typically 80's/60's   Moderate mitral regurgitation    a. 03/2023 Echo: EF 60-65%, no rwma, GrI DD, nl RV size/fxn, mod dil LA, mod MR, AoV sclerosis w/o stenosis.   Osteopenia    Panic attack    Paraesophageal hernia    Personal history of malignant neoplasm of breast    Personal history of radiation therapy    Past Surgical History:  Procedure Laterality Date   BICEPT TENODESIS Left 11/23/2016   Procedure: BICEPS TENODESIS;  Surgeon: Tobie Priest, MD;  Location: [facility];  Service: Orthopedics;  Laterality: Left;   BREAST BIOPSY Right 12/21/2021   MM RT RADIO FREQUENCY TAG LOC MAMMO GUIDE 12/21/2021 [facility]   BREAST EXCISIONAL BIOPSY Right 1997   pos   BREAST LUMPECTOMY Right 12/25/2021   breast cancer   BREAST SURGERY Right 1997   lumpectomy   CHOLECYSTECTOMY N/A 08/06/2016   Procedure: LAPAROSCOPIC CHOLECYSTECTOMY WITH INTRAOPERATIVE CHOLANGIOGRAM;  Surgeon: Dellie Louanne MATSU, MD;  Location: [facility];  Service: General;  Laterality: N/A;   COLONOSCOPY  2008   COLONOSCOPY WITH PROPOFOL  N/A 07/21/2015   Procedure: COLONOSCOPY WITH PROPOFOL ;  Surgeon: Rogelia Copping, MD;  Location: [facility];  Service: Endoscopy;  Laterality: N/A;   COLONOSCOPY WITH PROPOFOL  N/A 01/05/2018   Procedure: COLONOSCOPY WITH PROPOFOL ;  Surgeon: Therisa Bi, MD;  Location: [facility] ENDOSCOPY;  Service: Gastroenterology;  Laterality: N/A;   COLONOSCOPY WITH PROPOFOL  N/A 03/23/2023   Procedure: COLONOSCOPY WITH PROPOFOL ;  Surgeon: Therisa Bi, MD;  Location: [facility] ENDOSCOPY;  Service: Gastroenterology;  Laterality: N/A;   ESOPHAGOGASTRODUODENOSCOPY (EGD) WITH PROPOFOL  N/A 06/16/2016   Procedure: ESOPHAGOGASTRODUODENOSCOPY (EGD) WITH PROPOFOL ;  Surgeon: Dellie Louanne MATSU, MD;   Location: [facility] END[facility];  Service: Endoscopy;  Laterality: N/A;   ESOPHAGOGASTRODUODENOSCOPY (EGD) WITH PROPOFOL  N/A 01/05/2018   Procedure: ESOPHAGOGASTRODUODENOSCOPY (EGD) WITH PROPOFOL ;  Surgeon: Therisa Bi, MD;  Location: [facility] END[facility]COPY;  Service: Gastroenterology;  Laterality: N/A;   ESOPHAGOGASTRODUODENOSCOPY (EGD) WITH PROPOFOL  N/A 06/13/2018   Procedure: ESOPHAGOGASTRODUODENOSCOPY (EGD) WITH PROPOFOL ;  Surgeon: Therisa Bi, MD;  Location: [facility] ENDOSCOPY;  Service: Gastroenterology;  Laterality: N/A;   ESOPHAGOGASTRODUODENOSCOPY (EGD) WITH PROPOFOL  N/A 07/16/2018   Procedure: ESOPHAGOGASTRODUODENOSCOPY (EGD) WITH PROPOFOL ;  Surgeon: Jinny Carmine, MD;  Location: [facility] ENDOSCOPY;  Service: Endoscopy;  Laterality: N/A;   HARDWARE REMOVAL Left 12/27/2016   Procedure: HARDWARE REMOVAL LEFT  PROXIMAL HUMEROUS;  Surgeon: Tobie Priest, MD;  Location: [facility];  Service: Orthopedics;  Laterality: Left;   IR GJ TUBE CHANGE  04/21/2018   NISSEN FUNDOPLICATION N/A 03/09/2018   Procedure: NISSEN FUNDOPLICATION;  Surgeon: Jordis Laneta FALCON, MD;  Location: [facility];  Service: General;  Laterality: N/A;   ORIF HUMERUS FRACTURE Left 11/23/2016   Procedure: OPEN REDUCTION INTERNAL FIXATION (ORIF) PROXIMAL HUMERUS FRACTURE;  Surgeon: Tobie Priest, MD;  Location: [facility];  Service: Orthopedics;  Laterality: Left;   PART MASTECTOMY,RADIO FREQUENCY LOCALIZER,AXILLARY SENTINEL NODE BIOPSY Right 12/25/2021   Procedure: PART MASTECTOMY,RADIO FREQUENCY LOCALIZER,AXILLARY SENTINEL NODE BIOPSY;  Surgeon: Tye Millet, DO;  Location: [facility];  Service: General;  Laterality: Right;   POLYPECTOMY  03/23/2023   Procedure: POLYPECTOMY;  Surgeon: Therisa Bi, MD;  Location: [facility] END[facility]COPY;  Service: Gastroenterology;;   REPAIR OF ESOPHAGUS  03/09/2018   Procedure: REPAIR OF ESOPHAGUS;  Surgeon: Jordis Laneta FALCON, MD;  Location: [facility];  Service: General;;   REVERSE SHOULDER ARTHROPLASTY Left 12/27/2016   Procedure: REVERSE  SHOULDER ARTHROPLASTY;  Surgeon: Tobie Priest, MD;  Location: [facility];  Service: Orthopedics;  Laterality: Left;   ROBOTIC ASSISTED LAPAROSCOPIC REPAIR OF PARAESOPHAGEAL HERNIA N/A 03/09/2018   Procedure: ROBOTIC HIATAL HERNIA CONVERTED TO OPEN;  Surgeon: Jordis Laneta FALCON, MD;  Location: [facility];  Service: General;  Laterality: N/A;   Family History  Problem Relation Age of Onset   Kidney disease Mother    Cancer Father        stomach   Heart attack Father    Hypertension Father    Alcohol abuse Father    Anxiety disorder Brother    Obesity Brother    COPD Brother    Depression Brother    Anxiety disorder Brother    Cancer Maternal Grandmother    Breast cancer Maternal Grandmother    Social History   Occupational History   Occupation: retired  Tobacco Use   Smoking status: Every Day    Current packs/day: 0.15    Average packs/day: 0.2 packs/day for 14.1 years (2.1 ttl pk-yrs)    Types: Cigarettes    Start date: 11/25/2009   Smokeless tobacco: Never   Tobacco comments:    2 cigarette daily  Vaping Use   Vaping status: Never Used  Substance and Sexual Activity   Alcohol use: No    Alcohol/week:  0.0 standard drinks of alcohol   Drug use: No   Sexual activity: Never   Tobacco Counseling Ready to quit: Not Answered Counseling given: Not Answered Tobacco comments: 2 cigarette daily  SDOH Screenings   Food Insecurity: No Food Insecurity (01/12/2024)  Housing: Low Risk (01/12/2024)  Transportation Needs: No Transportation Needs (01/12/2024)  Utilities: Not At Risk (01/12/2024)  Alcohol Screen: Low Risk (01/06/2023)  Depression (PHQ2-9): Low Risk (01/12/2024)  Financial Resource Strain: Low Risk (01/06/2023)  Physical Activity: Inactive (01/12/2024)  Social Connections: Socially Isolated (01/12/2024)  Stress: No Stress Concern Present (01/12/2024)  Tobacco Use: High Risk (01/12/2024)  Health Literacy: Adequate Health Literacy (01/12/2024)   See flowsheets for full  screening details  Depression Screen PHQ 2 & 9 Depression Scale- Over the past 2 weeks, how often have you been bothered by any of the following problems? Little interest or pleasure in doing things: 0 Feeling down, depressed, or hopeless (PHQ Adolescent also includes...irritable): 0 PHQ-2 Total Score: 0 Trouble falling or staying asleep, or sleeping too much: 0 Feeling tired or having little energy: 3 Poor appetite or overeating (PHQ Adolescent also includes...weight loss): 0 Feeling bad about yourself - or that you are a failure or have let yourself or your family down: 0 Trouble concentrating on things, such as reading the newspaper or watching television (PHQ Adolescent also includes...like school work): 0 Moving or speaking so slowly that other people could have noticed. Or the opposite - being so fidgety or restless that you have been moving around a lot more than usual: 0 Thoughts that you would be better off dead, or of hurting yourself in some way: 0 PHQ-9 Total Score: 3 If you checked off any problems, how difficult have these problems made it for you to do your work, take care of things at home, or get along with other people?: Somewhat difficult  Depression Treatment Depression Interventions/Treatment : EYV7-0 Score <4 Follow-up Not Indicated; Currently on Treatment     Goals Addressed               This Visit's Progress     Weight (lb) < 170 lb (77.1 kg) (pt-stated)   200 lb (90.7 kg)            Objective:    Today's Vitals   01/12/24 1237  Weight: 200 lb (90.7 kg)  Height: 5' 6 (1.676 m)   Body mass index is 32.28 kg/m.  Hearing/Vision screen Hearing Screening - Comments:: Denies hearing loss  Vision Screening - Comments:: Recommended a routine eye exam. Included list of eye doctors in AVS Immunizations and Health Maintenance Health Maintenance  Topic Date Due   Zoster Vaccines- Shingrix  (1 of 2) 07/05/1970   Influenza Vaccine  08/26/2023    Mammogram  10/27/2024   Medicare Annual Wellness (AWV)  01/11/2025   Bone Density Scan  03/20/2025   Colonoscopy  03/22/2026   DTaP/Tdap/Td (3 - Td or Tdap) 07/28/2027   Pneumococcal Vaccine: 50+ Years  Completed   Hepatitis C Screening  Completed   Meningococcal B Vaccine  Aged Out   COVID-19 Vaccine  Discontinued        Assessment/Plan:  This is a routine wellness examination for Wen.  Patient Care Team: Vicci Duwaine SQUIBB, DO as PCP - General (Family Medicine) Darliss Rogue, MD as PCP - Cardiology (Cardiology) Georgina Shasta POUR, RN as Oncology Nurse Navigator Jacobo, Evalene PARAS, MD as Consulting Physician (Oncology) Isaiah Scrivener, MD as Consulting Physician (Pulmonary Disease) Therisa Bi, MD as  Consulting Physician (Gastroenterology)  I have personally reviewed and noted the following in the patients chart:   Medical and social history Use of alcohol, tobacco or illicit drugs  Current medications and supplements including opioid prescriptions. Functional ability and status Nutritional status Physical activity Advanced directives List of other physicians Hospitalizations, surgeries, and ER visits in previous 12 months Vitals Screenings to include cognitive, depression, and falls Referrals and appointments  No orders of the defined types were placed in this encounter.  In addition, I have reviewed and discussed with patient certain preventive protocols, quality metrics, and best practice recommendations. A written personalized care plan for preventive services as well as general preventive health recommendations were provided to patient.   Vina Ned, CMA   01/12/2024   Return in 1 year (on 01/15/2025) for Medicare Annual Wellness Visit.  After Visit Summary: (Mail) Due to this being a telephonic visit, the after visit summary with patients personalized plan was offered to patient via mail   Nurse Notes:  Needs routine eye exam. Included a list of eye doctors  in AVS Plans to get the flu vaccine at next OV on 01/30/24 Needs Shingles vaccines (pharmacy)

## 2024-01-30 ENCOUNTER — Encounter: Payer: Self-pay | Admitting: Family Medicine

## 2024-01-30 ENCOUNTER — Ambulatory Visit: Admitting: Family Medicine

## 2024-01-30 VITALS — BP 112/78 | HR 82 | Temp 97.4°F | Ht 66.0 in | Wt 211.8 lb

## 2024-01-30 DIAGNOSIS — F332 Major depressive disorder, recurrent severe without psychotic features: Secondary | ICD-10-CM

## 2024-01-30 DIAGNOSIS — R0683 Snoring: Secondary | ICD-10-CM | POA: Diagnosis not present

## 2024-01-30 DIAGNOSIS — E785 Hyperlipidemia, unspecified: Secondary | ICD-10-CM

## 2024-01-30 DIAGNOSIS — C50911 Malignant neoplasm of unspecified site of right female breast: Secondary | ICD-10-CM | POA: Diagnosis not present

## 2024-01-30 DIAGNOSIS — Z0000 Encounter for general adult medical examination without abnormal findings: Secondary | ICD-10-CM | POA: Diagnosis not present

## 2024-01-30 DIAGNOSIS — Z23 Encounter for immunization: Secondary | ICD-10-CM | POA: Diagnosis not present

## 2024-01-30 DIAGNOSIS — E559 Vitamin D deficiency, unspecified: Secondary | ICD-10-CM | POA: Diagnosis not present

## 2024-01-30 DIAGNOSIS — I700 Atherosclerosis of aorta: Secondary | ICD-10-CM

## 2024-01-30 DIAGNOSIS — J449 Chronic obstructive pulmonary disease, unspecified: Secondary | ICD-10-CM | POA: Diagnosis not present

## 2024-01-30 DIAGNOSIS — E66811 Obesity, class 1: Secondary | ICD-10-CM | POA: Diagnosis not present

## 2024-01-30 DIAGNOSIS — R5382 Chronic fatigue, unspecified: Secondary | ICD-10-CM

## 2024-01-30 DIAGNOSIS — D508 Other iron deficiency anemias: Secondary | ICD-10-CM | POA: Diagnosis not present

## 2024-01-30 LAB — BAYER DCA HB A1C WAIVED: HB A1C (BAYER DCA - WAIVED): 5 % (ref 4.8–5.6)

## 2024-01-30 MED ORDER — PANTOPRAZOLE SODIUM 40 MG PO TBEC: 40.0000 mg | DELAYED_RELEASE_TABLET | Freq: Every day | ORAL | 3 refills | Status: AC

## 2024-01-30 MED ORDER — BUPROPION HCL ER (XL) 300 MG PO TB24: ORAL_TABLET | ORAL | 1 refills | Status: AC

## 2024-01-30 MED ORDER — ATORVASTATIN CALCIUM 20 MG PO TABS: 20.0000 mg | ORAL_TABLET | Freq: Every day | ORAL | 1 refills | Status: AC

## 2024-01-30 MED ORDER — TRAZODONE HCL 300 MG PO TABS: ORAL_TABLET | ORAL | 1 refills | Status: AC

## 2024-01-30 NOTE — Patient Instructions (Addendum)
 You are scheduled to see Dr. Isaiah at [facility] on Wednesday 02/29/24 at 11:30 AM.   [facility] at [facility] [address] [place],  [place]  [address]   Main: [phone]

## 2024-01-30 NOTE — Progress Notes (Signed)
 "  BP 112/78   Pulse 82   Temp (!) 97.4 F (36.3 C) (Oral)   Ht 5' 6 (1.676 m)   Wt 211 lb 12.8 oz (96.1 kg)   LMP  (LMP Unknown)   SpO2 95%   BMI 34.19 kg/m    Subjective:    Patient ID: Chelsey [name], female    DOB: 04-10-1950, 74 y.o.   MRN: [id number]  HPI: Chelsey [name] is a 74 y.o. female presenting on 01/30/2024 for comprehensive medical examination. Current medical complaints include:  HYPERTENSION / HYPERLIPIDEMIA Satisfied with current treatment? yes Duration of hypertension: chronic BP monitoring frequency: not checking BP medication side effects: no Past BP meds: none Duration of hyperlipidemia: chronic Cholesterol medication side effects: no Cholesterol supplements: none Past cholesterol medications: atorvastatin  Medication compliance: excellent compliance Aspirin : yes Recent stressors: no Recurrent headaches: no Visual changes: no Palpitations: no Dyspnea: yes Chest pain: no Lower extremity edema: no Dizzy/lightheaded: no  DEPRESSION Mood status: stable Satisfied with current treatment?: yes Symptom severity: mild  Duration of current treatment : chronic Side effects: no Medication compliance: excellent compliance Psychotherapy/counseling: no  Previous psychiatric medications: buspar , trazodone  Depressed mood: no Anxious mood: yes Anhedonia: no Significant weight loss or gain: no Insomnia: no  Fatigue: yes Feelings of worthlessness or guilt: no Impaired concentration/indecisiveness: no Suicidal ideations: no Hopelessness: no Crying spells: no    01/12/2024   12:51 PM 11/07/2023   10:24 AM 10/18/2023    2:31 PM 04/28/2023    9:05 AM 01/28/2023    9:33 AM  Depression screen PHQ 2/9  Decreased Interest 0 0 0 0 0  Down, Depressed, Hopeless 0 0 0 0 0  PHQ - 2 Score 0 0 0 0 0  Altered sleeping 0 0 1 1 0  Tired, decreased energy 3 0 2 3 0  Change in appetite 0 0 0 0 3  Feeling bad or failure about yourself  0 0 0 0 0  Trouble  concentrating 0 0 0 0 0  Moving slowly or fidgety/restless 0 0 0 0 0  Suicidal thoughts 0 0 0 0 0  PHQ-9 Score 3 0  3  4  3    Difficult doing work/chores Somewhat difficult   Somewhat difficult Somewhat difficult     Data saved with a previous flowsheet row definition   COPD COPD status: stable Satisfied with current treatment?: yes Oxygen use: no Dyspnea frequency: daily with exertion Cough frequency: occasionally Rescue inhaler frequency:  occasionally Limitation of activity: yes Productive cough: no Pneumovax: Up to Date Influenza: Up to Date  ???SLEEP APNEA Sleep apnea status: unsure Duration: chronic Satisfied with current treatment?:  N/A Last sleep study: never Wakes feeling refreshed:  no Daytime hypersomnolence:  yes Fatigue:  yes Insomnia:  no Good sleep hygiene:  yes Difficulty falling asleep:  no Difficulty staying asleep:  no Snoring bothers bed partner:  yes Observed apnea by bed partner: no Obesity:  yes Hypertension: yes  Pulmonary hypertension:  no Coronary artery disease:  no    06/01/2023    9:00 AM  Results of the Epworth flowsheet  Sitting and reading 0  Watching TV 1  Sitting, inactive in a public place (e.g. a theatre or a meeting) 0  As a passenger in a car for an hour without a break 0  Lying down to rest in the afternoon when circumstances permit 2  Sitting and talking to someone 0  Sitting quietly after a lunch without alcohol 0  In  a car, while stopped for a few minutes in traffic 0  Total score 3    She currently lives with: son Menopausal Symptoms: no  Depression Screen done today and results listed below:     01/12/2024   12:51 PM 11/07/2023   10:24 AM 10/18/2023    2:31 PM 04/28/2023    9:05 AM 01/28/2023    9:33 AM  Depression screen PHQ 2/9  Decreased Interest 0 0 0 0 0  Down, Depressed, Hopeless 0 0 0 0 0  PHQ - 2 Score 0 0 0 0 0  Altered sleeping 0 0 1 1 0  Tired, decreased energy 3 0 2 3 0  Change in appetite 0 0 0 0  3  Feeling bad or failure about yourself  0 0 0 0 0  Trouble concentrating 0 0 0 0 0  Moving slowly or fidgety/restless 0 0 0 0 0  Suicidal thoughts 0 0 0 0 0  PHQ-9 Score 3 0  3  4  3    Difficult doing work/chores Somewhat difficult   Somewhat difficult Somewhat difficult     Data saved with a previous flowsheet row definition     Past Medical History:  Past Medical History:  Diagnosis Date   Aortic atherosclerosis    a. Noted on CT abd 08/2019.   Breast cancer [facility]) 1997   right breast, radiation   Bronchitis    recent/ 06/09/15 had chest xray/ [facility]/resolved   Cancer [facility]) 1997   right lumpectomy,L/Ad/R    Coronary artery calcification seen on CT scan    a. 04/2023 MV: Minimal coronary calcifications noted on CT correction imaging of stress test.   Dyspnea on exertion    a. 03/2023 Echo: EF 60-65%, GrI DD, mod MR; b. 04/2023 MV: EF>65%, no isch/infarct, minimal cor Ca2+.   GERD (gastroesophageal reflux disease)    History of hiatal hernia    Hyperlipidemia    Low BP    a. typically 80's/60's   Moderate mitral regurgitation    a. 03/2023 Echo: EF 60-65%, no rwma, GrI DD, nl RV size/fxn, mod dil LA, mod MR, AoV sclerosis w/o stenosis.   Osteopenia    Panic attack    Paraesophageal hernia    Personal history of malignant neoplasm of breast    Personal history of radiation therapy     Surgical History:  Past Surgical History:  Procedure Laterality Date   BICEPT TENODESIS Left 11/23/2016   Procedure: BICEPS TENODESIS;  Surgeon: Tobie Priest, MD;  Location: [facility];  Service: Orthopedics;  Laterality: Left;   BREAST BIOPSY Right 12/21/2021   MM RT RADIO FREQUENCY TAG LOC MAMMO GUIDE 12/21/2021 [facility]   BREAST EXCISIONAL BIOPSY Right 1997   pos   BREAST LUMPECTOMY Right 12/25/2021   breast cancer   BREAST SURGERY Right 1997   lumpectomy   CHOLECYSTECTOMY N/A 08/06/2016   Procedure: LAPAROSCOPIC CHOLECYSTECTOMY WITH INTRAOPERATIVE CHOLANGIOGRAM;   Surgeon: Dellie Louanne MATSU, MD;  Location: [facility];  Service: General;  Laterality: N/A;   COLONOSCOPY  2008   COLONOSCOPY WITH PROPOFOL  N/A 07/21/2015   Procedure: COLONOSCOPY WITH PROPOFOL ;  Surgeon: Rogelia Copping, MD;  Location: [facility];  Service: Endoscopy;  Laterality: N/A;   COLONOSCOPY WITH PROPOFOL  N/A 01/05/2018   Procedure: COLONOSCOPY WITH PROPOFOL ;  Surgeon: Therisa Bi, MD;  Location: [facility] END[facility]COPY;  Service: Gastroenterology;  Laterality: N/A;   COLONOSCOPY WITH PROPOFOL  N/A 03/23/2023   Procedure: COLONOSCOPY WITH PROPOFOL ;  Surgeon: Therisa Bi, MD;  Location: [facility] ENDOSCOPY;  Service: Gastroenterology;  Laterality: N/A;   ESOPHAGOGASTRODUODENOSCOPY (EGD) WITH PROPOFOL  N/A 06/16/2016   Procedure: ESOPHAGOGASTRODUODENOSCOPY (EGD) WITH PROPOFOL ;  Surgeon: Dellie Louanne MATSU, MD;  Location: [facility];  Service: Endoscopy;  Laterality: N/A;   ESOPHAGOGASTRODUODENOSCOPY (EGD) WITH PROPOFOL  N/A 01/05/2018   Procedure: ESOPHAGOGASTRODUODENOSCOPY (EGD) WITH PROPOFOL ;  Surgeon: Therisa Bi, MD;  Location: [facility] ENDOSCOPY;  Service: Gastroenterology;  Laterality: N/A;   ESOPHAGOGASTRODUODENOSCOPY (EGD) WITH PROPOFOL  N/A 06/13/2018   Procedure: ESOPHAGOGASTRODUODENOSCOPY (EGD) WITH PROPOFOL ;  Surgeon: Therisa Bi, MD;  Location: [facility] ENDOSCOPY;  Service: Gastroenterology;  Laterality: N/A;   ESOPHAGOGASTRODUODENOSCOPY (EGD) WITH PROPOFOL  N/A 07/16/2018   Procedure: ESOPHAGOGASTRODUODENOSCOPY (EGD) WITH PROPOFOL ;  Surgeon: Jinny Carmine, MD;  Location: [facility] ENDOSCOPY;  Service: Endoscopy;  Laterality: N/A;   HARDWARE REMOVAL Left 12/27/2016   Procedure: HARDWARE REMOVAL LEFT  PROXIMAL HUMEROUS;  Surgeon: Tobie Priest, MD;  Location: [facility];  Service: Orthopedics;  Laterality: Left;   IR GJ TUBE CHANGE  04/21/2018   NISSEN FUNDOPLICATION N/A 03/09/2018   Procedure: NISSEN FUNDOPLICATION;  Surgeon: Jordis Laneta FALCON, MD;  Location: [facility];  Service: General;  Laterality:  N/A;   ORIF HUMERUS FRACTURE Left 11/23/2016   Procedure: OPEN REDUCTION INTERNAL FIXATION (ORIF) PROXIMAL HUMERUS FRACTURE;  Surgeon: Tobie Priest, MD;  Location: [facility];  Service: Orthopedics;  Laterality: Left;   PART MASTECTOMY,RADIO FREQUENCY LOCALIZER,AXILLARY SENTINEL NODE BIOPSY Right 12/25/2021   Procedure: PART MASTECTOMY,RADIO FREQUENCY LOCALIZER,AXILLARY SENTINEL NODE BIOPSY;  Surgeon: Tye Millet, DO;  Location: [facility];  Service: General;  Laterality: Right;   POLYPECTOMY  03/23/2023   Procedure: POLYPECTOMY;  Surgeon: Therisa Bi, MD;  Location[facility] END[facility];  Service: Gastroenterology;;   REPAIR OF ESOPHAGUS  03/09/2018   Procedure: REPAIR OF ESOPHAGUS;  Surgeon: Jordis Laneta FALCON, MD;  Location: [facility];  Service: General;;   REVERSE SHOULDER ARTHROPLASTY Left 12/27/2016   Procedure: REVERSE SHOULDER ARTHROPLASTY;  Surgeon: Tobie Priest, MD;  Location: [facility];  Service: Orthopedics;  Laterality: Left;   ROBOTIC ASSISTED LAPAROSCOPIC REPAIR OF PARAESOPHAGEAL HERNIA N/A 03/09/2018   Procedure: ROBOTIC HIATAL HERNIA CONVERTED TO OPEN;  Surgeon: Jordis Laneta FALCON, MD;  Location: [facility];  Service: General;  Laterality: N/A;    Medications:  Current Outpatient Medications on File Prior to Visit  Medication Sig   albuterol  (VENTOLIN  HFA) 108 (90 Base) MCG/ACT inhaler Inhale 2 puffs into the lungs every 4 (four) hours as needed for wheezing or shortness of breath.   aspirin  EC 81 MG tablet Take 1 tablet (81 mg total) by mouth daily. Swallow whole.   busPIRone  (BUSPAR ) 10 MG tablet Take 1 tablet (10 mg total) by mouth 2 (two) times daily. (Patient taking differently: Take 10 mg by mouth 2 (two) times daily. Taking 1 tablet daily)   letrozole  (FEMARA ) 2.5 MG tablet TAKE 1 TABLET(2.5 MG) BY MOUTH DAILY   Spacer/Aero-Hold Chamber Mask MISC Use as directed   budesonide -glycopyrrolate -formoterol  (BREZTRI  AEROSPHERE) 160-9-4.8 MCG/ACT AERO inhaler Inhale 2 puffs into the lungs 2 (two)  times daily. (Patient not taking: Reported on 01/30/2024)   diclofenac  Sodium (VOLTAREN ) 1 % GEL Apply 4 g topically 4 (four) times daily. (Patient not taking: Reported on 01/30/2024)   Current Facility-Administered Medications on File Prior to Visit  Medication   lidocaine  (PF) (XYLOCAINE ) 1 % injection 10 mL    Allergies:  Allergies[1]  Social History:  Social History   Socioeconomic History   Marital status: Widowed    Spouse name: Not on file   Number of children:  2   Years of education: Not on file   Highest education level: Bachelor's degree (e.g., BA, AB, BS)  Occupational History   Occupation: retired  Tobacco Use   Smoking status: Every Day    Current packs/day: 0.15    Average packs/day: 0.2 packs/day for 14.2 years (2.1 ttl pk-yrs)    Types: Cigarettes    Start date: 11/25/2009   Smokeless tobacco: Never   Tobacco comments:    2 cigarette daily  Vaping Use   Vaping status: Never Used  Substance and Sexual Activity   Alcohol use: No    Alcohol/week: 0.0 standard drinks of alcohol   Drug use: No   Sexual activity: Never  Other Topics Concern   Not on file  Social History Narrative   Lives with son.  Does not routinely exercise.   Social Drivers of Health   Tobacco Use: High Risk (01/30/2024)   Patient History    Smoking Tobacco Use: Every Day    Smokeless Tobacco Use: Never    Passive Exposure: Not on file  Financial Resource Strain: Low Risk (01/06/2023)   Overall Financial Resource Strain (CARDIA)    Difficulty of Paying Living Expenses: Not very hard  Food Insecurity: No Food Insecurity (01/12/2024)   Epic    Worried About Programme Researcher, Broadcasting/film/video in the Last Year: Never true    Ran Out of Food in the Last Year: Never true  Transportation Needs: No Transportation Needs (01/12/2024)   Epic    Lack of Transportation (Medical): No    Lack of Transportation (Non-Medical): No  Physical Activity: Inactive (01/12/2024)   Exercise Vital Sign    Days of Exercise  per Week: 0 days    Minutes of Exercise per Session: 0 min  Stress: No Stress Concern Present (01/12/2024)   [employer] of Occupational Health - Occupational Stress Questionnaire    Feeling of Stress: Only a little  Social Connections: Socially Isolated (01/12/2024)   Social Connection and Isolation Panel    Frequency of Communication with Friends and Family: More than three times a week    Frequency of Social Gatherings with Friends and Family: More than three times a week    Attends Religious Services: Never    Database Administrator or Organizations: No    Attends Banker Meetings: Never    Marital Status: Widowed  Intimate Partner Violence: Not At Risk (01/12/2024)   Epic    Fear of Current or Ex-Partner: No    Emotionally Abused: No    Physically Abused: No    Sexually Abused: No  Depression (PHQ2-9): Low Risk (01/12/2024)   Depression (PHQ2-9)    PHQ-2 Score: 3  Alcohol Screen: Low Risk (01/06/2023)   Alcohol Screen    Last Alcohol Screening Score (AUDIT): 0  Housing: Low Risk (01/12/2024)   Epic    Unable to Pay for Housing in the Last Year: No    Number of Times Moved in the Last Year: 0    Homeless in the Last Year: No  Utilities: Not At Risk (01/12/2024)   Epic    Threatened with loss of utilities: No  Health Literacy: Adequate Health Literacy (01/12/2024)   B1300 Health Literacy    Frequency of need for help with medical instructions: Never   Tobacco Use History[2] Social History   Substance and Sexual Activity  Alcohol Use No   Alcohol/week: 0.0 standard drinks of alcohol    Family History:  Family History  Problem Relation  Age of Onset   Kidney disease Mother    Cancer Father        stomach   Heart attack Father    Hypertension Father    Alcohol abuse Father    Anxiety disorder Brother    Obesity Brother    COPD Brother    Depression Brother    Anxiety disorder Brother    Cancer Maternal Grandmother    Breast cancer Maternal  Grandmother     Past medical history, surgical history, medications, allergies, family history and social history reviewed with patient today and changes made to appropriate areas of the chart.   Review of Systems  Constitutional:  Positive for malaise/fatigue. Negative for chills, diaphoresis and weight loss.  Eyes: Negative.   Respiratory:  Positive for shortness of breath. Negative for cough, hemoptysis, sputum production and wheezing.   Cardiovascular:  Positive for chest pain. Negative for palpitations, orthopnea, claudication, leg swelling and PND.  Gastrointestinal:  Positive for constipation, diarrhea and nausea. Negative for abdominal pain, blood in stool, heartburn, melena and vomiting.  Genitourinary: Negative.   Musculoskeletal:  Positive for joint pain. Negative for back pain, falls, myalgias and neck pain.  Neurological:  Positive for dizziness (first thing in the AM). Negative for tingling, tremors, sensory change, speech change, focal weakness, seizures, loss of consciousness, weakness and headaches.  Endo/Heme/Allergies:  Positive for polydipsia. Negative for environmental allergies. Bruises/bleeds easily.  Psychiatric/Behavioral:  Negative for depression, hallucinations, memory loss, substance abuse and suicidal ideas. The patient is nervous/anxious. The patient does not have insomnia.    All other ROS negative except what is listed above and in the HPI.      Objective:    BP 112/78   Pulse 82   Temp (!) 97.4 F (36.3 C) (Oral)   Ht 5' 6 (1.676 m)   Wt 211 lb 12.8 oz (96.1 kg)   LMP  (LMP Unknown)   SpO2 95%   BMI 34.19 kg/m   Wt Readings from Last 3 Encounters:  01/30/24 211 lb 12.8 oz (96.1 kg)  01/12/24 200 lb (90.7 kg)  11/07/23 213 lb 15.7 oz (97.1 kg)    Physical Exam Vitals and nursing note reviewed.  Constitutional:      General: She is not in acute distress.    Appearance: Normal appearance. She is obese. She is not ill-appearing, toxic-appearing  or diaphoretic.  HENT:     Head: Normocephalic and atraumatic.     Right Ear: Tympanic membrane, ear canal and external ear normal. There is no impacted cerumen.     Left Ear: Tympanic membrane, ear canal and external ear normal. There is no impacted cerumen.     Nose: Nose normal. No congestion or rhinorrhea.     Mouth/Throat:     Mouth: Mucous membranes are moist.     Pharynx: Oropharynx is clear. No oropharyngeal exudate or posterior oropharyngeal erythema.  Eyes:     General: No scleral icterus.       Right eye: No discharge.        Left eye: No discharge.     Extraocular Movements: Extraocular movements intact.     Conjunctiva/sclera: Conjunctivae normal.     Pupils: Pupils are equal, round, and reactive to light.  Neck:     Vascular: No carotid bruit.  Cardiovascular:     Rate and Rhythm: Normal rate and regular rhythm.     Pulses: Normal pulses.     Heart sounds: No murmur heard.    No  friction rub. No gallop.  Pulmonary:     Effort: Pulmonary effort is normal. No respiratory distress.     Breath sounds: No stridor. Wheezing present. No rhonchi or rales.  Chest:     Chest wall: No tenderness.  Abdominal:     General: Abdomen is flat. Bowel sounds are normal. There is no distension.     Palpations: Abdomen is soft. There is no mass.     Tenderness: There is no abdominal tenderness. There is no right CVA tenderness, left CVA tenderness, guarding or rebound.     Hernia: No hernia is present.  Genitourinary:    Comments: Breast and pelvic exams deferred with shared decision making Musculoskeletal:        General: No swelling, tenderness, deformity or signs of injury.     Cervical back: Normal range of motion and neck supple. No rigidity. No muscular tenderness.     Right lower leg: No edema.     Left lower leg: No edema.  Lymphadenopathy:     Cervical: No cervical adenopathy.  Skin:    General: Skin is warm and dry.     Capillary Refill: Capillary refill takes less than  2 seconds.     Coloration: Skin is not jaundiced or pale.     Findings: No bruising, erythema, lesion or rash.  Neurological:     General: No focal deficit present.     Mental Status: She is alert and oriented to person, place, and time. Mental status is at baseline.     Cranial Nerves: No cranial nerve deficit.     Sensory: No sensory deficit.     Motor: No weakness.     Coordination: Coordination normal.     Gait: Gait normal.     Deep Tendon Reflexes: Reflexes normal.  Psychiatric:        Mood and Affect: Mood normal.        Behavior: Behavior normal.        Thought Content: Thought content normal.        Judgment: Judgment normal.     Results for orders placed or performed in visit on 01/30/24  Bayer DCA Hb A1c Waived   Collection Time: 01/30/24  1:29 PM  Result Value Ref Range   HB A1C (BAYER DCA - WAIVED) 5.0 4.8 - 5.6 %  CBC with Differential/Platelet   Collection Time: 01/30/24  1:30 PM  Result Value Ref Range   WBC 4.7 3.4 - 10.8 x10E3/uL   RBC 4.55 3.77 - 5.28 x10E6/uL   Hemoglobin 13.6 11.1 - 15.9 g/dL   Hematocrit 57.5 65.9 - 46.6 %   MCV 93 79 - 97 fL   MCH 29.9 26.6 - 33.0 pg   MCHC 32.1 31.5 - 35.7 g/dL   RDW 86.5 88.2 - 84.5 %   Platelets 219 150 - 450 x10E3/uL   Neutrophils 66 Not Estab. %   Lymphs 23 Not Estab. %   Monocytes 7 Not Estab. %   Eos 2 Not Estab. %   Basos 1 Not Estab. %   Neutrophils Absolute 3.1 1.4 - 7.0 x10E3/uL   Lymphocytes Absolute 1.1 0.7 - 3.1 x10E3/uL   Monocytes Absolute 0.3 0.1 - 0.9 x10E3/uL   EOS (ABSOLUTE) 0.1 0.0 - 0.4 x10E3/uL   Basophils Absolute 0.0 0.0 - 0.2 x10E3/uL   Immature Granulocytes 1 Not Estab. %   Immature Grans (Abs) 0.1 0.0 - 0.1 x10E3/uL  Comprehensive metabolic panel with GFR   Collection Time: 01/30/24  1:30  PM  Result Value Ref Range   Glucose 106 (H) 70 - 99 mg/dL   BUN 13 8 - 27 mg/dL   Creatinine, Ser 9.16 0.57 - 1.00 mg/dL   eGFR 75 >40 fO/fpw/8.26   BUN/Creatinine Ratio 16 12 - 28   Sodium  138 134 - 144 mmol/L   Potassium 4.6 3.5 - 5.2 mmol/L   Chloride 103 96 - 106 mmol/L   CO2 22 20 - 29 mmol/L   Calcium  9.3 8.7 - 10.3 mg/dL   Total Protein 6.2 6.0 - 8.5 g/dL   Albumin  4.1 3.8 - 4.8 g/dL   Globulin, Total 2.1 1.5 - 4.5 g/dL   Bilirubin Total 0.5 0.0 - 1.2 mg/dL   Alkaline Phosphatase 115 49 - 135 IU/L   AST 14 0 - 40 IU/L   ALT 11 0 - 32 IU/L  Lipid Panel w/o Chol/HDL Ratio   Collection Time: 01/30/24  1:30 PM  Result Value Ref Range   Cholesterol, Total 169 100 - 199 mg/dL   Triglycerides 882 0 - 149 mg/dL   HDL 51 >60 mg/dL   VLDL Cholesterol Cal 21 5 - 40 mg/dL   LDL Chol Calc (NIH) 97 0 - 99 mg/dL  TSH   Collection Time: 01/30/24  1:30 PM  Result Value Ref Range   TSH 2.450 0.450 - 4.500 uIU/mL  VITAMIN D  25 Hydroxy (Vit-D Deficiency, Fractures)   Collection Time: 01/30/24  1:30 PM  Result Value Ref Range   Vit D, 25-Hydroxy 32.6 30.0 - 100.0 ng/mL  Ferritin   Collection Time: 01/30/24  1:30 PM  Result Value Ref Range   Ferritin 51 15 - 150 ng/mL  Iron Binding Cap (TIBC)([facility])   Collection Time: 01/30/24  1:30 PM  Result Value Ref Range   Total Iron Binding Capacity 293 250 - 450 ug/dL   UIBC 768 881 - 630 ug/dL   Iron 62 27 - 860 ug/dL   Iron Saturation 21 15 - 55 %      Assessment & Plan:   Problem List Items Addressed This Visit       Cardiovascular and Mediastinum   Aortic atherosclerosis   Will keep BP and cholesterol under good control. Continue to monitor. Call with any concerns.       Relevant Medications   atorvastatin  (LIPITOR) 20 MG tablet   Other Relevant Orders   CBC with Differential/Platelet (Completed)   Comprehensive metabolic panel with GFR (Completed)     Respiratory   Chronic obstructive pulmonary disease (HCC)   Under good control on current regimen. Continue current regimen. Continue to monitor. Call with any concerns. Refills given.        Relevant Orders   CBC with Differential/Platelet  (Completed)   Comprehensive metabolic panel with GFR (Completed)     Other   Anemia   Rechecking labs today. Await results. Treat as needed.       Relevant Orders   CBC with Differential/Platelet (Completed)   Comprehensive metabolic panel with GFR (Completed)   Ferritin (Completed)   Iron Binding Cap (TIBC)([facility]) (Completed)   Major depressive disorder, recurrent severe without psychotic features (HCC)   Under good control on current regimen. Continue current regimen. Continue to monitor. Call with any concerns. Refills given.        Relevant Medications   buPROPion  (WELLBUTRIN  XL) 300 MG 24 hr tablet   trazodone  (DESYREL ) 300 MG tablet   Other Relevant Orders   CBC with Differential/Platelet (Completed)   Comprehensive metabolic  panel with GFR (Completed)   Hyperlipidemia   Under good control on current regimen. Continue current regimen. Continue to monitor. Call with any concerns. Refills given. Labs drawn today.        Relevant Medications   atorvastatin  (LIPITOR) 20 MG tablet   Other Relevant Orders   CBC with Differential/Platelet (Completed)   Comprehensive metabolic panel with GFR (Completed)   Lipid Panel w/o Chol/HDL Ratio (Completed)   Invasive ductal carcinoma of right breast in female [facility])   Continue letrozole . Continue to follow with oncology. Call with any concerns. Continue to monitor.       Vitamin D  deficiency   Rechecking labs today. Await results. Treat as needed.       Relevant Orders   CBC with Differential/Platelet (Completed)   Comprehensive metabolic panel with GFR (Completed)   VITAMIN D  25 Hydroxy (Vit-D Deficiency, Fractures) (Completed)   Obesity (BMI 30.0-34.9)   Encouraged diet and exercise. Call with any concerns. Continue to monitor.       Relevant Orders   CBC with Differential/Platelet (Completed)   Comprehensive metabolic panel with GFR (Completed)   TSH (Completed)   Bayer DCA Hb A1c Waived (Completed)   Other  Visit Diagnoses       Snoring    -  Primary   Will get set up for sleep study. Await results.   Relevant Orders   Ambulatory referral to Sleep Studies   CBC with Differential/Platelet (Completed)   Comprehensive metabolic panel with GFR (Completed)     Routine general medical examination at a health care facility       Vaccines up to date. Screening labs checked today. Mammo and DEXA up to date/scheduled. Colonoscopy up to date. Continue diet and exercise. Call w any concerns.     Chronic fatigue       Will recheck labs. Cardiac work up negative. Due for follow up with pulmonology. Will arrange sleep study. Call with any concerns.   Relevant Orders   Ambulatory referral to Sleep Studies   CBC with Differential/Platelet (Completed)   Comprehensive metabolic panel with GFR (Completed)   TSH (Completed)        Follow up plan: Return in about 3 months (around 04/29/2024).   LABORATORY TESTING:  - Pap smear: not applicable  IMMUNIZATIONS:   - Tdap: Tetanus vaccination status reviewed: last tetanus booster within 10 years. - Influenza: Up to date - Pneumovax: Up to date - Prevnar: Up to date - COVID: Refused - HPV: Not applicable - Shingrix  vaccine: Refused  SCREENING: -Mammogram: Up to date  - Colonoscopy: Up to date  - Bone Density: Scheduled   PATIENT COUNSELING:   Advised to take 1 mg of folate supplement per day if capable of pregnancy.   Sexuality: Discussed sexually transmitted diseases, partner selection, use of condoms, avoidance of unintended pregnancy  and contraceptive alternatives.   Advised to avoid cigarette smoking.  I discussed with the patient that most people either abstain from alcohol or drink within safe limits (<=14/week and <=4 drinks/occasion for males, <=7/weeks and <= 3 drinks/occasion for females) and that the risk for alcohol disorders and other health effects rises proportionally with the number of drinks per week and how often a drinker exceeds  daily limits.  Discussed cessation/primary prevention of drug use and availability of treatment for abuse.   Diet: Encouraged to adjust caloric intake to maintain  or achieve ideal body weight, to reduce intake of dietary saturated fat and total fat, to limit sodium  intake by avoiding high sodium foods and not adding table salt, and to maintain adequate dietary potassium and calcium  preferably from fresh fruits, vegetables, and low-fat dairy products.    stressed the importance of regular exercise  Injury prevention: Discussed safety belts, safety helmets, smoke detector, smoking near bedding or upholstery.   Dental health: Discussed importance of regular tooth brushing, flossing, and dental visits.    NEXT PREVENTATIVE PHYSICAL DUE IN 1 YEAR. Return in about 3 months (around 04/29/2024).              [1] No Known Allergies [2]  Social History Tobacco Use  Smoking Status Every Day   Current packs/day: 0.15   Average packs/day: 0.2 packs/day for 14.2 years (2.1 ttl pk-yrs)   Types: Cigarettes   Start date: 11/25/2009  Smokeless Tobacco Never  Tobacco Comments   2 cigarette daily   "

## 2024-01-31 LAB — COMPREHENSIVE METABOLIC PANEL WITH GFR
ALT: 11 IU/L (ref 0–32)
AST: 14 IU/L (ref 0–40)
Albumin: 4.1 g/dL (ref 3.8–4.8)
Alkaline Phosphatase: 115 IU/L (ref 49–135)
BUN/Creatinine Ratio: 16 (ref 12–28)
BUN: 13 mg/dL (ref 8–27)
Bilirubin Total: 0.5 mg/dL (ref 0.0–1.2)
CO2: 22 mmol/L (ref 20–29)
Calcium: 9.3 mg/dL (ref 8.7–10.3)
Chloride: 103 mmol/L (ref 96–106)
Creatinine, Ser: 0.83 mg/dL (ref 0.57–1.00)
Globulin, Total: 2.1 g/dL (ref 1.5–4.5)
Glucose: 106 mg/dL — ABNORMAL HIGH (ref 70–99)
Potassium: 4.6 mmol/L (ref 3.5–5.2)
Sodium: 138 mmol/L (ref 134–144)
Total Protein: 6.2 g/dL (ref 6.0–8.5)
eGFR: 75 mL/min/1.73

## 2024-01-31 LAB — CBC WITH DIFFERENTIAL/PLATELET
Basophils Absolute: 0 x10E3/uL (ref 0.0–0.2)
Basos: 1 %
EOS (ABSOLUTE): 0.1 x10E3/uL (ref 0.0–0.4)
Eos: 2 %
Hematocrit: 42.4 % (ref 34.0–46.6)
Hemoglobin: 13.6 g/dL (ref 11.1–15.9)
Immature Grans (Abs): 0.1 x10E3/uL (ref 0.0–0.1)
Immature Granulocytes: 1 %
Lymphocytes Absolute: 1.1 x10E3/uL (ref 0.7–3.1)
Lymphs: 23 %
MCH: 29.9 pg (ref 26.6–33.0)
MCHC: 32.1 g/dL (ref 31.5–35.7)
MCV: 93 fL (ref 79–97)
Monocytes Absolute: 0.3 x10E3/uL (ref 0.1–0.9)
Monocytes: 7 %
Neutrophils Absolute: 3.1 x10E3/uL (ref 1.4–7.0)
Neutrophils: 66 %
Platelets: 219 x10E3/uL (ref 150–450)
RBC: 4.55 x10E6/uL (ref 3.77–5.28)
RDW: 13.4 % (ref 11.7–15.4)
WBC: 4.7 x10E3/uL (ref 3.4–10.8)

## 2024-01-31 LAB — LIPID PANEL W/O CHOL/HDL RATIO
Cholesterol, Total: 169 mg/dL (ref 100–199)
HDL: 51 mg/dL
LDL Chol Calc (NIH): 97 mg/dL (ref 0–99)
Triglycerides: 117 mg/dL (ref 0–149)
VLDL Cholesterol Cal: 21 mg/dL (ref 5–40)

## 2024-01-31 LAB — IRON AND TIBC
Iron Saturation: 21 % (ref 15–55)
Iron: 62 ug/dL (ref 27–139)
Total Iron Binding Capacity: 293 ug/dL (ref 250–450)
UIBC: 231 ug/dL (ref 118–369)

## 2024-01-31 LAB — TSH: TSH: 2.45 u[IU]/mL (ref 0.450–4.500)

## 2024-01-31 LAB — VITAMIN D 25 HYDROXY (VIT D DEFICIENCY, FRACTURES): Vit D, 25-Hydroxy: 32.6 ng/mL (ref 30.0–100.0)

## 2024-01-31 LAB — FERRITIN: Ferritin: 51 ng/mL (ref 15–150)

## 2024-01-31 NOTE — Assessment & Plan Note (Signed)
 Under good control on current regimen. Continue current regimen. Continue to monitor. Call with any concerns. Refills given.

## 2024-01-31 NOTE — Assessment & Plan Note (Signed)
Encouraged diet and exercise. Call with any concerns. Continue to monitor.  

## 2024-01-31 NOTE — Assessment & Plan Note (Signed)
 Will keep BP and cholesterol under good control. Continue to monitor. Call with any concerns.

## 2024-01-31 NOTE — Assessment & Plan Note (Signed)
 Continue letrozole . Continue to follow with oncology. Call with any concerns. Continue to monitor.

## 2024-01-31 NOTE — Assessment & Plan Note (Signed)
 Rechecking labs today. Await results. Treat as needed.

## 2024-01-31 NOTE — Assessment & Plan Note (Signed)
 Under good control on current regimen. Continue current regimen. Continue to monitor. Call with any concerns. Refills given. Labs drawn today.

## 2024-02-06 ENCOUNTER — Ambulatory Visit: Payer: Self-pay | Admitting: Family Medicine

## 2024-02-13 ENCOUNTER — Ambulatory Visit
Admission: RE | Admit: 2024-02-13 | Discharge: 2024-02-13 | Disposition: A | Discharge status: Home / Self Care | Source: Ambulatory Visit | Attending: Internal Medicine | Admitting: Internal Medicine

## 2024-02-13 DIAGNOSIS — R918 Other nonspecific abnormal finding of lung field: Secondary | ICD-10-CM | POA: Diagnosis present

## 2024-02-29 ENCOUNTER — Ambulatory Visit: Admitting: Internal Medicine

## 2024-02-29 ENCOUNTER — Encounter: Payer: Self-pay | Admitting: Internal Medicine

## 2024-02-29 VITALS — BP 120/80 | HR 86 | Temp 98.8°F | Ht 65.0 in | Wt 212.8 lb

## 2024-02-29 DIAGNOSIS — Z6835 Body mass index (BMI) 35.0-35.9, adult: Secondary | ICD-10-CM | POA: Diagnosis not present

## 2024-02-29 DIAGNOSIS — R0609 Other forms of dyspnea: Secondary | ICD-10-CM | POA: Diagnosis not present

## 2024-02-29 DIAGNOSIS — E669 Obesity, unspecified: Secondary | ICD-10-CM | POA: Diagnosis not present

## 2024-02-29 DIAGNOSIS — F17210 Nicotine dependence, cigarettes, uncomplicated: Secondary | ICD-10-CM | POA: Diagnosis not present

## 2024-02-29 DIAGNOSIS — J449 Chronic obstructive pulmonary disease, unspecified: Secondary | ICD-10-CM

## 2024-02-29 MED ORDER — BUDESONIDE 0.5 MG/2ML IN SUSP: 0.5000 mg | Freq: Two times a day (BID) | RESPIRATORY_TRACT | 0 refills | Status: AC

## 2024-02-29 NOTE — Progress Notes (Signed)
 "  Name: Chelsey [name] MRN: [id number] DOB: 12/07/1950     SYNOPSIS Referred to us  for SOB and assess for OSA Echo 03/2023 showed normal systolic function EF 60 to 65%, aortic sclerosis, impaired relaxation, no significant structural abnormalities to suggest etiology of shortness of breath. Referred to us  for further assessment Ambulating pulse oximetry in the office did not reveal oxygen Patient with extensive smoking history Patient with dyspnea exertion shortness of breath worsening Patient had previous history of pneumonia Patient with previous history of COVID Smokes half pack a day for the last 35 years  Pulmonary function testing reviewed  July 25, 2023 FEV1 FVC postbronchodilator ratio 73% predicted FEV1 93% percent predicted FVC 96% predicted No significant bronchodilator response TLC 111% predicted RV 130% predicted RV/TLC ratio 115% predicted DLCO 91% predicted Flow-volume loop somewhat suggests obstructive pattern in the expiratory limb    CHIEF COMPLAINT:  Follow-up assessment for COPD Follow-up assessment for tobacco abuse Follow-up assessment for sleep apnea   HISTORY OF PRESENT ILLNESS: Assessment of DOE Patient with ongoing symptoms of shortness of breath and dyspnea on exertion Patient still smoking findings are consistent with small obstructive airways disease reactive airway disease in the setting of obesity and deconditioned state  Smoking cessation strongly advised No exacerbation at this time No evidence of heart failure at this time No evidence or signs of infection at this time No respiratory distress No fevers, chills, nausea, vomiting, diarrhea No evidence of lower extremity edema No evidence hemoptysis  Patient with progressive shortness of breath but still smokes Patient unable to perform home sleep test due to the fact she did not want anything on her face I did explain to her we need to check her oxygen levels with ambulation and at  nighttime Patient had stated in the past that she did not want to use nasal cannula and does not want anything on her face however I have asked convince her to do retesting and assess her oxygen levels  Smoking Assessment and Cessation Counseling Upon further questioning, Patient smokes 1/2 pack/day I have advised patient to quit/stop smoking as soon as possible due to high risk for multiple medical problems Patient is willing to quit smoking  I have advised patient that we can assist and have options of Nicotine replacement therapy. I also advised patient on behavioral therapy and can provide oral medication therapy in conjunction with the other therapies Follow up next Office visit  for assessment of smoking cessation Smoking cessation counseling advised for 4 minutes     PAST MEDICAL HISTORY :   has a past medical history of Aortic atherosclerosis, Breast cancer (HCC) (1997), Bronchitis, Cancer (HCC) (1997), Coronary artery calcification seen on CT scan, Dyspnea on exertion, GERD (gastroesophageal reflux disease), History of hiatal hernia, Hyperlipidemia, Low BP, Moderate mitral regurgitation, Osteopenia, Panic attack, Paraesophageal hernia, Personal history of malignant neoplasm of breast, and Personal history of radiation therapy.  has a past surgical history that includes Colonoscopy (2008); Breast surgery (Right, 1997); Colonoscopy with propofol  (N/A, 07/21/2015); Breast excisional biopsy (Right, 1997); Esophagogastroduodenoscopy (egd) with propofol  (N/A, 06/16/2016); Cholecystectomy (N/A, 08/06/2016); ORIF humerus fracture (Left, 11/23/2016); Bicept tenodesis (Left, 11/23/2016); Reverse shoulder arthroplasty (Left, 12/27/2016); Hardware Removal (Left, 12/27/2016); Colonoscopy with propofol  (N/A, 01/05/2018); Esophagogastroduodenoscopy (egd) with propofol  (N/A, 01/05/2018); Robotic assisted laparoscopic repair of paraesophageal hernia (N/A, 03/09/2018); Nissen fundoplication (N/A, 03/09/2018);  Repair of esophagus (03/09/2018); IR GJ Tube Change (04/21/2018); Esophagogastroduodenoscopy (egd) with propofol  (N/A, 06/13/2018); Esophagogastroduodenoscopy (egd) with propofol  (N/A, 07/16/2018); Breast biopsy (Right,  12/21/2021); Part mastectomy,radio frequency localizer,axillary sentinel node biopsy (Right, 12/25/2021); Breast lumpectomy (Right, 12/25/2021); Colonoscopy with propofol  (N/A, 03/23/2023); and polypectomy (03/23/2023). Prior to Admission medications   Medication Sig Start Date End Date Taking? Authorizing Provider  aspirin  EC 81 MG tablet Take 1 tablet (81 mg total) by mouth daily. Swallow whole. 05/09/23   Vivienne Lonni Ingle, NP  atorvastatin  (LIPITOR) 20 MG tablet Take 1 tablet (20 mg total) by mouth daily. 01/28/23   Vicci Bouchard P, DO  buPROPion  (WELLBUTRIN  XL) 300 MG 24 hr tablet One tab Once a day 01/28/23   Vicci Bouchard P, DO  busPIRone  (BUSPAR ) 10 MG tablet Take 1 tablet (10 mg total) by mouth 2 (two) times daily. 01/28/23   Johnson, Megan P, DO  letrozole  (FEMARA ) 2.5 MG tablet TAKE 1 TABLET(2.5 MG) BY MOUTH DAILY 04/01/23   Jacobo Evalene PARAS, MD  pantoprazole  (PROTONIX ) 40 MG tablet Take 1 tablet (40 mg total) by mouth daily. 07/27/22   Johnson, Megan P, DO  trazodone  (DESYREL ) 300 MG tablet TAKE 1 TABLET(300 MG) BY MOUTH AT BEDTIME 04/28/23   Johnson, Megan P, DO  Vitamin D , Ergocalciferol , (DRISDOL ) 1.25 MG (50000 UNIT) CAPS capsule Take 1 capsule (50,000 Units total) by mouth every 7 (seven) days. 04/28/23   Vicci Bouchard P, DO   No Known Allergies  FAMILY HISTORY:  family history includes Alcohol abuse in her father; Anxiety disorder in her brother and brother; Breast cancer in her maternal grandmother; COPD in her brother; Cancer in her father and maternal grandmother; Depression in her brother; Heart attack in her father; Hypertension in her father; Kidney disease in her mother; Obesity in her brother. SOCIAL HISTORY:  reports that she has been smoking cigarettes. She  started smoking about 14 years ago. She has a 2.1 pack-year smoking history. She has never used smokeless tobacco. She reports that she does not drink alcohol and does not use drugs.  LMP  (LMP Unknown)   BP 120/80   Pulse 86   Temp 98.8 F (37.1 C)   Ht 5' 5 (1.651 m)   Wt 212 lb 12.8 oz (96.5 kg)   LMP  (LMP Unknown)   SpO2 94%   BMI 35.41 kg/m   Alert awake No respiratory distress No wheezing no rhonchi S1-S2 no murmurs No edema No focal deficits      CBC    Component Value Date/Time   WBC 4.7 01/30/2024 1330   WBC 5.6 02/10/2022 1018   RBC 4.55 01/30/2024 1330   RBC 4.53 02/10/2022 1018   HGB 13.6 01/30/2024 1330   HCT 42.4 01/30/2024 1330   PLT 219 01/30/2024 1330   MCV 93 01/30/2024 1330   MCV 86 05/31/2013 0351   MCH 29.9 01/30/2024 1330   MCH 30.2 02/10/2022 1018   MCHC 32.1 01/30/2024 1330   MCHC 32.9 02/10/2022 1018   RDW 13.4 01/30/2024 1330   RDW 15.8 (H) 05/31/2013 0351   LYMPHSABS 1.1 01/30/2024 1330   MONOABS 0.7 04/10/2018 1705   EOSABS 0.1 01/30/2024 1330   BASOSABS 0.0 01/30/2024 1330      Latest Ref Rng & Units 01/30/2024    1:30 PM 07/27/2023   11:49 AM 01/28/2023    9:31 AM  BMP  Glucose 70 - 99 mg/dL 893  890  887   BUN 8 - 27 mg/dL 13  16  13    Creatinine 0.57 - 1.00 mg/dL 9.16  8.66  9.01   BUN/Creat Ratio 12 - 28 16  12  13   Sodium 134 - 144 mmol/L 138  139  138   Potassium 3.5 - 5.2 mmol/L 4.6  4.4  4.2   Chloride 96 - 106 mmol/L 103  102  102   CO2 20 - 29 mmol/L 22  18  21    Calcium  8.7 - 10.3 mg/dL 9.3  9.3  9.2      ASSESSMENT AND PLAN SYNOPSIS  74 year old pleasant white female seen today for follow-up assessment for underlying moderate reactive airways disease with shortness of breath due to her smoking likely related to underlying COPD asthma in the setting of morbid obesity and deconditioned state  Progressive shortness of breath and dyspnea on exertion Findings suggest air trapping hyperinflation with obstructive  pattern on flow-volume loop likely suggest small obstructive airways disease and asthma Patient states she is unable to take traditional inhaler therapy Will start Pulmicort  nebulizers Avoid Allergens and Irritants Avoid secondhand smoke Avoid SICK contacts Recommend  Masking  when appropriate Recommend Keep up-to-date with vaccinations  Progressive shortness of breath or dyspnea on exertion Recommend 6-minute walk test Recommend overnight pulse oximetry  Patient unable to perform HST due to equipment on her face Risks and benefits explained to patient regarding undiagnosed and on treated sleep apnea   Smoking Assessment and Cessation Counseling CT of the chest January 2026-no significant abnormality seen  Obesity -recommend significant weight loss -recommend changing diet  Deconditioned state -Recommend increased daily activity and exercise   Lung cancer screening program not a candidate at this time since she is several years out from her breast cancer   MEDICATION ADJUSTMENTS/LABS AND TESTS ORDERED: Start Pulmicort  twice daily Smoking cessation Follow-up lung cancer screening    CURRENT MEDICATIONS REVIEWED AT LENGTH WITH PATIENT TODAY   Patient  satisfied with Plan of action and management. All questions answered   Follow up 6 months   I spent a total of 49 minutes dedicated to the care of this patient on the date of this encounter to include pre-visit review of records, face-to-face time with the patient discussing conditions above, post visit ordering of testing, clinical documentation with the electronic health record, making appropriate referrals as documented, and communicating necessary information to the patient's healthcare team.    The Patient requires high complexity decision making for assessment and support, frequent evaluation and titration of therapies, application of advanced monitoring technologies and extensive interpretation of multiple  databases.  Patient satisfied with Plan of action and management. All questions answered    Nickolas Alm Cellar, M.D.  [facility]  Medical Director [facility] Medical Director [facility] Cardio-Pulmonary Department       "

## 2024-02-29 NOTE — Patient Instructions (Signed)
 Please stop smoking  Will start Pulmicort  nebulizers twice daily  Will obtain 6-minute walk test to assess oxygen levels  Will obtain overnight oxygen testing to assess for oxygen levels at night

## 2024-02-29 NOTE — Addendum Note (Signed)
 Addended by: Phuong Hillary J on: 02/29/2024 02:12 PM   Modules accepted: Orders

## 2024-03-01 ENCOUNTER — Telehealth: Payer: Self-pay | Admitting: Internal Medicine

## 2024-03-01 NOTE — Telephone Encounter (Signed)
 LVMTCB to reschedule 6 minute walk to the morning.

## 2024-03-02 NOTE — Telephone Encounter (Signed)
 6 minute walk rescheduled to 03/20/2024 at 2:30pm, patient aware. Reminder mailed to patient. Nothing further needed

## 2024-03-07 ENCOUNTER — Ambulatory Visit

## 2024-03-15 ENCOUNTER — Ambulatory Visit: Admitting: Podiatry

## 2024-03-20 ENCOUNTER — Ambulatory Visit

## 2024-03-21 ENCOUNTER — Other Ambulatory Visit

## 2024-05-01 ENCOUNTER — Ambulatory Visit: Admitting: Family Medicine

## 2024-05-09 ENCOUNTER — Inpatient Hospital Stay: Admitting: Oncology

## 2024-06-06 ENCOUNTER — Ambulatory Visit: Admitting: Internal Medicine

## 2025-01-15 ENCOUNTER — Ambulatory Visit
